# Patient Record
Sex: Female | Born: 1935 | Race: White | Hispanic: No | Marital: Married | Smoking: Former smoker
Health system: Southern US, Community
[De-identification: ages and names within clinical notes are randomized; demographics above are authoritative.]

## PROBLEM LIST (undated history)

## (undated) DIAGNOSIS — R0609 Other forms of dyspnea: Secondary | ICD-10-CM

## (undated) DIAGNOSIS — Z800 Family history of malignant neoplasm of digestive organs: Secondary | ICD-10-CM

## (undated) DIAGNOSIS — C679 Malignant neoplasm of bladder, unspecified: Secondary | ICD-10-CM

## (undated) DIAGNOSIS — R0600 Dyspnea, unspecified: Secondary | ICD-10-CM

## (undated) DIAGNOSIS — J449 Chronic obstructive pulmonary disease, unspecified: Secondary | ICD-10-CM

## (undated) DIAGNOSIS — H409 Unspecified glaucoma: Secondary | ICD-10-CM

## (undated) DIAGNOSIS — I639 Cerebral infarction, unspecified: Secondary | ICD-10-CM

## (undated) DIAGNOSIS — Z853 Personal history of malignant neoplasm of breast: Secondary | ICD-10-CM

## (undated) DIAGNOSIS — K219 Gastro-esophageal reflux disease without esophagitis: Secondary | ICD-10-CM

## (undated) DIAGNOSIS — Z8042 Family history of malignant neoplasm of prostate: Secondary | ICD-10-CM

## (undated) DIAGNOSIS — Z8041 Family history of malignant neoplasm of ovary: Secondary | ICD-10-CM

## (undated) DIAGNOSIS — N200 Calculus of kidney: Secondary | ICD-10-CM

## (undated) DIAGNOSIS — Z8619 Personal history of other infectious and parasitic diseases: Secondary | ICD-10-CM

## (undated) DIAGNOSIS — I619 Nontraumatic intracerebral hemorrhage, unspecified: Secondary | ICD-10-CM

## (undated) DIAGNOSIS — M1990 Unspecified osteoarthritis, unspecified site: Secondary | ICD-10-CM

## (undated) DIAGNOSIS — Z8679 Personal history of other diseases of the circulatory system: Secondary | ICD-10-CM

## (undated) DIAGNOSIS — Z803 Family history of malignant neoplasm of breast: Secondary | ICD-10-CM

## (undated) HISTORY — DX: Calculus of kidney: N20.0

## (undated) HISTORY — PX: TUBAL LIGATION: SHX77

## (undated) HISTORY — DX: Cerebral infarction, unspecified: I63.9

## (undated) HISTORY — PX: CATARACT EXTRACTION W/ INTRAOCULAR LENS  IMPLANT, BILATERAL: SHX1307

## (undated) NOTE — *Deleted (*Deleted)
[place]   [phone] [phone]   Clinic Follow up Note   Patient Care Team: Janith Lima, MD as PCP - General (Internal Medicine) Stanford Breed, Denice Bors, MD as PCP - Cardiology (Cardiology) Rockwell Germany, RN as Oncology Nurse Navigator Mauro Kaufmann, RN as Oncology Nurse Navigator Truitt Merle, MD as Consulting Physician (Hematology) Fanny Skates, MD as Consulting Physician (General Surgery) Kyung Rudd, MD as Consulting Physician (Radiation Oncology) Alexis Frock, MD as Consulting Physician (Anesthesiology) Alexis Frock, MD as Consulting Physician (Urology) [name], MD as Consulting Physician (Cardiology) Rigoberto Noel, MD as Consulting Physician (Pulmonary Disease)  Date of Service:  06/22/2020  CHIEF COMPLAINT: f/u of left breast cancer  SUMMARY OF ONCOLOGIC HISTORY: Oncology History Overview Note  Cancer Staging Malignant neoplasm of upper-outer quadrant of left breast in female, estrogen receptor positive ([place]) Staging form: Breast, AJCC 8th Edition - Clinical stage from 04/01/2019: Stage IA (cT1c, cN0, cM0, G2, ER+, PR+, HER2-) - Signed by Truitt Merle, MD on 04/07/2019 - Pathologic stage from 05/04/2019: Stage Unknown (pT2, pNX, cM0, G2, ER+, PR+, HER2-) - Signed by Truitt Merle, MD on 05/27/2019    Malignant neoplasm of upper-outer quadrant of left breast in female, estrogen receptor positive ([place])  03/30/2019 Mammogram   Left diagnostic mammogram and [place] 03/30/19  IMPRESSION The 1.6cm heterogenous focal area in the left breast at 1:00-2:00 4-6cm form the nipple, corresponding to the hard palpable area in the left breast is suspicious of malignancy, particularly lobular carcinoma.     04/01/2019 Cancer Staging   Staging form: Breast, AJCC 8th Edition - Clinical stage from 04/01/2019: Stage IA (cT1c, cN0, cM0, G2, ER+, PR+, HER2-) - Signed by Truitt Merle, MD on 04/07/2019   04/01/2019 Initial Biopsy   Diagnosis 04/01/19  Breast, left, needle core biopsy, 2 o'clock, 5cmfn - INVASIVE MAMMARY CARCINOMA, SEE COMMENT. - MAMMARY CARCINOMA IN SITU.   04/01/2019 Receptors her2   The tumor cells are NEGATIVE for Her2 (1+). Estrogen Receptor: 100%, POSITIVE, STRONG STAINING INTENSITY Progesterone Receptor: 30%, POSITIVE, STRONG STAINING INTENSITY Proliferation Marker Ki67: 20%   04/03/2019 Initial Diagnosis   Malignant neoplasm of upper-outer quadrant of left breast in female, estrogen receptor positive ([facility])    Genetic Testing   Negative genetic testing. No pathogenic variants identified on the [facility] Breast Cancer STAT Panel + Common Hereditary Cancers Panel. The STAT Breast cancer panel offered by [facility] includes sequencing and rearrangement analysis for the following 9 genes:  ATM, BRCA1, BRCA2, CDH1, CHEK2, PALB2, PTEN, STK11 and TP53.  The Common Hereditary Cancers Panel offered by [facility] includes sequencing and/or deletion duplication testing of the following 47 genes: APC, ATM, AXIN2, BARD1, BMPR1A, BRCA1, BRCA2, BRIP1, CDH1, CDKN2A (p14ARF), CDKN2A (p16INK4a), CKD4, CHEK2, CTNNA1, DICER1, EPCAM (Deletion/duplication testing only), GREM1 (promoter region deletion/duplication testing only), KIT, MEN1, MLH1, MSH2, MSH3, MSH6, MUTYH, NBN, NF1, NHTL1, PALB2, PDGFRA, PMS2, POLD1, POLE, PTEN, RAD50, RAD51C, RAD51D,  SDHB, SDHC, SDHD, SMAD4, SMARCA4. STK11, TP53, TSC1, TSC2, and VHL.  The following genes were evaluated for sequence changes only: SDHA and HOXB13 c.251G>A variant only. The report date is 04/17/2019.    05/04/2019 Surgery    LEFT TOTAL MASTECTOMY by Dr. Dalbert Batman 05/04/19   05/04/2019 Pathology Results   Diagnosis 05/04/19 1. Breast, excision - INVASIVE LOBULAR CARCINOMA, NOTTINGHAM GRADE 2 - SEE COMMENT 2. Breast, simple mastectomy, Left total - MULTIFOCAL, INVASIVE LOBULAR CARCINOMA, NOTTINGHAM GRADE 2 OF 3, 2.1 CM - MARGINS UNINVOLVED BY CARCINOMA (0.2 CM; POSTERIOR MARGIN) -  LYMPHOVASCULAR SPACE  INVASION PRESENT - PREVIOUS BIOPSY SITE CHANGES PRESENT - SEBORRHEIC KERATOSIS - SEE ONCOLOGY TABLE AND COMMENT BELOW    Anti-estrogen oral therapy   She declined antiestrogen therapy.    05/04/2019 Cancer Staging   Staging form: Breast, AJCC 8th Edition - Pathologic stage from 05/04/2019: Stage Unknown (pT2, pNX, cM0, G2, ER+, PR+, HER2-) - Signed by Truitt Merle, MD on 05/27/2019      CURRENT THERAPY:  Surveillance   INTERVAL HISTORY: *** [name] is here for a follow up of left breast cancer. She was last seen by me 1 year ago and seen by NP Lacie 1 months ago in interim. She presents to the clinic alone.    REVIEW OF SYSTEMS:  *** Constitutional: Denies fevers, chills or abnormal weight loss Eyes: Denies blurriness of vision Ears, nose, mouth, throat, and face: Denies mucositis or sore throat Respiratory: Denies cough, dyspnea or wheezes Cardiovascular: Denies palpitation, chest discomfort or lower extremity swelling Gastrointestinal:  Denies nausea, heartburn or change in bowel habits Skin: Denies abnormal skin rashes Lymphatics: Denies new lymphadenopathy or easy bruising Neurological:Denies numbness, tingling or new weaknesses Behavioral/Psych: Mood is stable, no new changes  All other systems were reviewed with the patient and are negative.  MEDICAL HISTORY:  Past Medical History:  Diagnosis Date  . Arthritis   . COPD (chronic obstructive pulmonary disease) ([place])   . Dyspnea on exertion   . GERD (gastroesophageal reflux disease)   . Glaucoma of both eyes   . History of atrial fibrillation without current medication 2010   POST SURG 2010--  PER PT NO ISSUES SINCE  . History of breast cancer    DX DCIS IN 2004--  S/P RIGHT MASTECTOMY , NO CHEMORADIATION---  NO RECURRENCE  . History of CHF (congestive heart failure)    POST SURG 2010  . History of hypertension   . History of shingles    02/ 2015--  back of neck and left flank--  no residual pain  . ICH  (intracerebral hemorrhage) ([place]) 02/05/2015  . Nephrolithiasis   . Recurrent bladder papillary carcinoma ([place]) first dx 06/ 2014   s/p  turbt's  and instillation mitomycin c (chemo)//dx again in 2015-different type  . Stroke [facility])     SURGICAL HISTORY: Past Surgical History:  Procedure Laterality Date  . CATARACT EXTRACTION W/ INTRAOCULAR LENS  IMPLANT, BILATERAL    . CRANIECTOMY N/A 02/05/2015   Procedure: SUBOCCIPITAL CRANIECTOMY EVACUATION OF HEMATOMA;  Surgeon: Consuella Lose, MD;  Location: [place];  Service: Neurosurgery;  Laterality: N/A;  . CYSTOSCOPY W/ RETROGRADES Bilateral 08/11/2014   Procedure: CYSTOSCOPY WITH BILATERAL RETROGRADE PYELOGRAM AND MITOMYCIN INSTILLATION;  Surgeon: Alexis Frock, MD;  Location: [facility];  Service: Urology;  Laterality: Bilateral;  . CYSTOSCOPY W/ URETERAL STENT PLACEMENT Bilateral 03/26/2013   Procedure: CYSTOSCOPY WITH BILATERAL RETROGRADE PYELOGRAM/URETERAL STENT PLACEMENT;  Surgeon: Alexis Frock, MD;  Location: [facility];  Service: Urology;  Laterality: Bilateral;  . CYSTOSCOPY W/ URETERAL STENT PLACEMENT Left 05/13/2013   Procedure: CYSTOSCOPY WITH RETROGRADE PYELOGRAM/URETERAL STENT PLACEMENT STENT EXCHANGE;  Surgeon: Alexis Frock, MD;  Location: [facility];  Service: Urology;  Laterality: Left;  . PARTIAL MASTECTOMY WITH NEEDLE LOCALIZATION Right 01-14-2003   DCIS  . REMOVAL CYST LEFT HAND  2013  . TOTAL KNEE ARTHROPLASTY Right 03-22-2009  . TOTAL MASTECTOMY Right 02-03-2003   W/ SLN BX  AND  POST 03-01-2003 EVACUATION HEMATOMA  . TOTAL MASTECTOMY Left 05/04/2019  Procedure: LEFT TOTAL MASTECTOMY;  Surgeon: Fanny Skates, MD;  Location: [place];  Service: General;  Laterality: Left;  . TRANSTHORACIC ECHOCARDIOGRAM  03-25-2009   MILD LVF/ EF 70-80%/ MILD INCREASE SYSTOLIC PULMONARY  PRESSURE  . TRANSURETHRAL RESECTION OF BLADDER TUMOR WITH GYRUS (TURBT-GYRUS) N/A 03/26/2013    Procedure: TRANSURETHRAL RESECTION OF BLADDER TUMOR WITH GYRUS (TURBT-GYRUS);  Surgeon: Alexis Frock, MD;  Location: [facility];  Service: Urology;  Laterality: N/A;  . TRANSURETHRAL RESECTION OF BLADDER TUMOR WITH GYRUS (TURBT-GYRUS) N/A 05/13/2013   Procedure: TRANSURETHRAL RESECTION OF BLADDER TUMOR WITH GYRUS (TURBT-GYRUS)  RE-STAGING TRANSURETHRAL RESECTION OF BLADDER TUMOR, LEFT RETROGRADE PYELOGRAM AND STENT EXCHANGE;  Surgeon: Alexis Frock, MD;  Location: [facility];  Service: Urology;  Laterality: N/A;  . TRANSURETHRAL RESECTION OF BLADDER TUMOR WITH GYRUS (TURBT-GYRUS) N/A 08/11/2014   Procedure: TRANSURETHRAL RESECTION OF BLADDER TUMOR WITH GYRUS (TURBT-GYRUS);  Surgeon: Alexis Frock, MD;  Location[facility];  Service: Urology;  Laterality: N/A;  . TUBAL LIGATION      I have reviewed the social history and family history with the patient and they are unchanged from previous note.  ALLERGIES:  is allergic to heparin and morphine and related.  MEDICATIONS:  Current Outpatient Medications  Medication Sig Dispense Refill  . albuterol (VENTOLIN HFA) 108 (90 Base) MCG/ACT inhaler Inhale 2 puffs into the lungs every 6 (six) hours as needed for wheezing or shortness of breath. (Patient not taking: Reported on 05/26/2020) 8 g 2  . arformoterol (BROVANA) 15 MCG/2ML NEBU Take 2 mLs (15 mcg total) by nebulization 2 (two) times daily. 120 mL 5  . atorvastatin (LIPITOR) 10 MG tablet TAKE ONE TABLET DAILY AT 6PM 90 tablet 3  . Brinzolamide-Brimonidine (SIMBRINZA) 1-0.2 % SUSP Place 1 drop into both eyes in the morning and at bedtime.    . budesonide (PULMICORT) 0.5 MG/2ML nebulizer solution Take 2 mLs (0.5 mg total) by nebulization in the morning and at bedtime. 120 mL 1  . digoxin (LANOXIN) 0.125 MG tablet TAKE ONE TABLET DAILY 90 tablet 2  . diltiazem (CARDIZEM CD) 120 MG 24 hr capsule Take 1 capsule (120 mg total) by mouth daily. 30 capsule 11   . fluorometholone (FML) 0.1 % ophthalmic suspension Place 1 drop into both eyes daily.    . furosemide (LASIX) 40 MG tablet Take 40 mg by mouth in the morning and 20 mg in the evening    . guaiFENesin (MUCINEX) 600 MG 12 hr tablet Take 600 mg by mouth daily.    Marland Kitchen ipratropium (ATROVENT) 0.02 % nebulizer solution Take 2.5 mLs (0.5 mg total) by nebulization 3 (three) times daily. DX: J44.9 225 mL 5  . latanoprost (XALATAN) 0.005 % ophthalmic solution Place 1 drop into both eyes at bedtime.    . OXYGEN Inhale 2 L into the lungs continuous.     . pantoprazole (PROTONIX) 40 MG tablet TAKE ONE TABLET DAILY 90 tablet 1  . Polyethylene Glycol 3350 (MIRALAX PO) Take 17 g by mouth daily.    . potassium chloride SA (KLOR-CON) 20 MEQ tablet Take 20 meq by mouth in the morning and 10 meq in the evening    . SHINGRIX injection Inject 0.5 mLs into the muscle once.     . valACYclovir (VALTREX) 1000 MG tablet Take 1,000 mg by mouth daily.     No current facility-administered medications for this visit.    PHYSICAL EXAMINATION: ECOG PERFORMANCE STATUS: {CHL ONC ECOG PS:[id number]}  There were no  vitals filed for this visit. There were no vitals filed for this visit. *** GENERAL:alert, no distress and comfortable SKIN: skin color, texture, turgor are normal, no rashes or significant lesions EYES: normal, Conjunctiva are pink and non-injected, sclera clear {OROPHARYNX:no exudate, no erythema and lips, buccal mucosa, and tongue normal}  NECK: supple, thyroid normal size, non-tender, without nodularity LYMPH:  no palpable lymphadenopathy in the cervical, axillary {or inguinal} LUNGS: clear to auscultation and percussion with normal breathing effort HEART: regular rate & rhythm and no murmurs and no lower extremity edema ABDOMEN:abdomen soft, non-tender and normal bowel sounds Musculoskeletal:no cyanosis of digits and no clubbing  NEURO: alert & oriented x 3 with fluent speech, no focal motor/sensory  deficits  LABORATORY DATA:  I have reviewed the data as listed CBC Latest Ref Rng & Units 05/26/2020 12/14/2019 12/10/2019  WBC 4.0 - 10.5 K/uL 8.9 9.0 9.3  Hemoglobin 12.0 - 15.0 g/dL 14.4 13.2 13.5  Hematocrit 36 - 46 % 44.2 43.0 44.1  Platelets 150 - 400 K/uL 235 296 278     CMP Latest Ref Rng & Units 06/14/2020 05/26/2020 12/22/2019  Glucose 65 - 99 mg/dL 92 91 89  BUN 8 - 27 mg/dL _0 Creatinine 0.57 - 1.00 mg/dL 0.84 0.84 0.92  Sodium 134 - 144 mmol/L 140 139 142  Potassium 3.5 - 5.2 mmol/L 4.4 4.1 4.6  Chloride 96 - 106 mmol/L 99 102 100  CO2 20 - 29 mmol/L _1 Calcium 8.7 - 10.3 mg/dL 9.0 9.0 9.2  Total Protein 6.5 - 8.1 g/dL - 6.9 -  Total Bilirubin 0.3 - 1.2 mg/dL - 1.0 -  Alkaline Phos 38 - 126 U/L - 137(H) -  AST 15 - 41 U/L - 21 -  ALT 0 - 44 U/L - 15 -      RADIOGRAPHIC STUDIES: I have personally reviewed the radiological images as listed and agreed with the findings in the report. No results found.   ASSESSMENT & PLAN:  MINKA KNIGHT is a 83 y.o. female with    1Malignant neoplasm of upper-outer quadrant of left breast,lobular carcinoma,StageIApT2cN0M0, ER/PR+, HER2-,Grade II -She was diagnosed in 03/2019. She underwent left totalmastectomyby Dr Dalbert Batman on 05/04/19. -Genetictesting was negative for pathogenetic mutations -Oncotype was not recommend due to her advanced age andmedical comorbidities -Givenearly stage disease,post mastectomy radiation is not recommended.  -Patient declined antiestrogen therapy due to age and medical co-morbidities, onsurveillance -Her 05/26/20 exam with NP Lacie showed nodularity along the left mastectomy site, possibly scar tissue, and faint skin eruption on the chest wall.  ***   2. H/o of right breast DCIS, Dx 2004  -S/p rightlumpectomyby Dr. Floyde Parkins 01/14/2003 then right mastectomy on 02/03/2003, no chemo, RTor AI  3. H/o of Recurrent bladder papillary carcinoma -Initiallydiagnosed on 03/26/13  with high grade papillary urothelial carcinoma -Diagnosedrecurrent low grade papillary urothelial carcinoma on 08/11/14 -underwent transurethral resection of bladder with Gyrus and stent placement.  -She continues to be followed by her Urologist Dr. Tresa Moore   5. H/o of ICH and stroke, S/p craniotomy in 02/2015  6. Comorbidities: HTN, Afib, COPD, Shingles and GERD and arthritis  -Managed by cardiologist Dr. Stanford Breed, PCP and Pulmonologist Dr Elsworth Soho.  -Continue Medications and stay up to date on Shingles Vaccines.   -She has 40 year h/o smoking and quit 18 years ago -On 2L O2 canulasince 2019, On albuterol and inhalers. Stable.    PLAN: *** -Pt declined adjuvant antiestrogen therapy  -Proceed with surveillance.  -  lab and f/u in 1 year    No problem-specific Assessment & Plan notes found for this encounter.   No orders of the defined types were placed in this encounter.  All questions were answered. The patient knows to call the clinic with any problems, questions or concerns. No barriers to learning was detected. The total time spent in the appointment was {CHL ONC TIME VISIT - FBPZW:[id number]}.     [name] 06/22/2020   Oneal Deputy, am acting as scribe for Truitt Merle, MD.   {Add scribe attestation statement}

---
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
## 1998-10-24 ENCOUNTER — Other Ambulatory Visit: Admission: RE | Admit: 1998-10-24 | Discharge: 1998-10-24 | Payer: Self-pay | Admitting: *Deleted

## 1999-11-21 ENCOUNTER — Other Ambulatory Visit: Admission: RE | Admit: 1999-11-21 | Discharge: 1999-11-21 | Payer: Self-pay | Admitting: *Deleted

## 2001-01-14 ENCOUNTER — Other Ambulatory Visit: Admission: RE | Admit: 2001-01-14 | Discharge: 2001-01-14 | Payer: Self-pay | Admitting: *Deleted

## 2002-12-28 ENCOUNTER — Other Ambulatory Visit: Admission: RE | Admit: 2002-12-28 | Discharge: 2002-12-28 | Payer: Self-pay | Admitting: *Deleted

## 2002-12-29 ENCOUNTER — Ambulatory Visit (HOSPITAL_COMMUNITY): Admission: RE | Admit: 2002-12-29 | Discharge: 2002-12-29 | Payer: Self-pay | Admitting: Surgery

## 2002-12-29 ENCOUNTER — Encounter: Payer: Self-pay | Admitting: Surgery

## 2002-12-30 ENCOUNTER — Encounter: Payer: Self-pay | Admitting: Surgery

## 2003-01-14 ENCOUNTER — Encounter (INDEPENDENT_AMBULATORY_CARE_PROVIDER_SITE_OTHER): Payer: Self-pay | Admitting: *Deleted

## 2003-01-14 ENCOUNTER — Ambulatory Visit (HOSPITAL_BASED_OUTPATIENT_CLINIC_OR_DEPARTMENT_OTHER): Admission: RE | Admit: 2003-01-14 | Discharge: 2003-01-14 | Payer: Self-pay | Admitting: Surgery

## 2003-01-14 HISTORY — PX: PARTIAL MASTECTOMY WITH NEEDLE LOCALIZATION: SHX6008

## 2003-02-03 ENCOUNTER — Encounter (INDEPENDENT_AMBULATORY_CARE_PROVIDER_SITE_OTHER): Payer: Self-pay | Admitting: Specialist

## 2003-02-03 ENCOUNTER — Ambulatory Visit (HOSPITAL_BASED_OUTPATIENT_CLINIC_OR_DEPARTMENT_OTHER): Admission: RE | Admit: 2003-02-03 | Discharge: 2003-02-04 | Payer: Self-pay | Admitting: Surgery

## 2003-02-03 ENCOUNTER — Encounter: Payer: Self-pay | Admitting: Surgery

## 2003-02-03 HISTORY — PX: TOTAL MASTECTOMY: SHX6129

## 2003-03-01 ENCOUNTER — Ambulatory Visit (HOSPITAL_BASED_OUTPATIENT_CLINIC_OR_DEPARTMENT_OTHER): Admission: RE | Admit: 2003-03-01 | Discharge: 2003-03-01 | Payer: Self-pay | Admitting: Surgery

## 2004-05-24 ENCOUNTER — Other Ambulatory Visit: Admission: RE | Admit: 2004-05-24 | Discharge: 2004-05-24 | Payer: Self-pay | Admitting: *Deleted

## 2007-05-27 ENCOUNTER — Ambulatory Visit (HOSPITAL_COMMUNITY): Admission: RE | Admit: 2007-05-27 | Discharge: 2007-05-27 | Payer: Self-pay | Admitting: Internal Medicine

## 2007-07-09 ENCOUNTER — Other Ambulatory Visit: Admission: RE | Admit: 2007-07-09 | Discharge: 2007-07-09 | Payer: Self-pay | Admitting: Internal Medicine

## 2008-09-03 DIAGNOSIS — Z8679 Personal history of other diseases of the circulatory system: Secondary | ICD-10-CM

## 2008-09-03 HISTORY — DX: Personal history of other diseases of the circulatory system: Z86.79

## 2009-03-17 ENCOUNTER — Ambulatory Visit: Admission: RE | Admit: 2009-03-17 | Discharge: 2009-03-17 | Payer: Self-pay | Admitting: Internal Medicine

## 2009-03-22 ENCOUNTER — Ambulatory Visit: Payer: Self-pay | Admitting: Cardiology

## 2009-03-22 ENCOUNTER — Inpatient Hospital Stay (HOSPITAL_COMMUNITY): Admission: RE | Admit: 2009-03-22 | Discharge: 2009-03-30 | Payer: Self-pay | Admitting: Orthopedic Surgery

## 2009-03-22 HISTORY — PX: TOTAL KNEE ARTHROPLASTY: SHX125

## 2009-03-25 ENCOUNTER — Encounter: Payer: Self-pay | Admitting: Cardiology

## 2009-03-25 HISTORY — PX: TRANSTHORACIC ECHOCARDIOGRAM: SHX275

## 2010-12-10 LAB — BASIC METABOLIC PANEL WITH GFR
BUN: 14 mg/dL (ref 6–23)
BUN: 14 mg/dL (ref 6–23)
BUN: 8 mg/dL (ref 6–23)
CO2: 26 meq/L (ref 19–32)
CO2: 28 meq/L (ref 19–32)
CO2: 29 meq/L (ref 19–32)
Calcium: 8.1 mg/dL — ABNORMAL LOW (ref 8.4–10.5)
Calcium: 8.4 mg/dL (ref 8.4–10.5)
Calcium: 8.5 mg/dL (ref 8.4–10.5)
Chloride: 101 meq/L (ref 96–112)
Chloride: 102 meq/L (ref 96–112)
Chloride: 98 meq/L (ref 96–112)
Creatinine, Ser: 0.63 mg/dL (ref 0.4–1.2)
Creatinine, Ser: 0.65 mg/dL (ref 0.4–1.2)
Creatinine, Ser: 0.67 mg/dL (ref 0.4–1.2)
GFR calc non Af Amer: >60 mL/min
GFR calc non Af Amer: >60 mL/min
GFR calc non Af Amer: >60 mL/min
Glucose, Bld: 112 mg/dL — ABNORMAL HIGH (ref 70–99)
Glucose, Bld: 119 mg/dL — ABNORMAL HIGH (ref 70–99)
Glucose, Bld: 152 mg/dL — ABNORMAL HIGH (ref 70–99)
Potassium: 4.2 meq/L (ref 3.5–5.1)
Potassium: 4.2 meq/L (ref 3.5–5.1)
Potassium: 4.7 meq/L (ref 3.5–5.1)
Sodium: 132 meq/L — ABNORMAL LOW (ref 135–145)
Sodium: 134 meq/L — ABNORMAL LOW (ref 135–145)
Sodium: 137 meq/L (ref 135–145)

## 2010-12-10 LAB — CK TOTAL AND CKMB (NOT AT ARMC)
CK, MB: 1.5 ng/mL (ref 0.3–4.0)
CK, MB: 1.5 ng/mL (ref 0.3–4.0)
CK, MB: 1.9 ng/mL (ref 0.3–4.0)
CK, MB: 2.7 ng/mL (ref 0.3–4.0)
Relative Index: 3.4 — ABNORMAL HIGH (ref 0.0–2.5)
Relative Index: INVALID (ref 0.0–2.5)
Relative Index: INVALID (ref 0.0–2.5)
Total CK: 52 U/L (ref 7–177)
Total CK: 90 U/L (ref 7–177)

## 2010-12-10 LAB — COMPREHENSIVE METABOLIC PANEL
AST: 21 U/L (ref 0–37)
Albumin: 4.1 g/dL (ref 3.5–5.2)
Alkaline Phosphatase: 92 U/L (ref 39–117)
BUN: 15 mg/dL (ref 6–23)
CO2: 26 mEq/L (ref 19–32)
Chloride: 103 mEq/L (ref 96–112)
Creatinine, Ser: 0.87 mg/dL (ref 0.4–1.2)
GFR calc Af Amer: >60 mL/min (ref >60)
GFR calc non Af Amer: >60 mL/min (ref >60)
Potassium: 3.9 mEq/L (ref 3.5–5.1)
Total Bilirubin: 0.5 mg/dL (ref 0.3–1.2)

## 2010-12-10 LAB — DIFFERENTIAL
Basophils Absolute: 0 10*3/uL (ref 0.0–0.1)
Eosinophils Absolute: 0.4 10*3/uL (ref 0.0–0.7)
Eosinophils Absolute: 0.4 10*3/uL (ref 0.0–0.7)
Eosinophils Relative: 4 % (ref 0–5)
Eosinophils Relative: 4 % (ref 0–5)
Lymphocytes Relative: 20 % (ref 12–46)
Lymphs Abs: 1.4 10*3/uL (ref 0.7–4.0)
Lymphs Abs: 1.9 10*3/uL (ref 0.7–4.0)
Monocytes Absolute: 1.1 10*3/uL — ABNORMAL HIGH (ref 0.1–1.0)
Monocytes Relative: 11 % (ref 3–12)

## 2010-12-10 LAB — CBC
HCT: 30.2 % — ABNORMAL LOW (ref 36.0–46.0)
HCT: 30.6 % — ABNORMAL LOW (ref 36.0–46.0)
HCT: 31 % — ABNORMAL LOW (ref 36.0–46.0)
HCT: 32.7 % — ABNORMAL LOW (ref 36.0–46.0)
HCT: 35.9 % — ABNORMAL LOW (ref 36.0–46.0)
HCT: 41 % (ref 36.0–46.0)
Hemoglobin: 10.4 g/dL — ABNORMAL LOW (ref 12.0–15.0)
Hemoglobin: 10.6 g/dL — ABNORMAL LOW (ref 12.0–15.0)
Hemoglobin: 11.1 g/dL — ABNORMAL LOW (ref 12.0–15.0)
Hemoglobin: 12 g/dL (ref 12.0–15.0)
MCHC: 33.4 g/dL (ref 30.0–36.0)
MCHC: 34.1 g/dL (ref 30.0–36.0)
MCHC: 34.1 g/dL (ref 30.0–36.0)
MCV: 94.9 fL (ref 78.0–100.0)
MCV: 95.2 fL (ref 78.0–100.0)
MCV: 95.5 fL (ref 78.0–100.0)
MCV: 95.7 fL (ref 78.0–100.0)
MCV: 95.9 fL (ref 78.0–100.0)
MCV: 96.4 fL (ref 78.0–100.0)
Platelets: 160 10*3/uL (ref 150–400)
Platelets: 177 10*3/uL (ref 150–400)
Platelets: 191 10*3/uL (ref 150–400)
Platelets: 194 10*3/uL (ref 150–400)
Platelets: 242 10*3/uL (ref 150–400)
RBC: 3.17 MIL/uL — ABNORMAL LOW (ref 3.87–5.11)
RBC: 3.26 MIL/uL — ABNORMAL LOW (ref 3.87–5.11)
RBC: 3.41 MIL/uL — ABNORMAL LOW (ref 3.87–5.11)
RBC: 3.75 MIL/uL — ABNORMAL LOW (ref 3.87–5.11)
RBC: 4.33 MIL/uL (ref 3.87–5.11)
RDW: 12.9 % (ref 11.5–15.5)
RDW: 13 % (ref 11.5–15.5)
RDW: 13 % (ref 11.5–15.5)
RDW: 13.3 % (ref 11.5–15.5)
WBC: 10.4 10*3/uL (ref 4.0–10.5)
WBC: 11.4 10*3/uL — ABNORMAL HIGH (ref 4.0–10.5)
WBC: 12.7 10*3/uL — ABNORMAL HIGH (ref 4.0–10.5)
WBC: 9.9 10*3/uL (ref 4.0–10.5)

## 2010-12-10 LAB — BASIC METABOLIC PANEL
BUN: 14 mg/dL (ref 6–23)
CO2: 28 mEq/L (ref 19–32)
Calcium: 8.2 mg/dL — ABNORMAL LOW (ref 8.4–10.5)
Calcium: 8.6 mg/dL (ref 8.4–10.5)
Chloride: 101 mEq/L (ref 96–112)
Chloride: 101 mEq/L (ref 96–112)
Creatinine, Ser: 0.57 mg/dL (ref 0.4–1.2)
Creatinine, Ser: 0.63 mg/dL (ref 0.4–1.2)
GFR calc non Af Amer: >60 mL/min (ref >60)
Glucose, Bld: 95 mg/dL (ref 70–99)
Glucose, Bld: 96 mg/dL (ref 70–99)
Glucose, Bld: 99 mg/dL (ref 70–99)
Potassium: 4.1 mEq/L (ref 3.5–5.1)
Sodium: 137 mEq/L (ref 135–145)

## 2010-12-10 LAB — PROTIME-INR
INR: 1 (ref 0.00–1.49)
INR: 2.2 — ABNORMAL HIGH (ref 0.00–1.49)
INR: 2.3 — ABNORMAL HIGH (ref 0.00–1.49)
INR: 2.4 — ABNORMAL HIGH (ref 0.00–1.49)
Prothrombin Time: 13.9 s (ref 11.6–15.2)
Prothrombin Time: 25.7 s — ABNORMAL HIGH (ref 11.6–15.2)
Prothrombin Time: 27.1 s — ABNORMAL HIGH (ref 11.6–15.2)
Prothrombin Time: 28.1 s — ABNORMAL HIGH (ref 11.6–15.2)
Prothrombin Time: 28.8 seconds — ABNORMAL HIGH (ref 11.6–15.2)

## 2010-12-10 LAB — DIGOXIN LEVEL: Digoxin Level: 0.2 ng/mL — ABNORMAL LOW (ref 0.8–2.0)

## 2010-12-10 LAB — URINALYSIS, ROUTINE W REFLEX MICROSCOPIC
Bilirubin Urine: NEGATIVE
Hgb urine dipstick: NEGATIVE
Ketones, ur: NEGATIVE mg/dL
Specific Gravity, Urine: 1.026 (ref 1.005–1.030)
pH: 7 (ref 5.0–8.0)

## 2010-12-10 LAB — LIPID PANEL
Cholesterol: 148 mg/dL (ref 0–200)
HDL: 30 mg/dL — ABNORMAL LOW (ref >39)
Total CHOL/HDL Ratio: 4.9 RATIO
Triglycerides: 120 mg/dL (ref <150)

## 2010-12-10 LAB — BRAIN NATRIURETIC PEPTIDE: Pro B Natriuretic peptide (BNP): 204 pg/mL — ABNORMAL HIGH (ref 0.0–100.0)

## 2010-12-10 LAB — TYPE AND SCREEN: Antibody Screen: NEGATIVE

## 2010-12-10 LAB — TROPONIN I

## 2010-12-10 LAB — APTT: aPTT: 31 seconds (ref 24–37)

## 2011-01-16 NOTE — Op Note (Signed)
NAME:  Susan, [name] NO.:  [id number]   MEDICAL RECORD NO.:  [id number]          PATIENT TYPE:  INP   LOCATION:  1606                         FACILITY:  [facility]   PHYSICIAN:  Gaynelle Arabian, M.D.    DATE OF BIRTH:  [birth year]   DATE OF PROCEDURE:  03/22/2009  DATE OF DISCHARGE:                               OPERATIVE REPORT   PREOPERATIVE DIAGNOSIS:  Osteoarthritis, right knee.   POSTOPERATIVE DIAGNOSIS:  Osteoarthritis, right knee.   PROCEDURE:  Right total knee arthroplasty.   SURGEON:  Gaynelle Arabian, M.D.   ASSISTANT:  Arlee Muslim, PA-C   ANESTHESIA:  Spinal with Duramorph.   ESTIMATED BLOOD LOSS:  Minimal.   DRAINS:  None.   TOURNIQUET TIME:  35 minutes at 300 mmHg.   COMPLICATIONS:  None.   CONDITION:  Stable to recovery.   CLINICAL HISTORY:  Ms. Dudley is a 76 year old female with end-stage  arthritis of the right knee with progressively worsening pain and  dysfunction.  She has failed nonoperative management and presents now  for right total knee arthroplasty.   PROCEDURE IN DETAIL:  After successful administration of spinal  anesthetic, a tourniquet is placed high on the right thigh and right  lower extremity prepped and draped in the usual sterile fashion.  Extremity is wrapped in Esmarch, knee flexed, tourniquet inflated to 300  mmHg.  Midline incision is made with a 10-blade through subcutaneous  tissue to the level of the extensor mechanism.  A fresh blade is used  make a medial parapatellar arthrotomy.  Soft tissue on the proximal  medial tibia is subperiosteally elevated to the joint line with the  knife and into the semimembranosus bursa with a Cobb elevator.  Soft  tissue laterally is elevated with attention being paid to avoid the  patellar tendon on tibial tubercle.  Patella is subluxed laterally, knee  flexed 90 degrees and ACL and PCL removed.  Drill was used create a  starting hole in the distal femur and the canal was  thoroughly  irrigated.  The 5-degree right valgus alignment guide is placed,  referencing off the posterior condyles, rotations marked the blocks  pinned to remove 11 mm off the distal femur.  I took 11 because of a  preoperative flexion contracture.  Distal femoral resection is made with  an oscillating saw.  Sizing blocks placed and size 4 is most appropriate  in the anterior-posterior plane and 3 most appropriate mediolateral.  Thus, a 4 narrow was the size.  The size 4 cutting block is then placed  with rotation marked off the epicondylar axis.  The anterior, posterior  and chamfer cuts are made.   The tibia is subluxed forward and the menisci are removed.  The  extramedullary tibial alignment guide is placed, referencing proximally  at the medial aspect of the tibial tubercle and distally along the  second metatarsal axis and tibial crest.  Block is pinned to remove  about 2 mm off the more deficient medial side.  Tibial resection is made  with an oscillating saw.  Size 3  is the most appropriate tibial  component and the proximal tibia is prepared with the modular drill and  the keel punch for the size 3.  Femoral preparation is completed with  the intercondylar cut to the size 4.   Size 3 mobile bearing tibial trial, size 4 posterior stabilized femoral  trial and a 12.5-mm posterior stabilized rotating platform insert trial  are placed.  With the 12.5, she hyperextends a tiny bit so I went to 15  which allowed for full extension with excellent varus-valgus and  anterior-posterior balance throughout full range of motion.  The patella  is everted and thickness measured to be 24 mm.  Freehand resection is  taken to 14 mm, a 38 template is placed, lug holes are drilled, trial  patella is placed and it tracks normally.  Osteophytes are removed off  the posterior femur with the trial in place.   All trials are removed and the cut bone surfaces are prepared with  pulsatile lavage.   Cement is mixed and, once ready for implantation, the  size 3 mobile bearing tibial tray, size 4 narrow posterior stabilized  femur and a 38 patella are cemented into place.  The patella is held  with a clamp.  Trial 10-mm insert is placed, knee held in full extension  and all extruded cement removed.  When cement is fully hardened, then  the wound is copiously irrigated with saline solution and the FloSeal  injected on the posterior capsule, medial and lateral gutters and  suprapatellar area.  Moist sponge is placed and tourniquet released for  total time of 35 minutes.  Sponge is held for 2 minutes, then removed.  Minimal bleeding is encountered.  The bleeding that is encountered is  stopped with electrocautery.  The wound is then further irrigated to  remove the FloSeal and then the arthrotomy closed with interrupted #1  PDS.  The flexion against gravity is approximately 140 degrees.  Subcu  is closed with interrupted 2-0 Vicryl, subcuticular running 4-0  Monocryl.  The incision is then cleaned and dried and Steri-Strips and a  bulky sterile dressing are applied.  She is placed into a knee  immobilizer, awakened and transferred to recovery in stable condition.      Gaynelle Arabian, M.D.  Electronically Signed     FA/MEDQ  D:  03/22/2009  T:  03/23/2009  Job:  [id number]

## 2011-01-16 NOTE — Consult Note (Signed)
NAME:  [name], [name] NO.:  [id number]   MEDICAL RECORD NO.:  [id number]          PATIENT TYPE:  INP   LOCATION:  1416                         FACILITY:  [facility]   PHYSICIAN:  Erma Pinto, MD       DATE OF BIRTH:  [birth year]   DATE OF CONSULTATION:  DATE OF DISCHARGE:                                 CONSULTATION   REQUESTING PHYSICIAN:  Dr. Gaynelle Arabian.   REASON FOR CONSULT:  Abnormal chest x-ray and medical management of COPD  and pneumonia.   HISTORY OF PRESENT ILLNESS:  Patient postoperatively March 22, 2009,  developed new onset atrial fib with rapid ventricular response.  Cardiology consult was obtained but the patient has not yet been seen by  Cardiology.  She was treated with digoxin, Cardizem drip, and Lovenox  and converted Friday night or March 25, 2009, back to sinus rhythm.  Initial cardiac enzymes were abnormal in terms of the CK-MB index being  elevated initially and the troponins being elevated x3 but the total CPK  number was in the normal range which is questionable whether she had  mild subendocardial cardiac event or just abnormal laboratory findings.  Patient became dyspneic and was having shortness of breath.  Chest x-ray  showed bilateral pleural effusions, questionable infiltrate or  atelectasis, and a CT of the chest was ordered which ruled out pulmonary  embolus.  This showed emphysema and chronic obstructive pulmonary  changes and also questionable right basilar infiltrate.  Patient was  given Lasix last night and she had good response and she is feeling  better.  Currently, she has some mild respiratory distress but is  feeling better.   PAST MEDICAL HISTORY:  Significant for:  1. Chronic obstructive pulmonary disease.  2. She has had breast cancer, May 2010 or 2009.  She has had several      surgeries for breast cancer on the right.  3. She has a history of hypertension.  4. Arthritis.   PAST SURGICAL HISTORY:  1. She had the  breast surgeries x3 on the right breast.  2. She has had total knee replacement done on March 22, 2009.   MEDICATIONS:  Currently are:  1. Coumadin which is being dosed by pharmacy.  2. DOS 100 mg twice a day.  3. Flovent metered-dose inhaler twice a day.  4. Benicar 10 mg daily.  5. Protonix 40 mg daily.  6. She is on normal saline IV saline lock.  7. She was given digoxin 0.125 daily.  8. Cardizem 30 mg every 6 hours.  9. She is having mechanical VT prophylaxis.  10.She is on Robaxin 500 mg every 6 hours as needed.  11.Ambien 5 mg at bedtime as needed.  12.Fleet Enema as needed.  13.Tylenol 325 one or 2 tablets every 4 hours as needed.  14.Percocet 5/325 one every 4 hours as needed.  15.Zofran 4 mg IV every 6 hours as needed.  16.Dulcolax Suppository 10 mg daily as needed.   SHE HAS NO KNOWN DRUG ALLERGIES.   EXAM:  She is a well-developed, well-nourished, Caucasian female, 76  years of  age, appears stated age, in mild respiratory distress, slightly  tachypneic and using accessory muscles of respirations, on 4 L of O2.  Saturations 96.  Her blood pressure is 125/67.  Temperature 98.9.  Pulse  83.  Respirations 20.  HEAD, EYES, EARS, NOSE, AND THROAT:  Normal.  NECK:  Supple without jugular venous distention, thyromegaly, or thyroid  mass.  CHEST:  She has some rare wheezing.  Decreased breath sounds  bilaterally, right greater than left.  HEART:  Regular rhythm and rate.  Normal S1 and S2 without murmur,  gallop, or rub.  ABDOMEN:  Soft and nontender with normoactive bowel sounds.  No  hepatomegaly.  No splenomegaly.  No palpable mass.  EXTREMITIES:  There is no clubbing, cyanosis, or edema.   LABS:  White count 10,400, hemoglobin 10.3, hematocrit 30.6.  Pro time  is 28.8 today and INR is 2.5.  Electrolytes, sodium 138 mEq/L, potassium  4.1 mEq/L, chloride 101 mEq/L, CO2 is 30 mEq/L, BUN is 14 mg/dL,  creatinine 0.5, glucose was normal, 110 mg/dL.  Her CPK total was   normal.  Troponin was elevated x3 and initial CPK index was elevated.  EKG showed atrial fib with rapid ventricular response and incomplete  bundle branch block.  Patient's current rhythm strip shows sinus rhythm.  Chest x-ray showed COPD with bibasilar airspace disease and question of  pneumonia versus atelectasis.  CT angio showed no pulmonary embolus,  COPD, and emphysema, bibasilar pleural effusions, right greater than  left, questionable right basilar infiltrate.   ASSESSMENT:  1. New onset atrial fib postop paroxysmal currently converted.  2. Abnormal cardiac enzymes, etiology uncertain, may be subendocardial      myocardial infarction versus bleb abnormality.  3. Bilateral pleural effusions secondary to fluid overload postop,      responding well to Lasix.  4. Bilateral pleural effusions with questionable right basilar      infiltrate.   PLAN:  1. Make sure the patient is seen by Cardiology to address her recent      atrial fibrillation and the questionable abnormal enzymes.  Have      the patient continue with Lasix IV 40 mg daily.  2. Strict I's and O's and monitor fluid balance.  3. Start the patient on Zosyn IV, pharmacy to dose.  4. Discontinue metered-dose inhaler and start Xopenex 0.63 unit dose      via nebs every 8 hours, Humibid LA b.i.d. orally, and obtain an EKG      today and in the morning.  Cardiac enzymes in the morning.  Chest x-      ray in the morning.  Lipid profile in the morning.  Echocardiogram      and basic metabolic panel in the morning and CBC in the morning.      Patient will be followed by [facility], [facility].      Erma Pinto, MD  Electronically Signed     ML/MEDQ  D:  03/27/2009  T:  03/27/2009  Job:  [id number]

## 2011-01-16 NOTE — H&P (Signed)
NAME:  [name], [name] NO.:  [id number]   MEDICAL RECORD NO.:  [id number]          PATIENT TYPE:  INP   LOCATION:  NA                           FACILITY:  [facility]   PHYSICIAN:  Gaynelle Arabian, M.D.    DATE OF BIRTH:  1935/07/14   DATE OF ADMISSION:  DATE OF DISCHARGE:                              HISTORY & PHYSICAL   CHIEF COMPLAINT:  Right knee pain.   HISTORY OF PRESENT ILLNESS:  The patient has been followed by Dr.  Wynelle Link for worsening right knee pain.  Patient has had injections in  the right knee in the past with minimal benefit.  The patient states  that the knee pain is worsening to the point that it is preventing her  from doing things.  Patient presents to have right total knee  arthroplasty.   PAST HISTORY:  1. COPD.  2. Breast cancer.  It was her right side, so NO IV IN HER RIGHT ARM.  3. Hypertension.  4. Arthritis.   ALLERGIES:  No known drug allergies.   CURRENT MEDICATIONS:  1. Flovent 220 mcg b.i.d.  2. Albuterol inhaler.  3. Micardis 20 mg daily.  4. Aspirin 81 mg daily.  5. Multivitamin daily.  6. Caltrate.   SURGICAL HISTORY:  Includes 3 breast cancer related surgeries that were  in May and June of 2004.   FAMILY HISTORY:  Father passed at age 82 of emphysema and congestive  heart failure.  Mother passed at age 81 of colon cancer.   SOCIAL HISTORY:  Patient is married.  She is a housewife.  Patient is  not a smoker.  Patient is not a drinker.  Patient has 4 children.  Patient does have family support and family help lined up for after  surgery, so she will be able to return to her home.   REVIEW OF SYSTEMS:  GENERAL:  No fevers, chills or night sweats.  HEENT/NEURO:  No headache, no blurred vision, no dentures, no paralysis.  RESPIRATORY:  Positive for shortness of breath on exertion and wheezing  due to patient's history of COPD.  CARDIOVASCULAR:  No chest pain, no  palpitations and no swelling in the ankles.  GI:  No nausea,  no  vomiting, no diarrhea, no blood in the stool.  GU:  No painful  urination, no frequent urinary tract infections, no blood in the urine.  MUSCULOSKELETAL:  Positive for joint pain in the knees bilaterally, more  so in the right.  Negative for back pain, negative for muscular  weakness.   PHYSICAL EXAMINATION:  VITAL SIGNS:  Pulse 76, respirations 16, blood  pressure 130/70.  GENERAL:  A 76 year old female no acute distress, alert and oriented x3.  She is a good historian.  HEENT:  Unremarkable.  NECK:  Supple, full range of motion.  Negative for thyromegaly.  Negative for lymphadenopathy.  CHEST:  Decreased breath sounds bilaterally without wheezes, rales or  rhonchi.  HEART:  Regular rate and rhythm, without murmur or palpitations.  ABDOMEN:  Soft, nontender, positive bowel sounds.  EXTREMITIES:  Negative for effusion in the right knee, positive for  Baker's cyst behind the right knee and positive for a small painless  nodule on the medial portion of the right knee.  PERIPHERAL VASCULAR:  Negative for carotid bruit.  Carotid pulses 2+.  Radial pulses 2+ bilaterally.  Dorsalis pedis pulses 2+ bilaterally.  NEUROLOGIC:  No neurologic deficit appreciated.   IMPRESSION:  End-stage arthritis of right knee.   PLAN:  Patient to be admitted to [facility] to undergo right  total knee surgery, this will be performed by Dr. Gaynelle Arabian.      Toniann Fail, [facility]      Gaynelle Arabian, M.D.  Electronically Signed    LD/MEDQ  D:  03/20/2009  T:  03/20/2009  Job:  [id number]

## 2011-01-16 NOTE — H&P (Signed)
NAME:  [name], [name] NO.:  [id number]   MEDICAL RECORD NO.:  [id number]          PATIENT TYPE:  INP   LOCATION:  NA                           FACILITY[facility]  [facility]   PHYSICIAN:  Toniann Fail, [facility]    DATE OF BIRTH:  [birth year]   DATE OF ADMISSION:  03/17/2009  DATE OF DISCHARGE:  03/17/2009                              HISTORY & PHYSICAL   __________      Toniann Fail, PAC     LD/MEDQ  D:  03/20/2009  T:  03/20/2009  Job:  [id number]

## 2011-01-16 NOTE — Consult Note (Signed)
NAME:  [name], [name] NO.:  [id number]   MEDICAL RECORD NO.:  [id number]          PATIENT TYPE:  OUT   LOCATION:  CARD                         FACILITY:  [facility]   PHYSICIAN:  Minus Breeding, MD, FACCDATE OF BIRTH:  12-20-1934   DATE OF CONSULTATION:  03/25/2009  DATE OF DISCHARGE:                                 CONSULTATION   The requesting is Gaynelle Arabian.  The primary is Dr. Tommy Medal.   REASON FOR CONSULTATION:  Evaluate patient with atrial fibrillation.   HISTORY OF PRESENT ILLNESS:  The patient is a lovely 76 year old white  female without prior cardiac history.  She is now status post right knee  replacement.  She is noted to be in atrial fibrillation.  It is not  clear exactly when this started as she was not on telemetry.  However,  her heart rate was 101 on the morning of the 22nd.  Surgery was on the  21st.  Of note, she has been therapeutic with an INR 2.3 since the  morning of a 22nd.  She is not really noticing any palpitations.  She  does not notice this at home.  She never had any presyncope or syncope.  She is a little bit limited by her knee pain, but she can work out on a  recumbent bike.  With this, she does not describe any palpitations.  She  does not have any chest pressure, neck or arm discomfort.  She has some  mild dyspnea and has a long previous smoking history.  She has been told  she had some COPD.  However, she does not describe any resting shortness  of breath and has no PND or orthopnea.  She does have very mildly  elevated troponin, though a total normal CK-MB.  There is some lateral  ST depression with the rapid rate.   PAST MEDICAL HISTORY:  1. COPD.  2. Hypertension.  3. Breast cancer.  4. Previous tobacco use.   PAST SURGICAL HISTORY:  1. Right total knee replacement.  2. Right mastectomy.   ALLERGIES:  None.   MEDICATIONS:  1. Colace.  2. Flovent.  3. Benicar 10 mg.  4. Protonix.  5. Coumadin.   SOCIAL  HISTORY:  Lives in [place] her husband.  She has 4 children.  She quit smoking in 2007 after 40-pack years.  She does not drink  alcohol.   FAMILY HISTORY:  Is noncontributory for early coronary artery disease.   REVIEW OF SYSTEMS:  Positive for seasonal allergies, rare wheezing, rare  gastroesophageal reflux disease.  Otherwise negative for all other  systems.   PHYSICAL EXAMINATION:  The patient is quite pleasant and in no distress.  Blood pressure 109/70, heart rate 148 and irregular, respiratory rate  16, 92% saturation on 4 liters, afebrile.  HEENT:  Eyelids unremarkable.  Pupils equal, round and reactive to  light.  Fundi not visualized.  Oral mucosa unremarkable.  NECK:  No jugular distention at 45 degrees, carotid upstroke brisk and  symmetrical.  No bruits, thyromegaly.  LYMPHATICS:  No cervical, axillary, or inguinal adenopathy.  LUNGS:  Clear to  auscultation bilaterally.  BACK:  No costovertebral tenderness.  CHEST:  Unremarkable.  HEART:  PMI not displaced or sustained, S1-S2 within normal limits.  No  S3.  No clicks, no rubs, no murmurs.  ABDOMEN:  Flat, positive bowel sounds, normal in frequency and pitch, no  bruits, no rebound, no guarding, no midline pulsatile mass, no  hepatomegaly, no splenomegaly.  SKIN:  No rashes, no nodules.  EXTREMITIES:  2+ pulses.  No cyanosis, no clubbing, no edema.  NEUROLOGICAL:  Oriented to person, place, and time.  Cranial nerves  grossly intact.  Motor grossly intact.   LABORATORY DATA:  INR 2.4.  Hemoglobin 10.6, WBC 13.2, platelets 156.  Sodium 137, potassium 4.2, BUN 8, creatinine 0.63.  CK total 170, MB  3.6, troponin 0.4.   EKG as above.   ASSESSMENT AND PLAN:  1. Atrial fibrillation.  The patient has had atrial fibrillation with      rapid rate.  She is currently on a Cardizem drip and has gotten 0.5      of digoxin.  Her rate is now slow.  However, she did not tolerate a      higher dose of Cardizem and will have to  be careful titrating this,      watching her blood pressure.  She is on therapeutic      anticoagulation, and I believe she has been on therapeutic      anticoagulation since she went into fibrillation.  She clearly was      not in fibrillation on the evening of the 21st at 9:35.  She may      have been fibrillation early on July 22 or later in the day on 722,      but her INR was therapeutic at that point.  Therefore, if she does      not convert on her own, she could be cardioverted without risk of      thromboembolism.  She is going to be here through the weekend.  I      would keep her n.p.o. after midnight if she is still on      fibrillation for possible cardioversion Monday morning.  2. Non-Q-wave myocardial function.  The patient has had a slight      troponin elevation.  However, I do not suspect a primary coronary      event.  Given the ST-segment changes and the troponin elevation,      she should at least have      a stress perfusion study which could be done as an outpatient.  3. Follow-up.  We will clearly need to follow her after her      hospitalization.  She will certainly be discharged on Coumadin and      will make decisions about needing to keep this long-term or not.      Minus Breeding, MD, [facility]  Electronically Signed     JH/MEDQ  D:  03/25/2009  T:  03/25/2009  Job:  [id number]   cc:   Gaynelle Arabian, M.D.  Fax: [phone]   Tommy Medal, M.D.  Fax: [phone]

## 2011-01-19 NOTE — Op Note (Signed)
NAME:  [name], [name]                       ACCOUNT NO.:  [id number]   MEDICAL RECORD NO.:  [id number]                   PATIENT TYPE:  AMB   LOCATION:  DSC                                  FACILITY:  [facility]   PHYSICIAN:  Haywood Lasso, M.D.           DATE OF BIRTH:  Dec 24, 1934   DATE OF PROCEDURE:  03/01/2003  DATE OF DISCHARGE:                                 OPERATIVE REPORT   PREOPERATIVE DIAGNOSIS:  Hematoma, mastectomy site.   POSTOPERATIVE DIAGNOSIS:  Hematoma, mastectomy site.   OPERATION PERFORMED:  Evacuation of hematoma.   SURGEON:  Haywood Lasso, M.D.   ANESTHESIA:  General.   INDICATIONS FOR PROCEDURE:  The patient is a 76 year old lady status post  mastectomy which went relatively smoothly.  She was seen postoperatively and  her drains were pulled out.  Subsequent to that she developed seroma which  was evacuated without difficulty.  A couple of days later she pulled  stretching and felt something tear under the mastectomy flap and developed a  marked swelling.  She was seen by Dr. March Rummage last week and thought to have a  hematoma and aspiration revealed really no significant amount of blood.  She  is followed up today and there was no significant improvement with what  appeared to be a fairly large sized hematoma.  The patient had made herself  n.p.o. so that if we needed to do something operatively, that could be done  and we scheduled her to come to the operating room this afternoon.  The  patient was seen in the holding area and had no further questions.   DESCRIPTION OF PROCEDURE:  The patient was taken to the operating room and  after satisfactory general anesthesia had been obtained, the right  mastectomy site was completely prepped and draped.  The old staples were  removed and several hundred cc's of hematoma with a little bit of liquefied  blood was removed.  Some of the clot was still adherent to the flaps and  this was debrided off.   There was no active bleeding and I could not  actually see any source to account for the delayed postoperative bleed.  I  irrigated first with saline, then with some antibiotic solution.  Once  everything appeared to be dry, I put a 19 Blake drain in and secured it with  a 3-0 nylon.  I took several 3-0 Vicryls and tacked the flaps down to the  chest wall to discourage further seroma formations and had a much more  irritated raw surface.  Once that was done, the skin was closed with  staples.  I left about 73m of 0.25% Marcaine under the flaps for about 10  minutes following completion of the case to see if that would have with  postoperative analgesia.   The patient tolerated the procedure well.  There were no operative  complications.  There was essentially no blood loss  for the procedure  although there were probably 252m of clot present.                                                CHaywood Lasso M.D.    CJS/MEDQ  D:  03/01/2003  T:  03/01/2003  Job:  [id number]

## 2011-01-19 NOTE — Discharge Summary (Signed)
NAME:  [name], [name] NO.:  [id number]   MEDICAL RECORD NO.:  [id number]          PATIENT TYPE:  INP   LOCATION:  1609                         FACILITY:  [facility]   PHYSICIAN:  Gaynelle Arabian, M.D.    DATE OF BIRTH:  22-Jan-1935   DATE OF ADMISSION:  03/22/2009  DATE OF DISCHARGE:  03/30/2009                               DISCHARGE SUMMARY   ADMITTING DIAGNOSES:  1. End-stage osteoarthritis, right knee.  2. Chronic obstructive pulmonary disease.  3. History of breast cancer.  4. Hypertension.  5. Arthritis.   DISCHARGE DIAGNOSES:  1. Osteoarthritis, right knee status post right total knee replacement      arthroplasty.  2. Postoperative atrial fibrillation with rapid ventricular response.  3. Postoperative right lower lobe pneumonia.  4. Postoperative hyponatremia, improved.  5. Chronic obstructive pulmonary disease.  6. History of breast cancer.  7. Hypertension.  8. Arthritis.   PROCEDURE:  On March 22, 2009,  right total knee.  Surgeon:  Gaynelle Arabian, M.D.  Assistant:  Alexzandrew L. Perkins, P.A.C.  Anesthesia  was spinal with Duramorph added.  Tourniquet time of 35 minutes.   CONSULTANTS:  1. Cardiology consult, Minus Breeding, MD, [facility] with [place].  2. Medicine consult,  Dr. Eugenio Hoes with [facility].   BRIEF HISTORY:  Ms. Mazurek is a 76 year old female with end-stage  arthritis of the right knee, progressive worsening pain and dysfunction  who failed operative management who now presents for a total knee  arthroplasty.   LABORATORY DATA:  Preop CBC showed hemoglobin of 14, hematocrit of 41,  white cell count 10.4, platelets 194.  Postop hemoglobin 12 then 11.1  and got as low as 10.3 were it stabilized.  Last H and H, back up 10.4  with a hematocrit of 30.2.  PT/PTT on admission showed PT of 12.9, PTT  of 31, INR of 1.  Serial pro time were followed per Coumadin protocol.  PT/INR 23.1 and 1.9.  Serial white counts were followed with the  serial  CBC.  White count started out normal at 10.4.  It went up to 13.2, back  down to normal level of 9.9.  Chem panel on admission all within normal  limits.  Serial BMET were followed.  Sodium did drop from 139 to 133 at  lowest point of 132 and back up to 138.  The remaining electrolytes  remained within normal limits.  Cardiac enzymes taken ended up having  five sets.  Three sets were done on July 23, one set was done on July 24  and one set was done on July 26, are as follows:  The first set of  cardiac enzymes, CK normal at 107, CK-MB normal at 3.6, index high at  3.4.  Troponin high at 0.4.  The second set on July 23, showed CK normal  at 90, CK-MB normal at 27.  Troponin slightly elevated still 0.31.  The  third set taken on March 25, 2009, showed CK normal at 72, CK-MB normal  at 1.9.  Troponin was down to 0.29.  The fourth set taken on July 24, CK  normal at 52, CK-MB normal at 1.5.  Troponin still trending downward to  0.24.  The fifth and final set of cardiac enzymes taken on March 28, 2009, showed CK still normal at 33, CK-MB normal at 1.5.  Troponin was  as at its lowest point at 0.07.  B-type natriuretic peptide taken on  July 24, was elevated 204 and on July 25, it was normal at 55.8.  Cholesterol panel taken on July 26, shows cholesterol of 148, normal  triglycerides normal at 120, HDL was low at 30, VLDL normal at 24,  cholesterol HDL ratio normal at 4.9, LDL was high normal at 94.  One DIG  level taken on March 30, 2009, DIG low at 0.2.  Preop UA cloudy, trace  leukocyte esterase, few epithelial, otherwise negative.  Blood group  type O positive.  EKG dated  March 17, 2009, normal sinus rhythm,  nonspecific ST and T-wave abnormalities, no previous tracing, confirmed  by Quincy Carnes, MD.  EKG dated on March 26, 2009, atrial fibrillation,  rapid ventricular response, incomplete right bundle-branch-block, ST and  T-wave abnormality, consider lateral ischemia or digitalis  effect.  EKG  follow-up on March 27, 2009, normal sinus rhythm.  Septal infarct of age  undetermined,  no significant change since last tracing, confirmed by  Carlean Jews, M.D.  X-rays:  Portable chest on March 25, 2009,  bilateral infrahilar and lower lobe atelectasis.  Follow-up portable  chest on March 26, 2009, COPD with bibasilar air space disease.  This may  be due to pneumonia or atelectasis and is unchanged.  CT angio March 26, 2009, no pulmonary emboli, bilateral effusions, slightly larger on the  right than the left with lower lobe dependent atelectasis and/or  pneumonia, background pattern of central lobe or emphysema.  There could  possibly be a degree of interstitial edema fluid overload as well in  this case.  Portable chest on March 27, 2009, progression of perihilar  airspace disease, especially on the right.  This may be due to pulmonary  edema or pneumonia.  Two-view chest on March 28, 2009, mild congestive  heart failure, suspect right lower lobe pneumonia.  Portable chest on  March 30, 2009, improved aeration since previous study.   HOSPITAL COURSE:  The patient was admitted [facility] and  taken to the OR, underwent the above-stated procedure without  complication.  The patient tolerated the procedure well, later  transferred to recovery room and the orthopedic floor, given 24 hours  postop IV antibiotics, placed on Coumadin for DVT prophylaxis.  She had  some intermittent nausea the night of surgery, but doing a little bit  better on the morning of day #1.  Sodium was a little low.  Started back  on her home meds, Micardis and put on parameters.  Blood pressure was a  little on the softer side.  She had actually got up and walked about 25  feet on day #1.  By day #2, doing a little bit better and nausea had  improved.  Pressure was stable.  Had excellent urinary output.  Hemoglobin looked good.  Sodium was already back up.  We discontinued  the Foley.   Dressing was changed.  Incision looked good.  Unfortunately,  on the morning of day #3, the patient was found to have some  tachycardia.  EKG was ordered and the patient was found to have a rate  of 150 and found to be in atrial fib with  rapid ventricular response.  She did not give [place] any history of previous arrhythmias.  It appeared to  be a new onset.  She denied any chest pain or shortness of breath, no  palpitations.  Her medical physician is Tommy Medal, M.D., but wanted  to [facility].  They were contacted.  The patient was seen and  evaluated by [facility] found to have atrial fib with rapid  ventricular response.  Tried digoxin, IV Cardizem and titrate.  Transferred to step-down for evaluation.  Felt that she would go ahead  and do outpatient stress Myoview at that point.  After administration of  cardiac meds per [place], she was under good rate control by later that  evening and had converted sometime that night.  By day #4, she was back  into regular rhythm, rate controlled.  Once she was rate-controlled, her  therapy which was held initially was resumed.  She therapeutic on her  INR by that time up to 2.2.  Unfortunately, by day #5,  she had  developed a little bit of wheezing and chest x-ray  was taken at that point.  She initially had a chest x-ray of after the  onset of the atrial fib and had some lower lobe atelectasis   Dictation ended at this point.      Alexzandrew L. Perkins, P.A.C.      Gaynelle Arabian, M.D.  Electronically Signed    ALP/MEDQ  D:  05/05/2009  T:  05/05/2009  Job:  [id number]   cc:   Tommy Medal, M.D.  Fax: Ong, MD, [address]  [place]  [place] [address]   Eugenio Hoes, Dr.  [facility]

## 2011-01-19 NOTE — Op Note (Signed)
NAME:  [name], [name]                       ACCOUNT NO.:  [id number]   MEDICAL RECORD NO.:  [id number]                   PATIENT TYPE:  AMB   LOCATION:  DSC                                  FACILITY:  [facility]   PHYSICIAN:  Haywood Lasso, M.D.           DATE OF BIRTH:  [birth year]   DATE OF PROCEDURE:  01/14/2003  DATE OF DISCHARGE:                                 OPERATIVE REPORT   OFFICE MEDICAL RECORD NUMBER:  [facility] [phone].   PREOPERATIVE DIAGNOSIS:  Ductal carcinoma in situ, right breast, upper inner  quadrant.   POSTOPERATIVE DIAGNOSIS:  Ductal carcinoma in situ, right breast, upper  inner quadrant.   OPERATION:  Needle-guided wide excision (partial mastectomy), ductal  carcinoma in situ, right breast.   SURGEON:  Haywood Lasso, M.D.   ANESTHESIA:  General.   CLINICAL HISTORY:  This patient had an area of calcifications and a palpably  abnormal area in the upper inner quadrant of the right breast which were  biopsied and was found to have some DCIS.  No invasive cancer was seen but  there was a fairly extensive area of calcifications noted, primarily in a  radial direction about the 3 o'clock or 2 o'clock of the right breast.  After a lengthy discussion with the patient, she elected to proceed to  needle-guided wide local excision with the understanding that we might have  to come back and do a mastectomy because of the wide expansive  calcifications might involve too much breast cancer to be satisfactory  treated with local excision.   DESCRIPTION OF PROCEDURE:  The patient was seen in the holding area and she  had no further questions.  The right breast was marked as the operative  side.  She already had guide wires placed by Dr. Hamilton Capri, placing four to  well outline and delineate the area that needed to be excised.  The patient  was taken to the operating room and after satisfactory general anesthesia  had been obtained, the breast was prepped and draped.   The guide wire was  entered in a radial fashion starting at about the areolar margin at the 2  o'clock position and then going medially almost to the edge of the sternum.  They all tracked from medial to lateral.  I made an elliptical incision to  encompass all four guide wires.  Once that was done, I then divided  subcutaneous tissue down about a centimeter starting medially and then  working inferiorly.  Once that was done, I then divided the breast tissue  down to the chest wall, first medially and then extending my incision out  further inferiorly to get a wide inferior margin, again down to the chest  wall and worked my way along the inferior margin until I got to the level of  the nipple areolar complex.  I then raised a skin flap about a centimeter  depth superiorly and then  again went up so that we got into breast tissue  and went a couple of centimeters or more above the skin incision to go  through the breast.  There was marked fibrocystic changes throughout the  breast but I could not tell if there was any DCIS or anything that looked  suspicious for invasive cancer.  I then worked that away until I got to the  nipple and since the guide wire was tracked down to the nipple, I then got  breast tissue from under the nipple, again going all the way down to the  chest wall.  There was a palpable, fairly hard area but she also had some  cysts and I was not sure whether this was tumor or cyst, but I felt we would  leave that to the pathologist and we will at any rate well around it.  Bleeders were controlled with the coag cautery.  The specimen was sent for  specimen mammogram and Dr. __________ reported the calcifications were well  within the specimen and appeared to have all of them out.  I felt that I  really could not get any significant additional tissue out without  significant deformity of the breast and elected not to try to get any  further margins, deciding to wait until we  had permanents available for  final decisions.  I injected some Marcaine to help with postop pain relief.  I put a large clip to mark the anterior and posterior margins of the  excision and then smaller clips to mark the periphery.  I closed the deeper  breast tissue a little bit to try to cover the muscle somewhat and then a  little bit of the subcu with some 3-0 Vicryl followed by 4-0 Monocryl  subcuticular plus Steri-Strips.  The patient tolerated the procedure well.  There were no operative complications.  All counts were correct.                                                  Haywood Lasso, M.D.    CJS/MEDQ  D:  01/14/2003  T:  01/15/2003  Job:  [id number]   cc:   Haywood Pao, M.D.  [address]  [place]  [place] [address]  Fax: [phone]

## 2011-01-19 NOTE — Op Note (Signed)
NAME:  [name], [name]                       ACCOUNT NO.:  [id number]   MEDICAL RECORD NO.:  [id number]                   PATIENT TYPE:  AMB   LOCATION:  DSC                                  FACILITY:  [facility]   PHYSICIAN:  Haywood Lasso, M.D.           DATE OF BIRTH:  [birth year]   DATE OF PROCEDURE:  02/03/2003  DATE OF DISCHARGE:                                 OPERATIVE REPORT   PREOPERATIVE DIAGNOSIS:  Ductal carcinoma in situ upper inner quadrant left  breast.   POSTOPERATIVE DIAGNOSIS:  Ductal carcinoma in situ upper inner quadrant left  breast.   OPERATION:  Left total mastectomy with sentinel node biopsy.   SURGEON:  Haywood Lasso, M.D.   ASSISTANT:  Abbott Pao. March Rummage, M.D.   ANESTHESIA:  General.   CLINICAL HISTORY:  This patient has presented recently with a diffuse area  of calcifications and excisional biopsy showed DCIS diffuse through what was  basically a partial mastectomy.  I did not feel we were going to be able to  get any further tissue and achieve any cosmetic results and was concerned  that even doing a larger lumpectomy on top of the one that was already done  would be unsuccessful in achieving margins.  The patient elected, therefore,  to go ahead with a mastectomy and decided not to have an immediate  reconstruction.   DESCRIPTION OF PROCEDURE:  The patient was seen in the holding area and had  no further questions.  The  right side was identified as the operative side.  She was taken to the operating room and after satisfactory general  anesthesia had been obtained, the breast was prepped and draped.  About 4 cm  of a methylene blue saline mixture was injected subareolarly and massaged  in.  The Neoprobe identified a hot area in the right axilla.  An elliptical  incision was outlined to encompass the prior medial incision and then the  superior part of that incision was made.  A skin flap was raised medially to  the sternum, superiorly  to the clavicle, and laterally until we elevated the  axillary skin to the level where the sentinel node was.  I saw a blue  lymphatic heading in that dissection and a little brief dissection showed a  blue node and this was grasped with a Babcock and excised and was hot with  counts up to 3000.  Using the Neoprobe, I found another hot area and a few  more seconds of dissection showed a second deep blue node with a lymphatic  running into it.  This was, likewise, excised.  I put tiny clips on the  lymphatics, but most of the excision was done here with the cautery.  Upon  doing this, there were some other soft, slightly large nodes, but these were  left alone since we had a diagnosis of DCIS and I felt these were probably  reactive  from the biopsy.  The nodes were sent for fresh prep.   Meanwhile, the inferior incision was made and skin flaps were raised, again  medially towards the sternum, inferiorly to the rectus sheath, and laterally  to the latissimus .  The breast was removed from the underlying muscle  taking the fascia with the specimen, starting medially and working  laterally.  We disconnected the final lateral attachments to the latissimus  and into the axilla.  A few bleeders had to be tied but most were controlled  with cautery.  Very little bleeding occurred.  The wound was irrigated and  everything appeared to be dry.  I put two 19 Blake drains in and secured  them with nylon.  A final check was made for hemostasis and the wound was  closed with staples.  I left  about 30 mL of 0.25% Marcaine under the flaps for about ten minutes at the  end of the case to see if that would help with postoperative analgesia.  The  patient tolerated the procedure well.  There were no operative  complications.  All counts were correct.                                               Haywood Lasso, M.D.    CJS/MEDQ  D:  02/03/2003  T:  02/03/2003  Job:  [id number]   cc:   Haywood Pao, M.D.  [address]  [place]  [place] [address]  Fax: [phone]

## 2011-09-04 HISTORY — PX: OTHER SURGICAL HISTORY: SHX169

## 2011-09-05 DIAGNOSIS — M5137 Other intervertebral disc degeneration, lumbosacral region: Secondary | ICD-10-CM | POA: Diagnosis not present

## 2011-09-10 DIAGNOSIS — M5137 Other intervertebral disc degeneration, lumbosacral region: Secondary | ICD-10-CM | POA: Diagnosis not present

## 2011-09-12 DIAGNOSIS — H409 Unspecified glaucoma: Secondary | ICD-10-CM | POA: Diagnosis not present

## 2011-09-12 DIAGNOSIS — IMO0002 Reserved for concepts with insufficient information to code with codable children: Secondary | ICD-10-CM | POA: Diagnosis not present

## 2011-09-12 DIAGNOSIS — H2589 Other age-related cataract: Secondary | ICD-10-CM | POA: Diagnosis not present

## 2011-09-12 DIAGNOSIS — H2510 Age-related nuclear cataract, unspecified eye: Secondary | ICD-10-CM | POA: Diagnosis not present

## 2011-10-04 DIAGNOSIS — Z0000 Encounter for general adult medical examination without abnormal findings: Secondary | ICD-10-CM | POA: Diagnosis not present

## 2011-10-04 DIAGNOSIS — E559 Vitamin D deficiency, unspecified: Secondary | ICD-10-CM | POA: Diagnosis not present

## 2011-10-04 DIAGNOSIS — E7800 Pure hypercholesterolemia, unspecified: Secondary | ICD-10-CM | POA: Diagnosis not present

## 2011-10-09 DIAGNOSIS — R300 Dysuria: Secondary | ICD-10-CM | POA: Diagnosis not present

## 2011-10-09 DIAGNOSIS — Z23 Encounter for immunization: Secondary | ICD-10-CM | POA: Diagnosis not present

## 2011-10-09 DIAGNOSIS — IMO0001 Reserved for inherently not codable concepts without codable children: Secondary | ICD-10-CM | POA: Diagnosis not present

## 2011-10-11 DIAGNOSIS — Z01419 Encounter for gynecological examination (general) (routine) without abnormal findings: Secondary | ICD-10-CM | POA: Diagnosis not present

## 2011-10-11 DIAGNOSIS — Z9189 Other specified personal risk factors, not elsewhere classified: Secondary | ICD-10-CM | POA: Diagnosis not present

## 2011-10-11 DIAGNOSIS — Z124 Encounter for screening for malignant neoplasm of cervix: Secondary | ICD-10-CM | POA: Diagnosis not present

## 2011-10-15 DIAGNOSIS — M79609 Pain in unspecified limb: Secondary | ICD-10-CM | POA: Diagnosis not present

## 2011-11-06 DIAGNOSIS — M79609 Pain in unspecified limb: Secondary | ICD-10-CM | POA: Diagnosis not present

## 2011-12-04 DIAGNOSIS — R000 Tachycardia, unspecified: Secondary | ICD-10-CM | POA: Diagnosis not present

## 2011-12-04 DIAGNOSIS — J029 Acute pharyngitis, unspecified: Secondary | ICD-10-CM | POA: Diagnosis not present

## 2011-12-11 DIAGNOSIS — M79609 Pain in unspecified limb: Secondary | ICD-10-CM | POA: Diagnosis not present

## 2011-12-17 DIAGNOSIS — M65849 Other synovitis and tenosynovitis, unspecified hand: Secondary | ICD-10-CM | POA: Diagnosis not present

## 2011-12-17 DIAGNOSIS — M65839 Other synovitis and tenosynovitis, unspecified forearm: Secondary | ICD-10-CM | POA: Diagnosis not present

## 2012-02-12 ENCOUNTER — Other Ambulatory Visit: Payer: Self-pay | Admitting: Internal Medicine

## 2012-02-12 DIAGNOSIS — H40019 Open angle with borderline findings, low risk, unspecified eye: Secondary | ICD-10-CM | POA: Diagnosis not present

## 2012-02-12 DIAGNOSIS — R1310 Dysphagia, unspecified: Secondary | ICD-10-CM | POA: Diagnosis not present

## 2012-02-13 ENCOUNTER — Ambulatory Visit
Admission: RE | Admit: 2012-02-13 | Discharge: 2012-02-13 | Disposition: A | Discharge status: Home / Self Care | Payer: Medicare Other | Source: Ambulatory Visit | Attending: Internal Medicine | Admitting: Internal Medicine

## 2012-02-13 DIAGNOSIS — R1310 Dysphagia, unspecified: Secondary | ICD-10-CM | POA: Diagnosis not present

## 2012-02-13 DIAGNOSIS — K219 Gastro-esophageal reflux disease without esophagitis: Secondary | ICD-10-CM | POA: Diagnosis not present

## 2012-02-26 DIAGNOSIS — J309 Allergic rhinitis, unspecified: Secondary | ICD-10-CM | POA: Diagnosis not present

## 2012-02-26 DIAGNOSIS — K219 Gastro-esophageal reflux disease without esophagitis: Secondary | ICD-10-CM | POA: Diagnosis not present

## 2012-04-07 DIAGNOSIS — Z0000 Encounter for general adult medical examination without abnormal findings: Secondary | ICD-10-CM | POA: Diagnosis not present

## 2012-04-07 DIAGNOSIS — M949 Disorder of cartilage, unspecified: Secondary | ICD-10-CM | POA: Diagnosis not present

## 2012-04-07 DIAGNOSIS — E559 Vitamin D deficiency, unspecified: Secondary | ICD-10-CM | POA: Diagnosis not present

## 2012-04-07 DIAGNOSIS — K219 Gastro-esophageal reflux disease without esophagitis: Secondary | ICD-10-CM | POA: Diagnosis not present

## 2012-05-01 DIAGNOSIS — M65849 Other synovitis and tenosynovitis, unspecified hand: Secondary | ICD-10-CM | POA: Diagnosis not present

## 2012-05-01 DIAGNOSIS — M65839 Other synovitis and tenosynovitis, unspecified forearm: Secondary | ICD-10-CM | POA: Diagnosis not present

## 2012-06-19 DIAGNOSIS — M65839 Other synovitis and tenosynovitis, unspecified forearm: Secondary | ICD-10-CM | POA: Diagnosis not present

## 2012-06-23 DIAGNOSIS — Z23 Encounter for immunization: Secondary | ICD-10-CM | POA: Diagnosis not present

## 2012-07-15 DIAGNOSIS — M5137 Other intervertebral disc degeneration, lumbosacral region: Secondary | ICD-10-CM | POA: Diagnosis not present

## 2012-08-04 DIAGNOSIS — Z1231 Encounter for screening mammogram for malignant neoplasm of breast: Secondary | ICD-10-CM | POA: Diagnosis not present

## 2012-08-11 DIAGNOSIS — H52209 Unspecified astigmatism, unspecified eye: Secondary | ICD-10-CM | POA: Diagnosis not present

## 2012-08-11 DIAGNOSIS — H04129 Dry eye syndrome of unspecified lacrimal gland: Secondary | ICD-10-CM | POA: Diagnosis not present

## 2012-08-11 DIAGNOSIS — H40019 Open angle with borderline findings, low risk, unspecified eye: Secondary | ICD-10-CM | POA: Diagnosis not present

## 2012-08-11 DIAGNOSIS — Z961 Presence of intraocular lens: Secondary | ICD-10-CM | POA: Diagnosis not present

## 2012-10-09 DIAGNOSIS — E7800 Pure hypercholesterolemia, unspecified: Secondary | ICD-10-CM | POA: Diagnosis not present

## 2012-10-09 DIAGNOSIS — E559 Vitamin D deficiency, unspecified: Secondary | ICD-10-CM | POA: Diagnosis not present

## 2012-10-09 DIAGNOSIS — I10 Essential (primary) hypertension: Secondary | ICD-10-CM | POA: Diagnosis not present

## 2012-10-09 DIAGNOSIS — K219 Gastro-esophageal reflux disease without esophagitis: Secondary | ICD-10-CM | POA: Diagnosis not present

## 2012-10-13 DIAGNOSIS — Z124 Encounter for screening for malignant neoplasm of cervix: Secondary | ICD-10-CM | POA: Diagnosis not present

## 2012-10-13 DIAGNOSIS — Z01419 Encounter for gynecological examination (general) (routine) without abnormal findings: Secondary | ICD-10-CM | POA: Diagnosis not present

## 2012-10-14 DIAGNOSIS — M949 Disorder of cartilage, unspecified: Secondary | ICD-10-CM | POA: Diagnosis not present

## 2012-10-14 DIAGNOSIS — Z0000 Encounter for general adult medical examination without abnormal findings: Secondary | ICD-10-CM | POA: Diagnosis not present

## 2012-10-14 DIAGNOSIS — R319 Hematuria, unspecified: Secondary | ICD-10-CM | POA: Diagnosis not present

## 2012-10-14 DIAGNOSIS — M899 Disorder of bone, unspecified: Secondary | ICD-10-CM | POA: Diagnosis not present

## 2012-10-14 DIAGNOSIS — H6120 Impacted cerumen, unspecified ear: Secondary | ICD-10-CM | POA: Diagnosis not present

## 2012-11-13 LAB — HM COLONOSCOPY

## 2013-02-05 DIAGNOSIS — R310 Gross hematuria: Secondary | ICD-10-CM | POA: Diagnosis not present

## 2013-02-05 DIAGNOSIS — R109 Unspecified abdominal pain: Secondary | ICD-10-CM | POA: Diagnosis not present

## 2013-02-09 DIAGNOSIS — D3500 Benign neoplasm of unspecified adrenal gland: Secondary | ICD-10-CM | POA: Diagnosis not present

## 2013-02-09 DIAGNOSIS — R310 Gross hematuria: Secondary | ICD-10-CM | POA: Diagnosis not present

## 2013-02-09 DIAGNOSIS — N200 Calculus of kidney: Secondary | ICD-10-CM | POA: Diagnosis not present

## 2013-02-10 DIAGNOSIS — J40 Bronchitis, not specified as acute or chronic: Secondary | ICD-10-CM | POA: Diagnosis not present

## 2013-02-23 DIAGNOSIS — R310 Gross hematuria: Secondary | ICD-10-CM | POA: Diagnosis not present

## 2013-02-23 DIAGNOSIS — N201 Calculus of ureter: Secondary | ICD-10-CM | POA: Diagnosis not present

## 2013-02-23 DIAGNOSIS — R3989 Other symptoms and signs involving the genitourinary system: Secondary | ICD-10-CM | POA: Diagnosis not present

## 2013-02-23 DIAGNOSIS — J189 Pneumonia, unspecified organism: Secondary | ICD-10-CM | POA: Diagnosis not present

## 2013-03-16 DIAGNOSIS — J40 Bronchitis, not specified as acute or chronic: Secondary | ICD-10-CM | POA: Diagnosis not present

## 2013-03-16 DIAGNOSIS — J189 Pneumonia, unspecified organism: Secondary | ICD-10-CM | POA: Diagnosis not present

## 2013-03-17 DIAGNOSIS — H40019 Open angle with borderline findings, low risk, unspecified eye: Secondary | ICD-10-CM | POA: Diagnosis not present

## 2013-03-18 ENCOUNTER — Other Ambulatory Visit: Payer: Self-pay | Admitting: Urology

## 2013-03-19 ENCOUNTER — Encounter (HOSPITAL_BASED_OUTPATIENT_CLINIC_OR_DEPARTMENT_OTHER): Payer: Self-pay | Admitting: *Deleted

## 2013-03-19 NOTE — Progress Notes (Signed)
NPO AFTER MN. ARRIVES AT 1000. NEEDS ISTAT 8, EKG , AND CXR. WILL BRING INHALER.

## 2013-03-26 ENCOUNTER — Ambulatory Visit (HOSPITAL_BASED_OUTPATIENT_CLINIC_OR_DEPARTMENT_OTHER): Payer: Medicare Other | Admitting: Anesthesiology

## 2013-03-26 ENCOUNTER — Other Ambulatory Visit: Payer: Self-pay

## 2013-03-26 ENCOUNTER — Ambulatory Visit (HOSPITAL_BASED_OUTPATIENT_CLINIC_OR_DEPARTMENT_OTHER)
Admission: RE | Admit: 2013-03-26 | Discharge: 2013-03-26 | Disposition: A | Discharge status: Home / Self Care | Payer: Medicare Other | Source: Ambulatory Visit | Attending: Urology | Admitting: Urology

## 2013-03-26 ENCOUNTER — Encounter (HOSPITAL_BASED_OUTPATIENT_CLINIC_OR_DEPARTMENT_OTHER): Payer: Self-pay | Admitting: Anesthesiology

## 2013-03-26 ENCOUNTER — Encounter (HOSPITAL_BASED_OUTPATIENT_CLINIC_OR_DEPARTMENT_OTHER)
Admission: RE | Disposition: A | Discharge status: Home / Self Care | Payer: Self-pay | Source: Ambulatory Visit | Attending: Urology

## 2013-03-26 ENCOUNTER — Ambulatory Visit (HOSPITAL_COMMUNITY): Payer: Medicare Other

## 2013-03-26 ENCOUNTER — Encounter (HOSPITAL_BASED_OUTPATIENT_CLINIC_OR_DEPARTMENT_OTHER): Payer: Self-pay | Admitting: *Deleted

## 2013-03-26 DIAGNOSIS — D494 Neoplasm of unspecified behavior of bladder: Secondary | ICD-10-CM | POA: Diagnosis not present

## 2013-03-26 DIAGNOSIS — Z853 Personal history of malignant neoplasm of breast: Secondary | ICD-10-CM | POA: Insufficient documentation

## 2013-03-26 DIAGNOSIS — K219 Gastro-esophageal reflux disease without esophagitis: Secondary | ICD-10-CM | POA: Diagnosis not present

## 2013-03-26 DIAGNOSIS — C679 Malignant neoplasm of bladder, unspecified: Secondary | ICD-10-CM | POA: Insufficient documentation

## 2013-03-26 DIAGNOSIS — J449 Chronic obstructive pulmonary disease, unspecified: Secondary | ICD-10-CM | POA: Insufficient documentation

## 2013-03-26 DIAGNOSIS — N3020 Other chronic cystitis without hematuria: Secondary | ICD-10-CM | POA: Diagnosis not present

## 2013-03-26 DIAGNOSIS — N200 Calculus of kidney: Secondary | ICD-10-CM | POA: Diagnosis not present

## 2013-03-26 DIAGNOSIS — Z9010 Acquired absence of unspecified breast and nipple: Secondary | ICD-10-CM | POA: Diagnosis not present

## 2013-03-26 DIAGNOSIS — I10 Essential (primary) hypertension: Secondary | ICD-10-CM | POA: Diagnosis not present

## 2013-03-26 DIAGNOSIS — J9819 Other pulmonary collapse: Secondary | ICD-10-CM | POA: Diagnosis not present

## 2013-03-26 DIAGNOSIS — Z466 Encounter for fitting and adjustment of urinary device: Secondary | ICD-10-CM | POA: Diagnosis not present

## 2013-03-26 DIAGNOSIS — J4489 Other specified chronic obstructive pulmonary disease: Secondary | ICD-10-CM | POA: Insufficient documentation

## 2013-03-26 HISTORY — DX: Other forms of dyspnea: R06.09

## 2013-03-26 HISTORY — PX: CYSTOSCOPY W/ URETERAL STENT PLACEMENT: SHX1429

## 2013-03-26 HISTORY — DX: Personal history of malignant neoplasm of breast: Z85.3

## 2013-03-26 HISTORY — DX: Gastro-esophageal reflux disease without esophagitis: K21.9

## 2013-03-26 HISTORY — DX: Personal history of other diseases of the circulatory system: Z86.79

## 2013-03-26 HISTORY — DX: Dyspnea, unspecified: R06.00

## 2013-03-26 HISTORY — PX: TRANSURETHRAL RESECTION OF BLADDER TUMOR WITH GYRUS (TURBT-GYRUS): SHX6458

## 2013-03-26 HISTORY — DX: Unspecified glaucoma: H40.9

## 2013-03-26 HISTORY — DX: Unspecified osteoarthritis, unspecified site: M19.90

## 2013-03-26 HISTORY — DX: Chronic obstructive pulmonary disease, unspecified: J44.9

## 2013-03-26 LAB — POCT I-STAT, CHEM 8
BUN: 21 mg/dL (ref 6–23)
Creatinine, Ser: 0.7 mg/dL (ref 0.50–1.10)
Hemoglobin: 15.3 g/dL — ABNORMAL HIGH (ref 12.0–15.0)
Potassium: 4 mEq/L (ref 3.5–5.1)
Sodium: 143 mEq/L (ref 135–145)
TCO2: 29 mmol/L (ref 0–100)

## 2013-03-26 SURGERY — TRANSURETHRAL RESECTION OF BLADDER TUMOR WITH GYRUS (TURBT-GYRUS)
Anesthesia: General | Wound class: Clean Contaminated

## 2013-03-26 MED ORDER — SENNOSIDES-DOCUSATE SODIUM 8.6-50 MG PO TABS: 1.0000 | ORAL_TABLET | Freq: Two times a day (BID) | ORAL | Status: DC

## 2013-03-26 MED ORDER — SULFAMETHOXAZOLE-TMP DS 800-160 MG PO TABS: 1.0000 | ORAL_TABLET | Freq: Every day | ORAL | Status: DC

## 2013-03-26 MED ORDER — ONDANSETRON HCL 4 MG/2ML IJ SOLN
INTRAMUSCULAR | Status: DC | PRN
  Administered 2013-03-26: 4 mg via INTRAVENOUS

## 2013-03-26 MED ORDER — GENTAMICIN IN SALINE 1.6-0.9 MG/ML-% IV SOLN
80.0000 mg | INTRAVENOUS | Status: AC
  Administered 2013-03-26: 360 mg via INTRAVENOUS
  Filled 2013-03-26: qty 50

## 2013-03-26 MED ORDER — TRAMADOL HCL 50 MG PO TABS: 50.0000 mg | ORAL_TABLET | Freq: Four times a day (QID) | ORAL | Status: DC | PRN

## 2013-03-26 MED ORDER — LACTATED RINGERS IV SOLN
INTRAVENOUS | Status: DC
  Administered 2013-03-26: 11:00:00 via INTRAVENOUS
  Filled 2013-03-26: qty 1000

## 2013-03-26 MED ORDER — SUCCINYLCHOLINE CHLORIDE 20 MG/ML IJ SOLN
INTRAMUSCULAR | Status: DC | PRN
  Administered 2013-03-26: 40 mg via INTRAVENOUS
  Administered 2013-03-26: 60 mg via INTRAVENOUS

## 2013-03-26 MED ORDER — SODIUM CHLORIDE 0.9 % IR SOLN
Status: DC | PRN
  Administered 2013-03-26: 6000 mL via INTRAVESICAL

## 2013-03-26 MED ORDER — IOHEXOL 300 MG/ML  SOLN
INTRAMUSCULAR | Status: DC | PRN
  Administered 2013-03-26: 20 mL via INTRAVENOUS

## 2013-03-26 MED ORDER — DEXAMETHASONE SODIUM PHOSPHATE 4 MG/ML IJ SOLN
INTRAMUSCULAR | Status: DC | PRN
  Administered 2013-03-26: 10 mg via INTRAVENOUS

## 2013-03-26 MED ORDER — FENTANYL CITRATE 0.05 MG/ML IJ SOLN
INTRAMUSCULAR | Status: DC | PRN
  Administered 2013-03-26 (×2): 25 ug via INTRAVENOUS

## 2013-03-26 MED ORDER — FENTANYL CITRATE 0.05 MG/ML IJ SOLN
25.0000 ug | INTRAMUSCULAR | Status: DC | PRN
  Filled 2013-03-26: qty 1

## 2013-03-26 MED ORDER — PROPOFOL 10 MG/ML IV BOLUS
INTRAVENOUS | Status: DC | PRN
  Administered 2013-03-26: 150 mg via INTRAVENOUS

## 2013-03-26 MED ORDER — LIDOCAINE HCL (CARDIAC) 20 MG/ML IV SOLN
INTRAVENOUS | Status: DC | PRN
  Administered 2013-03-26: 50 mg via INTRAVENOUS

## 2013-03-26 SURGICAL SUPPLY — 18 items
BAG URO CATCHER STRL LF (DRAPE) ×3 IMPLANT
CATH INTERMIT  6FR 70CM (CATHETERS) ×1 IMPLANT
CLOTH BEACON ORANGE TIMEOUT ST (SAFETY) ×3 IMPLANT
DRAPE CAMERA CLOSED 9X96 (DRAPES) ×3 IMPLANT
ELECT LOOP MED HF 24F 12D CBL (CLIP) ×1 IMPLANT
GLOVE BIO SURGEON STRL SZ 6.5 (GLOVE) ×2 IMPLANT
GLOVE BIO SURGEON STRL SZ7.5 (GLOVE) ×3 IMPLANT
GLOVE ECLIPSE 6.5 STRL STRAW (GLOVE) ×1 IMPLANT
GOWN PREVENTION PLUS XLARGE (GOWN DISPOSABLE) ×3 IMPLANT
GOWN STRL NON-REIN LRG LVL3 (GOWN DISPOSABLE) ×6 IMPLANT
GUIDEWIRE ANG ZIPWIRE 038X150 (WIRE) ×3 IMPLANT
GUIDEWIRE STR DUAL SENSOR (WIRE) ×1 IMPLANT
IV NS IRRIG 3000ML ARTHROMATIC (IV SOLUTION) ×4 IMPLANT
PACK CYSTOSCOPY (CUSTOM PROCEDURE TRAY) ×3 IMPLANT
SET ASPIRATION TUBING (TUBING) ×1 IMPLANT
STENT URET 6FRX24 CONTOUR (STENTS) ×1 IMPLANT
SYRINGE 10CC LL (SYRINGE) ×3 IMPLANT
TUBE FEEDING 8FR 16IN STR KANG (MISCELLANEOUS) ×1 IMPLANT

## 2013-03-26 NOTE — Anesthesia Procedure Notes (Signed)
Procedure Name: LMA Insertion Date/Time: 03/26/2013 11:52 AM Performed by: Bethena Roys T Pre-anesthesia Checklist: Patient identified, Emergency Drugs available, Suction available and Patient being monitored Patient Re-evaluated:Patient Re-evaluated prior to inductionOxygen Delivery Method: Circle System Utilized Preoxygenation: Pre-oxygenation with 100% oxygen Intubation Type: IV induction Ventilation: Mask ventilation without difficulty LMA: LMA inserted LMA Size: 4.0 Number of attempts: 1 Airway Equipment and Method: bite block Placement Confirmation: positive ETCO2 Dental Injury: Teeth and Oropharynx as per pre-operative assessment

## 2013-03-26 NOTE — Anesthesia Postprocedure Evaluation (Signed)
Anesthesia Post Note  Patient: [name]  Procedure(s) Performed: Procedure(s) (LRB): TRANSURETHRAL RESECTION OF BLADDER TUMOR WITH GYRUS (TURBT-GYRUS) (N/A) CYSTOSCOPY WITH BILATERAL RETROGRADE PYELOGRAM/URETERAL STENT PLACEMENT (Bilateral)  Anesthesia type: General  Patient location: PACU  Post pain: Pain level controlled  Post assessment: Post-op Vital signs reviewed  Last Vitals:  Filed Vitals:   03/26/13 1314  BP: 121/52  Pulse: 58  Temp:   Resp:     Post vital signs: Reviewed  Level of consciousness: sedated  Complications: No apparent anesthesia complications

## 2013-03-26 NOTE — H&P (Signed)
[name] is an 78 y.o. female.    Chief Complaint: Pre-Op Transurethral Resection of Bladder Tumor  HPI:   1 - Gross Hematuria / Bladder Cancer - Pt with several quarter-sizd papillary tumors lateral to left UO by cysto 02/2013 on w/u gross hematuria on/off since 12/2012. Prior 40PY smoker. CT Urogram 02/2013 with left UVJ thickened area and punctate left lower pole stone. She had been recomended to have cysto and eval 2012 but did not keep appt.  2 - Nephrolithiasis - Had left ureteral stone 2012 managed wtih medical therapy. CT 2014 with punctate left lower pole stone only.  PMH sig for breast CA (mastectomy), Knee surgery. COPD (No O2). No CV disease. No strong blood thinners.  Today [name] is seen to proceed with transurethral resection of bladder tumor. She has medical clearance and denies interval fevers. Most recent UA without infectious parameters.   Past Medical History  Diagnosis Date  . History of CHF (congestive heart failure)     POST SURG 2010  . Bladder cancer   . Hypertension   . COPD (chronic obstructive pulmonary disease)   . History of breast cancer     DX DCIS IN 2004--  S/P RIGHT MASTECTOMY , NO CHEMORADIATION---  NO RECURRENCE  . Glaucoma of both eyes   . History of atrial fibrillation without current medication     POST SURG 2010--  PER PT NO ISSUES SINCE  . Arthritis   . Dyspnea on exertion   . GERD (gastroesophageal reflux disease)     Past Surgical History  Procedure Laterality Date  . Total knee arthroplasty Right 03-22-2009  . Partial mastectomy with needle localization Right 01-14-2003    DCIS  . Total mastectomy Right 02-03-2003    W/ SLN BX  AND  POST 03-01-2003 EVACUATION HEMATOMA  . Removal cyst left hand  2013  . Transthoracic echocardiogram  03-25-2009    MILD LVF/ EF 70-80%/ MILD INCREASE SYSTOLIC PULMONARY  PRESSURE    History reviewed. No pertinent family history. Social History:  reports that she quit smoking about 10 years  ago. Her smoking use included Cigarettes. She has a 40 pack-year smoking history. She has never used smokeless tobacco. She reports that she does not drink alcohol or use illicit drugs.  Allergies: No Known Allergies  No prescriptions prior to admission    No results found for this or any previous visit (from the past 48 hour(s)). No results found.  Review of Systems  Constitutional: Negative.  Negative for fever and chills.  HENT: Negative.   Eyes: Negative.   Respiratory: Negative.   Cardiovascular: Negative.   Gastrointestinal: Negative.   Genitourinary: Negative.   Musculoskeletal: Negative.   Skin: Negative.   Neurological: Negative.   Endo/Heme/Allergies: Negative.   Psychiatric/Behavioral: Negative.     Height _0  (1.702 m), weight 72.576 kg (160 lb). Physical Exam  Constitutional: She is oriented to person, place, and time. She appears well-developed and well-nourished.  HENT:  Head: Normocephalic and atraumatic.  Eyes: EOM are normal. Pupils are equal, round, and reactive to light.  Neck: Normal range of motion. Neck supple.  Cardiovascular: Normal rate and regular rhythm.   Respiratory: Effort normal and breath sounds normal.  GI: Soft. Bowel sounds are normal.  Genitourinary:  No CVAT  Musculoskeletal: Normal range of motion.  Neurological: She is alert and oriented to person, place, and time.  Skin: Skin is warm and dry.  Psychiatric: She has a normal mood and affect. Her behavior  is normal. Judgment and thought content normal.     Assessment/Plan  1 - Gross Hematuria / Bladder Cancer -  We rediscussed operative biopsy / transurethral resection as the best next step for diagnostic and therapeutic purposes with goals being to remove all visible cancer and obtain tissue for pathologic exam. We rediscussed that for some low-grade tumors, this may be all the treatment required, but that for many other tumors such as high-grade lesions, further therapy including  surgery and or chemotherapy may be warranted. We also outlined the fact that any bladder cancer diagnosis will require close follow-up with periodic upper and lower tract evaluation. We rediscussed risks including bleeding, infection, damage to kidney / ureter / bladder including bladder perforation which can typically managed with prolonged foley catheterization. We mentioned anesthetic and other rare risks including DVT, PE, MI, and mortality. I also mentioned that adjunctive procedures such as ureteral stenting, retrograde pyelography, and ureteroscopy may be necessary to fully evaluate the urinary tract depending on intra-operative findings. After answering all questions to the patient's satisfaction, they wish to proceed today as scheduled. Will also need left stent peri-op given proximity to left UO.  2 - Nephrolithiasis - tiny non-obstructing stone by most recent CT. Low risk. Observe.    [name] 03/26/2013, 8:59 AM

## 2013-03-26 NOTE — Brief Op Note (Signed)
03/26/2013  12:30 PM  PATIENT:  [name]  78 y.o. female  PRE-OPERATIVE DIAGNOSIS:  BLADDER CANCER  POST-OPERATIVE DIAGNOSIS:  * No post-op diagnosis entered *  PROCEDURE:  Procedure(s): TRANSURETHRAL RESECTION OF BLADDER TUMOR WITH GYRUS (TURBT-GYRUS) (N/A) CYSTOSCOPY WITH BILATERAL RETROGRADE PYELOGRAM/URETERAL STENT PLACEMENT (Bilateral)  SURGEON:  Surgeon(s) and Role:    * Alexis Frock, MD - Primary  PHYSICIAN ASSISTANT:   ASSISTANTS: none   ANESTHESIA:   general  EBL:  Total I/O In: 100 [I.V.:100] Out: -   BLOOD ADMINISTERED:none  DRAINS: none   LOCAL MEDICATIONS USED:  NONE  SPECIMEN:  Source of Specimen:  1 - Bladder Tumor, 2 - Base of Bladder Tumor  DISPOSITION OF SPECIMEN:  PATHOLOGY  COUNTS:  YES  TOURNIQUET:  * No tourniquets in log *  DICTATION: .Other Dictation: Dictation Number [id number]  PLAN OF CARE: Discharge to home after PACU  PATIENT DISPOSITION:  PACU - hemodynamically stable.   Delay start of Pharmacological VTE agent (>24hrs) due to surgical blood loss or risk of bleeding: yes

## 2013-03-26 NOTE — Anesthesia Preprocedure Evaluation (Addendum)
Anesthesia Evaluation  Patient identified by MRN, date of birth, ID band Patient awake  General Assessment Comment:.  History of CHF (congestive heart failure)         POST SURG 2010   .  Bladder cancer     .  Hypertension     .  COPD (chronic obstructive pulmonary disease)     .  History of breast cancer         DX DCIS IN 2004--  S/P RIGHT MASTECTOMY , NO CHEMORADIATION---  NO RECURRENCE   .  Glaucoma of both eyes     .  History of atrial fibrillation without current medication         POST SURG 2010--  PER PT NO ISSUES SINCE   .  Arthritis     .  Dyspnea on exertion     .  GERD (gastroesophageal reflux disease)          Reviewed: Allergy & Precautions, H&P , NPO status , Patient's Chart, lab work & pertinent test results  Airway Mallampati: II TM Distance: >3 FB Neck ROM: Full    Dental  (+) Caps   Pulmonary shortness of breath and with exertion, pneumonia -, resolved, COPD COPD inhaler, former smoker,  CXR: Lingular atelectasis. breath sounds clear to auscultation No wheezing but obvious prolonged expiratory phase.  + decreased breath sounds      Cardiovascular Exercise Tolerance: Good hypertension, +CHF + dysrhythmias Atrial Fibrillation Rhythm:Regular Rate:Normal  Had an episode of Afib and mild CHF after TKA in 2010. No heart problems since. Today rhythm is regular and rate is normal.   Neuro/Psych negative neurological ROS  negative psych ROS   GI/Hepatic Neg liver ROS, GERD-  Medicated,  Endo/Other  negative endocrine ROS  Renal/GU negative Renal ROS  negative genitourinary   Musculoskeletal negative musculoskeletal ROS (+)   Abdominal   Peds negative pediatric ROS (+)  Hematology negative hematology ROS (+)   Anesthesia Other Findings   Reproductive/Obstetrics negative OB ROS                         Anesthesia Physical Anesthesia Plan  ASA: III  Anesthesia Plan: General    Post-op Pain Management:    Induction: Intravenous  Airway Management Planned: Oral ETT  Additional Equipment:   Intra-op Plan:   Post-operative Plan: Extubation in OR  Informed Consent: I have reviewed the patients History and Physical, chart, labs and discussed the procedure including the risks, benefits and alternatives for the proposed anesthesia with the patient or authorized representative who has indicated his/her understanding and acceptance.   Dental advisory given  Plan Discussed with: CRNA  Anesthesia Plan Comments: (At increased risk for pulmonary complication. This was discussed. Spinal anesthesia was discussed and patient declined a spinal. She saw her primary care Dr. Minna Antis in the last few weeks.)      Anesthesia Quick Evaluation

## 2013-03-26 NOTE — Transfer of Care (Signed)
Immediate Anesthesia Transfer of Care Note  Patient: [name]  Procedure(s) Performed: Procedure(s): TRANSURETHRAL RESECTION OF BLADDER TUMOR WITH GYRUS (TURBT-GYRUS) (N/A) CYSTOSCOPY WITH BILATERAL RETROGRADE PYELOGRAM/URETERAL STENT PLACEMENT (Bilateral)  Patient Location: PACU  Anesthesia Type:General  Level of Consciousness: awake, alert  and oriented  Airway & Oxygen Therapy: Patient Spontanous Breathing and Patient connected to nasal cannula oxygen  Post-op Assessment: Report given to PACU RN  Post vital signs: Reviewed and stable  Complications: No apparent anesthesia complications

## 2013-03-27 ENCOUNTER — Encounter (HOSPITAL_BASED_OUTPATIENT_CLINIC_OR_DEPARTMENT_OTHER): Payer: Self-pay | Admitting: Urology

## 2013-03-27 NOTE — Op Note (Signed)
NAME:  [name], [name] NO.:  MEDICAL RECORD NO.:  [id number]  LOCATION:                                 FACILITY:  PHYSICIAN:  Alexis Frock, MD          DATE OF BIRTH:  DATE OF PROCEDURE: 03/26/2013 DATE OF DISCHARGE:                              OPERATIVE REPORT   DIAGNOSIS:  Bladder cancer.  PROCEDURE: 1. Transurethral resection of bladder tumor volume medium 2. Bilateral retrograde pyelograms with interpretation. 3. Insertion of left ureteral stent, 6 x 24, no tether.  FINDINGS: 1. Multifocal left lateral wall papillary bladder tumor, largest foci     just lateral to the left ureteral orifice total volume 3.5cm. 2. Unremarkable bilateral retrograde pyelogram.  ESTIMATED BLOOD LOSS:  Nil.  SPECIMEN: 1. Bladder tumor. 2. Base of bladder tumor.  COMPLICATIONS:  None.  INDICATIONS:  [name] is a very pleasant 78 year old lady with recent history of hematuria.  She was found on workup of this to have multifocal bladder cancer.  She underwent staging with CT urogram, which revealed no obvious distant disease.  She presents today for initial endoscopic resection.  Informed consent was obtained, and placed in the medical record.  The patient has tumor very near her left ureteral orifice.  She was counseled about the implications of this, as well as need for perioperative stenting and possible damage to her left drainage system in future from tumor.  PROCEDURE IN DETAIL:  The patient being [name], was verified. Procedure being transection bladder tumor was confirmed.  Procedure was carried out.  Time-out was performed.  Intravenous antibiotics were administered.  General LMA anesthesia was introduced.  The patient was placed into a low lithotomy position.  Sterile field was created by prepping and draping the patient's vagina, introitus, and proximal thighs using iodine x3.  Next, cystourethroscopy was performed using a 22-French rigid  cystoscope with 12-degree offset lens.  Inspection of the urinary bladder revealed multifocal papillary bladder cancer along the left lateral wall and the largest foci just lateral to the left ureteral orifice.  This was about 2.5 cm in diameter.  There was a separate foci approximately 1 cm in diameter further at the lateral wall.  There was also some mild papillary changes around this. Attention was then directed to bilateral retrograde pyelograms.  First the right ureteral orifice was gently cannulated using a 6-French end- hole catheter and right retrograde pyelogram was obtained.  Right retrograde pyelogram demonstrated a single right ureter, single system, right kidney.  No filling defects or narrowing noted.  Next, the left ureteral orifice was cannulated with a 6-French end-hole catheter and left retrograde pyelogram was obtained.  Left retrograde pyelogram demonstrated a single left ureter, single system, left kidney.  No filling defects or narrowing noted.  The cystoscope was then exchanged with a 26-French ACMI continuous flow resectoscope and using a medium-sized loop with normal saline irrigation, very careful systematic resection was performed of the bladder tumor from a lateral to medial orientation.  This was performed using intermittent paralysis via the anesthesia team to avoid severe obturator reflex.  This resulted in complete resection of all visible  tumor.  Additional hemostasis was performed using point coagulation current.  The edges resection site were coagulated further.  The system was then set aside labeled bladder tumor.  Next, cold cup biopsy forceps were used to obtain separate biopsies from the base of the resection site.  Two small fragments were taken that appeared to be muscular grossly.  These were set aside for permanent pathology labeled base of bladder tumor.  Additional hemostasis was achieved with again coagulation current via the loop.  Final  inspection at this point, revealed excellent hemostasis.  No evidence of perforation and no evidence directly to the left ureteral orifice, which was spared during the resection.  However, given the close proximity, it was felt that left ureteral stenting was warranted.  As such, the left ureteral orifice was then cannulated with a 6-French end-hole catheter and a zip wire was advanced to the level of the upper pole over which a new 6 x 24 double-J stent was placed using cystoscopic and fluoroscopic guidance.  Good proximal and distal curl were noted.  Bladder was emptied per cystoscope.  Procedure was then terminated.  The patient tolerated the procedure well.  There were no immediate periprocedural complications.  The patient was taken to postanesthesia care unit in stable condition.          ______________________________ Alexis Frock, MD     TM/MEDQ  D:  03/26/2013  T:  03/27/2013  Job:  [id number]

## 2013-04-09 ENCOUNTER — Other Ambulatory Visit: Payer: Self-pay | Admitting: Urology

## 2013-04-09 DIAGNOSIS — C679 Malignant neoplasm of bladder, unspecified: Secondary | ICD-10-CM | POA: Diagnosis not present

## 2013-04-09 DIAGNOSIS — R3989 Other symptoms and signs involving the genitourinary system: Secondary | ICD-10-CM | POA: Diagnosis not present

## 2013-04-09 DIAGNOSIS — R310 Gross hematuria: Secondary | ICD-10-CM | POA: Diagnosis not present

## 2013-05-05 ENCOUNTER — Encounter (HOSPITAL_BASED_OUTPATIENT_CLINIC_OR_DEPARTMENT_OTHER): Payer: Self-pay | Admitting: *Deleted

## 2013-05-05 NOTE — Progress Notes (Signed)
NPO AFTER MN. ARRIVES AT 0900. NEEDS ISTAT 8. CURRENT EKG AND CXR IN EPIC AND CHART.

## 2013-05-13 ENCOUNTER — Encounter (HOSPITAL_BASED_OUTPATIENT_CLINIC_OR_DEPARTMENT_OTHER)
Admission: RE | Disposition: A | Discharge status: Home / Self Care | Payer: Self-pay | Source: Ambulatory Visit | Attending: Urology

## 2013-05-13 ENCOUNTER — Ambulatory Visit (HOSPITAL_COMMUNITY): Payer: Medicare Other

## 2013-05-13 ENCOUNTER — Other Ambulatory Visit (HOSPITAL_BASED_OUTPATIENT_CLINIC_OR_DEPARTMENT_OTHER): Payer: Self-pay | Admitting: Urology

## 2013-05-13 ENCOUNTER — Ambulatory Visit (HOSPITAL_BASED_OUTPATIENT_CLINIC_OR_DEPARTMENT_OTHER): Payer: Medicare Other | Admitting: Anesthesiology

## 2013-05-13 ENCOUNTER — Encounter (HOSPITAL_BASED_OUTPATIENT_CLINIC_OR_DEPARTMENT_OTHER): Payer: Self-pay | Admitting: *Deleted

## 2013-05-13 ENCOUNTER — Encounter (HOSPITAL_BASED_OUTPATIENT_CLINIC_OR_DEPARTMENT_OTHER): Payer: Self-pay | Admitting: Anesthesiology

## 2013-05-13 ENCOUNTER — Ambulatory Visit (HOSPITAL_BASED_OUTPATIENT_CLINIC_OR_DEPARTMENT_OTHER)
Admission: RE | Admit: 2013-05-13 | Discharge: 2013-05-13 | Disposition: A | Discharge status: Home / Self Care | Payer: Medicare Other | Source: Ambulatory Visit | Attending: Urology | Admitting: Urology

## 2013-05-13 DIAGNOSIS — I509 Heart failure, unspecified: Secondary | ICD-10-CM | POA: Insufficient documentation

## 2013-05-13 DIAGNOSIS — I10 Essential (primary) hypertension: Secondary | ICD-10-CM | POA: Diagnosis not present

## 2013-05-13 DIAGNOSIS — D494 Neoplasm of unspecified behavior of bladder: Secondary | ICD-10-CM

## 2013-05-13 DIAGNOSIS — N3080 Other cystitis without hematuria: Secondary | ICD-10-CM | POA: Insufficient documentation

## 2013-05-13 DIAGNOSIS — N200 Calculus of kidney: Secondary | ICD-10-CM | POA: Insufficient documentation

## 2013-05-13 DIAGNOSIS — N1330 Unspecified hydronephrosis: Secondary | ICD-10-CM | POA: Insufficient documentation

## 2013-05-13 DIAGNOSIS — C679 Malignant neoplasm of bladder, unspecified: Secondary | ICD-10-CM | POA: Insufficient documentation

## 2013-05-13 DIAGNOSIS — J449 Chronic obstructive pulmonary disease, unspecified: Secondary | ICD-10-CM | POA: Diagnosis not present

## 2013-05-13 DIAGNOSIS — Z853 Personal history of malignant neoplasm of breast: Secondary | ICD-10-CM | POA: Diagnosis not present

## 2013-05-13 DIAGNOSIS — Z9010 Acquired absence of unspecified breast and nipple: Secondary | ICD-10-CM | POA: Insufficient documentation

## 2013-05-13 DIAGNOSIS — J4489 Other specified chronic obstructive pulmonary disease: Secondary | ICD-10-CM | POA: Insufficient documentation

## 2013-05-13 DIAGNOSIS — N3090 Cystitis, unspecified without hematuria: Secondary | ICD-10-CM | POA: Diagnosis not present

## 2013-05-13 DIAGNOSIS — K219 Gastro-esophageal reflux disease without esophagitis: Secondary | ICD-10-CM | POA: Insufficient documentation

## 2013-05-13 HISTORY — PX: TRANSURETHRAL RESECTION OF BLADDER TUMOR WITH GYRUS (TURBT-GYRUS): SHX6458

## 2013-05-13 HISTORY — PX: CYSTOSCOPY W/ URETERAL STENT PLACEMENT: SHX1429

## 2013-05-13 LAB — POCT I-STAT, CHEM 8
BUN: 21 mg/dL (ref 6–23)
Creatinine, Ser: 0.9 mg/dL (ref 0.50–1.10)
Hemoglobin: 15 g/dL (ref 12.0–15.0)
Potassium: 4.1 mEq/L (ref 3.5–5.1)
Sodium: 143 mEq/L (ref 135–145)

## 2013-05-13 SURGERY — TRANSURETHRAL RESECTION OF BLADDER TUMOR WITH GYRUS (TURBT-GYRUS)
Anesthesia: General | Site: Ureter | Wound class: Clean Contaminated

## 2013-05-13 MED ORDER — LACTATED RINGERS IV SOLN
INTRAVENOUS | Status: DC
  Administered 2013-05-13: 10:00:00 via INTRAVENOUS
  Filled 2013-05-13: qty 1000

## 2013-05-13 MED ORDER — FENTANYL CITRATE 0.05 MG/ML IJ SOLN
INTRAMUSCULAR | Status: DC | PRN
  Administered 2013-05-13 (×2): 25 ug via INTRAVENOUS

## 2013-05-13 MED ORDER — IOHEXOL 350 MG/ML SOLN
INTRAVENOUS | Status: DC | PRN
  Administered 2013-05-13: 18 mL

## 2013-05-13 MED ORDER — ONDANSETRON HCL 4 MG/2ML IJ SOLN
INTRAMUSCULAR | Status: DC | PRN
  Administered 2013-05-13: 4 mg via INTRAVENOUS

## 2013-05-13 MED ORDER — SULFAMETHOXAZOLE-TMP DS 800-160 MG PO TABS: 1.0000 | ORAL_TABLET | Freq: Every day | ORAL | Status: DC

## 2013-05-13 MED ORDER — PROMETHAZINE HCL 25 MG/ML IJ SOLN
6.2500 mg | INTRAMUSCULAR | Status: DC | PRN
  Filled 2013-05-13: qty 1

## 2013-05-13 MED ORDER — SODIUM CHLORIDE 0.9 % IR SOLN
Status: DC | PRN
  Administered 2013-05-13: 6000 mL

## 2013-05-13 MED ORDER — KETOROLAC TROMETHAMINE 30 MG/ML IJ SOLN
15.0000 mg | Freq: Once | INTRAMUSCULAR | Status: DC | PRN
  Filled 2013-05-13: qty 1

## 2013-05-13 MED ORDER — DEXAMETHASONE SODIUM PHOSPHATE 4 MG/ML IJ SOLN
INTRAMUSCULAR | Status: DC | PRN
  Administered 2013-05-13: 10 mg via INTRAVENOUS

## 2013-05-13 MED ORDER — TRAMADOL HCL 50 MG PO TABS: 50.0000 mg | ORAL_TABLET | Freq: Four times a day (QID) | ORAL | Status: DC | PRN

## 2013-05-13 MED ORDER — SUCCINYLCHOLINE CHLORIDE 20 MG/ML IJ SOLN
INTRAMUSCULAR | Status: DC | PRN
  Administered 2013-05-13: 60 mg via INTRAVENOUS

## 2013-05-13 MED ORDER — FENTANYL CITRATE 0.05 MG/ML IJ SOLN
25.0000 ug | INTRAMUSCULAR | Status: DC | PRN
  Filled 2013-05-13: qty 1

## 2013-05-13 MED ORDER — GENTAMICIN IN SALINE 1.6-0.9 MG/ML-% IV SOLN
80.0000 mg | INTRAVENOUS | Status: DC
  Filled 2013-05-13: qty 50

## 2013-05-13 MED ORDER — STERILE WATER FOR IRRIGATION IR SOLN
Status: DC | PRN
  Administered 2013-05-13: 1000 mL

## 2013-05-13 MED ORDER — GENTAMICIN SULFATE 40 MG/ML IJ SOLN
5.0000 mg/kg | Freq: Once | INTRAVENOUS | Status: AC
  Administered 2013-05-13: 370 mg via INTRAVENOUS
  Filled 2013-05-13: qty 9.13

## 2013-05-13 MED ORDER — PROPOFOL 10 MG/ML IV BOLUS
INTRAVENOUS | Status: DC | PRN
  Administered 2013-05-13: 20 mg via INTRAVENOUS
  Administered 2013-05-13: 150 mg via INTRAVENOUS

## 2013-05-13 MED ORDER — LIDOCAINE HCL (CARDIAC) 20 MG/ML IV SOLN
INTRAVENOUS | Status: DC | PRN
  Administered 2013-05-13: 10 mg via INTRAVENOUS
  Administered 2013-05-13: 50 mg via INTRAVENOUS

## 2013-05-13 SURGICAL SUPPLY — 33 items
BAG DRN ANRFLXCHMBR STRAP LEK (BAG)
BAG URINE DRAINAGE (UROLOGICAL SUPPLIES) IMPLANT
BAG URINE LEG 19OZ MD ST LTX (BAG) IMPLANT
BAG URINE LEG 500ML (DRAIN) IMPLANT
BAG URO CATCHER STRL LF (DRAPE) ×3 IMPLANT
BASKET LASER NITINOL 1.9FR (BASKET) IMPLANT
BASKET ZERO TIP NITINOL 2.4FR (BASKET) IMPLANT
BSKT STON RTRVL 120 1.9FR (BASKET)
BSKT STON RTRVL ZERO TP 2.4FR (BASKET)
CATH FOLEY 2WAY SLVR  5CC 22FR (CATHETERS)
CATH FOLEY 2WAY SLVR 30CC 20FR (CATHETERS) IMPLANT
CATH FOLEY 2WAY SLVR 5CC 22FR (CATHETERS) IMPLANT
CATH INTERMIT  6FR 70CM (CATHETERS) ×3 IMPLANT
CLOTH BEACON ORANGE TIMEOUT ST (SAFETY) ×3 IMPLANT
DRAPE CAMERA CLOSED 9X96 (DRAPES) ×3 IMPLANT
ELECT LOOP MED HF 24F 12D CBL (CLIP) ×1 IMPLANT
ELECT REM PT RETURN 9FT ADLT (ELECTROSURGICAL) ×3
ELECT RESECT VAPORIZE 12D CBL (ELECTRODE) IMPLANT
ELECTRODE REM PT RTRN 9FT ADLT (ELECTROSURGICAL) ×2 IMPLANT
EVACUATOR MICROVAS BLADDER (UROLOGICAL SUPPLIES) IMPLANT
GLOVE BIO SURGEON STRL SZ7.5 (GLOVE) ×3 IMPLANT
GOWN PREVENTION PLUS XLARGE (GOWN DISPOSABLE) ×3 IMPLANT
GOWN STRL NON-REIN LRG LVL3 (GOWN DISPOSABLE) ×3 IMPLANT
GUIDEWIRE ANG ZIPWIRE 038X150 (WIRE) ×3 IMPLANT
GUIDEWIRE STR DUAL SENSOR (WIRE) ×3 IMPLANT
IV NS IRRIG 3000ML ARTHROMATIC (IV SOLUTION) ×3 IMPLANT
KIT ASPIRATION TUBING (SET/KITS/TRAYS/PACK) IMPLANT
PACK CYSTOSCOPY (CUSTOM PROCEDURE TRAY) ×3 IMPLANT
SET ASPIRATION TUBING (TUBING) IMPLANT
STENT POLARIS 6FRX24X.038 (STENTS) ×1 IMPLANT
SYRINGE 10CC LL (SYRINGE) ×3 IMPLANT
SYRINGE IRR TOOMEY STRL 70CC (SYRINGE) IMPLANT
TUBE FEEDING 8FR 16IN STR KANG (MISCELLANEOUS) ×1 IMPLANT

## 2013-05-13 NOTE — Anesthesia Procedure Notes (Signed)
Procedure Name: LMA Insertion Date/Time: 05/13/2013 11:42 AM Performed by: Mechele Claude Pre-anesthesia Checklist: Patient identified, Emergency Drugs available, Suction available and Patient being monitored Patient Re-evaluated:Patient Re-evaluated prior to inductionOxygen Delivery Method: Circle System Utilized Preoxygenation: Pre-oxygenation with 100% oxygen Intubation Type: IV induction Ventilation: Mask ventilation without difficulty LMA: LMA inserted LMA Size: 4.0 Number of attempts: 1 Airway Equipment and Method: bite block Placement Confirmation: positive ETCO2 Tube secured with: Tape Dental Injury: Teeth and Oropharynx as per pre-operative assessment

## 2013-05-13 NOTE — Anesthesia Postprocedure Evaluation (Signed)
  Anesthesia Post-op Note  Patient: [name]  Procedure(s) Performed: Procedure(s) (LRB): TRANSURETHRAL RESECTION OF BLADDER TUMOR WITH GYRUS (TURBT-GYRUS)  RE-STAGING TRANSURETHRAL RESECTION OF BLADDER TUMOR, LEFT RETROGRADE PYELOGRAM AND STENT EXCHANGE (N/A) CYSTOSCOPY WITH RETROGRADE PYELOGRAM/URETERAL STENT PLACEMENT STENT EXCHANGE (Left)  Patient Location: PACU  Anesthesia Type: General  Level of Consciousness: awake and alert   Airway and Oxygen Therapy: Patient Spontanous Breathing  Post-op Pain: mild  Post-op Assessment: Post-op Vital signs reviewed, Patient's Cardiovascular Status Stable, Respiratory Function Stable, Patent Airway and No signs of Nausea or vomiting  Last Vitals:  Filed Vitals:   05/13/13 1217  BP: 140/76  Pulse: 69  Temp: 36 C  Resp: 13    Post-op Vital Signs: stable   Complications: No apparent anesthesia complications

## 2013-05-13 NOTE — Anesthesia Preprocedure Evaluation (Signed)
Anesthesia Evaluation  Patient identified by MRN, date of birth, ID band Patient awake    Reviewed: Allergy & Precautions, H&P , NPO status , Patient's Chart, lab work & pertinent test results  Airway Mallampati: III TM Distance: <3 FB Neck ROM: Full    Dental no notable dental hx.    Pulmonary shortness of breath and with exertion, COPD breath sounds clear to auscultation  Pulmonary exam normal       Cardiovascular hypertension, Pt. on medications Rhythm:Regular Rate:Normal     Neuro/Psych negative neurological ROS  negative psych ROS   GI/Hepatic Neg liver ROS, GERD-  Medicated,  Endo/Other  negative endocrine ROS  Renal/GU negative Renal ROS  negative genitourinary   Musculoskeletal negative musculoskeletal ROS (+)   Abdominal   Peds negative pediatric ROS (+)  Hematology negative hematology ROS (+)   Anesthesia Other Findings   Reproductive/Obstetrics negative OB ROS                           Anesthesia Physical Anesthesia Plan  ASA: III  Anesthesia Plan: General   Post-op Pain Management:    Induction: Intravenous  Airway Management Planned: LMA  Additional Equipment:   Intra-op Plan:   Post-operative Plan:   Informed Consent: I have reviewed the patients History and Physical, chart, labs and discussed the procedure including the risks, benefits and alternatives for the proposed anesthesia with the patient or authorized representative who has indicated his/her understanding and acceptance.   Dental advisory given  Plan Discussed with: CRNA and Surgeon  Anesthesia Plan Comments:         Anesthesia Quick Evaluation

## 2013-05-13 NOTE — Brief Op Note (Signed)
05/13/2013  12:09 PM  PATIENT:  [name]  78 y.o. female  PRE-OPERATIVE DIAGNOSIS:  BLADDER CANCER  POST-OPERATIVE DIAGNOSIS:  BLADDER CANCER  PROCEDURE:  Procedure(s): TRANSURETHRAL RESECTION OF BLADDER TUMOR WITH GYRUS (TURBT-GYRUS)  RE-STAGING TRANSURETHRAL RESECTION OF BLADDER TUMOR, LEFT RETROGRADE PYELOGRAM AND STENT EXCHANGE (N/A) CYSTOSCOPY WITH RETROGRADE PYELOGRAM/URETERAL STENT PLACEMENT STENT EXCHANGE (Left)  SURGEON:  Surgeon(s) and Role:    * Alexis Frock, MD - Primary  PHYSICIAN ASSISTANT:   ASSISTANTS: none   ANESTHESIA:   general  EBL:  Total I/O In: 200 [I.V.:200] Out: -   BLOOD ADMINISTERED:none  DRAINS: none   LOCAL MEDICATIONS USED:  NONE  SPECIMEN:  Source of Specimen:  Left Wall Old Resection Site  DISPOSITION OF SPECIMEN:  PATHOLOGY  COUNTS:  YES  TOURNIQUET:  * No tourniquets in log *  DICTATION: .Other Dictation: Dictation Number [id number]  PLAN OF CARE: Discharge to home after PACU  PATIENT DISPOSITION:  PACU - hemodynamically stable.   Delay start of Pharmacological VTE agent (>24hrs) due to surgical blood loss or risk of bleeding: not applicable

## 2013-05-13 NOTE — Transfer of Care (Signed)
Immediate Anesthesia Transfer of Care Note  Patient: [name]  Procedure(s) Performed: Procedure(s) (LRB): TRANSURETHRAL RESECTION OF BLADDER TUMOR WITH GYRUS (TURBT-GYRUS)  RE-STAGING TRANSURETHRAL RESECTION OF BLADDER TUMOR, LEFT RETROGRADE PYELOGRAM AND STENT EXCHANGE (N/A) CYSTOSCOPY WITH RETROGRADE PYELOGRAM/URETERAL STENT PLACEMENT STENT EXCHANGE (Left)  Patient Location: PACU  Anesthesia Type: General  Level of Consciousness: awake, alert  and oriented  Airway & Oxygen Therapy: Patient Spontanous Breathing and Patient connected to face mask oxygen  Post-op Assessment: Report given to PACU RN and Post -op Vital signs reviewed and stable  Post vital signs: Reviewed and stable  Complications: No apparent anesthesia complications

## 2013-05-13 NOTE — H&P (Signed)
[name] is an 78 y.o. female.    Chief Complaint: Pre-Op Restaging TURBT  HPI:    1 - High Grade Bladder Cancer - Prior 40PY smoker. Status post  03/2013 - TURBT + Lt JJ Stent - TaG3 multifocal, muscle present. CT Urogram w/o distant lesions.  2 - Nephrolithiasis - Had left ureteral stone 2012 managed wtih medical therapy. CT 2014 with punctate left lower pole stone only.   PMH sig for breast CA (mastectomy), Knee surgery. COPD (No O2). No CV disease. No strong blood thinners.   Today [name] is seen to proceed with restaging transurethral resection of bladder tumor. No interval fevers. Most recent UCX negative.  Past Medical History  Diagnosis Date  . History of CHF (congestive heart failure)     POST SURG 2010  . Bladder cancer   . Hypertension   . COPD (chronic obstructive pulmonary disease)   . History of breast cancer     DX DCIS IN 2004--  S/P RIGHT MASTECTOMY , NO CHEMORADIATION---  NO RECURRENCE  . Glaucoma of both eyes   . History of atrial fibrillation without current medication 2010    POST SURG 2010--  PER PT NO ISSUES SINCE  . Arthritis   . Dyspnea on exertion   . GERD (gastroesophageal reflux disease)     Past Surgical History  Procedure Laterality Date  . Total knee arthroplasty Right 03-22-2009  . Partial mastectomy with needle localization Right 01-14-2003    DCIS  . Total mastectomy Right 02-03-2003    W/ SLN BX  AND  POST 03-01-2003 EVACUATION HEMATOMA  . Removal cyst left hand  2013  . Transthoracic echocardiogram  03-25-2009    MILD LVF/ EF 70-80%/ MILD INCREASE SYSTOLIC PULMONARY  PRESSURE  . Transurethral resection of bladder tumor with gyrus (turbt-gyrus) N/A 03/26/2013    Procedure: TRANSURETHRAL RESECTION OF BLADDER TUMOR WITH GYRUS (TURBT-GYRUS);  Surgeon: Alexis Frock, MD;  Location: [facility];  Service: Urology;  Laterality: N/A;  . Cystoscopy w/ ureteral stent placement Bilateral 03/26/2013    Procedure: CYSTOSCOPY  WITH BILATERAL RETROGRADE PYELOGRAM/URETERAL STENT PLACEMENT;  Surgeon: Alexis Frock, MD;  Location: [facility];  Service: Urology;  Laterality: Bilateral;    History reviewed. No pertinent family history. Social History:  reports that she quit smoking about 10 years ago. Her smoking use included Cigarettes. She has a 40 pack-year smoking history. She has never used smokeless tobacco. She reports that she does not drink alcohol or use illicit drugs.  Allergies:  Allergies  Allergen Reactions  . Morphine And Related Nausea And Vomiting    No prescriptions prior to admission    No results found for this or any previous visit (from the past 48 hour(s)). No results found.  Review of Systems  Constitutional: Negative.  Negative for fever and chills.  HENT: Negative.   Eyes: Negative.   Respiratory: Negative.   Cardiovascular: Negative.   Gastrointestinal: Negative.   Genitourinary: Negative.   Musculoskeletal: Negative.   Skin: Negative.   Neurological: Negative.   Endo/Heme/Allergies: Negative.   Psychiatric/Behavioral: Negative.     Height _0  (1.702 m), weight 71.668 kg (158 lb). Physical Exam  Constitutional: She appears well-developed and well-nourished.  HENT:  Head: Normocephalic and atraumatic.  Eyes: EOM are normal. Pupils are equal, round, and reactive to light.  Neck: Normal range of motion. Neck supple.  Cardiovascular: Normal rate and regular rhythm.   Respiratory: Effort normal.  GI: Soft. Bowel sounds are normal.  Genitourinary:  No CVAT  Musculoskeletal: Normal range of motion.  Neurological: She is alert.  Skin: Skin is warm and dry.  Psychiatric: She has a normal mood and affect. Her behavior is normal. Judgment and thought content normal.     Assessment/Plan  1 - High Grade Bladder Cancer - Discussed path in detail including fact that high grade (potentially lethal), but only superficial (bladder spaarring preferred).    Discussed addtional treatment with preferred path of Re-staging TURBT . She wants to proceed.  We rediscussed operative biopsy / transurethral resection as the best next step for diagnostic and therapeutic purposes with goals being to remove all visible cancer and obtain tissue for pathologic exam. We rediscussed that for some low-grade tumors, this may be all the treatment required, but that for many other tumors such as high-grade lesions, further therapy including surgery and or chemotherapy may be warranted. We also outlined the fact that any bladder cancer diagnosis will require close follow-up with periodic upper and lower tract evaluation. We rediscussed risks including bleeding, infection, damage to kidney / ureter / bladder including bladder perforation which can typically managed with prolonged foley catheterization. We rementioned anesthetic and other rare risks including DVT, PE, MI, and mortality. I also rementioned that adjunctive procedures such as ureteral stenting, retrograde pyelography, and ureteroscopy may be necessary to fully evaluate the urinary tract depending on intra-operative findings. After answering all questions to the patient's satisfaction, they wish to proceed.    2 - Nephrolithiasis - tiny non-obstructing stone by most recent CT. Low risk. Observe.   [name] 05/13/2013, 5:31 AM

## 2013-05-14 ENCOUNTER — Encounter (HOSPITAL_BASED_OUTPATIENT_CLINIC_OR_DEPARTMENT_OTHER): Payer: Self-pay | Admitting: Urology

## 2013-05-14 NOTE — Op Note (Signed)
NAME:  [name], [name] NO.:  [id number]  MEDICAL RECORD NO.:  [id number]  LOCATION:                                 FACILITY:  PHYSICIAN:  Alexis Frock, MD     DATE OF BIRTH:  1935-03-06  DATE OF PROCEDURE:  05/13/2013 DATE OF DISCHARGE:  05/13/2013                              OPERATIVE REPORT   DIAGNOSIS:  Bladder cancer.  PROCEDURE: 1. Cystoscopy with left retrograde pyelogram interpretation. 2. Exchange of left ureteral stent, 6 x 24 Polaris, no tether. 3. Restaging transurethral resection of bladder tumor, volume medium.  FINDINGS: 1. 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  No large filling defects. 2. No obvious residual papillary tumor on the left old resection site.  ESTIMATED BLOOD LOSS:  Less than 10 mL.  COMPLICATIONS:  None.  SPECIMEN:  Left bladder wall old resection site.  INDICATIONS:  [name] is a pleasant 78 year old lady who was found on workup of gross hematuria to have a TaG3 bladder cancer very close to her left ureteral orifice.  She underwent initial resection in July 2014 along with placement of left ureteral stent.  Given the presence of high- grade disease, we discussed options including proceeding with induction BCG versus restaging and she wished to proceed with restaging.  Informed consent was obtained and placed in medical record.  PROCEDURE IN DETAIL:  The patient being [name] was verified and the procedure restaging transurethral resection of bladder was performed.  Procedure was carried out.  Time-out was performed. Intravenous antibiotics were administered.  General LMA anesthesia was introduced.  The patient placed into a low lithotomy position.  Sterile field was created by prepping and draping the patient's vagina, introitus, and proximal thighs using iodine x3.  Next, cystourethroscopy was performed using a 22-French rigid scope  12-degree offset lens. Inspection of the urinary bladder, the distal end of left ureteral stent in situ has minimal encrustation, there is some expected periureteral edema.  The old resection site appeared to be healing nicely with some fibrinous tissue.  There was no obvious recurrence of papillary masses. There are no additional lesions in the urinary bladder.  Next, the cystoscope was exchanged with a 26-French ACMI continuous flow resectoscope using a medium-sized loop, careful resection was performed of the entire old resection site as well as an area wider of this for total resection size approximately 3 cm2.  Great care was taken to avoid perforation of the bladder or resection of ureteral orifice, neither of which occurred.  During the lateral resection, succinylcholine was given to avoid obturator reflex.  Bladder fragments were irrigated, set aside labeled left old resection site.  Next, the distal end of the left ureteral stent was brought to the level of urethral meatus through which a 0.038 Glidewire was advanced at the level of the upper pole.  The stent was exchanged for a 6-French end-hole catheter and left retrograde pyelogram was seen.  Left retrograde pyelogram demonstrated a single left ureter, single system left kidney.  There were some mild-to-moderate hydronephrosis. There were several angulations of the ureter consistent with cystitis cystica as was present  at the level of ureteral orifices, but no obvious intraluminal filling defects per se.  As such, the 0.038 Glidewire was readvanced and the open-ended catheter was exchanged for a new 6 x 24 Polaris-type stent, good proximal and distal deployment were noted. Bladder was emptied per cystoscope.  Procedure then terminated.  The patient tolerated the procedure well.  There were no immediate periprocedural complications.  The patient was taken to postanesthesia care in stable condition.           ______________________________ Alexis Frock, MD     TM/MEDQ  D:  05/13/2013  T:  05/13/2013  Job:  [id number]

## 2013-05-28 DIAGNOSIS — N201 Calculus of ureter: Secondary | ICD-10-CM | POA: Diagnosis not present

## 2013-05-28 DIAGNOSIS — C679 Malignant neoplasm of bladder, unspecified: Secondary | ICD-10-CM | POA: Diagnosis not present

## 2013-05-28 DIAGNOSIS — R310 Gross hematuria: Secondary | ICD-10-CM | POA: Diagnosis not present

## 2013-06-09 DIAGNOSIS — L570 Actinic keratosis: Secondary | ICD-10-CM | POA: Diagnosis not present

## 2013-06-09 DIAGNOSIS — L909 Atrophic disorder of skin, unspecified: Secondary | ICD-10-CM | POA: Diagnosis not present

## 2013-06-09 DIAGNOSIS — L719 Rosacea, unspecified: Secondary | ICD-10-CM | POA: Diagnosis not present

## 2013-06-09 DIAGNOSIS — Z85828 Personal history of other malignant neoplasm of skin: Secondary | ICD-10-CM | POA: Diagnosis not present

## 2013-06-09 DIAGNOSIS — L821 Other seborrheic keratosis: Secondary | ICD-10-CM | POA: Diagnosis not present

## 2013-06-09 DIAGNOSIS — L259 Unspecified contact dermatitis, unspecified cause: Secondary | ICD-10-CM | POA: Diagnosis not present

## 2013-06-12 DIAGNOSIS — C679 Malignant neoplasm of bladder, unspecified: Secondary | ICD-10-CM | POA: Diagnosis not present

## 2013-06-19 DIAGNOSIS — C679 Malignant neoplasm of bladder, unspecified: Secondary | ICD-10-CM | POA: Diagnosis not present

## 2013-06-23 DIAGNOSIS — Z23 Encounter for immunization: Secondary | ICD-10-CM | POA: Diagnosis not present

## 2013-06-26 DIAGNOSIS — C679 Malignant neoplasm of bladder, unspecified: Secondary | ICD-10-CM | POA: Diagnosis not present

## 2013-07-03 DIAGNOSIS — C672 Malignant neoplasm of lateral wall of bladder: Secondary | ICD-10-CM | POA: Diagnosis not present

## 2013-07-10 DIAGNOSIS — C679 Malignant neoplasm of bladder, unspecified: Secondary | ICD-10-CM | POA: Diagnosis not present

## 2013-07-17 DIAGNOSIS — C679 Malignant neoplasm of bladder, unspecified: Secondary | ICD-10-CM | POA: Diagnosis not present

## 2013-08-05 DIAGNOSIS — Z1231 Encounter for screening mammogram for malignant neoplasm of breast: Secondary | ICD-10-CM | POA: Diagnosis not present

## 2013-09-07 DIAGNOSIS — C679 Malignant neoplasm of bladder, unspecified: Secondary | ICD-10-CM | POA: Diagnosis not present

## 2013-09-24 DIAGNOSIS — H409 Unspecified glaucoma: Secondary | ICD-10-CM | POA: Diagnosis not present

## 2013-09-24 DIAGNOSIS — H04129 Dry eye syndrome of unspecified lacrimal gland: Secondary | ICD-10-CM | POA: Diagnosis not present

## 2013-09-24 DIAGNOSIS — H01009 Unspecified blepharitis unspecified eye, unspecified eyelid: Secondary | ICD-10-CM | POA: Diagnosis not present

## 2013-09-24 DIAGNOSIS — H4011X0 Primary open-angle glaucoma, stage unspecified: Secondary | ICD-10-CM | POA: Diagnosis not present

## 2013-10-05 ENCOUNTER — Other Ambulatory Visit: Payer: Self-pay | Admitting: Dermatology

## 2013-10-05 DIAGNOSIS — L259 Unspecified contact dermatitis, unspecified cause: Secondary | ICD-10-CM | POA: Diagnosis not present

## 2013-10-05 DIAGNOSIS — B029 Zoster without complications: Secondary | ICD-10-CM | POA: Diagnosis not present

## 2013-10-05 DIAGNOSIS — L821 Other seborrheic keratosis: Secondary | ICD-10-CM | POA: Diagnosis not present

## 2013-10-05 DIAGNOSIS — Z85828 Personal history of other malignant neoplasm of skin: Secondary | ICD-10-CM | POA: Diagnosis not present

## 2013-10-05 DIAGNOSIS — D485 Neoplasm of uncertain behavior of skin: Secondary | ICD-10-CM | POA: Diagnosis not present

## 2013-10-13 DIAGNOSIS — J069 Acute upper respiratory infection, unspecified: Secondary | ICD-10-CM | POA: Diagnosis not present

## 2013-10-16 DIAGNOSIS — E7800 Pure hypercholesterolemia, unspecified: Secondary | ICD-10-CM | POA: Diagnosis not present

## 2013-10-16 DIAGNOSIS — K219 Gastro-esophageal reflux disease without esophagitis: Secondary | ICD-10-CM | POA: Diagnosis not present

## 2013-10-16 DIAGNOSIS — I10 Essential (primary) hypertension: Secondary | ICD-10-CM | POA: Diagnosis not present

## 2013-10-16 DIAGNOSIS — Z0000 Encounter for general adult medical examination without abnormal findings: Secondary | ICD-10-CM | POA: Diagnosis not present

## 2013-10-16 DIAGNOSIS — E559 Vitamin D deficiency, unspecified: Secondary | ICD-10-CM | POA: Diagnosis not present

## 2013-11-02 DIAGNOSIS — Z0000 Encounter for general adult medical examination without abnormal findings: Secondary | ICD-10-CM | POA: Diagnosis not present

## 2013-11-02 DIAGNOSIS — Z1331 Encounter for screening for depression: Secondary | ICD-10-CM | POA: Diagnosis not present

## 2013-11-02 DIAGNOSIS — J449 Chronic obstructive pulmonary disease, unspecified: Secondary | ICD-10-CM | POA: Diagnosis not present

## 2013-11-02 DIAGNOSIS — K219 Gastro-esophageal reflux disease without esophagitis: Secondary | ICD-10-CM | POA: Diagnosis not present

## 2013-12-14 DIAGNOSIS — R3989 Other symptoms and signs involving the genitourinary system: Secondary | ICD-10-CM | POA: Diagnosis not present

## 2013-12-14 DIAGNOSIS — Z8551 Personal history of malignant neoplasm of bladder: Secondary | ICD-10-CM | POA: Diagnosis not present

## 2013-12-30 ENCOUNTER — Ambulatory Visit: Payer: Self-pay | Admitting: Obstetrics & Gynecology

## 2013-12-31 ENCOUNTER — Encounter: Payer: Self-pay | Admitting: Obstetrics & Gynecology

## 2014-01-01 ENCOUNTER — Encounter: Payer: Self-pay | Admitting: Obstetrics & Gynecology

## 2014-01-01 ENCOUNTER — Ambulatory Visit (INDEPENDENT_AMBULATORY_CARE_PROVIDER_SITE_OTHER): Payer: Medicare Other | Admitting: Obstetrics & Gynecology

## 2014-01-01 VITALS — BP 124/64 | HR 60 | Resp 20 | Ht 65.75 in | Wt 166.2 lb

## 2014-01-01 DIAGNOSIS — I509 Heart failure, unspecified: Secondary | ICD-10-CM | POA: Diagnosis not present

## 2014-01-01 DIAGNOSIS — C679 Malignant neoplasm of bladder, unspecified: Secondary | ICD-10-CM

## 2014-01-01 DIAGNOSIS — H409 Unspecified glaucoma: Secondary | ICD-10-CM | POA: Diagnosis not present

## 2014-01-01 DIAGNOSIS — D0510 Intraductal carcinoma in situ of unspecified breast: Secondary | ICD-10-CM

## 2014-01-01 DIAGNOSIS — Z01419 Encounter for gynecological examination (general) (routine) without abnormal findings: Secondary | ICD-10-CM | POA: Diagnosis not present

## 2014-01-01 DIAGNOSIS — D0590 Unspecified type of carcinoma in situ of unspecified breast: Secondary | ICD-10-CM

## 2014-01-01 DIAGNOSIS — Z124 Encounter for screening for malignant neoplasm of cervix: Secondary | ICD-10-CM

## 2014-01-01 LAB — HM PAP SMEAR

## 2014-01-01 NOTE — Progress Notes (Signed)
79 y.o. [name] MarriedCaucasianF here for annual exam.  No vaginal bleeding.  Pt states she IS going to do her colonoscopy this year.  Doesn't want me to do a referral.  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    Saw Dr. Minna Antis in March.  Did BMD while in his office.   Patient's last menstrual period was 12/03/2002.          Sexually active: yes  The current method of family planning is post menopausal status.    Exercising: yes  sometimes Smoker:  Former smoker  Health Maintenance: Pap:  10/13/12 WNL/negative HR HPV History of abnormal Pap:  no MMG:  12/14-[facility] Colonoscopy:  2005-repeat in 5 years-states will call to get appointment/certain foods causing diarrhea BMD:   11/02/13 TDaP:  2007 (has had zostavax and pneumonia vaccine) Screening Labs: PCP, Hb today: PCP, Urine today: PCP   reports that she quit smoking about 10 years ago. Her smoking use included Cigarettes. She has a 40 pack-year smoking history. She has never used smokeless tobacco. She reports that she does not drink alcohol or use illicit drugs.  Past Medical History  Diagnosis Date  . History of CHF (congestive heart failure)     POST SURG 2010  . Bladder cancer 7/14    cystoscope every three months  . Hypertension   . COPD (chronic obstructive pulmonary disease)   . History of breast cancer     DX DCIS IN 2004--  S/P RIGHT MASTECTOMY , NO CHEMORADIATION---  NO RECURRENCE  . Glaucoma of both eyes   . History of atrial fibrillation without current medication 2010    POST SURG 2010--  PER PT NO ISSUES SINCE  . Arthritis   . Dyspnea on exertion   . GERD (gastroesophageal reflux disease)   . A-fib   . Nephrolithiasis   . Skin cancer   . Shingles 2/15    Past Surgical History  Procedure Laterality Date  . Total knee arthroplasty Right 03-22-2009  . Partial mastectomy with needle localization Right 01-14-2003    DCIS  . Total mastectomy Right 02-03-2003    W/ SLN BX  AND  POST 03-01-2003 EVACUATION HEMATOMA  .  Removal cyst left hand  2013  . Transthoracic echocardiogram  03-25-2009    MILD LVF/ EF 70-80%/ MILD INCREASE SYSTOLIC PULMONARY  PRESSURE  . Transurethral resection of bladder tumor with gyrus (turbt-gyrus) N/A 03/26/2013    Procedure: TRANSURETHRAL RESECTION OF BLADDER TUMOR WITH GYRUS (TURBT-GYRUS);  Surgeon: Alexis Frock, MD;  Location: [facility];  Service: Urology;  Laterality: N/A;  . Cystoscopy w/ ureteral stent placement Bilateral 03/26/2013    Procedure: CYSTOSCOPY WITH BILATERAL RETROGRADE PYELOGRAM/URETERAL STENT PLACEMENT;  Surgeon: Alexis Frock, MD;  Location: [facility];  Service: Urology;  Laterality: Bilateral;  . Transurethral resection of bladder tumor with gyrus (turbt-gyrus) N/A 05/13/2013    Procedure: TRANSURETHRAL RESECTION OF BLADDER TUMOR WITH GYRUS (TURBT-GYRUS)  RE-STAGING TRANSURETHRAL RESECTION OF BLADDER TUMOR, LEFT RETROGRADE PYELOGRAM AND STENT EXCHANGE;  Surgeon: Alexis Frock, MD;  Location: [facility];  Service: Urology;  Laterality: N/A;  . Cystoscopy w/ ureteral stent placement Left 05/13/2013    Procedure: CYSTOSCOPY WITH RETROGRADE PYELOGRAM/URETERAL STENT PLACEMENT STENT EXCHANGE;  Surgeon: Alexis Frock, MD;  Location: [facility];  Service: Urology;  Laterality: Left;  . Tubal ligation    . Cataract extraction      Current Outpatient Prescriptions  Medication Sig Dispense Refill  . amoxicillin (AMOXIL) 500 MG capsule  Prior to dentist appointment      . aspirin 81 MG tablet Take 81 mg by mouth daily.      Marland Kitchen latanoprost (XALATAN) 0.005 % ophthalmic solution Place 1 drop into both eyes at bedtime.      Marland Kitchen omeprazole (PRILOSEC) 40 MG capsule Take 40 mg by mouth every evening.      . timolol (TIMOPTIC-XR) 0.5 % ophthalmic gel-forming       . levalbuterol (XOPENEX HFA) 45 MCG/ACT inhaler Inhale 1-2 puffs into the lungs every 4 (four) hours as needed for wheezing.      .  sulfamethoxazole-trimethoprim (BACTRIM DS) 800-160 MG per tablet Take 1 tablet by mouth daily. X 3 days. Begin day prior to next Urology appointment  3 tablet  0   No current facility-administered medications for this visit.    Family History  Problem Relation Age of Onset  . Colon cancer Mother 74  . Colon cancer Maternal Grandmother   . Breast cancer Maternal Aunt 37  . Cancer Maternal Aunt     ? ovarian  . Breast cancer Other 60    breast cancer, ovarian cancer  . Breast cancer Daughter 110    ROS:  Pertinent items are noted in HPI.  Otherwise, a comprehensive ROS was negative.  Exam:   BP 124/64  Pulse 60  Resp 20  Ht 5' 5.75" (1.67 m)  Wt 166 lb 3.2 oz (75.388 kg)  BMI 27.03 kg/m2  LMP 12/03/2002  Weight change: -1#  Height: 5' 5.75" (167 cm)  Ht Readings from Last 3 Encounters:  01/01/14 5' 5.75" (1.67 m)  05/13/13 _0  (1.702 m)  05/13/13 _1  (1.702 m)    General appearance: alert, cooperative and appears stated age Head: Normocephalic, without obvious abnormality, atraumatic Neck: no adenopathy, supple, symmetrical, trachea midline and thyroid normal to inspection and palpation Lungs: clear to auscultation bilaterally Breasts: normal appearance, no masses or tenderness Heart: regular rate and rhythm Abdomen: soft, non-tender; bowel sounds normal; no masses,  no organomegaly Extremities: extremities normal, atraumatic, no cyanosis or edema Skin: Skin color, texture, turgor normal. No rashes or lesions Lymph nodes: Cervical, supraclavicular, and axillary nodes normal. No abnormal inguinal nodes palpated Neurologic: Grossly normal   Pelvic: External genitalia:  no lesions              Urethra:  normal appearing urethra with no masses, tenderness or lesions              Bartholins and Skenes: normal                 Vagina: normal appearing vagina with normal color and discharge, no lesions              Cervix: no lesions              Pap taken: yes Bimanual  Exam:  Uterus:  normal size, contour, position, consistency, mobility, non-tender              Adnexa: normal adnexa and no mass, fullness, tenderness               Rectovaginal: Confirms               Anus:  normal sphincter tone, no lesions  A:  Well Woman with normal exam H/O nephrolithiasis Bladder cancer 2014.  Seeing Dr. Tammi Klippel.  Biopsy 7/14.  Treated with bladder chemo.  Repeat biopsies neg 9/14.  Having every three month cystoscopy.  H/O CHF H/O  COPD H/O glaucoma H/O DCIS s/p right mastectomy 4/04.  Daughter with ductal Br Ca.  Neg genetic testing in daughter. Hypertension  P:   Mammogram yearly. pap smear done today.  No HR HPV testing. Sees Dr. Minna Antis regularly. Vaccine UTD. return annually or prn  An After Visit Summary was printed and given to the patient.

## 2014-01-01 NOTE — Patient Instructions (Signed)

## 2014-01-06 LAB — IPS PAP SMEAR ONLY

## 2014-04-12 DIAGNOSIS — C679 Malignant neoplasm of bladder, unspecified: Secondary | ICD-10-CM | POA: Diagnosis not present

## 2014-05-13 DIAGNOSIS — K219 Gastro-esophageal reflux disease without esophagitis: Secondary | ICD-10-CM | POA: Diagnosis not present

## 2014-05-13 DIAGNOSIS — J449 Chronic obstructive pulmonary disease, unspecified: Secondary | ICD-10-CM | POA: Diagnosis not present

## 2014-05-13 DIAGNOSIS — E7800 Pure hypercholesterolemia, unspecified: Secondary | ICD-10-CM | POA: Diagnosis not present

## 2014-05-20 DIAGNOSIS — Z23 Encounter for immunization: Secondary | ICD-10-CM | POA: Diagnosis not present

## 2014-05-20 DIAGNOSIS — E559 Vitamin D deficiency, unspecified: Secondary | ICD-10-CM | POA: Diagnosis not present

## 2014-05-20 DIAGNOSIS — J449 Chronic obstructive pulmonary disease, unspecified: Secondary | ICD-10-CM | POA: Diagnosis not present

## 2014-05-20 DIAGNOSIS — K219 Gastro-esophageal reflux disease without esophagitis: Secondary | ICD-10-CM | POA: Diagnosis not present

## 2014-05-27 DIAGNOSIS — H4011X0 Primary open-angle glaucoma, stage unspecified: Secondary | ICD-10-CM | POA: Diagnosis not present

## 2014-05-27 DIAGNOSIS — H409 Unspecified glaucoma: Secondary | ICD-10-CM | POA: Diagnosis not present

## 2014-06-01 DIAGNOSIS — J441 Chronic obstructive pulmonary disease with (acute) exacerbation: Secondary | ICD-10-CM | POA: Diagnosis not present

## 2014-06-22 DIAGNOSIS — Z23 Encounter for immunization: Secondary | ICD-10-CM | POA: Diagnosis not present

## 2014-07-05 ENCOUNTER — Encounter: Payer: Self-pay | Admitting: Obstetrics & Gynecology

## 2014-07-19 ENCOUNTER — Other Ambulatory Visit: Payer: Self-pay | Admitting: Urology

## 2014-07-19 DIAGNOSIS — R310 Gross hematuria: Secondary | ICD-10-CM | POA: Diagnosis not present

## 2014-07-19 DIAGNOSIS — C674 Malignant neoplasm of posterior wall of bladder: Secondary | ICD-10-CM | POA: Diagnosis not present

## 2014-08-06 ENCOUNTER — Encounter (HOSPITAL_BASED_OUTPATIENT_CLINIC_OR_DEPARTMENT_OTHER): Payer: Self-pay | Admitting: *Deleted

## 2014-08-06 NOTE — Progress Notes (Signed)
NPO AFTER MN. ARRIVE AT 0900. NEEDS ISTAT 8 AND EKG. WILL TAKE PRILOSEC AM DOS W/ SIPS OF WATER.

## 2014-08-09 DIAGNOSIS — Z853 Personal history of malignant neoplasm of breast: Secondary | ICD-10-CM | POA: Diagnosis not present

## 2014-08-09 DIAGNOSIS — Z1231 Encounter for screening mammogram for malignant neoplasm of breast: Secondary | ICD-10-CM | POA: Diagnosis not present

## 2014-08-11 ENCOUNTER — Ambulatory Visit (HOSPITAL_BASED_OUTPATIENT_CLINIC_OR_DEPARTMENT_OTHER): Payer: Medicare Other | Admitting: Anesthesiology

## 2014-08-11 ENCOUNTER — Encounter (HOSPITAL_BASED_OUTPATIENT_CLINIC_OR_DEPARTMENT_OTHER)
Admission: RE | Disposition: A | Discharge status: Home / Self Care | Payer: Self-pay | Source: Ambulatory Visit | Attending: Urology

## 2014-08-11 ENCOUNTER — Ambulatory Visit (HOSPITAL_BASED_OUTPATIENT_CLINIC_OR_DEPARTMENT_OTHER)
Admission: RE | Admit: 2014-08-11 | Discharge: 2014-08-11 | Disposition: A | Discharge status: Home / Self Care | Payer: Medicare Other | Source: Ambulatory Visit | Attending: Urology | Admitting: Urology

## 2014-08-11 ENCOUNTER — Other Ambulatory Visit: Payer: Self-pay

## 2014-08-11 ENCOUNTER — Encounter (HOSPITAL_BASED_OUTPATIENT_CLINIC_OR_DEPARTMENT_OTHER): Payer: Self-pay | Admitting: *Deleted

## 2014-08-11 DIAGNOSIS — H409 Unspecified glaucoma: Secondary | ICD-10-CM | POA: Diagnosis not present

## 2014-08-11 DIAGNOSIS — J449 Chronic obstructive pulmonary disease, unspecified: Secondary | ICD-10-CM | POA: Insufficient documentation

## 2014-08-11 DIAGNOSIS — I509 Heart failure, unspecified: Secondary | ICD-10-CM | POA: Diagnosis not present

## 2014-08-11 DIAGNOSIS — C679 Malignant neoplasm of bladder, unspecified: Secondary | ICD-10-CM | POA: Insufficient documentation

## 2014-08-11 DIAGNOSIS — Z853 Personal history of malignant neoplasm of breast: Secondary | ICD-10-CM | POA: Insufficient documentation

## 2014-08-11 DIAGNOSIS — Z9011 Acquired absence of right breast and nipple: Secondary | ICD-10-CM | POA: Diagnosis not present

## 2014-08-11 DIAGNOSIS — N200 Calculus of kidney: Secondary | ICD-10-CM | POA: Insufficient documentation

## 2014-08-11 DIAGNOSIS — K219 Gastro-esophageal reflux disease without esophagitis: Secondary | ICD-10-CM | POA: Insufficient documentation

## 2014-08-11 DIAGNOSIS — Z885 Allergy status to narcotic agent status: Secondary | ICD-10-CM | POA: Insufficient documentation

## 2014-08-11 DIAGNOSIS — I10 Essential (primary) hypertension: Secondary | ICD-10-CM | POA: Insufficient documentation

## 2014-08-11 DIAGNOSIS — M1990 Unspecified osteoarthritis, unspecified site: Secondary | ICD-10-CM | POA: Diagnosis not present

## 2014-08-11 DIAGNOSIS — Z87891 Personal history of nicotine dependence: Secondary | ICD-10-CM | POA: Insufficient documentation

## 2014-08-11 HISTORY — DX: Personal history of other diseases of the circulatory system: Z86.79

## 2014-08-11 HISTORY — PX: CYSTOSCOPY W/ RETROGRADES: SHX1426

## 2014-08-11 HISTORY — DX: Personal history of other infectious and parasitic diseases: Z86.19

## 2014-08-11 HISTORY — PX: TRANSURETHRAL RESECTION OF BLADDER TUMOR WITH GYRUS (TURBT-GYRUS): SHX6458

## 2014-08-11 HISTORY — DX: Malignant neoplasm of bladder, unspecified: C67.9

## 2014-08-11 LAB — POCT I-STAT, CHEM 8
BUN: 21 mg/dL (ref 6–23)
CALCIUM ION: 1.2 mmol/L (ref 1.13–1.30)
Chloride: 101 mEq/L (ref 96–112)
Creatinine, Ser: 0.6 mg/dL (ref 0.50–1.10)
GLUCOSE: 96 mg/dL (ref 70–99)
HCT: 47 % — ABNORMAL HIGH (ref 36.0–46.0)
Hemoglobin: 16 g/dL — ABNORMAL HIGH (ref 12.0–15.0)
Potassium: 4.8 mEq/L (ref 3.7–5.3)
Sodium: 143 mEq/L (ref 137–147)
TCO2: 31 mmol/L (ref 0–100)

## 2014-08-11 SURGERY — TRANSURETHRAL RESECTION OF BLADDER TUMOR WITH GYRUS (TURBT-GYRUS)
Anesthesia: General | Site: Bladder

## 2014-08-11 MED ORDER — PROPOFOL 10 MG/ML IV BOLUS
INTRAVENOUS | Status: DC | PRN
  Administered 2014-08-11: 160 mg via INTRAVENOUS

## 2014-08-11 MED ORDER — TRAMADOL HCL 50 MG PO TABS: 50.0000 mg | ORAL_TABLET | Freq: Four times a day (QID) | ORAL | Status: DC | PRN

## 2014-08-11 MED ORDER — FENTANYL CITRATE 0.05 MG/ML IJ SOLN
INTRAMUSCULAR | Status: AC
  Filled 2014-08-11: qty 4

## 2014-08-11 MED ORDER — SENNOSIDES-DOCUSATE SODIUM 8.6-50 MG PO TABS: 1.0000 | ORAL_TABLET | Freq: Two times a day (BID) | ORAL | Status: DC

## 2014-08-11 MED ORDER — MITOMYCIN CHEMO FOR BLADDER INSTILLATION 40 MG
40.0000 mg | Freq: Once | INTRAVENOUS | Status: AC
  Administered 2014-08-11: 40 mg via INTRAVESICAL
  Filled 2014-08-11: qty 40

## 2014-08-11 MED ORDER — GENTAMICIN SULFATE 40 MG/ML IJ SOLN
369.5000 mg | INTRAVENOUS | Status: DC | PRN
  Administered 2014-08-11: 330 mg via INTRAVENOUS
  Administered 2014-08-11: 11:00:00 via INTRAVENOUS

## 2014-08-11 MED ORDER — DEXAMETHASONE SODIUM PHOSPHATE 4 MG/ML IJ SOLN
INTRAMUSCULAR | Status: DC | PRN
  Administered 2014-08-11: 10 mg via INTRAVENOUS

## 2014-08-11 MED ORDER — LACTATED RINGERS IV SOLN
INTRAVENOUS | Status: DC
  Administered 2014-08-11: 10:00:00 via INTRAVENOUS
  Filled 2014-08-11: qty 1000

## 2014-08-11 MED ORDER — ACETAMINOPHEN 10 MG/ML IV SOLN
INTRAVENOUS | Status: DC | PRN
  Administered 2014-08-11: 1000 mg via INTRAVENOUS

## 2014-08-11 MED ORDER — IOHEXOL 300 MG/ML  SOLN
INTRAMUSCULAR | Status: DC | PRN
  Administered 2014-08-11: 18 mL via INTRAVENOUS

## 2014-08-11 MED ORDER — LIDOCAINE HCL (CARDIAC) 20 MG/ML IV SOLN
INTRAVENOUS | Status: DC | PRN
  Administered 2014-08-11: 50 mg via INTRAVENOUS

## 2014-08-11 MED ORDER — SODIUM CHLORIDE 0.9 % IR SOLN
Status: DC | PRN
  Administered 2014-08-11: 3000 mL

## 2014-08-11 MED ORDER — EPHEDRINE SULFATE 50 MG/ML IJ SOLN
INTRAMUSCULAR | Status: DC | PRN
Start: 2014-08-11 — End: 2014-08-11
  Administered 2014-08-11: 10 mg via INTRAVENOUS

## 2014-08-11 SURGICAL SUPPLY — 26 items
BAG DRN ANRFLXCHMBR STRAP LEK (BAG)
BAG URINE DRAINAGE (UROLOGICAL SUPPLIES) IMPLANT
BAG URINE LEG 19OZ MD ST LTX (BAG) IMPLANT
BAG URINE LEG 500ML (DRAIN) IMPLANT
BAG URO CATCHER STRL LF (DRAPE) ×4 IMPLANT
BASKET ZERO TIP NITINOL 2.4FR (BASKET) IMPLANT
BSKT STON RTRVL ZERO TP 2.4FR (BASKET)
CATH FOLEY 2WAY SLVR  5CC 22FR (CATHETERS)
CATH FOLEY 2WAY SLVR 30CC 20FR (CATHETERS) IMPLANT
CATH FOLEY 2WAY SLVR 5CC 22FR (CATHETERS) IMPLANT
CATH INTERMIT  6FR 70CM (CATHETERS) IMPLANT
CLOTH BEACON ORANGE TIMEOUT ST (SAFETY) ×4 IMPLANT
DRAPE CAMERA CLOSED 9X96 (DRAPES) ×4 IMPLANT
ELECT LOOP MED HF 24F 12D CBL (CLIP) ×2 IMPLANT
ELECT REM PT RETURN 9FT ADLT (ELECTROSURGICAL) ×4
ELECTRODE REM PT RTRN 9FT ADLT (ELECTROSURGICAL) ×2 IMPLANT
EVACUATOR MICROVAS BLADDER (UROLOGICAL SUPPLIES) IMPLANT
GLOVE BIO SURGEON STRL SZ7.5 (GLOVE) ×4 IMPLANT
GLOVE SURG SS PI 7.5 STRL IVOR (GLOVE) ×4 IMPLANT
GOWN STRL REUS W/TWL LRG LVL3 (GOWN DISPOSABLE) ×4 IMPLANT
GUIDEWIRE ANG ZIPWIRE 038X150 (WIRE) IMPLANT
GUIDEWIRE STR DUAL SENSOR (WIRE) ×4 IMPLANT
IV NS IRRIG 3000ML ARTHROMATIC (IV SOLUTION) ×6 IMPLANT
PACK CYSTO (CUSTOM PROCEDURE TRAY) ×4 IMPLANT
SET ASPIRATION TUBING (TUBING) IMPLANT
SYRINGE IRR TOOMEY STRL 70CC (SYRINGE) IMPLANT

## 2014-08-11 NOTE — Discharge Instructions (Signed)
1 - You may have urinary urgency (bladder spasms) and bloody urine on / off x few days. This is normal.  2 - Call MD or go to ER for fever >102, severe pain / nausea / vomiting not relieved by medications, or acute change in medical status   Transurethral Resection, Bladder Tumor A cancerous growth (tumor) can develop on the inside wall of the bladder. The bladder is the organ that holds urine. One way to remove the tumor is a procedure called a transurethral resection. The tumor is removed (resected) through the tube that carries urine from the bladder out of the body (urethra). No cuts (incisions) are made in the skin. Instead, the procedure is done through a thin telescope, called a resectoscope. Attached to it is a light and usually a tiny camera. The resectoscope is put into the urethra. In men, the urethra opens at the end of the penis. In women, it opens just above the vagina.  A transurethral resection is usually used to remove tumors that have not gotten too big or too deep. These are called Stage 0, Stage 1 or Stage 2 bladder cancers. LET YOUR CAREGIVER KNOW ABOUT:  On the day of the procedure, your caregivers will need to know the last time you had anything to eat or drink. This includes water, gum, and candy. In advance, make sure they know about:   Any allergies.  All medications you are taking, including:  Herbs, eyedrops, over-the-counter medications and creams.  Blood thinners (anticoagulants), aspirin or other drugs that could affect blood clotting.  Use of steroids (by mouth or as creams).  Previous problems with anesthetics, including local anesthetics.  Possibility of pregnancy, if this applies.  Any history of blood clots.  Any history of bleeding or other blood problems.  Previous surgery.  Smoking history.  Any recent symptoms of colds or infections.  Other health problems. RISKS AND COMPLICATIONS This is usually a safe procedure. Every procedure has risks,  though. For a transurethral resection, they include:  Infection. Antibiotic medication would need to be taken.  Bleeding.  Light bleeding may last for several days after the procedure.  If bleeding continues or is heavy, the bladder may need rinsing. Or, a new catheter might be put in for awhile.  Sometimes bed rest is needed.  Urination problems.  Pain and burning can occur when urinating. This usually goes away in a few days.  Scarring from the procedure can block the flow of urine.  Bladder damage.  It can be punctured or torn during removal of the tumor. If this happens, a catheter might be needed for longer. Antibiotics would be taken while the bladder heals.  Urine can leak through the hole or tear into the abdomen. If this happens, surgery may be needed to repair the bladder. BEFORE THE PROCEDURE   A medical evaluation will be done. This may include:  A physical examination.  Urine test. This is to make sure you do not have a urinary tract infection.  Blood tests.  A test that checks the heart's rhythm (electrocardiogram).  Talking with an anesthesiologist. This is the person who will be in charge of the medication (anesthesia) to keep you from feeling pain during the transurethral resection. You might be asleep during the procedure (general anesthesia) or numb from the waist down, but awake during the procedure (spinal anesthesia). Ask your surgeon what to expect.  The person who is having a transurethral resection needs to give what is called informed  consent. This requires signing a legal paper that gives permission for the procedure. To give informed consent:  You must understand how the procedure is done and why.  You must be told all the risks and benefits of the procedure.  You must sign the consent. Sometimes a legal guardian can do this.  Signing should be witnessed by a healthcare professional.  The day before the surgery, eat only a light dinner. Then,  do not eat or drink anything for at least 8 hours before the surgery. Ask your caregiver if it is OK to take any needed medicines with a sip of water.  Arrive at least an hour before the surgery or whenever your surgeon recommends. This will give you time to check in and fill out any needed paperwork. PROCEDURE  The preparation:  You will change into a hospital gown.  A needle will be inserted in your arm. This is an intravenous access tube (IV). Medication will be able to flow directly into your body through this needle.  Small monitors will be put on your body. They are used to check your heart, blood pressure, and oxygen level.  You might be given medication that will help you relax (sedative).  You will be given a general anesthetic or spinal anesthesia.  The procedure:  Once you are asleep or numb from the waist down, your legs will be placed in stirrups.  The resectoscope will be passed through the urethra into the bladder.  Fluid will be passed through the resectoscope. This will fill the bladder with water.  The surgeon will examine the bladder through the scope. If the scope has a camera, it can take pictures from inside the bladder. They can be projected onto a TV screen.  The surgeon will use various tools to remove the tumor in small pieces. Sometimes a laser (a beam of light energy) is used. Other tools may use electric current.  A tube (catheter) will often be placed so that urine can drain into a bag outside the body. This process helps stop bleeding. This tube keeps blood clots from blocking the urethra.  The procedure usually takes 30 to 45 minutes. AFTER THE PROCEDURE   You will stay in a recovery area until the anesthesia has worn off. Your blood pressure and pulse will be checked every so often. Then you will be taken to a hospital room.  You may continue to get fluids through the IV for awhile.  Some pain is normal. The catheter might be uncomfortable. Pain  is usually not severe. If it is, ask for pain medicine.  Your urine may look bloody after a transurethral resection. This is normal.  If bleeding is heavy, a hospital caregiver may rinse out the bladder (irrigation) through the catheter.  Once the urine is clear, the catheter will be taken out.  You will need to stay in the hospital until you can urinate on your own.  Most people stay in the hospital for up to 4 days. PROGNOSIS   Transurethral resection is considered the best way to treat bladder tumors that are not too far along. For most people, the treatment is successful. Sometimes, though, more treatment is needed.  Bladder cancers can come back even after a successful procedure. Because of this, be sure to have a checkup with your caregiver every 3 to 6 months. If everything is OK for 3 years, you can reduce the checkups to once a year. Document Released: 06/16/2009 Document Revised: 11/12/2011 Document Reviewed:  09/29/2013 ExitCare Patient Information 2015 [place], [place]. This information is not intended to replace advice given to you by your health care provider. Make sure you discuss any questions you have with your health care provider.    CYSTOSCOPY HOME CARE INSTRUCTIONS  Activity: Rest for the remainder of the day.  Do not drive or operate equipment today.  You may resume normal activities in one to two days as instructed by your physician.   Meals: Drink plenty of liquids and eat light foods such as gelatin or soup this evening.  You may return to a normal meal plan tomorrow.  Return to Work: You may return to work in one to two days or as instructed by your physician.  Special Instructions / Symptoms: Call your physician if any of these symptoms occur:   -persistent or heavy bleeding  -bleeding which continues after first few urination  -large blood clots that are difficult to pass  -urine stream diminishes or stops completely  -fever equal to or higher than 102  degrees Farenheit.  -cloudy urine with a strong, foul odor  -severe pain  Females should always wipe from front to back after elimination.  You may feel some burning pain when you urinate.  This should disappear with time.  Applying moist heat to the lower abdomen or a hot tub bath may help relieve the pain. \      Post Anesthesia Home Care Instructions  Activity: Get plenty of rest for the remainder of the day. A responsible adult should stay with you for 24 hours following the procedure.  For the next 24 hours, DO NOT: -Drive a car -Paediatric nurse -Drink alcoholic beverages -Take any medication unless instructed by your physician -Make any legal decisions or sign important papers.  Meals: Start with liquid foods such as gelatin or soup. Progress to regular foods as tolerated. Avoid greasy, spicy, heavy foods. If nausea and/or vomiting occur, drink only clear liquids until the nausea and/or vomiting subsides. Call your physician if vomiting continues.  Special Instructions/Symptoms: Your throat may feel dry or sore from the anesthesia or the breathing tube placed in your throat during surgery. If this causes discomfort, gargle with warm salt water. The discomfort should disappear within 24 hours.

## 2014-08-11 NOTE — Transfer of Care (Signed)
Immediate Anesthesia Transfer of Care Note  Patient: [name]  Procedure(s) Performed: Procedure(s): TRANSURETHRAL RESECTION OF BLADDER TUMOR WITH GYRUS (TURBT-GYRUS) (N/A) CYSTOSCOPY WITH BILATERAL RETROGRADE PYELOGRAM AND MITOMYCIN INSTILLATION (Bilateral)  Patient Location: PACU  Anesthesia Type:General  Level of Consciousness: awake, alert  and oriented  Airway & Oxygen Therapy: Patient Spontanous Breathing and Patient connected to nasal cannula oxygen  Post-op Assessment: Report given to PACU RN  Post vital signs: Reviewed and stable  Complications: No apparent anesthesia complications

## 2014-08-11 NOTE — H&P (Signed)
[name] is an 79 y.o. female.    Chief Complaint: Pre-op Transurethral Resection of BLadder TUmor  HPI:   1 - High Grade Bladder Cancer - Prior 40PY smoker.  03/2013 - TURBT + Lt JJ Stent - TaG3 multifocal, muscle present. CT Urogram w/o distant lesions. 05/2013 - reTURBT no disease present --> FULL COUSE Induction BCG x 6  09/2013 - NED by surv cysto; 12/2013 - NED by surv cysto; 04/2014 - NED by surv cysto;  07/2014 - Two small papillary tumors posterior, each about 1 cm.   2 - Nephrolithiasis - Had left ureteral stone 2012 managed wtih medical therapy. CT 2014 with punctate left lower pole stone only.  PMH sig for breast CA (mastectomy), Knee surgery. COPD (No O2). No CV disease. No strong blood thinners. She has beach house that she enjoys year round.   Today [name] is seen to proceed with TURBT for her recurrent bladder cancer.  No interval feers.    Past Medical History  Diagnosis Date  . History of CHF (congestive heart failure)     POST SURG 2010  . COPD (chronic obstructive pulmonary disease)   . History of breast cancer     DX DCIS IN 2004--  S/P RIGHT MASTECTOMY , NO CHEMORADIATION---  NO RECURRENCE  . Glaucoma of both eyes   . History of atrial fibrillation without current medication 2010    POST SURG 2010--  PER PT NO ISSUES SINCE  . Arthritis   . Dyspnea on exertion   . GERD (gastroesophageal reflux disease)   . Nephrolithiasis   . History of shingles     02/ 2015--  back of neck and left flank--  no residual pain  . History of hypertension   . Recurrent bladder papillary carcinoma first dx 06/ 2014    s/p  turbt's  and instillation mitomycin c (chemo)    Past Surgical History  Procedure Laterality Date  . Total knee arthroplasty Right 03-22-2009  . Partial mastectomy with needle localization Right 01-14-2003    DCIS  . Total mastectomy Right 02-03-2003    W/ SLN BX  AND  POST 03-01-2003 EVACUATION HEMATOMA  . Removal cyst left hand  2013  .  Transthoracic echocardiogram  03-25-2009    MILD LVF/ EF 70-80%/ MILD INCREASE SYSTOLIC PULMONARY  PRESSURE  . Transurethral resection of bladder tumor with gyrus (turbt-gyrus) N/A 03/26/2013    Procedure: TRANSURETHRAL RESECTION OF BLADDER TUMOR WITH GYRUS (TURBT-GYRUS);  Surgeon: Alexis Frock, MD;  Location: [facility];  Service: Urology;  Laterality: N/A;  . Cystoscopy w/ ureteral stent placement Bilateral 03/26/2013    Procedure: CYSTOSCOPY WITH BILATERAL RETROGRADE PYELOGRAM/URETERAL STENT PLACEMENT;  Surgeon: Alexis Frock, MD;  Location: [facility];  Service: Urology;  Laterality: Bilateral;  . Transurethral resection of bladder tumor with gyrus (turbt-gyrus) N/A 05/13/2013    Procedure: TRANSURETHRAL RESECTION OF BLADDER TUMOR WITH GYRUS (TURBT-GYRUS)  RE-STAGING TRANSURETHRAL RESECTION OF BLADDER TUMOR, LEFT RETROGRADE PYELOGRAM AND STENT EXCHANGE;  Surgeon: Alexis Frock, MD;  Location: [facility];  Service: Urology;  Laterality: N/A;  . Cystoscopy w/ ureteral stent placement Left 05/13/2013    Procedure: CYSTOSCOPY WITH RETROGRADE PYELOGRAM/URETERAL STENT PLACEMENT STENT EXCHANGE;  Surgeon: Alexis Frock, MD;  Location: [facility];  Service: Urology;  Laterality: Left;  . Tubal ligation    . Cataract extraction w/ intraocular lens  implant, bilateral      Family History  Problem Relation Age of Onset  .  Colon cancer Mother 3  . Colon cancer Maternal Grandmother   . Breast cancer Maternal Aunt 31  . Cancer Maternal Aunt     ? ovarian  . Breast cancer Other 60    breast cancer, ovarian cancer  . Breast cancer Daughter 5   Social History:  reports that she quit smoking about 11 years ago. Her smoking use included Cigarettes. She has a 40 pack-year smoking history. She has never used smokeless tobacco. She reports that she does not drink alcohol or use illicit drugs.  Allergies:  Allergies  Allergen Reactions  .  Morphine And Related Nausea And Vomiting    No prescriptions prior to admission    No results found for this or any previous visit (from the past 48 hour(s)). No results found.  Review of Systems  Constitutional: Negative.  Negative for fever and chills.  HENT: Negative.   Eyes: Negative.   Respiratory: Negative.   Cardiovascular: Negative.   Gastrointestinal: Negative.   Genitourinary: Negative.   Musculoskeletal: Negative.   Skin: Negative.   Neurological: Negative.   Endo/Heme/Allergies: Negative.   Psychiatric/Behavioral: Negative.     Height 5' 5.75" (1.67 m), weight 74.844 kg (165 lb), last menstrual period 12/03/2002. Physical Exam  Constitutional: She appears well-developed.  HENT:  Head: Normocephalic.  Eyes: Pupils are equal, round, and reactive to light.  Neck: Normal range of motion.  Cardiovascular: Normal rate.   Respiratory: Effort normal.  GI: Soft.  Genitourinary:  No CVAT  Musculoskeletal: Normal range of motion.  Neurological: She is alert.  Skin: Skin is warm.  Psychiatric: She has a normal mood and affect. Her behavior is normal. Judgment and thought content normal.     Assessment/Plan     1 - High Grade Bladder Cancer -  Cysto with likely early recurrence, rec TURBT which she has tollerated well in the past.  We rediscussed operative biopsy / transurethral resection as the best next step for diagnostic and therapeutic purposes with goals being to remove all visible cancer and obtain tissue for pathologic exam. We rediscussed that for some low-grade tumors, this may be all the treatment required, but that for many other tumors such as high-grade lesions, further therapy including surgery and or chemotherapy may be warranted. We also reoutlined the fact that any bladder cancer diagnosis will require close follow-up with periodic upper and lower tract evaluation. We rediscussed risks including bleeding, infection, damage to kidney / ureter / bladder  including bladder perforation which can typically managed with prolonged foley catheterization. We rementioned anesthetic and other rare risks including DVT, PE, MI, and mortality. I also mentioned that adjunctive procedures such as ureteral stenting, retrograde pyelography, and ureteroscopy may be necessary to fully evaluate the urinary tract depending on intra-operative findings.   After answering all questions to the patient's satisfaction, they wish to proceed today as planned.    2 - Nephrolithiasis - tiny non-obstructing stone by most recent CT. Low risk. Observe.   Byron Peacock 08/11/2014, 6:08 AM

## 2014-08-11 NOTE — Anesthesia Postprocedure Evaluation (Signed)
Anesthesia Post Note  Patient: [name]  Procedure(s) Performed: Procedure(s) (LRB): TRANSURETHRAL RESECTION OF BLADDER TUMOR WITH GYRUS (TURBT-GYRUS) (N/A) CYSTOSCOPY WITH BILATERAL RETROGRADE PYELOGRAM AND MITOMYCIN INSTILLATION (Bilateral)  Anesthesia type: General  Patient location: PACU  Post pain: Pain level controlled  Post assessment: Post-op Vital signs reviewed  Last Vitals: BP 98/57 mmHg  Pulse 56  Temp(Src) 36.4 C (Oral)  Resp 13  Ht _0  (1.702 m)  Wt 163 lb (73.936 kg)  BMI 25.52 kg/m2  SpO2 93%  LMP 12/03/2002  Post vital signs: Reviewed  Level of consciousness: sedated  Complications: No apparent anesthesia complications

## 2014-08-11 NOTE — Anesthesia Preprocedure Evaluation (Signed)
Anesthesia Evaluation  Patient identified by MRN, date of birth, ID band Patient awake    Reviewed: Allergy & Precautions, H&P , NPO status , Patient's Chart, lab work & pertinent test results  Airway Mallampati: III  TM Distance: <3 FB Neck ROM: Full    Dental no notable dental hx.    Pulmonary shortness of breath and with exertion, COPDformer smoker,  breath sounds clear to auscultation  Pulmonary exam normal       Cardiovascular hypertension, Pt. on medications +CHF Rhythm:Regular Rate:Normal     Neuro/Psych negative neurological ROS  negative psych ROS   GI/Hepatic Neg liver ROS, GERD-  Medicated,  Endo/Other  negative endocrine ROS  Renal/GU Renal disease     Musculoskeletal  (+) Arthritis -,   Abdominal   Peds  Hematology negative hematology ROS (+)   Anesthesia Other Findings   Reproductive/Obstetrics negative OB ROS                             Anesthesia Physical  Anesthesia Plan  ASA: III  Anesthesia Plan: General   Post-op Pain Management:    Induction: Intravenous  Airway Management Planned: LMA  Additional Equipment: None  Intra-op Plan:   Post-operative Plan: Extubation in OR  Informed Consent: I have reviewed the patients History and Physical, chart, labs and discussed the procedure including the risks, benefits and alternatives for the proposed anesthesia with the patient or authorized representative who has indicated his/her understanding and acceptance.   Dental advisory given  Plan Discussed with: CRNA  Anesthesia Plan Comments:         Anesthesia Quick Evaluation

## 2014-08-11 NOTE — Anesthesia Procedure Notes (Signed)
Procedure Name: LMA Insertion Date/Time: 08/11/2014 10:30 AM Performed by: Bethena Roys T Pre-anesthesia Checklist: Patient identified, Emergency Drugs available, Suction available and Patient being monitored Patient Re-evaluated:Patient Re-evaluated prior to inductionOxygen Delivery Method: Circle System Utilized Preoxygenation: Pre-oxygenation with 100% oxygen Intubation Type: IV induction Ventilation: Mask ventilation without difficulty LMA: LMA inserted LMA Size: 4.0 Number of attempts: 1 Airway Equipment and Method: bite block Placement Confirmation: positive ETCO2 Tube secured with: Tape Dental Injury: Teeth and Oropharynx as per pre-operative assessment

## 2014-08-11 NOTE — Brief Op Note (Signed)
08/11/2014  10:56 AM  PATIENT:  [name]  79 y.o. female  PRE-OPERATIVE DIAGNOSIS:  RECURRENT SMALL VOLUME BLADDER CANCER  POST-OPERATIVE DIAGNOSIS:  * No post-op diagnosis entered *  PROCEDURE:  Procedure(s): TRANSURETHRAL RESECTION OF BLADDER TUMOR WITH GYRUS (TURBT-GYRUS) (N/A) CYSTOSCOPY WITH BILATERAL RETROGRADE PYELOGRAM AND MITOMYCIN INSTILLATION (Bilateral)  SURGEON:  Surgeon(s) and Role:    * Alexis Frock, MD - Primary  PHYSICIAN ASSISTANT:   ASSISTANTS: none   ANESTHESIA:   general  EBL:  Total I/O In: 297.6 [I.V.:200; IV Piggyback:97.6] Out: -   BLOOD ADMINISTERED:none  DRAINS: 56F foley, capped   LOCAL MEDICATIONS USED:  NONE  SPECIMEN:  Source of Specimen:  1 - bladder tumor, 2 - base of bladder tumor  DISPOSITION OF SPECIMEN:  PATHOLOGY  COUNTS:  YES  TOURNIQUET:  * No tourniquets in log *  DICTATION: .Other Dictation: Dictation Number [id number]  PLAN OF CARE: Discharge to home after PACU  PATIENT DISPOSITION:  PACU - hemodynamically stable.   Delay start of Pharmacological VTE agent (>24hrs) due to surgical blood loss or risk of bleeding: not applicable

## 2014-08-12 ENCOUNTER — Encounter (HOSPITAL_BASED_OUTPATIENT_CLINIC_OR_DEPARTMENT_OTHER): Payer: Self-pay | Admitting: Urology

## 2014-08-13 NOTE — Op Note (Signed)
NAME:  SHARIECE, VIVEIROS NO.:  [id number]  MEDICAL RECORD NO.:  [id number]  LOCATION:                                 FACILITY:  PHYSICIAN:  Alexis Frock, MD     DATE OF BIRTH:  12-21-1934  DATE OF PROCEDURE:  08/11/2014 DATE OF DISCHARGE:  08/11/2014                              OPERATIVE REPORT   DIAGNOSIS:  Recurrent bladder cancer.  PROCEDURE: 1. Transurethral resection of bladder tumor, volume medium. 2. Bilateral retrograde pyelogram with interpretation. 3. Instillation of chemotherapeutic mitomycin-C.  ESTIMATED BLOOD LOSS:  Nil.  COMPLICATIONS:  None.  SPECIMEN: 1. Bladder tumor. 2. Base of bladder tumor.  FINDINGS: 1. Two papillary tumors in the bladder, posterior, total volume     approximately 2.5 cm.  These were papillary and appeared     superficial. 2. No evidence of perforation following resection of tumor and biopsy     of base.  INDICATION:  Ms. Clinger is a pleasant 79 year old lady with history of superficial high-grade bladder cancer.  She underwent prior transurethral resection and induction BCG on surveillance cystoscopy. She was found to have a local recurrence with 2 small papillary tumors noted on her last office cystoscopy.  Options were discussed including surveillance versus transurethral resection for diagnostic and treatment purposes and she wished to proceed with the latter.  Informed consent was obtained and placed in medical record.  DESCRIPTION OF PROCEDURE:  The patient being Monicia Tse verified. The procedure being transurethral resection of bladder tumor, instillation of mitomycin, bilateral retrograde pyelogram was confirmed. Procedure was carried out.  Time-out was performed.  Intravenous antibiotics administered.  General LMA anesthesia was induced.  The patient was placed into a low lithotomy position.  Sterile field was created by prepping and draping the patient's vagina, introitus, and proximal  thighs using iodine x3.  Next, cystourethroscopy was performed using a 26-French resectoscope sheath with visual obturator and 12 degree lens.  Inspection of the urinary bladder revealed some moderate descensus.  Ureteral orifices were in the normal anatomic position with efflux of clear urine.  There were 2 small papillary tumors, total volume approximately 2.5 cm, both in the posterior aspect of the bladder.  These appeared to be small and superficial.  There were no additional calcifications, papillary lesions, whatsoever.  Attention was initially directed at retrograde pyelography, first on the right side. The right ureteral orifice was cannulated with a 6-French end-hole catheter and right retrograde pyelogram was obtained.  Right retrograde pyelogram demonstrates a single right ureter, single system right kidney.  No filling defects or narrowing noted.  Similarly, left retrograde pyelogram was obtained.  Left retrograde pyelogram demonstrates a single left ureter, single system left kidney.  No filling defects or narrowing noted.  The bipolar resectoscope loop size medium was then used to very carefully resect the papillary bladder tumors down to what appeared to be the seromuscular layer of the urinary bladder.  These bladder tumor fragments were irrigated and set aside for permanent pathology labelled as bladder tumor.  Cold cup biopsy forceps were used to obtain a deep tissue, what appeared to be the seromuscular layer behind these tumors and these were set aside for permanent  pathology, labeled base of bladder tumor. Additional coagulation current was applied to the base as well as the surrounding surface of the prior tumor.  There was no evidence of perforation noted.  It was felt that it would be safe to proceed with insufflation mitomycin to help prevent recurrence.  As such, a 16-French Foley catheter was placed per urethra to straight drain, 10 mL of sterile water in the  balloon, this allowed to irrigate.  The patient was awakened and taken to the postanesthesia care unit.  In the postanesthesia care unit, instillation of mitomycin was performed.  40 mg of mitomycin and 40 mL sterile water were instilled via Foley catheter using aseptic technique, taking great care to avoid injury, which did not occur.  This was allowed to remain the patient's bladder approximately 1 hour after which it was drained and the catheter was removed.  The patient tolerated the procedure well, there were no immediate periprocedural complications.  The patient was taken to postanesthesia care unit in stable condition.          ______________________________ Alexis Frock, MD     TM/MEDQ  D:  08/11/2014  T:  08/11/2014  Job:  [id number]

## 2014-08-24 DIAGNOSIS — C679 Malignant neoplasm of bladder, unspecified: Secondary | ICD-10-CM | POA: Diagnosis not present

## 2014-08-24 DIAGNOSIS — N200 Calculus of kidney: Secondary | ICD-10-CM | POA: Diagnosis not present

## 2014-11-02 DIAGNOSIS — E559 Vitamin D deficiency, unspecified: Secondary | ICD-10-CM | POA: Diagnosis not present

## 2014-11-02 DIAGNOSIS — E780 Pure hypercholesterolemia: Secondary | ICD-10-CM | POA: Diagnosis not present

## 2014-11-02 DIAGNOSIS — J449 Chronic obstructive pulmonary disease, unspecified: Secondary | ICD-10-CM | POA: Diagnosis not present

## 2014-11-02 LAB — LIPID PANEL
Cholesterol: 207 mg/dL — AB (ref 0–200)
HDL: 58 mg/dL (ref 35–70)
LDL CALC: 127 mg/dL
LDl/HDL Ratio: 2.2
TRIGLYCERIDES: 112 mg/dL (ref 40–160)

## 2014-11-02 LAB — HEPATIC FUNCTION PANEL
ALT: 33 U/L (ref 7–35)
AST: 25 U/L (ref 13–35)
Alkaline Phosphatase: 125 U/L (ref 25–125)
Bilirubin, Total: 0.4 mg/dL

## 2014-11-02 LAB — BASIC METABOLIC PANEL
BUN: 21 mg/dL (ref 4–21)
Creatinine: 0.7 mg/dL (ref 0.5–1.1)
Glucose: 92 mg/dL
Sodium: 142 mmol/L (ref 137–147)

## 2014-11-02 LAB — TSH: TSH: 1.72 u[IU]/mL (ref 0.41–5.90)

## 2014-11-02 LAB — CBC AND DIFFERENTIAL
HCT: 46 % (ref 36–46)
Hemoglobin: 14.6 g/dL (ref 12.0–16.0)
Platelets: 200 10*3/uL (ref 150–399)
WBC: 7.2 10*3/mL

## 2014-11-08 DIAGNOSIS — E780 Pure hypercholesterolemia: Secondary | ICD-10-CM | POA: Diagnosis not present

## 2014-11-08 DIAGNOSIS — Z1389 Encounter for screening for other disorder: Secondary | ICD-10-CM | POA: Diagnosis not present

## 2014-11-08 DIAGNOSIS — Z0000 Encounter for general adult medical examination without abnormal findings: Secondary | ICD-10-CM | POA: Diagnosis not present

## 2014-11-08 DIAGNOSIS — K219 Gastro-esophageal reflux disease without esophagitis: Secondary | ICD-10-CM | POA: Diagnosis not present

## 2014-11-09 ENCOUNTER — Other Ambulatory Visit: Payer: Self-pay | Admitting: Gastroenterology

## 2014-11-09 DIAGNOSIS — R1013 Epigastric pain: Secondary | ICD-10-CM | POA: Diagnosis not present

## 2014-11-09 DIAGNOSIS — Z800 Family history of malignant neoplasm of digestive organs: Secondary | ICD-10-CM | POA: Diagnosis not present

## 2014-11-09 DIAGNOSIS — R194 Change in bowel habit: Secondary | ICD-10-CM | POA: Diagnosis not present

## 2014-11-16 ENCOUNTER — Other Ambulatory Visit: Payer: Self-pay | Admitting: Dermatology

## 2014-11-16 DIAGNOSIS — Z85828 Personal history of other malignant neoplasm of skin: Secondary | ICD-10-CM | POA: Diagnosis not present

## 2014-11-16 DIAGNOSIS — D485 Neoplasm of uncertain behavior of skin: Secondary | ICD-10-CM | POA: Diagnosis not present

## 2014-11-16 DIAGNOSIS — L309 Dermatitis, unspecified: Secondary | ICD-10-CM | POA: Diagnosis not present

## 2014-11-16 DIAGNOSIS — L859 Epidermal thickening, unspecified: Secondary | ICD-10-CM | POA: Diagnosis not present

## 2014-11-16 DIAGNOSIS — L821 Other seborrheic keratosis: Secondary | ICD-10-CM | POA: Diagnosis not present

## 2014-11-17 DIAGNOSIS — J029 Acute pharyngitis, unspecified: Secondary | ICD-10-CM | POA: Diagnosis not present

## 2014-11-18 DIAGNOSIS — Z1211 Encounter for screening for malignant neoplasm of colon: Secondary | ICD-10-CM | POA: Diagnosis not present

## 2014-11-18 DIAGNOSIS — Z800 Family history of malignant neoplasm of digestive organs: Secondary | ICD-10-CM | POA: Diagnosis not present

## 2014-12-07 DIAGNOSIS — J449 Chronic obstructive pulmonary disease, unspecified: Secondary | ICD-10-CM | POA: Diagnosis not present

## 2014-12-07 DIAGNOSIS — J0110 Acute frontal sinusitis, unspecified: Secondary | ICD-10-CM | POA: Diagnosis not present

## 2014-12-07 DIAGNOSIS — R0981 Nasal congestion: Secondary | ICD-10-CM | POA: Diagnosis not present

## 2014-12-20 ENCOUNTER — Telehealth: Payer: Self-pay | Admitting: Internal Medicine

## 2014-12-20 ENCOUNTER — Other Ambulatory Visit: Payer: Medicare Other

## 2014-12-20 NOTE — Telephone Encounter (Signed)
Patient's doctor is leaving and a friend had recommended you. Are you able to take her on as a new patient.

## 2014-12-20 NOTE — Telephone Encounter (Signed)
sure

## 2014-12-21 DIAGNOSIS — C679 Malignant neoplasm of bladder, unspecified: Secondary | ICD-10-CM | POA: Diagnosis not present

## 2014-12-21 DIAGNOSIS — N200 Calculus of kidney: Secondary | ICD-10-CM | POA: Diagnosis not present

## 2014-12-23 DIAGNOSIS — Z961 Presence of intraocular lens: Secondary | ICD-10-CM | POA: Diagnosis not present

## 2014-12-23 DIAGNOSIS — H43813 Vitreous degeneration, bilateral: Secondary | ICD-10-CM | POA: Diagnosis not present

## 2014-12-23 DIAGNOSIS — H4011X2 Primary open-angle glaucoma, moderate stage: Secondary | ICD-10-CM | POA: Diagnosis not present

## 2014-12-30 ENCOUNTER — Ambulatory Visit (INDEPENDENT_AMBULATORY_CARE_PROVIDER_SITE_OTHER): Payer: Medicare Other | Admitting: Internal Medicine

## 2014-12-30 ENCOUNTER — Encounter: Payer: Self-pay | Admitting: Internal Medicine

## 2014-12-30 VITALS — BP 128/88 | HR 69 | Resp 16 | Ht 67.0 in | Wt 162.0 lb

## 2014-12-30 DIAGNOSIS — J449 Chronic obstructive pulmonary disease, unspecified: Secondary | ICD-10-CM | POA: Diagnosis not present

## 2014-12-30 DIAGNOSIS — K219 Gastro-esophageal reflux disease without esophagitis: Secondary | ICD-10-CM

## 2014-12-30 DIAGNOSIS — IMO0001 Reserved for inherently not codable concepts without codable children: Secondary | ICD-10-CM

## 2014-12-30 DIAGNOSIS — C672 Malignant neoplasm of lateral wall of bladder: Secondary | ICD-10-CM | POA: Diagnosis not present

## 2014-12-30 NOTE — Progress Notes (Signed)
Subjective:    Patient ID: [name], female    DOB: 1935/07/24, 80 y.o.   MRN: [id number]  Gastrophageal Reflux She reports no abdominal pain, no belching, no chest pain, no choking, no coughing, no dysphagia, no early satiety, no globus sensation, no heartburn, no hoarse voice, no nausea, no sore throat, no stridor, no tooth decay, no water brash or no wheezing. This is a chronic problem. The current episode started more than 1 year ago. The problem has been unchanged. Nothing aggravates the symptoms. Pertinent negatives include no anemia, fatigue, melena, muscle weakness, orthopnea or weight loss. There are no known risk factors. She has tried a PPI for the symptoms. The treatment provided significant relief. Past procedures include an EGD.      Review of Systems  Constitutional: Negative.  Negative for fever, chills, weight loss, diaphoresis, appetite change and fatigue.  HENT: Negative.  Negative for hoarse voice and sore throat.   Eyes: Negative.   Respiratory: Negative.  Negative for cough, choking and wheezing.   Cardiovascular: Negative.  Negative for chest pain and leg swelling.  Gastrointestinal: Negative.  Negative for heartburn, dysphagia, nausea, vomiting, abdominal pain, diarrhea, constipation, blood in stool and melena.  Endocrine: Negative.   Genitourinary: Negative.   Musculoskeletal: Negative.  Negative for back pain, joint swelling, arthralgias and muscle weakness.  Skin: Negative.  Negative for rash.  Allergic/Immunologic: Negative.   Neurological: Negative.   Hematological: Negative.  Negative for adenopathy. Does not bruise/bleed easily.  Psychiatric/Behavioral: Negative.        Objective:   Physical Exam  Constitutional: She is oriented to person, place, and time. She appears well-developed and well-nourished. No distress.  HENT:  Head: Normocephalic and atraumatic.  Mouth/Throat: Oropharynx is clear and moist. No oropharyngeal exudate.  Eyes:  Conjunctivae are normal. Right eye exhibits no discharge. Left eye exhibits no discharge. No scleral icterus.  Neck: Normal range of motion. Neck supple. No JVD present. No tracheal deviation present. No thyromegaly present.  Cardiovascular: Normal rate, regular rhythm, normal heart sounds and intact distal pulses.  Exam reveals no gallop and no friction rub.   No murmur heard. Pulmonary/Chest: Effort normal and breath sounds normal. No stridor. No respiratory distress. She has no wheezes. She has no rales. She exhibits no tenderness.  Abdominal: Soft. Bowel sounds are normal. She exhibits no distension and no mass. There is no tenderness. There is no rebound and no guarding.  Musculoskeletal: Normal range of motion. She exhibits no edema or tenderness.  Lymphadenopathy:    She has no cervical adenopathy.  Neurological: She is oriented to person, place, and time.  Skin: Skin is warm and dry. No rash noted. She is not diaphoretic. No erythema. No pallor.  Psychiatric: She has a normal mood and affect. Her behavior is normal. Judgment and thought content normal.  Vitals reviewed.    Lab Results  Component Value Date   WBC 7.2 11/02/2014   HGB 14.6 11/02/2014   HCT 46 11/02/2014   PLT 200 11/02/2014   GLUCOSE 96 08/11/2014   CHOL 207* 11/02/2014   TRIG 112 11/02/2014   HDL 58 11/02/2014   LDLCALC 127 11/02/2014   ALT 33 11/02/2014   AST 25 11/02/2014   NA 142 11/02/2014   K 4.8 08/11/2014   CL 101 08/11/2014   CREATININE 0.7 11/02/2014   BUN 21 11/02/2014   CO2 28 03/29/2009   TSH 1.72 11/02/2014   INR 1.9* 03/30/2009       Assessment &  Plan:

## 2014-12-30 NOTE — Progress Notes (Signed)
Pre visit review using our clinic review tool, if applicable. No additional management support is needed unless otherwise documented below in the visit note.

## 2014-12-31 ENCOUNTER — Telehealth: Payer: Self-pay | Admitting: Internal Medicine

## 2015-01-02 NOTE — Assessment & Plan Note (Signed)
This appears stable

## 2015-01-02 NOTE — Assessment & Plan Note (Signed)
This is stable She has decided not to use any inhalers

## 2015-01-10 ENCOUNTER — Ambulatory Visit: Payer: Self-pay | Admitting: Obstetrics & Gynecology

## 2015-01-10 ENCOUNTER — Telehealth: Payer: Self-pay

## 2015-01-10 NOTE — Telephone Encounter (Signed)
lmtcb-needs to reschedule AEX today, due to provider in surgery.//kn

## 2015-01-20 DIAGNOSIS — H4011X2 Primary open-angle glaucoma, moderate stage: Secondary | ICD-10-CM | POA: Diagnosis not present

## 2015-02-05 ENCOUNTER — Inpatient Hospital Stay (HOSPITAL_COMMUNITY): Payer: Medicare Other | Admitting: Anesthesiology

## 2015-02-05 ENCOUNTER — Encounter (HOSPITAL_COMMUNITY): Payer: Self-pay | Admitting: Family Medicine

## 2015-02-05 ENCOUNTER — Emergency Department (HOSPITAL_COMMUNITY): Payer: Medicare Other

## 2015-02-05 ENCOUNTER — Inpatient Hospital Stay (HOSPITAL_COMMUNITY)
Admission: EM | Admit: 2015-02-05 | Discharge: 2015-02-14 | DRG: 023 | Disposition: A | Discharge status: Rehabilitation Facility | Payer: Medicare Other | Attending: Neurosurgery | Admitting: Neurosurgery

## 2015-02-05 ENCOUNTER — Encounter (HOSPITAL_COMMUNITY)
Admission: EM | Disposition: A | Discharge status: Rehabilitation Facility | Payer: Self-pay | Source: Home / Self Care | Attending: Neurosurgery

## 2015-02-05 DIAGNOSIS — N390 Urinary tract infection, site not specified: Secondary | ICD-10-CM | POA: Diagnosis not present

## 2015-02-05 DIAGNOSIS — Z87891 Personal history of nicotine dependence: Secondary | ICD-10-CM | POA: Diagnosis not present

## 2015-02-05 DIAGNOSIS — Z96651 Presence of right artificial knee joint: Secondary | ICD-10-CM | POA: Diagnosis present

## 2015-02-05 DIAGNOSIS — E663 Overweight: Secondary | ICD-10-CM | POA: Diagnosis present

## 2015-02-05 DIAGNOSIS — E876 Hypokalemia: Secondary | ICD-10-CM | POA: Insufficient documentation

## 2015-02-05 DIAGNOSIS — E0781 Sick-euthyroid syndrome: Secondary | ICD-10-CM | POA: Diagnosis present

## 2015-02-05 DIAGNOSIS — I6789 Other cerebrovascular disease: Secondary | ICD-10-CM | POA: Diagnosis not present

## 2015-02-05 DIAGNOSIS — I5032 Chronic diastolic (congestive) heart failure: Secondary | ICD-10-CM | POA: Diagnosis not present

## 2015-02-05 DIAGNOSIS — G919 Hydrocephalus, unspecified: Secondary | ICD-10-CM | POA: Diagnosis not present

## 2015-02-05 DIAGNOSIS — I619 Nontraumatic intracerebral hemorrhage, unspecified: Secondary | ICD-10-CM

## 2015-02-05 DIAGNOSIS — Z9221 Personal history of antineoplastic chemotherapy: Secondary | ICD-10-CM

## 2015-02-05 DIAGNOSIS — G936 Cerebral edema: Secondary | ICD-10-CM | POA: Diagnosis not present

## 2015-02-05 DIAGNOSIS — Z9011 Acquired absence of right breast and nipple: Secondary | ICD-10-CM | POA: Diagnosis present

## 2015-02-05 DIAGNOSIS — R42 Dizziness and giddiness: Secondary | ICD-10-CM | POA: Diagnosis not present

## 2015-02-05 DIAGNOSIS — R270 Ataxia, unspecified: Secondary | ICD-10-CM | POA: Diagnosis present

## 2015-02-05 DIAGNOSIS — I5042 Chronic combined systolic (congestive) and diastolic (congestive) heart failure: Secondary | ICD-10-CM | POA: Diagnosis not present

## 2015-02-05 DIAGNOSIS — K219 Gastro-esophageal reflux disease without esophagitis: Secondary | ICD-10-CM | POA: Diagnosis present

## 2015-02-05 DIAGNOSIS — R4781 Slurred speech: Secondary | ICD-10-CM | POA: Diagnosis present

## 2015-02-05 DIAGNOSIS — H409 Unspecified glaucoma: Secondary | ICD-10-CM | POA: Diagnosis present

## 2015-02-05 DIAGNOSIS — I614 Nontraumatic intracerebral hemorrhage in cerebellum: Secondary | ICD-10-CM | POA: Diagnosis present

## 2015-02-05 DIAGNOSIS — Z7982 Long term (current) use of aspirin: Secondary | ICD-10-CM

## 2015-02-05 DIAGNOSIS — R112 Nausea with vomiting, unspecified: Secondary | ICD-10-CM | POA: Diagnosis not present

## 2015-02-05 DIAGNOSIS — J449 Chronic obstructive pulmonary disease, unspecified: Secondary | ICD-10-CM | POA: Diagnosis not present

## 2015-02-05 DIAGNOSIS — E785 Hyperlipidemia, unspecified: Secondary | ICD-10-CM | POA: Diagnosis not present

## 2015-02-05 DIAGNOSIS — D649 Anemia, unspecified: Secondary | ICD-10-CM | POA: Diagnosis not present

## 2015-02-05 DIAGNOSIS — I480 Paroxysmal atrial fibrillation: Secondary | ICD-10-CM | POA: Diagnosis not present

## 2015-02-05 DIAGNOSIS — M1990 Unspecified osteoarthritis, unspecified site: Secondary | ICD-10-CM | POA: Diagnosis not present

## 2015-02-05 DIAGNOSIS — I4891 Unspecified atrial fibrillation: Secondary | ICD-10-CM | POA: Insufficient documentation

## 2015-02-05 DIAGNOSIS — R404 Transient alteration of awareness: Secondary | ICD-10-CM | POA: Diagnosis not present

## 2015-02-05 DIAGNOSIS — I10 Essential (primary) hypertension: Secondary | ICD-10-CM | POA: Diagnosis not present

## 2015-02-05 DIAGNOSIS — Z853 Personal history of malignant neoplasm of breast: Secondary | ICD-10-CM

## 2015-02-05 DIAGNOSIS — H5509 Other forms of nystagmus: Secondary | ICD-10-CM | POA: Diagnosis present

## 2015-02-05 DIAGNOSIS — G932 Benign intracranial hypertension: Secondary | ICD-10-CM | POA: Diagnosis present

## 2015-02-05 DIAGNOSIS — C679 Malignant neoplasm of bladder, unspecified: Secondary | ICD-10-CM | POA: Diagnosis not present

## 2015-02-05 DIAGNOSIS — D72829 Elevated white blood cell count, unspecified: Secondary | ICD-10-CM | POA: Diagnosis not present

## 2015-02-05 DIAGNOSIS — I618 Other nontraumatic intracerebral hemorrhage: Secondary | ICD-10-CM | POA: Diagnosis not present

## 2015-02-05 DIAGNOSIS — G911 Obstructive hydrocephalus: Secondary | ICD-10-CM | POA: Diagnosis not present

## 2015-02-05 DIAGNOSIS — Z8551 Personal history of malignant neoplasm of bladder: Secondary | ICD-10-CM | POA: Diagnosis not present

## 2015-02-05 DIAGNOSIS — I69293 Ataxia following other nontraumatic intracranial hemorrhage: Secondary | ICD-10-CM | POA: Diagnosis not present

## 2015-02-05 HISTORY — PX: CRANIECTOMY: SHX331

## 2015-02-05 HISTORY — DX: Nontraumatic intracerebral hemorrhage, unspecified: I61.9

## 2015-02-05 LAB — COMPREHENSIVE METABOLIC PANEL
ALBUMIN: 4.2 g/dL (ref 3.5–5.0)
ALT: 21 U/L (ref 14–54)
ANION GAP: 9 (ref 5–15)
AST: 25 U/L (ref 15–41)
Alkaline Phosphatase: 115 U/L (ref 38–126)
BUN: 16 mg/dL (ref 6–20)
CO2: 28 mmol/L (ref 22–32)
Calcium: 9 mg/dL (ref 8.9–10.3)
Chloride: 101 mmol/L (ref 101–111)
Creatinine, Ser: 0.63 mg/dL (ref 0.44–1.00)
GFR calc non Af Amer: >60 mL/min (ref >60)
GLUCOSE: 174 mg/dL — AB (ref 65–99)
POTASSIUM: 3.6 mmol/L (ref 3.5–5.1)
Sodium: 138 mmol/L (ref 135–145)
TOTAL PROTEIN: 7 g/dL (ref 6.5–8.1)
Total Bilirubin: 0.6 mg/dL (ref 0.3–1.2)

## 2015-02-05 LAB — CBC WITH DIFFERENTIAL/PLATELET
Basophils Absolute: 0 10*3/uL (ref 0.0–0.1)
Basophils Relative: 0 % (ref 0–1)
EOS ABS: 0.1 10*3/uL (ref 0.0–0.7)
Eosinophils Relative: 1 % (ref 0–5)
HEMATOCRIT: 44.9 % (ref 36.0–46.0)
Hemoglobin: 14.8 g/dL (ref 12.0–15.0)
Lymphocytes Relative: 16 % (ref 12–46)
Lymphs Abs: 2 10*3/uL (ref 0.7–4.0)
MCH: 31 pg (ref 26.0–34.0)
MCHC: 33 g/dL (ref 30.0–36.0)
MCV: 93.9 fL (ref 78.0–100.0)
Monocytes Absolute: 0.5 10*3/uL (ref 0.1–1.0)
Monocytes Relative: 4 % (ref 3–12)
NEUTROS PCT: 79 % — AB (ref 43–77)
Neutro Abs: 9.7 10*3/uL — ABNORMAL HIGH (ref 1.7–7.7)
Platelets: 213 10*3/uL (ref 150–400)
RBC: 4.78 MIL/uL (ref 3.87–5.11)
RDW: 13.5 % (ref 11.5–15.5)
WBC: 12.4 10*3/uL — ABNORMAL HIGH (ref 4.0–10.5)

## 2015-02-05 LAB — URINALYSIS, ROUTINE W REFLEX MICROSCOPIC
BILIRUBIN URINE: NEGATIVE
GLUCOSE, UA: NEGATIVE mg/dL
Hgb urine dipstick: NEGATIVE
KETONES UR: NEGATIVE mg/dL
Nitrite: NEGATIVE
Protein, ur: NEGATIVE mg/dL
SPECIFIC GRAVITY, URINE: 1.015 (ref 1.005–1.030)
Urobilinogen, UA: 0.2 mg/dL (ref 0.0–1.0)
pH: 6.5 (ref 5.0–8.0)

## 2015-02-05 LAB — APTT: aPTT: 30 seconds (ref 24–37)

## 2015-02-05 LAB — I-STAT TROPONIN, ED: Troponin i, poc: 0.01 ng/mL (ref 0.00–0.08)

## 2015-02-05 LAB — POCT I-STAT 4, (NA,K, GLUC, HGB,HCT)
Glucose, Bld: 163 mg/dL — ABNORMAL HIGH (ref 65–99)
HEMATOCRIT: 38 % (ref 36.0–46.0)
Hemoglobin: 12.9 g/dL (ref 12.0–15.0)
Potassium: 3.8 mmol/L (ref 3.5–5.1)
Sodium: 138 mmol/L (ref 135–145)

## 2015-02-05 LAB — CBG MONITORING, ED: GLUCOSE-CAPILLARY: 145 mg/dL — AB (ref 65–99)

## 2015-02-05 LAB — PROTIME-INR
INR: 1.06 (ref 0.00–1.49)
PROTHROMBIN TIME: 14 s (ref 11.6–15.2)

## 2015-02-05 LAB — URINE MICROSCOPIC-ADD ON

## 2015-02-05 LAB — TYPE AND SCREEN
ABO/RH(D): O POS
Antibody Screen: NEGATIVE

## 2015-02-05 LAB — MRSA PCR SCREENING: MRSA BY PCR: NEGATIVE

## 2015-02-05 LAB — ABO/RH: ABO/RH(D): O POS

## 2015-02-05 SURGERY — CRANIECTOMY POSTERIOR FOSSA DECOMPRESSION
Anesthesia: General | Site: Head

## 2015-02-05 MED ORDER — LEVALBUTEROL TARTRATE 45 MCG/ACT IN AERO: 1.0000 | INHALATION_SPRAY | RESPIRATORY_TRACT | Status: DC | PRN

## 2015-02-05 MED ORDER — ACETAMINOPHEN 325 MG PO TABS
650.0000 mg | ORAL_TABLET | ORAL | Status: DC | PRN
  Administered 2015-02-07 – 2015-02-12 (×7): 650 mg via ORAL
  Filled 2015-02-05 (×7): qty 2

## 2015-02-05 MED ORDER — LABETALOL HCL 5 MG/ML IV SOLN
10.0000 mg | INTRAVENOUS | Status: DC | PRN
  Administered 2015-02-06 – 2015-02-07 (×3): 20 mg via INTRAVENOUS
  Administered 2015-02-12: 10 mg via INTRAVENOUS
  Filled 2015-02-05 (×4): qty 4

## 2015-02-05 MED ORDER — BACITRACIN ZINC 500 UNIT/GM EX OINT
TOPICAL_OINTMENT | CUTANEOUS | Status: DC | PRN
  Administered 2015-02-05: 1 via TOPICAL

## 2015-02-05 MED ORDER — PANTOPRAZOLE SODIUM 40 MG IV SOLR
40.0000 mg | Freq: Every day | INTRAVENOUS | Status: DC
  Administered 2015-02-05: 40 mg via INTRAVENOUS
  Filled 2015-02-05 (×2): qty 40

## 2015-02-05 MED ORDER — GLYCOPYRROLATE 0.2 MG/ML IJ SOLN
INTRAMUSCULAR | Status: AC
  Filled 2015-02-05: qty 4

## 2015-02-05 MED ORDER — SUFENTANIL CITRATE 50 MCG/ML IV SOLN
INTRAVENOUS | Status: AC
  Filled 2015-02-05: qty 1

## 2015-02-05 MED ORDER — ACETAMINOPHEN 650 MG RE SUPP: 650.0000 mg | RECTAL | Status: DC | PRN

## 2015-02-05 MED ORDER — LIDOCAINE-EPINEPHRINE 1 %-1:100000 IJ SOLN
INTRAMUSCULAR | Status: DC | PRN
  Administered 2015-02-05: 4 mL

## 2015-02-05 MED ORDER — BISACODYL 10 MG RE SUPP: 10.0000 mg | Freq: Every day | RECTAL | Status: DC | PRN

## 2015-02-05 MED ORDER — STROKE: EARLY STAGES OF RECOVERY BOOK
Freq: Once | Status: AC
  Administered 2015-02-05: 1
  Filled 2015-02-05: qty 1

## 2015-02-05 MED ORDER — SUFENTANIL CITRATE 50 MCG/ML IV SOLN
INTRAVENOUS | Status: DC | PRN
  Administered 2015-02-05 (×2): 10 ug via INTRAVENOUS

## 2015-02-05 MED ORDER — SODIUM CHLORIDE 0.9 % IV SOLN
INTRAVENOUS | Status: DC | PRN
  Administered 2015-02-05: 13:00:00 via INTRAVENOUS

## 2015-02-05 MED ORDER — PROMETHAZINE HCL 25 MG/ML IJ SOLN: 6.2500 mg | INTRAMUSCULAR | Status: DC | PRN

## 2015-02-05 MED ORDER — VECURONIUM BROMIDE 10 MG IV SOLR
INTRAVENOUS | Status: DC | PRN
  Administered 2015-02-05 (×2): 2 mg via INTRAVENOUS

## 2015-02-05 MED ORDER — NEOSTIGMINE METHYLSULFATE 10 MG/10ML IV SOLN
INTRAVENOUS | Status: AC
  Filled 2015-02-05: qty 1

## 2015-02-05 MED ORDER — ESMOLOL HCL 10 MG/ML IV SOLN
INTRAVENOUS | Status: DC | PRN
  Administered 2015-02-05 (×3): 30 mg via INTRAVENOUS

## 2015-02-05 MED ORDER — LABETALOL HCL 5 MG/ML IV SOLN
10.0000 mg | INTRAVENOUS | Status: DC | PRN
  Filled 2015-02-05: qty 8

## 2015-02-05 MED ORDER — ROCURONIUM BROMIDE 100 MG/10ML IV SOLN
INTRAVENOUS | Status: DC | PRN
  Administered 2015-02-05: 50 mg via INTRAVENOUS

## 2015-02-05 MED ORDER — MORPHINE SULFATE 2 MG/ML IJ SOLN: 1.0000 mg | INTRAMUSCULAR | Status: DC | PRN

## 2015-02-05 MED ORDER — BACITRACIN 50000 UNITS IM SOLR
INTRAMUSCULAR | Status: DC | PRN
  Administered 2015-02-05: 14:00:00

## 2015-02-05 MED ORDER — BUPIVACAINE HCL 0.5 % IJ SOLN
INTRAMUSCULAR | Status: DC | PRN
  Administered 2015-02-05: 4 mL

## 2015-02-05 MED ORDER — MANNITOL 20 % IV SOLN
INTRAVENOUS | Status: DC | PRN
  Administered 2015-02-05: 14:00:00 via INTRAVENOUS

## 2015-02-05 MED ORDER — SENNOSIDES-DOCUSATE SODIUM 8.6-50 MG PO TABS
1.0000 | ORAL_TABLET | Freq: Two times a day (BID) | ORAL | Status: DC
  Administered 2015-02-07 – 2015-02-11 (×4): 1 via ORAL
  Filled 2015-02-05 (×19): qty 1

## 2015-02-05 MED ORDER — SODIUM CHLORIDE 0.9 % IV SOLN: INTRAVENOUS | Status: DC

## 2015-02-05 MED ORDER — THROMBIN 5000 UNITS EX SOLR
CUTANEOUS | Status: DC | PRN
  Administered 2015-02-05 (×2): via TOPICAL

## 2015-02-05 MED ORDER — NEOSTIGMINE METHYLSULFATE 10 MG/10ML IV SOLN
INTRAVENOUS | Status: DC | PRN
  Administered 2015-02-05: 4 mg via INTRAVENOUS
  Administered 2015-02-05: 1 mg via INTRAVENOUS

## 2015-02-05 MED ORDER — 0.9 % SODIUM CHLORIDE (POUR BTL) OPTIME
TOPICAL | Status: DC | PRN
  Administered 2015-02-05 (×3): 1000 mL

## 2015-02-05 MED ORDER — THROMBIN 20000 UNITS EX SOLR
CUTANEOUS | Status: DC | PRN
  Administered 2015-02-05 (×2): via TOPICAL

## 2015-02-05 MED ORDER — PROPOFOL 10 MG/ML IV BOLUS
INTRAVENOUS | Status: DC | PRN
  Administered 2015-02-05: 60 mg via INTRAVENOUS
  Administered 2015-02-05: 140 mg via INTRAVENOUS

## 2015-02-05 MED ORDER — SUCCINYLCHOLINE CHLORIDE 20 MG/ML IJ SOLN
INTRAMUSCULAR | Status: DC | PRN
  Administered 2015-02-05: 100 mg via INTRAVENOUS

## 2015-02-05 MED ORDER — HYDROMORPHONE HCL 1 MG/ML IJ SOLN: 0.2500 mg | INTRAMUSCULAR | Status: DC | PRN

## 2015-02-05 MED ORDER — SODIUM CHLORIDE 0.9 % IJ SOLN
INTRAMUSCULAR | Status: AC
  Filled 2015-02-05: qty 20

## 2015-02-05 MED ORDER — GLYCOPYRROLATE 0.2 MG/ML IJ SOLN
INTRAMUSCULAR | Status: DC | PRN
  Administered 2015-02-05 (×2): 0.2 mg via INTRAVENOUS
  Administered 2015-02-05: .8 mg via INTRAVENOUS

## 2015-02-05 MED ORDER — LATANOPROST 0.005 % OP SOLN
1.0000 [drp] | Freq: Every day | OPHTHALMIC | Status: DC
  Administered 2015-02-05 – 2015-02-13 (×9): 1 [drp] via OPHTHALMIC
  Filled 2015-02-05 (×2): qty 2.5

## 2015-02-05 MED ORDER — PHENYLEPHRINE HCL 10 MG/ML IJ SOLN
INTRAMUSCULAR | Status: AC
  Filled 2015-02-05: qty 1

## 2015-02-05 MED ORDER — PANTOPRAZOLE SODIUM 40 MG PO TBEC
40.0000 mg | DELAYED_RELEASE_TABLET | Freq: Every day | ORAL | Status: DC
  Administered 2015-02-06 – 2015-02-14 (×9): 40 mg via ORAL
  Filled 2015-02-05 (×9): qty 1

## 2015-02-05 MED ORDER — ONDANSETRON HCL 4 MG/2ML IJ SOLN
4.0000 mg | INTRAMUSCULAR | Status: DC | PRN
  Administered 2015-02-05 – 2015-02-06 (×2): 4 mg via INTRAVENOUS
  Filled 2015-02-05 (×2): qty 2

## 2015-02-05 MED ORDER — CEFAZOLIN SODIUM-DEXTROSE 2-3 GM-% IV SOLR
INTRAVENOUS | Status: DC | PRN
  Administered 2015-02-05: 2 g via INTRAVENOUS

## 2015-02-05 MED ORDER — ONDANSETRON HCL 4 MG/2ML IJ SOLN
4.0000 mg | Freq: Once | INTRAMUSCULAR | Status: AC
  Administered 2015-02-05: 4 mg via INTRAVENOUS
  Filled 2015-02-05: qty 2

## 2015-02-05 MED ORDER — LEVALBUTEROL HCL 0.63 MG/3ML IN NEBU
0.6300 mg | INHALATION_SOLUTION | RESPIRATORY_TRACT | Status: DC | PRN
Start: 2015-02-05 — End: 2015-02-14

## 2015-02-05 MED ORDER — VECURONIUM BROMIDE 10 MG IV SOLR
INTRAVENOUS | Status: AC
  Filled 2015-02-05: qty 10

## 2015-02-05 MED ORDER — SODIUM CHLORIDE 0.9 % IV SOLN
INTRAVENOUS | Status: DC
  Administered 2015-02-05: 12:00:00 via INTRAVENOUS

## 2015-02-05 MED ORDER — CEFAZOLIN SODIUM 1-5 GM-% IV SOLN
1.0000 g | Freq: Three times a day (TID) | INTRAVENOUS | Status: AC
  Administered 2015-02-05 – 2015-02-06 (×2): 1 g via INTRAVENOUS
  Filled 2015-02-05 (×2): qty 50

## 2015-02-05 MED ORDER — PANTOPRAZOLE SODIUM 40 MG IV SOLR: 40.0000 mg | Freq: Every day | INTRAVENOUS | Status: DC

## 2015-02-05 MED ORDER — ONDANSETRON HCL 4 MG/2ML IJ SOLN
INTRAMUSCULAR | Status: DC | PRN
  Administered 2015-02-05: 4 mg via INTRAVENOUS

## 2015-02-05 MED ORDER — PHENYLEPHRINE HCL 10 MG/ML IJ SOLN
10.0000 mg | INTRAVENOUS | Status: DC | PRN
  Administered 2015-02-05: 40 ug/min via INTRAVENOUS

## 2015-02-05 MED ORDER — GLYCOPYRROLATE 0.2 MG/ML IJ SOLN
INTRAMUSCULAR | Status: AC
  Filled 2015-02-05: qty 1

## 2015-02-05 MED ORDER — ONDANSETRON HCL 4 MG PO TABS: 4.0000 mg | ORAL_TABLET | ORAL | Status: DC | PRN

## 2015-02-05 MED ORDER — PHENYLEPHRINE HCL 10 MG/ML IJ SOLN
INTRAMUSCULAR | Status: DC | PRN
  Administered 2015-02-05 (×2): 80 ug via INTRAVENOUS
  Administered 2015-02-05: 40 ug via INTRAVENOUS
  Administered 2015-02-05: 80 ug via INTRAVENOUS

## 2015-02-05 MED ORDER — HEPARIN SODIUM (PORCINE) 5000 UNIT/ML IJ SOLN
5000.0000 [IU] | Freq: Three times a day (TID) | INTRAMUSCULAR | Status: DC
  Administered 2015-02-06 – 2015-02-14 (×26): 5000 [IU] via SUBCUTANEOUS
  Filled 2015-02-05 (×30): qty 1

## 2015-02-05 MED ORDER — DOCUSATE SODIUM 100 MG PO CAPS
100.0000 mg | ORAL_CAPSULE | Freq: Two times a day (BID) | ORAL | Status: DC
  Administered 2015-02-06 – 2015-02-11 (×7): 100 mg via ORAL
  Filled 2015-02-05 (×19): qty 1

## 2015-02-05 MED ORDER — LABETALOL HCL 5 MG/ML IV SOLN
INTRAVENOUS | Status: DC | PRN
  Administered 2015-02-05: 5 mg via INTRAVENOUS

## 2015-02-05 MED ORDER — LABETALOL HCL 5 MG/ML IV SOLN
INTRAVENOUS | Status: AC
  Filled 2015-02-05: qty 4

## 2015-02-05 MED ORDER — EPHEDRINE SULFATE 50 MG/ML IJ SOLN
INTRAMUSCULAR | Status: DC | PRN
  Administered 2015-02-05 (×4): 10 mg via INTRAVENOUS

## 2015-02-05 MED ORDER — PROMETHAZINE HCL 25 MG PO TABS: 12.5000 mg | ORAL_TABLET | ORAL | Status: DC | PRN

## 2015-02-05 MED ORDER — CEFAZOLIN SODIUM-DEXTROSE 2-3 GM-% IV SOLR
INTRAVENOUS | Status: AC
  Filled 2015-02-05: qty 50

## 2015-02-05 MED ORDER — PROPOFOL 10 MG/ML IV BOLUS
INTRAVENOUS | Status: AC
  Filled 2015-02-05: qty 20

## 2015-02-05 MED ORDER — SENNA 8.6 MG PO TABS
1.0000 | ORAL_TABLET | Freq: Two times a day (BID) | ORAL | Status: DC
  Administered 2015-02-07 – 2015-02-11 (×5): 8.6 mg via ORAL
  Filled 2015-02-05 (×19): qty 1

## 2015-02-05 MED ORDER — SODIUM CHLORIDE 0.9 % IV SOLN
INTRAVENOUS | Status: DC
  Administered 2015-02-05: 20:00:00 via INTRAVENOUS
  Administered 2015-02-06: 75 mL/h via INTRAVENOUS
  Administered 2015-02-07: 06:00:00 via INTRAVENOUS

## 2015-02-05 MED ORDER — LIDOCAINE HCL (CARDIAC) 20 MG/ML IV SOLN
INTRAVENOUS | Status: DC | PRN
  Administered 2015-02-05: 100 mg via INTRAVENOUS

## 2015-02-05 MED ORDER — CETYLPYRIDINIUM CHLORIDE 0.05 % MT LIQD
7.0000 mL | Freq: Two times a day (BID) | OROMUCOSAL | Status: DC
  Administered 2015-02-06 – 2015-02-13 (×13): 7 mL via OROMUCOSAL

## 2015-02-05 SURGICAL SUPPLY — 83 items
ADH SKN CLS APL DERMABOND .7 (GAUZE/BANDAGES/DRESSINGS) ×1
BLADE CLIPPER SURG (BLADE) ×2 IMPLANT
BLADE SURG 15 STRL LF DISP TIS (BLADE) IMPLANT
BLADE SURG 15 STRL SS (BLADE)
BLADE ULTRA TIP 2M (BLADE) IMPLANT
BRUSH SCRUB EZ 1% IODOPHOR (MISCELLANEOUS) IMPLANT
BUR ACORN 6.0 PRECISION (BURR) ×2 IMPLANT
BUR ACORN 6.0MM PRECISION (BURR) ×1
CANISTER SUCT 3000ML PPV (MISCELLANEOUS) ×3 IMPLANT
CATH VENTRIC 35X38 W/TROCAR LG (CATHETERS) IMPLANT
CLIP TI MEDIUM 6 (CLIP) IMPLANT
CONT SPEC 4OZ CLIKSEAL STRL BL (MISCELLANEOUS) ×3 IMPLANT
CORDS BIPOLAR (ELECTRODE) ×1 IMPLANT
COVER BACK TABLE 60X90IN (DRAPES) IMPLANT
DECANTER SPIKE VIAL GLASS SM (MISCELLANEOUS) ×1 IMPLANT
DERMABOND ADVANCED (GAUZE/BANDAGES/DRESSINGS) ×2
DERMABOND ADVANCED .7 DNX12 (GAUZE/BANDAGES/DRESSINGS) IMPLANT
DRAIN SNY WOU 7FLT (WOUND CARE) IMPLANT
DRAPE LAPAROTOMY 100X72 PEDS (DRAPES) ×3 IMPLANT
DRAPE MICROSCOPE LEICA (MISCELLANEOUS) ×3 IMPLANT
DRAPE NEUROLOGICAL W/INCISE (DRAPES) ×2 IMPLANT
DRAPE WARM FLUID 44X44 (DRAPE) ×3 IMPLANT
DRSG OPSITE 4X5.5 SM (GAUZE/BANDAGES/DRESSINGS) IMPLANT
DRSG TELFA 3X8 NADH (GAUZE/BANDAGES/DRESSINGS) IMPLANT
DURAPREP 26ML APPLICATOR (WOUND CARE) ×3 IMPLANT
ELECT CAUTERY BLADE 6.4 (BLADE) ×3 IMPLANT
ELECT REM PT RETURN 9FT ADLT (ELECTROSURGICAL) ×3
ELECTRODE REM PT RTRN 9FT ADLT (ELECTROSURGICAL) ×1 IMPLANT
EVACUATOR 1/8 PVC DRAIN (DRAIN) IMPLANT
EVACUATOR SILICONE 100CC (DRAIN) IMPLANT
GAUZE SPONGE 4X4 12PLY STRL (GAUZE/BANDAGES/DRESSINGS) IMPLANT
GAUZE SPONGE 4X4 16PLY XRAY LF (GAUZE/BANDAGES/DRESSINGS) IMPLANT
GLOVE BIO SURGEON STRL SZ7.5 (GLOVE) ×2 IMPLANT
GLOVE BIOGEL PI IND STRL 7.5 (GLOVE) ×1 IMPLANT
GLOVE BIOGEL PI INDICATOR 7.5 (GLOVE) ×6
GLOVE ECLIPSE 7.0 STRL STRAW (GLOVE) ×6 IMPLANT
GLOVE ECLIPSE 7.5 STRL STRAW (GLOVE) ×6 IMPLANT
GOWN STRL REUS W/ TWL LRG LVL3 (GOWN DISPOSABLE) ×2 IMPLANT
GOWN STRL REUS W/ TWL XL LVL3 (GOWN DISPOSABLE) IMPLANT
GOWN STRL REUS W/TWL 2XL LVL3 (GOWN DISPOSABLE) ×2 IMPLANT
GOWN STRL REUS W/TWL LRG LVL3 (GOWN DISPOSABLE) ×3
GOWN STRL REUS W/TWL XL LVL3 (GOWN DISPOSABLE)
GRAFT DURAGEN MATRIX 1WX1L (Tissue) ×2 IMPLANT
GRAFT DURAGEN MATRIX 2WX2L ×2 IMPLANT
HEMOSTAT POWDER SURGIFOAM 1G (HEMOSTASIS) ×4 IMPLANT
HEMOSTAT SURGICEL 2X14 (HEMOSTASIS) ×1 IMPLANT
KIT BASIN OR (CUSTOM PROCEDURE TRAY) ×3 IMPLANT
KIT DRAIN CSF ACCUDRAIN (MISCELLANEOUS) IMPLANT
KIT ROOM TURNOVER OR (KITS) ×3 IMPLANT
MARKER SKIN DUAL TIP RULER LAB (MISCELLANEOUS) IMPLANT
NDL HYPO 25X1 1.5 SAFETY (NEEDLE) ×1 IMPLANT
NDL SPNL 18GX3.5 QUINCKE PK (NEEDLE) IMPLANT
NEEDLE HYPO 25X1 1.5 SAFETY (NEEDLE) ×3 IMPLANT
NEEDLE SPNL 18GX3.5 QUINCKE PK (NEEDLE) IMPLANT
NS IRRIG 1000ML POUR BTL (IV SOLUTION) ×3 IMPLANT
PACK CRANIOTOMY (CUSTOM PROCEDURE TRAY) ×3 IMPLANT
PAD ARMBOARD 7.5X6 YLW CONV (MISCELLANEOUS) ×9 IMPLANT
PAD DRESSING TELFA 3X8 NADH (GAUZE/BANDAGES/DRESSINGS) ×1 IMPLANT
PATTIES SURGICAL .5 X.5 (GAUZE/BANDAGES/DRESSINGS) IMPLANT
PATTIES SURGICAL .5 X3 (DISPOSABLE) IMPLANT
PATTIES SURGICAL 1/4 X 3 (GAUZE/BANDAGES/DRESSINGS) IMPLANT
PATTIES SURGICAL 1X1 (DISPOSABLE) IMPLANT
PIN MAYFIELD SKULL DISP (PIN) IMPLANT
PLATE 1.5  2HOLE LNG NEURO (Plate) ×8 IMPLANT
PLATE 1.5 2HOLE LNG NEURO (Plate) IMPLANT
RUBBERBAND STERILE (MISCELLANEOUS) ×4 IMPLANT
SCREW SELF DRILL HT 1.5/4MM (Screw) ×16 IMPLANT
SPONGE NEURO XRAY DETECT 1X3 (DISPOSABLE) IMPLANT
SPONGE SURGIFOAM ABS GEL 100 (HEMOSTASIS) ×4 IMPLANT
STAPLER SKIN PROX WIDE 3.9 (STAPLE) ×3 IMPLANT
SUT ETHILON 3 0 FSL (SUTURE) IMPLANT
SUT NURALON 4 0 TR CR/8 (SUTURE) ×6 IMPLANT
SUT PROLENE 6 0 BV (SUTURE) IMPLANT
SUT VIC AB 2-0 CT1 18 (SUTURE) ×7 IMPLANT
SUT VIC AB 3-0 SH 8-18 (SUTURE) ×3 IMPLANT
SUT VICRYL 3-0 RB1 18 ABS (SUTURE) ×2 IMPLANT
SYR 20ML ECCENTRIC (SYRINGE) ×3 IMPLANT
SYR CONTROL 10ML LL (SYRINGE) ×3 IMPLANT
TOWEL OR 17X24 6PK STRL BLUE (TOWEL DISPOSABLE) ×2 IMPLANT
TOWEL OR 17X26 10 PK STRL BLUE (TOWEL DISPOSABLE) ×3 IMPLANT
TRAY FOLEY W/METER SILVER 14FR (SET/KITS/TRAYS/PACK) IMPLANT
UNDERPAD 30X30 INCONTINENT (UNDERPADS AND DIAPERS) IMPLANT
WATER STERILE IRR 1000ML POUR (IV SOLUTION) ×3 IMPLANT

## 2015-02-05 NOTE — Anesthesia Postprocedure Evaluation (Signed)
  Anesthesia Post-op Note  Patient: [name]  Procedure(s) Performed: Procedure(s): SUBOCCIPITAL CRANIECTOMY EVACUATION OF HEMATOMA (N/A)  Patient Location: PACU  Anesthesia Type:General  Level of Consciousness: awake and alert   Airway and Oxygen Therapy: Patient Spontanous Breathing  Post-op Pain: mild  Post-op Assessment: Post-op Vital signs reviewed  Post-op Vital Signs: Reviewed  Last Vitals:  Filed Vitals:   02/05/15 2230  BP: 141/67  Pulse: 71  Temp:   Resp: 16    Complications: No apparent anesthesia complications

## 2015-02-05 NOTE — Op Note (Signed)
PREOP DIAGNOSIS:  1. Right cerebellar hematoma 2. Intracranial hypertension   POSTOP DIAGNOSIS: Same  PROCEDURE: 1. Right retrosigmoid suboccipital craniotomy, evacuation of hematoma 2. Use of intraoperative microscope  SURGEON: Dr. Consuella Lose, MD  ASSISTANT: None  ANESTHESIA: General Endotracheal  EBL: 200cc  SPECIMENS: None  DRAINS: None  COMPLICATIONS: None immediate  CONDITION: Hemodynamically stable to PACU  HISTORY: [name] is a 80 y.o. female presenting to the emergency department with sudden onset of dizziness and nausea this morning. She was found to have horizontal nystagmus on physical exam. CT scan demonstrated a large right cerebellar hemispheric hematoma. With this finding, surgical evacuation was indicated. The risks and benefits of the surgery were explained in detail to the patient and her husband. After all questions were answered, informed consent was obtained.  PROCEDURE IN DETAIL: After informed consent was obtained and witnessed, the patient was brought to the operating room. After induction of general anesthesia, Mayfield head holder was applied to the patient, and the patient was positioned on the operative table in the left lateral decubitus position. All pressure points were meticulously padded. Curvilinear retro-auricular skin incision was then marked out and prepped and draped in the usual sterile fashion.  After timeout was conducted, skin incision was infiltrated with local anesthesia with epinephrine. Skin incision was then made sharply and Bovie electrocautery was used to dissect the subcutaneous tissue until the fascia was identified and incised. The lateral suboccipital musculature was then undermined in layers and divided. Subperiosteal dissection along the posterior aspect of the mastoid was then carried out. Several retaining retractors were then placed.  Trudee Kuster hole was then created just underneath the transverse sinus, and  craniotome was used to fashion a retrosigmoid craniotomy. The transverse sinus was identified at the superior aspect of the craniotomy area and hemostasis was achieved on the epidural surface. Dura was then opened in a Y-shaped fashion based superiorly and anteriorly. A small amount of sinus bleeding was encountered at the posterior superior aspect of the durotomy which was controlled with a single 4-0 Nurolon stitch. At this point the microscope was draped sterilely and brought into the field, and the remainder of the case was done under the microscope.  Cerebellar cortex was coagulated, and utilizing bipolar electrocautery and suction, corticectomy was made. I dissected through the cerebellar white matter for approximately 1 cm and identified the cerebellar hematoma. This was easily removed with suction. Hematoma evacuation continued until normal cerebellar white matter was encountered on all margins. At this point the cavity was irrigated with normal saline. He was stasis was achieved using a combination of bipolar electrocautery and morcellized Gelfoam with thrombin.  I was unable to reapproximate the dura because of retraction, and partial-thickness tear during the craniotomy. I therefore placed a sheet of DuraGen as an inlay graft, as well as another piece of DuraGen as an onlay graft. Bone flap was then replaced and plated with standard titanium plates and screws. The wound was again irrigated with normal saline irrigation. The wound was then closed in multiple layers using a combination of interrupted 0, 2-0, and 3-0 Vicryl stitches. A layer of Dermabond was then applied. The Mayfield head holder was then removed. The patient was then transferred to the stretcher, extubated, and taken to the postanesthesia care unit in stable hemodynamic condition.  At the end of the case all sponge, needle, instrument, and cottonoid counts were correct.

## 2015-02-05 NOTE — ED Notes (Addendum)
Pt presents via [facility] with c/o Dizziness and N/V.  Pt reports went to bed last night feeling normal and awoke at 0545 feeling normal and began feeling extremely dizzy and nauseous at 0630.  Pt vomited at home multiple times and her husband called EMS.  Pt is A&Ox4 but is keeping her eyes closed because it helps her dizziness. On exam, patient has marked nystagmus at rest.

## 2015-02-05 NOTE — ED Notes (Signed)
Neuro OR called and states they are ready for pt. Neuro surgeon still in room.

## 2015-02-05 NOTE — ED Notes (Signed)
This RN transporting patient to Neuro OR.  Nightgown and shoes given to husband.

## 2015-02-05 NOTE — Transfer of Care (Signed)
Immediate Anesthesia Transfer of Care Note  Patient: [name]  Procedure(s) Performed: Procedure(s): SUBOCCIPITAL CRANIECTOMY EVACUATION OF HEMATOMA (N/A)  Patient Location: PACU  Anesthesia Type:General  Level of Consciousness: sedated  Airway & Oxygen Therapy: Patient Spontanous Breathing and Patient connected to nasal cannula oxygen  Post-op Assessment: Report given to RN and Post -op Vital signs reviewed and stable  Post vital signs: Reviewed and stable  Last Vitals:  Filed Vitals:   02/05/15 1615  BP: 130/63  Pulse: 67  Temp: 35.7 C  Resp: 17    Complications: No apparent anesthesia complications

## 2015-02-05 NOTE — ED Notes (Signed)
Xray at bedside

## 2015-02-05 NOTE — ED Notes (Signed)
Phlebotomy at bedside.

## 2015-02-05 NOTE — ED Notes (Signed)
MRI Brain Canceled Per Dr. Rochele Pages.

## 2015-02-05 NOTE — H&P (Signed)
Admission H&P    Chief Complaint: Acute onset of ataxia, dizziness and nausea as well as blurred vision.  HPI: [name] is an 80 y.o. female with a history of congestive heart failure, COPD, breast cancer, transient atrial fibrillation and hypertension, presenting with acute onset of ataxia with vertigo, nausea and blurred vision at 6 AM this morning. She has been on aspirin 81 mg most days. He has not been on anticoagulation. CT scan of her head showed a large right hemispheric cerebellar hematoma measuring 4.0 x 3.6 mm. There was rounding edema with significant mass effect on the fourth ventricle. There were no signs of hydrocephalus. NIH stroke score was 4.  LSN: 6 AM on 02/05/2015 tPA Given: No: Acute cerebellar hemorrhage mRankin:  Past Medical History  Diagnosis Date  . History of CHF (congestive heart failure)     POST SURG 2010  . COPD (chronic obstructive pulmonary disease)   . History of breast cancer     DX DCIS IN 2004--  S/P RIGHT MASTECTOMY , NO CHEMORADIATION---  NO RECURRENCE  . Glaucoma of both eyes   . History of atrial fibrillation without current medication 2010    POST SURG 2010--  PER PT NO ISSUES SINCE  . Arthritis   . Dyspnea on exertion   . GERD (gastroesophageal reflux disease)   . Nephrolithiasis   . History of shingles     02/ 2015--  back of neck and left flank--  no residual pain  . History of hypertension   . Recurrent bladder papillary carcinoma first dx 06/ 2014    s/p  turbt's  and instillation mitomycin c (chemo)    Past Surgical History  Procedure Laterality Date  . Total knee arthroplasty Right 03-22-2009  . Partial mastectomy with needle localization Right 01-14-2003    DCIS  . Total mastectomy Right 02-03-2003    W/ SLN BX  AND  POST 03-01-2003 EVACUATION HEMATOMA  . Removal cyst left hand  2013  . Transthoracic echocardiogram  03-25-2009    MILD LVF/ EF 70-80%/ MILD INCREASE SYSTOLIC PULMONARY  PRESSURE  . Transurethral  resection of bladder tumor with gyrus (turbt-gyrus) N/A 03/26/2013    Procedure: TRANSURETHRAL RESECTION OF BLADDER TUMOR WITH GYRUS (TURBT-GYRUS);  Surgeon: Alexis Frock, MD;  Location: [facility];  Service: Urology;  Laterality: N/A;  . Cystoscopy w/ ureteral stent placement Bilateral 03/26/2013    Procedure: CYSTOSCOPY WITH BILATERAL RETROGRADE PYELOGRAM/URETERAL STENT PLACEMENT;  Surgeon: Alexis Frock, MD;  Location: [facility];  Service: Urology;  Laterality: Bilateral;  . Transurethral resection of bladder tumor with gyrus (turbt-gyrus) N/A 05/13/2013    Procedure: TRANSURETHRAL RESECTION OF BLADDER TUMOR WITH GYRUS (TURBT-GYRUS)  RE-STAGING TRANSURETHRAL RESECTION OF BLADDER TUMOR, LEFT RETROGRADE PYELOGRAM AND STENT EXCHANGE;  Surgeon: Alexis Frock, MD;  Location: [facility];  Service: Urology;  Laterality: N/A;  . Cystoscopy w/ ureteral stent placement Left 05/13/2013    Procedure: CYSTOSCOPY WITH RETROGRADE PYELOGRAM/URETERAL STENT PLACEMENT STENT EXCHANGE;  Surgeon: Alexis Frock, MD;  Location: [facility];  Service: Urology;  Laterality: Left;  . Tubal ligation    . Cataract extraction w/ intraocular lens  implant, bilateral    . Transurethral resection of bladder tumor with gyrus (turbt-gyrus) N/A 08/11/2014    Procedure: TRANSURETHRAL RESECTION OF BLADDER TUMOR WITH GYRUS (TURBT-GYRUS);  Surgeon: Alexis Frock, MD;  Location: [facility];  Service: Urology;  Laterality: N/A;  . Cystoscopy w/ retrogrades Bilateral 08/11/2014    Procedure:  CYSTOSCOPY WITH BILATERAL RETROGRADE PYELOGRAM AND MITOMYCIN INSTILLATION;  Surgeon: Alexis Frock, MD;  Location: [facility];  Service: Urology;  Laterality: Bilateral;    Family History  Problem Relation Age of Onset  . Colon cancer Mother 19  . Colon cancer Maternal Grandmother   . Breast cancer Maternal Aunt 28  . Cancer Maternal Aunt     ?  ovarian  . Breast cancer Other 60    breast cancer, ovarian cancer  . Breast cancer Daughter 3   Social History:  reports that she quit smoking about 11 years ago. Her smoking use included Cigarettes. She has a 40 pack-year smoking history. She has never used smokeless tobacco. She reports that she does not drink alcohol or use illicit drugs.  Allergies:  Allergies  Allergen Reactions  . Morphine And Related Nausea And Vomiting   Medications: Patient's preadmission medications were reviewed by me.  ROS: History obtained from spouse and the patient  General ROS: negative for - chills, fatigue, fever, night sweats, weight gain or weight loss Psychological ROS: negative for - behavioral disorder, hallucinations, memory difficulties, mood swings or suicidal ideation Ophthalmic ROS: negative for - blurry vision, double vision, eye pain or loss of vision ENT ROS: negative for - epistaxis, nasal discharge, oral lesions, sore throat, tinnitus or vertigo Allergy and Immunology ROS: negative for - hives or itchy/watery eyes Hematological and Lymphatic ROS: negative for - bleeding problems, bruising or swollen lymph nodes Endocrine ROS: negative for - galactorrhea, hair pattern changes, polydipsia/polyuria or temperature intolerance Respiratory ROS: negative for - cough, hemoptysis, shortness of breath or wheezing Cardiovascular ROS: negative for - chest pain, dyspnea on exertion, edema or irregular heartbeat Gastrointestinal ROS: negative for - abdominal pain, diarrhea, hematemesis, nausea/vomiting or stool incontinence Genito-Urinary ROS: negative for - dysuria, hematuria, incontinence or urinary frequency/urgency Musculoskeletal ROS: negative for - joint swelling or muscular weakness Neurological ROS: as noted in HPI Dermatological ROS: negative for rash and skin lesion changes  Physical Examination: Blood pressure 170/78, pulse 63, temperature 92.1 F (33.4 C), temperature source Rectal,  resp. rate 17, height _0  (1.676 m), weight 76.431 kg (168 lb 8 oz), last menstrual period 12/03/2002, SpO2 100 %.  HEENT-  Normocephalic, no lesions, without obvious abnormality.  Normal external eye and conjunctiva.  Normal TM's bilaterally.  Normal auditory canals and external ears. Normal external nose, mucus membranes and septum.  Normal pharynx. Neck supple with no masses, nodes, nodules or enlargement. Cardiovascular - regular rate and rhythm, S1, S2 normal, no murmur, click, rub or gallop Lungs - chest clear, no wheezing, rales, normal symmetric air entry Abdomen - soft, non-tender; bowel sounds normal; no masses,  no organomegaly Extremities - no joint deformities, effusion, or inflammation, no edema and no skin discoloration  Neurologic Examination: Mental Status: Lethargic, oriented, thought content appropriate.  Speech moderately slurred without evidence of aphasia. Able to follow commands without difficulty. Cranial Nerves: II-Visual fields were normal. III/IV/VI-Pupils were equal and reacted. Extraocular movements were full and conjugate.    V/VII-no facial numbness; face was asymmetric but there was no no facial weakness with testing. VIII-normal. X-moderate dysarthria. XI: trapezius strength/neck flexion strength normal bilaterally XII-midline tongue extension with normal strength. Motor: 5/5 bilaterally with normal tone and bulk Sensory: Normal throughout. Deep Tendon Reflexes: 1+ and symmetric. Plantars: Flexor bilaterally Cerebellar: Moderately impaired finger to nose and heel to shin testing on the right. Carotid auscultation: Normal  Results for orders placed or performed during the hospital encounter of 02/05/15 (  from the past 48 hour(s))  I-Stat Troponin, ED (not at [facility])     Status: None   Collection Time: 02/05/15 10:15 AM  Result Value Ref Range   Troponin i, poc 0.01 0.00 - 0.08 ng/mL   Comment 3            Comment: Due to the release kinetics of cTnI, a  negative result within the first hours of the onset of symptoms does not rule out myocardial infarction with certainty. If myocardial infarction is still suspected, repeat the test at appropriate intervals.   CBC with Differential     Status: Abnormal   Collection Time: 02/05/15 10:16 AM  Result Value Ref Range   WBC 12.4 (H) 4.0 - 10.5 K/uL   RBC 4.78 3.87 - 5.11 MIL/uL   Hemoglobin 14.8 12.0 - 15.0 g/dL   HCT 44.9 36.0 - 46.0 %   MCV 93.9 78.0 - 100.0 fL   MCH 31.0 26.0 - 34.0 pg   MCHC 33.0 30.0 - 36.0 g/dL   RDW 13.5 11.5 - 15.5 %   Platelets 213 150 - 400 K/uL   Neutrophils Relative % 79 (H) 43 - 77 %   Neutro Abs 9.7 (H) 1.7 - 7.7 K/uL   Lymphocytes Relative 16 12 - 46 %   Lymphs Abs 2.0 0.7 - 4.0 K/uL   Monocytes Relative 4 3 - 12 %   Monocytes Absolute 0.5 0.1 - 1.0 K/uL   Eosinophils Relative 1 0 - 5 %   Eosinophils Absolute 0.1 0.0 - 0.7 K/uL   Basophils Relative 0 0 - 1 %   Basophils Absolute 0.0 0.0 - 0.1 K/uL  Comprehensive metabolic panel     Status: Abnormal   Collection Time: 02/05/15 10:16 AM  Result Value Ref Range   Sodium 138 135 - 145 mmol/L   Potassium 3.6 3.5 - 5.1 mmol/L   Chloride 101 101 - 111 mmol/L   CO2 28 22 - 32 mmol/L   Glucose, Bld 174 (H) 65 - 99 mg/dL   BUN 16 6 - 20 mg/dL   Creatinine, Ser 0.63 0.44 - 1.00 mg/dL   Calcium 9.0 8.9 - 10.3 mg/dL   Total Protein 7.0 6.5 - 8.1 g/dL   Albumin 4.2 3.5 - 5.0 g/dL   AST 25 15 - 41 U/L   ALT 21 14 - 54 U/L   Alkaline Phosphatase 115 38 - 126 U/L   Total Bilirubin 0.6 0.3 - 1.2 mg/dL   GFR calc non Af Amer >60 >60 mL/min   GFR calc Af Amer >60 >60 mL/min    Comment: (NOTE) The eGFR has been calculated using the CKD EPI equation. This calculation has not been validated in all clinical situations. eGFR's persistently <60 mL/min signify possible Chronic Kidney Disease.    Anion gap 9 5 - 15  Protime-INR     Status: None   Collection Time: 02/05/15 10:16 AM  Result Value Ref Range    Prothrombin Time 14.0 11.6 - 15.2 seconds   INR 1.06 0.00 - 1.49  APTT     Status: None   Collection Time: 02/05/15 10:16 AM  Result Value Ref Range   aPTT 30 24 - 37 seconds  POC CBG, ED     Status: Abnormal   Collection Time: 02/05/15 10:31 AM  Result Value Ref Range   Glucose-Capillary 145 (H) 65 - 99 mg/dL   Ct Head Wo Contrast  02/05/2015   CLINICAL DATA:  Code stroke. Dizziness. Nausea  and vomiting. Symptoms began at 6:30 a.m. Multiple episodes of emesis.  EXAM: CT HEAD WITHOUT CONTRAST  TECHNIQUE: Contiguous axial images were obtained from the base of the skull through the vertex without intravenous contrast.  COMPARISON:  None.  FINDINGS: Acute hemorrhage is present within the right cerebellum measuring 4.0 x 3.6 cm. The estimated volume is 25-30 mL. This creates significant mass effect on the fourth ventricle. There surrounding edema. There is no hydrocephalus.  A lacunar infarct in the right caudate head is remote. No acute supratentorial infarct, hemorrhage, or mass lesion is present. There is no midline shift. The ventricles are of normal size. No significant extra-axial fluid collection is present.  The paranasal sinuses mastoid air cells are clear. The calvarium is intact.  IMPRESSION: 1. 4.0 cm acute hemorrhage within the right cerebellar hemisphere with surrounding edema and mass effect on the fourth ventricle. There is no hydrocephalus. The estimated volume is 25-30 mL. 2. Lacunar infarct of the right caudate head appears remote. 3. No acute supratentorial abnormality.  These results were called by telephone at the time of interpretation on 02/05/2015 at 10:35 am to Dr. Wallie Char, who verbally acknowledged these results.   Electronically Signed   By: San Morelle M.D.   On: 02/05/2015 10:37   Dg Chest Portable 1 View  02/05/2015   CLINICAL DATA:  Code stroke. Patient also with dizziness nausea and vomiting since this morning. History of COPD and CHF.  EXAM: PORTABLE CHEST -  1 VIEW  COMPARISON:  03/26/2013.  FINDINGS: Cardiac silhouette is normal size. No mediastinal or hilar masses or convincing adenopathy. Relatively low lung volumes. No lung consolidation or edema. No pleural effusion or pneumothorax.  Bony thorax is demineralized but grossly intact.  IMPRESSION: No acute cardiopulmonary disease.   Electronically Signed   By: Lajean Manes M.D.   On: 02/05/2015 10:33    Assessment: 80 y.o. female presenting with acute large right cerebellar hematoma with running edema and mass effect on fourth ventricle.  Stroke Risk Factors - hypertension  Plan: 1. ICU admission and Neurosurgery consult 2. MRA  of the brain without contrast when feasible 3. PT consult, OT consult, Speech consult 4. Echocardiogram 5. Carotid dopplers 6. Prophylactic therapy-None 7. Risk factor modification 8. HgbA1c, fasting lipid panel  This patient is critically ill and at significant risk of neurological worsening or death, and care requires constant monitoring of vital signs, hemodynamics,respiratory and cardiac monitoring, neurological assessment, discussion with family, other specialists and medical decision making of high complexity. Total critical care time was 55 minutes.  C.R. Nicole Kindred, MD [facility] [phone]  02/05/2015, 12:40 PM

## 2015-02-05 NOTE — ED Notes (Signed)
Neuro surgery at bedside.

## 2015-02-05 NOTE — Anesthesia Preprocedure Evaluation (Addendum)
Anesthesia Evaluation  Patient identified by MRN, date of birth, ID band Patient awake    Reviewed: Allergy & Precautions, NPO status , Patient's Chart, lab work & pertinent test results  Airway Mallampati: III  TM Distance: >3 FB Neck ROM: Limited    Dental  (+) Teeth Intact, Dental Advisory Given   Pulmonary COPD COPD inhaler, former smoker,  breath sounds clear to auscultation        Cardiovascular negative cardio ROS  Rhythm:Regular Rate:Normal     Neuro/Psych Right cerebellar intracerebral hemorrhage    GI/Hepatic Neg liver ROS, GERD-  ,  Endo/Other  negative endocrine ROS  Renal/GU negative Renal ROS     Musculoskeletal  (+) Arthritis -,   Abdominal   Peds  Hematology negative hematology ROS (+)   Anesthesia Other Findings   Reproductive/Obstetrics                           Anesthesia Physical Anesthesia Plan  ASA: IV and emergent  Anesthesia Plan: General   Post-op Pain Management:    Induction: Intravenous  Airway Management Planned: Oral ETT  Additional Equipment: Arterial line, CVP and Ultrasound Guidance Line Placement  Intra-op Plan:   Post-operative Plan: Extubation in OR and Possible Post-op intubation/ventilation  Informed Consent: I have reviewed the patients History and Physical, chart, labs and discussed the procedure including the risks, benefits and alternatives for the proposed anesthesia with the patient or authorized representative who has indicated his/her understanding and acceptance.   Dental advisory given  Plan Discussed with: CRNA, Anesthesiologist and Surgeon  Anesthesia Plan Comments:       Anesthesia Quick Evaluation

## 2015-02-05 NOTE — H&P (Signed)
CC:  Chief Complaint  Patient presents with  . Dizziness  . Nausea    HPI: [name] is a 80 y.o. female presenting to the emergency department today after waking up with fairly severe dizziness and nausea. She went to bed last night feeling normal. She is reporting occasional bouts of blurry vision which come and go. She does not have significant headache. She is not reporting any numbness, tingling, or weakness of the extremities. She is not complaining of facial asymmetry, or difficulty swallowing. Of note, the patient does have a remote history of breast cancer status post mastectomy, as well as more recently transurethral resection of bladder tumor.  PMH: Past Medical History  Diagnosis Date  . History of CHF (congestive heart failure)     POST SURG 2010  . COPD (chronic obstructive pulmonary disease)   . History of breast cancer     DX DCIS IN 2004--  S/P RIGHT MASTECTOMY , NO CHEMORADIATION---  NO RECURRENCE  . Glaucoma of both eyes   . History of atrial fibrillation without current medication 2010    POST SURG 2010--  PER PT NO ISSUES SINCE  . Arthritis   . Dyspnea on exertion   . GERD (gastroesophageal reflux disease)   . Nephrolithiasis   . History of shingles     02/ 2015--  back of neck and left flank--  no residual pain  . History of hypertension   . Recurrent bladder papillary carcinoma first dx 06/ 2014    s/p  turbt's  and instillation mitomycin c (chemo)    PSH: Past Surgical History  Procedure Laterality Date  . Total knee arthroplasty Right 03-22-2009  . Partial mastectomy with needle localization Right 01-14-2003    DCIS  . Total mastectomy Right 02-03-2003    W/ SLN BX  AND  POST 03-01-2003 EVACUATION HEMATOMA  . Removal cyst left hand  2013  . Transthoracic echocardiogram  03-25-2009    MILD LVF/ EF 70-80%/ MILD INCREASE SYSTOLIC PULMONARY  PRESSURE  . Transurethral resection of bladder tumor with gyrus (turbt-gyrus) N/A 03/26/2013     Procedure: TRANSURETHRAL RESECTION OF BLADDER TUMOR WITH GYRUS (TURBT-GYRUS);  Surgeon: Alexis Frock, MD;  Location: [facility];  Service: Urology;  Laterality: N/A;  . Cystoscopy w/ ureteral stent placement Bilateral 03/26/2013    Procedure: CYSTOSCOPY WITH BILATERAL RETROGRADE PYELOGRAM/URETERAL STENT PLACEMENT;  Surgeon: Alexis Frock, MD;  Location: [facility];  Service: Urology;  Laterality: Bilateral;  . Transurethral resection of bladder tumor with gyrus (turbt-gyrus) N/A 05/13/2013    Procedure: TRANSURETHRAL RESECTION OF BLADDER TUMOR WITH GYRUS (TURBT-GYRUS)  RE-STAGING TRANSURETHRAL RESECTION OF BLADDER TUMOR, LEFT RETROGRADE PYELOGRAM AND STENT EXCHANGE;  Surgeon: Alexis Frock, MD;  Location: [facility];  Service: Urology;  Laterality: N/A;  . Cystoscopy w/ ureteral stent placement Left 05/13/2013    Procedure: CYSTOSCOPY WITH RETROGRADE PYELOGRAM/URETERAL STENT PLACEMENT STENT EXCHANGE;  Surgeon: Alexis Frock, MD;  Location: [facility];  Service: Urology;  Laterality: Left;  . Tubal ligation    . Cataract extraction w/ intraocular lens  implant, bilateral    . Transurethral resection of bladder tumor with gyrus (turbt-gyrus) N/A 08/11/2014    Procedure: TRANSURETHRAL RESECTION OF BLADDER TUMOR WITH GYRUS (TURBT-GYRUS);  Surgeon: Alexis Frock, MD;  Location: [facility];  Service: Urology;  Laterality: N/A;  . Cystoscopy w/ retrogrades Bilateral 08/11/2014    Procedure: CYSTOSCOPY WITH BILATERAL RETROGRADE PYELOGRAM AND MITOMYCIN INSTILLATION;  Surgeon: Alexis Frock, MD;  Location: [place];  Service: Urology;  Laterality: Bilateral;    SH: History  Substance Use Topics  . Smoking status: Former Smoker -- 1.00 packs/day for 40 years    Types: Cigarettes    Quit date: 03/20/2003  . Smokeless tobacco: Never Used  . Alcohol Use: No    MEDS: Prior to Admission medications    Medication Sig Start Date End Date Taking? Authorizing Provider  aspirin 81 MG tablet Take 81 mg by mouth daily.   Yes Historical Provider, MD  latanoprost (XALATAN) 0.005 % ophthalmic solution Place 1 drop into both eyes at bedtime.   Yes Historical Provider, MD  levalbuterol [facility] HFA) 45 MCG/ACT inhaler Inhale 1-2 puffs into the lungs every 4 (four) hours as needed for wheezing.   Yes Historical Provider, MD  pantoprazole (PROTONIX) 40 MG tablet Take 40 mg by mouth daily. 12/03/14  Yes Historical Provider, MD  SIMBRINZA 1-0.2 % SUSP Place 1 drop into both eyes 2 (two) times daily.  12/23/14  Yes Historical Provider, MD  amoxicillin (AMOXIL) 500 MG capsule Prior to dentist appointment 12/29/13   Historical Provider, MD    ALLERGY: Allergies  Allergen Reactions  . Morphine And Related Nausea And Vomiting    ROS: ROS  NEUROLOGIC EXAM: Drowsy but easily arousable, oriented Memory and concentration grossly intact Speech slightly dysarthric but appropriate CN grossly intact Horizontal nystagmus present Motor exam: Upper Extremities Deltoid Bicep Tricep Grip  Right 5/5 5/5 5/5 5/5  Left 5/5 5/5 5/5 5/5   Lower Extremity IP Quad PF DF EHL  Right 5/5 5/5 5/5 5/5 5/5  Left 5/5 5/5 5/5 5/5 5/5   Sensation grossly intact to LT  IMGAING: CTH reveals ~3x3cm right lateral cerebellar lobar hemorrhage with mild surrounding edema. There is mild effacement of the fourth ventricle without HCP.  IMPRESSION: - 80 y.o. female with large right cerebellar hemorrhage causing 4th ventricular effacement but currently good neurologic exam.  PLAN: - Proceed with surgical evacuation of hematoma  I did review the CT findings with the patient and her husband. Treatment options were discussed including medical therapy and surgery. I did recommend surgery given her currently good neurologic condition to provide the best chance for recovery. Risks of surgery including but not limited to stroke, hematoma,  and CSF leak and need for further surgery were discussed. All questions were answered, and the patient and her husband both agreed to proceed with surgery.

## 2015-02-05 NOTE — Anesthesia Procedure Notes (Addendum)
Procedure Name: Intubation Date/Time: 02/05/2015 12:59 PM Performed by: Melina Copa, Ronnita Paz R Pre-anesthesia Checklist: Patient identified, Emergency Drugs available, Suction available, Patient being monitored and Timeout performed Patient Re-evaluated:Patient Re-evaluated prior to inductionOxygen Delivery Method: Circle system utilized Preoxygenation: Pre-oxygenation with 100% oxygen Intubation Type: IV induction and Rapid sequence Laryngoscope Size: Mac and 3 Grade View: Grade II Tube type: Subglottic suction tube Tube size: 7.5 mm Number of attempts: 1 Airway Equipment and Method: Stylet Placement Confirmation: ETT inserted through vocal cords under direct vision,  positive ETCO2 and breath sounds checked- equal and bilateral Secured at: 21 cm Tube secured with: Tape Dental Injury: Teeth and Oropharynx as per pre-operative assessment

## 2015-02-05 NOTE — ED Provider Notes (Signed)
CSN: [id number]     Arrival date & time 02/05/15  9794 History   First MD Initiated Contact with Patient 02/05/15 0945     Chief Complaint  Patient presents with  . Dizziness  . Nausea     (Consider location/radiation/quality/duration/timing/severity/associated sxs/prior Treatment) HPI  [name] is a 80 y.o. female with PMH of CHR, COPD, HTN, bladder papillary carcinoma presenting with dizziness, room spinning that started about an hour after she woke up this morning around 6:30 am.  Pt denies positional component. Dizziness constant and abrupt in onset. Pt states she feels like the room is spinning with diplopia without associated headache or pain. Pt with nausea and one episode of vomiting in ED. Husband also reports speech abnormality described as slurred. No chest pain, shortness of breath. Pt denies history of vertigo or prior TIA, CVA.   Past Medical History  Diagnosis Date  . History of CHF (congestive heart failure)     POST SURG 2010  . COPD (chronic obstructive pulmonary disease)   . History of breast cancer     DX DCIS IN 2004--  S/P RIGHT MASTECTOMY , NO CHEMORADIATION---  NO RECURRENCE  . Glaucoma of both eyes   . History of atrial fibrillation without current medication 2010    POST SURG 2010--  PER PT NO ISSUES SINCE  . Arthritis   . Dyspnea on exertion   . GERD (gastroesophageal reflux disease)   . Nephrolithiasis   . History of shingles     02/ 2015--  back of neck and left flank--  no residual pain  . History of hypertension   . Recurrent bladder papillary carcinoma first dx 06/ 2014    s/p  turbt's  and instillation mitomycin c (chemo)   Past Surgical History  Procedure Laterality Date  . Total knee arthroplasty Right 03-22-2009  . Partial mastectomy with needle localization Right 01-14-2003    DCIS  . Total mastectomy Right 02-03-2003    W/ SLN BX  AND  POST 03-01-2003 EVACUATION HEMATOMA  . Removal cyst left hand  2013  . Transthoracic  echocardiogram  03-25-2009    MILD LVF/ EF 70-80%/ MILD INCREASE SYSTOLIC PULMONARY  PRESSURE  . Transurethral resection of bladder tumor with gyrus (turbt-gyrus) N/A 03/26/2013    Procedure: TRANSURETHRAL RESECTION OF BLADDER TUMOR WITH GYRUS (TURBT-GYRUS);  Surgeon: Alexis Frock, MD;  Location: [facility];  Service: Urology;  Laterality: N/A;  . Cystoscopy w/ ureteral stent placement Bilateral 03/26/2013    Procedure: CYSTOSCOPY WITH BILATERAL RETROGRADE PYELOGRAM/URETERAL STENT PLACEMENT;  Surgeon: Alexis Frock, MD;  Location: [facility];  Service: Urology;  Laterality: Bilateral;  . Transurethral resection of bladder tumor with gyrus (turbt-gyrus) N/A 05/13/2013    Procedure: TRANSURETHRAL RESECTION OF BLADDER TUMOR WITH GYRUS (TURBT-GYRUS)  RE-STAGING TRANSURETHRAL RESECTION OF BLADDER TUMOR, LEFT RETROGRADE PYELOGRAM AND STENT EXCHANGE;  Surgeon: Alexis Frock, MD;  Location: [facility];  Service: Urology;  Laterality: N/A;  . Cystoscopy w/ ureteral stent placement Left 05/13/2013    Procedure: CYSTOSCOPY WITH RETROGRADE PYELOGRAM/URETERAL STENT PLACEMENT STENT EXCHANGE;  Surgeon: Alexis Frock, MD;  Location: [facility];  Service: Urology;  Laterality: Left;  . Tubal ligation    . Cataract extraction w/ intraocular lens  implant, bilateral    . Transurethral resection of bladder tumor with gyrus (turbt-gyrus) N/A 08/11/2014    Procedure: TRANSURETHRAL RESECTION OF BLADDER TUMOR WITH GYRUS (TURBT-GYRUS);  Surgeon: Alexis Frock, MD;  Location: [facility];  Service: Urology;  Laterality: N/A;  . Cystoscopy w/ retrogrades Bilateral 08/11/2014    Procedure: CYSTOSCOPY WITH BILATERAL RETROGRADE PYELOGRAM AND MITOMYCIN INSTILLATION;  Surgeon: Alexis Frock, MD;  Location: [facility];  Service: Urology;  Laterality: Bilateral;   Family History  Problem Relation Age of Onset  . Colon cancer Mother 95   . Colon cancer Maternal Grandmother   . Breast cancer Maternal Aunt 74  . Cancer Maternal Aunt     ? ovarian  . Breast cancer Other 60    breast cancer, ovarian cancer  . Breast cancer Daughter 67   History  Substance Use Topics  . Smoking status: Former Smoker -- 1.00 packs/day for 40 years    Types: Cigarettes    Quit date: 03/20/2003  . Smokeless tobacco: Never Used  . Alcohol Use: No   OB History    Gravida Para Term Preterm AB TAB SAB Ectopic Multiple Living   _0 Review of Systems 10 Systems reviewed and are negative for acute change except as noted in the HPI.    Allergies  Morphine and related  Home Medications   Prior to Admission medications   Medication Sig Start Date End Date Taking? Authorizing Provider  aspirin 81 MG tablet Take 81 mg by mouth daily.   Yes Historical Provider, MD  latanoprost (XALATAN) 0.005 % ophthalmic solution Place 1 drop into both eyes at bedtime.   Yes Historical Provider, MD  levalbuterol [facility] HFA) 45 MCG/ACT inhaler Inhale 1-2 puffs into the lungs every 4 (four) hours as needed for wheezing.   Yes Historical Provider, MD  pantoprazole (PROTONIX) 40 MG tablet Take 40 mg by mouth daily. 12/03/14  Yes Historical Provider, MD  SIMBRINZA 1-0.2 % SUSP Place 1 drop into both eyes 2 (two) times daily.  12/23/14  Yes Historical Provider, MD  amoxicillin (AMOXIL) 500 MG capsule Prior to dentist appointment 12/29/13   Historical Provider, MD   BP 188/95 mmHg  Pulse 57  Temp(Src) 92.1 F (33.4 C) (Rectal)  Resp 18  Ht _1  (1.676 m)  Wt 168 lb 8 oz (76.431 kg)  BMI 27.21 kg/m2  SpO2 95%  LMP 12/03/2002 Physical Exam  Constitutional: She is oriented to person, place, and time. She appears well-developed and well-nourished. No distress.  HENT:  Head: Normocephalic and atraumatic.  Mouth/Throat: Oropharynx is clear and moist.  Eyes: Conjunctivae and EOM are normal. Pupils are equal, round, and reactive to light. Right eye  exhibits no discharge. Left eye exhibits no discharge.  Pt with horizontal nystagmus in all fields of vision.  Neck: Normal range of motion. Neck supple.  Cardiovascular: Normal rate and regular rhythm.   Pulmonary/Chest: Effort normal and breath sounds normal. No respiratory distress. She has no wheezes.  Diffuse wheezing  Abdominal: Soft. Bowel sounds are normal. She exhibits no distension. There is no tenderness.  Neurological: She is alert and oriented to person, place, and time. No cranial nerve deficit. Coordination normal.  Speech is slightly slurred and goal oriented.  Strength 5/5 in upper and lower extremities. Sensation intact. Pt with right sided ataxia with heel to shin and finger to nose.  No pronator drift.   Skin: Skin is warm and dry. She is not diaphoretic.  Nursing note and vitals reviewed.   ED Course  Procedures (including critical care time) Labs Review Labs Reviewed  CBC WITH DIFFERENTIAL/PLATELET - Abnormal; Notable for the following:  WBC 12.4 (*)    Neutrophils Relative % 79 (*)    Neutro Abs 9.7 (*)    All other components within normal limits  COMPREHENSIVE METABOLIC PANEL - Abnormal; Notable for the following:    Glucose, Bld 174 (*)    All other components within normal limits  PROTIME-INR  APTT  URINALYSIS, ROUTINE W REFLEX MICROSCOPIC (NOT AT [facility])  CBG MONITORING, ED  I-STAT TROPOININ, ED    Imaging Review Ct Head Wo Contrast  02/05/2015   CLINICAL DATA:  Code stroke. Dizziness. Nausea and vomiting. Symptoms began at 6:30 a.m. Multiple episodes of emesis.  EXAM: CT HEAD WITHOUT CONTRAST  TECHNIQUE: Contiguous axial images were obtained from the base of the skull through the vertex without intravenous contrast.  COMPARISON:  None.  FINDINGS: Acute hemorrhage is present within the right cerebellum measuring 4.0 x 3.6 cm. The estimated volume is 25-30 mL. This creates significant mass effect on the fourth ventricle. There surrounding edema. There is no  hydrocephalus.  A lacunar infarct in the right caudate head is remote. No acute supratentorial infarct, hemorrhage, or mass lesion is present. There is no midline shift. The ventricles are of normal size. No significant extra-axial fluid collection is present.  The paranasal sinuses mastoid air cells are clear. The calvarium is intact.  IMPRESSION: 1. 4.0 cm acute hemorrhage within the right cerebellar hemisphere with surrounding edema and mass effect on the fourth ventricle. There is no hydrocephalus. The estimated volume is 25-30 mL. 2. Lacunar infarct of the right caudate head appears remote. 3. No acute supratentorial abnormality.  These results were called by telephone at the time of interpretation on 02/05/2015 at 10:35 am to Dr. Wallie Char, who verbally acknowledged these results.   Electronically Signed   By: San Morelle M.D.   On: 02/05/2015 10:37   Dg Chest Portable 1 View  02/05/2015   CLINICAL DATA:  Code stroke. Patient also with dizziness nausea and vomiting since this morning. History of COPD and CHF.  EXAM: PORTABLE CHEST - 1 VIEW  COMPARISON:  03/26/2013.  FINDINGS: Cardiac silhouette is normal size. No mediastinal or hilar masses or convincing adenopathy. Relatively low lung volumes. No lung consolidation or edema. No pleural effusion or pneumothorax.  Bony thorax is demineralized but grossly intact.  IMPRESSION: No acute cardiopulmonary disease.   Electronically Signed   By: Lajean Manes M.D.   On: 02/05/2015 10:33     EKG Interpretation   Date/Time:  Saturday February 05 2015 09:54:13 EDT Ventricular Rate:  53 PR Interval:  180 QRS Duration: 99 QT Interval:  488 QTC Calculation: 458 R Axis:   11 Text Interpretation:  Sinus rhythm Left atrial enlargement Abnormal R-wave  progression, early transition LVH with secondary repolarization  abnormality Minimal ST elevation, inferior leads No significant change  since last tracing Confirmed by Winfred Leeds  MD, SAM [phone]) on  02/05/2015  10:03:34 AM      CRITICAL CARE Performed by: Al Corpus   Total critical care time: 35  Critical care time was exclusive of separately billable procedures and treating other patients.  Critical care was necessary to treat or prevent imminent or life-threatening deterioration.  Critical care was time spent personally by me on the following activities: development of treatment plan with patient and/or surrogate as well as nursing, discussions with consultants, evaluation of patient's response to treatment, examination of patient, obtaining history from patient or surrogate, ordering and performing treatments and interventions, ordering and review of laboratory studies, ordering and  review of radiographic studies, pulse oximetry and re-evaluation of patient's condition.   MDM   Final diagnoses:  Dizziness  Acute cerebellar hemorrhage   Pt last normal around 6:30 am this morning. No history of CVA. VSS. Pt dizziness and difficulty speaking. Pt with horizontal nystagmus in all fields of vision. Right sided ataxia. Pt oriented x3. No facial droop.  Pt speech slightly slurred and goal oriented. No pronator drift. Pt actively vomiting. Code stroke initiated. CT head with cerebellar hemorrhage. Dr. Rochele Pages bedside and admitting patient. Consult to neurosurgery, Dr. Kathyrn Sheriff who will evaluate patient. Of note pt with hypothermia of 92.1 F possibly related to hemorrhage. Blairhugger applied and blood cultures ordered. Pt evaluated by Neurosurgery with plan for surgery.   Discussed all results and patient and husband who is bedside who both verbalizes understanding and agrees with plan.  This is a shared patient. This patient was discussed with the physician who saw and evaluated the patient and agrees with the plan.     Al Corpus, PA-C 02/05/15 [place], MD 02/05/15 [phone]

## 2015-02-05 NOTE — ED Provider Notes (Signed)
Seen on arrival .Patient last normal 6:30 AM today. She awakened at 545 today feeling normal. She felt vertigo and nausea sudden onset 6:30 AM today accompanied by difficulty speaking. On exam alert Glasgow Coma Score 15 HEENT exam no facial asymmetry eyes horizontal nystatin this neck supple no bruit heart bradycardic regular rhythm neurologic cranial nerves II through XII grossly intact moves all extremities. Speech is slightly slurred. Code stroke called based on history, speech abnormality, stroke risk factors. 10:30 AM states her dizziness is improved. She is alert. Speech mildly slurred. Exam essentially unchanged. Dr. Nicole Kindred here from neurology. Neurosurgery called to evaluate CT scan  10:15 AM exam unchanged. I spoke with Dr.Nundkumar, neurosurgeon who will come to evaluate patient and discuss surgery and evacuation of hematoma with patient and with her husband. Patient noted to be hypothermic area Retail banker ordered. We'll pan culture. 1150 am Spoke with Dr. Kathyrn Sheriff after he evalauted pt. He will take pt to operation room. CRITICAL CARE Performed by: Orlie Dakin Total critical care time: 45 minutes Critical care time was exclusive of separately billable procedures and treating other patients. Critical care was necessary to treat or prevent imminent or life-threatening deterioration. Critical care was time spent personally by me on the following activities: development of treatment plan with patient and/or surrogate as well as nursing, discussions with consultants, evaluation of patient's response to treatment, examination of patient, obtaining history from patient or surrogate, ordering and performing treatments and interventions, ordering and review of laboratory studies, ordering and review of radiographic studies, pulse oximetry and re-evaluation of patient's condition.  Orlie Dakin, MD 02/05/15 1204

## 2015-02-06 ENCOUNTER — Inpatient Hospital Stay (HOSPITAL_COMMUNITY): Payer: Medicare Other

## 2015-02-06 DIAGNOSIS — J449 Chronic obstructive pulmonary disease, unspecified: Secondary | ICD-10-CM

## 2015-02-06 DIAGNOSIS — E785 Hyperlipidemia, unspecified: Secondary | ICD-10-CM

## 2015-02-06 LAB — CBC
HCT: 36.4 % (ref 36.0–46.0)
Hemoglobin: 12 g/dL (ref 12.0–15.0)
MCH: 30.6 pg (ref 26.0–34.0)
MCHC: 33 g/dL (ref 30.0–36.0)
MCV: 92.9 fL (ref 78.0–100.0)
PLATELETS: 207 10*3/uL (ref 150–400)
RBC: 3.92 MIL/uL (ref 3.87–5.11)
RDW: 13.7 % (ref 11.5–15.5)
WBC: 13.8 10*3/uL — ABNORMAL HIGH (ref 4.0–10.5)

## 2015-02-06 LAB — LIPID PANEL
CHOL/HDL RATIO: 3.4 ratio
Cholesterol: 175 mg/dL (ref 0–200)
HDL: 51 mg/dL (ref >40)
LDL CALC: 101 mg/dL — AB (ref 0–99)
Triglycerides: 117 mg/dL (ref <150)
VLDL: 23 mg/dL (ref 0–40)

## 2015-02-06 LAB — TSH: TSH: 0.278 u[IU]/mL — ABNORMAL LOW (ref 0.350–4.500)

## 2015-02-06 LAB — BASIC METABOLIC PANEL
Anion gap: 8 (ref 5–15)
BUN: 10 mg/dL (ref 6–20)
CALCIUM: 8.4 mg/dL — AB (ref 8.9–10.3)
CO2: 27 mmol/L (ref 22–32)
CREATININE: 0.53 mg/dL (ref 0.44–1.00)
Chloride: 103 mmol/L (ref 101–111)
GFR calc non Af Amer: >60 mL/min (ref >60)
GLUCOSE: 124 mg/dL — AB (ref 65–99)
Potassium: 3.9 mmol/L (ref 3.5–5.1)
Sodium: 138 mmol/L (ref 135–145)

## 2015-02-06 LAB — T4, FREE: Free T4: 0.97 ng/dL (ref 0.61–1.12)

## 2015-02-06 MED ORDER — ATORVASTATIN CALCIUM 10 MG PO TABS
10.0000 mg | ORAL_TABLET | Freq: Every day | ORAL | Status: DC
  Administered 2015-02-06 – 2015-02-13 (×8): 10 mg via ORAL
  Filled 2015-02-06 (×9): qty 1

## 2015-02-06 NOTE — Evaluation (Signed)
Speech Language Pathology Evaluation Patient Details Name: [name] MRN: [id number] DOB: 1935/03/12 Today's Date: 02/06/2015 Time: 6010-9323 SLP Time Calculation (min) (ACUTE ONLY): 18 min  HPI:  [name] is an 80 y.o. female with a history of congestive heart failure, COPD, breast cancer, transient atrial fibrillation and hypertension, presenting with acute onset of ataxia with vertigo, nausea and blurred vision at 6 AM this morning. She has been on aspirin 81 mg most days. He has not been on anticoagulation. CT scan of her head showed a large right hemispheric cerebellar hematoma measuring 4.0 x 3.6 mm. There was rounding edema with significant mass effect on the fourth ventricle. There were no signs of hydrocephalus. NIH stroke score was 4.  The patient is s/p a craniotomy on 02/06/2105.     Assessment / Plan / Recommendation Clinical Impression  The patient's language and cognitive skills were assessed.  Basic receptive and expressive language skills as well as motor speech appear to be intact.   The patient achieved a score of 27/30 on the Mini Mental State Exam.  Difficulties were noted for her ability to write a sentence and to copy a design.  For the sentence formulation the patient stated "The sky is blue".  However, when she put pen to paper she was unable to write the words.  She was able to copy her name. It's hard to tell exactly the role the ataxia/poor hand coordination is playing or if she truly has writing deficit.  ST will continue to follow to further assess need for follow up ST.      SLP Assessment  Patient needs continued Speech Lanaguage Pathology Services    Follow Up Recommendations   (to be determined)    Frequency and Duration min 1 x/week  2 weeks   Pertinent Vitals/Pain Pain Assessment: No/denies pain   SLP Goals  Patient/Family Stated Goal: none stated Potential to Achieve Goals (ACUTE ONLY): Good  SLP Evaluation Prior Functioning  Cognitive/Linguistic Baseline: Within functional limits Type of Home: House  Lives With: Spouse Available Help at Discharge: Family   Cognition  Overall Cognitive Status: Within Functional Limits for tasks assessed Arousal/Alertness: Awake/alert Orientation Level: Oriented X4 Attention: Sustained Sustained Attention: Appears intact Memory: Appears intact Awareness: Appears intact Problem Solving: Appears intact Executive Function: Decision Making Decision Making: Appears intact Safety/Judgment: Appears intact    Comprehension  Auditory Comprehension Overall Auditory Comprehension: Appears within functional limits for tasks assessed Yes/No Questions: Within Functional Limits Commands: Within Functional Limits Conversation: Complex Reading Comprehension Reading Status: Within funtional limits    Expression Expression Primary Mode of Expression: Verbal Verbal Expression Overall Verbal Expression: Appears within functional limits for tasks assessed Initiation: No impairment Automatic Speech: Name;Social Response Level of Generative/Spontaneous Verbalization: Conversation Repetition: No impairment Naming: No impairment Pragmatics: No impairment Non-Verbal Means of Communication: Not applicable Written Expression Dominant Hand: Right Written Expression: Exceptions to [facility] Copy Ability: Word (The patient was asked to copy her name.  ) Self Formulation Ability: Sentence (The patient was asked to write a sentence and she was unable)   Oral / Motor Oral Motor/Sensory Function Overall Oral Motor/Sensory Function: Appears within functional limits for tasks assessed Motor Speech Overall Motor Speech: Appears within functional limits for tasks assessed Respiration: Within functional limits Phonation: Normal Resonance: Within functional limits Articulation: Within functional limitis Intelligibility: Intelligible Motor Planning: Witnin functional limits Motor Speech Errors: Not  applicable   GO     Lamar Sprinkles 02/06/2015, 10:13 AM  Shelly Flatten,  [place], [place] [facility] SLP [phone]

## 2015-02-06 NOTE — Progress Notes (Signed)
Pt seen and examined. No issues overnight. Pt has no significant complaints.  EXAM: Temp:  [96.2 F (35.7 C)-98.3 F (36.8 C)] 98.3 F (36.8 C) (06/05 0800) Pulse Rate:  [54-92] 90 (06/05 0900) Resp:  [15-25] 19 (06/05 0900) BP: (121-170)/(53-115) 145/60 mmHg (06/05 0900) SpO2:  [92 %-100 %] 93 % (06/05 0900) Arterial Line BP: (134-191)/(64-81) 157/81 mmHg (06/05 0900) Weight:  [79 kg (174 lb 2.6 oz)] 79 kg (174 lb 2.6 oz) (06/05 0530) Intake/Output      06/04 0701 - 06/05 0700 06/05 0701 - 06/06 0700   P.O. 60    I.V. (mL/kg) 2300 (29.1)    IV Piggyback 225    Total Intake(mL/kg) 2585 (32.7)    Urine (mL/kg/hr) 1900    Blood 125    Total Output 2025     Net +560           Awake, alert, oriented Speech is dysarthric but appropriate (+) horizontal/vertical nystagmus Moving all extremities well Wound c/d/i, no leak  LABS: Lab Results  Component Value Date   CREATININE 0.53 02/06/2015   BUN 10 02/06/2015   NA 138 02/06/2015   K 3.9 02/06/2015   CL 103 02/06/2015   CO2 27 02/06/2015   Lab Results  Component Value Date   WBC 13.8* 02/06/2015   HGB 12.0 02/06/2015   HCT 36.4 02/06/2015   MCV 92.9 02/06/2015   PLT 207 02/06/2015    IMPRESSION: - 80 y.o. female POD#1 s/p evac of cerebellar hematoma, doing well  PLAN: - Cont observation in ICU - Repeat CTH today - SLP/PT/OT eval

## 2015-02-06 NOTE — Progress Notes (Signed)
STROKE TEAM PROGRESS NOTE   HISTORY [name] is an 80 y.o. female with a history of congestive heart failure, COPD, breast cancer, transient atrial fibrillation and hypertension, presenting with acute onset of ataxia with vertigo, nausea and blurred vision at 6 AM this morning. She has been on aspirin 81 mg most days. She has not been on anticoagulation. CT scan of her head showed a large right hemispheric cerebellar hematoma measuring 4.0 x 3.6 mm. There was rounding edema with significant mass effect on the fourth ventricle. There were no signs of hydrocephalus. NIH stroke score was 4.  LSN: 6 AM on 02/05/2015 tPA Given: No: Acute cerebellar hemorrhage mRankin:   SUBJECTIVE (INTERVAL HISTORY) Her daughter and the husband are at the bedside.  Overall she feels her condition is stable. She denies any headache, but still has double vision. She had right cerebellar hematoma evacuation yesterday. Repeat CT head this morning pending.  OBJECTIVE Temp:  [96.2 F (35.7 C)-98.3 F (36.8 C)] 98.3 F (36.8 C) (06/05 0800) Pulse Rate:  [62-92] 87 (06/05 1300) Cardiac Rhythm:  [-] Normal sinus rhythm (06/04 1921) Resp:  [15-27] 22 (06/05 1300) BP: (121-149)/(53-115) 149/64 mmHg (06/05 1300) SpO2:  [92 %-100 %] 92 % (06/05 1300) Arterial Line BP: (134-191)/(64-81) 157/81 mmHg (06/05 0900) Weight:  [174 lb 2.6 oz (79 kg)] 174 lb 2.6 oz (79 kg) (06/05 0530)   Recent Labs Lab 02/05/15 1031  GLUCAP 145*    Recent Labs Lab 02/05/15 1016 02/05/15 1508 02/06/15 0934  NA 138 138 138  K 3.6 3.8 3.9  CL 101  --  103  CO2 28  --  27  GLUCOSE 174* 163* 124*  BUN 16  --  10  CREATININE 0.63  --  0.53  CALCIUM 9.0  --  8.4*    Recent Labs Lab 02/05/15 1016  AST 25  ALT 21  ALKPHOS 115  BILITOT 0.6  PROT 7.0  ALBUMIN 4.2    Recent Labs Lab 02/05/15 1016 02/05/15 1508 02/06/15 0934  WBC 12.4*  --  13.8*  NEUTROABS 9.7*  --   --   HGB 14.8 12.9 12.0  HCT 44.9 38.0 36.4   MCV 93.9  --  92.9  PLT 213  --  207   No results for input(s): CKTOTAL, CKMB, CKMBINDEX, TROPONINI in the last 168 hours.  Recent Labs  02/05/15 1016  LABPROT 14.0  INR 1.06    Recent Labs  02/05/15 1210  COLORURINE YELLOW  LABSPEC 1.015  PHURINE 6.5  GLUCOSEU NEGATIVE  HGBUR NEGATIVE  BILIRUBINUR NEGATIVE  KETONESUR NEGATIVE  PROTEINUR NEGATIVE  UROBILINOGEN 0.2  NITRITE NEGATIVE  LEUKOCYTESUR TRACE*       Component Value Date/Time   CHOL 175 02/06/2015 0933   TRIG 117 02/06/2015 0933   HDL 51 02/06/2015 0933   CHOLHDL 3.4 02/06/2015 0933   VLDL 23 02/06/2015 0933   LDLCALC 101* 02/06/2015 0933   No results found for: HGBA1C No results found for: LABOPIA, COCAINSCRNUR, LABBENZ, AMPHETMU, THCU, LABBARB  No results for input(s): ETH in the last 168 hours.  Ct Head Wo Contrast 02/05/2015    1) 4.0 cm acute hemorrhage within the right cerebellar hemisphere with surrounding edema and mass effect on the fourth ventricle. There is no hydrocephalus. The estimated volume is 25-30 mL. 2) Lacunar infarct of the right caudate head appears remote.  3) No acute supratentorial abnormality.    CT repeat - pending  Dg Chest Portable 1 View 02/05/2015  No acute cardiopulmonary disease.   2D echo - pending  PHYSICAL EXAM  Temp:  [96.2 F (35.7 C)-98.3 F (36.8 C)] 98.3 F (36.8 C) (06/05 0800) Pulse Rate:  [62-92] 87 (06/05 1300) Resp:  [15-27] 22 (06/05 1300) BP: (121-149)/(53-115) 149/64 mmHg (06/05 1300) SpO2:  [92 %-100 %] 92 % (06/05 1300) Arterial Line BP: (134-191)/(64-81) 157/81 mmHg (06/05 0900) Weight:  [174 lb 2.6 oz (79 kg)] 174 lb 2.6 oz (79 kg) (06/05 0530)  General - Well nourished, well developed, in no apparent distress.  Ophthalmologic - Fundi not visualized due to eye movement and nystagmus.  Cardiovascular - Regular rate and rhythm.  Mental Status -  Level of arousal and orientation to time, place, and person were intact. Language  including expression, naming, repetition, comprehension was assessed and found intact. Fund of Knowledge was assessed and was intact.  Cranial Nerves II - XII - II - Visual field intact OU. III, IV, VI - Extraocular movements showed mild right gaze difficulty with mild right CN VI palsy. V - Facial sensation intact bilaterally. VII - Facial movement intact bilaterally, but right nasolabial fold flattening. VIII - Hearing & vestibular intact bilaterally, horizontal nystagmus on right side gaze, rotational nystagmus on upward and downward gaze. X - Palate elevates symmetrically. XI - Chin turning & shoulder shrug intact bilaterally. XII - Tongue protrusion intact.  Motor Strength - The patient's strength was normal in all extremities and pronator drift was absent.  Bulk was normal and fasciculations were absent.   Motor Tone - Muscle tone was assessed at the neck and appendages and was normal.  Reflexes - The patient's reflexes were 1+ in all extremities and she had no pathological reflexes.  Sensory - Light touch, temperature/pinprick were assessed and were symmetrical.    Coordination - The patient had ataxia on the right FTN and HTS, grossly intact on the left.  Tremor was absent.  Gait and Station - not tested due to safety concerns.   ASSESSMENT/PLAN [name] is a 80 y.o. female with history of COPD, breast cancer on remission, bladder cancer s/p chemotherapy and surgery, paroxysmal A. fib on aspirin and the CHF presenting with ataxia, vertigo and nausea vomiting. CT showed right cerebellar ICH. She received right cerebellar hematoma evacuation.     Right cerebellar ICH:  S/p hematoma evacuation. Etiology not clear, HTN, vs. CAA vs. AVM vs. Brain mets. Patient blood pressure well controlled at home and after admission, malignancy also in remission. Will need follow-up MRI, however patient reluctant to do any MRI.  Resultant  diplopia, nystagmus, ataxia  CT showed right  cerebellar hemorrhage  Repeat CT pending  2D Echo  pending  LDL 101  HgbA1c pending  Heparin subcutaneous for VTE prophylaxis Diet clear liquid Room service appropriate?: Yes; Fluid consistency:: Thin  aspirin 81 mg orally every day prior to admission, now on no antithrombotic  Ongoing aggressive stroke risk factor management  Therapy recommendations:  Pending  Disposition:  Pending  Hyperlipidemia  LDL 101, goal < 70  Add Lipitor 10  Continue statin at discharge  Paroxysmal A. Fib  Patient stated that she had a one time A. fib when she was admitted for knee surgery complicated with pneumonia. Not sure if had further A. fib episode since then  No palpitation at home  On aspirin 81  Patient is likely not anticoagulation candidate.  Other Stroke Risk Factors  Advanced age  Former smoker for 40 years  Breast cancer in remission  Bladder cancer were treated last December  CHF  Other Active Problems  Leukocytosis  Hospital day # 1  This patient is critically ill due to right cerebellar ICH s/p evacuation and at significant risk of neurological worsening, death form cerebellar edema, transtentorial herniation, obstructive hydrocephalus and respiratory failure. This patient's care requires constant monitoring of vital signs, hemodynamics, respiratory and cardiac monitoring, review of multiple databases, neurological assessment, discussion with family, other specialists and medical decision making of high complexity. I spent 45 minutes of neurocritical care time in the care of this patient.  Rosalin Hawking, MD PhD Stroke Neurology 02/06/2015 1:32 PM   To contact Stroke Continuity provider, please refer to [email or web address] After hours, contact General Neurology

## 2015-02-07 ENCOUNTER — Encounter (HOSPITAL_COMMUNITY): Payer: Self-pay | Admitting: Neurosurgery

## 2015-02-07 ENCOUNTER — Inpatient Hospital Stay (HOSPITAL_COMMUNITY): Payer: Medicare Other

## 2015-02-07 DIAGNOSIS — I5042 Chronic combined systolic (congestive) and diastolic (congestive) heart failure: Secondary | ICD-10-CM

## 2015-02-07 DIAGNOSIS — I6789 Other cerebrovascular disease: Secondary | ICD-10-CM

## 2015-02-07 DIAGNOSIS — I10 Essential (primary) hypertension: Secondary | ICD-10-CM

## 2015-02-07 DIAGNOSIS — E876 Hypokalemia: Secondary | ICD-10-CM

## 2015-02-07 LAB — CBC
HEMATOCRIT: 34.7 % — AB (ref 36.0–46.0)
Hemoglobin: 11.4 g/dL — ABNORMAL LOW (ref 12.0–15.0)
MCH: 30.9 pg (ref 26.0–34.0)
MCHC: 32.9 g/dL (ref 30.0–36.0)
MCV: 94 fL (ref 78.0–100.0)
Platelets: 183 10*3/uL (ref 150–400)
RBC: 3.69 MIL/uL — ABNORMAL LOW (ref 3.87–5.11)
RDW: 13.7 % (ref 11.5–15.5)
WBC: 12.2 10*3/uL — AB (ref 4.0–10.5)

## 2015-02-07 LAB — URINALYSIS, ROUTINE W REFLEX MICROSCOPIC
Bilirubin Urine: NEGATIVE
GLUCOSE, UA: NEGATIVE mg/dL
Hgb urine dipstick: NEGATIVE
Ketones, ur: NEGATIVE mg/dL
Leukocytes, UA: NEGATIVE
Nitrite: NEGATIVE
PROTEIN: NEGATIVE mg/dL
Specific Gravity, Urine: 1.007 (ref 1.005–1.030)
Urobilinogen, UA: 0.2 mg/dL (ref 0.0–1.0)
pH: 7 (ref 5.0–8.0)

## 2015-02-07 LAB — SODIUM
SODIUM: 143 mmol/L (ref 135–145)
Sodium: 138 mmol/L (ref 135–145)
Sodium: 142 mmol/L (ref 135–145)

## 2015-02-07 LAB — TROPONIN I: Troponin I: <0.03 ng/mL (ref <0.031)

## 2015-02-07 LAB — BASIC METABOLIC PANEL
ANION GAP: 9 (ref 5–15)
Anion gap: 9 (ref 5–15)
BUN: 10 mg/dL (ref 6–20)
BUN: 8 mg/dL (ref 6–20)
CALCIUM: 8.4 mg/dL — AB (ref 8.9–10.3)
CALCIUM: 8.3 mg/dL — AB (ref 8.9–10.3)
CHLORIDE: 102 mmol/L (ref 101–111)
CO2: 29 mmol/L (ref 22–32)
CO2: 32 mmol/L (ref 22–32)
CREATININE: 0.53 mg/dL (ref 0.44–1.00)
Chloride: 99 mmol/L — ABNORMAL LOW (ref 101–111)
Creatinine, Ser: 0.53 mg/dL (ref 0.44–1.00)
GFR calc Af Amer: >60 mL/min (ref >60)
GFR calc non Af Amer: >60 mL/min (ref >60)
GLUCOSE: 111 mg/dL — AB (ref 65–99)
Glucose, Bld: 115 mg/dL — ABNORMAL HIGH (ref 65–99)
POTASSIUM: 4 mmol/L (ref 3.5–5.1)
Potassium: 3.1 mmol/L — ABNORMAL LOW (ref 3.5–5.1)
Sodium: 137 mmol/L (ref 135–145)
Sodium: 143 mmol/L (ref 135–145)

## 2015-02-07 LAB — HEMOGLOBIN A1C
Hgb A1c MFr Bld: 6.2 % — ABNORMAL HIGH (ref 4.8–5.6)
MEAN PLASMA GLUCOSE: 131 mg/dL

## 2015-02-07 LAB — MAGNESIUM: Magnesium: 2 mg/dL (ref 1.7–2.4)

## 2015-02-07 MED ORDER — SODIUM CHLORIDE 3 % IV SOLN
INTRAVENOUS | Status: DC
  Administered 2015-02-07 – 2015-02-08 (×3): 50 mL/h via INTRAVENOUS
  Administered 2015-02-08: 75 mL/h via INTRAVENOUS
  Administered 2015-02-09 (×2): 100 mL/h via INTRAVENOUS
  Administered 2015-02-09: 75 mL/h via INTRAVENOUS
  Administered 2015-02-09 – 2015-02-10 (×5): 100 mL/h via INTRAVENOUS
  Administered 2015-02-11: 75 mL/h via INTRAVENOUS
  Administered 2015-02-11 (×2): 100 mL/h via INTRAVENOUS
  Administered 2015-02-11: 75 mL/h via INTRAVENOUS
  Administered 2015-02-12: 25 mL/h via INTRAVENOUS
  Administered 2015-02-12: 50 mL/h via INTRAVENOUS
  Administered 2015-02-12: 75 mL/h via INTRAVENOUS
  Administered 2015-02-12: 50 mL/h via INTRAVENOUS
  Filled 2015-02-07 (×35): qty 500

## 2015-02-07 MED ORDER — DILTIAZEM LOAD VIA INFUSION
15.0000 mg | Freq: Once | INTRAVENOUS | Status: AC
  Administered 2015-02-07: 15 mg via INTRAVENOUS

## 2015-02-07 MED ORDER — LISINOPRIL 20 MG PO TABS
20.0000 mg | ORAL_TABLET | Freq: Every day | ORAL | Status: DC
Start: 2015-02-07 — End: 2015-02-07
  Administered 2015-02-07: 20 mg via ORAL
  Filled 2015-02-07: qty 1

## 2015-02-07 MED ORDER — DILTIAZEM HCL 100 MG IV SOLR
2.5000 mg/h | INTRAVENOUS | Status: DC
  Administered 2015-02-07: 2.5 mg/h via INTRAVENOUS
  Administered 2015-02-08: 10 mg/h via INTRAVENOUS
  Administered 2015-02-08: 9 mg/h via INTRAVENOUS
  Administered 2015-02-09: 8 mg/h via INTRAVENOUS
  Filled 2015-02-07 (×5): qty 100

## 2015-02-07 MED ORDER — LISINOPRIL 20 MG PO TABS
20.0000 mg | ORAL_TABLET | Freq: Two times a day (BID) | ORAL | Status: DC
  Administered 2015-02-07: 20 mg via ORAL
  Filled 2015-02-07 (×4): qty 1

## 2015-02-07 NOTE — Progress Notes (Signed)
During pt's bath, pt appeared to convert from NSR to Afib with HR 110-140.  BP is 149/90.  Pt has no complaints presently.  MD (Elsner-Neurosurgery) notified.  Dr. Ellene Route to call and consult with CCM.  Will continue to monitor.

## 2015-02-07 NOTE — Progress Notes (Signed)
Rehab Admissions Coordinator Note:  Patient was screened by Cleatrice Burke for appropriateness for an Inpatient Acute Rehab Consult per PT and OT recommendations.  At this time, we are recommending LTACH.  Kiyani, Jernigan 02/07/2015, 6:53 PM  I can be reached at [phone].

## 2015-02-07 NOTE — Progress Notes (Signed)
  Echocardiogram 2D Echocardiogram has been performed.  [name] 02/07/2015, 9:44 AM

## 2015-02-07 NOTE — Evaluation (Signed)
Occupational Therapy Evaluation Patient Details Name: EARLY ORD MRN: [id number] DOB: 19-Jan-1935 Today's Date: 02/07/2015    History of Present Illness [name] is an 80 y.o. female with a history of congestive heart failure, COPD, breast cancer, transient atrial fibrillation and hypertension, presenting with acute onset of ataxia with vertigo, nausea and blurred vision at 6 AM this morning. She has been on aspirin 81 mg most days. He has not been on anticoagulation. CT scan of her head showed a large right hemispheric cerebellar hematoma measuring 4.0 x 3.6 mm. There was rounding edema with significant mass effect on the fourth ventricle. There were no signs of hydrocephalus. NIH stroke score was 4.   Clinical Impression   Pt admitted with above. She demonstrates the below listed deficits and will benefit from continued OT to maximize safety and independence with BADLs.  Pt demonstrates significant ataxia, diplopia and nystagmus, impaired balance, decreased awareness of deficits.   Currently, she requires mod - max A for ADLs and +2 mod A for limited functional mobility.  Recommend CIR.       Follow Up Recommendations  CIR;Supervision/Assistance - 24 hour    Equipment Recommendations  3 in 1 bedside comode;Wheelchair (measurements OT)    Recommendations for Other Services Rehab consult     Precautions / Restrictions Precautions Precautions: Fall      Mobility Bed Mobility Overal bed mobility: Needs Assistance Bed Mobility: Rolling;Sidelying to Sit;Sit to Supine Rolling: Min assist Sidelying to sit: Mod assist   Sit to supine: Mod assist   General bed mobility comments: pt unable to coordinate RUE to assist trunk coming up from R to L.  pt need truncal control to stabilize for scoot to EOB  Transfers Overall transfer level: Needs assistance Equipment used: 2 person hand held assist Transfers: Sit to/from Stand Sit to Stand: Mod assist;+2 physical  assistance         General transfer comment: 2 person assist for safety due to significant truncal ataxia    Balance Overall balance assessment: Needs assistance Sitting-balance support: Single extremity supported Sitting balance-Leahy Scale: Poor Sitting balance - Comments: doesn't coordinate challenge or w/shift without LOB   Standing balance support: Single extremity supported Standing balance-Leahy Scale: Zero Standing balance comment: significant truncal ataxia and R LE ataxia hindering pt from holding midline or controlling stance in midline to safely take steps in place or support pt in stance with one person assist without significant support.                            ADL Overall ADL's : Needs assistance/impaired Eating/Feeding: Minimal assistance;Bed level;Sitting   Grooming: Wash/dry hands;Wash/dry face;Oral care;Brushing hair;Minimal assistance;Sitting   Upper Body Bathing: Moderate assistance;Sitting   Lower Body Bathing: Maximal assistance;Sit to/from stand   Upper Body Dressing : Moderate assistance;Sitting   Lower Body Dressing: Total assistance;Sit to/from stand Lower Body Dressing Details (indicate cue type and reason): Pt unable to access bil. feet due to ataxia and dizziness Toilet Transfer: Total assistance Toilet Transfer Details (indicate cue type and reason): unable to perform safely this date  Toileting- Water quality scientist and Hygiene: Total assistance;Sit to/from stand       Functional mobility during ADLs: Moderate assistance;+2 for physical assistance       Vision Vision Assessment?: Yes Eye Alignment: Impaired (comment) Ocular Range of Motion: Restricted on the right Tracking/Visual Pursuits: Right eye does not track laterally;Decreased smoothness of horizontal tracking;Decreased smoothness of vertical  tracking;Decreased smoothness of eye movement to RIGHT superior field;Decreased smoothness of eye movement to LEFT superior  field;Decreased smoothness of eye movement to RIGHT inferior field;Decreased smoothness of eye movement to LEFT inferior field Convergence: Impaired (comment) Diplopia Assessment: Disappears with one eye closed;Objects split side to side Additional Comments: Pt with CN VI nerve palsy Rt eye. Pt provides inconsistent info re: frequency of diplopia.  Pt with horizontal and rotational nystagmus with all movements both eyes.  Visual assessment limited due to increas in dizziness    Perception Perception Perception Tested?: Yes   Praxis Praxis Praxis tested?: Within functional limits    Pertinent Vitals/Pain Pain Assessment: No/denies pain     Hand Dominance Right   Extremity/Trunk Assessment Upper Extremity Assessment Upper Extremity Assessment: RUE deficits/detail;LUE deficits/detail RUE Deficits / Details: Ataxic.  Full AROM  RUE Sensation: decreased proprioception RUE Coordination: decreased fine motor;decreased gross motor LUE Deficits / Details: dysmetria  LUE Sensation: decreased proprioception LUE Coordination: decreased gross motor;decreased fine motor   Lower Extremity Assessment Lower Extremity Assessment: Defer to PT evaluation RLE Deficits / Details: grossly >3+/5, ataxic and uncontrolled movement RLE Coordination: decreased fine motor LLE Deficits / Details: generally WFL   Cervical / Trunk Assessment Cervical / Trunk Assessment: Other exceptions Cervical / Trunk Exceptions: truncal ataxia    Communication Communication Communication: No difficulties   Cognition Arousal/Alertness: Awake/alert;Lethargic Behavior During Therapy: Flat affect Overall Cognitive Status: Impaired/Different from baseline Area of Impairment: Attention;Safety/judgement;Awareness   Current Attention Level: Sustained     Safety/Judgement: Decreased awareness of deficits Awareness: Intellectual       General Comments       Exercises       Shoulder Instructions      Home  Living Family/patient expects to be discharged to:: Private residence Living Arrangements: Spouse/significant other (and a son who has the more flexible schedule) Available Help at Discharge: Family Type of Home: House Home Access: Stairs to enter [employer] of Steps: 2 Entrance Stairs-Rails: None Home Layout: Two level;Able to live on main level with bedroom/bathroom Alternate Level Stairs-Number of Steps: flight Alternate Level Stairs-Rails: Right;Left Bathroom Shower/Tub: Occupational psychologist: Handicapped height Bathroom Accessibility: Yes How Accessible: Accessible via walker Home Equipment: [place] - 2 wheels;Bedside commode;Shower seat - built in      Lives With: Spouse    Prior Functioning/Environment Level of Independence: Independent        Comments: drove, ran errands    OT Diagnosis: Generalized weakness;Cognitive deficits;Disturbance of vision;Ataxia   OT Problem List: Decreased strength;Decreased range of motion;Impaired balance (sitting and/or standing);Decreased activity tolerance;Impaired vision/perception;Decreased coordination;Decreased cognition;Decreased safety awareness;Decreased knowledge of use of DME or AE;Impaired sensation;Impaired UE functional use   OT Treatment/Interventions: Self-care/ADL training;Neuromuscular education;DME and/or AE instruction;Therapeutic activities;Cognitive remediation/compensation;Visual/perceptual remediation/compensation;Patient/family education;Balance training    OT Goals(Current goals can be found in the care plan section) Acute Rehab OT Goals Patient Stated Goal: regain independence  OT Goal Formulation: With patient Time For Goal Achievement: 02/21/15 Potential to Achieve Goals: Good ADL Goals Pt Will Perform Eating: with modified independence;sitting Pt Will Perform Grooming: with supervision;sitting Pt Will Perform Upper Body Bathing: with supervision;sitting Pt Will Perform Lower Body  Bathing: with mod assist;sit to/from stand Pt Will Perform Upper Body Dressing: with supervision;sitting Pt Will Perform Lower Body Dressing: with mod assist;sit to/from stand Pt Will Transfer to Toilet: with mod assist;stand pivot transfer;bedside commode Pt Will Perform Toileting - Clothing Manipulation and hygiene: with mod assist;sit to/from stand Additional ADL Goal #1: Pt will be  independent with HEP for vision   OT Frequency: Min 3X/week   Barriers to D/C:            Co-evaluation PT/OT/SLP Co-Evaluation/Treatment: Yes Reason for Co-Treatment: Complexity of the patient's impairments (multi-system involvement) PT goals addressed during session: Mobility/safety with mobility OT goals addressed during session: ADL's and self-care      End of Session Nurse Communication: Mobility status  Activity Tolerance: Patient limited by fatigue Patient left: in bed;with call bell/phone within reach   Time: 8006-3494 OT Time Calculation (min): 25 min Charges:  OT General Charges $OT Visit: 1 Procedure OT Evaluation $Initial OT Evaluation Tier I: 1 Procedure G-Codes:    Lucille Passy M February 22, 2015, 4:36 PM

## 2015-02-07 NOTE — Consult Note (Signed)
PULMONARY / CRITICAL CARE MEDICINE   Name: [name] MRN: [id number] DOB: [birth year]    ADMISSION DATE:  02/05/2015 CONSULTATION DATE:  02/05/2015  REFERRING MD :  Dr. Kathyrn Sheriff  CHIEF COMPLAINT:  ICH, now AF-RVR  INITIAL PRESENTATION: 80 year old female admitted 6/4 with large R hemispheric cerebellar bleed, and was taken to OR for surgical evacuation that day.  Since that time she has been stable hemodynamically and neurologically. 6/6 PM she went into AF with RVR. PCCM consulted.  STUDIES:  6/4 CT head > 4.0 cm acute hemorrhage within the right cerebellar hemisphere with surrounding edema and mass effect on the fourth ventricle. There is no hydrocephalus. The estimated volume is 25-30 mL. Lacunar infarct of the right caudate head appears remote. 6/5 CT head > Although there has been debulking of the central aspect of the hematoma, right cerebellar hematoma remains spanning over 4.1 x 3.5 cm versus prior 4.4 x 3.5 cm. Surrounding vasogenic edema and mass effect have progressed slightly.  SIGNIFICANT EVENTS: 6/4 admitted for ICH, to OR for evacuation   HISTORY OF PRESENT ILLNESS:  80 year old female with PMH as below, which includes CHF, COPD, Breast Ca (s/p R mastectomy), Atrial fib (one episode in past, not an anticoag), and HTN. 6/4 she awoke with profound dizziness and nausea. Also with som blurry vision. She reported to ED for these complaints and was found to have large R hemispheric cerebellar bleed. She was taken to OR for evacuation of this. Since that time she has been relatively neuro intact and slowly improving. Somewhat complicated by vasogenic edema. She was on 3% saline. Never required intubation. Was hemodynamically stable as well until 6/6 PM when she developed AF with RVR. PCCM consulted for management.    PAST MEDICAL HISTORY :   has a past medical history of History of CHF (congestive heart failure); COPD (chronic obstructive pulmonary disease); History of breast  cancer; Glaucoma of both eyes; History of atrial fibrillation without current medication (2010); Arthritis; Dyspnea on exertion; GERD (gastroesophageal reflux disease); Nephrolithiasis; History of shingles; History of hypertension; and Recurrent bladder papillary carcinoma (first dx 06/ 2014).  has past surgical history that includes Total knee arthroplasty (Right, 03-22-2009); Partial mastectomy with needle localization (Right, 01-14-2003); Total mastectomy (Right, 02-03-2003); REMOVAL CYST LEFT HAND (2013); transthoracic echocardiogram (03-25-2009); Transurethral resection of bladder tumor with gyrus (turbt-gyrus) (N/A, 03/26/2013); Cystoscopy w/ ureteral stent placement (Bilateral, 03/26/2013); Transurethral resection of bladder tumor with gyrus (turbt-gyrus) (N/A, 05/13/2013); Cystoscopy w/ ureteral stent placement (Left, 05/13/2013); Tubal ligation; Cataract extraction w/ intraocular lens  implant, bilateral; Transurethral resection of bladder tumor with gyrus (turbt-gyrus) (N/A, 08/11/2014); Cystoscopy w/ retrogrades (Bilateral, 08/11/2014); and Craniectomy (N/A, 02/05/2015). Prior to Admission medications   Medication Sig Start Date End Date Taking? Authorizing Provider  aspirin 81 MG tablet Take 81 mg by mouth daily.   Yes Historical Provider, MD  latanoprost (XALATAN) 0.005 % ophthalmic solution Place 1 drop into both eyes at bedtime.   Yes Historical Provider, MD  levalbuterol [facility] HFA) 45 MCG/ACT inhaler Inhale 1-2 puffs into the lungs every 4 (four) hours as needed for wheezing.   Yes Historical Provider, MD  pantoprazole (PROTONIX) 40 MG tablet Take 40 mg by mouth daily. 12/03/14  Yes Historical Provider, MD  SIMBRINZA 1-0.2 % SUSP Place 1 drop into both eyes 2 (two) times daily.  12/23/14  Yes Historical Provider, MD  amoxicillin (AMOXIL) 500 MG capsule Prior to dentist appointment 12/29/13   Historical Provider, MD   Allergies  Allergen Reactions  . Morphine And Related Nausea And Vomiting     FAMILY HISTORY:  has no family status information on file.  SOCIAL HISTORY:  reports that she quit smoking about 11 years ago. Her smoking use included Cigarettes. She has a 40 pack-year smoking history. She has never used smokeless tobacco. She reports that she does not drink alcohol or use illicit drugs.  REVIEW OF SYSTEMS:  Bolds are positive  Constitutional: weight loss, gain, night sweats, Fevers, chills, fatigue.  HEENT: headaches, Sore throat, sneezing, nasal congestion, post nasal drip, Difficulty swallowing, Tooth/dental problems, visual complaints visual changes, ear ache CV:  chest pain, radiates: ,Orthopnea, PND, swelling in lower extremities, dizziness, palpitations, syncope.  GI  heartburn, indigestion, abdominal pain, nausea, vomiting, diarrhea, change in bowel habits, loss of appetite, bloody stools.  Resp: cough, productive: , hemoptysis, dyspnea, chest pain, pleuritic.  Skin: rash or itching or icterus GU: dysuria, change in color of urine, urgency or frequency. flank pain, hematuria  MS: joint pain or swelling. decreased range of motion  Psych: change in mood or affect. depression or anxiety.  Neuro: difficulty with speech, weakness, numbness, ataxia    SUBJECTIVE:   VITAL SIGNS: Temp:  [97.5 F (36.4 C)-98.6 F (37 C)] 98.6 F (37 C) (06/06 2000) Pulse Rate:  [42-131] 131 (06/06 2147) Resp:  [15-29] 19 (06/06 2147) BP: (122-172)/(47-90) 149/90 mmHg (06/06 2147) SpO2:  [95 %-100 %] 97 % (06/06 2100) Weight:  [78.1 kg (172 lb 2.9 oz)] 78.1 kg (172 lb 2.9 oz) (06/06 0400) HEMODYNAMICS:   VENTILATOR SETTINGS:   INTAKE / OUTPUT:  Intake/Output Summary (Last 24 hours) at 02/07/15 2217 Last data filed at 02/07/15 2100  Gross per 24 hour  Intake 1715.83 ml  Output   3820 ml  Net -2104.17 ml    PHYSICAL EXAMINATION: General:  Elderly female in NAD Neuro:  Alert, oriented, mildly dysarthric. PERRL, horizontal nystagmus noted with L and R side  gaze HEENT:  [place]/AT, no JVD Cardiovascular:  irreg irreg, tachy, no MRG Lungs:  Clear bilateral breath sounds Abdomen:  Soft, non-tender, non-distended Musculoskeletal:  No acute deformity Skin:  Grossly intact  LABS:  CBC  Recent Labs Lab 02/05/15 1016 02/05/15 1508 02/06/15 0934 02/07/15 0550  WBC 12.4*  --  13.8* 12.2*  HGB 14.8 12.9 12.0 11.4*  HCT 44.9 38.0 36.4 34.7*  PLT 213  --  207 183   Coag's  Recent Labs Lab 02/05/15 1016  APTT 30  INR 1.06   BMET  Recent Labs Lab 02/05/15 1016 02/05/15 1508 02/06/15 0934 02/07/15 0550 02/07/15 0950 02/07/15 1500 02/07/15 2104  NA 138 138 138 137 138 142 143  K 3.6 3.8 3.9 4.0  --   --   --   CL 101  --  103 99*  --   --   --   CO2 28  --  27 29  --   --   --   BUN 16  --  10 10  --   --   --   CREATININE 0.63  --  0.53 0.53  --   --   --   GLUCOSE 174* 163* 124* 115*  --   --   --    Electrolytes  Recent Labs Lab 02/05/15 1016 02/06/15 0934 02/07/15 0550  CALCIUM 9.0 8.4* 8.4*   Sepsis Markers No results for input(s): LATICACIDVEN, PROCALCITON, O2SATVEN in the last 168 hours. ABG No results for input(s): PHART, PCO2ART, PO2ART in the last  168 hours. Liver Enzymes  Recent Labs Lab 02/05/15 1016  AST 25  ALT 21  ALKPHOS 115  BILITOT 0.6  ALBUMIN 4.2   Cardiac Enzymes No results for input(s): TROPONINI, PROBNP in the last 168 hours. Glucose  Recent Labs Lab 02/05/15 1031  GLUCAP 145*    Imaging No results found.   ASSESSMENT / PLAN:  PULMONARY A: Reported COPD history without acute exacerbation (on no maintenance medications)  P:   Supplemental O2 PRN PRN xopenex Monitor   CARDIOVASCULAR CVL [facility] 4/4 > A:  Atrial fibrillation with RVR acute onset EKG with ST depression in anterolateral leads H/o Chronic CHF, HTN  P:  Telemetry monitoring EKG Diltiazem infusion, bolus now Cycle troponin Check BMP, Mag Repeat EKG in AM SBP goal 90-160 per neurosurgery   RENAL A:    Hypokalemia  P:   Follow Bmet  GASTROINTESTINAL A:   GERD  P:   PO protonix for SUP Diet   HEMATOLOGIC A:   Mild anemia  P:  CBC Sub Q heparin per NSG  INFECTIOUS A:   Leukocytosis - suspect 2nd to steroids  P:   Monitor off ABX Follow WBC and fever curve  ENDOCRINE A:   No acute issues  P:   Monitor for steroid induced hyperglycemia  NEUROLOGIC A:   S/p evacuation for Cerebellar ICH Vasogenic edema  P:   Stroke team and neurosurgery primary On Decadron Hypertonic saline  CC time 35 mins  Georgann Housekeeper, AGACNP-BC [facility] Pulmonology/Critical Care Pager [phone] or [phone]  02/07/2015 11:12 PM

## 2015-02-07 NOTE — Progress Notes (Signed)
STROKE TEAM PROGRESS NOTE   HISTORY [name] is an 80 y.o. female with a history of congestive heart failure, COPD, breast cancer, transient atrial fibrillation and hypertension, presenting with acute onset of ataxia with vertigo, nausea and blurred vision at 6 AM this morning. She has been on aspirin 81 mg most days. She has not been on anticoagulation. CT scan of her head showed a large right hemispheric cerebellar hematoma measuring 4.0 x 3.6 mm. There was rounding edema with significant mass effect on the fourth ventricle. There were no signs of hydrocephalus. NIH stroke score was 4.  LSN: 6 AM on 02/05/2015 tPA Given: No: Acute cerebellar hemorrhage mRankin:  SUBJECTIVE (INTERVAL HISTORY) Her daughter is at the bedside.  Overall she feels her condition is stable. No headache, but still has double vision. Repeat CT head showed s/p evacuation but still has edema. Will start 3% saline. BP elevated overnight, will put on BP meds.  OBJECTIVE Temp:  [97.5 F (36.4 C)-98.6 F (37 C)] 98.4 F (36.9 C) (06/06 1140) Pulse Rate:  [57-90] 68 (06/06 1200) Cardiac Rhythm:  [-] Normal sinus rhythm (06/06 0800) Resp:  [14-29] 19 (06/06 1200) BP: (131-176)/(59-98) 161/74 mmHg (06/06 1200) SpO2:  [92 %-100 %] 99 % (06/06 1200) Weight:  [172 lb 2.9 oz (78.1 kg)] 172 lb 2.9 oz (78.1 kg) (06/06 0400)   Recent Labs Lab 02/05/15 1031  GLUCAP 145*    Recent Labs Lab 02/05/15 1016 02/05/15 1508 02/06/15 0934 02/07/15 0550 02/07/15 0950  NA 138 138 138 137 138  K 3.6 3.8 3.9 4.0  --   CL 101  --  103 99*  --   CO2 28  --  27 29  --   GLUCOSE 174* 163* 124* 115*  --   BUN 16  --  10 10  --   CREATININE 0.63  --  0.53 0.53  --   CALCIUM 9.0  --  8.4* 8.4*  --     Recent Labs Lab 02/05/15 1016  AST 25  ALT 21  ALKPHOS 115  BILITOT 0.6  PROT 7.0  ALBUMIN 4.2    Recent Labs Lab 02/05/15 1016 02/05/15 1508 02/06/15 0934 02/07/15 0550  WBC 12.4*  --  13.8* 12.2*   NEUTROABS 9.7*  --   --   --   HGB 14.8 12.9 12.0 11.4*  HCT 44.9 38.0 36.4 34.7*  MCV 93.9  --  92.9 94.0  PLT 213  --  207 183   No results for input(s): CKTOTAL, CKMB, CKMBINDEX, TROPONINI in the last 168 hours.  Recent Labs  02/05/15 1016  LABPROT 14.0  INR 1.06    Recent Labs  02/05/15 1210  COLORURINE YELLOW  LABSPEC 1.015  PHURINE 6.5  GLUCOSEU NEGATIVE  HGBUR NEGATIVE  BILIRUBINUR NEGATIVE  KETONESUR NEGATIVE  PROTEINUR NEGATIVE  UROBILINOGEN 0.2  NITRITE NEGATIVE  LEUKOCYTESUR TRACE*       Component Value Date/Time   CHOL 175 02/06/2015 0933   TRIG 117 02/06/2015 0933   HDL 51 02/06/2015 0933   CHOLHDL 3.4 02/06/2015 0933   VLDL 23 02/06/2015 0933   LDLCALC 101* 02/06/2015 0933   Lab Results  Component Value Date   HGBA1C 6.2* 02/06/2015   No results found for: LABOPIA, COCAINSCRNUR, LABBENZ, AMPHETMU, THCU, LABBARB  No results for input(s): ETH in the last 168 hours.  Ct Head Wo Contrast 02/05/2015    1) 4.0 cm acute hemorrhage within the right cerebellar hemisphere with surrounding edema and mass  effect on the fourth ventricle. There is no hydrocephalus. The estimated volume is 25-30 mL. 2) Lacunar infarct of the right caudate head appears remote.  3) No acute supratentorial abnormality.    Ct Head Wo Contrast 02/06/2015    IMPRESSION: Post right occipital craniotomy for drainage of right cerebellar hematoma. Although there has been debulking of the central aspect of the hematoma, right cerebellar hematoma remains spanning over 4.1 x 3.5 cm versus prior 4.4 x 3.5 cm.  Surrounding vasogenic edema and mass effect have progressed slightly. Compression of the fourth ventricle has progressed since the prior exam. Right cerebellar tonsil extension into the foramen magnum and upward herniation of the right cerebellum. Compression of the right aspect of the quadrigeminal plate has progressed slightly. Ventricular size including asymmetric mild prominence of the  left temporal horn relatively similar to prior exam. Patient is at risk for development of hydrocephalus. Close follow-up recommended.     Dg Chest Portable 1 View 02/05/2015    No acute cardiopulmonary disease.   2D echo - pending  PHYSICAL EXAM  Temp:  [97.5 F (36.4 C)-98.6 F (37 C)] 98.4 F (36.9 C) (06/06 1140) Pulse Rate:  [57-90] 68 (06/06 1200) Resp:  [14-29] 19 (06/06 1200) BP: (131-176)/(59-98) 161/74 mmHg (06/06 1200) SpO2:  [92 %-100 %] 99 % (06/06 1200) Weight:  [172 lb 2.9 oz (78.1 kg)] 172 lb 2.9 oz (78.1 kg) (06/06 0400)  General - Well nourished, well developed, in no apparent distress.  Ophthalmologic - Fundi not visualized due to eye movement and nystagmus.  Cardiovascular - Regular rate and rhythm.  Mental Status -  Level of arousal and orientation to time, place, and person were intact. Language including expression, naming, repetition, comprehension was assessed and found intact. Fund of Knowledge was assessed and was intact.  Cranial Nerves II - XII - II - Visual field intact OU. III, IV, VI - Extraocular movements showed mild right gaze difficulty with mild right CN VI palsy. V - Facial sensation intact bilaterally. VII - Facial movement intact bilaterally, but right nasolabial fold flattening. VIII - Hearing & vestibular intact bilaterally, horizontal nystagmus on right side gaze, rotational nystagmus on upward and downward gaze. X - Palate elevates symmetrically. XI - Chin turning & shoulder shrug intact bilaterally. XII - Tongue protrusion intact.  Motor Strength - The patient's strength was normal in all extremities and pronator drift was absent.  Bulk was normal and fasciculations were absent.   Motor Tone - Muscle tone was assessed at the neck and appendages and was normal.  Reflexes - The patient's reflexes were 1+ in all extremities and she had no pathological reflexes.  Sensory - Light touch, temperature/pinprick were assessed and were  symmetrical.    Coordination - The patient had ataxia on the right FTN and HTS, intact on the left.  Tremor was absent.  Gait and Station - not tested due to safety concerns.   ASSESSMENT/PLAN [name] is a 80 y.o. female with history of COPD, breast cancer on remission, bladder cancer s/p chemotherapy and surgery, paroxysmal A. fib on aspirin and the CHF presenting with ataxia, vertigo and nausea vomiting. CT showed right cerebellar ICH. She received right cerebellar hematoma evacuation.     Right cerebellar ICH:  S/p hematoma evacuation. Etiology not clear, HTN, vs. CAA vs. AVM vs. Brain mets. Patient blood pressure well controlled at home and after admission, malignancy also in remission. Will need follow-up MRI, however patient reluctant to do any MRI.  Resultant  diplopia, nystagmus, ataxia  CT showed right cerebellar hemorrhage  Repeat CT showed slight smaller size of hematoma - need close watch for hydrocephalus  2D Echo  pending  LDL 101  HgbA1c 6.2  Heparin subcutaneous for VTE prophylaxis Diet clear liquid Room service appropriate?: Yes; Fluid consistency:: Thin  aspirin 81 mg orally every day prior to admission, now on no antithrombotic  Ongoing aggressive stroke risk factor management  Therapy recommendations:  Pending  Disposition:  Pending  Cerebellar edema  Due to cerebellar hematoma  Put on 3% saline   Goal Na 150-160  Na Q6  Close watch for hydrocephalus  Hypertension  BP elevated overnight to 160s  Pt denies hx of HTN  May related to ? High ICP ?  Put on lisinopril  Hyperlipidemia  LDL 101, goal < 70  Add Lipitor 10  Continue statin at discharge  Paroxysmal A. Fib  Patient stated that she had a one time A. fib when she was admitted for knee surgery complicated with pneumonia. Not sure if had further A. fib episode since then  No palpitation at home  On aspirin 81 at home  Patient is likely not anticoagulation  candidate due to spontaneous cerebellar hemorrhage.   Other Stroke Risk Factors  Advanced age  Former smoker for 40 years  Breast cancer in remission  Bladder cancer were treated last December  CHF  Other Active Problems  Leukocytosis  Hospital day # 2  This patient is critically ill due to right cerebellar ICH s/p evacuation and at significant risk of neurological worsening, death form cerebellar edema, transtentorial herniation, obstructive hydrocephalus and respiratory failure. This patient's care requires constant monitoring of vital signs, hemodynamics, respiratory and cardiac monitoring, review of multiple databases, neurological assessment, discussion with family, other specialists and medical decision making of high complexity. I spent 40 minutes of neurocritical care time in the care of this patient.  Rosalin Hawking, MD PhD Stroke Neurology 02/07/2015 12:47 PM   To contact Stroke Continuity provider, please refer to [email or web address] After hours, contact General Neurology

## 2015-02-07 NOTE — Evaluation (Signed)
Physical Therapy Evaluation Patient Details Name: [name] MRN: [id number] DOB: [birth year] Today's Date: 02/07/2015   History of Present Illness  [name] is an 80 y.o. female with a history of congestive heart failure, COPD, breast cancer, transient atrial fibrillation and hypertension, presenting with acute onset of ataxia with vertigo, nausea and blurred vision at 6 AM this morning. She has been on aspirin 81 mg most days. He has not been on anticoagulation. CT scan of her head showed a large right hemispheric cerebellar hematoma measuring 4.0 x 3.6 mm. There was rounding edema with significant mass effect on the fourth ventricle. There were no signs of hydrocephalus. NIH stroke score was 4.  Clinical Impression  Pt admitted with/for Cerebellar hemorrhage.  Pt currently limited functionally due to the problems listed. ( See problems list.)   Pt will benefit from PT to maximize function and safety in order to get ready for next venue listed below.     Follow Up Recommendations CIR    Equipment Recommendations  Other (comment) (TBA)    Recommendations for Other Services Rehab consult     Precautions / Restrictions Precautions Precautions: Fall      Mobility  Bed Mobility Overal bed mobility: Needs Assistance Bed Mobility: Rolling;Sidelying to Sit;Sit to Supine Rolling: Min assist Sidelying to sit: Mod assist   Sit to supine: Mod assist   General bed mobility comments: pt unable to coordinate RUE to assist trunk coming up from R to L.  pt need truncal control to stabilize for scoot to EOB  Transfers Overall transfer level: Needs assistance   Transfers: Sit to/from Stand Sit to Stand: Mod assist;+2 physical assistance         General transfer comment: 2 person assist for safety due to significant truncal ataxia  Ambulation/Gait             General Gait Details: not tested  Stairs            Wheelchair Mobility    Modified Rankin  (Stroke Patients Only) Modified Rankin (Stroke Patients Only) Pre-Morbid Rankin Score: No symptoms Modified Rankin: Severe disability     Balance Overall balance assessment: Needs assistance Sitting-balance support: Single extremity supported;Bilateral upper extremity supported;Feet supported Sitting balance-Leahy Scale: Poor Sitting balance - Comments: doesn't coordinate challenge or w/shift without LOB   Standing balance support: Single extremity supported;Bilateral upper extremity supported;During functional activity Standing balance-Leahy Scale: Zero Standing balance comment: significant truncal ataxia and R LE ataxia hindering pt from holding midline or controlling stance in midline to safely take steps in place or support pt in stance with one person assist without significant support.                             Pertinent Vitals/Pain Pain Assessment: No/denies pain    Home Living Family/patient expects to be discharged to:: Private residence Living Arrangements: Spouse/significant other (and a son who has the more flexible schedule) Available Help at Discharge: Family Type of Home: House Home Access: Stairs to enter Entrance Stairs-Rails: None Entrance Stairs-Number of Steps: 2 Home Layout: Two level;Able to live on main level with bedroom/bathroom Home Equipment: Gilford Rile - 2 wheels;Bedside commode;Shower seat - built in      Prior Function Level of Independence: Independent         Comments: drove, ran errands     Hand Dominance   Dominant Hand: Right    Extremity/Trunk Assessment   Upper Extremity  Assessment: Defer to OT evaluation           Lower Extremity Assessment: Generalized weakness;RLE deficits/detail;LLE deficits/detail RLE Deficits / Details: grossly >3+/5, ataxic and uncontrolled movement LLE Deficits / Details: generally WFL  Cervical / Trunk Assessment:  (truncal ataxia)  Communication   Communication: No difficulties   Cognition Arousal/Alertness: Awake/alert;Lethargic Behavior During Therapy: WFL for tasks assessed/performed;Flat affect Overall Cognitive Status: Within Functional Limits for tasks assessed                      General Comments General comments (skin integrity, edema, etc.): See OT eval for vision, but vertical and horizontal pursuits produced rotational or horizontal nystagmus,  6th nerve palsey reducing scan right    Exercises        Assessment/Plan    PT Assessment Patient needs continued PT services  PT Diagnosis Generalized weakness;Difficulty walking (hemiataxia)   PT Problem List Decreased strength;Decreased activity tolerance;Decreased balance;Decreased mobility;Decreased coordination;Decreased knowledge of use of DME;Decreased knowledge of precautions  PT Treatment Interventions DME instruction;Gait training;Functional mobility training;Therapeutic activities;Balance training;Neuromuscular re-education;Patient/family education   PT Goals (Current goals can be found in the Care Plan section) Acute Rehab PT Goals Patient Stated Goal: Back Independent PT Goal Formulation: With patient Time For Goal Achievement: 02/21/15 Potential to Achieve Goals: Good    Frequency Min 4X/week   Barriers to discharge        Co-evaluation PT/OT/SLP Co-Evaluation/Treatment: Yes Reason for Co-Treatment: Complexity of the patient's impairments (multi-system involvement) PT goals addressed during session: Mobility/safety with mobility         End of Session   Activity Tolerance: Patient tolerated treatment well;Patient limited by fatigue Patient left: in bed;with call bell/phone within reach Nurse Communication: Mobility status;Precautions         Time: 2876-8115 PT Time Calculation (min) (ACUTE ONLY): 25 min   Charges:   PT Evaluation $Initial PT Evaluation Tier I: 1 Procedure     PT G Codes:        Brinkley Peet, Tessie Fass 02/07/2015, 4:18 PM 02/07/2015  Donnella Sham, [place] [phone]  (pager)

## 2015-02-07 NOTE — Care Management Note (Signed)
Case Management Note  Patient Details  Name: CRISTIAN GRIEVES MRN: [id number] Date of Birth: 1935/09/03  Subjective/Objective:     Pt adm on 02/05/15 with cerebellar hemorrhage requiring craniotomy.  PTA, pt resided at home with spouse.              Action/Plan: Will follow for discharge planning as pt progresses.  PT/OT recommending CIR.    Expected Discharge Date:                  Expected Discharge Plan:  IP Rehab Facility  In-House Referral:  Clinical Social Work  Discharge planning Services  CM Consult  Post Acute Care Choice:    Choice offered to:     DME Arranged:    DME Agency:     HH Arranged:    [place] Agency:     Status of Service:  In process, will continue to follow  Medicare Important Message Given:    Date Medicare IM Given:    Medicare IM give by:    Date Additional Medicare IM Given:    Additional Medicare Important Message give by:     If discussed at [place] of Stay Meetings, dates discussed:    Additional Comments:  Reinaldo Raddle, RN, BSN  Trauma/Neuro ICU Case Manager [phone]

## 2015-02-07 NOTE — Progress Notes (Signed)
Pt seen and examined. No issues overnight. Pt reports some improvement in coordination of her right arm. No significant complaints.  EXAM: Temp:  [97.5 F (36.4 C)-98.6 F (37 C)] 98.2 F (36.8 C) (06/06 0809) Pulse Rate:  [57-90] 57 (06/06 0700) Resp:  [14-29] 17 (06/06 0700) BP: (131-176)/(59-98) 147/59 mmHg (06/06 0700) SpO2:  [92 %-100 %] 97 % (06/06 0700) Arterial Line BP: (157)/(81) 157/81 mmHg (06/05 0900) Weight:  [78.1 kg (172 lb 2.9 oz)] 78.1 kg (172 lb 2.9 oz) (06/06 0400) Intake/Output      06/05 0701 - 06/06 0700 06/06 0701 - 06/07 0700   P.O. 600    I.V. (mL/kg) 1800 (23)    IV Piggyback     Total Intake(mL/kg) 2400 (30.7)    Urine (mL/kg/hr) 1325 (0.7) 175 (1.4)   Blood     Total Output 1325 175   Net +1075 -175         Awake, alert, oriented Speech slightly dysarthric CN grossly intact with nystagmus Moving all extremities with good strength  LABS: Lab Results  Component Value Date   CREATININE 0.53 02/07/2015   BUN 10 02/07/2015   NA 137 02/07/2015   K 4.0 02/07/2015   CL 99* 02/07/2015   CO2 29 02/07/2015   Lab Results  Component Value Date   WBC 12.2* 02/07/2015   HGB 11.4* 02/07/2015   HCT 34.7* 02/07/2015   MCV 94.0 02/07/2015   PLT 183 02/07/2015    IMAGING: Repeat CTH yesterday demonstrates reaccumulation of right cerebellar hematoma with surrounding edema, and persistent effacement of the 4th ventricle. No HCP.  IMPRESSION: - 80 y.o. female with reaccumulation of right cerebellar lobar hematoma. Currently doing well neurologically.  PLAN: - Will cont to observe in ICU closely. - Start decadron for any postoperative swelling, and will start hypertonic saline for perilesional edema. - Repeat CTH this pm.  I have discussed the situation with the patient, her daughter, and their close family friend who is a stroke neurologist at [facility]. Options for management including re-evacuation vs more conservative expectant mgmt were reviewed. We  have elected to observe for now. The possible need for EVD was also reviewed. All questions were answered.

## 2015-02-07 NOTE — Evaluation (Signed)
Clinical/Bedside Swallow Evaluation Patient Details  Name: [name] MRN: [id number] Date of Birth: 1935/01/22  Today's Date: 02/07/2015 Time: SLP Start Time (ACUTE ONLY): 7262 SLP Stop Time (ACUTE ONLY): 1620 SLP Time Calculation (min) (ACUTE ONLY): 10 min  Past Medical History:  Past Medical History  Diagnosis Date  . History of CHF (congestive heart failure)     POST SURG 2010  . COPD (chronic obstructive pulmonary disease)   . History of breast cancer     DX DCIS IN 2004--  S/P RIGHT MASTECTOMY , NO CHEMORADIATION---  NO RECURRENCE  . Glaucoma of both eyes   . History of atrial fibrillation without current medication 2010    POST SURG 2010--  PER PT NO ISSUES SINCE  . Arthritis   . Dyspnea on exertion   . GERD (gastroesophageal reflux disease)   . Nephrolithiasis   . History of shingles     02/ 2015--  back of neck and left flank--  no residual pain  . History of hypertension   . Recurrent bladder papillary carcinoma first dx 06/ 2014    s/p  turbt's  and instillation mitomycin c (chemo)   Past Surgical History:  Past Surgical History  Procedure Laterality Date  . Total knee arthroplasty Right 03-22-2009  . Partial mastectomy with needle localization Right 01-14-2003    DCIS  . Total mastectomy Right 02-03-2003    W/ SLN BX  AND  POST 03-01-2003 EVACUATION HEMATOMA  . Removal cyst left hand  2013  . Transthoracic echocardiogram  03-25-2009    MILD LVF/ EF 70-80%/ MILD INCREASE SYSTOLIC PULMONARY  PRESSURE  . Transurethral resection of bladder tumor with gyrus (turbt-gyrus) N/A 03/26/2013    Procedure: TRANSURETHRAL RESECTION OF BLADDER TUMOR WITH GYRUS (TURBT-GYRUS);  Surgeon: Alexis Frock, MD;  Location: [facility];  Service: Urology;  Laterality: N/A;  . Cystoscopy w/ ureteral stent placement Bilateral 03/26/2013    Procedure: CYSTOSCOPY WITH BILATERAL RETROGRADE PYELOGRAM/URETERAL STENT PLACEMENT;  Surgeon: Alexis Frock, MD;  Location: [facility];  Service: Urology;  Laterality: Bilateral;  . Transurethral resection of bladder tumor with gyrus (turbt-gyrus) N/A 05/13/2013    Procedure: TRANSURETHRAL RESECTION OF BLADDER TUMOR WITH GYRUS (TURBT-GYRUS)  RE-STAGING TRANSURETHRAL RESECTION OF BLADDER TUMOR, LEFT RETROGRADE PYELOGRAM AND STENT EXCHANGE;  Surgeon: Alexis Frock, MD;  Location: [facility];  Service: Urology;  Laterality: N/A;  . Cystoscopy w/ ureteral stent placement Left 05/13/2013    Procedure: CYSTOSCOPY WITH RETROGRADE PYELOGRAM/URETERAL STENT PLACEMENT STENT EXCHANGE;  Surgeon: Alexis Frock, MD;  Location: [facility];  Service: Urology;  Laterality: Left;  . Tubal ligation    . Cataract extraction w/ intraocular lens  implant, bilateral    . Transurethral resection of bladder tumor with gyrus (turbt-gyrus) N/A 08/11/2014    Procedure: TRANSURETHRAL RESECTION OF BLADDER TUMOR WITH GYRUS (TURBT-GYRUS);  Surgeon: Alexis Frock, MD;  Location: [facility];  Service: Urology;  Laterality: N/A;  . Cystoscopy w/ retrogrades Bilateral 08/11/2014    Procedure: CYSTOSCOPY WITH BILATERAL RETROGRADE PYELOGRAM AND MITOMYCIN INSTILLATION;  Surgeon: Alexis Frock, MD;  Location[facility];  Service: Urology;  Laterality: Bilateral;  . Craniectomy N/A 02/05/2015    Procedure: SUBOCCIPITAL CRANIECTOMY EVACUATION OF HEMATOMA;  Surgeon: Consuella Lose, MD;  Location: [facility];  Service: Neurosurgery;  Laterality: N/A;   HPI:  [name] is an 80 y.o. female with a history of congestive heart failure, COPD, breast cancer, transient atrial fibrillation and  hypertension, presenting with acute onset of ataxia with vertigo, nausea and blurred vision.  CT scan of her head showed a large right hemispheric cerebellar hematoma measuring 4.0 x 3.6 mm. Note that patient failed RN stroke swallow screen initially. Screen was repeated inappropriately and patient  passed and has been on a regular diet without difficulty per RN. Bedside SLP evaluation ordered per protocol. Note barium swallow complete in 2013 indicates moderate GER and prominent CP.    Assessment / Plan / Recommendation Clinical Impression  Oropharyngeal swallow appears intact despite mild right sided labial weakness. Patient appears to be fully protecting airway at this time. Unable to assess patient with regular texture solids due to current dietary restrictions however do not anticipate difficulty once diet advanced based on current findings and results of RN stroke swallow screen. RN educated on need to monitor for pocketing due to right sided weakness and re-consult SLP for swallow evaluation if difficulty noted. Otherwise, SLP will f/u for language treatment only.     Aspiration Risk  Mild    Diet Recommendation Age appropriate regular solids;Thin   Medication Administration: Whole meds with liquid Compensations: Slow rate;Small sips/bites;Check for pocketing    Other  Recommendations Oral Care Recommendations: Oral care BID      Frequency and Duration min 1 x/week (for written communication)  2 weeks   Pertinent Vitals/Pain n/a        Swallow Study Prior Functional Status  Type of Home: House  Lives With: Spouse Available Help at Discharge: Family    General Other Pertinent Information: [name] is an 80 y.o. female with a history of congestive heart failure, COPD, breast cancer, transient atrial fibrillation and hypertension, presenting with acute onset of ataxia with vertigo, nausea and blurred vision.  CT scan of her head showed a large right hemispheric cerebellar hematoma measuring 4.0 x 3.6 mm. Note that patient failed RN stroke swallow screen initially. Screen was repeated inappropriately and patient passed and has been on a regular diet without difficulty per RN. Bedside SLP evaluation ordered per protocol. Note barium swallow complete in 2013 indicates  moderate GER and prominent CP.  Type of Study: Bedside swallow evaluation Previous Swallow Assessment: see HPI Diet Prior to this Study: Thin liquids (clear liquids) Temperature Spikes Noted: No Respiratory Status: Supplemental O2 delivered via (comment) (nasal cannula) History of Recent Intubation: No Behavior/Cognition: Cooperative;Pleasant mood;Lethargic/Drowsy Oral Cavity - Dentition: Adequate natural dentition/normal for age Self-Feeding Abilities: Able to feed self Patient Positioning: Upright in bed Baseline Vocal Quality: Normal Volitional Cough: Strong Volitional Swallow: Able to elicit    Oral/Motor/Sensory Function Overall Oral Motor/Sensory Function: Impaired Labial ROM: Reduced right Labial Symmetry: Abnormal symmetry right Labial Strength: Within Functional Limits Labial Sensation: Within Functional Limits Lingual ROM: Within Functional Limits Lingual Symmetry: Within Functional Limits Lingual Strength: Within Functional Limits Lingual Sensation: Within Functional Limits Facial ROM: Within Functional Limits Facial Symmetry: Within Functional Limits Facial Strength: Within Functional Limits Facial Sensation: Within Functional Limits Velum: Within Functional Limits Mandible: Within Functional Limits   Ice Chips Ice chips: Not tested   Thin Liquid Thin Liquid: Within functional limits Presentation: Self Fed;Straw    Nectar Thick Nectar Thick Liquid: Not tested   Honey Thick Honey Thick Liquid: Not tested   Puree Puree: Within functional limits Presentation: Spoon   Solid   GO   Gabriel Rainwater MA, CCC-SLP [phone]  Solid: Not tested       Abbygale Lapid Meryl 02/07/2015,4:24 PM

## 2015-02-07 NOTE — Progress Notes (Signed)
Pt's EKG performed and faxed to [place].  Pt's BP stable but she remains in Afib with HR 110s-130s.  Pt continues to have no complaints.  Will continue to monitor.

## 2015-02-07 NOTE — Progress Notes (Signed)
[place] Progress Note Patient Name: [name] DOB: 02/14/1935 MRN: [id number]   Date of Service  02/07/2015  HPI/Events of Note  AFIB with ventricular rate 120's to 130's. BP = 136/84.  eICU Interventions  Will order: 1. 12 Lead EKG now. 2. Troponin now and Q 6 hours x 2 (total 3 sets) 3. BMP and Magnesium level now.  4. Cardizem IV infusion.      Intervention Category Major Interventions: Arrhythmia - evaluation and management  Sommer,Steven Eugene 02/07/2015, 10:02 PM

## 2015-02-08 ENCOUNTER — Inpatient Hospital Stay (HOSPITAL_COMMUNITY): Payer: Medicare Other

## 2015-02-08 ENCOUNTER — Encounter (HOSPITAL_COMMUNITY): Payer: Self-pay

## 2015-02-08 DIAGNOSIS — G911 Obstructive hydrocephalus: Secondary | ICD-10-CM | POA: Insufficient documentation

## 2015-02-08 DIAGNOSIS — I480 Paroxysmal atrial fibrillation: Secondary | ICD-10-CM

## 2015-02-08 DIAGNOSIS — I4891 Unspecified atrial fibrillation: Secondary | ICD-10-CM | POA: Insufficient documentation

## 2015-02-08 DIAGNOSIS — I5042 Chronic combined systolic (congestive) and diastolic (congestive) heart failure: Secondary | ICD-10-CM | POA: Insufficient documentation

## 2015-02-08 DIAGNOSIS — I614 Nontraumatic intracerebral hemorrhage in cerebellum: Secondary | ICD-10-CM | POA: Insufficient documentation

## 2015-02-08 DIAGNOSIS — E876 Hypokalemia: Secondary | ICD-10-CM | POA: Insufficient documentation

## 2015-02-08 DIAGNOSIS — E785 Hyperlipidemia, unspecified: Secondary | ICD-10-CM | POA: Insufficient documentation

## 2015-02-08 LAB — CBC
HEMATOCRIT: 37.8 % (ref 36.0–46.0)
HEMOGLOBIN: 12 g/dL (ref 12.0–15.0)
MCH: 30.2 pg (ref 26.0–34.0)
MCHC: 31.7 g/dL (ref 30.0–36.0)
MCV: 95 fL (ref 78.0–100.0)
Platelets: 199 10*3/uL (ref 150–400)
RBC: 3.98 MIL/uL (ref 3.87–5.11)
RDW: 13.6 % (ref 11.5–15.5)
WBC: 11.9 10*3/uL — ABNORMAL HIGH (ref 4.0–10.5)

## 2015-02-08 LAB — BASIC METABOLIC PANEL
ANION GAP: 9 (ref 5–15)
BUN: 6 mg/dL (ref 6–20)
CO2: 31 mmol/L (ref 22–32)
Calcium: 8.3 mg/dL — ABNORMAL LOW (ref 8.9–10.3)
Chloride: 102 mmol/L (ref 101–111)
Creatinine, Ser: 0.45 mg/dL (ref 0.44–1.00)
GFR calc non Af Amer: >60 mL/min (ref >60)
Glucose, Bld: 102 mg/dL — ABNORMAL HIGH (ref 65–99)
POTASSIUM: 3.3 mmol/L — AB (ref 3.5–5.1)
Sodium: 142 mmol/L (ref 135–145)

## 2015-02-08 LAB — SODIUM
SODIUM: 146 mmol/L — AB (ref 135–145)
Sodium: 144 mmol/L (ref 135–145)
Sodium: 144 mmol/L (ref 135–145)

## 2015-02-08 LAB — TROPONIN I
Troponin I: 0.03 ng/mL (ref <0.031)
Troponin I: <0.03 ng/mL (ref <0.031)

## 2015-02-08 MED ORDER — METOPROLOL TARTRATE 12.5 MG HALF TABLET
12.5000 mg | ORAL_TABLET | Freq: Two times a day (BID) | ORAL | Status: DC
  Administered 2015-02-08 – 2015-02-10 (×5): 12.5 mg via ORAL
  Filled 2015-02-08 (×6): qty 1

## 2015-02-08 MED ORDER — POTASSIUM CHLORIDE CRYS ER 20 MEQ PO TBCR
40.0000 meq | EXTENDED_RELEASE_TABLET | ORAL | Status: AC
  Administered 2015-02-08 (×2): 40 meq via ORAL
  Filled 2015-02-08 (×2): qty 2

## 2015-02-08 NOTE — Progress Notes (Signed)
Patient ID: [name], female   DOB: 1935-08-13, 80 y.o.   MRN: [id number] Awake,moves all 4 extremities, dysmetric with rue. Ct shows increase of ventricular system with 4th off midline. ?vermis and tonsils. Plan to keep a close clinical eye on her. IVC system is in her floor

## 2015-02-08 NOTE — Consult Note (Signed)
Physical Medicine and Rehabilitation Consult Reason for Consult: Large right hemispheric cerebellar bleed Referring Physician: Dr. Kathyrn Sheriff   HPI: [name] is a 80 y.o. right handed female with history of hypertension, transient atrial fibrillation maintained on aspirin, diastolic congestive heart failure, COPD, recurrent bladder papillary carcinoma as well as breast cancer with mastectomy. Patient lives with husband and independent prior to admission. Presented 02/05/2015 with dizziness and nausea/vomiting as well as bouts of blurred vision. Denies any recent fall or head trauma. CT of the head showed a 4 cm acute hemorrhage within the right cerebellar hemisphere with surrounding edema and mass effect on the fourth ventricle as well as lacunar infarct of the right caudate head appeared to be remote. Underwent right retrosigmoid suboccipital craniotomy, evacuation of hematoma 02/05/2015 per Dr. Kathyrn Sheriff. Hospital course blood pressure management. Subcutaneous heparin was later initiated for DVT prophylaxis 02/06/2015. Follow-up cranial CT scans shows that right cerebellar hematoma remains spanning over a 4.1 x 3.5 cm versus 4.4 x 3.5 cm as well as surrounding vasogenic edema mildly progressed. A follow-up cranial CT scan for 02/08/2015 is pending. Echocardiogram pending. Patient is on a regular consistency diet.  Patient is on 3% normal saline drip with increased rate today. Cardiology consult recommended Lopressor and no anticoagulation for atrial fibrillation with rapid ventricular response. Overall rates have improved now in the 80-110 rate No vomiting, no hiccups Review of Systems  Constitutional: Negative for fever and chills.  Eyes: Positive for blurred vision.  Respiratory: Negative for cough.        Dyspnea on exertion  Cardiovascular: Positive for palpitations.  Gastrointestinal: Positive for nausea, vomiting and constipation.       GERD  Genitourinary: Negative for  dysuria and frequency.  Musculoskeletal: Positive for myalgias and joint pain.  Neurological: Positive for dizziness. Negative for headaches.   Past Medical History  Diagnosis Date  . History of CHF (congestive heart failure)     POST SURG 2010  . COPD (chronic obstructive pulmonary disease)   . History of breast cancer     DX DCIS IN 2004--  S/P RIGHT MASTECTOMY , NO CHEMORADIATION---  NO RECURRENCE  . Glaucoma of both eyes   . History of atrial fibrillation without current medication 2010    POST SURG 2010--  PER PT NO ISSUES SINCE  . Arthritis   . Dyspnea on exertion   . GERD (gastroesophageal reflux disease)   . Nephrolithiasis   . History of shingles     02/ 2015--  back of neck and left flank--  no residual pain  . History of hypertension   . Recurrent bladder papillary carcinoma first dx 06/ 2014    s/p  turbt's  and instillation mitomycin c (chemo)   Past Surgical History  Procedure Laterality Date  . Total knee arthroplasty Right 03-22-2009  . Partial mastectomy with needle localization Right 01-14-2003    DCIS  . Total mastectomy Right 02-03-2003    W/ SLN BX  AND  POST 03-01-2003 EVACUATION HEMATOMA  . Removal cyst left hand  2013  . Transthoracic echocardiogram  03-25-2009    MILD LVF/ EF 70-80%/ MILD INCREASE SYSTOLIC PULMONARY  PRESSURE  . Transurethral resection of bladder tumor with gyrus (turbt-gyrus) N/A 03/26/2013    Procedure: TRANSURETHRAL RESECTION OF BLADDER TUMOR WITH GYRUS (TURBT-GYRUS);  Surgeon: Alexis Frock, MD;  Location: [facility];  Service: Urology;  Laterality: N/A;  . Cystoscopy w/ ureteral stent placement Bilateral 03/26/2013    Procedure:  CYSTOSCOPY WITH BILATERAL RETROGRADE PYELOGRAM/URETERAL STENT PLACEMENT;  Surgeon: Alexis Frock, MD;  Location: [facility];  Service: Urology;  Laterality: Bilateral;  . Transurethral resection of bladder tumor with gyrus (turbt-gyrus) N/A 05/13/2013    Procedure:  TRANSURETHRAL RESECTION OF BLADDER TUMOR WITH GYRUS (TURBT-GYRUS)  RE-STAGING TRANSURETHRAL RESECTION OF BLADDER TUMOR, LEFT RETROGRADE PYELOGRAM AND STENT EXCHANGE;  Surgeon: Alexis Frock, MD;  Location: [facility];  Service: Urology;  Laterality: N/A;  . Cystoscopy w/ ureteral stent placement Left 05/13/2013    Procedure: CYSTOSCOPY WITH RETROGRADE PYELOGRAM/URETERAL STENT PLACEMENT STENT EXCHANGE;  Surgeon: Alexis Frock, MD;  Location: [facility];  Service: Urology;  Laterality: Left;  . Tubal ligation    . Cataract extraction w/ intraocular lens  implant, bilateral    . Transurethral resection of bladder tumor with gyrus (turbt-gyrus) N/A 08/11/2014    Procedure: TRANSURETHRAL RESECTION OF BLADDER TUMOR WITH GYRUS (TURBT-GYRUS);  Surgeon: Alexis Frock, MD;  Location: [facility];  Service: Urology;  Laterality: N/A;  . Cystoscopy w/ retrogrades Bilateral 08/11/2014    Procedure: CYSTOSCOPY WITH BILATERAL RETROGRADE PYELOGRAM AND MITOMYCIN INSTILLATION;  Surgeon: Alexis Frock, MD;  Location: [facility];  Service: Urology;  Laterality: Bilateral;  . Craniectomy N/A 02/05/2015    Procedure: SUBOCCIPITAL CRANIECTOMY EVACUATION OF HEMATOMA;  Surgeon: Consuella Lose, MD;  Location: [facility];  Service: Neurosurgery;  Laterality: N/A;   Family History  Problem Relation Age of Onset  . Colon cancer Mother 61  . Colon cancer Maternal Grandmother   . Breast cancer Maternal Aunt 57  . Cancer Maternal Aunt     ? ovarian  . Breast cancer Other 60    breast cancer, ovarian cancer  . Breast cancer Daughter 65   Social History:  reports that she quit smoking about 11 years ago. Her smoking use included Cigarettes. She has a 40 pack-year smoking history. She has never used smokeless tobacco. She reports that she does not drink alcohol or use illicit drugs. Allergies:  Allergies  Allergen Reactions  . Morphine And Related Nausea And  Vomiting   Medications Prior to Admission  Medication Sig Dispense Refill  . aspirin 81 MG tablet Take 81 mg by mouth daily.    Marland Kitchen latanoprost (XALATAN) 0.005 % ophthalmic solution Place 1 drop into both eyes at bedtime.    . levalbuterol (XOPENEX HFA) 45 MCG/ACT inhaler Inhale 1-2 puffs into the lungs every 4 (four) hours as needed for wheezing.    . pantoprazole (PROTONIX) 40 MG tablet Take 40 mg by mouth daily.  3  . SIMBRINZA 1-0.2 % SUSP Place 1 drop into both eyes 2 (two) times daily.   99  . amoxicillin (AMOXIL) 500 MG capsule Prior to dentist appointment      Home: Home Living Family/patient expects to be discharged to:: Private residence Living Arrangements: Spouse/significant other (and a son who has the more flexible schedule) Available Help at Discharge: Family Type of Home: House Home Access: Stairs to enter Technical brewer of Steps: 2 Entrance Stairs-Rails: None Home Layout: Two level, Able to live on main level with bedroom/bathroom Alternate Level Stairs-Number of Steps: flight Alternate Level Stairs-Rails: Right, Left Home Equipment: Walker - 2 wheels, Bedside commode, Shower seat - built in  Lives With: Spouse  Functional History: Prior Function Level of Independence: Independent Comments: drove, ran errands Functional Status:  Mobility: Bed Mobility Overal bed mobility: Needs Assistance Bed Mobility: Rolling, Sidelying to Sit, Sit to Supine Rolling: Min assist  Sidelying to sit: Mod assist Sit to supine: Mod assist General bed mobility comments: pt unable to coordinate RUE to assist trunk coming up from R to L.  pt need truncal control to stabilize for scoot to EOB Transfers Overall transfer level: Needs assistance Equipment used: 2 person hand held assist Transfers: Sit to/from Stand Sit to Stand: Mod assist, +2 physical assistance General transfer comment: 2 person assist for safety due to significant truncal ataxia Ambulation/Gait General Gait  Details: not tested    ADL: ADL Overall ADL's : Needs assistance/impaired Eating/Feeding: Minimal assistance, Bed level, Sitting Grooming: Wash/dry hands, Wash/dry face, Oral care, Brushing hair, Minimal assistance, Sitting Upper Body Bathing: Moderate assistance, Sitting Lower Body Bathing: Maximal assistance, Sit to/from stand Upper Body Dressing : Moderate assistance, Sitting Lower Body Dressing: Total assistance, Sit to/from stand Lower Body Dressing Details (indicate cue type and reason): Pt unable to access bil. feet due to ataxia and dizziness Toilet Transfer: Total assistance Toilet Transfer Details (indicate cue type and reason): unable to perform safely this date  Toileting- Water quality scientist and Hygiene: Total assistance, Sit to/from stand Functional mobility during ADLs: Moderate assistance, +2 for physical assistance  Cognition: Cognition Overall Cognitive Status: Impaired/Different from baseline Arousal/Alertness: Awake/alert Orientation Level: Oriented X4 Attention: Sustained Sustained Attention: Appears intact Memory: Appears intact Awareness: Appears intact Problem Solving: Appears intact Executive Function: Decision Making Decision Making: Appears intact Safety/Judgment: Appears intact Cognition Arousal/Alertness: Awake/alert, Lethargic Behavior During Therapy: Flat affect Overall Cognitive Status: Impaired/Different from baseline Area of Impairment: Attention, Safety/judgement, Awareness Current Attention Level: Sustained Safety/Judgement: Decreased awareness of deficits Awareness: Intellectual  Blood pressure 125/76, pulse 66, temperature 99.3 F (37.4 C), temperature source Oral, resp. rate 20, height _0  (1.676 m), weight 74.2 kg (163 lb 9.3 oz), last menstrual period 12/03/2002, SpO2 96 %. Physical Exam  Constitutional: She appears well-developed.  HENT:  Craniotomy site clean and dry  Eyes:  Pupils sluggish but reactive to light without  nystagmus  Neck: Normal range of motion. Neck supple. No thyromegaly present.  Cardiovascular: Normal rate and regular rhythm.   Respiratory: Effort normal and breath sounds normal. No respiratory distress.  GI: Soft. Bowel sounds are normal. She exhibits no distension.  Neurological: She is alert.  Mood is flat but appropriate. She is able to provide her name, age date of birth in place. Fair awareness of deficits. Follows simple commands  Severe dysmetria right finger-nose-finger, moderate dysmetria right heel to shin, minimal dysmetria on left finger-nose-finger, and no evidence of dysmetria left heel-to-shin Motor strength is 4/5 bilateral deltoid, biceps, triceps, grip, hip flexor, knee extensor, ankle dorsal flexor plantar flexor   Results for orders placed or performed during the hospital encounter of 02/05/15 (from the past 24 hour(s))  CBC     Status: Abnormal   Collection Time: 02/07/15  5:50 AM  Result Value Ref Range   WBC 12.2 (H) 4.0 - 10.5 K/uL   RBC 3.69 (L) 3.87 - 5.11 MIL/uL   Hemoglobin 11.4 (L) 12.0 - 15.0 g/dL   HCT 34.7 (L) 36.0 - 46.0 %   MCV 94.0 78.0 - 100.0 fL   MCH 30.9 26.0 - 34.0 pg   MCHC 32.9 30.0 - 36.0 g/dL   RDW 13.7 11.5 - 15.5 %   Platelets 183 150 - 400 K/uL  Basic metabolic panel     Status: Abnormal   Collection Time: 02/07/15  5:50 AM  Result Value Ref Range   Sodium 137 135 - 145 mmol/L   Potassium  4.0 3.5 - 5.1 mmol/L   Chloride 99 (L) 101 - 111 mmol/L   CO2 29 22 - 32 mmol/L   Glucose, Bld 115 (H) 65 - 99 mg/dL   BUN 10 6 - 20 mg/dL   Creatinine, Ser 0.53 0.44 - 1.00 mg/dL   Calcium 8.4 (L) 8.9 - 10.3 mg/dL   GFR calc non Af Amer >60 >60 mL/min   GFR calc Af Amer >60 >60 mL/min   Anion gap 9 5 - 15  Sodium     Status: None   Collection Time: 02/07/15  9:50 AM  Result Value Ref Range   Sodium 138 135 - 145 mmol/L  Sodium     Status: None   Collection Time: 02/07/15  3:00 PM  Result Value Ref Range   Sodium 142 135 - 145 mmol/L    Urinalysis, Routine w reflex microscopic (not at [facility])     Status: None   Collection Time: 02/07/15  3:00 PM  Result Value Ref Range   Color, Urine YELLOW YELLOW   APPearance CLEAR CLEAR   Specific Gravity, Urine 1.007 1.005 - 1.030   pH 7.0 5.0 - 8.0   Glucose, UA NEGATIVE NEGATIVE mg/dL   Hgb urine dipstick NEGATIVE NEGATIVE   Bilirubin Urine NEGATIVE NEGATIVE   Ketones, ur NEGATIVE NEGATIVE mg/dL   Protein, ur NEGATIVE NEGATIVE mg/dL   Urobilinogen, UA 0.2 0.0 - 1.0 mg/dL   Nitrite NEGATIVE NEGATIVE   Leukocytes, UA NEGATIVE NEGATIVE  Sodium     Status: None   Collection Time: 02/07/15  9:04 PM  Result Value Ref Range   Sodium 143 135 - 145 mmol/L  Troponin I (q 6hr x 3)     Status: None   Collection Time: 02/07/15 10:40 PM  Result Value Ref Range   Troponin I <0.03 <0.031 ng/mL  Basic metabolic panel     Status: Abnormal   Collection Time: 02/07/15 10:40 PM  Result Value Ref Range   Sodium 143 135 - 145 mmol/L   Potassium 3.1 (L) 3.5 - 5.1 mmol/L   Chloride 102 101 - 111 mmol/L   CO2 32 22 - 32 mmol/L   Glucose, Bld 111 (H) 65 - 99 mg/dL   BUN 8 6 - 20 mg/dL   Creatinine, Ser 0.53 0.44 - 1.00 mg/dL   Calcium 8.3 (L) 8.9 - 10.3 mg/dL   GFR calc non Af Amer >60 >60 mL/min   GFR calc Af Amer >60 >60 mL/min   Anion gap 9 5 - 15  Magnesium     Status: None   Collection Time: 02/07/15 10:40 PM  Result Value Ref Range   Magnesium 2.0 1.7 - 2.4 mg/dL  CBC     Status: Abnormal   Collection Time: 02/08/15  3:35 AM  Result Value Ref Range   WBC 11.9 (H) 4.0 - 10.5 K/uL   RBC 3.98 3.87 - 5.11 MIL/uL   Hemoglobin 12.0 12.0 - 15.0 g/dL   HCT 37.8 36.0 - 46.0 %   MCV 95.0 78.0 - 100.0 fL   MCH 30.2 26.0 - 34.0 pg   MCHC 31.7 30.0 - 36.0 g/dL   RDW 13.6 11.5 - 15.5 %   Platelets 199 150 - 400 K/uL  Basic metabolic panel     Status: Abnormal   Collection Time: 02/08/15  3:35 AM  Result Value Ref Range   Sodium 142 135 - 145 mmol/L   Potassium 3.3 (L) 3.5 - 5.1 mmol/L    Chloride 102  101 - 111 mmol/L   CO2 31 22 - 32 mmol/L   Glucose, Bld 102 (H) 65 - 99 mg/dL   BUN 6 6 - 20 mg/dL   Creatinine, Ser 0.45 0.44 - 1.00 mg/dL   Calcium 8.3 (L) 8.9 - 10.3 mg/dL   GFR calc non Af Amer >60 >60 mL/min   GFR calc Af Amer >60 >60 mL/min   Anion gap 9 5 - 15  Troponin I (q 6hr x 3)     Status: None   Collection Time: 02/08/15  3:35 AM  Result Value Ref Range   Troponin I 0.03 <0.031 ng/mL   Ct Head Wo Contrast  02/06/2015   CLINICAL DATA:  80 year old female post evacuation right cerebellar hematoma. Dizziness with nausea. Speech difficulty with trouble writing. Subsequent exam.  EXAM: CT HEAD WITHOUT CONTRAST  TECHNIQUE: Contiguous axial images were obtained from the base of the skull through the vertex without intravenous contrast.  COMPARISON:  02/05/2015 CT.  FINDINGS: Post right occipital craniotomy for drainage of right cerebellar hematoma. Although there has been debulking of the central aspect of the hematoma, right cerebellar hematoma remains spanning over 4.1 x 3.5 cm versus prior 4.4 x 3.5 cm.  Surrounding vasogenic edema and mass effect have progressed slightly. Compression of the fourth ventricle has progressed since the prior exam. Right cerebellar tonsil extension into the foramen magnum and upward herniation of the right cerebellum. Compression of the right aspect of the quadrigeminal plate has progressed slightly. Ventricular size including asymmetric mild prominence of the left temporal horn relatively similar to prior exam. Close follow-up recommended to exclude developing hydrocephalus.  Small amount of pneumocephalus  Remote mid right coronal radiata infarct.  Small vessel disease type changes.  Vascular calcifications.  IMPRESSION: Post right occipital craniotomy for drainage of right cerebellar hematoma. Although there has been debulking of the central aspect of the hematoma, right cerebellar hematoma remains spanning over 4.1 x 3.5 cm versus prior 4.4 x 3.5  cm.  Surrounding vasogenic edema and mass effect have progressed slightly. Compression of the fourth ventricle has progressed since the prior exam. Right cerebellar tonsil extension into the foramen magnum and upward herniation of the right cerebellum. Compression of the right aspect of the quadrigeminal plate has progressed slightly. Ventricular size including asymmetric mild prominence of the left temporal horn relatively similar to prior exam. Patient is at risk for development of hydrocephalus. Close follow-up recommended.  These results will be called to the ordering clinician or representative by the Radiologist Assistant, and communication documented in the PACS or zVision Dashboard.   Electronically Signed   By: Genia Del M.D.   On: 02/06/2015 12:55    Assessment/Plan: Diagnosis: Right cerebellar hemorrhage status post decompression with severe right hemiataxia 1. Does the need for close, 24 hr/day medical supervision in concert with the patient's rehab needs make it unreasonable for this patient to be served in a less intensive setting? Yes 2. Co-Morbidities requiring supervision/potential complications: Atrial fibrillation with rapid ventricular response, cerebral edema 3. Due to bladder management, bowel management, safety, skin/wound care, disease management, medication administration, pain management and patient education, does the patient require 24 hr/day rehab nursing? Yes 4. Does the patient require coordinated care of a physician, rehab nurse, PT (1-2 hrs/day, 5 days/week), OT (1-2 hrs/day, 5 days/week) and SLP (0.5-1 hrs/day, 5 days/week) to address physical and functional deficits in the context of the above medical diagnosis(es)? Yes Addressing deficits in the following areas: balance, endurance, locomotion, strength, transferring, bowel/bladder  control, bathing, dressing, feeding, grooming, toileting and cognition 5. Can the patient actively participate in an intensive therapy  program of at least 3 hrs of therapy per day at least 5 days per week? No 6. The potential for patient to make measurable gains while on inpatient rehab is good once medically stable 7. Anticipated functional outcomes upon discharge from inpatient rehab are min assist  with PT, min assist with OT, supervision with SLP. 8. Estimated rehab length of stay to reach the above functional goals is: 14-18 days 9. Does the patient have adequate social supports and living environment to accommodate these discharge functional goals? Potentially 10. Anticipated D/C setting: Home 11. Anticipated post D/C treatments: [place] therapy 12. Overall Rehab/Functional Prognosis: good  RECOMMENDATIONS: This patient's condition is appropriate for continued rehabilitative care in the following setting: CIR once cerebral edema is under control and off  hypertonic saline Patient has agreed to participate in recommended program. Potentially Note that insurance prior authorization may be required for reimbursement for recommended care.  Comment:     02/08/2015

## 2015-02-08 NOTE — Progress Notes (Signed)
Pt's head CT showed mild hydrocephalus.  MD ([facility]) notified.  Dayshift RN also aware of CT results.  No new orders given.

## 2015-02-08 NOTE — Consult Note (Signed)
Reason for Consult: Paroxysmal atrial fibrillation  Requesting Physician: Kathyrn Sheriff  HPI: [name] is a 80 year old mild to moderately overweight Caucasian female who was admitted on 02/05/15 with a cerebellar hemorrhage. She has a history of hypertension in the past. She has COPD and history of breast cancer. She's reports a remote history of PAF in the setting of knee replacement which spontaneously converted. She has been followed by neurosurgery and has had a drain placed. CT scans have shown. Hemorrhage edema with some tonsillar herniation that she's remained neurologically intact. She is on subcutaneous heparin for DVT prophylaxis. She developed atrial fibrillation with rapid ventricular response last night and was placed on intravenous diltiazem. Her heart rates are in the 70s to 80s and she is asymptomatic from this.  Problem List: Patient Active Problem List   Diagnosis Date Noted  . Acute cerebellar hemorrhage   . Hypokalemia   . Chronic combined systolic and diastolic congestive heart failure   . Atrial fibrillation with RVR   . Cerebellar hemorrhage 02/05/2015  . COPD bronchitis 12/30/2014  . GERD (gastroesophageal reflux disease) 12/30/2014  . Bladder cancer 01/01/2014  . CHF (congestive heart failure) 01/01/2014  . Glaucoma 01/01/2014  . 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 History of CHF (congestive heart failure)     POST SURG 2010  . COPD (chronic obstructive pulmonary disease)   . History of breast cancer     DX DCIS IN 2004--  S/P RIGHT MASTECTOMY , NO CHEMORADIATION---  NO RECURRENCE  . Glaucoma of both eyes   . History of atrial fibrillation without current medication 2010    POST SURG 2010--  PER PT NO ISSUES SINCE  . Arthritis   . Dyspnea on exertion   . GERD (gastroesophageal reflux disease)   . Nephrolithiasis   . History of shingles     02/ 2015--  back of neck and left  flank--  no residual pain  . History of hypertension   . Recurrent bladder papillary carcinoma first dx 06/ 2014    s/p  turbt's  and instillation mitomycin c (chemo)   Past Surgical History  Procedure Laterality Date  . Total knee arthroplasty Right 03-22-2009  . Partial mastectomy with needle localization Right 01-14-2003    DCIS  . Total mastectomy Right 02-03-2003    W/ SLN BX  AND  POST 03-01-2003 EVACUATION HEMATOMA  . Removal cyst left hand  2013  . Transthoracic echocardiogram  03-25-2009    MILD LVF/ EF 70-80%/ MILD INCREASE SYSTOLIC PULMONARY  PRESSURE  . Transurethral resection of bladder tumor with gyrus (turbt-gyrus) N/A 03/26/2013    Procedure: TRANSURETHRAL RESECTION OF BLADDER TUMOR WITH GYRUS (TURBT-GYRUS);  Surgeon: Alexis Frock, MD;  Location: [facility];  Service: Urology;  Laterality: N/A;  . Cystoscopy w/ ureteral stent placement Bilateral 03/26/2013    Procedure: CYSTOSCOPY WITH BILATERAL RETROGRADE PYELOGRAM/URETERAL STENT PLACEMENT;  Surgeon: Alexis Frock, MD;  Location: [facility];  Service: Urology;  Laterality: Bilateral;  . Transurethral resection of bladder tumor with gyrus (turbt-gyrus) N/A 05/13/2013    Procedure: TRANSURETHRAL RESECTION OF BLADDER TUMOR WITH GYRUS (TURBT-GYRUS)  RE-STAGING TRANSURETHRAL RESECTION OF BLADDER TUMOR, LEFT RETROGRADE PYELOGRAM AND STENT EXCHANGE;  Surgeon: Alexis Frock, MD;  Location: [facility];  Service: Urology;  Laterality: N/A;  . Cystoscopy w/ ureteral stent placement Left 05/13/2013    Procedure: CYSTOSCOPY WITH RETROGRADE PYELOGRAM/URETERAL  STENT PLACEMENT STENT EXCHANGE;  Surgeon: Alexis Frock, MD;  Location: [facility];  Service: Urology;  Laterality: Left;  . Tubal ligation    . Cataract extraction w/ intraocular lens  implant, bilateral    . Transurethral resection of bladder tumor with gyrus (turbt-gyrus) N/A 08/11/2014    Procedure: TRANSURETHRAL  RESECTION OF BLADDER TUMOR WITH GYRUS (TURBT-GYRUS);  Surgeon: Alexis Frock, MD;  Location: [facility];  Service: Urology;  Laterality: N/A;  . Cystoscopy w/ retrogrades Bilateral 08/11/2014    Procedure: CYSTOSCOPY WITH BILATERAL RETROGRADE PYELOGRAM AND MITOMYCIN INSTILLATION;  Surgeon: Alexis Frock, MD;  Location: [facility];  Service: Urology;  Laterality: Bilateral;  . Craniectomy N/A 02/05/2015    Procedure: SUBOCCIPITAL CRANIECTOMY EVACUATION OF HEMATOMA;  Surgeon: Consuella Lose, MD;  Location: [facility];  Service: Neurosurgery;  Laterality: N/A;    FAMHx: Family History  Problem Relation Age of Onset  . Colon cancer Mother 60  . Colon cancer Maternal Grandmother   . Breast cancer Maternal Aunt 33  . Cancer Maternal Aunt     ? ovarian  . Breast cancer Other 60    breast cancer, ovarian cancer  . Breast cancer Daughter 50    SOCHx:  reports that she quit smoking about 11 years ago. Her smoking use included Cigarettes. She has a 40 pack-year smoking history. She has never used smokeless tobacco. She reports that she does not drink alcohol or use illicit drugs.  ALLERGIES: Allergies  Allergen Reactions  . Morphine And Related Nausea And Vomiting    ROS: Pertinent items are noted in HPI.  HOME MEDICATIONS: Prescriptions prior to admission  Medication Sig Dispense Refill Last Dose  . aspirin 81 MG tablet Take 81 mg by mouth daily.   02/04/2015 at Unknown time  . latanoprost (XALATAN) 0.005 % ophthalmic solution Place 1 drop into both eyes at bedtime.   02/04/2015 at Unknown time  . levalbuterol (XOPENEX HFA) 45 MCG/ACT inhaler Inhale 1-2 puffs into the lungs every 4 (four) hours as needed for wheezing.   Past Month at Unknown time  . pantoprazole (PROTONIX) 40 MG tablet Take 40 mg by mouth daily.  3 02/05/2015 at Unknown time  . SIMBRINZA 1-0.2 % SUSP Place 1 drop into both eyes 2 (two) times daily.   99 02/04/2015 at Unknown time  .  amoxicillin (AMOXIL) 500 MG capsule Prior to dentist appointment   unknown    HOSPITAL MEDICATIONS: I have reviewed the patient's current medications.  VITALS: Blood pressure 121/58, pulse 95, temperature 98 F (36.7 C), temperature source Oral, resp. rate 24, height _0  (1.676 m), weight 163 lb 9.3 oz (74.2 kg), last menstrual period 12/03/2002, SpO2 96 %.  INPUT/OUTPUT I/O last 3 completed shifts: In: 3121.8 [P.O.:720; I.V.:2401.8] Out: 5945 [Urine:5945] Total I/O In: 240 [I.V.:240] Out: 325 [Urine:325]    PHYSICAL EXAM: General appearance: alert and no distress Neck: no adenopathy, no carotid bruit, no JVD, supple, symmetrical, trachea midline and thyroid not enlarged, symmetric, no tenderness/mass/nodules Lungs: clear to auscultation bilaterally Heart: irregularly irregular rhythm Extremities: extremities normal, atraumatic, no cyanosis or edema  LABS:  BMP  Recent Labs  02/07/15 0550  02/07/15 2240 02/08/15 0335 02/08/15 0905  NA 137  < > 143 142 144  K 4.0  --  3.1* 3.3*  --   CL 99*  --  102 102  --   CO2 29  --  32 31  --   GLUCOSE 115*  --  111* 102*  --   BUN 10  --  8 6  --   CREATININE 0.53  --  0.53 0.45  --   CALCIUM 8.4*  --  8.3* 8.3*  --   GFRNONAA >60  --  >60 >60  --   GFRAA >60  --  >60 >60  --   < > = values in this interval not displayed.  CBC  Recent Labs Lab 02/08/15 0335  WBC 11.9*  RBC 3.98  HGB 12.0  HCT 37.8  PLT 199  MCV 95.0    HEMOGLOBIN A1C Lab Results  Component Value Date   HGBA1C 6.2* 02/06/2015   MPG 131 02/06/2015    Cardiac Panel (last 3 results)  Recent Labs  02/07/15 2240 02/08/15 0335 02/08/15 0905  TROPONINI <0.03 0.03 <0.03    BNP (last 3 results) No results for input(s): PROBNP in the last 8760 hours.  TSH  Recent Labs  11/02/14 02/06/15 0934  TSH 1.72 0.278*    CHOLESTEROL  Recent Labs  11/02/14 02/06/15 0933  CHOL 207* 175    Hepatic Function Panel  Recent Labs   11/02/14 02/05/15 1016  PROT  --  7.0  ALBUMIN  --  4.2  AST 25 25  ALT 33 21  ALKPHOS 125 115  BILITOT  --  0.6    IMAGING: Ct Head Wo Contrast  02/08/2015   CLINICAL DATA:  80 year old female post surgical drainage of cerebellar hematoma. Follow-up. Subsequent encounter.  EXAM: CT HEAD WITHOUT CONTRAST  TECHNIQUE: Contiguous axial images were obtained from the base of the skull through the vertex without intravenous contrast.  COMPARISON:  02/06/2015.  FINDINGS: Post right suboccipital craniectomy for drainage of right-sided cerebellar parenchymal hematoma. Residual right cerebellar hematoma has changed configuration spanning over 4.6 x 2.9 cm versus prior 4.1 x 3.5 cm. Surrounding vasogenic edema and mass effect with mild downward herniation of the right cerebellar tonsil and upward herniation of the right vermis and cerebellum. Progressive mass effect upon the fourth ventricle, aqueduct and deformity of the brainstem/ quadrigeminal plate.  Interval enlargement of the lateral ventricles consistent with mild mild hydrocephalus. Clinical correlation and close follow-up recommended.  The very small anterior right posterior fossa subdural hematoma bordering the petrous bone.  Baseline mild global atrophy.  No CT evidence of large acute thrombotic infarct or mass separate from above described findings. Small vessel disease type changes are noted.  Vascular calcifications.  IMPRESSION: Interval development of mild hydrocephalus post partial drainage of right cerebellar hematoma. Clinical correlation and close CT monitoring recommended.  Change in configuration of the right cerebellar hematoma at as noted above. Surrounding vasogenic edema and mass effect including downward mild herniation of the right cerebellar tonsil and moderate upward herniation of the right vermis and cerebellum with compression of the fourth ventricle, aqueduct, brainstem and quadrigeminal plate.  These results were called by telephone  at the time of interpretation on 02/08/2015 at 6:54 am to Jenny Reichmann patient's nurse who verbally acknowledged these results.   Electronically Signed   By: Genia Del M.D.   On: 02/08/2015 07:15    IMPRESSION: 1. Paroxysmal atrial fibrillation with rapid ventricular response: The patient developed PAF with RVR last night in the setting of acute neurologic illness. She is currently rate controlled on IV diltiazem. Blood pressure is adequate. Given her hemorrhage is not a candidate for systemic anticoagulation. A 2-D echo was normal. I suspect she'll spontaneously convert back to sinus rhythm and that this was an epiphenomenon  from her cerebellar hemorrhage.   RECOMMENDATION: 1. Patient is not a candidate for systemic and coagulation. She is currently rate controlled on IV diltiazem and low-dose beta blocker was begun. We will continue to follow her heart rate. Hopefully she will convert spontaneously.  Time Spent Directly with Patient: 30 minutes  Quay Burow 02/08/2015, 2:06 PM

## 2015-02-08 NOTE — Progress Notes (Signed)
Physical Therapy Treatment Patient Details Name: [name] MRN: [id number] DOB: 03-02-1935 Today's Date: 02/08/2015    History of Present Illness [name] is an 80 y.o. female with a history of congestive heart failure, COPD, breast cancer, transient atrial fibrillation and hypertension, presenting with acute onset of ataxia with vertigo, nausea and blurred vision at 6 AM this morning. She has been on aspirin 81 mg most days. He has not been on anticoagulation. CT scan of her head showed a large right hemispheric cerebellar hematoma measuring 4.0 x 3.6 mm. There was rounding edema with significant mass effect on the fourth ventricle. There were no signs of hydrocephalus. NIH stroke score was 4.    PT Comments    Session focused on bed mobility and seated/standing balance. Primary functional deficit at this point is ataxic truncal support and right knee control. Session was completed on room air. Pt prognosis is good and plan for d/c to CIR remains appropriate.   Follow Up Recommendations  CIR     Equipment Recommendations       Recommendations for Other Services Rehab consult     Precautions / Restrictions Precautions Precautions: Fall Restrictions Weight Bearing Restrictions: No    Mobility  Bed Mobility Overal bed mobility: Needs Assistance Bed Mobility: Rolling;Sidelying to Sit;Sit to Supine Rolling: Min guard Sidelying to sit: Min assist   Sit to supine: Min guard   General bed mobility comments: pt unable to coordinate RUE to assist trunk coming up from R to L.  pt need truncal control to stabilize for scoot to EOB  Transfers Overall transfer level: Needs assistance Equipment used: 2 person hand held assist Transfers: Sit to/from Stand Sit to Stand: Mod assist;+2 physical assistance         General transfer comment: 2 person assist for safety due to significant truncal ataxia  Ambulation/Gait                 Stairs             Wheelchair Mobility    Modified Rankin (Stroke Patients Only) Modified Rankin (Stroke Patients Only) Pre-Morbid Rankin Score: No symptoms Modified Rankin: Severe disability     Balance Overall balance assessment: Needs assistance Sitting-balance support: Bilateral upper extremity supported Sitting balance-Leahy Scale: Fair Sitting balance - Comments: No assistance needed by end of session, min guard   Standing balance support: Bilateral upper extremity supported Standing balance-Leahy Scale: Poor Standing balance comment: With +2 assist pt was able to weight shift from side to side and forwards/backwards; R LE had a tendency to buckle                    Cognition Arousal/Alertness: Awake/alert Behavior During Therapy: Flat affect Overall Cognitive Status: Within Functional Limits for tasks assessed Area of Impairment: Safety/judgement;Awareness   Current Attention Level: Sustained     Safety/Judgement: Decreased awareness of safety;Decreased awareness of deficits Awareness: Intellectual        Exercises      General Comments General comments (skin integrity, edema, etc.): Noted nystagmus with horizontal pursuits; had a tendency to close eyes due to dizziness      Pertinent Vitals/Pain Pain Assessment: No/denies pain    Home Living                      Prior Function            PT Goals (current goals can now be found in the care plan  section) Acute Rehab PT Goals Patient Stated Goal: regain independence  PT Goal Formulation: With patient Time For Goal Achievement: 02/21/15 Potential to Achieve Goals: Good Progress towards PT goals: Progressing toward goals    Frequency  Min 4X/week    PT Plan      Co-evaluation     PT goals addressed during session: Mobility/safety with mobility;Balance       End of Session   Activity Tolerance: Patient tolerated treatment well;Patient limited by fatigue Patient left: with call bell/phone  within reach;in bed;with family/visitor present     Time: 6283-6629 PT Time Calculation (min) (ACUTE ONLY): 39 min  Charges:                       G Codes:      Elvera Bicker, SPT Acute Rehab Services [phone]   Akyah Lagrange 02/08/2015, 10:45 AM

## 2015-02-08 NOTE — Progress Notes (Signed)
Patient ID: [name], female   DOB: 12-04-1934, 80 y.o.   MRN: [id number] Stable, orienetd x 2. No weakness. No headache. Hold IVC

## 2015-02-08 NOTE — Progress Notes (Signed)
Charges for corresponding PT treatment    02/08/15 1000  PT Time Calculation  PT Start Time (ACUTE ONLY) 0948  PT Stop Time (ACUTE ONLY) 1027  PT Time Calculation (min) (ACUTE ONLY) 39 min  PT General Charges  $$ ACUTE PT VISIT 1 Procedure  PT Treatments  $Therapeutic Activity 23-37 mins  $Neuromuscular Re-education 8-22 mins   02/08/2015  Donnella Sham, PT [phone]  (pager)

## 2015-02-08 NOTE — Progress Notes (Signed)
STROKE TEAM PROGRESS NOTE   HISTORY [name] is an 80 y.o. female with a history of congestive heart failure, COPD, breast cancer, transient atrial fibrillation and hypertension, presenting with acute onset of ataxia with vertigo, nausea and blurred vision at 6 AM this morning. She has been on aspirin 81 mg most days. She has not been on anticoagulation. CT scan of her head showed a large right hemispheric cerebellar hematoma measuring 4.0 x 3.6 mm. There was rounding edema with significant mass effect on the fourth ventricle. There were no signs of hydrocephalus. NIH stroke score was 4.  LSN: 6 AM on 02/05/2015 tPA Given: No: Acute cerebellar hemorrhage mRankin:  SUBJECTIVE (INTERVAL HISTORY) Her daughter is at the bedside.  Overall her condition is stable. No headache, but still has double vision. Repeat CT head showed mild hydrocephalus. Continue 3% saline. Overnight has afib RVR and put on IV cardizem. Cardiology consult placed.  OBJECTIVE Temp:  [98 F (36.7 C)-99.3 F (37.4 C)] 98 F (36.7 C) (06/07 1203) Pulse Rate:  [33-146] 95 (06/07 1400) Cardiac Rhythm:  [-] Atrial fibrillation (06/07 0900) Resp:  [14-33] 24 (06/07 1400) BP: (89-164)/(41-108) 121/58 mmHg (06/07 1400) SpO2:  [89 %-100 %] 96 % (06/07 1400) Weight:  [163 lb 9.3 oz (74.2 kg)] 163 lb 9.3 oz (74.2 kg) (06/07 0400)   Recent Labs Lab 02/05/15 1031  GLUCAP 145*    Recent Labs Lab 02/05/15 1016 02/05/15 1508 02/06/15 0934 02/07/15 0550  02/07/15 2104 02/07/15 2240 02/08/15 0335 02/08/15 0905 02/08/15 1455  NA 138 138 138 137  < > 143 143 142 144 144  K 3.6 3.8 3.9 4.0  --   --  3.1* 3.3*  --   --   CL 101  --  103 99*  --   --  102 102  --   --   CO2 28  --  27 29  --   --  32 31  --   --   GLUCOSE 174* 163* 124* 115*  --   --  111* 102*  --   --   BUN 16  --  10 10  --   --  8 6  --   --   CREATININE 0.63  --  0.53 0.53  --   --  0.53 0.45  --   --   CALCIUM 9.0  --  8.4* 8.4*  --   --   8.3* 8.3*  --   --   MG  --   --   --   --   --   --  2.0  --   --   --   < > = values in this interval not displayed.  Recent Labs Lab 02/05/15 1016  AST 25  ALT 21  ALKPHOS 115  BILITOT 0.6  PROT 7.0  ALBUMIN 4.2    Recent Labs Lab 02/05/15 1016 02/05/15 1508 02/06/15 0934 02/07/15 0550 02/08/15 0335  WBC 12.4*  --  13.8* 12.2* 11.9*  NEUTROABS 9.7*  --   --   --   --   HGB 14.8 12.9 12.0 11.4* 12.0  HCT 44.9 38.0 36.4 34.7* 37.8  MCV 93.9  --  92.9 94.0 95.0  PLT 213  --  207 183 199    Recent Labs Lab 02/07/15 2240 02/08/15 0335 02/08/15 0905  TROPONINI <0.03 0.03 <0.03   No results for input(s): LABPROT, INR in the last 72 hours.  Recent Labs  02/07/15 1500  COLORURINE YELLOW  LABSPEC 1.007  PHURINE 7.0  GLUCOSEU NEGATIVE  HGBUR NEGATIVE  BILIRUBINUR NEGATIVE  KETONESUR NEGATIVE  PROTEINUR NEGATIVE  UROBILINOGEN 0.2  NITRITE NEGATIVE  LEUKOCYTESUR NEGATIVE       Component Value Date/Time   CHOL 175 02/06/2015 0933   TRIG 117 02/06/2015 0933   HDL 51 02/06/2015 0933   CHOLHDL 3.4 02/06/2015 0933   VLDL 23 02/06/2015 0933   LDLCALC 101* 02/06/2015 0933   Lab Results  Component Value Date   HGBA1C 6.2* 02/06/2015   No results found for: LABOPIA, COCAINSCRNUR, LABBENZ, AMPHETMU, THCU, LABBARB  No results for input(s): ETH in the last 168 hours.  I have personally reviewed the radiological images below and agree with the radiology interpretations.  Ct Head Wo Contrast 02/05/2015    1) 4.0 cm acute hemorrhage within the right cerebellar hemisphere with surrounding edema and mass effect on the fourth ventricle. There is no hydrocephalus. The estimated volume is 25-30 mL. 2) Lacunar infarct of the right caudate head appears remote.  3) No acute supratentorial abnormality.    Ct Head Wo Contrast 02/06/2015    IMPRESSION: Post right occipital craniotomy for drainage of right cerebellar hematoma. Although there has been debulking of the central  aspect of the hematoma, right cerebellar hematoma remains spanning over 4.1 x 3.5 cm versus prior 4.4 x 3.5 cm.  Surrounding vasogenic edema and mass effect have progressed slightly. Compression of the fourth ventricle has progressed since the prior exam. Right cerebellar tonsil extension into the foramen magnum and upward herniation of the right cerebellum. Compression of the right aspect of the quadrigeminal plate has progressed slightly. Ventricular size including asymmetric mild prominence of the left temporal horn relatively similar to prior exam. Patient is at risk for development of hydrocephalus. Close follow-up recommended.     02/08/2015   IMPRESSION: Interval development of mild hydrocephalus post partial drainage of right cerebellar hematoma. Clinical correlation and close CT monitoring recommended.  Change in configuration of the right cerebellar hematoma at as noted above. Surrounding vasogenic edema and mass effect including downward mild herniation of the right cerebellar tonsil and moderate upward herniation of the right vermis and cerebellum with compression of the fourth ventricle, aqueduct, brainstem and quadrigeminal plate.    Dg Chest Portable 1 View 02/05/2015    No acute cardiopulmonary disease.   2D echo - - Procedure narrative: Transthoracic echocardiography. Image quality was poor. The study was technically difficult, as a result of poor acoustic windows, poor sound wave transmission, and restricted patient mobility. - Left ventricle: The cavity size was normal. Wall thickness was increased in a pattern of mild LVH. Systolic function was vigorous. The estimated ejection fraction was in the range of 65% to 70%. Wall motion was normal; there were no regional wall motion abnormalities. Doppler parameters are consistent with abnormal left ventricular relaxation (grade 1 diastolic dysfunction). The E/e&' ratio is between 8-15, suggesting indeterminate LV  filling pressure. - Aortic valve: Poorly visualized. There was no stenosis. There was no regurgitation. - Mitral valve: Calcified annulus. There was trivial regurgitation. - Left atrium: The atrium was normal in size. - Right atrium: The atrium was mildly dilated.  PHYSICAL EXAM  Temp:  [98 F (36.7 C)-99.3 F (37.4 C)] 98 F (36.7 C) (06/07 1203) Pulse Rate:  [33-146] 95 (06/07 1400) Resp:  [14-33] 24 (06/07 1400) BP: (89-164)/(41-108) 121/58 mmHg (06/07 1400) SpO2:  [89 %-100 %] 96 % (06/07 1400) Weight:  [163 lb 9.3 oz (74.2 kg)] 163  lb 9.3 oz (74.2 kg) (06/07 0400)  General - Well nourished, well developed, in no apparent distress.  Ophthalmologic - Fundi not visualized due to eye movement and nystagmus.  Cardiovascular - Regular rate and rhythm.  Mental Status -  Level of arousal and orientation to time, place, and person were intact. Language including expression, naming, repetition, comprehension was assessed and found intact. Fund of Knowledge was assessed and was intact.  Cranial Nerves II - XII - II - Visual field intact OU. III, IV, VI - Extraocular movements showed mild right gaze difficulty with mild right CN VI palsy. V - Facial sensation intact bilaterally. VII - Facial movement intact bilaterally, but right nasolabial fold flattening. VIII - Hearing & vestibular intact bilaterally, horizontal nystagmus on right side gaze, rotational nystagmus on upward and downward gaze. X - Palate elevates symmetrically. XI - Chin turning & shoulder shrug intact bilaterally. XII - Tongue protrusion intact.  Motor Strength - The patient's strength was normal in all extremities and pronator drift was absent.  Bulk was normal and fasciculations were absent.   Motor Tone - Muscle tone was assessed at the neck and appendages and was normal.  Reflexes - The patient's reflexes were 1+ in all extremities and she had no pathological reflexes.  Sensory - Light touch,  temperature/pinprick were assessed and were symmetrical.    Coordination - The patient had ataxia on the right FTN and HTS, intact on the left.  Tremor was absent.  Gait and Station - not tested due to safety concerns.   ASSESSMENT/PLAN [name] is a 80 y.o. female with history of COPD, breast cancer on remission, bladder cancer s/p chemotherapy and surgery, paroxysmal A. fib on aspirin and the CHF presenting with ataxia, vertigo and nausea vomiting. CT showed right cerebellar ICH. She received right cerebellar hematoma evacuation.     Right cerebellar ICH:  S/p hematoma evacuation. Etiology not clear, HTN, vs. CAA vs. AVM vs. Brain mets. Patient blood pressure well controlled at home and after admission, and malignancy also in remission. Will need follow-up MRI, however patient reluctant to do any MRI.  Resultant  diplopia, nystagmus, ataxia  CT showed right cerebellar hemorrhage  Repeat CT showed mild hydrocephalus - need close watch and consider repeat head CT on 02/10/15  2D Echo unremarkable  LDL 101  HgbA1c 6.2  Heparin subcutaneous for VTE prophylaxis Diet regular Room service appropriate?: Yes; Fluid consistency:: Thin  aspirin 81 mg orally every day prior to admission, now on no antithrombotic  Ongoing aggressive stroke risk factor management  Therapy recommendations:  Pending  Disposition:  Pending  Cerebellar edema  Due to cerebellar hematoma  Put on 3% saline   Goal Na 150-160  Na Q6  Sodium today 144 - increased 3% saline to 75 cc/ hour  Close watch for hydrocephalus  Paroxysmal Afib with RVR  Cardiology consulted  Lisinopril switched to metoprolol home dose  On IV Cardizem  Rate controlled  On aspirin 81 at home  Patient is likely not anticoagulation candidate due to spontaneous cerebellar hemorrhage.   Hypertension  BP stable  Pt denies hx of HTN  Lisinopril switched to metoprolol  Hyperlipidemia  LDL 101, goal <  70  Add Lipitor 10  Continue statin at discharge  Other Stroke Risk Factors  Advanced age  Former smoker for 40 years  Breast cancer in remission  Bladder cancer were treated last December  CHF  Other Active Problems  Leukocytosis  Hospital day # 3  This patient is critically ill due to right cerebellar ICH s/p evacuation, A. fib with RVR, mild hydrocephalus and at significant risk of neurological worsening, death form cerebellar edema, transtentorial herniation, obstructive hydrocephalus and respiratory failure. This patient's care requires constant monitoring of vital signs, hemodynamics, respiratory and cardiac monitoring, review of multiple databases, neurological assessment, discussion with family, other specialists and medical decision making of high complexity. I spent 45 minutes of neurocritical care time in the care of this patient.  Rosalin Hawking, MD PhD Stroke Neurology 02/08/2015 3:44 PM   To contact Stroke Continuity provider, please refer to [email or web address] After hours, contact General Neurology

## 2015-02-08 NOTE — Progress Notes (Signed)
[facility] Progress Note Patient Name: [name] DOB: Nov 14, 1934 MRN: [id number]   Date of Service  02/08/2015  HPI/Events of Note  Hypokalemia in the setting of AF/RVR  eICU Interventions  Plan: Potassium replaced     Intervention Category Intermediate Interventions: Electrolyte abnormality - evaluation and management  [name] 02/08/2015, 12:00 AM

## 2015-02-08 NOTE — Consult Note (Signed)
PULMONARY / CRITICAL CARE MEDICINE   Name: [name] MRN: [id number] DOB: Oct 13, 1934    ADMISSION DATE:  02/05/2015 CONSULTATION DATE:  02/07/2015  REFERRING MD :  Dr. Kathyrn Sheriff  CHIEF COMPLAINT:  ICH, now AF-RVR  INITIAL PRESENTATION:  80 yo female with dizziness, N/V, diplopia, nystagmus, ataxia from Rt cerebellar hemorrhage.  Developed A fib with RVR 6/06 and PCCM consulted.  STUDIES:  6/4 CT head > 4 cm Rt cerebellar hemorrhage with edema and mass effect on 4th ventricle, remote lacunar infarct 6/5 CT head > 4.1 cm Rt cerebellar hematoma, edema, mass effect  SIGNIFICANT EVENTS: 6/4 Admit, Rt retrosigmoid suboccipital craniotomy 6/6 Add decadron, 3% NS; A fib with RVR >> started cardizem   SUBJECTIVE:  Denies chest pain, dyspnea, palpitations.  VITAL SIGNS: Temp:  [98.2 F (36.8 C)-99.3 F (37.4 C)] 99.3 F (37.4 C) (06/07 0400) Pulse Rate:  [33-146] 87 (06/07 0700) Resp:  [14-33] 18 (06/07 0700) BP: (111-169)/(47-108) 121/75 mmHg (06/07 0630) SpO2:  [94 %-100 %] 98 % (06/07 0700) Weight:  [163 lb 9.3 oz (74.2 kg)] 163 lb 9.3 oz (74.2 kg) (06/07 0400) INTAKE / OUTPUT:  Intake/Output Summary (Last 24 hours) at 02/08/15 0739 Last data filed at 02/08/15 0700  Gross per 24 hour  Intake 1981.79 ml  Output   5000 ml  Net -3018.21 ml    PHYSICAL EXAMINATION: General: follows commands Neuro: moves extremities HEENT: IJ site clean Cardiovascular: irregular, no murmur Lungs: no wheeze Abdomen:  Soft, non-tender Musculoskeletal:  No edema Skin: no rashes  LABS:  CBC  Recent Labs Lab 02/06/15 0934 02/07/15 0550 02/08/15 0335  WBC 13.8* 12.2* 11.9*  HGB 12.0 11.4* 12.0  HCT 36.4 34.7* 37.8  PLT 207 183 199   Coag's  Recent Labs Lab 02/05/15 1016  APTT 30  INR 1.06   BMET  Recent Labs Lab 02/07/15 0550  02/07/15 2104 02/07/15 2240 02/08/15 0335  NA 137  < > 143 143 142  K 4.0  --   --  3.1* 3.3*  CL 99*  --   --  102 102  CO2 29  --    --  32 31  BUN 10  --   --  8 6  CREATININE 0.53  --   --  0.53 0.45  GLUCOSE 115*  --   --  111* 102*  < > = values in this interval not displayed.   Electrolytes  Recent Labs Lab 02/07/15 0550 02/07/15 2240 02/08/15 0335  CALCIUM 8.4* 8.3* 8.3*  MG  --  2.0  --    Liver Enzymes  Recent Labs Lab 02/05/15 1016  AST 25  ALT 21  ALKPHOS 115  BILITOT 0.6  ALBUMIN 4.2   Cardiac Enzymes  Recent Labs Lab 02/07/15 2240 02/08/15 0335  TROPONINI <0.03 0.03   Glucose  Recent Labs Lab 02/05/15 1031  GLUCAP 145*    Imaging Ct Head Wo Contrast  02/08/2015   CLINICAL DATA:  80 year old female post surgical drainage of cerebellar hematoma. Follow-up. Subsequent encounter.  EXAM: CT HEAD WITHOUT CONTRAST  TECHNIQUE: Contiguous axial images were obtained from the base of the skull through the vertex without intravenous contrast.  COMPARISON:  02/06/2015.  FINDINGS: Post right suboccipital craniectomy for drainage of right-sided cerebellar parenchymal hematoma. Residual right cerebellar hematoma has changed configuration spanning over 4.6 x 2.9 cm versus prior 4.1 x 3.5 cm. Surrounding vasogenic edema and mass effect with mild downward herniation of the right cerebellar tonsil and upward herniation  of the right vermis and cerebellum. Progressive mass effect upon the fourth ventricle, aqueduct and deformity of the brainstem/ quadrigeminal plate.  Interval enlargement of the lateral ventricles consistent with mild mild hydrocephalus. Clinical correlation and close follow-up recommended.  The very small anterior right posterior fossa subdural hematoma bordering the petrous bone.  Baseline mild global atrophy.  No CT evidence of large acute thrombotic infarct or mass separate from above described findings. Small vessel disease type changes are noted.  Vascular calcifications.  IMPRESSION: Interval development of mild hydrocephalus post partial drainage of right cerebellar hematoma. Clinical  correlation and close CT monitoring recommended.  Change in configuration of the right cerebellar hematoma at as noted above. Surrounding vasogenic edema and mass effect including downward mild herniation of the right cerebellar tonsil and moderate upward herniation of the right vermis and cerebellum with compression of the fourth ventricle, aqueduct, brainstem and quadrigeminal plate.  These results were called by telephone at the time of interpretation on 02/08/2015 at 6:54 am to Jenny Reichmann patient's nurse who verbally acknowledged these results.   Electronically Signed   By: Genia Del M.D.   On: 02/08/2015 07:15     ASSESSMENT / PLAN: NEUROLOGIC A:   Intracerebellar hemorrhage s/p hematoma evacuation. P:   3% NS, decadron, post op care per neurosurgery and neurology  PULMONARY A: Former smoker with reported hx of COPD. P:   Supplemental O2 PRN PRN xopenex  CARDIOVASCULAR Lt IJ CVL 4/4 > A:  A fib with RVR developed 6/06. Hx of HTN, diastolic heart failure. P:  Cardizem gtt with goal HR < 115 F/u Echo Recommend cardiology consult [place] whether she candidate for ASA/anti coagulation to neurosurgery in setting of [place] SBP goal 90 to 160 in setting of ICH  RENAL A:   Hypokalemia. P:   F/u and replace electrolytes as needed  GASTROINTESTINAL A:   GERD. P:   Protonix  HEMATOLOGIC A:   Leukocytosis. P:  F/u CBC Sub Q heparin per NSG  INFECTIOUS A:   No evidence for infection. P:   Monitor clinically  ENDOCRINE A:   Sick euthyroid syndrome. P:   Monitor   Chesley Mires, MD [place] 02/08/2015, 7:45 AM Pager:  [phone] After 3pm call: [phone]

## 2015-02-09 DIAGNOSIS — I4891 Unspecified atrial fibrillation: Secondary | ICD-10-CM

## 2015-02-09 LAB — SODIUM
SODIUM: 144 mmol/L (ref 135–145)
Sodium: 146 mmol/L — ABNORMAL HIGH (ref 135–145)
Sodium: 145 mmol/L (ref 135–145)
Sodium: 145 mmol/L (ref 135–145)

## 2015-02-09 LAB — URINE CULTURE
Colony Count: NO GROWTH
Culture: NO GROWTH
Special Requests: NORMAL

## 2015-02-09 MED ORDER — DILTIAZEM HCL 60 MG PO TABS
60.0000 mg | ORAL_TABLET | Freq: Four times a day (QID) | ORAL | Status: DC
  Administered 2015-02-09 – 2015-02-14 (×21): 60 mg via ORAL
  Filled 2015-02-09 (×23): qty 1

## 2015-02-09 NOTE — Progress Notes (Signed)
STROKE TEAM PROGRESS NOTE   HISTORY Susan LAROQUE is an 80 y.o. female with a history of congestive heart failure, COPD, breast cancer, transient atrial fibrillation and hypertension, presenting with acute onset of ataxia with vertigo, nausea and blurred vision at 6 AM this morning. She has been on aspirin 81 mg most days. She has not been on anticoagulation. CT scan of her head showed a large right hemispheric cerebellar hematoma measuring 4.0 x 3.6 mm. There was rounding edema with significant mass effect on the fourth ventricle. There were no signs of hydrocephalus. NIH stroke score was 4.  LSN: 6 AM on 02/05/2015 tPA Given: No: Acute cerebellar hemorrhage   SUBJECTIVE (INTERVAL HISTORY) Daughters at the bedside. Patient feels better, her vision is improved.    OBJECTIVE Temp:  [98 F (36.7 C)-98.7 F (37.1 C)] 98.4 F (36.9 C) (06/08 0748) Pulse Rate:  [39-168] 97 (06/08 0900) Cardiac Rhythm:  [-] Atrial fibrillation (06/08 0800) Resp:  [15-24] 17 (06/08 0900) BP: (89-142)/(41-102) 139/90 mmHg (06/08 0900) SpO2:  [89 %-100 %] 98 % (06/08 0900)   Recent Labs Lab 02/05/15 1031  GLUCAP 145*    Recent Labs Lab 02/05/15 1016 02/05/15 1508 02/06/15 0934 02/07/15 0550  02/07/15 2240 02/08/15 0335 02/08/15 0905 02/08/15 1455 02/08/15 2137 02/09/15 0248 02/09/15 0900  NA 138 138 138 137  < > 143 142 144 144 146* 144 146*  K 3.6 3.8 3.9 4.0  --  3.1* 3.3*  --   --   --   --   --   CL 101  --  103 99*  --  102 102  --   --   --   --   --   CO2 28  --  27 29  --  32 31  --   --   --   --   --   GLUCOSE 174* 163* 124* 115*  --  111* 102*  --   --   --   --   --   BUN 16  --  10 10  --  8 6  --   --   --   --   --   CREATININE 0.63  --  0.53 0.53  --  0.53 0.45  --   --   --   --   --   CALCIUM 9.0  --  8.4* 8.4*  --  8.3* 8.3*  --   --   --   --   --   MG  --   --   --   --   --  2.0  --   --   --   --   --   --   < > = values in this interval not displayed.  Recent  Labs Lab 02/05/15 1016  AST 25  ALT 21  ALKPHOS 115  BILITOT 0.6  PROT 7.0  ALBUMIN 4.2    Recent Labs Lab 02/05/15 1016 02/05/15 1508 02/06/15 0934 02/07/15 0550 02/08/15 0335  WBC 12.4*  --  13.8* 12.2* 11.9*  NEUTROABS 9.7*  --   --   --   --   HGB 14.8 12.9 12.0 11.4* 12.0  HCT 44.9 38.0 36.4 34.7* 37.8  MCV 93.9  --  92.9 94.0 95.0  PLT 213  --  207 183 199    Recent Labs Lab 02/07/15 2240 02/08/15 0335 02/08/15 0905  TROPONINI <0.03 0.03 <0.03   No results for input(s): LABPROT, INR  in the last 72 hours.  Recent Labs  02/07/15 1500  COLORURINE YELLOW  LABSPEC 1.007  PHURINE 7.0  GLUCOSEU NEGATIVE  HGBUR NEGATIVE  BILIRUBINUR NEGATIVE  KETONESUR NEGATIVE  PROTEINUR NEGATIVE  UROBILINOGEN 0.2  NITRITE NEGATIVE  LEUKOCYTESUR NEGATIVE       Component Value Date/Time   CHOL 175 02/06/2015 0933   TRIG 117 02/06/2015 0933   HDL 51 02/06/2015 0933   CHOLHDL 3.4 02/06/2015 0933   VLDL 23 02/06/2015 0933   LDLCALC 101* 02/06/2015 0933   Lab Results  Component Value Date   HGBA1C 6.2* 02/06/2015   No results found for: LABOPIA, COCAINSCRNUR, LABBENZ, AMPHETMU, THCU, LABBARB  No results for input(s): ETH in the last 168 hours.   Ct Head Wo Contrast 02/05/2015    1) 4.0 cm acute hemorrhage within the right cerebellar hemisphere with surrounding edema and mass effect on the fourth ventricle. There is no hydrocephalus. The estimated volume is 25-30 mL. 2) Lacunar infarct of the right caudate head appears remote.  3) No acute supratentorial abnormality.    Ct Head Wo Contrast 02/06/2015    IMPRESSION: Post right occipital craniotomy for drainage of right cerebellar hematoma. Although there has been debulking of the central aspect of the hematoma, right cerebellar hematoma remains spanning over 4.1 x 3.5 cm versus prior 4.4 x 3.5 cm.  Surrounding vasogenic edema and mass effect have progressed slightly. Compression of the fourth ventricle has progressed  since the prior exam. Right cerebellar tonsil extension into the foramen magnum and upward herniation of the right cerebellum. Compression of the right aspect of the quadrigeminal plate has progressed slightly. Ventricular size including asymmetric mild prominence of the left temporal horn relatively similar to prior exam. Patient is at risk for development of hydrocephalus. Close follow-up recommended.     02/08/2015   IMPRESSION: Interval development of mild hydrocephalus post partial drainage of right cerebellar hematoma. Clinical correlation and close CT monitoring recommended.  Change in configuration of the right cerebellar hematoma at as noted above. Surrounding vasogenic edema and mass effect including downward mild herniation of the right cerebellar tonsil and moderate upward herniation of the right vermis and cerebellum with compression of the fourth ventricle, aqueduct, brainstem and quadrigeminal plate.    Dg Chest Portable 1 View 02/05/2015    No acute cardiopulmonary disease.   2D echo - - Procedure narrative: Transthoracic echocardiography. Imagequality was poor. The study was technically difficult, as aresult of poor acoustic windows, poor sound wave transmission,and restricted patient mobility. - Left ventricle: The cavity size was normal. Wall thickness wasincreased in a pattern of mild LVH. Systolic function wasvigorous. The estimated ejection fraction was in the range of 65%to 70%. Wall motion was normal; there were no regional wallmotion abnormalities. Doppler parameters are consistent withabnormal left ventricular relaxation (grade 1 diastolicdysfunction). The E/e&' ratio is between 8-15, suggestingindeterminate LV filling pressure. - Aortic valve: Poorly visualized. There was no stenosis. There wasno regurgitation. - Mitral valve: Calcified annulus. There was trivial regurgitation. - Left atrium: The atrium was normal in size. - Right atrium: The atrium was mildly  dilated.   PHYSICAL EXAM Temp:  [98 F (36.7 C)-98.7 F (37.1 C)] 98.4 F (36.9 C) (06/08 0748) Pulse Rate:  [39-168] 97 (06/08 0900) Resp:  [15-24] 17 (06/08 0900) BP: (89-142)/(41-102) 139/90 mmHg (06/08 0900) SpO2:  [89 %-100 %] 98 % (06/08 0900)  General - Well nourished, well developed, in no apparent distress.  Ophthalmologic - Fundi not visualized due to eye movement and  nystagmus.  Cardiovascular - Regular rate and rhythm.  Mental Status -  Level of arousal and orientation to time, place, and person were intact. Language including expression, naming, repetition, comprehension was assessed and found intact. Fund of Knowledge was assessed and was intact.  Cranial Nerves II - XII - II - Visual field intact OU. III, IV, VI - Extraocular movements showed mild right gaze difficulty with mild right CN VI palsy. V - Facial sensation intact bilaterally. VII - right lower facial droop VIII - Hearing & vestibular intact bilaterally, horizontal nystagmus on right side gaze, rotational nystagmus on upward and downward gaze. X - Palate elevates symmetrically. XI - Chin turning & shoulder shrug intact bilaterally. XII - Tongue protrusion intact.  Motor Strength - The patient's strength was normal in all extremities and pronator drift was absent.  Bulk was normal and fasciculations were absent.   Motor Tone - Muscle tone was assessed at the neck and appendages and was normal.  Reflexes - The patient's reflexes were 1+ in all extremities and she had no pathological reflexes.  Sensory - Light touch, temperature/pinprick were assessed and were symmetrical.    Coordination - The patient had ataxia on the right FTN and HTS, intact on the left.  Tremor was absent.  Gait and Station - not tested due to safety concerns.   ASSESSMENT/PLAN [name] is a 80 y.o. female with history of COPD, breast cancer on remission, bladder cancer s/p chemotherapy and surgery, paroxysmal A.  fib on aspirin and the CHF presenting with ataxia, vertigo and nausea vomiting. CT showed right cerebellar ICH. She received right cerebellar hematoma evacuation.     Right cerebellar ICH:  S/p hematoma evacuation. Etiology not clear, HTN, vs. CAA vs. AVM vs. Brain mets. Patient blood pressure well controlled at home and after admission, and malignancy also in remission. Will need follow-up MRI, however patient reluctant to do any MRI.  Resultant  diplopia, nystagmus, ataxia. Overall neuro staus improved.  CT showed right cerebellar hemorrhage  Repeat CT showed mild hydrocephalus  2D Echo unremarkable  LDL 101  HgbA1c 6.2  Heparin subcutaneous for VTE prophylaxis Diet regular Room service appropriate?: Yes; Fluid consistency:: Thin  aspirin 81 mg orally every day prior to admission, now on no antithrombotic  Ongoing aggressive stroke risk factor management  Therapy recommendations:  CIR. Consult is pending  Disposition:  Pending  Cerebellar edema, induced hypernatremia   Due to cerebellar hematoma  Put on 3% saline to decrease cerebral edema  Goal Na 150-160  Na Q6  Sodium today 144 - increased 3% saline to 75 cc/ hour  Close watch for hydrocephalus  Paroxysmal Afib with RVR  Cardiology consulted  Lisinopril switched to metoprolol home dose  On IV Cardizem  Rate controlled  On aspirin 81 at home  Patient is likely not anticoagulation candidate due to spontaneous cerebellar hemorrhage.   Hypertension  BP stable  Pt denies hx of HTN  Lisinopril switched to metoprolol  Hyperlipidemia  LDL 101, goal < 70  Add Lipitor 10  Continue statin at discharge  Other Stroke Risk Factors  Advanced age  Former smoker for 40 years  Breast cancer in remission  Bladder cancer were treated last December  CHF  Other Active Problems  Leukocytosis  Hospital day # 4   Personally examined patient and images, and have documentes history, physical, neuro  exam,assessment and plan as stated above.   Sarina Ill, MD Stroke Neurology [phone] [facility]  A total of 35 minutes was spent face-to-face with this patient. Over half this time was spent on counseling patient on the stroke diagnosis and different diagnostic and therapeutic options available.         To contact Stroke Continuity provider, please refer to [email or web address] After hours, contact General Neurology

## 2015-02-09 NOTE — Progress Notes (Addendum)
Pt seen and examined. No issues overnight. Pt reports slightly improving double vision and coordination. No HA.  EXAM: Temp:  [98 F (36.7 C)-98.7 F (37.1 C)] 98.4 F (36.9 C) (06/08 0748) Pulse Rate:  [39-168] 97 (06/08 0900) Resp:  [15-24] 17 (06/08 0900) BP: (89-142)/(41-102) 139/90 mmHg (06/08 0900) SpO2:  [93 %-100 %] 98 % (06/08 0900) Intake/Output      06/07 0701 - 06/08 0700 06/08 0701 - 06/09 0700   P.O. 360    I.V. (mL/kg) 1845.2 (24.9) 212 (2.9)   Total Intake(mL/kg) 2205.2 (29.7) 212 (2.9)   Urine (mL/kg/hr) 2325 (1.3)    Total Output 2325     Net -119.8 +212        Urine Occurrence  1 x   Stool Occurrence  1 x    Awake, alert, oriented Speech fluent Good strength Wound c/d/i  LABS: Lab Results  Component Value Date   CREATININE 0.45 02/08/2015   BUN 6 02/08/2015   NA 146* 02/09/2015   K 3.3* 02/08/2015   CL 102 02/08/2015   CO2 31 02/08/2015   Lab Results  Component Value Date   WBC 11.9* 02/08/2015   HGB 12.0 02/08/2015   HCT 37.8 02/08/2015   MCV 95.0 02/08/2015   PLT 199 02/08/2015    IMPRESSION: - 80 y.o. female with right cerebellar hematoma and mild development of HCP on CT, but good neurologic exam. - Afib  PLAN: - Cont expectant observation, doesn't need EVD right now. - Increase 3% saline - Cont therapy - Cardizem gtt for afib

## 2015-02-09 NOTE — Progress Notes (Signed)
Patient Profile: 80 year old mild to moderately overweight Caucasian female who was admitted on 02/05/15 with a cerebellar hemorrhage. She has a history of hypertension in the past. She has COPD and history of breast cancer. She's reports a remote history of PAF in the setting of knee replacement which spontaneously converted. Her current hospitalization has been complicated by atrial fibrillation w/ RVR.   Subjective: No complaints. Denies palpitations. No dyspnea.   Objective: Vital signs in last 24 hours: Temp:  [98 F (36.7 C)-98.7 F (37.1 C)] 98.4 F (36.9 C) (06/08 0748) Pulse Rate:  [39-168] 97 (06/08 0900) Resp:  [15-24] 17 (06/08 0900) BP: (89-142)/(41-102) 139/90 mmHg (06/08 0900) SpO2:  [93 %-100 %] 98 % (06/08 0900) Last BM Date: 02/09/15  Intake/Output from previous day: 06/07 0701 - 06/08 0700 In: 2205.2 [P.O.:360; I.V.:1845.2] Out: 2325 [Urine:2325] Intake/Output this shift: Total I/O In: 212 [I.V.:212] Out: -   Medications Current Facility-Administered Medications  Medication Dose Route Frequency Provider Last Rate Last Dose  . acetaminophen (TYLENOL) tablet 650 mg  650 mg Oral Q4H PRN Wallie Char   650 mg at 02/08/15 2226   Or  . acetaminophen (TYLENOL) suppository 650 mg  650 mg Rectal Q4H PRN Wallie Char      . antiseptic oral rinse (CPC / CETYLPYRIDINIUM CHLORIDE 0.05%) solution 7 mL  7 mL Mouth Rinse BID Consuella Lose, MD   7 mL at 02/09/15 1000  . atorvastatin (LIPITOR) tablet 10 mg  10 mg Oral q1800 Rosalin Hawking, MD   10 mg at 02/08/15 1817  . bisacodyl (DULCOLAX) suppository 10 mg  10 mg Rectal Daily PRN Consuella Lose, MD      . diltiazem (CARDIZEM) 100 mg in dextrose 5 % 100 mL (1 mg/mL) infusion  2.5-15 mg/hr Intravenous Titrated Anders Simmonds, MD 7 mL/hr at 02/09/15 0900 7 mg/hr at 02/09/15 0900  . docusate sodium (COLACE) capsule 100 mg  100 mg Oral BID Consuella Lose, MD   100 mg at 02/08/15 2144  . heparin injection 5,000  Units  5,000 Units Subcutaneous 3 times per day Consuella Lose, MD   5,000 Units at 02/09/15 0554  . labetalol (NORMODYNE,TRANDATE) injection 10-40 mg  10-40 mg Intravenous Q10 min PRN Wallie Char   20 mg at 02/07/15 1504  . latanoprost (XALATAN) 0.005 % ophthalmic solution 1 drop  1 drop Both Eyes QHS Consuella Lose, MD   1 drop at 02/08/15 2145  . levalbuterol (XOPENEX) nebulizer solution 0.63 mg  0.63 mg Nebulization Q4H PRN Consuella Lose, MD      . metoprolol tartrate (LOPRESSOR) tablet 12.5 mg  12.5 mg Oral BID Rosalin Hawking, MD   12.5 mg at 02/09/15 0907  . morphine 2 MG/ML injection 1-2 mg  1-2 mg Intravenous Q2H PRN Consuella Lose, MD      . ondansetron (ZOFRAN) tablet 4 mg  4 mg Oral Q4H PRN Consuella Lose, MD       Or  . ondansetron (ZOFRAN) injection 4 mg  4 mg Intravenous Q4H PRN Consuella Lose, MD   4 mg at 02/06/15 0151  . pantoprazole (PROTONIX) EC tablet 40 mg  40 mg Oral Daily Consuella Lose, MD   40 mg at 02/09/15 0907  . promethazine (PHENERGAN) tablet 12.5-25 mg  12.5-25 mg Oral Q4H PRN Consuella Lose, MD      . senna (SENOKOT) tablet 8.6 mg  1 tablet Oral BID Consuella Lose, MD   8.6 mg at 02/08/15 2144  . senna-docusate (Senokot-S) tablet 1  tablet  1 tablet Oral BID Wallie Char   1 tablet at 02/07/15 2211  . sodium chloride (hypertonic) 3 % solution   Intravenous Continuous Amie Portland, MD 100 mL/hr at 02/09/15 0900      PE: General appearance: alert, cooperative and no distress Neck: no carotid bruit and no JVD Lungs: clear to auscultation bilaterally Heart: irregularly irregular rhythm Extremities: no LEE Pulses: 2+ and symmetric Skin: warm and dry Neurologic: Grossly normal  Lab Results:   Recent Labs  02/07/15 0550 02/08/15 0335  WBC 12.2* 11.9*  HGB 11.4* 12.0  HCT 34.7* 37.8  PLT 183 199   BMET  Recent Labs  02/07/15 0550  02/07/15 2240 02/08/15 0335  02/08/15 2137 02/09/15 0248 02/09/15 0900  NA 137  < >  143 142  < > 146* 144 146*  K 4.0  --  3.1* 3.3*  --   --   --   --   CL 99*  --  102 102  --   --   --   --   CO2 29  --  32 31  --   --   --   --   GLUCOSE 115*  --  111* 102*  --   --   --   --   BUN 10  --  8 6  --   --   --   --   CREATININE 0.53  --  0.53 0.45  --   --   --   --   CALCIUM 8.4*  --  8.3* 8.3*  --   --   --   --   < > = values in this interval not displayed.  Cardiac Panel (last 3 results)  Recent Labs  02/07/15 2240 02/08/15 0335 02/08/15 0905  TROPONINI <0.03 0.03 <0.03    Studies/Results: 2D echo 02/07/15 Study Conclusions  - Procedure narrative: Transthoracic echocardiography. Image quality was poor. The study was technically difficult, as a result of poor acoustic windows, poor sound wave transmission, and restricted patient mobility. - Left ventricle: The cavity size was normal. Wall thickness was increased in a pattern of mild LVH. Systolic function was vigorous. The estimated ejection fraction was in the range of 65% to 70%. Wall motion was normal; there were no regional wall motion abnormalities. Doppler parameters are consistent with abnormal left ventricular relaxation (grade 1 diastolic dysfunction). The E/e&' ratio is between 8-15, suggesting indeterminate LV filling pressure. - Aortic valve: Poorly visualized. There was no stenosis. There was no regurgitation. - Mitral valve: Calcified annulus. There was trivial regurgitation. - Left atrium: The atrium was normal in size. - Right atrium: The atrium was mildly dilated.  Impressions:  - Although difficult to compare, there do not appear to be significant changes compared to the echo in 2010.   Assessment/Plan  Active Problems:   Cerebellar hemorrhage   Acute cerebellar hemorrhage   Hypokalemia   Chronic combined systolic and diastolic congestive heart failure   Atrial fibrillation with RVR   HLD (hyperlipidemia)   Obstructive hydrocephalus   1. PAF: in  the setting of acute neurologic illness. 2D echo is normal. She remains in afib but rate is well controlled in the 80s. Continue Cardizem and metoprolol for rate control. Given her hemorrhage, she is not a candidate for systemic anticoagulation. Continue to monitor closely on telemetry.     LOS: 4 days    Brittainy M. Ladoris Gene 02/09/2015 10:42 AM  Personally seen and examined. Agree  with above. AFib reasonable rate control.  Will change to PO diltiazem. 60 Q6 Low dose metoprolol 12.5 BID No anticoagulation with ICH.  Hopefully AFIB may convert as she heals.   Candee Furbish, MD

## 2015-02-09 NOTE — Progress Notes (Signed)
Cardiology consulted yesterday and now managing A fib.  No other active management issues for PCCM.  Will sign off.  Please call if additional help needed.  Chesley Mires, MD [facility] Pulmonary/Critical Care 02/09/2015, 6:46 AM Pager:  [phone] After 3pm call: [phone]

## 2015-02-10 ENCOUNTER — Inpatient Hospital Stay (HOSPITAL_COMMUNITY): Payer: Medicare Other

## 2015-02-10 LAB — SODIUM
SODIUM: 145 mmol/L (ref 135–145)
SODIUM: 146 mmol/L — AB (ref 135–145)
Sodium: 147 mmol/L — ABNORMAL HIGH (ref 135–145)
Sodium: 145 mmol/L (ref 135–145)

## 2015-02-10 LAB — POTASSIUM: Potassium: 2.9 mmol/L — ABNORMAL LOW (ref 3.5–5.1)

## 2015-02-10 MED ORDER — POTASSIUM CHLORIDE CRYS ER 20 MEQ PO TBCR
40.0000 meq | EXTENDED_RELEASE_TABLET | Freq: Once | ORAL | Status: AC
  Administered 2015-02-10: 40 meq via ORAL
  Filled 2015-02-10: qty 2

## 2015-02-10 MED ORDER — METOPROLOL TARTRATE 12.5 MG HALF TABLET
12.5000 mg | ORAL_TABLET | Freq: Once | ORAL | Status: AC
  Administered 2015-02-10: 12.5 mg via ORAL
  Filled 2015-02-10: qty 1

## 2015-02-10 MED ORDER — METOPROLOL TARTRATE 25 MG PO TABS
25.0000 mg | ORAL_TABLET | Freq: Three times a day (TID) | ORAL | Status: DC
  Administered 2015-02-10 – 2015-02-11 (×3): 25 mg via ORAL
  Filled 2015-02-10 (×5): qty 1

## 2015-02-10 NOTE — Progress Notes (Signed)
Pt seen and examined. No issues overnight. Pt reports continued improvement in double vision. No HA.  EXAM: Temp:  [98.1 F (36.7 C)-98.6 F (37 C)] 98.2 F (36.8 C) (06/09 0343) Pulse Rate:  [72-118] 103 (06/09 0600) Resp:  [14-31] 24 (06/09 0600) BP: (104-156)/(52-109) 150/88 mmHg (06/09 0600) SpO2:  [94 %-100 %] 99 % (06/09 0600) Intake/Output      06/08 0701 - 06/09 0700 06/09 0701 - 06/10 0700   P.O. 720    I.V. (mL/kg) 2233 (30.1)    Total Intake(mL/kg) 2953 (39.8)    Urine (mL/kg/hr) 5025 (2.8)    Stool 0 (0)    Total Output 5025     Net -2072          Urine Occurrence 1 x    Stool Occurrence 1 x     Awake, alert, oriented Speech fluent CN grossly intact MAE well Wound c/d/i, no leak  LABS: Lab Results  Component Value Date   CREATININE 0.45 02/08/2015   BUN 6 02/08/2015   NA 147* 02/10/2015   K 3.3* 02/08/2015   CL 102 02/08/2015   CO2 31 02/08/2015   Lab Results  Component Value Date   WBC 11.9* 02/08/2015   HGB 12.0 02/08/2015   HCT 37.8 02/08/2015   MCV 95.0 02/08/2015   PLT 199 02/08/2015    IMPRESSION: - 80 y.o. female with right cerebellar hemorrhage, remains neurologically well.  PLAN: - Cont 3% for today. If she remains stable, can likely wean or d/c tomorrow - Cont efforts at mobilization - On PO cardizem and metoprolol for Afib/RVR

## 2015-02-10 NOTE — Progress Notes (Signed)
Occupational Therapy Treatment Patient Details Name: [name] MRN: [id number] DOB: November 26, 1934 Today's Date: 02/10/2015    History of present illness [name] is an 80 y.o. female with a history of congestive heart failure, COPD, breast cancer, transient atrial fibrillation and hypertension, presenting with acute onset of ataxia with vertigo, nausea and blurred vision at 6 AM this morning. She has been on aspirin 81 mg most days. He has not been on anticoagulation. CT scan of her head showed a large right hemispheric cerebellar hematoma measuring 4.0 x 3.6 mm. There was rounding edema with significant mass effect on the fourth ventricle. There were no signs of hydrocephalus. NIH stroke score was 4.   OT comments  Pt with improving functional mobility.  She requires mod A +2 for functional transfers due to ataxia.  She reports vision is better with less diplopia, but nystagmus present with all movements and noticeable dysconjugate gaze   Follow Up Recommendations  CIR;Supervision/Assistance - 24 hour    Equipment Recommendations  3 in 1 bedside comode;Wheelchair (measurements OT)    Recommendations for Other Services Rehab consult    Precautions / Restrictions Precautions Precautions: Fall Restrictions Weight Bearing Restrictions: No       Mobility Bed Mobility Overal bed mobility: Needs Assistance Bed Mobility: Rolling;Sidelying to Sit Rolling: Min guard Sidelying to sit: Min assist       General bed mobility comments: truncal control to allow pt to use R UE to push up from R to L.  Transfers Overall transfer level: Needs assistance Equipment used: 2 person hand held assist Transfers: Sit to/from [facility] Sit to Stand: Mod assist;+2 physical assistance Stand pivot transfers: Mod assist;+2 physical assistance       General transfer comment: significant truncal stability assist to help control for ataxia when moving her feet.     Balance Overall balance assessment: Needs assistance Sitting-balance support: Feet supported Sitting balance-Leahy Scale: Fair Sitting balance - Comments: no assist needed. worked on [employer] support: Bilateral upper extremity supported Standing balance-Leahy Scale: Poor Standing balance comment: Stood > 10 min working on truncal control, upright midline stance and w/shift control for stepping and pivoting with 2 person facilitation                   ADL                           Toilet Transfer: Moderate assistance;+2 for physical assistance;Stand-pivot;Ambulation;BSC Toilet Transfer Details (indicate cue type and reason): see comments below under transfer status                   Vision Eye Alignment: Impaired (comment)           Diplopia Assessment: Disappears with one eye closed;Objects split side to side   Additional Comments: Pt reports diplopia is improved.  She denies dizziness when asked, but later spontaneously, she will say that she is dizzy.  She has nystagmus with all eye movements and dysconjugate gaze with    Perception     Praxis      Cognition   Behavior During Therapy: Flat affect Overall Cognitive Status: Within Functional Limits for tasks assessed Area of Impairment: Safety/judgement;Memory;Attention;Awareness;Problem solving   Current Attention Level: Sustained Memory: Decreased short-term memory    Safety/Judgement: Decreased awareness of safety;Decreased awareness of deficits Awareness: Intellectual Problem Solving: Slow processing;Decreased initiation;Difficulty sequencing;Requires verbal cues;Requires tactile cues  Extremity/Trunk Assessment               Exercises     Shoulder Instructions       General Comments      Pertinent Vitals/ Pain       Pain Assessment: No/denies pain  Home Living                                          Prior  Functioning/Environment              Frequency Min 3X/week     Progress Toward Goals  OT Goals(current goals can now be found in the care plan section)     Acute Rehab OT Goals Patient Stated Goal: regain independence  ADL Goals Pt Will Perform Eating: with modified independence;sitting Pt Will Perform Grooming: with supervision;sitting Pt Will Perform Upper Body Bathing: with supervision;sitting Pt Will Perform Lower Body Bathing: with mod assist;sit to/from stand Pt Will Perform Upper Body Dressing: with supervision;sitting Pt Will Perform Lower Body Dressing: with mod assist;sit to/from stand Pt Will Transfer to Toilet: with mod assist;stand pivot transfer;bedside commode Pt Will Perform Toileting - Clothing Manipulation and hygiene: with mod assist;sit to/from stand Additional ADL Goal #1: Pt will be independent with HEP for vision   Plan Discharge plan remains appropriate    Co-evaluation    PT/OT/SLP Co-Evaluation/Treatment: Yes Reason for Co-Treatment: Complexity of the patient's impairments (multi-system involvement);For patient/therapist safety PT goals addressed during session: Mobility/safety with mobility OT goals addressed during session: ADL's and self-care      End of Session     Activity Tolerance Patient tolerated treatment well   Patient Left in chair;with call bell/phone within reach;with family/visitor present   Nurse Communication Mobility status;Need for lift equipment        Time: 3818-4037 OT Time Calculation (min): 39 min  Charges: OT General Charges $OT Visit: 1 Procedure OT Treatments $Neuromuscular Re-education: 23-37 mins  Emmauel Hallums M 02/10/2015, 2:17 PM

## 2015-02-10 NOTE — Progress Notes (Signed)
STROKE TEAM PROGRESS NOTE   HISTORY [name] is an 80 y.o. female with a history of congestive heart failure, COPD, breast cancer, transient atrial fibrillation and hypertension, presenting with acute onset of ataxia with vertigo, nausea and blurred vision at 6 AM this morning. She has been on aspirin 81 mg most days. She has not been on anticoagulation. CT scan of her head showed a large right hemispheric cerebellar hematoma measuring 4.0 x 3.6 mm. There was rounding edema with significant mass effect on the fourth ventricle. There were no signs of hydrocephalus. NIH stroke score was 4.  LSN: 6 AM on 02/05/2015 tPA Given: No: Acute cerebellar hemorrhage   SUBJECTIVE (INTERVAL HISTORY) Daughters at the bedside. Patient feels OK, her diplopia is improving. No headache. Mental status stable.   OBJECTIVE Temp:  [98.1 F (36.7 C)-98.8 F (37.1 C)] 98.6 F (37 C) (06/09 1135) Pulse Rate:  [59-114] 100 (06/09 1555) Cardiac Rhythm:  [-] Atrial fibrillation (06/09 0800) Resp:  [16-31] 16 (06/09 1555) BP: (87-156)/(52-109) 127/71 mmHg (06/09 1555) SpO2:  [92 %-99 %] 95 % (06/09 1555)   Recent Labs Lab 02/05/15 1031  GLUCAP 145*    Recent Labs Lab 02/05/15 1016 02/05/15 1508 02/06/15 0934 02/07/15 0550  02/07/15 2240 02/08/15 0335  02/09/15 1630 02/09/15 2040 02/10/15 0245 02/10/15 1120 02/10/15 1600  NA 138 138 138 137  < > 143 142  < > 145 145 147* 145 146*  K 3.6 3.8 3.9 4.0  --  3.1* 3.3*  --   --   --   --   --  2.9*  CL 101  --  103 99*  --  102 102  --   --   --   --   --   --   CO2 28  --  27 29  --  32 31  --   --   --   --   --   --   GLUCOSE 174* 163* 124* 115*  --  111* 102*  --   --   --   --   --   --   BUN 16  --  10 10  --  8 6  --   --   --   --   --   --   CREATININE 0.63  --  0.53 0.53  --  0.53 0.45  --   --   --   --   --   --   CALCIUM 9.0  --  8.4* 8.4*  --  8.3* 8.3*  --   --   --   --   --   --   MG  --   --   --   --   --  2.0  --   --   --    --   --   --   --   < > = values in this interval not displayed.  Recent Labs Lab 02/05/15 1016  AST 25  ALT 21  ALKPHOS 115  BILITOT 0.6  PROT 7.0  ALBUMIN 4.2    Recent Labs Lab 02/05/15 1016 02/05/15 1508 02/06/15 0934 02/07/15 0550 02/08/15 0335  WBC 12.4*  --  13.8* 12.2* 11.9*  NEUTROABS 9.7*  --   --   --   --   HGB 14.8 12.9 12.0 11.4* 12.0  HCT 44.9 38.0 36.4 34.7* 37.8  MCV 93.9  --  92.9 94.0 95.0  PLT 213  --  [place] 02/07/15 2240 02/08/15 0335 02/08/15 0905  TROPONINI <0.03 0.03 <0.03   No results for input(s): LABPROT, INR in the last 72 hours. No results for input(s): COLORURINE, LABSPEC, [place], GLUCOSEU, HGBUR, BILIRUBINUR, KETONESUR, PROTEINUR, UROBILINOGEN, NITRITE, LEUKOCYTESUR in the last 72 hours.  Invalid input(s): APPERANCEUR     Component Value Date/Time   CHOL 175 02/06/2015 0933   TRIG 117 02/06/2015 0933   HDL 51 02/06/2015 0933   CHOLHDL 3.4 02/06/2015 0933   VLDL 23 02/06/2015 0933   LDLCALC 101* 02/06/2015 0933   Lab Results  Component Value Date   HGBA1C 6.2* 02/06/2015   No results found for: LABOPIA, COCAINSCRNUR, LABBENZ, AMPHETMU, THCU, LABBARB  No results for input(s): ETH in the last 168 hours.  I have personally reviewed the radiological images below and agree with the radiology interpretations.  Ct Head Wo Contrast 02/05/2015    1) 4.0 cm acute hemorrhage within the right cerebellar hemisphere with surrounding edema and mass effect on the fourth ventricle. There is no hydrocephalus. The estimated volume is 25-30 mL. 2) Lacunar infarct of the right caudate head appears remote.  3) No acute supratentorial abnormality.    Ct Head Wo Contrast 02/06/2015    IMPRESSION: Post right occipital craniotomy for drainage of right cerebellar hematoma. Although there has been debulking of the central aspect of the hematoma, right cerebellar hematoma remains spanning over 4.1 x 3.5 cm versus prior 4.4 x  3.5 cm.  Surrounding vasogenic edema and mass effect have progressed slightly. Compression of the fourth ventricle has progressed since the prior exam. Right cerebellar tonsil extension into the foramen magnum and upward herniation of the right cerebellum. Compression of the right aspect of the quadrigeminal plate has progressed slightly. Ventricular size including asymmetric mild prominence of the left temporal horn relatively similar to prior exam. Patient is at risk for development of hydrocephalus. Close follow-up recommended.     02/08/2015   IMPRESSION: Interval development of mild hydrocephalus post partial drainage of right cerebellar hematoma. Clinical correlation and close CT monitoring recommended.  Change in configuration of the right cerebellar hematoma at as noted above. Surrounding vasogenic edema and mass effect including downward mild herniation of the right cerebellar tonsil and moderate upward herniation of the right vermis and cerebellum with compression of the fourth ventricle, aqueduct, brainstem and quadrigeminal plate.    02/10/2015   IMPRESSION: Right cerebellar hematoma is slightly larger compared to the prior study. There is mass-effect on the fourth ventricle and brainstem and mild hydrocephalus which is stable.     Dg Chest Portable 1 View 02/05/2015    No acute cardiopulmonary disease.   2D echo - - Procedure narrative: Transthoracic echocardiography. Imagequality was poor. The study was technically difficult, as aresult of poor acoustic windows, poor sound wave transmission,and restricted patient mobility. - Left ventricle: The cavity size was normal. Wall thickness wasincreased in a pattern of mild LVH. Systolic function wasvigorous. The estimated ejection fraction was in the range of 65%to 70%. Wall motion was normal; there were no regional wallmotion abnormalities. Doppler parameters are consistent withabnormal left ventricular relaxation (grade 1  diastolicdysfunction). The E/e&' ratio is between 8-15, suggestingindeterminate LV filling pressure. - Aortic valve: Poorly visualized. There was no stenosis. There wasno regurgitation. - Mitral valve: Calcified annulus. There was trivial regurgitation. - Left atrium: The atrium was normal in size. - Right atrium: The atrium was mildly dilated.   PHYSICAL EXAM Temp:  [98.1 F (36.7 C)-98.8  F (37.1 C)] 98.6 F (37 C) (06/09 1135) Pulse Rate:  [59-114] 100 (06/09 1555) Resp:  [16-31] 16 (06/09 1555) BP: (87-156)/(52-109) 127/71 mmHg (06/09 1555) SpO2:  [92 %-99 %] 95 % (06/09 1555)  General - Well nourished, well developed, in no apparent distress.  Ophthalmologic - Fundi not visualized due to eye movement and nystagmus.  Cardiovascular - Regular rate and rhythm.  Mental Status -  Level of arousal and orientation to time, place, and person were intact. Language including expression, naming, repetition, comprehension was assessed and found intact. Fund of Knowledge was assessed and was intact.  Cranial Nerves II - XII - II - Visual field intact OU. III, IV, VI - Extraocular movements showed mild right gaze difficulty with mild right CN VI palsy. V - Facial sensation intact bilaterally. VII - right lower facial droop VIII - Hearing & vestibular intact bilaterally, horizontal nystagmus on right side gaze, rotational nystagmus on upward and downward gaze. X - Palate elevates symmetrically. XI - Chin turning & shoulder shrug intact bilaterally. XII - Tongue protrusion intact.  Motor Strength - The patient's strength was normal in all extremities and pronator drift was absent.  Bulk was normal and fasciculations were absent.   Motor Tone - Muscle tone was assessed at the neck and appendages and was normal.  Reflexes - The patient's reflexes were 1+ in all extremities and she had no pathological reflexes.  Sensory - Light touch, temperature/pinprick were assessed and were  symmetrical.    Coordination - The patient had ataxia on the right FTN and HTS, intact on the left.  Tremor was absent.  Gait and Station - not tested due to safety concerns.   ASSESSMENT/PLAN [name] is a 80 y.o. female with history of COPD, breast cancer on remission, bladder cancer s/p chemotherapy and surgery, paroxysmal A. fib on aspirin and the CHF presenting with ataxia, vertigo and nausea vomiting. CT showed right cerebellar ICH. She received right cerebellar hematoma evacuation.     Right cerebellar ICH:  S/p hematoma evacuation. Etiology not clear, HTN, vs. CAA vs. AVM vs. Brain mets. Patient blood pressure well controlled at home and after admission, and malignancy also in remission. Will need follow-up MRI, however patient reluctant to do any MRI.  Resultant  diplopia, nystagmus, ataxia. Overall neuro staus improved.  CT showed right cerebellar hemorrhage  Repeat CT showed mild hydrocephalus but stable  2D Echo unremarkable  LDL 101  HgbA1c 6.2  Heparin subcutaneous for VTE prophylaxis Diet regular Room service appropriate?: Yes; Fluid consistency:: Thin  aspirin 81 mg orally every day prior to admission, now on no antithrombotic  Ongoing aggressive stroke risk factor management  Therapy recommendations:  CIR  Disposition:  Pending  Cerebellar edema, induced hypernatremia   Due to cerebellar hematoma  Put on 3% saline to decrease cerebral edema  Goal Na 150-160  Na Q6  Sodium today 147 - increased 3% saline to 100 cc/ hour  Close watch for hydrocephalus  Paroxysmal Afib with RVR  Cardiology on board  On metoprolol and cardizem  Rate controlled  On aspirin 81 at home  Patient is not anticoagulation candidate due to spontaneous cerebellar hemorrhage.   Hypertension  BP stable 110-150  Pt denies hx of HTN  Lisinopril switched to metoprolol  Currently metoprolol 19m tid  Hyperlipidemia  LDL 101, goal < 70  Add Lipitor  10  Continue statin at discharge  Other Stroke Risk Factors  Advanced age  Former smoker for 4109  years  Breast cancer in remission  Bladder cancer were treated last December  CHF  Other Active Problems  Leukocytosis  Hospital day # 5   This patient is critically ill due to right cerebellar ICH s/p evacuation, A. fib with RVR, mild hydrocephalus and at significant risk of neurological worsening, death form cerebellar edema, transtentorial herniation, obstructive hydrocephalus and respiratory failure. This patient's care requires constant monitoring of vital signs, hemodynamics, respiratory and cardiac monitoring, review of multiple databases, neurological assessment, discussion with family, other specialists and medical decision making of high complexity. I spent 35 minutes of neurocritical care time in the care of this patient.  Rosalin Hawking, MD PhD Stroke Neurology 02/10/2015 4:54 PM   To contact Stroke Continuity provider, please refer to [email or web address] After hours, contact General Neurology

## 2015-02-10 NOTE — Progress Notes (Addendum)
Physical Therapy Treatment Patient Details Name: [name] MRN: [id number] DOB: 1935-07-29 Today's Date: 02/10/2015    History of Present Illness [name] is an 80 y.o. female with a history of congestive heart failure, COPD, breast cancer, transient atrial fibrillation and hypertension, presenting with acute onset of ataxia with vertigo, nausea and blurred vision at 6 AM this morning. She has been on aspirin 81 mg most days. He has not been on anticoagulation. CT scan of her head showed a large right hemispheric cerebellar hematoma measuring 4.0 x 3.6 mm. There was rounding edema with significant mass effect on the fourth ventricle. There were no signs of hydrocephalus. NIH stroke score was 4.    PT Comments    Progressing well, but pt having difficulty with control of any movement.  Emphasis on standing balance with mobility in standing.  Follow Up Recommendations  CIR     Equipment Recommendations  Other (comment) (TBA)    Recommendations for Other Services Rehab consult     Precautions / Restrictions Precautions Precautions: Fall Restrictions Weight Bearing Restrictions: No    Mobility  Bed Mobility Overal bed mobility: Needs Assistance Bed Mobility: Rolling;Sidelying to Sit Rolling: Min guard Sidelying to sit: Min assist       General bed mobility comments: truncal control to allow pt to use R UE to push up from R to L.  Transfers Overall transfer level: Needs assistance Equipment used: 2 person hand held assist Transfers: Sit to/from [facility] Sit to Stand: Mod assist;+2 physical assistance Stand pivot transfers: Mod assist;+2 physical assistance       General transfer comment: significant truncal stability assist to help control for ataxia when moving her feet.  Ambulation/Gait             General Gait Details: not tested   Stairs            Wheelchair Mobility    Modified Rankin (Stroke Patients  Only) Modified Rankin (Stroke Patients Only) Pre-Morbid Rankin Score: No symptoms Modified Rankin: Severe disability     Balance Overall balance assessment: Needs assistance Sitting-balance support: Feet supported Sitting balance-Leahy Scale: Fair Sitting balance - Comments: no assist needed. worked on [employer] support: Bilateral upper extremity supported Standing balance-Leahy Scale: Poor Standing balance comment: Stood > 10 min working on truncal control, upright midline stance and w/shift control for stepping and pivoting with 2 person facilitation                    Cognition Arousal/Alertness: Awake/alert Behavior During Therapy: Flat affect Overall Cognitive Status: Within Functional Limits for tasks assessed Area of Impairment: Safety/judgement;Memory;Attention;Awareness;Problem solving   Current Attention Level: Sustained Memory: Decreased short-term memory   Safety/Judgement: Decreased awareness of safety;Decreased awareness of deficits Awareness: Intellectual Problem Solving: Slow processing;Decreased initiation;Difficulty sequencing;Requires verbal cues;Requires tactile cues      Exercises      General Comments General comments (skin integrity, edema, etc.): EHR rose from 120's through 30's and maxed in area of 169.  Further treament deferred.      Pertinent Vitals/Pain Pain Assessment: No/denies pain    Home Living                      Prior Function            PT Goals (current goals can now be found in the care plan section) Acute Rehab PT Goals Patient Stated Goal: regain independence  PT Goal  Formulation: With patient Time For Goal Achievement: 02/21/15 Potential to Achieve Goals: Good Progress towards PT goals: Progressing toward goals    Frequency  Min 4X/week    PT Plan Frequency needs to be updated;Discharge plan needs to be updated    Co-evaluation PT/OT/SLP Co-Evaluation/Treatment: Yes Reason for  Co-Treatment: Complexity of the patient's impairments (multi-system involvement);For patient/therapist safety PT goals addressed during session: Mobility/safety with mobility OT goals addressed during session: ADL's and self-care     End of Session Equipment Utilized During Treatment: Oxygen Activity Tolerance: Patient tolerated treatment well;Patient limited by fatigue Patient left: with call bell/phone within reach;in bed;with family/visitor present     Time: 1216-1255 PT Time Calculation (min) (ACUTE ONLY): 39 min  Charges:  $Therapeutic Activity: 8-22 mins                    G Codes:      Macyn Shropshire, Tessie Fass 02/10/2015, 2:15 PM 02/10/2015  Donnella Sham, PT [phone]  (pager)

## 2015-02-10 NOTE — Progress Notes (Signed)
Subjective: No complaints.   Objective: Vital signs in last 24 hours: Temp:  [98.1 F (36.7 C)-98.6 F (37 C)] 98.2 F (36.8 C) (06/09 0343) Pulse Rate:  [72-118] 109 (06/09 0900) Resp:  [16-31] 18 (06/09 0900) BP: (105-156)/(52-109) 114/64 mmHg (06/09 0900) SpO2:  [94 %-100 %] 96 % (06/09 0900) Last BM Date: 02/09/15  Intake/Output from previous day: 06/08 0701 - 06/09 0700 In: 2953 [P.O.:720; I.V.:2233] Out: 5025 [Urine:5025] Intake/Output this shift: Total I/O In: 400 [I.V.:400] Out: 700 [Urine:700]  Medications Scheduled Meds: . antiseptic oral rinse  7 mL Mouth Rinse BID  . atorvastatin  10 mg Oral q1800  . diltiazem  60 mg Oral QID  . docusate sodium  100 mg Oral BID  . heparin subcutaneous  5,000 Units Subcutaneous 3 times per day  . latanoprost  1 drop Both Eyes QHS  . metoprolol tartrate  12.5 mg Oral BID  . pantoprazole  40 mg Oral Daily  . senna  1 tablet Oral BID  . senna-docusate  1 tablet Oral BID   Continuous Infusions: . sodium chloride (hypertonic) 100 mL/hr (02/10/15 0433)   PRN Meds:.acetaminophen **OR** acetaminophen, bisacodyl, labetalol, levalbuterol, morphine injection, ondansetron **OR** ondansetron (ZOFRAN) IV, promethazine  PE: General appearance: alert, cooperative and no distress Lungs: clear to auscultation bilaterally Heart: irregularly irregular rhythm and -MR Abdomen: +BS, nontender Extremities: -LEE Pulses: 2+ and symmetric Skin: Warm and dry Neurologic: Grossly normal  Lab Results:   Recent Labs  02/08/15 0335  WBC 11.9*  HGB 12.0  HCT 37.8  PLT 199   BMET  Recent Labs  02/07/15 2240 02/08/15 0335  02/09/15 1630 02/09/15 2040 02/10/15 0245  NA 143 142  < > 145 145 147*  K 3.1* 3.3*  --   --   --   --   CL 102 102  --   --   --   --   CO2 32 31  --   --   --   --   GLUCOSE 111* 102*  --   --   --   --   BUN 8 6  --   --   --   --   CREATININE 0.53 0.45  --   --   --   --   CALCIUM 8.3* 8.3*  --   --    --   --   < > = values in this interval not displayed.   Assessment/Plan  80 year old mild to moderately overweight Caucasian female who was admitted on 02/05/15 with a cerebellar hemorrhage. She has a history of hypertension in the past. She has COPD and history of breast cancer. She's reports a remote history of PAF in the setting of knee replacement which spontaneously converted. Her current hospitalization has been complicated by atrial fibrillation w/ RVR.   Active Problems:   Cerebellar hemorrhage   Acute cerebellar hemorrhage   Hypokalemia   Chronic combined systolic and diastolic congestive heart failure   Atrial fibrillation with RVR   HLD (hyperlipidemia)   Obstructive hydrocephalus  PAF: in the setting of acute neurologic illness. 2D echo is normal. She remains in afib but rate has increased to the 130-140's.  On Cardizem 25m Q6hr and metoprolol 12.5 BID for rate control(just given).  Increase  lopressor to 238mTID.  Given her hemorrhage, she is not a candidate for systemic anticoagulation. Continue to monitor closely on telemetry.  Recheck potassium now.    LOS: 5 days    HAGER,  BRYAN PA-C 02/10/2015 11:35 AM  Personally seen and examined. Agree with above.  80 y.o. female with right cerebellar hemorrhage, AFIB AFIB  - goal on AVG HR <110  - Will increase metoprolol 25 TID, continue diltiazem 60 QID  - no anticoagulation with ICH  - Prior AFIB spontaneously converted in setting of knee replacement.    Candee Furbish, MD

## 2015-02-10 NOTE — Progress Notes (Signed)
Rehab admissions - I met with patient and 2 daughters at the bedside yesterday.  They are interested in inpatient rehab admission.  Once patient is medically stable, I can consider for inpatient rehab admission.  I will follow up in am.  Call me for questions.  #[phone]

## 2015-02-11 LAB — CULTURE, BLOOD (ROUTINE X 2): Culture: NO GROWTH

## 2015-02-11 LAB — SODIUM
SODIUM: 146 mmol/L — AB (ref 135–145)
Sodium: 150 mmol/L — ABNORMAL HIGH (ref 135–145)
Sodium: 146 mmol/L — ABNORMAL HIGH (ref 135–145)
Sodium: 145 mmol/L (ref 135–145)

## 2015-02-11 LAB — POTASSIUM: POTASSIUM: 3 mmol/L — AB (ref 3.5–5.1)

## 2015-02-11 MED ORDER — BETHANECHOL CHLORIDE 25 MG PO TABS
25.0000 mg | ORAL_TABLET | Freq: Three times a day (TID) | ORAL | Status: DC
  Administered 2015-02-11 – 2015-02-14 (×10): 25 mg via ORAL
  Filled 2015-02-11 (×12): qty 1

## 2015-02-11 MED ORDER — POTASSIUM CHLORIDE CRYS ER 20 MEQ PO TBCR
40.0000 meq | EXTENDED_RELEASE_TABLET | ORAL | Status: AC
  Administered 2015-02-11 (×2): 40 meq via ORAL
  Filled 2015-02-11 (×2): qty 2

## 2015-02-11 MED ORDER — METOPROLOL TARTRATE 50 MG PO TABS
50.0000 mg | ORAL_TABLET | Freq: Two times a day (BID) | ORAL | Status: DC
  Administered 2015-02-11 – 2015-02-13 (×4): 50 mg via ORAL
  Filled 2015-02-11 (×5): qty 1

## 2015-02-11 NOTE — Progress Notes (Signed)
Rehab admissions - Continuing to follow.  Awaiting neurosurgery clearance prior to admission to inpatient rehab.  Noted patient weaning off of 3% NS drip today.  Call me for questions.  #[phone]

## 2015-02-11 NOTE — Progress Notes (Signed)
STROKE TEAM PROGRESS NOTE   HISTORY [name] is an 80 y.o. female with a history of congestive heart failure, COPD, breast cancer, transient atrial fibrillation and hypertension, presenting with acute onset of ataxia with vertigo, nausea and blurred vision at 6 AM this morning. She has been on aspirin 81 mg most days. She has not been on anticoagulation. CT scan of her head showed a large right hemispheric cerebellar hematoma measuring 4.0 x 3.6 mm. There was rounding edema with significant mass effect on the fourth ventricle. There were no signs of hydrocephalus. NIH stroke score was 4.  LSN: 6 AM on 02/05/2015 tPA Given: No: Acute cerebellar hemorrhage   SUBJECTIVE (INTERVAL HISTORY) Daughters at the bedside. Patient feels better, her diplopia is improving. Na 150 and 3% saline down to 75cc/h. Still has fib with RVR, metoporolol dose increased.   OBJECTIVE Temp:  [97.8 F (36.6 C)-98.2 F (36.8 C)] 97.8 F (36.6 C) (06/10 0758) Pulse Rate:  [59-124] 115 (06/10 1033) Cardiac Rhythm:  [-] Atrial fibrillation (06/10 0800) Resp:  [16-33] 16 (06/10 0800) BP: (87-169)/(37-103) 123/71 mmHg (06/10 1033) SpO2:  [92 %-100 %] 96 % (06/10 0800)   Recent Labs Lab 02/05/15 1031  GLUCAP 145*    Recent Labs Lab 02/05/15 1016 02/05/15 1508 02/06/15 0934 02/07/15 0550  02/07/15 2240 02/08/15 0335  02/10/15 1120 02/10/15 1600 02/10/15 2030 02/11/15 0415 02/11/15 1006  NA 138 138 138 137  < > 143 142  < > 145 146* 145 150* 146*  K 3.6 3.8 3.9 4.0  --  3.1* 3.3*  --   --  2.9*  --  3.0*  --   CL 101  --  103 99*  --  102 102  --   --   --   --   --   --   CO2 28  --  27 29  --  32 31  --   --   --   --   --   --   GLUCOSE 174* 163* 124* 115*  --  111* 102*  --   --   --   --   --   --   BUN 16  --  10 10  --  8 6  --   --   --   --   --   --   CREATININE 0.63  --  0.53 0.53  --  0.53 0.45  --   --   --   --   --   --   CALCIUM 9.0  --  8.4* 8.4*  --  8.3* 8.3*  --   --   --    --   --   --   MG  --   --   --   --   --  2.0  --   --   --   --   --   --   --   < > = values in this interval not displayed.  Recent Labs Lab 02/05/15 1016  AST 25  ALT 21  ALKPHOS 115  BILITOT 0.6  PROT 7.0  ALBUMIN 4.2    Recent Labs Lab 02/05/15 1016 02/05/15 1508 02/06/15 0934 02/07/15 0550 02/08/15 0335  WBC 12.4*  --  13.8* 12.2* 11.9*  NEUTROABS 9.7*  --   --   --   --   HGB 14.8 12.9 12.0 11.4* 12.0  HCT 44.9 38.0 36.4 34.7* 37.8  MCV 93.9  --  92.9 94.0 95.0  PLT 213  --  207 183 199    Recent Labs Lab 02/07/15 2240 02/08/15 0335 02/08/15 0905  TROPONINI <0.03 0.03 <0.03   No results for input(s): LABPROT, INR in the last 72 hours. No results for input(s): COLORURINE, LABSPEC, [place], GLUCOSEU, HGBUR, BILIRUBINUR, KETONESUR, PROTEINUR, UROBILINOGEN, NITRITE, LEUKOCYTESUR in the last 72 hours.  Invalid input(s): APPERANCEUR     Component Value Date/Time   CHOL 175 02/06/2015 0933   TRIG 117 02/06/2015 0933   HDL 51 02/06/2015 0933   CHOLHDL 3.4 02/06/2015 0933   VLDL 23 02/06/2015 0933   LDLCALC 101* 02/06/2015 0933   Lab Results  Component Value Date   HGBA1C 6.2* 02/06/2015   No results found for: LABOPIA, COCAINSCRNUR, LABBENZ, AMPHETMU, THCU, LABBARB  No results for input(s): ETH in the last 168 hours.  I have personally reviewed the radiological images below and agree with the radiology interpretations.  Ct Head Wo Contrast 02/05/2015    1) 4.0 cm acute hemorrhage within the right cerebellar hemisphere with surrounding edema and mass effect on the fourth ventricle. There is no hydrocephalus. The estimated volume is 25-30 mL. 2) Lacunar infarct of the right caudate head appears remote.  3) No acute supratentorial abnormality.    Ct Head Wo Contrast 02/06/2015    IMPRESSION: Post right occipital craniotomy for drainage of right cerebellar hematoma. Although there has been debulking of the central aspect of the hematoma, right cerebellar  hematoma remains spanning over 4.1 x 3.5 cm versus prior 4.4 x 3.5 cm.  Surrounding vasogenic edema and mass effect have progressed slightly. Compression of the fourth ventricle has progressed since the prior exam. Right cerebellar tonsil extension into the foramen magnum and upward herniation of the right cerebellum. Compression of the right aspect of the quadrigeminal plate has progressed slightly. Ventricular size including asymmetric mild prominence of the left temporal horn relatively similar to prior exam. Patient is at risk for development of hydrocephalus. Close follow-up recommended.     02/08/2015   IMPRESSION: Interval development of mild hydrocephalus post partial drainage of right cerebellar hematoma. Clinical correlation and close CT monitoring recommended.  Change in configuration of the right cerebellar hematoma at as noted above. Surrounding vasogenic edema and mass effect including downward mild herniation of the right cerebellar tonsil and moderate upward herniation of the right vermis and cerebellum with compression of the fourth ventricle, aqueduct, brainstem and quadrigeminal plate.    02/10/2015   IMPRESSION: Right cerebellar hematoma is slightly larger compared to the prior study. There is mass-effect on the fourth ventricle and brainstem and mild hydrocephalus which is stable.     Dg Chest Portable 1 View 02/05/2015    No acute cardiopulmonary disease.   2D echo - - Procedure narrative: Transthoracic echocardiography. Imagequality was poor. The study was technically difficult, as aresult of poor acoustic windows, poor sound wave transmission,and restricted patient mobility. - Left ventricle: The cavity size was normal. Wall thickness wasincreased in a pattern of mild LVH. Systolic function wasvigorous. The estimated ejection fraction was in the range of 65%to 70%. Wall motion was normal; there were no regional wallmotion abnormalities. Doppler parameters are consistent  withabnormal left ventricular relaxation (grade 1 diastolicdysfunction). The E/e&' ratio is between 8-15, suggestingindeterminate LV filling pressure. - Aortic valve: Poorly visualized. There was no stenosis. There wasno regurgitation. - Mitral valve: Calcified annulus. There was trivial regurgitation. - Left atrium: The atrium was normal in size. - Right atrium: The atrium was mildly dilated.  PHYSICAL EXAM Temp:  [97.8 F (36.6 C)-98.2 F (36.8 C)] 97.8 F (36.6 C) (06/10 0758) Pulse Rate:  [59-124] 115 (06/10 1033) Resp:  [16-33] 16 (06/10 0800) BP: (87-169)/(37-103) 123/71 mmHg (06/10 1033) SpO2:  [92 %-100 %] 96 % (06/10 0800)  General - Well nourished, well developed, in no apparent distress.  Ophthalmologic - Fundi not visualized due to eye movement and nystagmus.  Cardiovascular - Regular rate and rhythm.  Mental Status -  Level of arousal and orientation to time, place, and person were intact. Language including expression, naming, repetition, comprehension was assessed and found intact. Fund of Knowledge was assessed and was intact.  Cranial Nerves II - XII - II - Visual field intact OU. III, IV, VI - Extraocular movements showed mild right gaze difficulty with mild right CN VI palsy. V - Facial sensation intact bilaterally. VII - right lower facial droop VIII - Hearing & vestibular intact bilaterally, horizontal nystagmus on right side gaze, rotational nystagmus on upward and downward gaze. X - Palate elevates symmetrically. XI - Chin turning & shoulder shrug intact bilaterally. XII - Tongue protrusion intact.  Motor Strength - The patient's strength was normal in all extremities and pronator drift was absent.  Bulk was normal and fasciculations were absent.   Motor Tone - Muscle tone was assessed at the neck and appendages and was normal.  Reflexes - The patient's reflexes were 1+ in all extremities and she had no pathological reflexes.  Sensory - Light  touch, temperature/pinprick were assessed and were symmetrical.    Coordination - The patient had ataxia on the right FTN and HTS, intact on the left.  Tremor was absent.  Gait and Station - not tested due to safety concerns.   ASSESSMENT/PLAN [name] is a 80 y.o. female with history of COPD, breast cancer on remission, bladder cancer s/p chemotherapy and surgery, paroxysmal A. fib on aspirin and the CHF presenting with ataxia, vertigo and nausea vomiting. CT showed right cerebellar ICH. She received right cerebellar hematoma evacuation.     Right cerebellar ICH:  S/p hematoma evacuation. Etiology not clear, HTN, vs. CAA vs. AVM vs. Brain mets. Patient blood pressure well controlled at home and after admission, and malignancy also in remission. Will need follow-up MRI, however patient reluctant to do any MRI.  Resultant  diplopia, nystagmus, ataxia. Overall neuro staus improved.  CT showed right cerebellar hemorrhage  Repeat CT showed mild hydrocephalus but stable  2D Echo unremarkable  LDL 101  HgbA1c 6.2  Heparin subcutaneous for VTE prophylaxis Diet regular Room service appropriate?: Yes; Fluid consistency:: Thin  aspirin 81 mg orally every day prior to admission, now on no antithrombotic  Ongoing aggressive stroke risk factor management  Therapy recommendations:  CIR  Disposition:  Pending  Cerebellar edema, induced hypernatremia   Due to cerebellar hematoma  Put on 3% saline for cerebral edema  Goal Na 150-160  Na Q6  Sodium today 147->150   Close watch for hydrocephalus  Paroxysmal Afib with RVR  Cardiology on board  On metoprolol and cardizem  Rate still uncontrolled  On aspirin 81 at home  Patient is not anticoagulation candidate due to spontaneous cerebellar hemorrhage.   Hypertension  BP stable 110-150  Pt denies hx of HTN  Lisinopril switched to metoprolol  Currently metoprolol 33m bid  Hyperlipidemia  LDL 101, goal <  70  Add Lipitor 10  Continue statin at discharge  Other Stroke Risk Factors  Advanced age  Former smoker for  40 years  Breast cancer in remission  Bladder cancer were treated last December  CHF  Other Active Problems  Leukocytosis  Hospital day # 6   This patient is critically ill due to right cerebellar ICH s/p evacuation, A. fib with RVR, mild hydrocephalus and at significant risk of neurological worsening, death form cerebellar edema, transtentorial herniation, obstructive hydrocephalus and respiratory failure. This patient's care requires constant monitoring of vital signs, hemodynamics, respiratory and cardiac monitoring, review of multiple databases, neurological assessment, discussion with family, other specialists and medical decision making of high complexity. I spent 35 minutes of neurocritical care time in the care of this patient.  Rosalin Hawking, MD PhD Stroke Neurology 02/11/2015 11:45 AM   To contact Stroke Continuity provider, please refer to [email or web address] After hours, contact General Neurology

## 2015-02-11 NOTE — Progress Notes (Signed)
    Subjective: Denies CP or dyspnea.  Objective: Vital signs in last 24 hours: Temp:  [97.8 F (36.6 C)-98.6 F (37 C)] 97.8 F (36.6 C) (06/10 0758) Pulse Rate:  [59-124] 124 (06/10 0800) Resp:  [16-33] 16 (06/10 0800) BP: (87-169)/(37-103) 169/90 mmHg (06/10 0800) SpO2:  [92 %-100 %] 96 % (06/10 0800) Last BM Date: 02/09/15  Intake/Output from previous day: 06/09 0701 - 06/10 0700 In: 2200 [I.V.:2200] Out: 3550 [Urine:3550] Intake/Output this shift: Total I/O In: 100 [I.V.:100] Out: -   Medications Scheduled Meds: . antiseptic oral rinse  7 mL Mouth Rinse BID  . atorvastatin  10 mg Oral q1800  . bethanechol  25 mg Oral TID  . diltiazem  60 mg Oral QID  . docusate sodium  100 mg Oral BID  . heparin subcutaneous  5,000 Units Subcutaneous 3 times per day  . latanoprost  1 drop Both Eyes QHS  . metoprolol tartrate  25 mg Oral TID  . pantoprazole  40 mg Oral Daily  . potassium chloride  40 mEq Oral Q4H  . senna  1 tablet Oral BID  . senna-docusate  1 tablet Oral BID   Continuous Infusions: . sodium chloride (hypertonic) 75 mL/hr at 02/11/15 0800   PRN Meds:.acetaminophen **OR** acetaminophen, bisacodyl, labetalol, levalbuterol, morphine injection, ondansetron **OR** ondansetron (ZOFRAN) IV, promethazine  PE: General appearance: alert, cooperative and no distress Lungs: clear to auscultation bilaterally Heart: irregularly irregular rhythm and tachycardic Abdomen: +BS, nontender Extremities: -LEE Skin: Warm and dry Neurologic: Grossly normal   BMET  Recent Labs  02/10/15 1600 02/10/15 2030 02/11/15 0415  NA 146* 145 150*  K 2.9*  --  3.0*     Assessment/Plan  80 year old mild to moderately overweight Caucasian female who was admitted on 02/05/15 with a cerebellar hemorrhage. She has a history of hypertension in the past. She has COPD and history of breast cancer. She's reports a remote history of PAF in the setting of knee replacement which spontaneously  converted. Her current hospitalization has been complicated by atrial fibrillation w/ RVR.    PAF: in the setting of acute neurologic illness. 2D echo is normal. She remains in afib but rate has increased to the 120-140's.  On Cardizem 47m Q6hr and metoprolol 25 mg po TID; increase metoprolol to 50 mg BID. Given her hemorrhage, she is not a candidate for systemic anticoagulation.  BKirk Ruths MD

## 2015-02-11 NOTE — Progress Notes (Signed)
Physical Therapy Treatment Patient Details Name: [name] MRN: [id number] DOB: [birth year] Today's Date: 02/11/2015    History of Present Illness ALFONSO SHACKETT is an 80 y.o. female with a history of congestive heart failure, COPD, breast cancer, transient atrial fibrillation and hypertension, presenting with acute onset of ataxia with vertigo, nausea and blurred vision at 6 AM this morning. She has been on aspirin 81 mg most days. He has not been on anticoagulation. CT scan of her head showed a large right hemispheric cerebellar hematoma measuring 4.0 x 3.6 mm. There was rounding edema with significant mass effect on the fourth ventricle. There were no signs of hydrocephalus. NIH stroke score was 4.    PT Comments    Progressing well, but still needing 2 person truncal control for safe mobility.  Will be a great CIR candidate when cleared from neuro.  Follow Up Recommendations  CIR     Equipment Recommendations       Recommendations for Other Services Rehab consult     Precautions / Restrictions Precautions Precautions: Fall    Mobility  Bed Mobility Overal bed mobility: Needs Assistance Bed Mobility: Sidelying to Sit Rolling: Min assist     Sit to supine: Min guard   General bed mobility comments: pt impulsive and needed help to keep from falling face first into the floor.  Transfers Overall transfer level: Needs assistance Equipment used: 2 person hand held assist Transfers: Sit to/from Stand Sit to Stand: Mod assist;+2 physical assistance Stand pivot transfers: Mod assist;+2 physical assistance       General transfer comment: Still needing significant truncal assist, but pt able to use to support better to [facility] more functionally/fluidly.  Ambulation/Gait                 Stairs            Wheelchair Mobility    Modified Rankin (Stroke Patients Only) Modified Rankin (Stroke Patients Only) Pre-Morbid Rankin Score: No  symptoms Modified Rankin: Severe disability     Balance Overall balance assessment: Needs assistance Sitting-balance support: No upper extremity supported Sitting balance-Leahy Scale: Fair     Standing balance support: Bilateral upper extremity supported;Single extremity supported;During functional activity Standing balance-Leahy Scale: Poor Standing balance comment: Worked > 15 min at EOB on balance and controlled movement in upright and crouched stance. with 2 person graded support.  pt able to utilize the support to move more smoothly, especially stepping with either LE.                      Cognition Arousal/Alertness: Awake/alert Behavior During Therapy: Flat affect Overall Cognitive Status: Within Functional Limits for tasks assessed Area of Impairment: Attention;Memory;Following commands;Safety/judgement;Awareness   Current Attention Level: Selective Memory: Decreased short-term memory Following Commands: Follows one step commands consistently Safety/Judgement: Decreased awareness of deficits Awareness: Intellectual (close to emergent) Problem Solving: Requires tactile cues;Requires verbal cues;Difficulty sequencing;Slow processing      Exercises      General Comments General comments (skin integrity, edema, etc.): See OT note for vision update, but nystagmus on right appears better with smoother pursuit.  Greater range of scan to R.  Vison more blurry to R.  Impulsivity remains, but pt a bit more aware of her deficits.      Pertinent Vitals/Pain Pain Assessment: No/denies pain    Home Living                      Prior  Function            PT Goals (current goals can now be found in the care plan section) Acute Rehab PT Goals PT Goal Formulation: With patient Time For Goal Achievement: 02/21/15 Potential to Achieve Goals: Good Progress towards PT goals: Progressing toward goals    Frequency  Min 4X/week    PT Plan Current plan remains  appropriate    Co-evaluation PT/OT/SLP Co-Evaluation/Treatment: Yes Reason for Co-Treatment: Complexity of the patient's impairments (multi-system involvement);For patient/therapist safety PT goals addressed during session: Mobility/safety with mobility       End of Session   Activity Tolerance: Patient tolerated treatment well;Patient limited by fatigue Patient left: with call bell/phone within reach;in bed;with family/visitor present     Time: 6389-3734 PT Time Calculation (min) (ACUTE ONLY): 41 min  Charges:  $Therapeutic Activity: 8-22 mins                    G Codes:      Tari Lecount, Tessie Fass 02/11/2015, 3:59 PM 02/11/2015  Donnella Sham, PT [phone]  (pager)

## 2015-02-11 NOTE — Progress Notes (Signed)
Occupational Therapy Treatment Patient Details Name: [name] MRN: [id number] DOB: 26-Jul-1935 Today's Date: 02/11/2015    History of present illness [name] is an 80 y.o. female with a history of congestive heart failure, COPD, breast cancer, transient atrial fibrillation and hypertension, presenting with acute onset of ataxia with vertigo, nausea and blurred vision at 6 AM this morning. She has been on aspirin 81 mg most days. He has not been on anticoagulation. CT scan of her head showed a large right hemispheric cerebellar hematoma measuring 4.0 x 3.6 mm. There was rounding edema with significant mass effect on the fourth ventricle. There were no signs of hydrocephalus. NIH stroke score was 4.   OT comments  Pt demonstrates improving trunk control and balance this date.  Nystagmus seems less with Lt gaze this date.  She requires mod a +2 for functional transfers.   She continues to be very motivated.  Recommend CIR  Follow Up Recommendations  CIR;Supervision/Assistance - 24 hour    Equipment Recommendations  3 in 1 bedside comode;Wheelchair (measurements OT)    Recommendations for Other Services Rehab consult    Precautions / Restrictions Precautions Precautions: Fall       Mobility Bed Mobility Overal bed mobility: Needs Assistance Bed Mobility: Sidelying to Sit Rolling: Min assist     Sit to supine: Min guard   General bed mobility comments: pt impulsive and needed help to keep from falling face first into the floor.  Transfers Overall transfer level: Needs assistance Equipment used: 2 person hand held assist Transfers: Sit to/from [facility] Sit to Stand: Mod assist;+2 physical assistance Stand pivot transfers: Mod assist;+2 physical assistance       General transfer comment: Still needing significant truncal assist, but pt able to use to support better to [facility] more functionally/fluidly.    Balance Overall balance assessment:  Needs assistance Sitting-balance support: No upper extremity supported Sitting balance-Leahy Scale: Fair     Standing balance support: Bilateral upper extremity supported Standing balance-Leahy Scale: Poor Standing balance comment: Worked > 15 min at EOB on balance and controlled movement in upright and crouched stance. with 2 person graded support.  pt able to utilize the support to move more smoothly, especially stepping with either LE.                     ADL                           Toilet Transfer: Moderate assistance;+2 for physical assistance;Ambulation;BSC Toilet Transfer Details (indicate cue type and reason): see comments below  Toileting- Clothing Manipulation and Hygiene: Moderate assistance;Sit to/from stand                Vision                 Additional Comments: Pt with 3/4 ROM to Rt with Rt eye.  Less nystagmus noted with Lt gaze this date    Perception     Praxis      Cognition   Behavior During Therapy: Flat affect Overall Cognitive Status: Within Functional Limits for tasks assessed Area of Impairment: Attention;Memory;Following commands;Safety/judgement;Awareness   Current Attention Level: Selective Memory: Decreased short-term memory  Following Commands: Follows one step commands consistently Safety/Judgement: Decreased awareness of deficits Awareness: Intellectual (close to emergent) Problem Solving: Requires tactile cues;Requires verbal cues;Difficulty sequencing;Slow processing      Extremity/Trunk Assessment  Exercises     Shoulder Instructions       General Comments      Pertinent Vitals/ Pain       Pain Assessment: No/denies pain  Home Living                                          Prior Functioning/Environment              Frequency Min 3X/week     Progress Toward Goals  OT Goals(current goals can now be found in the care plan section)     ADL  Goals Pt Will Perform Eating: with modified independence;sitting Pt Will Perform Grooming: with supervision;sitting Pt Will Perform Upper Body Bathing: with supervision;sitting Pt Will Perform Lower Body Bathing: with mod assist;sit to/from stand Pt Will Perform Upper Body Dressing: with supervision;sitting Pt Will Perform Lower Body Dressing: with mod assist;sit to/from stand Pt Will Transfer to Toilet: with mod assist;stand pivot transfer;bedside commode Pt Will Perform Toileting - Clothing Manipulation and hygiene: with mod assist;sit to/from stand Additional ADL Goal #1: Pt will be independent with HEP for vision   Plan Discharge plan remains appropriate    Co-evaluation    PT/OT/SLP Co-Evaluation/Treatment: Yes Reason for Co-Treatment: Complexity of the patient's impairments (multi-system involvement) PT goals addressed during session: Mobility/safety with mobility OT goals addressed during session: ADL's and self-care      End of Session     Activity Tolerance Patient tolerated treatment well   Patient Left in bed;with call bell/phone within reach;with family/visitor present   Nurse Communication Mobility status        Time: 5852-7782 OT Time Calculation (min): 41 min  Charges: OT General Charges $OT Visit: 1 Procedure OT Treatments $Neuromuscular Re-education: 23-37 mins  [name] M 02/11/2015, 6:21 PM

## 2015-02-11 NOTE — Progress Notes (Signed)
Pt seen and examined. No issues overnight. Pt has no significant c/o this am  EXAM: Temp:  [98.1 F (36.7 C)-98.8 F (37.1 C)] 98.2 F (36.8 C) (06/10 0359) Pulse Rate:  [59-114] 99 (06/10 0600) Resp:  [16-33] 19 (06/10 0700) BP: (87-157)/(37-103) 150/85 mmHg (06/10 0700) SpO2:  [92 %-100 %] 99 % (06/10 0600) Intake/Output      06/09 0701 - 06/10 0700 06/10 0701 - 06/11 0700   P.O.     I.V. (mL/kg) 2200 (29.6)    Total Intake(mL/kg) 2200 (29.6)    Urine (mL/kg/hr) 3550 (2)    Stool     Total Output 3550     Net -1350           Awake, alert, oriented Speech fluent Good strength throughout  LABS: Lab Results  Component Value Date   CREATININE 0.45 02/08/2015   BUN 6 02/08/2015   NA 150* 02/11/2015   K 3.0* 02/11/2015   CL 102 02/08/2015   CO2 31 02/08/2015   Lab Results  Component Value Date   WBC 11.9* 02/08/2015   HGB 12.0 02/08/2015   HCT 37.8 02/08/2015   MCV 95.0 02/08/2015   PLT 199 02/08/2015    IMPRESSION: - 80 y.o. female post-bleed d# 6, right cerebellar hemorrhage. No clinical sign of HCP. - Afib, rate appears relatively well controlled  PLAN: - Can start to wean hypertonic saline - Cont therapy. - Cont PO cardizem - Would anticipate transfer to floor over the weekend. - cont cardizem/

## 2015-02-11 NOTE — PMR Pre-admission (Signed)
PMR Admission Coordinator Pre-Admission Assessment  Patient: [name] is an 80 y.o., female MRN: [id number] DOB: 03/22/1935 Height: _0  (167.6 cm) Weight: 74.2 kg (163 lb 9.3 oz)              Insurance Information HMO: No    PPO:       PCP:       IPA:       80/20:       OTHER:   PRIMARY: Medicare A/B      Policy#: [id number]      Subscriber: [name] CM Name:        Phone#:       Fax#:   Pre-Cert#:        Employer: Retired Benefits:  Phone #:       Name: Checked in [place]. Date: 06/03/02     Deduct: $1288      Out of Pocket Max: none      Life Max: unlimited CIR: 100%      SNF: 100 days Outpatient: 80%     Co-Pay: 20% Home Health: 100%      Co-Pay: none DME: 80%     Co-Pay: 20% Providers: patient's choice  SECONDARY: BCBS supplement      Policy#: [id number]      Subscriber: [name] CM Name:        Phone#:       Fax#:   Pre-Cert#:        Employer: Retired Benefits:  Phone #: [phone]     Name:   Eff. Date:       Deduct:        Out of Pocket Max:        Life Max:   CIR:        SNF:   Outpatient:       Co-Pay:   Home Health:        Co-Pay:   DME:       Co-Pay:    Emergency Contact Information Contact Information    Name Relation Home Work [place] Spouse [phone]  [phone]   [name] Daughter   [phone]     Current Medical History  Patient Admitting Diagnosis: R cerebellar hemorrhage   History of Present Illness: A 80 y.o. right handed female with history of hypertension, transient atrial fibrillation maintained on aspirin, diastolic congestive heart failure, COPD, recurrent bladder papillary carcinoma as well as breast cancer with mastectomy. Patient lives with husband and independent prior to admission. Presented 02/05/2015 with dizziness and nausea/vomiting as well as bouts of blurred vision. Denies any recent fall or head trauma. CT of the head showed a 4 cm acute hemorrhage within the right cerebellar  hemisphere with surrounding edema and mass effect on the fourth ventricle as well as lacunar infarct of the right caudate head appeared to be remote. Underwent right retrosigmoid suboccipital craniotomy, evacuation of hematoma 02/05/2015 per Dr. Kathyrn Sheriff. Hospital course blood pressure management. Subcutaneous heparin was later initiated for DVT prophylaxis 02/06/2015. Follow-up cranial CT scans shows that right cerebellar hematoma remains spanning over a 4.1 x 3.5 cm versus 4.4 x 3.5 cm as well as surrounding vasogenic edema mildly progressed. A follow-up cranial CT scan for 02/08/2015 with a small amount of mild hydrocephalus advised monitoring. Placed on 3% saline to decrease any cerebral edema. 3 % saline discontinued 02/13/15.  Cardiology consulted 02/08/2015 for a noted history of transient atrial fibrillation had been on aspirin prior to admission patient with bouts  of A. fib RVR and was placed on Cardizem with rate controlled. She was not a candidate for systemic anticoagulation given her hemorrhage. Echocardiogram with ejection fraction 72% grade 1 diastolic dysfunction. Patient is on a regular consistency diet physical and occupational therapy evaluations completed and ongoing with recommendations of physical medicine rehabilitation consult. Patient to be admitted for comprehensive inpatient rehabilitation program.    Total: 9=NIH  Past Medical History  Past Medical History  Diagnosis Date  . History of CHF (congestive heart failure)     POST SURG 2010  . COPD (chronic obstructive pulmonary disease)   . History of breast cancer     DX DCIS IN 2004--  S/P RIGHT MASTECTOMY , NO CHEMORADIATION---  NO RECURRENCE  . Glaucoma of both eyes   . History of atrial fibrillation without current medication 2010    POST SURG 2010--  PER PT NO ISSUES SINCE  . Arthritis   . Dyspnea on exertion   . GERD (gastroesophageal reflux disease)   . Nephrolithiasis   . History of shingles     02/ 2015--  back  of neck and left flank--  no residual pain  . History of hypertension   . Recurrent bladder papillary carcinoma first dx 06/ 2014    s/p  turbt's  and instillation mitomycin c (chemo)    Family History  family history includes Breast cancer (age of onset: 6) in her maternal aunt; Breast cancer (age of onset: 47) in her daughter; Breast cancer (age of onset: 22) in her other; Cancer in her maternal aunt; Colon cancer in her maternal grandmother; Colon cancer (age of onset: 71) in her mother.  Prior Rehab/Hospitalizations: No previous rehab admissions.  Has the patient had major surgery during 100 days prior to admission? Yes.  Patient had outpatient surgery for bladder cancer in December 2015.  Current Medications   Current facility-administered medications:  .  acetaminophen (TYLENOL) tablet 650 mg, 650 mg, Oral, Q4H PRN, 650 mg at 02/12/15 2101 **OR** acetaminophen (TYLENOL) suppository 650 mg, 650 mg, Rectal, Q4H PRN, Wallie Char .  antiseptic oral rinse (CPC / CETYLPYRIDINIUM CHLORIDE 0.05%) solution 7 mL, 7 mL, Mouth Rinse, BID, Consuella Lose, MD, 7 mL at 02/13/15 1000 .  atorvastatin (LIPITOR) tablet 10 mg, 10 mg, Oral, q1800, Rosalin Hawking, MD, 10 mg at 02/13/15 1752 .  bethanechol (URECHOLINE) tablet 25 mg, 25 mg, Oral, TID, Consuella Lose, MD, 25 mg at 02/14/15 0956 .  bisacodyl (DULCOLAX) suppository 10 mg, 10 mg, Rectal, Daily PRN, Consuella Lose, MD .  diltiazem (CARDIZEM) tablet 60 mg, 60 mg, Oral, QID, Jerline Pain, MD, 60 mg at 02/14/15 0956 .  docusate sodium (COLACE) capsule 100 mg, 100 mg, Oral, BID, Consuella Lose, MD, 100 mg at 02/11/15 2213 .  heparin injection 5,000 Units, 5,000 Units, Subcutaneous, 3 times per day, Consuella Lose, MD, 5,000 Units at 02/14/15 0626 .  labetalol (NORMODYNE,TRANDATE) injection 10-40 mg, 10-40 mg, Intravenous, Q10 min PRN, Wallie Char, 10 mg at 02/12/15 0924 .  latanoprost (XALATAN) 0.005 % ophthalmic solution 1 drop,  1 drop, Both Eyes, QHS, Consuella Lose, MD, 1 drop at 02/13/15 2131 .  levalbuterol (XOPENEX) nebulizer solution 0.63 mg, 0.63 mg, Nebulization, Q4H PRN, Consuella Lose, MD .  metoprolol (LOPRESSOR) tablet 50 mg, 50 mg, Oral, TID, Josue Hector, MD, 50 mg at 02/14/15 0953 .  ondansetron (ZOFRAN) tablet 4 mg, 4 mg, Oral, Q4H PRN **OR** ondansetron (ZOFRAN) injection 4 mg, 4 mg, Intravenous, Q4H PRN, Consuella Lose,  MD, 4 mg at 02/06/15 0151 .  pantoprazole (PROTONIX) EC tablet 40 mg, 40 mg, Oral, Daily, Consuella Lose, MD, 40 mg at 02/14/15 0956 .  promethazine (PHENERGAN) tablet 12.5-25 mg, 12.5-25 mg, Oral, Q4H PRN, Consuella Lose, MD .  senna (SENOKOT) tablet 8.6 mg, 1 tablet, Oral, BID, Consuella Lose, MD, 8.6 mg at 02/11/15 2214 .  sodium chloride (hypertonic) 3 % solution, , Intravenous, Continuous, Amie Portland, MD, Stopped at 02/13/15 [phone]  Patients Current Diet: Diet regular Room service appropriate?: Yes; Fluid consistency:: Thin  Precautions / Restrictions Precautions Precautions: Fall Restrictions Weight Bearing Restrictions: No   Has the patient had 2 or more falls or a fall with injury in the past year?No  Prior Activity Level Community (5-7x/wk): Went out daily.  Ran errands.  Home Assistive Devices / Equipment Home Assistive Devices/Equipment: Cane (specify quad or straight) Home Equipment: Walker - 2 wheels, Bedside commode, Shower seat - built in  Prior Device Use: Indicate devices/aids used by the patient prior to current illness, exacerbation or injury? None of the above.  For safety, used a cane at night to assist going to the bathroom.  Did not use a cane or other device during the day.  Prior Functional Level Prior Function Level of Independence: Independent Comments: drove, ran errands  Self Care: Did the patient need help bathing, dressing, using the toilet or eating?  Independent  Indoor Mobility: Did the patient need assistance with  walking from room to room (with or without device)? Independent  Stairs: Did the patient need assistance with internal or external stairs (with or without device)? Independent  Functional Cognition: Did the patient need help planning regular tasks such as shopping or remembering to take medications? Independent  Current Functional Level Cognition  Arousal/Alertness: Awake/alert Overall Cognitive Status: Within Functional Limits for tasks assessed Current Attention Level: Selective Orientation Level: Oriented X4 Following Commands: Follows one step commands consistently Safety/Judgement: Decreased awareness of deficits Attention: Sustained Sustained Attention: Appears intact Memory: Appears intact Awareness: Appears intact Problem Solving: Appears intact Executive Function: Decision Making Decision Making: Appears intact Safety/Judgment: Appears intact    Extremity Assessment (includes Sensation/Coordination)  Upper Extremity Assessment: RUE deficits/detail, LUE deficits/detail RUE Deficits / Details: Ataxic.  Full AROM  RUE Sensation: decreased proprioception RUE Coordination: decreased fine motor, decreased gross motor LUE Deficits / Details: dysmetria  LUE Sensation: decreased proprioception LUE Coordination: decreased gross motor, decreased fine motor  Lower Extremity Assessment: Defer to PT evaluation RLE Deficits / Details: grossly >3+/5, ataxic and uncontrolled movement RLE Coordination: decreased fine motor LLE Deficits / Details: generally WFL    ADLs  Overall ADL's : Needs assistance/impaired Eating/Feeding: Minimal assistance, Bed level, Sitting Grooming: Wash/dry hands, Wash/dry face, Oral care, Brushing hair, Minimal assistance, Sitting Upper Body Bathing: Moderate assistance, Sitting Lower Body Bathing: Maximal assistance, Sit to/from stand Upper Body Dressing : Moderate assistance, Sitting Lower Body Dressing: Total assistance, Sit to/from stand Lower Body  Dressing Details (indicate cue type and reason): Pt unable to access bil. feet due to ataxia and dizziness Toilet Transfer: Moderate assistance, +2 for physical assistance, Ambulation, BSC Toilet Transfer Details (indicate cue type and reason): see comments below  Toileting- Clothing Manipulation and Hygiene: Moderate assistance, Sit to/from stand Functional mobility during ADLs: Moderate assistance, +2 for physical assistance    Mobility  Overal bed mobility: Needs Assistance Bed Mobility: Sidelying to Sit Rolling: Min assist Sidelying to sit: Min assist Sit to supine: Min guard General bed mobility comments: pt impulsive and needed  help to keep from falling face first into the floor.    Transfers  Overall transfer level: Needs assistance Equipment used: 2 person hand held assist Transfers: Sit to/from Stand, Stand Pivot Transfers Sit to Stand: Mod assist, +2 physical assistance Stand pivot transfers: Mod assist, +2 physical assistance General transfer comment: Still needing significant truncal assist, but pt able to use to support better to [facility] more functionally/fluidly.    Ambulation / Gait / Stairs / Wheelchair Mobility  Ambulation/Gait General Gait Details: not tested    Posture / Balance Dynamic Sitting Balance Sitting balance - Comments: no assist needed. worked on [employer] Overall balance assessment: Needs assistance Sitting-balance support: No upper extremity supported Sitting balance-Leahy Scale: Fair Sitting balance - Comments: no assist needed. worked on [employer] support: Bilateral upper extremity supported Standing balance-Leahy Scale: Poor Standing balance comment: Worked > 15 min at [employer] on balance and controlled movement in upright and crouched stance. with 2 person graded support.  pt able to utilize the support to move more smoothly, especially stepping with either LE.      Special needs/care consideration BiPAP/CPAP No CPM  No Continuous Drip IV No Dialysis No      Life Vest No Oxygen No Special Bed No Trach Size No Wound Vac (area) No      Skin Bruising on forearms                             Bowel mgmt: Last BM 02/09/15 Bladder mgmt: Catheter out now and up on BSC Diabetic mgmt No    Previous Home Environment Living Arrangements: Spouse/significant other (and a son who has the more flexible schedule)  Lives With: Spouse Available Help at Discharge: Family Type of Home: House Home Layout: Two level, Able to live on main level with bedroom/bathroom Alternate Level Stairs-Rails: Right, Left Alternate Level Stairs-Number of Steps: flight Home Access: Stairs to enter Entrance Stairs-Rails: None Entrance Stairs-Number of Steps: 2 Bathroom Shower/Tub: Multimedia programmer: Handicapped height Bathroom Accessibility: Yes How Accessible: Accessible via walker Home Care Services: No  Discharge Living Setting Plans for Discharge Living Setting: Patient's home, House, Lives with (comment) (Lives with husband and with son, 38 yo.) Type of Home at Discharge: House Discharge Home Layout: Two level, Able to live on main level with bedroom/bathroom Alternate Level Stairs-Number of Steps: Flight Discharge Home Access: Stairs to enter Technical brewer of Steps: 2 Does the patient have any problems obtaining your medications?: No  Social/Family/Support Systems Patient Roles: Spouse, Parent (Has a husband, son and daughters.) Contact Information: [name] - spouse [phone] Anticipated Caregiver: son and daughters Ability/Limitations of Caregiver: Son Radiation protection practitioner and can assist.  Dtr out for summer.  Another daughter can also assist.  Husband works FT. Caregiver Availability: 24/7 Discharge Plan Discussed with Primary Caregiver: Yes Is Caregiver In Agreement with Plan?: Yes Does Caregiver/Family have Issues with Lodging/Transportation while Pt is in Rehab?: No  Goals/Additional  Needs Patient/Family Goal for Rehab: PT/OT min assist, ST supervision goals Expected length of stay: 14-18 days Cultural Considerations: None Dietary Needs: Regular diet, thin liquids Equipment Needs: TBD Pt/Family Agrees to Admission and willing to participate: Yes Program Orientation Provided & Reviewed with Pt/Caregiver Including Roles  & Responsibilities: Yes  Decrease burden of Care through IP rehab admission: N/A  Possible need for SNF placement upon discharge: Not anticipated  Patient Condition: This patient's medical and functional status has changed since the consult  dated: 02/09/15 in which the Rehabilitation Physician determined and documented that the patient's condition is appropriate for intensive rehabilitative care in an inpatient rehabilitation facility. See "History of Present Illness" (above) for medical update. Functional changes are: Currently requiring mod assist for stand pivot transfers. Patient's medical and functional status update has been discussed with the Rehabilitation physician and patient remains appropriate for inpatient rehabilitation. Will admit to inpatient rehab today.  Preadmission Screen Completed By:  Retta Diones, 02/14/2015 11:29 AM ______________________________________________________________________   Discussed status with Dr. Naaman Plummer on 02/14/15 at 1115 and received telephone approval for admission today.  Admission Coordinator:  Retta Diones, time1130/Date06/13/16

## 2015-02-12 DIAGNOSIS — R42 Dizziness and giddiness: Secondary | ICD-10-CM

## 2015-02-12 DIAGNOSIS — G936 Cerebral edema: Secondary | ICD-10-CM | POA: Insufficient documentation

## 2015-02-12 LAB — CULTURE, BLOOD (ROUTINE X 2): Culture: NO GROWTH

## 2015-02-12 LAB — SODIUM
SODIUM: 149 mmol/L — AB (ref 135–145)
Sodium: 148 mmol/L — ABNORMAL HIGH (ref 135–145)
Sodium: 147 mmol/L — ABNORMAL HIGH (ref 135–145)
Sodium: 145 mmol/L (ref 135–145)

## 2015-02-12 NOTE — Progress Notes (Signed)
Seems to be doing well. Has no specific complaints. Denies headache. Moving all extremities. Sitting up in bed eating a [employer] biscuit.

## 2015-02-12 NOTE — Progress Notes (Signed)
Patient ID: [name], female   DOB: 23-Sep-1934, 80 y.o.   MRN: [id number]    Subjective: Denies CP or dyspnea. Having Icee with daughters   Objective: Vital signs in last 24 hours: Temp:  [97.6 F (36.4 C)-98.4 F (36.9 C)] 98.4 F (36.9 C) (06/11 0800) Pulse Rate:  [25-130] 100 (06/11 1000) Resp:  [16-32] 17 (06/11 1000) BP: (102-179)/(58-115) 102/58 mmHg (06/11 1000) SpO2:  [88 %-96 %] 96 % (06/11 1000) Last BM Date: 02/12/15  Intake/Output from previous day: 06/10 0701 - 06/11 0700 In: 1838.3 [I.V.:1838.3] Out: 1660 [Urine:1660] Intake/Output this shift: Total I/O In: 635 [P.O.:360; I.V.:275] Out: -   Medications Scheduled Meds: . antiseptic oral rinse  7 mL Mouth Rinse BID  . atorvastatin  10 mg Oral q1800  . bethanechol  25 mg Oral TID  . diltiazem  60 mg Oral QID  . docusate sodium  100 mg Oral BID  . heparin subcutaneous  5,000 Units Subcutaneous 3 times per day  . latanoprost  1 drop Both Eyes QHS  . metoprolol tartrate  50 mg Oral BID  . pantoprazole  40 mg Oral Daily  . senna  1 tablet Oral BID  . senna-docusate  1 tablet Oral BID   Continuous Infusions: . sodium chloride (hypertonic) 50 mL/hr (02/12/15 1050)   PRN Meds:.acetaminophen **OR** acetaminophen, bisacodyl, labetalol, levalbuterol, ondansetron **OR** ondansetron (ZOFRAN) IV, promethazine  PE: General appearance: alert, cooperative and no distress Lungs: clear to auscultation bilaterally Heart: irregularly irregular rhythm and tachycardic Abdomen: +BS, nontender Extremities: -LEE Skin: Warm and dry Neurologic: Grossly normal   BMET  Recent Labs  02/10/15 1600  02/11/15 0415  02/11/15 2040 02/12/15 0400 02/12/15 1010  NA 146*  < > 150*  < > 145 149* 148*  K 2.9*  --  3.0*  --   --   --   --   < > = values in this interval not displayed.   Assessment/Plan  80 year old mild to moderately overweight Caucasian female who was admitted on 02/05/15 with a cerebellar hemorrhage.  She has a history of hypertension in the past. She has COPD and history of breast cancer. She's reports a remote history of PAF in the setting of knee replacement which spontaneously converted. Her current hospitalization has been complicated by atrial fibrillation w/ RVR.    PAF: in the setting of acute neurologic illness. 2D echo is normal. She remains in afib but rate 90-1130   On Cardizem 34m Q6hr and metoprolol  50 mg BID. Given her hemorrhage, she is not a candidate for systemic anticoagulation. Lopressor dose just increased yesterday by Dr CStanford Breed Will monitor for 48 hrs and consider increasing to tid if resting HR still elevated  PJenkins Rouge MD

## 2015-02-12 NOTE — Progress Notes (Signed)
STROKE TEAM PROGRESS NOTE   HISTORY [name] is an 80 y.o. female with a history of congestive heart failure, COPD, breast cancer, transient atrial fibrillation and hypertension, presenting with acute onset of ataxia with vertigo, nausea and blurred vision at 6 AM this morning. She has been on aspirin 81 mg most days. She has not been on anticoagulation. CT scan of her head showed a large right hemispheric cerebellar hematoma measuring 4.0 x 3.6 mm. There was rounding edema with significant mass effect on the fourth ventricle. There were no signs of hydrocephalus. NIH stroke score was 4.  LSN: 6 AM on 02/05/2015 tPA Given: No: Acute cerebellar hemorrhage   SUBJECTIVE (INTERVAL HISTORY) Daughter and husband at the bedside. Patient feels better, no complaints. Na 148 and 3% saline down to 75cc/h. Still has afib with RVR, metoporolol dose increased.   OBJECTIVE Temp:  [97.6 F (36.4 C)-98.4 F (36.9 C)] 97.7 F (36.5 C) (06/11 1210) Pulse Rate:  [25-130] 61 (06/11 1200) Cardiac Rhythm:  [-] Atrial fibrillation (06/11 0800) Resp:  [16-32] 20 (06/11 1200) BP: (102-179)/(58-115) 129/68 mmHg (06/11 1200) SpO2:  [88 %-99 %] 99 % (06/11 1200)  No results for input(s): GLUCAP in the last 168 hours.  Recent Labs Lab 02/05/15 1508  02/06/15 0934 02/07/15 0550  02/07/15 2240 02/08/15 0335  02/10/15 1600  02/11/15 0415 02/11/15 1006 02/11/15 1600 02/11/15 2040 02/12/15 0400 02/12/15 1010  NA 138  --  138 137  < > 143 142  < > 146*  < > 150* 146* 146* 145 149* 148*  K 3.8  --  3.9 4.0  --  3.1* 3.3*  --  2.9*  --  3.0*  --   --   --   --   --   CL  --   --  103 99*  --  102 102  --   --   --   --   --   --   --   --   --   CO2  --   --  27 29  --  32 31  --   --   --   --   --   --   --   --   --   GLUCOSE 163*  --  124* 115*  --  111* 102*  --   --   --   --   --   --   --   --   --   BUN  --   --  10 10  --  8 6  --   --   --   --   --   --   --   --   --   CREATININE  --    --  0.53 0.53  --  0.53 0.45  --   --   --   --   --   --   --   --   --   CALCIUM  --   < > 8.4* 8.4*  --  8.3* 8.3*  --   --   --   --   --   --   --   --   --   MG  --   --   --   --   --  2.0  --   --   --   --   --   --   --   --   --   --   < > =  values in this interval not displayed. No results for input(s): AST, ALT, ALKPHOS, BILITOT, PROT, ALBUMIN in the last 168 hours.  Recent Labs Lab 02/05/15 1508 02/06/15 0934 02/07/15 0550 02/08/15 0335  WBC  --  13.8* 12.2* 11.9*  HGB 12.9 12.0 11.4* 12.0  HCT 38.0 36.4 34.7* 37.8  MCV  --  92.9 94.0 95.0  PLT  --  207 183 199    Recent Labs Lab 02/07/15 2240 02/08/15 0335 02/08/15 0905  TROPONINI <0.03 0.03 <0.03   No results for input(s): LABPROT, INR in the last 72 hours. No results for input(s): COLORURINE, LABSPEC, [place], GLUCOSEU, HGBUR, BILIRUBINUR, KETONESUR, PROTEINUR, UROBILINOGEN, NITRITE, LEUKOCYTESUR in the last 72 hours.  Invalid input(s): APPERANCEUR     Component Value Date/Time   CHOL 175 02/06/2015 0933   TRIG 117 02/06/2015 0933   HDL 51 02/06/2015 0933   CHOLHDL 3.4 02/06/2015 0933   VLDL 23 02/06/2015 0933   LDLCALC 101* 02/06/2015 0933   Lab Results  Component Value Date   HGBA1C 6.2* 02/06/2015   No results found for: LABOPIA, COCAINSCRNUR, LABBENZ, AMPHETMU, THCU, LABBARB  No results for input(s): ETH in the last 168 hours.  I have personally reviewed the radiological images below and agree with the radiology interpretations.  Ct Head Wo Contrast 02/05/2015    1) 4.0 cm acute hemorrhage within the right cerebellar hemisphere with surrounding edema and mass effect on the fourth ventricle. There is no hydrocephalus. The estimated volume is 25-30 mL. 2) Lacunar infarct of the right caudate head appears remote.  3) No acute supratentorial abnormality.    Ct Head Wo Contrast 02/06/2015    IMPRESSION: Post right occipital craniotomy for drainage of right cerebellar hematoma. Although there has been  debulking of the central aspect of the hematoma, right cerebellar hematoma remains spanning over 4.1 x 3.5 cm versus prior 4.4 x 3.5 cm.  Surrounding vasogenic edema and mass effect have progressed slightly. Compression of the fourth ventricle has progressed since the prior exam. Right cerebellar tonsil extension into the foramen magnum and upward herniation of the right cerebellum. Compression of the right aspect of the quadrigeminal plate has progressed slightly. Ventricular size including asymmetric mild prominence of the left temporal horn relatively similar to prior exam. Patient is at risk for development of hydrocephalus. Close follow-up recommended.     02/08/2015   IMPRESSION: Interval development of mild hydrocephalus post partial drainage of right cerebellar hematoma. Clinical correlation and close CT monitoring recommended.  Change in configuration of the right cerebellar hematoma at as noted above. Surrounding vasogenic edema and mass effect including downward mild herniation of the right cerebellar tonsil and moderate upward herniation of the right vermis and cerebellum with compression of the fourth ventricle, aqueduct, brainstem and quadrigeminal plate.    02/10/2015   IMPRESSION: Right cerebellar hematoma is slightly larger compared to the prior study. There is mass-effect on the fourth ventricle and brainstem and mild hydrocephalus which is stable.     Dg Chest Portable 1 View 02/05/2015    No acute cardiopulmonary disease.   2D echo - - Procedure narrative: Transthoracic echocardiography. Imagequality was poor. The study was technically difficult, as aresult of poor acoustic windows, poor sound wave transmission,and restricted patient mobility. - Left ventricle: The cavity size was normal. Wall thickness wasincreased in a pattern of mild LVH. Systolic function wasvigorous. The estimated ejection fraction was in the range of 65%to 70%. Wall motion was normal; there were no regional  wallmotion abnormalities. Doppler parameters are  consistent withabnormal left ventricular relaxation (grade 1 diastolicdysfunction). The E/e&' ratio is between 8-15, suggestingindeterminate LV filling pressure. - Aortic valve: Poorly visualized. There was no stenosis. There wasno regurgitation. - Mitral valve: Calcified annulus. There was trivial regurgitation. - Left atrium: The atrium was normal in size. - Right atrium: The atrium was mildly dilated.   PHYSICAL EXAM Temp:  [97.6 F (36.4 C)-98.4 F (36.9 C)] 97.7 F (36.5 C) (06/11 1210) Pulse Rate:  [25-130] 61 (06/11 1200) Resp:  [16-32] 20 (06/11 1200) BP: (102-179)/(58-115) 129/68 mmHg (06/11 1200) SpO2:  [88 %-99 %] 99 % (06/11 1200)  General - Well nourished, well developed, in no apparent distress.  Ophthalmologic - Fundi not visualized due to eye movement and nystagmus.  Cardiovascular - Regular rate and rhythm.  Mental Status -  Level of arousal and orientation to time, place, and person were intact. Language including expression, naming, repetition, comprehension was assessed and found intact. Fund of Knowledge was assessed and was intact.  Cranial Nerves II - XII - II - Visual field intact OU. III, IV, VI - Extraocular movements showed mild right gaze difficulty with mild right CN VI palsy. V - Facial sensation intact bilaterally. VII - right lower facial droop VIII - Hearing & vestibular intact bilaterally, horizontal nystagmus on right side gaze, rotational nystagmus on upward and downward gaze. X - Palate elevates symmetrically. XI - Chin turning & shoulder shrug intact bilaterally. XII - Tongue protrusion intact.  Motor Strength - The patient's strength was normal in all extremities and pronator drift was absent.  Bulk was normal and fasciculations were absent.   Motor Tone - Muscle tone was assessed at the neck and appendages and was normal.  Reflexes - The patient's reflexes were 1+ in all extremities  and she had no pathological reflexes.  Sensory - Light touch, temperature/pinprick were assessed and were symmetrical.    Coordination - The patient had ataxia on the right FTN and HTS, intact on the left.  Tremor was absent.  Gait and Station - not tested due to safety concerns.   ASSESSMENT/PLAN [name] is a 79 y.o. female with history of COPD, breast cancer on remission, bladder cancer s/p chemotherapy and surgery, paroxysmal A. fib on aspirin and the CHF presenting with ataxia, vertigo and nausea vomiting. CT showed right cerebellar ICH. She received right cerebellar hematoma evacuation.     Right cerebellar ICH:  S/p hematoma evacuation. Etiology not clear, HTN, vs. CAA vs. AVM vs. Brain mets. Patient blood pressure well controlled at home and after admission, and malignancy also in remission. Will need follow-up MRI,    Resultant  diplopia, nystagmus, ataxia. Overall neuro staus improved.  CT showed right cerebellar hemorrhage  Repeat CT showed mild hydrocephalus but stable  2D Echo unremarkable  LDL 101  HgbA1c 6.2  Heparin subcutaneous for VTE prophylaxis Diet regular Room service appropriate?: Yes; Fluid consistency:: Thin  aspirin 81 mg orally every day prior to admission, now on no antithrombotic  Ongoing aggressive stroke risk factor management  Therapy recommendations:  CIR  Disposition:  Pending  Cerebellar edema, induced hypernatremia   Due to cerebellar hematoma  Put on 3% saline for cerebral edema  Goal Na 150-160  Na Q6  Sodium today 147->150   Close watch for hydrocephalus  Paroxysmal Afib with RVR  Cardiology on board  On metoprolol and cardizem  Rate still uncontrolled  On aspirin 81 at home  Patient is not anticoagulation candidate due to spontaneous cerebellar hemorrhage.  Hypertension  BP stable 110-150  Pt denies hx of HTN  Lisinopril switched to metoprolol  Currently metoprolol 30m  bid  Hyperlipidemia  LDL 101, goal < 70  Add Lipitor 10  Continue statin at discharge  Other Stroke Risk Factors  Advanced age  Former smoker for 40 years  Breast cancer in remission  Bladder cancer were treated last December  CHF  Other Active Problems  Leukocytosis  Hospital day # 7  Plan Taper 3% saline drip and discontinue after 24 hours. Repeat CT scan of the head in the a.m. and if clinically and radiologically stable will transfer to the floor and to the rehabilitation unit over the next 2-3 days. Long discussion with the patient, husband and daughter regarding her prognosis, risk for recurrent strokes and answered questions This patient is critically ill due to right cerebellar ICH s/p evacuation, A. fib with RVR, mild hydrocephalus and at significant risk of neurological worsening, death form cerebellar edema, transtentorial herniation, obstructive hydrocephalus and respiratory failure. This patient's care requires constant monitoring of vital signs, hemodynamics, respiratory and cardiac monitoring, review of multiple databases, neurological assessment, discussion with family, other specialists and medical decision making of high complexity. I spent 35 minutes of neurocritical care time in the care of this patient. PAntony Contras MD Stroke Neurology 02/12/2015 12:26 PM   To contact Stroke Continuity provider, please refer to [email or web address] After hours, contact General Neurology

## 2015-02-13 ENCOUNTER — Inpatient Hospital Stay (HOSPITAL_COMMUNITY): Payer: Medicare Other

## 2015-02-13 LAB — SODIUM: Sodium: 147 mmol/L — ABNORMAL HIGH (ref 135–145)

## 2015-02-13 MED ORDER — METOPROLOL TARTRATE 50 MG PO TABS
50.0000 mg | ORAL_TABLET | Freq: Three times a day (TID) | ORAL | Status: DC
  Administered 2015-02-13 – 2015-02-14 (×4): 50 mg via ORAL
  Filled 2015-02-13 (×5): qty 1

## 2015-02-13 NOTE — Progress Notes (Signed)
Patient ID: [name], female   DOB: 1935-01-22, 80 y.o.   MRN: [id number]    Subjective: Denies CP or dyspnea. No palpitations   Objective: Vital signs in last 24 hours: Temp:  [97.7 F (36.5 C)-98.8 F (37.1 C)] 98.1 F (36.7 C) (06/12 0800) Pulse Rate:  [61-117] 117 (06/12 1004) Resp:  [16-42] 19 (06/12 1004) BP: (99-169)/(61-97) 169/88 mmHg (06/12 1004) SpO2:  [93 %-100 %] 96 % (06/12 1004) Last BM Date: 02/12/15   Telemetry:  02/13/2015  afib rates 115-123  Intake/Output from previous day: 06/11 0701 - 06/12 0700 In: 2162.5 [P.O.:1080; I.V.:1082.5] Out: 670 [Urine:670] Intake/Output this shift: Total I/O In: 290 [P.O.:240; I.V.:50] Out: 425 [Urine:425]  Medications Scheduled Meds: . antiseptic oral rinse  7 mL Mouth Rinse BID  . atorvastatin  10 mg Oral q1800  . bethanechol  25 mg Oral TID  . diltiazem  60 mg Oral QID  . docusate sodium  100 mg Oral BID  . heparin subcutaneous  5,000 Units Subcutaneous 3 times per day  . latanoprost  1 drop Both Eyes QHS  . metoprolol tartrate  50 mg Oral BID  . pantoprazole  40 mg Oral Daily  . senna  1 tablet Oral BID  . senna-docusate  1 tablet Oral BID   Continuous Infusions: . sodium chloride (hypertonic) Stopped (02/13/15 0918)   PRN Meds:.acetaminophen **OR** acetaminophen, bisacodyl, labetalol, levalbuterol, ondansetron **OR** ondansetron (ZOFRAN) IV, promethazine  PE: General appearance: alert, cooperative and no distress Lungs: clear to auscultation bilaterally Heart: irregularly irregular rhythm and tachycardic Abdomen: +BS, nontender Extremities: -LEE Skin: Warm and dry Neurologic: Grossly normal   BMET  Recent Labs  02/10/15 1600  02/11/15 0415  02/12/15 1525 02/12/15 2050 02/13/15 0240  NA 146*  < > 150*  < > 147* 145 147*  K 2.9*  --  3.0*  --   --   --   --   < > = values in this interval not displayed.   Assessment/Plan  80 year old mild to moderately overweight Caucasian female who  was admitted on 02/05/15 with a cerebellar hemorrhage. She has a history of hypertension in the past. She has COPD and history of breast cancer. She's reports a remote history of PAF in the setting of knee replacement which spontaneously converted. Her current hospitalization has been complicated by atrial fibrillation w/ RVR.    PAF: in the setting of acute neurologic illness. 2D echo is normal. She remains in afib but rate 90-1130   On Cardizem 13m Q6hr and metoprolol  50 mg BID. Given her hemorrhage, she is not a candidate for systemic anticoagulation. Increase lopressor to 50 tid.  Ok to go to rehab from cardiac standpoint   PJenkins Rouge MD

## 2015-02-13 NOTE — Progress Notes (Signed)
Subjective: Patient reports no significant headache. She is awake and alert and pleasant and interactive. Moves all extremities well.  Objective: Vital signs in last 24 hours: Temp:  [97.7 F (36.5 C)-98.8 F (37.1 C)] 98.1 F (36.7 C) (06/12 0800) Pulse Rate:  [41-130] 114 (06/12 0800) Resp:  [16-42] 21 (06/12 0800) BP: (99-160)/(58-97) 160/97 mmHg (06/12 0800) SpO2:  [93 %-100 %] 100 % (06/12 0800)  Intake/Output from previous day: 06/11 0730 - 06/12 0729 In: 2162.5 [P.O.:1080; I.V.:1082.5] Out: 670 [Urine:670] Intake/Output this shift: Total I/O In: 25 [I.V.:25] Out: 200 [Urine:200]  Neurologic: Grossly normal  Lab Results: Lab Results  Component Value Date   WBC 11.9* 02/08/2015   HGB 12.0 02/08/2015   HCT 37.8 02/08/2015   MCV 95.0 02/08/2015   PLT 199 02/08/2015   Lab Results  Component Value Date   INR 1.06 02/05/2015   BMET Lab Results  Component Value Date   NA 147* 02/13/2015   K 3.0* 02/11/2015   CL 102 02/08/2015   CO2 31 02/08/2015   GLUCOSE 102* 02/08/2015   BUN 6 02/08/2015   CREATININE 0.45 02/08/2015   CALCIUM 8.3* 02/08/2015    Studies/Results: Ct Head Wo Contrast  02/13/2015   CLINICAL DATA:  Hemorrhagic stroke, follow-up.  EXAM: CT HEAD WITHOUT CONTRAST  TECHNIQUE: Contiguous axial images were obtained from the base of the skull through the vertex without intravenous contrast.  COMPARISON:  CT head February 10, 2015  FINDINGS: Evolving 4.3 x 3.1 cm hematoma was 4 x 9 x 3.5 cm. Surrounding low-density vasogenic edema with persistent effacement of the fourth ventricle. Partially effaced super cerebellar cistern. Anterior recess of the third ventricle is 13 mm, previously 15 mm. Mild interstitial edema. Remote RIGHT caudate body lacunar infarct. No acute large vascular territory infarct. Patchy white matter changes most consistent chronic small vessel ischemic disease.  No abnormal extra-axial fluid collections. Moderate calcific atherosclerosis of the  carotid siphons. Status post bilateral ocular lens implants. Recent RIGHT retro sigmoid craniotomy. Overlying soft tissue swelling with minimal residual subcutaneous gas.  IMPRESSION: Evolving, contracting RIGHT cerebellar intraparenchymal hematoma with mild, improved obstructive hydrocephalus.   Electronically Signed   By: Elon Alas M.D.   On: 02/13/2015 06:11    Assessment/Plan: Doing well. Continue current management   LOS: 8 days    [name] S 02/13/2015, 8:41 AM

## 2015-02-13 NOTE — Progress Notes (Signed)
STROKE TEAM PROGRESS NOTE   HISTORY [name] is an 80 y.o. female with a history of congestive heart failure, COPD, breast cancer, transient atrial fibrillation and hypertension, presenting with acute onset of ataxia with vertigo, nausea and blurred vision at 6 AM this morning. She has been on aspirin 81 mg most days. She has not been on anticoagulation. CT scan of her head showed a large right hemispheric cerebellar hematoma measuring 4.0 x 3.6 mm. There was rounding edema with significant mass effect on the fourth ventricle. There were no signs of hydrocephalus. NIH stroke score was 4.  LSN: 6 AM on 02/05/2015 tPA Given: No: Acute cerebellar hemorrhage   SUBJECTIVE (INTERVAL HISTORY) Daughter and husband at the bedside. Patient feels better, no complaints. Na 147 and 3% saline almost off. Still has afib with RVR, metoporolol dose increased.   OBJECTIVE Temp:  [97.7 F (36.5 C)-98.8 F (37.1 C)] 98.1 F (36.7 C) (06/12 0800) Pulse Rate:  [61-117] 117 (06/12 1004) Cardiac Rhythm:  [-] Atrial fibrillation (06/12 0800) Resp:  [16-42] 19 (06/12 1004) BP: (99-169)/(61-97) 169/88 mmHg (06/12 1004) SpO2:  [93 %-100 %] 96 % (06/12 1004)  No results for input(s): GLUCAP in the last 168 hours.  Recent Labs Lab 02/07/15 0550  02/07/15 2240 02/08/15 0335  02/10/15 1600  02/11/15 0415  02/12/15 0400 02/12/15 1010 02/12/15 1525 02/12/15 2050 02/13/15 0240  NA 137  < > 143 142  < > 146*  < > 150*  < > 149* 148* 147* 145 147*  K 4.0  --  3.1* 3.3*  --  2.9*  --  3.0*  --   --   --   --   --   --   CL 99*  --  102 102  --   --   --   --   --   --   --   --   --   --   CO2 29  --  32 31  --   --   --   --   --   --   --   --   --   --   GLUCOSE 115*  --  111* 102*  --   --   --   --   --   --   --   --   --   --   BUN 10  --  8 6  --   --   --   --   --   --   --   --   --   --   CREATININE 0.53  --  0.53 0.45  --   --   --   --   --   --   --   --   --   --   CALCIUM 8.4*  --   8.3* 8.3*  --   --   --   --   --   --   --   --   --   --   MG  --   --  2.0  --   --   --   --   --   --   --   --   --   --   --   < > = values in this interval not displayed. No results for input(s): AST, ALT, ALKPHOS, BILITOT, PROT, ALBUMIN in the last 168 hours.  Recent Labs Lab 02/07/15 0550 02/08/15 0335  WBC 12.2* 11.9*  HGB 11.4* 12.0  HCT 34.7* 37.8  MCV 94.0 95.0  PLT 183 199    Recent Labs Lab 02/07/15 2240 02/08/15 0335 02/08/15 0905  TROPONINI <0.03 0.03 <0.03   No results for input(s): LABPROT, INR in the last 72 hours. No results for input(s): COLORURINE, LABSPEC, [place], GLUCOSEU, HGBUR, BILIRUBINUR, KETONESUR, PROTEINUR, UROBILINOGEN, NITRITE, LEUKOCYTESUR in the last 72 hours.  Invalid input(s): APPERANCEUR     Component Value Date/Time   CHOL 175 02/06/2015 0933   TRIG 117 02/06/2015 0933   HDL 51 02/06/2015 0933   CHOLHDL 3.4 02/06/2015 0933   VLDL 23 02/06/2015 0933   LDLCALC 101* 02/06/2015 0933   Lab Results  Component Value Date   HGBA1C 6.2* 02/06/2015   No results found for: LABOPIA, COCAINSCRNUR, LABBENZ, AMPHETMU, THCU, LABBARB  No results for input(s): ETH in the last 168 hours.  I have personally reviewed the radiological images below and agree with the radiology interpretations.  Ct Head Wo Contrast 02/05/2015    1) 4.0 cm acute hemorrhage within the right cerebellar hemisphere with surrounding edema and mass effect on the fourth ventricle. There is no hydrocephalus. The estimated volume is 25-30 mL. 2) Lacunar infarct of the right caudate head appears remote.  3) No acute supratentorial abnormality.    Ct Head Wo Contrast 02/06/2015    IMPRESSION: Post right occipital craniotomy for drainage of right cerebellar hematoma. Although there has been debulking of the central aspect of the hematoma, right cerebellar hematoma remains spanning over 4.1 x 3.5 cm versus prior 4.4 x 3.5 cm.  Surrounding vasogenic edema and mass effect have  progressed slightly. Compression of the fourth ventricle has progressed since the prior exam. Right cerebellar tonsil extension into the foramen magnum and upward herniation of the right cerebellum. Compression of the right aspect of the quadrigeminal plate has progressed slightly. Ventricular size including asymmetric mild prominence of the left temporal horn relatively similar to prior exam. Patient is at risk for development of hydrocephalus. Close follow-up recommended.     02/08/2015   IMPRESSION: Interval development of mild hydrocephalus post partial drainage of right cerebellar hematoma. Clinical correlation and close CT monitoring recommended.  Change in configuration of the right cerebellar hematoma at as noted above. Surrounding vasogenic edema and mass effect including downward mild herniation of the right cerebellar tonsil and moderate upward herniation of the right vermis and cerebellum with compression of the fourth ventricle, aqueduct, brainstem and quadrigeminal plate.    02/10/2015   IMPRESSION: Right cerebellar hematoma is slightly larger compared to the prior study. There is mass-effect on the fourth ventricle and brainstem and mild hydrocephalus which is stable.     02/13/2015 : Evolving, contracting RIGHT cerebellar intraparenchymal hematoma with mild, improved obstructive hydrocephalus  Dg Chest Portable 1 View 02/05/2015    No acute cardiopulmonary disease.   2D echo - - Procedure narrative: Transthoracic echocardiography. Imagequality was poor. The study was technically difficult, as aresult of poor acoustic windows, poor sound wave transmission,and restricted patient mobility. - Left ventricle: The cavity size was normal. Wall thickness wasincreased in a pattern of mild LVH. Systolic function wasvigorous. The estimated ejection fraction was in the range of 65%to 70%. Wall motion was normal; there were no regional wallmotion abnormalities. Doppler parameters are consistent  withabnormal left ventricular relaxation (grade 1 diastolicdysfunction). The E/e&' ratio is between 8-15, suggestingindeterminate LV filling pressure. - Aortic valve: Poorly visualized. There was no stenosis. There wasno regurgitation. - Mitral valve: Calcified annulus. There  was trivial regurgitation. - Left atrium: The atrium was normal in size. - Right atrium: The atrium was mildly dilated.   PHYSICAL EXAM Temp:  [97.7 F (36.5 C)-98.8 F (37.1 C)] 98.1 F (36.7 C) (06/12 0800) Pulse Rate:  [61-117] 117 (06/12 1004) Resp:  [16-42] 19 (06/12 1004) BP: (99-169)/(61-97) 169/88 mmHg (06/12 1004) SpO2:  [93 %-100 %] 96 % (06/12 1004)  General - Well nourished, well developed pleasant elderly Caucasian lady, in no apparent distress.  Ophthalmologic - Fundi not visualized due to eye movement and nystagmus.  Cardiovascular - Regular rate and rhythm.  Mental Status -  Level of arousal and orientation to time, place, and person were intact. Language including expression, naming, repetition, comprehension was assessed and found intact. Fund of Knowledge was assessed and was intact.  Cranial Nerves II - XII - II - Visual field intact OU. III, IV, VI - Extraocular movements showed mild right gaze difficulty with mild right CN VI palsy. Right beating nystagmus on right gaze V - Facial sensation intact bilaterally. VII - right lower facial droop VIII - Hearing & vestibular intact bilaterally, horizontal nystagmus on right side gaze, rotational nystagmus on upward and downward gaze. X - Palate elevates symmetrically. XI - Chin turning & shoulder shrug intact bilaterally. XII - Tongue protrusion intact.  Motor Strength - The patient's strength was normal in all extremities and pronator drift was absent.  Bulk was normal and fasciculations were absent.   Motor Tone - Muscle tone was assessed at the neck and appendages and was normal.  Reflexes - The patient's reflexes were 1+ in all  extremities and she had no pathological reflexes.  Sensory - Light touch, temperature/pinprick were assessed and were symmetrical.    Coordination - The patient had ataxia on the right FTN and HTS, intact on the left.  Tremor was absent.  Gait and Station - not tested due to safety concerns.   ASSESSMENT/PLAN [name] is a 80 y.o. female with history of COPD, breast cancer on remission, bladder cancer s/p chemotherapy and surgery, paroxysmal A. fib on aspirin and the CHF presenting with ataxia, vertigo and nausea vomiting. CT showed right cerebellar ICH. She received right cerebellar hematoma evacuation.     Right cerebellar ICH:  S/p hematoma evacuation. Etiology not clear, HTN, vs. CAA vs. AVM vs. Brain mets. Patient blood pressure well controlled at home and after admission, and malignancy also in remission. Will need follow-up MRI,    Resultant  diplopia, nystagmus, ataxia. Overall neuro staus improved.  CT showed right cerebellar hemorrhage  Repeat CT showed mild hydrocephalus but stable  2D Echo unremarkable  LDL 101  HgbA1c 6.2  Heparin subcutaneous for VTE prophylaxis Diet regular Room service appropriate?: Yes; Fluid consistency:: Thin  aspirin 81 mg orally every day prior to admission, now on no antithrombotic  Ongoing aggressive stroke risk factor management  Therapy recommendations:  CIR  Disposition:  Pending  Cerebellar edema, induced hypernatremia   Due to cerebellar hematoma  Put on 3% saline for cerebral edema  Goal Na 150-160  Na Q6  Sodium today 147->150   Close watch for hydrocephalus  Paroxysmal Afib with RVR  Cardiology on board  On metoprolol and cardizem  Rate still uncontrolled  On aspirin 81 at home  Patient is not anticoagulation candidate due to spontaneous cerebellar hemorrhage.   Hypertension  BP stable 110-150  Pt denies hx of HTN  Lisinopril switched to metoprolol  Currently metoprolol 64m  bid  Hyperlipidemia  LDL 101, goal < 70  Add Lipitor 10  Continue statin at discharge  Other Stroke Risk Factors  Advanced age  Former smoker for 40 years  Breast cancer in remission  Bladder cancer were treated last December  CHF  Other Active Problems  Leukocytosis  Hospital day # 8  Plan Discontinue 3% saline drip  . Repeat CT scan of the head shows expected evolution of hemorrhage and  radiologically stable ventricular size will transfer to  the rehabilitation unit over tomorrow. Long discussion with the patient, husband and daughter regarding her prognosis, risk for recurrent strokes and answered questions This patient is critically ill due to right cerebellar ICH s/p evacuation, A. fib with RVR, mild hydrocephalus and at significant risk of neurological worsening, death form cerebellar edema, transtentorial herniation, obstructive hydrocephalus and respiratory failure. This patient's care requires constant monitoring of vital signs, hemodynamics, respiratory and cardiac monitoring, review of multiple databases, neurological assessment, discussion with family, other specialists and medical decision making of high complexity. I spent 30 minutes of neurocritical care time in the care of this patient. Antony Contras, MD Stroke Neurology 02/13/2015 10:54 AM   To contact Stroke Continuity provider, please refer to [email or web address] After hours, contact General Neurology

## 2015-02-14 ENCOUNTER — Inpatient Hospital Stay (HOSPITAL_COMMUNITY)
Admission: RE | Admit: 2015-02-14 | Discharge: 2015-02-20 | DRG: 057 | Disposition: A | Discharge status: Other Acute Inpatient Hospital | Payer: Medicare Other | Source: Intra-hospital | Attending: Physical Medicine & Rehabilitation | Admitting: Physical Medicine & Rehabilitation

## 2015-02-14 ENCOUNTER — Encounter (HOSPITAL_COMMUNITY): Payer: Self-pay | Admitting: Rehabilitation

## 2015-02-14 DIAGNOSIS — N390 Urinary tract infection, site not specified: Secondary | ICD-10-CM | POA: Diagnosis not present

## 2015-02-14 DIAGNOSIS — Z96651 Presence of right artificial knee joint: Secondary | ICD-10-CM | POA: Diagnosis not present

## 2015-02-14 DIAGNOSIS — Z79899 Other long term (current) drug therapy: Secondary | ICD-10-CM

## 2015-02-14 DIAGNOSIS — R270 Ataxia, unspecified: Secondary | ICD-10-CM | POA: Diagnosis not present

## 2015-02-14 DIAGNOSIS — C679 Malignant neoplasm of bladder, unspecified: Secondary | ICD-10-CM | POA: Diagnosis present

## 2015-02-14 DIAGNOSIS — I5042 Chronic combined systolic (congestive) and diastolic (congestive) heart failure: Secondary | ICD-10-CM | POA: Diagnosis not present

## 2015-02-14 DIAGNOSIS — I5032 Chronic diastolic (congestive) heart failure: Secondary | ICD-10-CM | POA: Diagnosis present

## 2015-02-14 DIAGNOSIS — Z9011 Acquired absence of right breast and nipple: Secondary | ICD-10-CM

## 2015-02-14 DIAGNOSIS — E876 Hypokalemia: Secondary | ICD-10-CM | POA: Diagnosis present

## 2015-02-14 DIAGNOSIS — E785 Hyperlipidemia, unspecified: Secondary | ICD-10-CM | POA: Diagnosis present

## 2015-02-14 DIAGNOSIS — J449 Chronic obstructive pulmonary disease, unspecified: Secondary | ICD-10-CM | POA: Diagnosis not present

## 2015-02-14 DIAGNOSIS — K219 Gastro-esophageal reflux disease without esophagitis: Secondary | ICD-10-CM | POA: Diagnosis not present

## 2015-02-14 DIAGNOSIS — I4891 Unspecified atrial fibrillation: Secondary | ICD-10-CM | POA: Diagnosis present

## 2015-02-14 DIAGNOSIS — I481 Persistent atrial fibrillation: Secondary | ICD-10-CM | POA: Diagnosis not present

## 2015-02-14 DIAGNOSIS — I69293 Ataxia following other nontraumatic intracranial hemorrhage: Principal | ICD-10-CM

## 2015-02-14 DIAGNOSIS — H409 Unspecified glaucoma: Secondary | ICD-10-CM | POA: Diagnosis present

## 2015-02-14 DIAGNOSIS — Z853 Personal history of malignant neoplasm of breast: Secondary | ICD-10-CM | POA: Diagnosis not present

## 2015-02-14 DIAGNOSIS — Z7982 Long term (current) use of aspirin: Secondary | ICD-10-CM

## 2015-02-14 DIAGNOSIS — K5900 Constipation, unspecified: Secondary | ICD-10-CM | POA: Diagnosis not present

## 2015-02-14 DIAGNOSIS — I10 Essential (primary) hypertension: Secondary | ICD-10-CM | POA: Diagnosis not present

## 2015-02-14 DIAGNOSIS — I619 Nontraumatic intracerebral hemorrhage, unspecified: Secondary | ICD-10-CM | POA: Diagnosis not present

## 2015-02-14 DIAGNOSIS — I614 Nontraumatic intracerebral hemorrhage in cerebellum: Secondary | ICD-10-CM | POA: Diagnosis present

## 2015-02-14 LAB — CBC
HCT: 36.1 % (ref 36.0–46.0)
Hemoglobin: 11.9 g/dL — ABNORMAL LOW (ref 12.0–15.0)
MCH: 30.8 pg (ref 26.0–34.0)
MCHC: 33 g/dL (ref 30.0–36.0)
MCV: 93.5 fL (ref 78.0–100.0)
Platelets: 269 10*3/uL (ref 150–400)
RBC: 3.86 MIL/uL — ABNORMAL LOW (ref 3.87–5.11)
RDW: 14.2 % (ref 11.5–15.5)
WBC: 10.9 10*3/uL — ABNORMAL HIGH (ref 4.0–10.5)

## 2015-02-14 LAB — SODIUM: Sodium: 142 mmol/L (ref 135–145)

## 2015-02-14 MED ORDER — SENNA 8.6 MG PO TABS
1.0000 | ORAL_TABLET | Freq: Two times a day (BID) | ORAL | Status: DC
  Administered 2015-02-15 – 2015-02-18 (×7): 8.6 mg via ORAL
  Filled 2015-02-14 (×14): qty 1

## 2015-02-14 MED ORDER — ACETAMINOPHEN 325 MG PO TABS
650.0000 mg | ORAL_TABLET | ORAL | Status: DC | PRN
  Administered 2015-02-14 – 2015-02-20 (×12): 650 mg via ORAL
  Filled 2015-02-14 (×13): qty 2

## 2015-02-14 MED ORDER — DILTIAZEM HCL 60 MG PO TABS
60.0000 mg | ORAL_TABLET | Freq: Four times a day (QID) | ORAL | Status: DC
  Administered 2015-02-14 – 2015-02-20 (×16): 60 mg via ORAL
  Filled 2015-02-14 (×26): qty 1

## 2015-02-14 MED ORDER — HEPARIN SODIUM (PORCINE) 5000 UNIT/ML IJ SOLN: 5000.0000 [IU] | Freq: Three times a day (TID) | INTRAMUSCULAR | Status: DC

## 2015-02-14 MED ORDER — BETHANECHOL CHLORIDE 25 MG PO TABS: 25.0000 mg | ORAL_TABLET | Freq: Three times a day (TID) | ORAL | Status: DC

## 2015-02-14 MED ORDER — LEVALBUTEROL HCL 0.63 MG/3ML IN NEBU
0.6300 mg | INHALATION_SOLUTION | RESPIRATORY_TRACT | Status: DC | PRN
  Filled 2015-02-14: qty 3

## 2015-02-14 MED ORDER — ACETAMINOPHEN 650 MG RE SUPP: 650.0000 mg | RECTAL | Status: DC | PRN

## 2015-02-14 MED ORDER — ONDANSETRON HCL 4 MG/2ML IJ SOLN: 4.0000 mg | Freq: Four times a day (QID) | INTRAMUSCULAR | Status: DC | PRN

## 2015-02-14 MED ORDER — DILTIAZEM HCL 60 MG PO TABS: 60.0000 mg | ORAL_TABLET | Freq: Four times a day (QID) | ORAL | Status: DC

## 2015-02-14 MED ORDER — LATANOPROST 0.005 % OP SOLN
1.0000 [drp] | Freq: Every day | OPHTHALMIC | Status: DC
  Administered 2015-02-14 – 2015-02-19 (×6): 1 [drp] via OPHTHALMIC
  Filled 2015-02-14: qty 2.5

## 2015-02-14 MED ORDER — CETYLPYRIDINIUM CHLORIDE 0.05 % MT LIQD
7.0000 mL | Freq: Two times a day (BID) | OROMUCOSAL | Status: DC
  Administered 2015-02-15 – 2015-02-20 (×7): 7 mL via OROMUCOSAL

## 2015-02-14 MED ORDER — ATORVASTATIN CALCIUM 10 MG PO TABS
10.0000 mg | ORAL_TABLET | Freq: Every day | ORAL | Status: DC
  Administered 2015-02-15 – 2015-02-19 (×5): 10 mg via ORAL
  Filled 2015-02-14 (×6): qty 1

## 2015-02-14 MED ORDER — METOPROLOL TARTRATE 50 MG PO TABS
50.0000 mg | ORAL_TABLET | Freq: Three times a day (TID) | ORAL | Status: DC
  Administered 2015-02-14 – 2015-02-20 (×10): 50 mg via ORAL
  Filled 2015-02-14 (×20): qty 1

## 2015-02-14 MED ORDER — METOPROLOL TARTRATE 50 MG PO TABS: 50.0000 mg | ORAL_TABLET | Freq: Three times a day (TID) | ORAL | Status: DC

## 2015-02-14 MED ORDER — BISACODYL 10 MG RE SUPP: 10.0000 mg | Freq: Every day | RECTAL | Status: DC | PRN

## 2015-02-14 MED ORDER — ATORVASTATIN CALCIUM 10 MG PO TABS: 10.0000 mg | ORAL_TABLET | Freq: Every day | ORAL | Status: DC

## 2015-02-14 MED ORDER — PANTOPRAZOLE SODIUM 40 MG PO TBEC
40.0000 mg | DELAYED_RELEASE_TABLET | Freq: Every day | ORAL | Status: DC
  Administered 2015-02-15 – 2015-02-20 (×6): 40 mg via ORAL
  Filled 2015-02-14 (×8): qty 1

## 2015-02-14 MED ORDER — SORBITOL 70 % SOLN
30.0000 mL | Freq: Every day | Status: DC | PRN
  Administered 2015-02-15: 30 mL via ORAL
  Filled 2015-02-14: qty 30

## 2015-02-14 MED ORDER — SODIUM CHLORIDE 0.9 % IJ SOLN
10.0000 mL | INTRAMUSCULAR | Status: DC | PRN
  Administered 2015-02-14 – 2015-02-15 (×2): 10 mL
  Filled 2015-02-14: qty 40

## 2015-02-14 MED ORDER — HEPARIN SODIUM (PORCINE) 5000 UNIT/ML IJ SOLN
5000.0000 [IU] | Freq: Three times a day (TID) | INTRAMUSCULAR | Status: DC
  Administered 2015-02-14 – 2015-02-20 (×18): 5000 [IU] via SUBCUTANEOUS
  Filled 2015-02-14 (×20): qty 1

## 2015-02-14 MED ORDER — DOCUSATE SODIUM 100 MG PO CAPS
100.0000 mg | ORAL_CAPSULE | Freq: Two times a day (BID) | ORAL | Status: DC
  Administered 2015-02-16 – 2015-02-20 (×7): 100 mg via ORAL
  Filled 2015-02-14 (×14): qty 1

## 2015-02-14 MED ORDER — ONDANSETRON HCL 4 MG PO TABS: 4.0000 mg | ORAL_TABLET | Freq: Four times a day (QID) | ORAL | Status: DC | PRN

## 2015-02-14 NOTE — Progress Notes (Signed)
STROKE TEAM PROGRESS NOTE   HISTORY [name] is an 80 y.o. female with a history of congestive heart failure, COPD, breast cancer, transient atrial fibrillation and hypertension, presenting with acute onset of ataxia with vertigo, nausea and blurred vision at 6 AM this morning. She has been on aspirin 81 mg most days. She has not been on anticoagulation. CT scan of her head showed a large right hemispheric cerebellar hematoma measuring 4.0 x 3.6 mm. There was rounding edema with significant mass effect on the fourth ventricle. There were no signs of hydrocephalus. NIH stroke score was 4.  LSN: 6 AM on 02/05/2015 tPA Given: No: Acute cerebellar hemorrhage   SUBJECTIVE (INTERVAL HISTORY) Daughter and husband at the bedside. Patient feels better, no complaints. Na 142 and 3% saline   off. Still has afib with RVR, metoporolol dose increased and cardiology following.   OBJECTIVE Temp:  [97 F (36.1 C)-98.2 F (36.8 C)] 98.2 F (36.8 C) (06/13 1139) Pulse Rate:  [1-123] 95 (06/13 1200) Cardiac Rhythm:  [-] Atrial fibrillation (06/13 1100) Resp:  [18-24] 20 (06/13 1200) BP: (94-154)/(57-111) 110/70 mmHg (06/13 1200) SpO2:  [93 %-95 %] 95 % (06/13 1200)  No results for input(s): GLUCAP in the last 168 hours.  Recent Labs Lab 02/07/15 2240 02/08/15 0335  02/10/15 1600  02/11/15 0415  02/12/15 1010 02/12/15 1525 02/12/15 2050 02/13/15 0240 02/14/15 0540  NA 143 142  < > 146*  < > 150*  < > 148* 147* 145 147* 142  K 3.1* 3.3*  --  2.9*  --  3.0*  --   --   --   --   --   --   CL 102 102  --   --   --   --   --   --   --   --   --   --   CO2 32 31  --   --   --   --   --   --   --   --   --   --   GLUCOSE 111* 102*  --   --   --   --   --   --   --   --   --   --   BUN 8 6  --   --   --   --   --   --   --   --   --   --   CREATININE 0.53 0.45  --   --   --   --   --   --   --   --   --   --   CALCIUM 8.3* 8.3*  --   --   --   --   --   --   --   --   --   --   MG 2.0  --    --   --   --   --   --   --   --   --   --   --   < > = values in this interval not displayed. No results for input(s): AST, ALT, ALKPHOS, BILITOT, PROT, ALBUMIN in the last 168 hours.  Recent Labs Lab 02/08/15 0335  WBC 11.9*  HGB 12.0  HCT 37.8  MCV 95.0  PLT 199    Recent Labs Lab 02/07/15 2240 02/08/15 0335 02/08/15 0905  TROPONINI <0.03 0.03 <0.03   No results for input(s): LABPROT,  INR in the last 72 hours. No results for input(s): COLORURINE, LABSPEC, [place], GLUCOSEU, HGBUR, BILIRUBINUR, KETONESUR, PROTEINUR, UROBILINOGEN, NITRITE, LEUKOCYTESUR in the last 72 hours.  Invalid input(s): APPERANCEUR     Component Value Date/Time   CHOL 175 02/06/2015 0933   TRIG 117 02/06/2015 0933   HDL 51 02/06/2015 0933   CHOLHDL 3.4 02/06/2015 0933   VLDL 23 02/06/2015 0933   LDLCALC 101* 02/06/2015 0933   Lab Results  Component Value Date   HGBA1C 6.2* 02/06/2015   No results found for: LABOPIA, COCAINSCRNUR, LABBENZ, AMPHETMU, THCU, LABBARB  No results for input(s): ETH in the last 168 hours.  I have personally reviewed the radiological images below and agree with the radiology interpretations.  Ct Head Wo Contrast 02/05/2015    1) 4.0 cm acute hemorrhage within the right cerebellar hemisphere with surrounding edema and mass effect on the fourth ventricle. There is no hydrocephalus. The estimated volume is 25-30 mL. 2) Lacunar infarct of the right caudate head appears remote.  3) No acute supratentorial abnormality.    Ct Head Wo Contrast 02/06/2015    IMPRESSION: Post right occipital craniotomy for drainage of right cerebellar hematoma. Although there has been debulking of the central aspect of the hematoma, right cerebellar hematoma remains spanning over 4.1 x 3.5 cm versus prior 4.4 x 3.5 cm.  Surrounding vasogenic edema and mass effect have progressed slightly. Compression of the fourth ventricle has progressed since the prior exam. Right cerebellar tonsil extension into  the foramen magnum and upward herniation of the right cerebellum. Compression of the right aspect of the quadrigeminal plate has progressed slightly. Ventricular size including asymmetric mild prominence of the left temporal horn relatively similar to prior exam. Patient is at risk for development of hydrocephalus. Close follow-up recommended.     02/08/2015   IMPRESSION: Interval development of mild hydrocephalus post partial drainage of right cerebellar hematoma. Clinical correlation and close CT monitoring recommended.  Change in configuration of the right cerebellar hematoma at as noted above. Surrounding vasogenic edema and mass effect including downward mild herniation of the right cerebellar tonsil and moderate upward herniation of the right vermis and cerebellum with compression of the fourth ventricle, aqueduct, brainstem and quadrigeminal plate.    02/10/2015   IMPRESSION: Right cerebellar hematoma is slightly larger compared to the prior study. There is mass-effect on the fourth ventricle and brainstem and mild hydrocephalus which is stable.     02/13/2015 : Evolving, contracting RIGHT cerebellar intraparenchymal hematoma with mild, improved obstructive hydrocephalus  Dg Chest Portable 1 View 02/05/2015    No acute cardiopulmonary disease.   2D echo - - Procedure narrative: Transthoracic echocardiography. Imagequality was poor. The study was technically difficult, as aresult of poor acoustic windows, poor sound wave transmission,and restricted patient mobility. - Left ventricle: The cavity size was normal. Wall thickness wasincreased in a pattern of mild LVH. Systolic function wasvigorous. The estimated ejection fraction was in the range of 65%to 70%. Wall motion was normal; there were no regional wallmotion abnormalities. Doppler parameters are consistent withabnormal left ventricular relaxation (grade 1 diastolicdysfunction). The E/e&' ratio is between 8-15, suggestingindeterminate LV  filling pressure. - Aortic valve: Poorly visualized. There was no stenosis. There wasno regurgitation. - Mitral valve: Calcified annulus. There was trivial regurgitation. - Left atrium: The atrium was normal in size. - Right atrium: The atrium was mildly dilated.   PHYSICAL EXAM Temp:  [97 F (36.1 C)-98.2 F (36.8 C)] 98.2 F (36.8 C) (06/13 1139) Pulse Rate:  [1-123]  95 (06/13 1200) Resp:  [18-24] 20 (06/13 1200) BP: (94-154)/(57-111) 110/70 mmHg (06/13 1200) SpO2:  [93 %-95 %] 95 % (06/13 1200)  General - Well nourished, well developed pleasant elderly Caucasian lady, in no apparent distress.  Ophthalmologic - Fundi not visualized due to eye movement and nystagmus.  Cardiovascular - Regular rate and rhythm.  Mental Status -  Level of arousal and orientation to time, place, and person were intact. Language including expression, naming, repetition, comprehension was assessed and found intact. Fund of Knowledge was assessed and was intact.  Cranial Nerves II - XII - II - Visual field intact OU. III, IV, VI - Extraocular movements showed mild right gaze difficulty with mild right CN VI palsy. Right beating nystagmus on right gaze V - Facial sensation intact bilaterally. VII - right lower facial droop VIII - Hearing & vestibular intact bilaterally, horizontal nystagmus on right side gaze, rotational nystagmus on upward and downward gaze. X - Palate elevates symmetrically. XI - Chin turning & shoulder shrug intact bilaterally. XII - Tongue protrusion intact.  Motor Strength - The patient's strength was normal in all extremities and pronator drift was absent.  Bulk was normal and fasciculations were absent.   Motor Tone - Muscle tone was assessed at the neck and appendages and was normal.  Reflexes - The patient's reflexes were 1+ in all extremities and she had no pathological reflexes.  Sensory - Light touch, temperature/pinprick were assessed and were symmetrical.     Coordination - The patient had ataxia on the right FTN and HTS, intact on the left.  Tremor was absent.  Gait and Station - not tested due to safety concerns.   ASSESSMENT/PLAN [name] is a 80 y.o. female with history of COPD, breast cancer on remission, bladder cancer s/p chemotherapy and surgery, paroxysmal A. fib on aspirin and the CHF presenting with ataxia, vertigo and nausea vomiting. CT showed right cerebellar ICH. She received right cerebellar hematoma evacuation.     Right cerebellar ICH:  S/p hematoma evacuation. Etiology not clear, HTN, vs. CAA vs. AVM vs. Brain mets. Patient blood pressure well controlled at home and after admission, and malignancy also in remission. Will need follow-up MRI,    Resultant  diplopia, nystagmus, ataxia. Overall neuro staus improved.  CT showed right cerebellar hemorrhage  Repeat CT showed mild hydrocephalus but stable  2D Echo unremarkable  LDL 101  HgbA1c 6.2  Heparin subcutaneous for VTE prophylaxis Diet regular Room service appropriate?: Yes; Fluid consistency:: Thin  aspirin 81 mg orally every day prior to admission, now on no antithrombotic  Ongoing aggressive stroke risk factor management  Therapy recommendations:  CIR  Disposition:  Pending  Cerebellar edema, induced hypernatremia   Due to cerebellar hematoma  Put on 3% saline for cerebral edema  Goal Na 150-160  Na Q6  Sodium today 147->150   Close watch for hydrocephalus  Paroxysmal Afib with RVR  Cardiology on board  On metoprolol and cardizem  Rate still uncontrolled  On aspirin 81 at home  Patient is not anticoagulation candidate due to spontaneous cerebellar hemorrhage.   Hypertension  BP stable 110-150  Pt denies hx of HTN  Lisinopril switched to metoprolol  Currently metoprolol 8m bid  Hyperlipidemia  LDL 101, goal < 70  Add Lipitor 10  Continue statin at discharge  Other Stroke Risk Factors  Advanced  age  Former smoker for 40 years  Breast cancer in remission  Bladder cancer were treated last December  CHF  Other Active Problems  Leukocytosis  Hospital day # 9  Plan : . Long discussion with the patient, husband and daughter regarding her prognosis, risk for recurrent strokes and answered questions patient is likely to be transferred to inpatient rehabilitation today if cardiology regularly. Stroke service recommends repeating CT scan of the head in one week and if blood is isodense start aspirin 325 mg daily. Follow-up as an outpatient in stroke clinic in 2 months.   Antony Contras, MD  Stroke Neurology 02/14/2015 1:14 PM   To contact Stroke Continuity provider, please refer to [email or web address] After hours, contact General Neurology

## 2015-02-14 NOTE — Progress Notes (Signed)
Retta Diones, RN Rehab Admission Coordinator Signed Physical Medicine and Rehabilitation PMR Pre-admission 02/11/2015 11:46 AM  Related encounter: ED to Hosp-Admission (Current) from 02/05/2015 in [place] [facility]    Expand All Collapse All   PMR Admission Coordinator Pre-Admission Assessment  Patient: [name] is an 80 y.o., female MRN: [id number] DOB: 03-02-1935 Height: 5' 6" (167.6 cm) Weight: 74.2 kg (163 lb 9.3 oz)  Insurance Information HMO: No PPO: PCP: IPA: 80/20: OTHER:  PRIMARY: Medicare A/B Policy#: [id number] Subscriber: [name] CM Name: Phone#: Fax#:  Pre-Cert#: Employer: Retired Benefits: Phone #: Name: Checked in [place]. Date: 06/03/02 Deduct: $1288 Out of Pocket Max: none Life Max: unlimited CIR: 100% SNF: 100 days Outpatient: 80% Co-Pay: 20% Home Health: 100% Co-Pay: none DME: 80% Co-Pay: 20% Providers: patient's choice  SECONDARY: BCBS supplement Policy#: [id number] Subscriber: [name] CM Name: Phone#: Fax#:  Pre-Cert#: Employer: Retired Benefits: Phone #: [phone] Name:  Eff. Date: Deduct: Out of Pocket Max: Life Max:  CIR: SNF:  Outpatient: Co-Pay:  Home Health: Co-Pay:  DME: Co-Pay:   Emergency Contact Information Contact Information    Name Relation Home Work [place] Spouse [phone]  [phone]   [name] Daughter   [phone]     Current Medical History  Patient Admitting Diagnosis: R cerebellar hemorrhage  History of Present Illness: A 80 y.o. right handed female with history of hypertension, transient  atrial fibrillation maintained on aspirin, diastolic congestive heart failure, COPD, recurrent bladder papillary carcinoma as well as breast cancer with mastectomy. Patient lives with husband and independent prior to admission. Presented 02/05/2015 with dizziness and nausea/vomiting as well as bouts of blurred vision. Denies any recent fall or head trauma. CT of the head showed a 4 cm acute hemorrhage within the right cerebellar hemisphere with surrounding edema and mass effect on the fourth ventricle as well as lacunar infarct of the right caudate head appeared to be remote. Underwent right retrosigmoid suboccipital craniotomy, evacuation of hematoma 02/05/2015 per Dr. Kathyrn Sheriff. Hospital course blood pressure management. Subcutaneous heparin was later initiated for DVT prophylaxis 02/06/2015. Follow-up cranial CT scans shows that right cerebellar hematoma remains spanning over a 4.1 x 3.5 cm versus 4.4 x 3.5 cm as well as surrounding vasogenic edema mildly progressed. A follow-up cranial CT scan for 02/08/2015 with a small amount of mild hydrocephalus advised monitoring. Placed on 3% saline to decrease any cerebral edema. 3 % saline discontinued 02/13/15. Cardiology consulted 02/08/2015 for a noted history of transient atrial fibrillation had been on aspirin prior to admission patient with bouts of A. fib RVR and was placed on Cardizem with rate controlled. She was not a candidate for systemic anticoagulation given her hemorrhage. Echocardiogram with ejection fraction 02% grade 1 diastolic dysfunction. Patient is on a regular consistency diet physical and occupational therapy evaluations completed and ongoing with recommendations of physical medicine rehabilitation consult. Patient to be admitted for comprehensive inpatient rehabilitation program.   Total: 9=NIH  Past Medical History  Past Medical History  Diagnosis Date  . History of CHF (congestive heart failure)     POST SURG 2010  . COPD  (chronic obstructive pulmonary disease)   . 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 Glaucoma of both eyes   . History of atrial fibrillation without current medication 2010    POST SURG 2010-- PER PT NO ISSUES SINCE  . Arthritis   .  Dyspnea on exertion   . GERD (gastroesophageal reflux disease)   . Nephrolithiasis   . History of shingles     02/ 2015-- back of neck and left flank-- no residual pain  . History of hypertension   . Recurrent bladder papillary carcinoma first dx 06/ 2014    s/p turbt's and instillation mitomycin c (chemo)    Family History  family history includes Breast cancer (age of onset: 52) in her maternal aunt; Breast cancer (age of onset: 96) in her daughter; Breast cancer (age of onset: 71) in her other; Cancer in her maternal aunt; Colon cancer in her maternal grandmother; Colon cancer (age of onset: 28) in her mother.  Prior Rehab/Hospitalizations: No previous rehab admissions.  Has the patient had major surgery during 100 days prior to admission? Yes. Patient had outpatient surgery for bladder cancer in December 2015.  Current Medications   Current facility-administered medications:  . acetaminophen (TYLENOL) tablet 650 mg, 650 mg, Oral, Q4H PRN, 650 mg at 02/12/15 2101 **OR** acetaminophen (TYLENOL) suppository 650 mg, 650 mg, Rectal, Q4H PRN, Wallie Char . antiseptic oral rinse (CPC / CETYLPYRIDINIUM CHLORIDE 0.05%) solution 7 mL, 7 mL, Mouth Rinse, BID, Consuella Lose, MD, 7 mL at 02/13/15 1000 . atorvastatin (LIPITOR) tablet 10 mg, 10 mg, Oral, q1800, Rosalin Hawking, MD, 10 mg at 02/13/15 1752 . bethanechol (URECHOLINE) tablet 25 mg, 25 mg, Oral, TID, Consuella Lose, MD, 25 mg at 02/14/15 0956 . bisacodyl (DULCOLAX) suppository 10 mg, 10 mg, Rectal, Daily PRN, Consuella Lose, MD . diltiazem (CARDIZEM) tablet 60 mg, 60 mg,  Oral, QID, Jerline Pain, MD, 60 mg at 02/14/15 0956 . docusate sodium (COLACE) capsule 100 mg, 100 mg, Oral, BID, Consuella Lose, MD, 100 mg at 02/11/15 2213 . heparin injection 5,000 Units, 5,000 Units, Subcutaneous, 3 times per day, Consuella Lose, MD, 5,000 Units at 02/14/15 0626 . labetalol (NORMODYNE,TRANDATE) injection 10-40 mg, 10-40 mg, Intravenous, Q10 min PRN, Wallie Char, 10 mg at 02/12/15 0924 . latanoprost (XALATAN) 0.005 % ophthalmic solution 1 drop, 1 drop, Both Eyes, QHS, Consuella Lose, MD, 1 drop at 02/13/15 2131 . levalbuterol (XOPENEX) nebulizer solution 0.63 mg, 0.63 mg, Nebulization, Q4H PRN, Consuella Lose, MD . metoprolol (LOPRESSOR) tablet 50 mg, 50 mg, Oral, TID, Josue Hector, MD, 50 mg at 02/14/15 0953 . ondansetron (ZOFRAN) tablet 4 mg, 4 mg, Oral, Q4H PRN **OR** ondansetron (ZOFRAN) injection 4 mg, 4 mg, Intravenous, Q4H PRN, Consuella Lose, MD, 4 mg at 02/06/15 0151 . pantoprazole (PROTONIX) EC tablet 40 mg, 40 mg, Oral, Daily, Consuella Lose, MD, 40 mg at 02/14/15 0956 . promethazine (PHENERGAN) tablet 12.5-25 mg, 12.5-25 mg, Oral, Q4H PRN, Consuella Lose, MD . senna (SENOKOT) tablet 8.6 mg, 1 tablet, Oral, BID, Consuella Lose, MD, 8.6 mg at 02/11/15 2214 . sodium chloride (hypertonic) 3 % solution, , Intravenous, Continuous, Amie Portland, MD, Stopped at 02/13/15 [phone]  Patients Current Diet: Diet regular Room service appropriate?: Yes; Fluid consistency:: Thin  Precautions / Restrictions Precautions Precautions: Fall Restrictions Weight Bearing Restrictions: No   Has the patient had 2 or more falls or a fall with injury in the past year?No  Prior Activity Level Community (5-7x/wk): Went out daily. Ran errands.  Home Assistive Devices / Equipment Home Assistive Devices/Equipment: Cane (specify quad or straight) Home Equipment: Walker - 2 wheels, Bedside commode, Shower seat - built in  Prior Device Use:  Indicate devices/aids used by the patient prior to current illness, exacerbation or injury? None of the above. For  safety, used a cane at night to assist going to the bathroom. Did not use a cane or other device during the day.  Prior Functional Level Prior Function Level of Independence: Independent Comments: drove, ran errands  Self Care: Did the patient need help bathing, dressing, using the toilet or eating? Independent  Indoor Mobility: Did the patient need assistance with walking from room to room (with or without device)? Independent  Stairs: Did the patient need assistance with internal or external stairs (with or without device)? Independent  Functional Cognition: Did the patient need help planning regular tasks such as shopping or remembering to take medications? Independent  Current Functional Level Cognition  Arousal/Alertness: Awake/alert Overall Cognitive Status: Within Functional Limits for tasks assessed Current Attention Level: Selective Orientation Level: Oriented X4 Following Commands: Follows one step commands consistently Safety/Judgement: Decreased awareness of deficits Attention: Sustained Sustained Attention: Appears intact Memory: Appears intact Awareness: Appears intact Problem Solving: Appears intact Executive Function: Decision Making Decision Making: Appears intact Safety/Judgment: Appears intact   Extremity Assessment (includes Sensation/Coordination)  Upper Extremity Assessment: RUE deficits/detail, LUE deficits/detail RUE Deficits / Details: Ataxic. Full AROM  RUE Sensation: decreased proprioception RUE Coordination: decreased fine motor, decreased gross motor LUE Deficits / Details: dysmetria  LUE Sensation: decreased proprioception LUE Coordination: decreased gross motor, decreased fine motor  Lower Extremity Assessment: Defer to PT evaluation RLE Deficits / Details: grossly >3+/5, ataxic and uncontrolled movement RLE  Coordination: decreased fine motor LLE Deficits / Details: generally WFL    ADLs  Overall ADL's : Needs assistance/impaired Eating/Feeding: Minimal assistance, Bed level, Sitting Grooming: Wash/dry hands, Wash/dry face, Oral care, Brushing hair, Minimal assistance, Sitting Upper Body Bathing: Moderate assistance, Sitting Lower Body Bathing: Maximal assistance, Sit to/from stand Upper Body Dressing : Moderate assistance, Sitting Lower Body Dressing: Total assistance, Sit to/from stand Lower Body Dressing Details (indicate cue type and reason): Pt unable to access bil. feet due to ataxia and dizziness Toilet Transfer: Moderate assistance, +2 for physical assistance, Ambulation, BSC Toilet Transfer Details (indicate cue type and reason): see comments below  Toileting- Clothing Manipulation and Hygiene: Moderate assistance, Sit to/from stand Functional mobility during ADLs: Moderate assistance, +2 for physical assistance    Mobility  Overal bed mobility: Needs Assistance Bed Mobility: Sidelying to Sit Rolling: Min assist Sidelying to sit: Min assist Sit to supine: Min guard General bed mobility comments: pt impulsive and needed help to keep from falling face first into the floor.    Transfers  Overall transfer level: Needs assistance Equipment used: 2 person hand held assist Transfers: Sit to/from Stand, Stand Pivot Transfers Sit to Stand: Mod assist, +2 physical assistance Stand pivot transfers: Mod assist, +2 physical assistance General transfer comment: Still needing significant truncal assist, but pt able to use to support better to [facility] more functionally/fluidly.    Ambulation / Gait / Stairs / Wheelchair Mobility  Ambulation/Gait General Gait Details: not tested    Posture / Balance Dynamic Sitting Balance Sitting balance - Comments: no assist needed. worked on [employer] Overall balance assessment: Needs assistance Sitting-balance support: No upper  extremity supported Sitting balance-Leahy Scale: Fair Sitting balance - Comments: no assist needed. worked on [employer] support: Bilateral upper extremity supported Standing balance-Leahy Scale: Poor Standing balance comment: Worked > 15 min at [employer] on balance and controlled movement in upright and crouched stance. with 2 person graded support. pt able to utilize the support to move more smoothly, especially stepping with either LE.     Special needs/care  consideration BiPAP/CPAP No CPM No Continuous Drip IV No Dialysis No  Life Vest No Oxygen No Special Bed No Trach Size No Wound Vac (area) No  Skin Bruising on forearms  Bowel mgmt: Last BM 02/09/15 Bladder mgmt: Catheter out now and up on BSC Diabetic mgmt No    Previous Home Environment Living Arrangements: Spouse/significant other (and a son who has the more flexible schedule) Lives With: Spouse Available Help at Discharge: Family Type of Home: House Home Layout: Two level, Able to live on main level with bedroom/bathroom Alternate Level Stairs-Rails: Right, Left Alternate Level Stairs-Number of Steps: flight Home Access: Stairs to enter Entrance Stairs-Rails: None Entrance Stairs-Number of Steps: 2 Bathroom Shower/Tub: Multimedia programmer: Handicapped height Bathroom Accessibility: Yes How Accessible: Accessible via walker Home Care Services: No  Discharge Living Setting Plans for Discharge Living Setting: Patient's home, House, Lives with (comment) (Lives with husband and with son, 43 yo.) Type of Home at Discharge: House Discharge Home Layout: Two level, Able to live on main level with bedroom/bathroom Alternate Level Stairs-Number of Steps: Flight Discharge Home Access: Stairs to enter Technical brewer of Steps: 2 Does the patient have any problems obtaining your medications?: No  Social/Family/Support Systems Patient Roles: Spouse,  Parent (Has a husband, son and daughters.) Contact Information: Kemari Mares - spouse [phone] Anticipated Caregiver: son and daughters Ability/Limitations of Caregiver: Son Radiation protection practitioner and can assist. Dtr out for summer. Another daughter can also assist. Husband works FT. Caregiver Availability: 24/7 Discharge Plan Discussed with Primary Caregiver: Yes Is Caregiver In Agreement with Plan?: Yes Does Caregiver/Family have Issues with Lodging/Transportation while Pt is in Rehab?: No  Goals/Additional Needs Patient/Family Goal for Rehab: PT/OT min assist, ST supervision goals Expected length of stay: 14-18 days Cultural Considerations: None Dietary Needs: Regular diet, thin liquids Equipment Needs: TBD Pt/Family Agrees to Admission and willing to participate: Yes Program Orientation Provided & Reviewed with Pt/Caregiver Including Roles & Responsibilities: Yes  Decrease burden of Care through IP rehab admission: N/A  Possible need for SNF placement upon discharge: Not anticipated  Patient Condition: This patient's medical and functional status has changed since the consult dated: 02/09/15 in which the Rehabilitation Physician determined and documented that the patient's condition is appropriate for intensive rehabilitative care in an inpatient rehabilitation facility. See "History of Present Illness" (above) for medical update. Functional changes are: Currently requiring mod assist for stand pivot transfers. Patient's medical and functional status update has been discussed with the Rehabilitation physician and patient remains appropriate for inpatient rehabilitation. Will admit to inpatient rehab today.  Preadmission Screen Completed By: Retta Diones, 02/14/2015 11:29 AM ______________________________________________________________________  Discussed status with Dr. Naaman Plummer on 02/14/15 at 1115 and received telephone approval for admission today.  Admission Coordinator:  Retta Diones, time1130/Date06/13/16          Cosigned by: Meredith Staggers, MD at 02/14/2015 1:49 PM  Revision History     Date/Time User Provider Type Action   02/14/2015 1:49 PM Meredith Staggers, MD Physician Cosign   02/14/2015 11:30 AM Retta Diones, RN Rehab Admission Coordinator Sign

## 2015-02-14 NOTE — Progress Notes (Signed)
Pt admitted to Rehab. Alert and orientated x 4. Diagnostic specific information provided. Discussed rehab expectations and therapy sessions/schedule. All questions answered. Pt ready to start therapy in the a.m.

## 2015-02-14 NOTE — H&P (Signed)
Expand All Collapse All      Physical Medicine and Rehabilitation Admission H&P   Chief Complaint  Patient presents with  . Dizziness  . Nausea  : HPI: [name] is a 80 y.o. right handed female with history of hypertension, transient atrial fibrillation maintained on aspirin, diastolic congestive heart failure, COPD, recurrent bladder papillary carcinoma as well as breast cancer with mastectomy. Patient lives with husband and independent prior to admission. Presented 02/05/2015 with dizziness and nausea/vomiting as well as bouts of blurred vision. Denies any recent fall or head trauma. CT of the head showed a 4 cm acute hemorrhage within the right cerebellar hemisphere with surrounding edema and mass effect on the fourth ventricle as well as lacunar infarct of the right caudate head appeared to be remote. Underwent right retrosigmoid suboccipital craniotomy, evacuation of hematoma 02/05/2015 per Dr. Kathyrn Sheriff. Hospital course blood pressure management. Subcutaneous heparin was later initiated for DVT prophylaxis 02/06/2015. Follow-up cranial CT scans shows that right cerebellar hematoma remains spanning over a 4.1 x 3.5 cm versus 4.4 x 3.5 cm as well as surrounding vasogenic edema mildly progressed. A follow-up cranial CT scan for 02/08/2015 with a small amount of mild hydrocephalus advised monitoring and unchanged with repeat scan 02/13/2015. Placed on 3% saline to decrease any cerebral edema and later discontinued.. Cardiology consulted 02/08/2015 for a noted history of transient atrial fibrillation had been on aspirin prior to admission patient with bouts of A. fib RVR and was placed on Cardizem with rate controlled. She was not a candidate for systemic anticoagulation given her hemorrhage. Echocardiogram with ejection fraction 23% grade 1 diastolic dysfunction. Patient is on a regular consistency diet physical and occupational therapy evaluations completed and ongoing with  recommendations of physical medicine rehabilitation consult. Patient was admitted for comprehensive rehabilitation program  ROS Review of Systems  Constitutional: Negative for fever and chills.  Eyes: Positive for blurred vision.  Respiratory: Negative for cough.   Dyspnea on exertion  Cardiovascular: Positive for palpitations.  Gastrointestinal: Positive for nausea, vomiting and constipation.   GERD  Genitourinary: Negative for dysuria and frequency.  Musculoskeletal: Positive for myalgias and joint pain.  Neurological: Positive for dizziness. Negative for headaches    Past Medical History  Diagnosis Date  . History of CHF (congestive heart failure)     POST SURG 2010  . COPD (chronic obstructive pulmonary disease)   . 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 Glaucoma of both eyes   . History of atrial fibrillation without current medication 2010    POST SURG 2010-- PER PT NO ISSUES SINCE  . Arthritis   . Dyspnea on exertion   . GERD (gastroesophageal reflux disease)   . Nephrolithiasis   . History of shingles     02/ 2015-- back of neck and left flank-- no residual pain  . History of hypertension   . Recurrent bladder papillary carcinoma first dx 06/ 2014    s/p turbt's and instillation mitomycin c (chemo)   Past Surgical History  Procedure Laterality Date  . Total knee arthroplasty Right 03-22-2009  . Partial mastectomy with needle localization Right 01-14-2003    DCIS  . Total mastectomy Right 02-03-2003    W/ SLN BX AND POST 03-01-2003 EVACUATION HEMATOMA  . Removal cyst left hand  2013  . Transthoracic echocardiogram  03-25-2009    MILD LVF/ EF 70-80%/ MILD INCREASE SYSTOLIC PULMONARY PRESSURE  . Transurethral  resection of bladder tumor with gyrus (turbt-gyrus) N/A 03/26/2013     Procedure: TRANSURETHRAL RESECTION OF BLADDER TUMOR WITH GYRUS (TURBT-GYRUS); Surgeon: Alexis Frock, MD; Location: [facility]; Service: Urology; Laterality: N/A;  . Cystoscopy w/ ureteral stent placement Bilateral 03/26/2013    Procedure: CYSTOSCOPY WITH BILATERAL RETROGRADE PYELOGRAM/URETERAL STENT PLACEMENT; Surgeon: Alexis Frock, MD; Location: [facility]; Service: Urology; Laterality: Bilateral;  . Transurethral resection of bladder tumor with gyrus (turbt-gyrus) N/A 05/13/2013    Procedure: TRANSURETHRAL RESECTION OF BLADDER TUMOR WITH GYRUS (TURBT-GYRUS) RE-STAGING TRANSURETHRAL RESECTION OF BLADDER TUMOR, LEFT RETROGRADE PYELOGRAM AND STENT EXCHANGE; Surgeon: Alexis Frock, MD; Location: [facility]; Service: Urology; Laterality: N/A;  . Cystoscopy w/ ureteral stent placement Left 05/13/2013    Procedure: CYSTOSCOPY WITH RETROGRADE PYELOGRAM/URETERAL STENT PLACEMENT STENT EXCHANGE; Surgeon: Alexis Frock, MD; Location: [facility]; Service: Urology; Laterality: Left;  . Tubal ligation    . Cataract extraction w/ intraocular lens implant, bilateral    . Transurethral resection of bladder tumor with gyrus (turbt-gyrus) N/A 08/11/2014    Procedure: TRANSURETHRAL RESECTION OF BLADDER TUMOR WITH GYRUS (TURBT-GYRUS); Surgeon: Alexis Frock, MD; Location: [facility]; Service: Urology; Laterality: N/A;  . Cystoscopy w/ retrogrades Bilateral 08/11/2014    Procedure: CYSTOSCOPY WITH BILATERAL RETROGRADE PYELOGRAM AND MITOMYCIN INSTILLATION; Surgeon: Alexis Frock, MD; Location: [facility]; Service: Urology; Laterality: Bilateral;  . Craniectomy N/A 02/05/2015    Procedure: SUBOCCIPITAL CRANIECTOMY EVACUATION OF HEMATOMA; Surgeon: Consuella Lose, MD; Location: [facility]; Service: Neurosurgery; Laterality: N/A;   Family  History  Problem Relation Age of Onset  . Colon cancer Mother 75  . Colon cancer Maternal Grandmother   . Breast cancer Maternal Aunt 64  . Cancer Maternal Aunt     ? ovarian  . Breast cancer Other 60    breast cancer, ovarian cancer  . Breast cancer Daughter 45   Social History:  reports that she quit smoking about 11 years ago. Her smoking use included Cigarettes. She has a 40 pack-year smoking history. She has never used smokeless tobacco. She reports that she does not drink alcohol or use illicit drugs. Allergies:  Allergies  Allergen Reactions  . Morphine And Related Nausea And Vomiting   Medications Prior to Admission  Medication Sig Dispense Refill  . aspirin 81 MG tablet Take 81 mg by mouth daily.    Marland Kitchen latanoprost (XALATAN) 0.005 % ophthalmic solution Place 1 drop into both eyes at bedtime.    . levalbuterol (XOPENEX HFA) 45 MCG/ACT inhaler Inhale 1-2 puffs into the lungs every 4 (four) hours as needed for wheezing.    . pantoprazole (PROTONIX) 40 MG tablet Take 40 mg by mouth daily.  3  . SIMBRINZA 1-0.2 % SUSP Place 1 drop into both eyes 2 (two) times daily.   99  . amoxicillin (AMOXIL) 500 MG capsule Prior to dentist appointment      Home: Home Living Family/patient expects to be discharged to:: Private residence Living Arrangements: Spouse/significant other (and a son who has the more flexible schedule) Available Help at Discharge: Family Type of Home: House Home Access: Stairs to enter Technical brewer of Steps: 2 Entrance Stairs-Rails: None Home Layout: Two level, Able to live on main level with bedroom/bathroom Alternate Level Stairs-Number of Steps: flight Alternate Level Stairs-Rails: Right, Left Home Equipment: Walker - 2 wheels, Bedside commode, Shower seat - built in Lives With: Spouse  Functional History: Prior Function Level of Independence: Independent Comments:  drove, ran errands  Functional  Status:  Mobility: Bed Mobility Overal bed mobility: Needs Assistance Bed Mobility: Rolling, Sidelying to Sit, Sit to Supine Rolling: Min guard Sidelying to sit: Min assist Sit to supine: Min guard General bed mobility comments: pt unable to coordinate RUE to assist trunk coming up from R to L. pt need truncal control to stabilize for scoot to EOB Transfers Overall transfer level: Needs assistance Equipment used: 2 person hand held assist Transfers: Sit to/from Stand Sit to Stand: Mod assist, +2 physical assistance General transfer comment: 2 person assist for safety due to significant truncal ataxia Ambulation/Gait General Gait Details: not tested    ADL: ADL Overall ADL's : Needs assistance/impaired Eating/Feeding: Minimal assistance, Bed level, Sitting Grooming: Wash/dry hands, Wash/dry face, Oral care, Brushing hair, Minimal assistance, Sitting Upper Body Bathing: Moderate assistance, Sitting Lower Body Bathing: Maximal assistance, Sit to/from stand Upper Body Dressing : Moderate assistance, Sitting Lower Body Dressing: Total assistance, Sit to/from stand Lower Body Dressing Details (indicate cue type and reason): Pt unable to access bil. feet due to ataxia and dizziness Toilet Transfer: Total assistance Toilet Transfer Details (indicate cue type and reason): unable to perform safely this date  Toileting- Water quality scientist and Hygiene: Total assistance, Sit to/from stand Functional mobility during ADLs: Moderate assistance, +2 for physical assistance  Cognition: Cognition Overall Cognitive Status: Within Functional Limits for tasks assessed Arousal/Alertness: Awake/alert Orientation Level: Oriented X4 Attention: Sustained Sustained Attention: Appears intact Memory: Appears intact Awareness: Appears intact Problem Solving: Appears intact Executive Function: Decision Making Decision Making: Appears intact Safety/Judgment: Appears  intact Cognition Arousal/Alertness: Awake/alert Behavior During Therapy: Flat affect Overall Cognitive Status: Within Functional Limits for tasks assessed Area of Impairment: Safety/judgement, Awareness Current Attention Level: Sustained Safety/Judgement: Decreased awareness of safety, Decreased awareness of deficits Awareness: Intellectual  Physical Exam: Blood pressure 150/88, pulse 103, temperature 98.2 F (36.8 C), temperature source Oral, resp. rate 24, height _0  (1.676 m), weight 74.2 kg (163 lb 9.3 oz), last menstrual period 12/03/2002, SpO2 99 %. Physical Exam Constitutional: She appears well-developed.  HENT:  Craniotomy site clean and dry  Eyes:  Pupils ERRL.  Neck: Normal range of motion. Neck supple. No thyromegaly present.  Cardiovascular: Cardiac rate controlled. No murmur or rubs,  Respiratory: Effort normal and breath sounds normal. No respiratory distress. No wheezes, rales, rubs GI: Soft. Bowel sounds are normal. She exhibits no distension.  Neurological: She is alert.  Mood is flat but appropriate. She is able to provide her name, age date of birth in place. Fair awareness of deficits. Follows simple commands  Severe dysmetria right finger-nose-finger, and dysmetria right heel to shin, minimal dysmetria on left finger-nose-finger, and no evidence of dysmetria left heel-to-shin Motor strength is 4/5 bilateral deltoid, biceps, triceps, grip, hip flexor, knee extensor, ankle dorsal flexor plantar flexor. No sensory deficits.  Psych: pt is pleasant and appropriate     Lab Results Last 48 Hours    Results for orders placed or performed during the hospital encounter of 02/05/15 (from the past 48 hour(s))  Sodium Status: None   Collection Time: 02/08/15 9:05 AM  Result Value Ref Range   Sodium 144 135 - 145 mmol/L  Troponin I (q 6hr x 3) Status: None   Collection Time: 02/08/15 9:05 AM  Result Value Ref Range   Troponin I  <0.03 <0.031 ng/mL    Comment:   NO INDICATION OF MYOCARDIAL INJURY.   Sodium Status: None   Collection Time: 02/08/15 2:55 PM  Result Value Ref Range   Sodium  144 135 - 145 mmol/L  Sodium Status: Abnormal   Collection Time: 02/08/15 9:37 PM  Result Value Ref Range   Sodium 146 (H) 135 - 145 mmol/L  Sodium Status: None   Collection Time: 02/09/15 2:48 AM  Result Value Ref Range   Sodium 144 135 - 145 mmol/L  Sodium Status: Abnormal   Collection Time: 02/09/15 9:00 AM  Result Value Ref Range   Sodium 146 (H) 135 - 145 mmol/L  Sodium Status: None   Collection Time: 02/09/15 4:30 PM  Result Value Ref Range   Sodium 145 135 - 145 mmol/L  Sodium Status: None   Collection Time: 02/09/15 8:40 PM  Result Value Ref Range   Sodium 145 135 - 145 mmol/L  Sodium Status: Abnormal   Collection Time: 02/10/15 2:45 AM  Result Value Ref Range   Sodium 147 (H) 135 - 145 mmol/L      Imaging Results (Last 48 hours)    No results found.       Medical Problem List and Plan: 1. Functional deficits secondary to right cerebellar hemorrhage status post craniotomy evacuation of hematoma 02/05/2015 2. DVT Prophylaxis/Anticoagulation: Subcutaneous heparin initiated 02/06/2015. Monitor for any bleeding episodes 3. Pain Management: Tylenol as needed 4. A. fib with RVR. Continue Cardizem 60 mg 4 times a day, Lopressor 50 mg 3 times a day. Cardiac rate controlled. 5. Neuropsych: This patient is capable of making decisions on her own behalf. 6. Skin/Wound Care: Routine skin checks 7. Fluids/Electrolytes/Nutrition: Routine I nose with follow-up chemistries 8. Diastolic congestive heart failure. No signs of fluid overload. Follow-up per cardiology services 9. COPD. Nebulizers as directed. No shortness of breath reported 10. Recurrent bladder papillary carcinoma as well as history of  breast cancer with mastectomy. 11. Hyperlipidemia. Lipitor 12. Constipation. Laxed assistance.  Post Admission Physician Evaluation: 1. Functional deficits secondary to right cerebellar hemorrhage. 2. Patient is admitted to receive collaborative, interdisciplinary care between the physiatrist, rehab nursing staff, and therapy team. 3. Patient's level of medical complexity and substantial therapy needs in context of that medical necessity cannot be provided at a lesser intensity of care such as a SNF. 4. Patient has experienced substantial functional loss from his/her baseline which was documented above under the "Functional History" and "Functional Status" headings. Judging by the patient's diagnosis, physical exam, and functional history, the patient has potential for functional progress which will result in measurable gains while on inpatient rehab. These gains will be of substantial and practical use upon discharge in facilitating mobility and self-care at the household level. 5. Physiatrist will provide 24 hour management of medical needs as well as oversight of the therapy plan/treatment and provide guidance as appropriate regarding the interaction of the two. 6. 24 hour rehab nursing will assist with bladder management, bowel management, safety, skin/wound care, disease management, medication administration, pain management and patient education and help integrate therapy concepts, techniques,education, etc. 7. PT will assess and treat for/with: Lower extremity strength, range of motion, stamina, balance, functional mobility, safety, adaptive techniques and equipment, NMR, vestibular rx, safety, ego support. Goals are: supervision. 8. OT will assess and treat for/with: ADL's, functional mobility, safety, upper extremity strength, adaptive techniques and equipment, NMR, cognitive perceptual awareness, ego support,, . Goals are: supervision. Therapy may proceed with showering this  patient. 9. SLP will assess and treat for/with: cognition, communication. Goals are: supervision. 10. Case Management and Social Worker will assess and treat for psychological issues and discharge planning. 11. Team conference will be held weekly to  assess progress toward goals and to determine barriers to discharge. 12. Patient will receive at least 3 hours of therapy per day at least 5 days per week. 13. ELOS: 13-15 days  14. Prognosis: excellent     Meredith Staggers, MD, [place] [facility] 02/14/2015

## 2015-02-14 NOTE — Progress Notes (Signed)
Susan Blake, MD Physician Signed Physical Medicine and Rehabilitation Consult Note 02/08/2015 5:42 AM  Related encounter: ED to Hosp-Admission (Current) from 02/05/2015 in [facility]    Expand All Collapse All        Physical Medicine and Rehabilitation Consult Reason for Consult: Large right hemispheric cerebellar bleed Referring Physician: Dr. Kathyrn Sheriff   HPI: [name] is a 80 y.o. right handed female with history of hypertension, transient atrial fibrillation maintained on aspirin, diastolic congestive heart failure, COPD, recurrent bladder papillary carcinoma as well as breast cancer with mastectomy. Patient lives with husband and independent prior to admission. Presented 02/05/2015 with dizziness and nausea/vomiting as well as bouts of blurred vision. Denies any recent fall or head trauma. CT of the head showed a 4 cm acute hemorrhage within the right cerebellar hemisphere with surrounding edema and mass effect on the fourth ventricle as well as lacunar infarct of the right caudate head appeared to be remote. Underwent right retrosigmoid suboccipital craniotomy, evacuation of hematoma 02/05/2015 per Dr. Kathyrn Sheriff. Hospital course blood pressure management. Subcutaneous heparin was later initiated for DVT prophylaxis 02/06/2015. Follow-up cranial CT scans shows that right cerebellar hematoma remains spanning over a 4.1 x 3.5 cm versus 4.4 x 3.5 cm as well as surrounding vasogenic edema mildly progressed. A follow-up cranial CT scan for 02/08/2015 is pending. Echocardiogram pending. Patient is on a regular consistency diet.  Patient is on 3% normal saline drip with increased rate today. Cardiology consult recommended Lopressor and no anticoagulation for atrial fibrillation with rapid ventricular response. Overall rates have improved now in the 80-110 rate No vomiting, no hiccups Review of Systems  Constitutional: Negative for fever and chills.   Eyes: Positive for blurred vision.  Respiratory: Negative for cough.   Dyspnea on exertion  Cardiovascular: Positive for palpitations.  Gastrointestinal: Positive for nausea, vomiting and constipation.   GERD  Genitourinary: Negative for dysuria and frequency.  Musculoskeletal: Positive for myalgias and joint pain.  Neurological: Positive for dizziness. Negative for headaches.   Past Medical History  Diagnosis Date  . History of CHF (congestive heart failure)     POST SURG 2010  . COPD (chronic obstructive pulmonary disease)   . 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 Glaucoma of both eyes   . History of atrial fibrillation without current medication 2010    POST SURG 2010-- PER PT NO ISSUES SINCE  . Arthritis   . Dyspnea on exertion   . GERD (gastroesophageal reflux disease)   . Nephrolithiasis   . History of shingles     02/ 2015-- back of neck and left flank-- no residual pain  . History of hypertension   . Recurrent bladder papillary carcinoma first dx 06/ 2014    s/p turbt's and instillation mitomycin c (chemo)   Past Surgical History  Procedure Laterality Date  . Total knee arthroplasty Right 03-22-2009  . Partial mastectomy with needle localization Right 01-14-2003    DCIS  . Total mastectomy Right 02-03-2003    W/ SLN BX AND POST 03-01-2003 EVACUATION HEMATOMA  . Removal cyst left hand  2013  . Transthoracic echocardiogram  03-25-2009    MILD LVF/ EF 70-80%/ MILD INCREASE SYSTOLIC PULMONARY PRESSURE  . Transurethral resection of bladder tumor with gyrus (turbt-gyrus) N/A 03/26/2013    Procedure: TRANSURETHRAL RESECTION OF BLADDER TUMOR WITH GYRUS (TURBT-GYRUS); Surgeon: Alexis Frock, MD; Location: [facility]; Service: Urology; Laterality: N/A;  . Cystoscopy w/  ureteral stent placement Bilateral 03/26/2013    Procedure: CYSTOSCOPY WITH BILATERAL RETROGRADE PYELOGRAM/URETERAL STENT PLACEMENT; Surgeon: Alexis Frock, MD; Location: [facility]; Service: Urology; Laterality: Bilateral;  . Transurethral resection of bladder tumor with gyrus (turbt-gyrus) N/A 05/13/2013    Procedure: TRANSURETHRAL RESECTION OF BLADDER TUMOR WITH GYRUS (TURBT-GYRUS) RE-STAGING TRANSURETHRAL RESECTION OF BLADDER TUMOR, LEFT RETROGRADE PYELOGRAM AND STENT EXCHANGE; Surgeon: Alexis Frock, MD; Location: [facility]; Service: Urology; Laterality: N/A;  . Cystoscopy w/ ureteral stent placement Left 05/13/2013    Procedure: CYSTOSCOPY WITH RETROGRADE PYELOGRAM/URETERAL STENT PLACEMENT STENT EXCHANGE; Surgeon: Alexis Frock, MD; Location: [facility]; Service: Urology; Laterality: Left;  . Tubal ligation    . Cataract extraction w/ intraocular lens implant, bilateral    . Transurethral resection of bladder tumor with gyrus (turbt-gyrus) N/A 08/11/2014    Procedure: TRANSURETHRAL RESECTION OF BLADDER TUMOR WITH GYRUS (TURBT-GYRUS); Surgeon: Alexis Frock, MD; Location: [facility]; Service: Urology; Laterality: N/A;  . Cystoscopy w/ retrogrades Bilateral 08/11/2014    Procedure: CYSTOSCOPY WITH BILATERAL RETROGRADE PYELOGRAM AND MITOMYCIN INSTILLATION; Surgeon: Alexis Frock, MD; Location: [facility]; Service: Urology; Laterality: Bilateral;  . Craniectomy N/A 02/05/2015    Procedure: SUBOCCIPITAL CRANIECTOMY EVACUATION OF HEMATOMA; Surgeon: Consuella Lose, MD; Location: [facility]; Service: Neurosurgery; Laterality: N/A;   Family History  Problem Relation Age of Onset  . Colon cancer Mother 60  . Colon cancer Maternal Grandmother   . Breast cancer Maternal Aunt 24  . Cancer Maternal Aunt     ? ovarian  .  Breast cancer Other 60    breast cancer, ovarian cancer  . Breast cancer Daughter 41   Social History:  reports that she quit smoking about 11 years ago. Her smoking use included Cigarettes. She has a 40 pack-year smoking history. She has never used smokeless tobacco. She reports that she does not drink alcohol or use illicit drugs. Allergies:  Allergies  Allergen Reactions  . Morphine And Related Nausea And Vomiting   Medications Prior to Admission  Medication Sig Dispense Refill  . aspirin 81 MG tablet Take 81 mg by mouth daily.    Marland Kitchen latanoprost (XALATAN) 0.005 % ophthalmic solution Place 1 drop into both eyes at bedtime.    . levalbuterol (XOPENEX HFA) 45 MCG/ACT inhaler Inhale 1-2 puffs into the lungs every 4 (four) hours as needed for wheezing.    . pantoprazole (PROTONIX) 40 MG tablet Take 40 mg by mouth daily.  3  . SIMBRINZA 1-0.2 % SUSP Place 1 drop into both eyes 2 (two) times daily.   99  . amoxicillin (AMOXIL) 500 MG capsule Prior to dentist appointment      Home: Home Living Family/patient expects to be discharged to:: Private residence Living Arrangements: Spouse/significant other (and a son who has the more flexible schedule) Available Help at Discharge: Family Type of Home: House Home Access: Stairs to enter Technical brewer of Steps: 2 Entrance Stairs-Rails: None Home Layout: Two level, Able to live on main level with bedroom/bathroom Alternate Level Stairs-Number of Steps: flight Alternate Level Stairs-Rails: Right, Left Home Equipment: Walker - 2 wheels, Bedside commode, Shower seat - built in Lives With: Spouse  Functional History: Prior Function Level of Independence: Independent Comments: drove, ran errands Functional Status:  Mobility: Bed Mobility Overal bed mobility: Needs Assistance Bed Mobility: Rolling, Sidelying to Sit, Sit to Supine Rolling: Min assist Sidelying to sit: Mod  assist Sit  to supine: Mod assist General bed mobility comments: pt unable to coordinate RUE to assist trunk coming up from R to L. pt need truncal control to stabilize for scoot to EOB Transfers Overall transfer level: Needs assistance Equipment used: 2 person hand held assist Transfers: Sit to/from Stand Sit to Stand: Mod assist, +2 physical assistance General transfer comment: 2 person assist for safety due to significant truncal ataxia Ambulation/Gait General Gait Details: not tested    ADL: ADL Overall ADL's : Needs assistance/impaired Eating/Feeding: Minimal assistance, Bed level, Sitting Grooming: Wash/dry hands, Wash/dry face, Oral care, Brushing hair, Minimal assistance, Sitting Upper Body Bathing: Moderate assistance, Sitting Lower Body Bathing: Maximal assistance, Sit to/from stand Upper Body Dressing : Moderate assistance, Sitting Lower Body Dressing: Total assistance, Sit to/from stand Lower Body Dressing Details (indicate cue type and reason): Pt unable to access bil. feet due to ataxia and dizziness Toilet Transfer: Total assistance Toilet Transfer Details (indicate cue type and reason): unable to perform safely this date  Toileting- Water quality scientist and Hygiene: Total assistance, Sit to/from stand Functional mobility during ADLs: Moderate assistance, +2 for physical assistance  Cognition: Cognition Overall Cognitive Status: Impaired/Different from baseline Arousal/Alertness: Awake/alert Orientation Level: Oriented X4 Attention: Sustained Sustained Attention: Appears intact Memory: Appears intact Awareness: Appears intact Problem Solving: Appears intact Executive Function: Decision Making Decision Making: Appears intact Safety/Judgment: Appears intact Cognition Arousal/Alertness: Awake/alert, Lethargic Behavior During Therapy: Flat affect Overall Cognitive Status: Impaired/Different from baseline Area of Impairment: Attention, Safety/judgement,  Awareness Current Attention Level: Sustained Safety/Judgement: Decreased awareness of deficits Awareness: Intellectual  Blood pressure 125/76, pulse 66, temperature 99.3 F (37.4 C), temperature source Oral, resp. rate 20, height 5' 6" (1.676 m), weight 74.2 kg (163 lb 9.3 oz), last menstrual period 12/03/2002, SpO2 96 %. Physical Exam  Constitutional: She appears well-developed.  HENT:  Craniotomy site clean and dry  Eyes:  Pupils sluggish but reactive to light without nystagmus  Neck: Normal range of motion. Neck supple. No thyromegaly present.  Cardiovascular: Normal rate and regular rhythm.  Respiratory: Effort normal and breath sounds normal. No respiratory distress.  GI: Soft. Bowel sounds are normal. She exhibits no distension.  Neurological: She is alert.  Mood is flat but appropriate. She is able to provide her name, age date of birth in place. Fair awareness of deficits. Follows simple commands  Severe dysmetria right finger-nose-finger, moderate dysmetria right heel to shin, minimal dysmetria on left finger-nose-finger, and no evidence of dysmetria left heel-to-shin Motor strength is 4/5 bilateral deltoid, biceps, triceps, grip, hip flexor, knee extensor, ankle dorsal flexor plantar flexor    Lab Results Last 24 Hours    Results for orders placed or performed during the hospital encounter of 02/05/15 (from the past 24 hour(s))  CBC Status: Abnormal   Collection Time: 02/07/15 5:50 AM  Result Value Ref Range   WBC 12.2 (H) 4.0 - 10.5 K/uL   RBC 3.69 (L) 3.87 - 5.11 MIL/uL   Hemoglobin 11.4 (L) 12.0 - 15.0 g/dL   HCT 34.7 (L) 36.0 - 46.0 %   MCV 94.0 78.0 - 100.0 fL   MCH 30.9 26.0 - 34.0 pg   MCHC 32.9 30.0 - 36.0 g/dL   RDW 13.7 11.5 - 15.5 %   Platelets 183 150 - 400 K/uL  Basic metabolic panel Status: Abnormal   Collection Time: 02/07/15 5:50 AM  Result Value Ref Range   Sodium 137 135 - 145 mmol/L    Potassium 4.0 3.5 - 5.1 mmol/L   Chloride 99 (  L) 101 - 111 mmol/L   CO2 29 22 - 32 mmol/L   Glucose, Bld 115 (H) 65 - 99 mg/dL   BUN 10 6 - 20 mg/dL   Creatinine, Ser 0.53 0.44 - 1.00 mg/dL   Calcium 8.4 (L) 8.9 - 10.3 mg/dL   GFR calc non Af Amer >60 >60 mL/min   GFR calc Af Amer >60 >60 mL/min   Anion gap 9 5 - 15  Sodium Status: None   Collection Time: 02/07/15 9:50 AM  Result Value Ref Range   Sodium 138 135 - 145 mmol/L  Sodium Status: None   Collection Time: 02/07/15 3:00 PM  Result Value Ref Range   Sodium 142 135 - 145 mmol/L  Urinalysis, Routine w reflex microscopic (not at [facility]) Status: None   Collection Time: 02/07/15 3:00 PM  Result Value Ref Range   Color, Urine YELLOW YELLOW   APPearance CLEAR CLEAR   Specific Gravity, Urine 1.007 1.005 - 1.030   pH 7.0 5.0 - 8.0   Glucose, UA NEGATIVE NEGATIVE mg/dL   Hgb urine dipstick NEGATIVE NEGATIVE   Bilirubin Urine NEGATIVE NEGATIVE   Ketones, ur NEGATIVE NEGATIVE mg/dL   Protein, ur NEGATIVE NEGATIVE mg/dL   Urobilinogen, UA 0.2 0.0 - 1.0 mg/dL   Nitrite NEGATIVE NEGATIVE   Leukocytes, UA NEGATIVE NEGATIVE  Sodium Status: None   Collection Time: 02/07/15 9:04 PM  Result Value Ref Range   Sodium 143 135 - 145 mmol/L  Troponin I (q 6hr x 3) Status: None   Collection Time: 02/07/15 10:40 PM  Result Value Ref Range   Troponin I <0.03 <0.031 ng/mL  Basic metabolic panel Status: Abnormal   Collection Time: 02/07/15 10:40 PM  Result Value Ref Range   Sodium 143 135 - 145 mmol/L   Potassium 3.1 (L) 3.5 - 5.1 mmol/L   Chloride 102 101 - 111 mmol/L   CO2 32 22 - 32 mmol/L   Glucose, Bld 111 (H) 65 - 99 mg/dL   BUN 8 6 - 20 mg/dL   Creatinine, Ser 0.53 0.44 - 1.00 mg/dL   Calcium 8.3 (L) 8.9 - 10.3 mg/dL   GFR calc non  Af Amer >60 >60 mL/min   GFR calc Af Amer >60 >60 mL/min   Anion gap 9 5 - 15  Magnesium Status: None   Collection Time: 02/07/15 10:40 PM  Result Value Ref Range   Magnesium 2.0 1.7 - 2.4 mg/dL  CBC Status: Abnormal   Collection Time: 02/08/15 3:35 AM  Result Value Ref Range   WBC 11.9 (H) 4.0 - 10.5 K/uL   RBC 3.98 3.87 - 5.11 MIL/uL   Hemoglobin 12.0 12.0 - 15.0 g/dL   HCT 37.8 36.0 - 46.0 %   MCV 95.0 78.0 - 100.0 fL   MCH 30.2 26.0 - 34.0 pg   MCHC 31.7 30.0 - 36.0 g/dL   RDW 13.6 11.5 - 15.5 %   Platelets 199 150 - 400 K/uL  Basic metabolic panel Status: Abnormal   Collection Time: 02/08/15 3:35 AM  Result Value Ref Range   Sodium 142 135 - 145 mmol/L   Potassium 3.3 (L) 3.5 - 5.1 mmol/L   Chloride 102 101 - 111 mmol/L   CO2 31 22 - 32 mmol/L   Glucose, Bld 102 (H) 65 - 99 mg/dL   BUN 6 6 - 20 mg/dL   Creatinine, Ser 0.45 0.44 - 1.00 mg/dL   Calcium 8.3 (L) 8.9 - 10.3 mg/dL   GFR calc non  Af Amer >60 >60 mL/min   GFR calc Af Amer >60 >60 mL/min   Anion gap 9 5 - 15  Troponin I (q 6hr x 3) Status: None   Collection Time: 02/08/15 3:35 AM  Result Value Ref Range   Troponin I 0.03 <0.031 ng/mL      Imaging Results (Last 48 hours)    Ct Head Wo Contrast  02/06/2015 CLINICAL DATA: 80 year old female post evacuation right cerebellar hematoma. Dizziness with nausea. Speech difficulty with trouble writing. Subsequent exam. EXAM: CT HEAD WITHOUT CONTRAST TECHNIQUE: Contiguous axial images were obtained from the base of the skull through the vertex without intravenous contrast. COMPARISON: 02/05/2015 CT. FINDINGS: Post right occipital craniotomy for drainage of right cerebellar hematoma. Although there has been debulking of the central aspect of the hematoma, right cerebellar hematoma remains spanning over 4.1 x 3.5 cm versus prior  4.4 x 3.5 cm. Surrounding vasogenic edema and mass effect have progressed slightly. Compression of the fourth ventricle has progressed since the prior exam. Right cerebellar tonsil extension into the foramen magnum and upward herniation of the right cerebellum. Compression of the right aspect of the quadrigeminal plate has progressed slightly. Ventricular size including asymmetric mild prominence of the left temporal horn relatively similar to prior exam. Close follow-up recommended to exclude developing hydrocephalus. Small amount of pneumocephalus Remote mid right coronal radiata infarct. Small vessel disease type changes. Vascular calcifications. IMPRESSION: Post right occipital craniotomy for drainage of right cerebellar hematoma. Although there has been debulking of the central aspect of the hematoma, right cerebellar hematoma remains spanning over 4.1 x 3.5 cm versus prior 4.4 x 3.5 cm. Surrounding vasogenic edema and mass effect have progressed slightly. Compression of the fourth ventricle has progressed since the prior exam. Right cerebellar tonsil extension into the foramen magnum and upward herniation of the right cerebellum. Compression of the right aspect of the quadrigeminal plate has progressed slightly. Ventricular size including asymmetric mild prominence of the left temporal horn relatively similar to prior exam. Patient is at risk for development of hydrocephalus. Close follow-up recommended. These results will be called to the ordering clinician or representative by the Radiologist Assistant, and communication documented in the PACS or zVision Dashboard. Electronically Signed By: Genia Del M.D. On: 02/06/2015 12:55     Assessment/Plan: Diagnosis: Right cerebellar hemorrhage status post decompression with severe right hemiataxia 1. Does the need for close, 24 hr/day medical supervision in concert with the patient's rehab needs make it unreasonable for this patient to be  served in a less intensive setting? Yes 2. Co-Morbidities requiring supervision/potential complications: Atrial fibrillation with rapid ventricular response, cerebral edema 3. Due to bladder management, bowel management, safety, skin/wound care, disease management, medication administration, pain management and patient education, does the patient require 24 hr/day rehab nursing? Yes 4. Does the patient require coordinated care of a physician, rehab nurse, PT (1-2 hrs/day, 5 days/week), OT (1-2 hrs/day, 5 days/week) and SLP (0.5-1 hrs/day, 5 days/week) to address physical and functional deficits in the context of the above medical diagnosis(es)? Yes Addressing deficits in the following areas: balance, endurance, locomotion, strength, transferring, bowel/bladder control, bathing, dressing, feeding, grooming, toileting and cognition 5. Can the patient actively participate in an intensive therapy program of at least 3 hrs of therapy per day at least 5 days per week? No 6. The potential for patient to make measurable gains while on inpatient rehab is good once medically stable 7. Anticipated functional outcomes upon discharge from inpatient rehab are min assist with PT, min  assist with OT, supervision with SLP. 8. Estimated rehab length of stay to reach the above functional goals is: 14-18 days 9. Does the patient have adequate social supports and living environment to accommodate these discharge functional goals? Potentially 10. Anticipated D/C setting: Home 11. Anticipated post D/C treatments: [place] therapy 12. Overall Rehab/Functional Prognosis: good  RECOMMENDATIONS: This patient's condition is appropriate for continued rehabilitative care in the following setting: CIR once cerebral edema is under control and off hypertonic saline Patient has agreed to participate in recommended program. Potentially Note that insurance prior authorization may be required for reimbursement for recommended  care.  Comment:     02/08/2015       Revision History     Date/Time User Provider Type Action   02/09/2015 1:53 PM Susan Blake, MD Physician Sign   02/08/2015 6:19 AM Cathlyn Parsons, PA-C Physician Assistant Pend   View Details Report       Routing History     Date/Time From To Method   02/09/2015 1:53 PM Susan Blake, MD Susan Blake, MD In Basket   02/09/2015 1:53 PM Susan Blake, MD Janith Lima, MD In [facility]

## 2015-02-14 NOTE — Progress Notes (Signed)
Rehab admissions - Patient has now been medically cleared for acute inpatient rehab admission.  Bed available and will admit to inpatient rehab today.  Call me for questions.  #[phone]

## 2015-02-14 NOTE — Discharge Summary (Addendum)
Physician Discharge Summary  Patient ID: [name] MRN: [id number] DOB/AGE: Nov 29, 1934 80 y.o.  Admit date: 02/05/2015 Discharge date: 02/14/2015  Admission Diagnoses: Cerebellar hemorrhage  Discharge Diagnoses: Same Active Problems:   Cerebellar hemorrhage   Acute cerebellar hemorrhage   Hypokalemia   Chronic combined systolic and diastolic congestive heart failure   Atrial fibrillation with RVR   HLD (hyperlipidemia)   Obstructive hydrocephalus   Dizziness   Cytotoxic cerebral edema   Discharged Condition: Stable  Hospital Course:  [name] is a 80 y.o. female Initially admitted after  Presenting to the emergency department with headache and dizziness.  CT scan demonstrated a large right-sided cerebellar lobar hematoma.  The patient was taken for surgical evacuation, and subsequently monitored in the intensive care unit.  Routine postoperative scanning demonstrated reaccumulation of the hematoma, however the patient's neurologic status remained good.  She was therefore managed conservatively with continued observation, hypertonic saline, and steroids.  She maintained a good neurologic exam, with  Continued but slowly improving double vision, and incoordination on the right side.  She was working with physical and occupational therapy, and deemed to be a good candidate for inpatient rehabilitation.  Serial CT scans demonstrated progressive contraction of the cerebellar hematoma, and mild, stable hydrocephalus. Her Recovery was complicated by atrial fibrillation with rapid ventricular response, and she was seen by cardiology.  Rate was well controlled on by mouth Cardizem and metoprolol.  Treatments: Surgery - Right suboccipital retrosigmoid  Craniotomy for evacuation of hematoma  Discharge Exam: Blood pressure 110/70, pulse 95, temperature 98.2 F (36.8 C), temperature source Oral, resp. rate 20, height _0  (1.676 m), weight 74.2 kg (163 lb 9.3 oz), last  menstrual period 12/03/2002, SpO2 95 %. Awake, alert, oriented Speech fluent, appropriate CN grossly intact 5/5 BUE/BLE Wound c/d/i  Disposition: 01-Home or Self Care     Medication List    STOP taking these medications        aspirin 81 MG tablet      TAKE these medications        amoxicillin 500 MG capsule  Commonly known as:  AMOXIL  Prior to dentist appointment     atorvastatin 10 MG tablet  Commonly known as:  LIPITOR  Take 1 tablet (10 mg total) by mouth daily at 6 PM.     bethanechol 25 MG tablet  Commonly known as:  URECHOLINE  Take 1 tablet (25 mg total) by mouth 3 (three) times daily.     diltiazem 60 MG tablet  Commonly known as:  CARDIZEM  Take 1 tablet (60 mg total) by mouth 4 (four) times daily.     latanoprost 0.005 % ophthalmic solution  Commonly known as:  XALATAN  Place 1 drop into both eyes at bedtime.     levalbuterol 45 MCG/ACT inhaler  Commonly known as:  XOPENEX HFA  Inhale 1-2 puffs into the lungs every 4 (four) hours as needed for wheezing.     metoprolol 50 MG tablet  Commonly known as:  LOPRESSOR  Take 1 tablet (50 mg total) by mouth 3 (three) times daily.     pantoprazole 40 MG tablet  Commonly known as:  PROTONIX  Take 40 mg by mouth daily.     SIMBRINZA 1-0.2 % Susp  Generic drug:  Brinzolamide-Brimonidine  Place 1 drop into both eyes 2 (two) times daily.           Follow-up Information    Follow up with SETHI,PRAMOD, MD In 2  months.   Specialties:  Neurology, Radiology   Why:  stroke clinic, office will call you for follow up appointment   Contact information:   [address] [place] [address] [phone]       Follow up with [facility], Nena Polio, C, MD In 1 month.   Specialty:  Neurosurgery   Contact information:   [address] [place] [address] [phone]       Signed: Jairo Ben 02/14/2015, 1:34 PM

## 2015-02-14 NOTE — Progress Notes (Signed)
Report called to rehab, AVS printed and included with chart to rehab. Daughters with pt.

## 2015-02-14 NOTE — Progress Notes (Signed)
Pt & family aware of when to f/u with Dr. Leonie Man & Dr. Kathyrn Sheriff.

## 2015-02-14 NOTE — Progress Notes (Signed)
     Subjective:  Feeling better. Denies CP/SOB  Objective:  Temp:  [97 F (36.1 C)-98.5 F (36.9 C)] 97 F (36.1 C) (06/13 0813) Pulse Rate:  [1-123] 106 (06/13 0955) Resp:  [18-24] 20 (06/13 0955) BP: (94-154)/(57-111) 121/62 mmHg (06/13 0955) SpO2:  [93 %-96 %] 93 % (06/13 0955) Weight change:   Intake/Output from previous day: 06/12 0701 - 06/13 0700 In: 1010 [P.O.:960; I.V.:50] Out: 1125 [Urine:1125]  Intake/Output from this shift: Total I/O In: 100 [P.O.:100] Out: -   Physical Exam: General appearance: alert and no distress Neck: no adenopathy, no carotid bruit, no JVD, supple, symmetrical, trachea midline and thyroid not enlarged, symmetric, no tenderness/mass/nodules Lungs: clear to auscultation bilaterally Heart: irregularly irregular rhythm Extremities: extremities normal, atraumatic, no cyanosis or edema  Lab Results: Results for orders placed or performed during the hospital encounter of 02/05/15 (from the past 48 hour(s))  Sodium     Status: Abnormal   Collection Time: 02/12/15  3:25 PM  Result Value Ref Range   Sodium 147 (H) 135 - 145 mmol/L  Sodium     Status: None   Collection Time: 02/12/15  8:50 PM  Result Value Ref Range   Sodium 145 135 - 145 mmol/L  Sodium     Status: Abnormal   Collection Time: 02/13/15  2:40 AM  Result Value Ref Range   Sodium 147 (H) 135 - 145 mmol/L  Sodium     Status: None   Collection Time: 02/14/15  5:40 AM  Result Value Ref Range   Sodium 142 135 - 145 mmol/L    Imaging: Imaging results have been reviewed  Tele- Afib with VR 100  Assessment/Plan:   1. Active Problems: 2.   Cerebellar hemorrhage 3.   Acute cerebellar hemorrhage 4.   Hypokalemia 5.   Chronic combined systolic and diastolic congestive heart failure 6.   Atrial fibrillation with RVR 7.   HLD (hyperlipidemia) 8.   Obstructive hydrocephalus 9.   Dizziness 10.   Cytotoxic cerebral edema 11.   Time Spent Directly with Patient:  20  minutes  Length of Stay:  LOS: 9 days   Pt apparently being transferred to rehab. Afib with VR 100 on metop 50 mg q8 hours. Can consolidate to 75 mg PO BID and increase to 100 mg PO BID depending on HR and BP. No AC secondary to Cerebellar hemorrhage. Will need OP F/U with a MLP 7-10 days then with Dr. Stanford Breed after that. Will S/O.  Quay Burow 02/14/2015, 11:11 AM

## 2015-02-15 ENCOUNTER — Inpatient Hospital Stay (HOSPITAL_COMMUNITY): Payer: Medicare Other | Admitting: Physical Therapy

## 2015-02-15 ENCOUNTER — Inpatient Hospital Stay (HOSPITAL_COMMUNITY): Payer: Medicare Other | Admitting: Occupational Therapy

## 2015-02-15 ENCOUNTER — Inpatient Hospital Stay (HOSPITAL_COMMUNITY): Payer: Medicare Other | Admitting: Speech Pathology

## 2015-02-15 LAB — CBC WITH DIFFERENTIAL/PLATELET
Basophils Absolute: 0 10*3/uL (ref 0.0–0.1)
Basophils Relative: 0 % (ref 0–1)
EOS ABS: 0.2 10*3/uL (ref 0.0–0.7)
Eosinophils Relative: 2 % (ref 0–5)
HCT: 38.2 % (ref 36.0–46.0)
Hemoglobin: 12.5 g/dL (ref 12.0–15.0)
Lymphocytes Relative: 21 % (ref 12–46)
Lymphs Abs: 2.1 10*3/uL (ref 0.7–4.0)
MCH: 30.7 pg (ref 26.0–34.0)
MCHC: 32.7 g/dL (ref 30.0–36.0)
MCV: 93.9 fL (ref 78.0–100.0)
Monocytes Absolute: 1.2 10*3/uL — ABNORMAL HIGH (ref 0.1–1.0)
Monocytes Relative: 12 % (ref 3–12)
Neutro Abs: 6.4 10*3/uL (ref 1.7–7.7)
Neutrophils Relative %: 65 % (ref 43–77)
PLATELETS: 244 10*3/uL (ref 150–400)
RBC: 4.07 MIL/uL (ref 3.87–5.11)
RDW: 14.4 % (ref 11.5–15.5)
WBC: 10 10*3/uL (ref 4.0–10.5)

## 2015-02-15 LAB — COMPREHENSIVE METABOLIC PANEL
ALT: 20 U/L (ref 14–54)
ANION GAP: 9 (ref 5–15)
AST: 25 U/L (ref 15–41)
Albumin: 3.1 g/dL — ABNORMAL LOW (ref 3.5–5.0)
Alkaline Phosphatase: 88 U/L (ref 38–126)
BILIRUBIN TOTAL: 1 mg/dL (ref 0.3–1.2)
BUN: 20 mg/dL (ref 6–20)
CO2: 34 mmol/L — ABNORMAL HIGH (ref 22–32)
CREATININE: 0.79 mg/dL (ref 0.44–1.00)
Calcium: 8.5 mg/dL — ABNORMAL LOW (ref 8.9–10.3)
Chloride: 96 mmol/L — ABNORMAL LOW (ref 101–111)
GFR calc Af Amer: >60 mL/min (ref >60)
GLUCOSE: 94 mg/dL (ref 65–99)
POTASSIUM: 3 mmol/L — AB (ref 3.5–5.1)
Sodium: 139 mmol/L (ref 135–145)
Total Protein: 5.6 g/dL — ABNORMAL LOW (ref 6.5–8.1)

## 2015-02-15 LAB — CREATININE, SERUM
Creatinine, Ser: 0.85 mg/dL (ref 0.44–1.00)
GFR calc Af Amer: >60 mL/min (ref >60)
GFR calc non Af Amer: >60 mL/min (ref >60)

## 2015-02-15 MED ORDER — POTASSIUM CHLORIDE CRYS ER 20 MEQ PO TBCR
20.0000 meq | EXTENDED_RELEASE_TABLET | Freq: Every day | ORAL | Status: DC
  Administered 2015-02-15 – 2015-02-20 (×6): 20 meq via ORAL
  Filled 2015-02-15 (×7): qty 1

## 2015-02-15 NOTE — Progress Notes (Signed)
LBM 02/12/15. Pt refusing Sorbitol and Colace at this time. Agreeable to Senna. Family and pt states that irregular bowel movement due to poor po intake.  L elbow pink, abrasions on L forearm. Refusing R elbow protector and tegaderm to braised area.

## 2015-02-15 NOTE — Progress Notes (Signed)
Social Work Assessment and Plan Social Work Assessment and Plan  Patient Details  Name: [name] MRN: [id number] Date of Birth: 12/27/1934  Today's Date: 02/15/2015  Problem List:  Patient Active Problem List   Diagnosis Date Noted  . 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 Dizziness   . Cytotoxic cerebral edema   . Acute cerebellar hemorrhage   . Hypokalemia   . Chronic combined systolic and diastolic congestive heart failure   . Atrial fibrillation with RVR   . HLD (hyperlipidemia)   . Obstructive hydrocephalus   . Cerebellar hemorrhage 02/05/2015  . COPD bronchitis 12/30/2014  . GERD (gastroesophageal reflux disease) 12/30/2014  . Bladder cancer 01/01/2014  . CHF (congestive heart failure) 01/01/2014  . Glaucoma 01/01/2014  . DCIS (ductal carcinoma in situ) of breast 01/01/2014   Past Medical History:  Past Medical History  Diagnosis Date  . History of CHF (congestive heart failure)     POST SURG 2010  . COPD (chronic obstructive pulmonary disease)   . History of breast cancer     DX DCIS IN 2004--  S/P RIGHT MASTECTOMY , NO CHEMORADIATION---  NO RECURRENCE  . Glaucoma of both eyes   . History of atrial fibrillation without current medication 2010    POST SURG 2010--  PER PT NO ISSUES SINCE  . Arthritis   . Dyspnea on exertion   . GERD (gastroesophageal reflux disease)   . Nephrolithiasis   . History of shingles     02/ 2015--  back of neck and left flank--  no residual pain  . History of hypertension   . Recurrent bladder papillary carcinoma first dx 06/ 2014    s/p  turbt's  and instillation mitomycin c (chemo)   Past Surgical History:  Past Surgical History  Procedure Laterality Date  . Total knee arthroplasty Right 03-22-2009  . Partial mastectomy with needle localization Right 01-14-2003    DCIS  . Total mastectomy Right 02-03-2003    W/ SLN BX  AND  POST 03-01-2003 EVACUATION HEMATOMA  . Removal cyst left hand  2013  . Transthoracic  echocardiogram  03-25-2009    MILD LVF/ EF 70-80%/ MILD INCREASE SYSTOLIC PULMONARY  PRESSURE  . Transurethral resection of bladder tumor with gyrus (turbt-gyrus) N/A 03/26/2013    Procedure: TRANSURETHRAL RESECTION OF BLADDER TUMOR WITH GYRUS (TURBT-GYRUS);  Surgeon: Alexis Frock, MD;  Location: [facility];  Service: Urology;  Laterality: N/A;  . Cystoscopy w/ ureteral stent placement Bilateral 03/26/2013    Procedure: CYSTOSCOPY WITH BILATERAL RETROGRADE PYELOGRAM/URETERAL STENT PLACEMENT;  Surgeon: Alexis Frock, MD;  Location[facility];  Service: Urology;  Laterality: Bilateral;  . Transurethral resection of bladder tumor with gyrus (turbt-gyrus) N/A 05/13/2013    Procedure: TRANSURETHRAL RESECTION OF BLADDER TUMOR WITH GYRUS (TURBT-GYRUS)  RE-STAGING TRANSURETHRAL RESECTION OF BLADDER TUMOR, LEFT RETROGRADE PYELOGRAM AND STENT EXCHANGE;  Surgeon: Alexis Frock, MD;  Location: [facility];  Service: Urology;  Laterality: N/A;  . Cystoscopy w/ ureteral stent placement Left 05/13/2013    Procedure: CYSTOSCOPY WITH RETROGRADE PYELOGRAM/URETERAL STENT PLACEMENT STENT EXCHANGE;  Surgeon: Alexis Frock, MD;  Location: [facility];  Service: Urology;  Laterality: Left;  . Tubal ligation    . Cataract extraction w/ intraocular lens  implant, bilateral    . Transurethral resection of bladder tumor with gyrus (turbt-gyrus) N/A 08/11/2014    Procedure: TRANSURETHRAL RESECTION OF BLADDER TUMOR WITH GYRUS (TURBT-GYRUS);  Surgeon: Alexis Frock, MD;  Location: [facility];  Service: Urology;  Laterality: N/A;  . Cystoscopy w/ retrogrades Bilateral 08/11/2014    Procedure: CYSTOSCOPY WITH BILATERAL RETROGRADE PYELOGRAM AND MITOMYCIN INSTILLATION;  Surgeon: Alexis Frock, MD;  Location: [facility];  Service: Urology;  Laterality: Bilateral;  . Craniectomy N/A 02/05/2015    Procedure: SUBOCCIPITAL CRANIECTOMY EVACUATION  OF HEMATOMA;  Surgeon: Consuella Lose, MD;  Location: [facility];  Service: Neurosurgery;  Laterality: N/A;   Social History:  reports that she quit smoking about 11 years ago. Her smoking use included Cigarettes. She has a 40 pack-year smoking history. She has never used smokeless tobacco. She reports that she does not drink alcohol or use illicit drugs.  Family / Support Systems Marital Status: Married Patient Roles: Spouse, Parent Spouse/Significant OtherJeneen Rinks [phone]  [phone] Children: [name]-daughter [phone] cell Other Supports: Son whom lives with pt and dad, three daughters Anticipated Caregiver: Son, husband and daughter's Ability/Limitations of Caregiver: Husband works Biochemist, clinical, son Radiation protection practitioner and daughter's will adjust their schedules to assist Caregiver Availability: 24/7 Family Dynamics: Close knit family who are involved and supportive.  Daughter from [place] here staying with pt out of school for summer. Other daughter in [place] and one down the street from parents. All are comitted to their parents and want to provide what pt needs.  Social History Preferred language: English Religion:  Cultural Background: No issues Education: High School Read: Yes Write: Yes Employment Status: Retired Freight forwarder Issues: No issues Guardian/Conservator: None-according to MD pt is capable of making her own decisions while here, but will make sure another family member is present if any decisions needed to be made.   Abuse/Neglect Physical Abuse: Denies Verbal Abuse: Denies Sexual Abuse: Denies Exploitation of patient/patient's resources: Denies Self-Neglect: Denies  Emotional Status Pt's affect, behavior adn adjustment status: Pt is motivated but exhausted from am therapies, she needs them spaced out more.  She is having blurry vision and closes her eye at times to see people in the room. She has always been independent and wants to be again  from this. Her daughter's are here and very supportive of pt, encouraging her. Recent Psychosocial Issues: other health issues-but all were managed prior to this stroke Pyschiatric History: No history deferred depression screen due to exhausted from therapies, will monitor through therapies and have neruo-psych see if would benefit from seeing while here. She has a strong faith and this is what pulss her through. Substance Abuse History: No issues  Patient / Family Perceptions, Expectations & Goals Pt/Family understanding of illness & functional limitations: Pt and daughter's have a good understanding of Mom's condition.  Here daily with her and ask questions regarding her care, treatment plan and goals of treatment.  Daughter voiced someone will be here all of the time with her, they feel more comfprtable with this. Premorbid pt/family roles/activities: Wife, Mom, grandmother, retiree, church member, Health visitor, etc Anticipated changes in roles/activities/participation: resume Pt/family expectations/goals: Pt states; " I want to do for myself or atleast try too."  Daughter states: " We will do or make arrangements for what our Mom needs from [place]."    [employer]: None Premorbid Home Care/DME Agencies: None Transportation available at discharge: family Resource referrals recommended: Neuropsychology, Support group (specify)  Discharge Planning Living Arrangements: Spouse/significant other, Children Support Systems: Spouse/significant other, Children, Other relatives, Water engineer, Social worker community Type of Residence: Private residence Insurance underwriter Resources: [employer], Multimedia programmer (specify) Nurse, mental health) Financial Resources: Fish farm manager, Family Support Financial Screen Referred: No Living  Expenses: Lives with family Money Management: Patient, Spouse Does the patient have any problems obtaining your medications?: No Home Management: She was doing home  management before this  Patient/Family Preliminary Plans: Return home with husband and son, with daughter's assisting with her  care.  Awaiting her goals and progress to formulate a plan of care.  One daughter is a Education officer, museum and out for the summer. Will work with all on a safe discharge plan. Social Work Anticipated Follow Up Needs: HH/OP, Support Group  Clinical Impression Pleasant tired patient who is willing to work in therapies even though exhausted.  She wants to recover from this stroke and regain her independence. Her children are very supportive and willing to assist her at discharge. Awaiting team's evaluations and will develop a safe discharge plan. Daughter's plan to be here daily and observe in therapies to see her progress and know what to expect upon discharge. Aware team conference tomorrow.  Elease Hashimoto 02/15/2015, 1:40 PM

## 2015-02-15 NOTE — Plan of Care (Signed)
Problem: RH PAIN MANAGEMENT Goal: RH STG PAIN MANAGED AT OR BELOW PT'S PAIN GOAL Pain level 3 or less on a scale of 0-10.  Outcome: Progressing No c/o pain

## 2015-02-15 NOTE — Care Management Note (Signed)
[facility] Individual Statement of Services  Patient Name:  [name]  Date:  02/15/2015  Welcome to the [facility].  Our goal is to provide you with an individualized program based on your diagnosis and situation, designed to meet your specific needs.  With this comprehensive rehabilitation program, you will be expected to participate in at least 3 hours of rehabilitation therapies Monday-Friday, with modified therapy programming on the weekends.  Your rehabilitation program will include the following services:  Physical Therapy (PT), Occupational Therapy (OT), Speech Therapy (ST), 24 hour per day rehabilitation nursing, Therapeutic Recreaction (TR), Case Management (Social Worker), Rehabilitation Medicine, Nutrition Services and Pharmacy Services  Weekly team conferences will be held on Wednesday to discuss your progress.  Your Social Worker will talk with you frequently to get your input and to update you on team discussions.  Team conferences with you and your family in attendance may also be held.  Expected length of stay: 14-16 days Overall anticipated outcome: supervision with set up  Depending on your progress and recovery, your program may change. Your Social Worker will coordinate services and will keep you informed of any changes. Your Social Worker's name and contact numbers are listed  below.  The following services may also be recommended but are not provided by the [facility] will be made to provide these services after discharge if needed.  Arrangements include referral to agencies that provide these services.  Your insurance has been verified to be:  Medicare & [place] Your primary doctor is:  [name]  Pertinent information will be shared with your doctor and your insurance  company.  Social Worker:  Ovidio Kin, [place] or ([phone]  Information discussed with and copy given to patient by: Elease Hashimoto, 02/15/2015, 9:19 AM

## 2015-02-15 NOTE — Evaluation (Signed)
Speech Language Pathology Assessment and Plan  Patient Details  Name: [name] MRN: [id number] Date of Birth: [birth year]  SLP Diagnosis: Cognitive Impairments  Rehab Potential: Good ELOS: 14-16 days    Today's Date: 02/15/2015 SLP Individual Time: 1330-1430 SLP Individual Time Calculation (min): 60 min   Problem List:  Patient Active Problem List   Diagnosis Date Noted  . 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 Dizziness   . Cytotoxic cerebral edema   . Acute cerebellar hemorrhage   . Hypokalemia   . Chronic combined systolic and diastolic congestive heart failure   . Atrial fibrillation with RVR   . HLD (hyperlipidemia)   . Obstructive hydrocephalus   . Cerebellar hemorrhage 02/05/2015  . COPD bronchitis 12/30/2014  . GERD (gastroesophageal reflux disease) 12/30/2014  . Bladder cancer 01/01/2014  . CHF (congestive heart failure) 01/01/2014  . Glaucoma 01/01/2014  . DCIS (ductal carcinoma in situ) of breast 01/01/2014   Past Medical History:  Past Medical History  Diagnosis Date  . History of CHF (congestive heart failure)     POST SURG 2010  . COPD (chronic obstructive pulmonary disease)   . History of breast cancer     DX DCIS IN 2004--  S/P RIGHT MASTECTOMY , NO CHEMORADIATION---  NO RECURRENCE  . Glaucoma of both eyes   . History of atrial fibrillation without current medication 2010    POST SURG 2010--  PER PT NO ISSUES SINCE  . Arthritis   . Dyspnea on exertion   . GERD (gastroesophageal reflux disease)   . Nephrolithiasis   . History of shingles     02/ 2015--  back of neck and left flank--  no residual pain  . History of hypertension   . Recurrent bladder papillary carcinoma first dx 06/ 2014    s/p  turbt's  and instillation mitomycin c (chemo)   Past Surgical History:  Past Surgical History  Procedure Laterality Date  . Total knee arthroplasty Right 03-22-2009  . Partial mastectomy with needle localization Right 01-14-2003    DCIS   . Total mastectomy Right 02-03-2003    W/ SLN BX  AND  POST 03-01-2003 EVACUATION HEMATOMA  . Removal cyst left hand  2013  . Transthoracic echocardiogram  03-25-2009    MILD LVF/ EF 70-80%/ MILD INCREASE SYSTOLIC PULMONARY  PRESSURE  . Transurethral resection of bladder tumor with gyrus (turbt-gyrus) N/A 03/26/2013    Procedure: TRANSURETHRAL RESECTION OF BLADDER TUMOR WITH GYRUS (TURBT-GYRUS);  Surgeon: Alexis Frock, MD;  Location: [facility];  Service: Urology;  Laterality: N/A;  . Cystoscopy w/ ureteral stent placement Bilateral 03/26/2013    Procedure: CYSTOSCOPY WITH BILATERAL RETROGRADE PYELOGRAM/URETERAL STENT PLACEMENT;  Surgeon: Alexis Frock, MD;  [facility];  Service: Urology;  Laterality: Bilateral;  . Transurethral resection of bladder tumor with gyrus (turbt-gyrus) N/A 05/13/2013    Procedure: TRANSURETHRAL RESECTION OF BLADDER TUMOR WITH GYRUS (TURBT-GYRUS)  RE-STAGING TRANSURETHRAL RESECTION OF BLADDER TUMOR, LEFT RETROGRADE PYELOGRAM AND STENT EXCHANGE;  Surgeon: Alexis Frock, MD;  Location: [facility];  Service: Urology;  Laterality: N/A;  . Cystoscopy w/ ureteral stent placement Left 05/13/2013    Procedure: CYSTOSCOPY WITH RETROGRADE PYELOGRAM/URETERAL STENT PLACEMENT STENT EXCHANGE;  Surgeon: Alexis Frock, MD;  Location: [facility];  Service: Urology;  Laterality: Left;  . Tubal ligation    . Cataract extraction w/ intraocular lens  implant, bilateral    . Transurethral resection of bladder tumor with gyrus (turbt-gyrus) N/A  08/11/2014    Procedure: TRANSURETHRAL RESECTION OF BLADDER TUMOR WITH GYRUS (TURBT-GYRUS);  Surgeon: Alexis Frock, MD;  Location: [facility];  Service: Urology;  Laterality: N/A;  . Cystoscopy w/ retrogrades Bilateral 08/11/2014    Procedure: CYSTOSCOPY WITH BILATERAL RETROGRADE PYELOGRAM AND MITOMYCIN INSTILLATION;  Surgeon: Alexis Frock, MD;  Location:  [facility];  Service: Urology;  Laterality: Bilateral;  . Craniectomy N/A 02/05/2015    Procedure: SUBOCCIPITAL CRANIECTOMY EVACUATION OF HEMATOMA;  Surgeon: Consuella Lose, MD;  Location: [place] [facility] [facility];  Service: Neurosurgery;  Laterality: N/A;    Assessment / Plan / Recommendation Clinical Impression  [name] is a 80 y.o. right handed female with history of hypertension, transient atrial fibrillation maintained on aspirin, diastolic congestive heart failure, COPD, recurrent bladder papillary carcinoma as well as breast cancer with mastectomy. Patient lives with husband and independent prior to admission. Presented 02/05/2015 with dizziness and nausea/vomiting as well as bouts of blurred vision.  CT of the head showed a 4 cm acute hemorrhage within the right cerebellar hemisphere with surrounding edema and mass effect on the fourth ventricle as well as lacunar infarct of the right caudate head appeared to be remote. Underwent right retrosigmoid suboccipital craniotomy, evacuation of hematoma 02/05/2015.  Patient is on a regular consistency diet.  Patient was admitted for comprehensive rehabilitation program on 02/14/2015.  SLP evaluation completed 02/15/2015 with the following results: Pt presents with mild cognitive deficits characterized by mild impulsivity, decreased functional problem solving and decreased emergent/anticipatory awareness of deficits.  Pt's cognitive deficits are further exacerbated by ataxia, diplopia, and fatigue.  Pt was very independent prior to admission and the abovementioned deficits impact pt's ability to complete self care and/or home management tasks safely.  As a result, pt would benefit from skilled ST while inpatient in order to maximize functional independence and reduce burden of care prior to discharge.  Anticipate that pt will need 24/7 supervision at discharge and assistance for medication and financial management.     Skilled Therapeutic  Interventions          Cognitive-linguistic evaluation completed with results and recommendations reviewed with patient and family.     SLP Assessment  Patient will need skilled [facility] during CIR admission    Recommendations  Patient destination: Home Follow up Recommendations: Other (comment) (TBD) Equipment Recommended: None recommended by SLP    SLP Frequency 3 to 5 out of 7 days   SLP Treatment/Interventions Cognitive remediation/compensation;Cueing hierarchy;Patient/family education;Internal/external aids;Environmental controls    Pain Pain Assessment Pain Assessment: No/denies pain Pain Score: 0-No pain Prior Functioning Cognitive/Linguistic Baseline: Within functional limits Type of Home: House  Lives With: Spouse;Son Available Help at Discharge: Family;Available 24 hours/day Education: some college  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  SLP Short Term Goal 2 (Week 1): Pt will complete semi-copmlex self care and/or home management tasks with supervision for problem solving.   SLP Short Term Goal 3 (Week 1): Pt will anticipate at least 2 ways in which her current deficits will impact her ability to complete self care/home management tasks safely and will generate appropriate solutions to problems with supervision.   See FIM for current functional status Refer to Care Plan for Long Term Goals  Recommendations for other services: None  Discharge  Criteria: Patient will be discharged from SLP if patient refuses treatment 3 consecutive times without medical reason, if treatment goals not met, if there is a change in medical status, if patient makes no progress towards goals or if patient is discharged from hospital.  The above assessment,  treatment plan, treatment alternatives and goals were discussed and mutually agreed upon: by patient and by family  Page, Selinda Orion 02/15/2015, 4:21 PM

## 2015-02-15 NOTE — Progress Notes (Signed)
80 y.o. right handed female with history of hypertension, transient atrial fibrillation maintained on aspirin, diastolic congestive heart failure, COPD, recurrent bladder papillary carcinoma as well as breast cancer with mastectomy. Patient lives with husband and independent prior to admission. Presented 02/05/2015 with dizziness and nausea/vomiting as well as bouts of blurred vision. Denies any recent fall or head trauma. CT of the head showed a 4 cm acute hemorrhage within the right cerebellar hemisphere with surrounding edema and mass effect on the fourth ventricle as well as lacunar infarct of the right caudate head appeared to be remote. Underwent right retrosigmoid suboccipital craniotomy, evacuation of hematoma 02/05/2015 per Dr. Kathyrn Sheriff. Hospital course blood pressure management. Subcutaneous heparin was later initiated for DVT prophylaxis 02/06/2015.   Subjective/Complaints: Slept ok Closes R eye because it is "blurry" Denies headache, some dizziness and double vision, no hiccups, n/v, denies diarhea or contipation, no joint or muscle pain Objective: Vital Signs: Blood pressure 122/67, pulse 86, temperature 97.6 F (36.4 C), temperature source Oral, resp. rate 17, weight 72.984 kg (160 lb 14.4 oz), last menstrual period 12/03/2002, SpO2 93 %. No results found. Results for orders placed or performed during the hospital encounter of 02/14/15 (from the past 72 hour(s))  CBC     Status: Abnormal   Collection Time: 02/14/15 10:00 PM  Result Value Ref Range   WBC 10.9 (H) 4.0 - 10.5 K/uL   RBC 3.86 (L) 3.87 - 5.11 MIL/uL   Hemoglobin 11.9 (L) 12.0 - 15.0 g/dL   HCT 36.1 36.0 - 46.0 %   MCV 93.5 78.0 - 100.0 fL   MCH 30.8 26.0 - 34.0 pg   MCHC 33.0 30.0 - 36.0 g/dL   RDW 14.2 11.5 - 15.5 %   Platelets 269 150 - 400 K/uL  Creatinine, serum     Status: None   Collection Time: 02/14/15 10:00 PM  Result Value Ref Range   Creatinine, Ser 0.85 0.44 - 1.00 mg/dL   GFR calc non Af Amer >60  >60 mL/min   GFR calc Af Amer >60 >60 mL/min    Comment: (NOTE) The eGFR has been calculated using the CKD EPI equation. This calculation has not been validated in all clinical situations. eGFR's persistently <60 mL/min signify possible Chronic Kidney Disease.   CBC WITH DIFFERENTIAL     Status: Abnormal   Collection Time: 02/15/15  4:55 AM  Result Value Ref Range   WBC 10.0 4.0 - 10.5 K/uL   RBC 4.07 3.87 - 5.11 MIL/uL   Hemoglobin 12.5 12.0 - 15.0 g/dL   HCT 38.2 36.0 - 46.0 %   MCV 93.9 78.0 - 100.0 fL   MCH 30.7 26.0 - 34.0 pg   MCHC 32.7 30.0 - 36.0 g/dL   RDW 14.4 11.5 - 15.5 %   Platelets 244 150 - 400 K/uL   Neutrophils Relative % 65 43 - 77 %   Neutro Abs 6.4 1.7 - 7.7 K/uL   Lymphocytes Relative 21 12 - 46 %   Lymphs Abs 2.1 0.7 - 4.0 K/uL   Monocytes Relative 12 3 - 12 %   Monocytes Absolute 1.2 (H) 0.1 - 1.0 K/uL   Eosinophils Relative 2 0 - 5 %   Eosinophils Absolute 0.2 0.0 - 0.7 K/uL   Basophils Relative 0 0 - 1 %   Basophils Absolute 0.0 0.0 - 0.1 K/uL  Comprehensive metabolic panel     Status: Abnormal   Collection Time: 02/15/15  4:55 AM  Result Value Ref Range  Sodium 139 135 - 145 mmol/L   Potassium 3.0 (L) 3.5 - 5.1 mmol/L   Chloride 96 (L) 101 - 111 mmol/L   CO2 34 (H) 22 - 32 mmol/L   Glucose, Bld 94 65 - 99 mg/dL   BUN 20 6 - 20 mg/dL   Creatinine, Ser 0.79 0.44 - 1.00 mg/dL   Calcium 8.5 (L) 8.9 - 10.3 mg/dL   Total Protein 5.6 (L) 6.5 - 8.1 g/dL   Albumin 3.1 (L) 3.5 - 5.0 g/dL   AST 25 15 - 41 U/L   ALT 20 14 - 54 U/L   Alkaline Phosphatase 88 38 - 126 U/L   Total Bilirubin 1.0 0.3 - 1.2 mg/dL   GFR calc non Af Amer >60 >60 mL/min   GFR calc Af Amer >60 >60 mL/min    Comment: (NOTE) The eGFR has been calculated using the CKD EPI equation. This calculation has not been validated in all clinical situations. eGFR's persistently <60 mL/min signify possible Chronic Kidney Disease.    Anion gap 9 5 - 15     HEENT: normal Cardio: RRR and  no murmur Resp: CTA B/L and unlabored GI: BS positive and NT, ND Extremity:  Pulses positive and No Edema Skin:   Wound C/D/I and ecchymotic right suboccipital  Neuro: Alert/Oriented, Normal Sensory, Normal Motor, Abnormal FMC Ataxic/ dec FMC and Other severe dysmetria FNF and HS,+horz nystagmus, EOMI Musc/Skel:  Normal Gen NAD   Assessment/Plan: 1. 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 Physiatrist is providing close team supervision and 24 hour management of active medical problems listed below. Physiatrist and rehab team continue to assess barriers to discharge/monitor patient progress toward functional and medical goals. FIM:                   Comprehension Comprehension Mode: Auditory Comprehension: 6-Follows complex conversation/direction: With extra time/assistive device  Expression Expression: 5-Expresses basic needs/ideas: With no assist  Social Interaction Social Interaction: 5-Interacts appropriately 90% of the time - Needs monitoring or encouragement for participation or interaction.  Problem Solving Problem Solving: 5-Solves complex 90% of the time/cues < 10% of the time  Memory Memory: 6-More than reasonable amt of time  Medical Problem List and Plan: 1. Functional deficits secondary to right cerebellar hemorrhage status post craniotomy evacuation of hematoma 02/05/2015 2. DVT Prophylaxis/Anticoagulation: Subcutaneous heparin initiated 02/06/2015. Monitor for any bleeding episodes 3. Pain Management: Tylenol as needed 4. A. fib with RVR. Continue Cardizem 60 mg 4 times a day, Lopressor 50 mg 3 times a day. Cardiac rate controlled. 5. Neuropsych: This patient is capable of making decisions on her own behalf. 6. Skin/Wound Care: Routine skin checks 7. Fluids/Electrolytes/Nutrition: Routine I nose with follow-up chemistries 8. Diastolic  congestive heart failure. No signs of fluid overload. Follow-up per cardiology services 9. COPD. Nebulizers as directed. No shortness of breath reported 10. Recurrent bladder papillary carcinoma as well as history of breast cancer with mastectomy. 11. Hyperlipidemia. Lipitor 12. Constipation. Laxed assistance.  LOS (Days) 1 A FACE TO FACE EVALUATION WAS PERFORMED  KIRSTEINS,ANDREW E 02/15/2015, 6:56 AM

## 2015-02-15 NOTE — Evaluation (Signed)
Occupational Therapy Assessment and Plan  Patient Details  Name: [name] MRN: [id number] Date of Birth: July 02, 1935  OT Diagnosis: ataxia, disturbance of vision, hemiplegia affecting dominant side and muscle weakness (generalized) Rehab Potential: Rehab Potential (ACUTE ONLY): Good ELOS: 14-16 days   Today's Date: 02/15/2015 OT Individual Time: 0034-9179 OT Individual Time Calculation (min): 70 min     Problem List:  Patient Active Problem List   Diagnosis Date Noted  . 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 Dizziness   . Cytotoxic cerebral edema   . Acute cerebellar hemorrhage   . Hypokalemia   . Chronic combined systolic and diastolic congestive heart failure   . Atrial fibrillation with RVR   . HLD (hyperlipidemia)   . Obstructive hydrocephalus   . Cerebellar hemorrhage 02/05/2015  . COPD bronchitis 12/30/2014  . GERD (gastroesophageal reflux disease) 12/30/2014  . Bladder cancer 01/01/2014  . CHF (congestive heart failure) 01/01/2014  . Glaucoma 01/01/2014  . DCIS (ductal carcinoma in situ) of breast 01/01/2014    Past Medical History:  Past Medical History  Diagnosis Date  . History of CHF (congestive heart failure)     POST SURG 2010  . COPD (chronic obstructive pulmonary disease)   . History of breast cancer     DX DCIS IN 2004--  S/P RIGHT MASTECTOMY , NO CHEMORADIATION---  NO RECURRENCE  . Glaucoma of both eyes   . History of atrial fibrillation without current medication 2010    POST SURG 2010--  PER PT NO ISSUES SINCE  . Arthritis   . Dyspnea on exertion   . GERD (gastroesophageal reflux disease)   . Nephrolithiasis   . History of shingles     02/ 2015--  back of neck and left flank--  no residual pain  . History of hypertension   . Recurrent bladder papillary carcinoma first dx 06/ 2014    s/p  turbt's  and instillation mitomycin c (chemo)   Past Surgical History:  Past Surgical History  Procedure Laterality Date  . Total knee  arthroplasty Right 03-22-2009  . Partial mastectomy with needle localization Right 01-14-2003    DCIS  . Total mastectomy Right 02-03-2003    W/ SLN BX  AND  POST 03-01-2003 EVACUATION HEMATOMA  . Removal cyst left hand  2013  . Transthoracic echocardiogram  03-25-2009    MILD LVF/ EF 70-80%/ MILD INCREASE SYSTOLIC PULMONARY  PRESSURE  . Transurethral resection of bladder tumor with gyrus (turbt-gyrus) N/A 03/26/2013    Procedure: TRANSURETHRAL RESECTION OF BLADDER TUMOR WITH GYRUS (TURBT-GYRUS);  Surgeon: Alexis Frock, MD;  Location: [facility];  Service: Urology;  Laterality: N/A;  . Cystoscopy w/ ureteral stent placement Bilateral 03/26/2013    Procedure: CYSTOSCOPY WITH BILATERAL RETROGRADE PYELOGRAM/URETERAL STENT PLACEMENT;  Surgeon: Alexis Frock, MD;  Location: [facility];  Service: Urology;  Laterality: Bilateral;  . Transurethral resection of bladder tumor with gyrus (turbt-gyrus) N/A 05/13/2013    Procedure: TRANSURETHRAL RESECTION OF BLADDER TUMOR WITH GYRUS (TURBT-GYRUS)  RE-STAGING TRANSURETHRAL RESECTION OF BLADDER TUMOR, LEFT RETROGRADE PYELOGRAM AND STENT EXCHANGE;  Surgeon: Alexis Frock, MD;  Location: [facility];  Service: Urology;  Laterality: N/A;  . Cystoscopy w/ ureteral stent placement Left 05/13/2013    Procedure: CYSTOSCOPY WITH RETROGRADE PYELOGRAM/URETERAL STENT PLACEMENT STENT EXCHANGE;  Surgeon: Alexis Frock, MD;  Location: [facility];  Service: Urology;  Laterality: Left;  . Tubal ligation    . Cataract extraction w/ intraocular lens  implant,  bilateral    . Transurethral resection of bladder tumor with gyrus (turbt-gyrus) N/A 08/11/2014    Procedure: TRANSURETHRAL RESECTION OF BLADDER TUMOR WITH GYRUS (TURBT-GYRUS);  Surgeon: Alexis Frock, MD;  Location: [facility];  Service: Urology;  Laterality: N/A;  . Cystoscopy w/ retrogrades Bilateral 08/11/2014    Procedure: CYSTOSCOPY WITH  BILATERAL RETROGRADE PYELOGRAM AND MITOMYCIN INSTILLATION;  Surgeon: Alexis Frock, MD;  Location: [facility];  Service: Urology;  Laterality: Bilateral;  . Craniectomy N/A 02/05/2015    Procedure: SUBOCCIPITAL CRANIECTOMY EVACUATION OF HEMATOMA;  Surgeon: Consuella Lose, MD;  Location: [facility];  Service: Neurosurgery;  Laterality: N/A;    Assessment & Plan Clinical Impression: Patient is a 80 y.o. right handed female with history of hypertension, transient atrial fibrillation maintained on aspirin, diastolic congestive heart failure, COPD, recurrent bladder papillary carcinoma as well as breast cancer with mastectomy. Patient lives with husband and independent prior to admission. Presented 02/05/2015 with dizziness and nausea/vomiting as well as bouts of blurred vision. Denies any recent fall or head trauma. CT of the head showed a 4 cm acute hemorrhage within the right cerebellar hemisphere with surrounding edema and mass effect on the fourth ventricle as well as lacunar infarct of the right caudate head appeared to be remote. Underwent right retrosigmoid suboccipital craniotomy, evacuation of hematoma 02/05/2015 per Dr. Kathyrn Sheriff. Hospital course blood pressure management. Subcutaneous heparin was later initiated for DVT prophylaxis 02/06/2015. Follow-up cranial CT scans shows that right cerebellar hematoma remains spanning over a 4.1 x 3.5 cm versus 4.4 x 3.5 cm as well as surrounding vasogenic edema mildly progressed. A follow-up cranial CT scan for 02/08/2015 with a small amount of mild hydrocephalus advised monitoring and unchanged with repeat scan 02/13/2015. Placed on 3% saline to decrease any cerebral edema and later discontinued.. Cardiology consulted 02/08/2015 for a noted history of transient atrial fibrillation had been on aspirin prior to admission patient with bouts of A. fib RVR and was placed on Cardizem with rate controlled. She was not a candidate for systemic  anticoagulation given her hemorrhage. Echocardiogram with ejection fraction 19% grade 1 diastolic dysfunction. Patient is on a regular consistency diet physical and occupational therapy evaluations completed and ongoing with recommendations of physical medicine rehabilitation consult.    Patient transferred to CIR on 02/14/2015 .    Patient currently requires mod with basic self-care skills secondary to muscle weakness, impaired timing and sequencing, unbalanced muscle activation, ataxia and decreased coordination, decreased visual perceptual skills and decreased visual motor skills and decreased sitting balance, decreased standing balance, decreased postural control, hemiplegia and decreased balance strategies.  Prior to hospitalization, patient could complete ADLs with independent .  Patient will benefit from skilled intervention to increase independence with basic self-care skills and increase level of independence with iADL prior to discharge home with care partner.  Anticipate patient will require 24 hour supervision and follow up outpatient.  OT - End of Session Activity Tolerance: Tolerates 30+ min activity without fatigue Endurance Deficit: No OT Assessment Rehab Potential (ACUTE ONLY): Good OT Patient demonstrates impairments in the following area(s): Balance;Motor;Perception;Safety;Vision OT Basic ADL's Functional Problem(s): Eating;Grooming;Bathing;Dressing;Toileting OT Advanced ADL's Functional Problem(s): Simple Meal Preparation;Laundry OT Transfers Functional Problem(s): Toilet;Tub/Shower OT Additional Impairment(s): Fuctional Use of Upper Extremity OT Plan OT Intensity: Minimum of 1-2 x/day, 45 to 90 minutes OT Frequency: 5 out of 7 days OT Duration/Estimated Length of Stay: 14-16 days OT Treatment/Interventions: Balance/vestibular training;Discharge planning;Disease Lawyer;Functional mobility training;Neuromuscular re-education;Pain  management;Patient/family education;Psychosocial support;Self Care/advanced  ADL retraining;Therapeutic Activities;Therapeutic Exercise;UE/LE Strength taining/ROM;UE/LE Coordination activities;Visual/perceptual remediation/compensation OT Self Feeding Anticipated Outcome(s): Supervision/setup OT Basic Self-Care Anticipated Outcome(s): Supervision OT Toileting Anticipated Outcome(s): Supervision OT Bathroom Transfers Anticipated Outcome(s): Supervision OT Recommendation Patient destination: Home Follow Up Recommendations: Outpatient OT;24 hour supervision/assistance Equipment Recommended: Tub/shower seat;To be determined   Skilled Therapeutic Intervention OT eval completed with discussion of OT purpose, POC, estimated goals and LOS.  ADL assessment completed at EOB with min assist for sitting balance with pt demonstrating Rt posterior lean.  Pt reports dizziness of 5/10 upon sitting EOB and throughout session stated dizziness remaining about 5/10 despite cues for gaze stabilization during tasks.  Squat pivot bed > BSC with mod assist with improved trunk control with low pivot.  Rt posterior lean in standing with toileting hygiene with tactile cues to promote forward and upright standing posture.  Pt required assist to thread underwear and don socks and shoes due to dizziness with bending forward.  Educated on hemi-dressing technique with pt able to carryover with donning pants.  Pt with nystagmus with Lt gaze and tends to keep Rt eye closed during functional tasks to attempt to decrease blurriness and double vision.  Pt left semi-reclined in bed with two daughters present.  OT Evaluation Precautions/Restrictions  Precautions Precautions: Fall Precaution Comments: truncal ataxia Restrictions Weight Bearing Restrictions: No Pain Pain Assessment Pain Assessment: No/denies pain Home Living/Prior Functioning Home Living Family/patient expects to be discharged to:: Private residence Living  Arrangements: Spouse/significant other, Children Available Help at Discharge: Family Type of Home: House Home Access: Stairs to enter Technical brewer of Steps: 2 Entrance Stairs-Rails: None Home Layout: Two level, Able to live on main level with bedroom/bathroom Alternate Level Stairs-Number of Steps: flight, has 2 steps down into sunken den Alternate Level Stairs-Rails: Right, Left  Lives With: Spouse, Son IADL History Homemaking Responsibilities: Yes Meal Prep Responsibility: Secondary Laundry Responsibility: Primary Cleaning Responsibility: Primary Shopping Responsibility: Primary Current License: Yes Mode of Transportation: Car Prior Function Level of Independence: Independent with basic ADLs  Able to Take Stairs?: Yes Driving: Yes Vocation: Retired Comments: drove, ran errands ADL  See FIM Vision/Perception  Vision- History Baseline Vision/History: Wears glasses Wears Glasses: At all times Patient Visual Report: Diplopia;Blurring of vision Vision- Assessment Vision Assessment?: Yes Eye Alignment: Impaired (comment) Ocular Range of Motion: Restricted on the right Tracking/Visual Pursuits: Right eye does not track laterally;Decreased smoothness of horizontal tracking;Decreased smoothness of vertical tracking;Decreased smoothness of eye movement to RIGHT superior field;Decreased smoothness of eye movement to LEFT superior field;Decreased smoothness of eye movement to RIGHT inferior field;Decreased smoothness of eye movement to LEFT inferior field;Requires cues, head turns, or add eye shifts to track;Unable to hold eye position out of midline Visual Fields: Right visual field deficit Diplopia Assessment: Disappears with one eye closed;Objects split side to side Additional Comments: Nystagmus with Lt gaze  Cognition Overall Cognitive Status: Within Functional Limits for tasks assessed Arousal/Alertness: Awake/alert Orientation Level: Person;Place;Situation Person:  Oriented Place: Oriented Situation: Oriented Year: 2016 Month: June Day of Week: Correct Memory: Appears intact Immediate Memory Recall: Sock;Blue;Bed Memory Recall: Sock;Blue;Bed Memory Recall Sock: Without Cue Memory Recall Blue: Without Cue Memory Recall Bed: Without Cue Attention: Sustained Sustained Attention: Appears intact Behaviors: Impulsive Safety/Judgment: Impaired Sensation Sensation Light Touch: Appears Intact Stereognosis: Not tested Hot/Cold: Not tested Proprioception: Impaired by gross assessment Coordination Fine Motor Movements are Fluid and Coordinated: No Finger Nose Finger Test: severe dysmetria on Rt > Lt Extremity/Trunk Assessment RUE Assessment RUE Assessment: Within Functional Limits (ROM WFL, strength  grossly 4/5 overall) LUE Assessment LUE Assessment: Within Functional Limits (ROM WFL, strength grossly 4/5 overall)  FIM:  FIM - Grooming Grooming Steps: Wash, rinse, dry face;Wash, rinse, dry hands Grooming: 3: Patient completes 2 of 4 or 3 of 5 steps FIM - Bathing Bathing Steps Patient Completed: Chest;Abdomen;Front perineal area;Buttocks;Right upper leg;Left upper leg Bathing: 3: Mod-Patient completes 5-7 [id number] parts or 50-74% FIM - Upper Body Dressing/Undressing Upper body dressing/undressing steps patient completed: Thread/unthread right bra strap;Thread/unthread left bra strap;Hook/unhook bra;Thread/unthread right sleeve of front closure shirt/dress;Thread/unthread left sleeve of front closure shirt/dress;Pull shirt around back of front closure shirt/dress;Button/unbutton shirt Upper body dressing/undressing: 4: Steadying assist FIM - Lower Body Dressing/Undressing Lower body dressing/undressing steps patient completed: Thread/unthread right pants leg;Pull underwear up/down;Thread/unthread left pants leg;Pull pants up/down Lower body dressing/undressing: 2: Max-Patient completed 25-49% of tasks FIM - Toileting Toileting steps completed by  patient: Adjust clothing prior to toileting;Adjust clothing after toileting;Performs perineal hygiene Toileting Assistive Devices: Grab bar or rail for support Toileting: 4: Steadying assist FIM - Bed/Chair Transfer Bed/Chair Transfer: 3: Supine > Sit: Mod A (lifting assist/Pt. 50-74%/lift 2 legs;4: Sit > Supine: Min A (steadying pt. > 75%/lift 1 leg);3: Bed > Chair or W/C: Mod A (lift or lower assist);3: Chair or W/C > Bed: Mod A (lift or lower assist) FIM - TRadio producerDevices: Bedside commode Toilet Transfers: 3-To toilet/BSC: Mod A (lift or lower assist);3-From toilet/BSC: Mod A (lift or lower assist)   Refer to Care Plan for Long Term Goals  Recommendations for other services: None  Discharge Criteria: Patient will be discharged from OT if patient refuses treatment 3 consecutive times without medical reason, if treatment goals not met, if there is a change in medical status, if patient makes no progress towards goals or if patient is discharged from hospital.  The above assessment, treatment plan, treatment alternatives and goals were discussed and mutually agreed upon: by patient and by family  HEllwood DenseSGundersen Luth Med Ctr6/14/2016, 10:36 AM

## 2015-02-15 NOTE — Evaluation (Signed)
Physical Therapy Assessment and Plan  Patient Details  Name: [name] MRN: [id number] Date of Birth: 01/07/1935  PT Diagnosis: Abnormal posture, Abnormality of gait, Ataxia, Ataxic gait, Difficulty walking, Dizziness and giddiness, Hemiparesis dominant and Muscle weakness Rehab Potential: Good ELOS: 14-16 days   Today's Date: 02/15/2015 PT Individual Time: 8372-9021 PT Individual Time Calculation (min): 70 min    Problem List:  Patient Active Problem List   Diagnosis Date Noted  . 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 Dizziness   . Cytotoxic cerebral edema   . Acute cerebellar hemorrhage   . Hypokalemia   . Chronic combined systolic and diastolic congestive heart failure   . Atrial fibrillation with RVR   . HLD (hyperlipidemia)   . Obstructive hydrocephalus   . Cerebellar hemorrhage 02/05/2015  . COPD bronchitis 12/30/2014  . GERD (gastroesophageal reflux disease) 12/30/2014  . Bladder cancer 01/01/2014  . CHF (congestive heart failure) 01/01/2014  . Glaucoma 01/01/2014  . DCIS (ductal carcinoma in situ) of breast 01/01/2014    Past Medical History:  Past Medical History  Diagnosis Date  . History of CHF (congestive heart failure)     POST SURG 2010  . COPD (chronic obstructive pulmonary disease)   . History of breast cancer     DX DCIS IN 2004--  S/P RIGHT MASTECTOMY , NO CHEMORADIATION---  NO RECURRENCE  . Glaucoma of both eyes   . History of atrial fibrillation without current medication 2010    POST SURG 2010--  PER PT NO ISSUES SINCE  . Arthritis   . Dyspnea on exertion   . GERD (gastroesophageal reflux disease)   . Nephrolithiasis   . History of shingles     02/ 2015--  back of neck and left flank--  no residual pain  . History of hypertension   . Recurrent bladder papillary carcinoma first dx 06/ 2014    s/p  turbt's  and instillation mitomycin c (chemo)   Past Surgical History:  Past Surgical History  Procedure Laterality Date  . Total  knee arthroplasty Right 03-22-2009  . Partial mastectomy with needle localization Right 01-14-2003    DCIS  . Total mastectomy Right 02-03-2003    W/ SLN BX  AND  POST 03-01-2003 EVACUATION HEMATOMA  . Removal cyst left hand  2013  . Transthoracic echocardiogram  03-25-2009    MILD LVF/ EF 70-80%/ MILD INCREASE SYSTOLIC PULMONARY  PRESSURE  . Transurethral resection of bladder tumor with gyrus (turbt-gyrus) N/A 03/26/2013    Procedure: TRANSURETHRAL RESECTION OF BLADDER TUMOR WITH GYRUS (TURBT-GYRUS);  Surgeon: Alexis Frock, MD;  Location: [facility];  Service: Urology;  Laterality: N/A;  . Cystoscopy w/ ureteral stent placement Bilateral 03/26/2013    Procedure: CYSTOSCOPY WITH BILATERAL RETROGRADE PYELOGRAM/URETERAL STENT PLACEMENT;  Surgeon: Alexis Frock, MD;  Location: [facility];  Service: Urology;  Laterality: Bilateral;  . Transurethral resection of bladder tumor with gyrus (turbt-gyrus) N/A 05/13/2013    Procedure: TRANSURETHRAL RESECTION OF BLADDER TUMOR WITH GYRUS (TURBT-GYRUS)  RE-STAGING TRANSURETHRAL RESECTION OF BLADDER TUMOR, LEFT RETROGRADE PYELOGRAM AND STENT EXCHANGE;  Surgeon: Alexis Frock, MD;  Location: [facility];  Service: Urology;  Laterality: N/A;  . Cystoscopy w/ ureteral stent placement Left 05/13/2013    Procedure: CYSTOSCOPY WITH RETROGRADE PYELOGRAM/URETERAL STENT PLACEMENT STENT EXCHANGE;  Surgeon: Alexis Frock, MD;  Location: [facility];  Service: Urology;  Laterality: Left;  . Tubal ligation    . Cataract extraction w/ intraocular lens  implant, bilateral    . Transurethral resection of bladder tumor with gyrus (turbt-gyrus) N/A 08/11/2014    Procedure: TRANSURETHRAL RESECTION OF BLADDER TUMOR WITH GYRUS (TURBT-GYRUS);  Surgeon: Alexis Frock, MD;  Location: [facility];  Service: Urology;  Laterality: N/A;  . Cystoscopy w/ retrogrades Bilateral 08/11/2014    Procedure: CYSTOSCOPY  WITH BILATERAL RETROGRADE PYELOGRAM AND MITOMYCIN INSTILLATION;  Surgeon: Alexis Frock, MD;  Location: [facility];  Service: Urology;  Laterality: Bilateral;  . Craniectomy N/A 02/05/2015    Procedure: SUBOCCIPITAL CRANIECTOMY EVACUATION OF HEMATOMA;  Surgeon: Consuella Lose, MD;  Location: [place] [facility];  Service: Neurosurgery;  Laterality: N/A;    Assessment & Plan Clinical Impression: Patient is a 80 y.o. right handed female with history of hypertension, transient atrial fibrillation maintained on aspirin, diastolic congestive heart failure, COPD, recurrent bladder papillary carcinoma as well as breast cancer with mastectomy. Patient lives with husband and independent prior to admission. Presented 02/05/2015 with dizziness and nausea/vomiting as well as bouts of blurred vision. Denies any recent fall or head trauma. CT of the head showed a 4 cm acute hemorrhage within the right cerebellar hemisphere with surrounding edema and mass effect on the fourth ventricle as well as lacunar infarct of the right caudate head appeared to be remote. Underwent right retrosigmoid suboccipital craniotomy, evacuation of hematoma 02/05/2015 per Dr. Kathyrn Sheriff. Hospital course blood pressure management. Subcutaneous heparin was later initiated for DVT prophylaxis 02/06/2015. Follow-up cranial CT scans shows that right cerebellar hematoma remains spanning over a 4.1 x 3.5 cm versus 4.4 x 3.5 cm as well as surrounding vasogenic edema mildly progressed. A follow-up cranial CT scan for 02/08/2015 with a small amount of mild hydrocephalus advised monitoring and unchanged with repeat scan 02/13/2015. Placed on 3% saline to decrease any cerebral edema and later discontinued.. Cardiology consulted 02/08/2015 for a noted history of transient atrial fibrillation had been on aspirin prior to admission patient with bouts of A. fib RVR and was placed on Cardizem with rate controlled. She was not a candidate for systemic  anticoagulation given her hemorrhage. Echocardiogram with ejection fraction 38% grade 1 diastolic dysfunction. Patient is on a regular consistency diet physical and occupational therapy evaluations completed and ongoing with recommendations of physical medicine rehabilitation consult.     Patient transferred to CIR on 02/14/2015 .    Patient currently requires maxA with mobility secondary to muscle weakness, decreased cardiorespiratoy endurance, ataxia and decreased coordination, decreased visual acuity, decreased visual motor skills and impaired VOR, decreased midline orientation, decreased problem solving, decreased safety awareness and impulsivity, central origin and decreased sitting balance, decreased standing balance, decreased postural control and decreased balance strategies.  Prior to hospitalization, patient was independent  with mobility and lived with Spouse, Son in a House home.  Home access is 6 2-inch stairs (2 from driveway to walkway, 2 into home, 2 from entry way into living room)Stairs to enter.  Patient will benefit from skilled PT intervention to maximize safe functional mobility, minimize fall risk and decrease caregiver burden for planned discharge home with 24 hour supervision.  Anticipate patient will benefit from follow up OP at discharge.  PT - End of Session Activity Tolerance: Tolerates 30+ min activity with multiple rests Endurance Deficit: Yes Endurance Deficit Description: Highly fatigued following previous evaluation, requires rest breaks throughout session PT Assessment Rehab Potential (ACUTE/IP ONLY): Good PT Patient demonstrates impairments in the following area(s): Balance;Endurance;Motor;Perception;Safety;Other (comment) (vestibular impairment) PT Transfers Functional Problem(s): Bed Mobility;Bed to Chair;Car;Furniture PT Locomotion Functional Problem(s):  Ambulation;Stairs;Wheelchair Mobility PT Plan PT Intensity: Minimum of 1-2 x/day ,45 to 90 minutes PT  Frequency: 5 out of 7 days PT Duration Estimated Length of Stay: 14-16 days PT Treatment/Interventions: Ambulation/gait training;Balance/vestibular training;DME/adaptive equipment instruction;Discharge planning;Functional mobility training;Therapeutic Activities;Therapeutic Exercise;Wheelchair propulsion/positioning;UE/LE Strength taining/ROM;UE/LE Chief Strategy Officer education;Neuromuscular re-education;Community reintegration PT Transfers Anticipated Outcome(s): supervision PT Locomotion Anticipated Outcome(s): supervision PT Recommendation Recommendations for Other Services: Neuropsych consult;Speech consult Follow Up Recommendations: 24 hour supervision/assistance;Outpatient PT Patient destination: Home Equipment Recommended: To be determined  Skilled Therapeutic Intervention Pt received supine in bed with two daughters and son present; no c/o pain and agreeable to treatment however reports she is very fatigued from previous OT session. Initial evaluation performed and completed. Pt and family educated on role of physical therapy to improve function and safety prior to d/c home. Initiated gait training, stair training, w/c management, and transfers. Initial gait trial performed with +2 HHA; second trial performed with RW and modA +2 due to strong posterolateral trunk lean and impaired orientation to vertical. Vision and vestibular assessment performed with identified impairments described below. Pt very fatigued during session, requiring several rest breaks. Pt returned to bed at completion of session due to fatigue and left with family members present and all needs within reach.   PT Evaluation Precautions/Restrictions Precautions Precautions: Fall Precaution Comments: truncal ataxia Restrictions Weight Bearing Restrictions: No General Chart Reviewed: Yes PT Amount of Missed Time (min): 5 Minutes PT Missed Treatment Reason: Patient fatigue Response to  Previous Treatment: Patient reporting fatigue but able to participate. Family/Caregiver Present: Yes (Two daughters and son)   Vital SignsTherapy Vitals BP: 108/60 mmHg Pain Pain Assessment Pain Assessment: No/denies pain Pain Score: 0-No pain Home Living/Prior Functioning Home Living Living Arrangements: Spouse/significant other;Children Available Help at Discharge: Family Type of Home: House Home Access: Stairs to enter [employer] of Steps: 6 2-inch stairs (2 from driveway to walkway, 2 into home, 2 from entry way into living room) Entrance Stairs-Rails: None Home Layout: Two level;Able to live on main level with bedroom/bathroom Alternate Level Stairs-Number of Steps: flight Alternate Level Stairs-Rails: Right;Left  Lives With: Spouse;Son Prior Function Level of Independence: Independent with basic ADLs;Independent with gait;Independent with homemaking with ambulation;Independent with transfers  Able to Take Stairs?: Yes Driving: Yes Vocation: Retired Comments: drove, ran errands Vision/Perception  Vision - Assessment Eye Alignment: Impaired (comment) Ocular Range of Motion: Restricted on the right Tracking/Visual Pursuits: Decreased smoothness of horizontal tracking;Decreased smoothness of vertical tracking Saccades: Additional eye shifts occurred during testing;Overshoots;Decreased speed of saccadic movement Convergence: Impaired (comment) Diplopia Assessment: Disappears with one eye closed;Objects split side to side;Only with right gaze Additional Comments: Presents with direction changing, gaze evoke nystagmus in all directions. Impaired VOR cancellation and slow VOR with retinal slip observed. Pt presents with impaired gaze stabilization and x 1 viewing and education initiated with patient.  Cognition Overall Cognitive Status: Within Functional Limits for tasks assessed Arousal/Alertness: Awake/alert Orientation Level: Oriented X4 Attention:  Sustained Sustained Attention: Appears intact Memory: Appears intact Awareness: Appears intact Behaviors: Impulsive Safety/Judgment: Impaired Comments: Poor awareness of deficits Sensation Sensation Light Touch: Appears Intact Stereognosis: Not tested Hot/Cold: Not tested Proprioception: Impaired by gross assessment Coordination Gross Motor Movements are Fluid and Coordinated: No Fine Motor Movements are Fluid and Coordinated: No Coordination and Movement Description: ataxia R>L Finger Nose Finger Test: severe dysmetria on Rt > Lt Heel Shin Test: severe dysmetria R>L Motor  Motor Motor: Hemiplegia;Ataxia;Abnormal postural alignment and control Motor - Skilled Clinical Observations: Impaired perception of vertical, R  posterolateral trunk lean in sitting/standing unable to correct/maintain with max cues; truncal and limb ataxia R>L  Mobility Bed Mobility Bed Mobility: Rolling Right;Rolling Left;Right Sidelying to Sit;Sit to Supine Rolling Right: 5: Supervision;With rail Rolling Left: With rail;5: Supervision Right Sidelying to Sit: 4: Min assist;HOB flat Right Sidelying to Sit Details: Verbal cues for sequencing;Manual facilitation for placement;Tactile cues for posture Sit to Supine: 4: Min assist;HOB flat Sit to Supine - Details: Verbal cues for sequencing;Visual cues/gestures for sequencing Locomotion  Ambulation Ambulation: Yes Ambulation/Gait Assistance: 2: Max assist;1: +2 Total assist Ambulation Distance (Feet): 30 Feet Assistive device: 2 person hand held assist Ambulation/Gait Assistance Details: Tactile cues for posture;Manual facilitation for weight shifting;Visual cues/gestures for sequencing;Tactile cues for weight shifting Ambulation/Gait Assistance Details: continuous/repetitive Verbal/tactile cues needed for weight shifting to L due to strong R posterolateral bias,  Gait Gait: Yes Gait Pattern: Impaired Gait Pattern: Narrow base of  support;Scissoring;Ataxic;Lateral trunk lean to right Product manager Mobility: Yes Wheelchair Assistance: 4: Min Tour manager: Both lower extermities Wheelchair Parts Management: Needs assistance Distance: 40  Trunk/Postural Assessment  Cervical Assessment Cervical Assessment: Within Functional Limits Thoracic Assessment Thoracic Assessment: Within Functional Limits Lumbar Assessment Lumbar Assessment: Within Functional Limits Postural Control Postural Control: Deficits on evaluation Righting Reactions: slow and inadequate; ataxic Protective Responses: slowed and inadequate; ataxic  Balance Balance Balance Assessed: Yes Static Sitting Balance Static Sitting - Balance Support: Feet supported;No upper extremity supported Dynamic Sitting Balance Dynamic Sitting - Balance Support: Feet supported Sitting balance - Comments: Standby A Static Standing Balance Static Standing - Balance Support:  (modA) Dynamic Standing Balance Dynamic Standing - Balance Support:  (mod/maxA +2) Extremity Assessment      RLE Assessment RLE Assessment: Exceptions to [facility] RLE AROM (degrees) Overall AROM Right Lower Extremity: Within functional limits for tasks assessed RLE Strength RLE Overall Strength: Deficits (Hip flexion 4-/5, knee flexion/extension 4/5, ankle dorsiflexion 4+/5) LLE Assessment LLE Assessment: Exceptions to WFL LLE AROM (degrees) Overall AROM Left Lower Extremity: Within functional limits for tasks assessed LLE Strength LLE Overall Strength: Deficits (Hip flexion 4-/5, knee flexion/extension 4/5, ankle dorsiflexion 5/5)  FIM:  FIM - Bed/Chair Transfer Bed/Chair Transfer Assistive Devices: Arm rests Bed/Chair Transfer: 4: Supine > Sit: Min A (steadying Pt. > 75%/lift 1 leg);5: Sit > Supine: Supervision (verbal cues/safety issues);2: Chair or W/C > Bed: Max A (lift and lower assist);2: Bed > Chair or W/C: Max A (lift and lower assist) FIM -  Locomotion: Wheelchair Distance: 40 Locomotion: Wheelchair: 1: Travels less than 50 ft with supervision, cueing or coaxing FIM - Locomotion: Ambulation Locomotion: Ambulation Assistive Devices: Administrator Ambulation/Gait Assistance: 2: Max assist;1: +2 Total assist Locomotion: Ambulation: 1: Two helpers FIM - Locomotion: Stairs Locomotion: Scientist, physiological: Insurance account manager - 2 Locomotion: Stairs: 1: Two helpers   Refer to [employer] for Long Term Goals  Recommendations for other services: Neuropsych  Discharge Criteria: Patient will be discharged from PT if patient refuses treatment 3 consecutive times without medical reason, if treatment goals not met, if there is a change in medical status, if patient makes no progress towards goals or if patient is discharged from hospital.  The above assessment, treatment plan, treatment alternatives and goals were discussed and mutually agreed upon: by patient and by family  Luberta Mutter 02/15/2015, 3:09 PM

## 2015-02-15 NOTE — Progress Notes (Signed)
Patient information reviewed and entered into eRehab system by Daiva Nakayama, RN, CRRN, [facility] Coordinator.  Information including medical coding and functional independence measure will be reviewed and updated through discharge.     Per nursing patient was given "Data Collection Information Summary for Patients in Inpatient Rehabilitation Facilities with attached "Privacy Act [place] Records" upon admission.

## 2015-02-16 ENCOUNTER — Inpatient Hospital Stay (HOSPITAL_COMMUNITY): Payer: Medicare Other | Admitting: Occupational Therapy

## 2015-02-16 ENCOUNTER — Inpatient Hospital Stay (HOSPITAL_COMMUNITY): Payer: Medicare Other | Admitting: Speech Pathology

## 2015-02-16 ENCOUNTER — Telehealth: Payer: Self-pay | Admitting: *Deleted

## 2015-02-16 ENCOUNTER — Inpatient Hospital Stay (HOSPITAL_COMMUNITY): Payer: Medicare Other | Admitting: Physical Therapy

## 2015-02-16 LAB — URINALYSIS, ROUTINE W REFLEX MICROSCOPIC
Bilirubin Urine: NEGATIVE
Glucose, UA: NEGATIVE mg/dL
Ketones, ur: 15 mg/dL — AB
Nitrite: POSITIVE — AB
Protein, ur: 30 mg/dL — AB
SPECIFIC GRAVITY, URINE: 1.025 (ref 1.005–1.030)
UROBILINOGEN UA: 1 mg/dL (ref 0.0–1.0)
pH: 6 (ref 5.0–8.0)

## 2015-02-16 LAB — URINE MICROSCOPIC-ADD ON

## 2015-02-16 NOTE — Progress Notes (Signed)
80 y.o. right handed female  CT of the head showed a 4 cm acute hemorrhage within the right cerebellar hemisphere with surrounding edema and mass effect on the fourth ventricle as well as lacunar infarct of the right caudate head appeared to be remote. Underwent right retrosigmoid suboccipital craniotomy, evacuation of hematoma 02/05/2015 per Dr. Kathyrn Sheriff. Hospital course blood pressure management. Subcutaneous heparin was later initiated for DVT prophylaxis 02/06/2015.   Subjective/Complaints: No issues overnite but has poor appetite, urine is dark, no burning wit urination Daughter at bedside Denies headache, some dizziness and double vision, no hiccups, n/v, + constipation, no joint or muscle pain Objective: Vital Signs: Blood pressure 131/73, pulse 88, temperature 97.6 F (36.4 C), temperature source Oral, resp. rate 16, weight 72.984 kg (160 lb 14.4 oz), last menstrual period 12/03/2002, SpO2 95 %. No results found. Results for orders placed or performed during the hospital encounter of 02/14/15 (from the past 72 hour(s))  CBC     Status: Abnormal   Collection Time: 02/14/15 10:00 PM  Result Value Ref Range   WBC 10.9 (H) 4.0 - 10.5 K/uL   RBC 3.86 (L) 3.87 - 5.11 MIL/uL   Hemoglobin 11.9 (L) 12.0 - 15.0 g/dL   HCT 36.1 36.0 - 46.0 %   MCV 93.5 78.0 - 100.0 fL   MCH 30.8 26.0 - 34.0 pg   MCHC 33.0 30.0 - 36.0 g/dL   RDW 14.2 11.5 - 15.5 %   Platelets 269 150 - 400 K/uL  Creatinine, serum     Status: None   Collection Time: 02/14/15 10:00 PM  Result Value Ref Range   Creatinine, Ser 0.85 0.44 - 1.00 mg/dL   GFR calc non Af Amer >60 >60 mL/min   GFR calc Af Amer >60 >60 mL/min    Comment: (NOTE) The eGFR has been calculated using the CKD EPI equation. This calculation has not been validated in all clinical situations. eGFR's persistently <60 mL/min signify possible Chronic Kidney Disease.   CBC WITH DIFFERENTIAL     Status: Abnormal   Collection Time: 02/15/15  4:55 AM   Result Value Ref Range   WBC 10.0 4.0 - 10.5 K/uL   RBC 4.07 3.87 - 5.11 MIL/uL   Hemoglobin 12.5 12.0 - 15.0 g/dL   HCT 38.2 36.0 - 46.0 %   MCV 93.9 78.0 - 100.0 fL   MCH 30.7 26.0 - 34.0 pg   MCHC 32.7 30.0 - 36.0 g/dL   RDW 14.4 11.5 - 15.5 %   Platelets 244 150 - 400 K/uL   Neutrophils Relative % 65 43 - 77 %   Neutro Abs 6.4 1.7 - 7.7 K/uL   Lymphocytes Relative 21 12 - 46 %   Lymphs Abs 2.1 0.7 - 4.0 K/uL   Monocytes Relative 12 3 - 12 %   Monocytes Absolute 1.2 (H) 0.1 - 1.0 K/uL   Eosinophils Relative 2 0 - 5 %   Eosinophils Absolute 0.2 0.0 - 0.7 K/uL   Basophils Relative 0 0 - 1 %   Basophils Absolute 0.0 0.0 - 0.1 K/uL  Comprehensive metabolic panel     Status: Abnormal   Collection Time: 02/15/15  4:55 AM  Result Value Ref Range   Sodium 139 135 - 145 mmol/L   Potassium 3.0 (L) 3.5 - 5.1 mmol/L   Chloride 96 (L) 101 - 111 mmol/L   CO2 34 (H) 22 - 32 mmol/L   Glucose, Bld 94 65 - 99 mg/dL   BUN 20 6 -  20 mg/dL   Creatinine, Ser 0.79 0.44 - 1.00 mg/dL   Calcium 8.5 (L) 8.9 - 10.3 mg/dL   Total Protein 5.6 (L) 6.5 - 8.1 g/dL   Albumin 3.1 (L) 3.5 - 5.0 g/dL   AST 25 15 - 41 U/L   ALT 20 14 - 54 U/L   Alkaline Phosphatase 88 38 - 126 U/L   Total Bilirubin 1.0 0.3 - 1.2 mg/dL   GFR calc non Af Amer >60 >60 mL/min   GFR calc Af Amer >60 >60 mL/min    Comment: (NOTE) The eGFR has been calculated using the CKD EPI equation. This calculation has not been validated in all clinical situations. eGFR's persistently <60 mL/min signify possible Chronic Kidney Disease.    Anion gap 9 5 - 15     HEENT: normal Cardio: RRR and no murmur Resp: CTA B/L and unlabored GI: BS positive and NT, ND Extremity:  Pulses positive and No Edema Skin:   Wound C/D/I and ecchymotic right suboccipital  Neuro: Alert/Oriented, Normal Sensory, Normal Motor, Abnormal FMC Ataxic/ dec FMC and Other severe dysmetria FNF and HS,+horz nystagmus, EOMI Musc/Skel:  Normal Gen  NAD   Assessment/Plan: 1. 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 Physiatrist is providing close team supervision and 24 hour management of active medical problems listed below. Physiatrist and rehab team continue to assess barriers to discharge/monitor patient progress toward functional and medical goals. Team conference today please see physician documentation under team conference tab, met with team face-to-face to discuss problems,progress, and goals. Formulized individual treatment plan based on medical history, underlying problem and comorbidities. FIM: FIM - Bathing Bathing Steps Patient Completed: Chest, Abdomen, Front perineal area, Buttocks, Right upper leg, Left upper leg Bathing: 3: Mod-Patient completes 5-7 5f10 parts or 50-74%  FIM - Upper Body Dressing/Undressing Upper body dressing/undressing steps patient completed: Thread/unthread right bra strap, Thread/unthread left bra strap, Hook/unhook bra, Thread/unthread right sleeve of front closure shirt/dress, Thread/unthread left sleeve of front closure shirt/dress, Pull shirt around back of front closure shirt/dress, Button/unbutton shirt Upper body dressing/undressing: 4: Steadying assist FIM - Lower Body Dressing/Undressing Lower body dressing/undressing steps patient completed: Thread/unthread right pants leg, Pull underwear up/down, Thread/unthread left pants leg, Pull pants up/down Lower body dressing/undressing: 2: Max-Patient completed 25-49% of tasks  FIM - Toileting Toileting steps completed by patient: Adjust clothing prior to toileting, Adjust clothing after toileting, Performs perineal hygiene Toileting Assistive Devices: Grab bar or rail for support Toileting: 4: Steadying assist  FIM - TRadio producerDevices: BRecruitment consultantTransfers: 3-To toilet/BSC: Mod A  (lift or lower assist), 3-From toilet/BSC: Mod A (lift or lower assist)  FIM - BControl and instrumentation engineerDevices: Arm rests Bed/Chair Transfer: 4: Supine > Sit: Min A (steadying Pt. > 75%/lift 1 leg), 5: Sit > Supine: Supervision (verbal cues/safety issues), 2: Chair or W/C > Bed: Max A (lift and lower assist), 2: Bed > Chair or W/C: Max A (lift and lower assist)  FIM - Locomotion: Wheelchair Distance: 40 Locomotion: Wheelchair: 1: Travels less than 50 ft with supervision, cueing or coaxing FIM - Locomotion: Ambulation Locomotion: Ambulation Assistive Devices: WAdministratorAmbulation/Gait Assistance: 2: Max assist, 1: +2 Total assist Locomotion: Ambulation: 1: Two helpers  Comprehension Comprehension Mode: Auditory Comprehension: 6-Follows complex conversation/direction: With extra time/assistive device  Expression Expression Mode: Verbal Expression: 5-Expresses complex 90% of the time/cues < 10%  of the time  Social Interaction Social Interaction: 4-Interacts appropriately 75 - 89% of the time - Needs redirection for appropriate language or to initiate interaction.  Problem Solving Problem Solving: 4-Solves basic 75 - 89% of the time/requires cueing 10 - 24% of the time  Memory Memory: 5-Recognizes or recalls 90% of the time/requires cueing < 10% of the time  Medical Problem List and Plan: 1. Functional deficits secondary to right cerebellar hemorrhage status post craniotomy evacuation of hematoma 02/05/2015 2. DVT Prophylaxis/Anticoagulation: Subcutaneous heparin initiated 02/06/2015. Monitor for any bleeding episodes 3. Pain Management: Tylenol as needed 4. A. fib with RVR. Continue Cardizem 60 mg 4 times a day, Lopressor 50 mg 3 times a day. Cardiac rate controlled. 5. Neuropsych: This patient is capable of making decisions on her own behalf. 6. Skin/Wound Care: Routine skin checks 7. Fluids/Electrolytes/Nutrition: Routine I/O with follow-up  chemistries 8. Diastolic congestive heart failure. No signs of fluid overload. Follow-up per cardiology services 9. COPD. Nebulizers as directed. No shortness of breath reported 10. Recurrent bladder papillary carcinoma as well as history of breast cancer with mastectomy. 11. Hyperlipidemia. Lipitor 12. Constipation. Laxed assistance.  LOS (Days) 2 A FACE TO FACE EVALUATION WAS PERFORMED  Jeffrey Graefe E 02/16/2015, 7:34 AM

## 2015-02-16 NOTE — Progress Notes (Signed)
Occupational Therapy Session Note  Patient Details  Name: [name] MRN: [id number] Date of Birth: 30-Dec-1934  Today's Date: 02/16/2015 OT Individual Time: 0930-1030 OT Individual Time Calculation (min): 60 min    Short Term Goals: Week 1:  OT Short Term Goal 1 (Week 1): Pt will complete bathing with min assist at sit > stand level OT Short Term Goal 2 (Week 1): Pt will complete LB dressing with mod assist at sit > stand level OT Short Term Goal 3 (Week 1): Pt will complete toilet transfers with min assist OT Short Term Goal 4 (Week 1): Pt will complete walk-in shower transfers with min assist OT Short Term Goal 5 (Week 1): Pt will complete 2 grooming tasks in standing with steady assist for standing balance  Skilled Therapeutic Interventions/Progress Updates:    Engaged in ADL retraining with focus on functional transfers, sit <> stand, standing balance, and functional use of RUE.  Pt in bed upon arrival requiring increased time to arouse enough to participate.  Ambulated approx 8 feet in room to w/c with focus on weight shift to Lt to take appropriate step with RLE as pt tends to push to Rt in upright standing which increases with ambulation.  Pt's daughter on Rt stabilizing RW as therapist provided manual facilitation for weight shift.  Performed squat pivot transfer w/c > tub bench in room shower with mod assist with use of grab bars for forward weight shift.  Pt demonstrating improved sitting balance on tub bench in shower.  Use of RUE with washing hair and managing hand held shower head with decrease in ataxia when visually attending to task.  Dressing completed at sit > stand level at sink with pt able to recall hemi-dressing technique but unable to thread pants this session due to fatigue.  Pt required increased assist with all dressing tasks this session due to fatigue from prior PT session.  Pt reports needing to toilet.  Performed squat pivot transfer w/c > toilet with max assist  secondary to Rt lateropulsion and RLE withdrawal during transfer.  Pt left on toilet with nurse tech present.  Therapy Documentation Precautions:  Precautions Precautions: Fall Precaution Comments: truncal ataxia Restrictions Weight Bearing Restrictions: No General:   Vital Signs: Therapy Vitals Pulse Rate: 68 BP: 126/73 mmHg Pain:  Pt with no c/o pain  See FIM for current functional status  Therapy/Group: Individual Therapy  Simonne Come 02/16/2015, 11:12 AM

## 2015-02-16 NOTE — Progress Notes (Signed)
Speech Language Pathology Daily Session Note  Patient Details  Name: [name] MRN: [id number] Date of Birth: November 06, 1934  Today's Date: 02/16/2015 SLP Individual Time: 1401-1500 SLP Individual Time Calculation (min): 59 min  Short Term Goals: Week 1: SLP Short Term Goal 1 (Week 1): Pt will recognize and correct errors in the moment during semi-complex self care and/or home management tasks wtih supervision.  SLP Short Term Goal 2 (Week 1): Pt will complete semi-copmlex self care and/or home management tasks with supervision for problem solving.   SLP Short Term Goal 3 (Week 1): Pt will anticipate at least 2 ways in which her current deficits will impact her ability to complete self care/home management tasks safely and will generate appropriate solutions to problems with supervision.   Skilled Therapeutic Interventions:  Pt was seen for skilled ST targeting cognitive goals.  Upon arrival, pt was reclined in bed, awake, lethargic, but agreeable to participate in [place].  SLP facilitated the session with a medication management task targeting functional problem solving for home management tasks.  Pt required mod assist verbal cues to recall function of newly scheduled medications; however she was supervision-mod I for recall of function of previously known medications.  Pt loaded pills into a pill box with the use of a written list and min verbal cues for planning, organization, and error awareness.  SLP recommended that pt have at least initial assist for medication and financial management at discharge.  Upon completion of task, pt requested to use the restroom.  During transfers, pt required overall min-mod assist verbal cues for safety due to impulsivity.  Pt's daughters were present and were in agreement with the abovementioned recommendations.  Continue per current plan of care.    FIM:  Comprehension Comprehension Mode: Auditory Comprehension: 6-Follows complex conversation/direction:  With extra time/assistive device Expression Expression Mode: Verbal Expression: 5-Expresses complex 90% of the time/cues < 10% of the time Social Interaction Social Interaction: 4-Interacts appropriately 75 - 89% of the time - Needs redirection for appropriate language or to initiate interaction. Problem Solving Problem Solving: 4-Solves basic 75 - 89% of the time/requires cueing 10 - 24% of the time Memory Memory: 5-Recognizes or recalls 90% of the time/requires cueing < 10% of the time  Pain Pain Assessment Pain Assessment: No/denies pain   Therapy/Group: Individual Therapy  Amaiah Cristiano, Selinda Orion 02/16/2015, 4:50 PM

## 2015-02-16 NOTE — Patient Care Conference (Signed)
Inpatient RehabilitationTeam Conference and Plan of Care Update Date: 02/16/2015   Time: 11;20 AM    Patient Name: [name]      Medical Record Number: [id number]  Date of Birth: 12/02/1934 Sex: Female         Room/Bed: 4M03C/4M03C-01 Payor Info: Payor: MEDICARE / Plan: MEDICARE PART A AND B / Product Type: *No Product type* /    Admitting Diagnosis: R cerebellar hemorrhage   Admit Date/Time:  02/14/2015  5:06 PM Admission Comments: No comment available   Primary Diagnosis:  <principal problem not specified> Principal Problem: <principal problem not specified>  Patient Active Problem List   Diagnosis Date Noted  . 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 Dizziness   . Cytotoxic cerebral edema   . Acute cerebellar hemorrhage   . Hypokalemia   . Chronic combined systolic and diastolic congestive heart failure   . Atrial fibrillation with RVR   . HLD (hyperlipidemia)   . Obstructive hydrocephalus   . Cerebellar hemorrhage 02/05/2015  . COPD bronchitis 12/30/2014  . GERD (gastroesophageal reflux disease) 12/30/2014  . Bladder cancer 01/01/2014  . CHF (congestive heart failure) 01/01/2014  . Glaucoma 01/01/2014  . 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 Alysia Penna Social Worker Present: Ovidio Kin, LCSW Nurse Present: Heather Roberts, RN PT Present: Raylene Everts, PT;Caroline Lacinda Axon, PT;Other (comment) Rudene Christians Tygielski-PT) OT Present: Meriel Pica, Jules Schick, OT SLP Present: Windell Moulding, SLP PPS Coordinator present : Daiva Nakayama, RN, CRRN     Current Status/Progress Goal Weekly Team Focus  Medical   freq urination , dark urine, very fatigued today, poor appetite  home d/c with family support  initiate therapy, improve appetite   Bowel/Bladder   Continent of bowel and bladder. LBM 02/12/15. Pt refusing colace and Sorbitol   Managed bowel   BM q 2-3 days. Continue to educate regarding need for laxative   Swallow/Nutrition/ Hydration     na        ADL's   mod assist transfers, mod assist bathing, min assist UB dressing, max assist LB dressing  supervision overall  dynamic sitting and standing balance, transfers, RUE NMR   Mobility   mod/maxA for transfers, stairs, gait with +2 HHA or RW  supervision overall, minA stairs  Standing balance, gait, coordination   Communication     na        Safety/Cognition/ Behavioral Observations  min assist for semi-complex, decreased functional problem solving and emergent/anticipatory awareness of deficits   supervision   education and carryover of compensatory strategies    Pain   No c/o pain  <3  Monitor for nonverbal cues of pain   Skin   L elbow pink, Suboccipital crani with dermabond.-healing appropriately  No additional skin breakdown  Encourage to turn q 2hrs      *See Care Plan and progress notes for long and short-term goals.  Barriers to Discharge: pulsion to Right, fatigue    Possible Resolutions to Barriers:  vestibular eval    Discharge Planning/Teaching Needs:  Home with husband and son, daughter's to be assisting.  Aware will need 24 hr care at discharge      Team Discussion:  Goals-overall supervision level-min-stairs.  Needs to space out therapies-due to deconditioned. Posterior lean to right. Vestibular eval-PT. Check UA, constipated working on this. Supportive family-here observing  Revisions to Treatment Plan:  None  Continued Need for Acute Rehabilitation Level of Care: The patient requires daily medical management by a physician with specialized training in physical medicine and rehabilitation for the following conditions: Daily direction of a multidisciplinary physical rehabilitation program to ensure safe treatment while eliciting the highest outcome that is of practical value to the patient.: Yes Daily medical management of patient  stability for increased activity during participation in an intensive rehabilitation regime.: Yes Daily analysis of laboratory values and/or radiology reports with any subsequent need for medication adjustment of medical intervention for : Neurological problems;Other;Post surgical problems  Elease Hashimoto 02/16/2015, 1:28 PM

## 2015-02-16 NOTE — Telephone Encounter (Signed)
Pt was on tcm list was d/c 02/14/15 Cerebellar Hemorrhage. Pt was sent to rehab facility...[name]

## 2015-02-16 NOTE — Progress Notes (Signed)
Social Work Elease Hashimoto, LCSW Social Worker Signed  Patient Care Conference 02/16/2015  1:28 PM    Expand All Collapse All   Inpatient RehabilitationTeam Conference and Plan of Care Update Date: 02/16/2015   Time: 11;20 AM     Patient Name: [name]       Medical Record Number: [id number]  Date of Birth: 1935-06-07 Sex: Female         Room/Bed: 4M03C/4M03C-01 Payor Info: Payor: MEDICARE / Plan: MEDICARE PART A AND B / Product Type: *No Product type* /    Admitting Diagnosis: R cerebellar hemorrhage   Admit Date/Time:  02/14/2015  5:06 PM Admission Comments: No comment available   Primary Diagnosis:  <principal problem not specified> Principal Problem: <principal problem not specified>    Patient Active Problem List     Diagnosis  Date Noted   .  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  Dizziness     .  Cytotoxic cerebral edema     .  Acute cerebellar hemorrhage     .  Hypokalemia     .  Chronic combined systolic and diastolic congestive heart failure     .  Atrial fibrillation with RVR     .  HLD (hyperlipidemia)     .  Obstructive hydrocephalus     .  Cerebellar hemorrhage  02/05/2015   .  COPD bronchitis  12/30/2014   .  GERD (gastroesophageal reflux disease)  12/30/2014   .  Bladder cancer  01/01/2014   .  CHF (congestive heart failure)  01/01/2014   .  Glaucoma  01/01/2014   .  DCIS (ductal carcinoma in situ) of breast  01/01/2014     Expected Discharge Date: Expected Discharge Date: 03/02/15  Team Members Present: Physician leading conference: Dr. Alysia Penna Social Worker Present: Ovidio Kin, LCSW Nurse Present: Heather Roberts, RN PT Present: Raylene Everts, PT;Caroline Lacinda Axon, PT;Other (comment) Rudene Christians Tygielski-PT) OT Present: Meriel Pica, Jules Schick, OT SLP Present: Windell Moulding, SLP PPS Coordinator present : Daiva Nakayama, RN, CRRN        Current Status/Progress  Goal  Weekly Team Focus   Medical     freq urination , dark urine, very  fatigued today, poor appetite   home d/c with family support  initiate therapy, improve appetite    Bowel/Bladder     Continent of bowel and bladder. LBM 02/12/15. Pt refusing colace and Sorbitol  Managed bowel   BM q 2-3 days. Continue to educate regarding need for laxative    Swallow/Nutrition/ Hydration       na         ADL's     mod assist transfers, mod assist bathing, min assist UB dressing, max assist LB dressing  supervision overall  dynamic sitting and standing balance, transfers, RUE NMR   Mobility     mod/maxA for transfers, stairs, gait with +2 HHA or RW  supervision overall, minA stairs  Standing balance, gait, coordination    Communication       na         Safety/Cognition/ Behavioral Observations    min assist for semi-complex, decreased functional problem solving and emergent/anticipatory awareness of deficits    supervision   education and carryover of compensatory strategies    Pain     No c/o pain  <3  Monitor for nonverbal cues of pain    Skin     L elbow pink, Suboccipital crani with dermabond.-healing  appropriately  No additional skin breakdown  Encourage to turn q 2hrs      *See Care Plan and progress notes for long and short-term goals.    Barriers to Discharge:  pulsion to Right, fatigue     Possible Resolutions to Barriers:   vestibular eval     Discharge Planning/Teaching Needs:   Home with husband and son, daughter's to be assisting.  Aware will need 24 hr care at discharge       Team Discussion:    Goals-overall supervision level-min-stairs.  Needs to space out therapies-due to deconditioned. Posterior lean to right. Vestibular eval-PT. Check UA, constipated working on this. Supportive family-here observing   Revisions to Treatment Plan:    None    Continued Need for Acute Rehabilitation Level of Care: The patient requires daily medical management by a physician with specialized training in physical medicine and rehabilitation for the following  conditions: Daily direction of a multidisciplinary physical rehabilitation program to ensure safe treatment while eliciting the highest outcome that is of practical value to the patient.: Yes Daily medical management of patient stability for increased activity during participation in an intensive rehabilitation regime.: Yes Daily analysis of laboratory values and/or radiology reports with any subsequent need for medication adjustment of medical intervention for : Neurological problems;Other;Post surgical problems  Elease Hashimoto 02/16/2015, 1:28 PM                  Patient ID: [name], female   DOB: 1934/11/05, 80 y.o.   MRN: [id number]

## 2015-02-16 NOTE — Progress Notes (Signed)
Social Work Patient ID: [name], female   DOB: 07/02/1935, 80 y.o.   MRN: [id number] Met with pt and daughter to inform of team conference goals-supervision-min level and target discharge 6/29. Both are pleased with how well she is doing. Would like her therapies spread out due to how exhausted She is if therapies back to back. Aware working on bowels and checking UA.  Daughter wants to make sure she is eating and trying to order food pt will eat.  Continue to work on discharge plans. Family is always here With pt.

## 2015-02-16 NOTE — Progress Notes (Signed)
Physical Therapy Session Note  Patient Details  Name: [name] MRN: [id number] Date of Birth: 10/11/1934  Today's Date: 02/16/2015 PT Individual Time: 0800-0855 PT Individual Time Calculation (min): 55 min   Short Term Goals: Week 1:  PT Short Term Goal 1 (Week 1): Pt to perform bed mobility with supevision PT Short Term Goal 2 (Week 1): Pt to perform squat pivot transfer bed <> w/c minA PT Short Term Goal 3 (Week 1): Pt to demonstrate w/c propulsion 100 ft supervision PT Short Term Goal 4 (Week 1): Pt to ambulate 75 ft with LRAD and modA +1 PT Short Term Goal 5 (Week 1): Pt to perform ascent/descent of 6 3-inch stairs with modA +1  Skilled Therapeutic Interventions/Progress Updates:    Pt received supine in bed with two daughters present; reports no pain and agreeable to treatment. Pt instructed in stand pivot transfer with maxA from bed <> w/c at beginning and end of treatment. Pt instructed in standing balance NMR with BUE reaching task. During initial standing pt demonstrates standing balance with minA/CGA, however with cognitive dual task and increased time pt demonstrates inc R lateropulsion and requires mod/maxA to maintain balance and no evidence of self-correction. Pt requires several rest breaks both due to fatigue as well as deterioration of standing balance quality. Pt instructed in gait training in hallway with RW and maxA +2 with verbal/tactile cues for L weight shifting to A in R foot clearance and progression. Pt again demonstrates improved quality and less assist initially, with increased cueing and reduced L weight shift with increased time. Pt instructed in w/c propulsion with BLEs to improve LE strength and coordination for carryover into gait and transfers. Pt returned to room and transferred bed >w/c with maxA and performs sit >supine with supervision; modA for scooting to [facility]. Pt left supine in bed with all needs within reach and nursing staff as well as family  present.   Therapy Documentation Precautions:  Precautions Precautions: Fall Precaution Comments: truncal ataxia Restrictions Weight Bearing Restrictions: No Pain: Pain Assessment Pain Assessment: No/denies pain Pain Score: 0-No pain Locomotion : Ambulation Ambulation/Gait Assistance: 2: Max assist;1: +2 Total assist Wheelchair Mobility Distance: 40   See FIM for current functional status  Therapy/Group: Individual Therapy  Luberta Mutter 02/16/2015, 12:14 PM

## 2015-02-16 NOTE — Progress Notes (Signed)
Occupational Therapy Session Note  Patient Details  Name: [name] MRN: [id number] Date of Birth: [birth year]  Today's Date: 02/16/2015 OT Individual Time: 1500-1531 OT Individual Time Calculation (min): 31 min    Skilled Therapeutic Interventions/Progress Updates:    Pt performed neuromuscular re-education during session.  She rolled to the gym via wheelchair and transferred to the therapy mat with mod assist stand pivot.  Increased posterior lean in sitting initially, but she was able to correct with cueing.  Worked on forward trunk flexion and reaching for target with the RUE to work on dynamic sitting balance.  She was able to reach for therapist's hand and then return to midline with min guard assist.  Progressed to standing as well.  Pt with increased lean/pushing to the right side in standing.  Mod facilitation needed at the right pelvis to to correct standing alignment.  Progressed to taking steps with both the right and left LEs.  Increased ataxia and decrease right knee and hip control noted.  Pt ambulated from mat to wheelchair with total assist +2 (pt 40%).  Therapist providing mod assist to advance the RLE as she tends to adduct during the stepping as well as providing for left hip and left knee control.  Finished session with transfer stand pivot to the bed.  Pt with increased pain in the right rib cage and hip as therapist attempted to facilitate greater weightshift to the left for advancement of the RLE.  Pt left in bed with daughters present and call button within reach.        Therapy Documentation Precautions:  Precautions Precautions: Fall Precaution Comments: truncal ataxia, LUE ataxia Restrictions Weight Bearing Restrictions: No  Pain: Pain Assessment Pain Assessment: Faces Pain Score: 0-No pain Faces Pain Scale: Hurts little more Pain Type: Acute pain Pain Location: Rib cage Pain Orientation: Right Pain Intervention(s): Repositioned ADL: See FIM for  current functional status  Therapy/Group: Individual Therapy  Bryli Mantey OTR/L 02/16/2015, 3:40 PM

## 2015-02-17 ENCOUNTER — Inpatient Hospital Stay (HOSPITAL_COMMUNITY): Payer: Medicare Other | Admitting: Speech Pathology

## 2015-02-17 ENCOUNTER — Inpatient Hospital Stay (HOSPITAL_COMMUNITY): Payer: Medicare Other | Admitting: Occupational Therapy

## 2015-02-17 ENCOUNTER — Inpatient Hospital Stay (HOSPITAL_COMMUNITY): Payer: Medicare Other | Admitting: Physical Therapy

## 2015-02-17 MED ORDER — CEPHALEXIN 250 MG PO CAPS
250.0000 mg | ORAL_CAPSULE | Freq: Three times a day (TID) | ORAL | Status: DC
  Administered 2015-02-17 – 2015-02-20 (×11): 250 mg via ORAL
  Filled 2015-02-17 (×16): qty 1

## 2015-02-17 NOTE — Progress Notes (Signed)
80 y.o. right handed female  CT of the head showed a 4 cm acute hemorrhage within the right cerebellar hemisphere with surrounding edema and mass effect on the fourth ventricle as well as lacunar infarct of the right caudate head appeared to be remote. Underwent right retrosigmoid suboccipital craniotomy, evacuation of hematoma 02/05/2015 per Dr. Kathyrn Sheriff. Hospital course blood pressure management. Subcutaneous heparin was later initiated for DVT prophylaxis 02/06/2015.   Subjective/Complaints: No burning with urination Takes tylenol for HA Intolerant of opioids + headache, some dizziness and double vision, no hiccups, n/v, + constipation, no joint or muscle pain Objective: Vital Signs: Blood pressure 132/85, pulse 75, temperature 98.7 F (37.1 C), temperature source Oral, resp. rate 20, weight 70.67 kg (155 lb 12.8 oz), last menstrual period 12/03/2002, SpO2 96 %. No results found. Results for orders placed or performed during the hospital encounter of 02/14/15 (from the past 72 hour(s))  CBC     Status: Abnormal   Collection Time: 02/14/15 10:00 PM  Result Value Ref Range   WBC 10.9 (H) 4.0 - 10.5 K/uL   RBC 3.86 (L) 3.87 - 5.11 MIL/uL   Hemoglobin 11.9 (L) 12.0 - 15.0 g/dL   HCT 36.1 36.0 - 46.0 %   MCV 93.5 78.0 - 100.0 fL   MCH 30.8 26.0 - 34.0 pg   MCHC 33.0 30.0 - 36.0 g/dL   RDW 14.2 11.5 - 15.5 %   Platelets 269 150 - 400 K/uL  Creatinine, serum     Status: None   Collection Time: 02/14/15 10:00 PM  Result Value Ref Range   Creatinine, Ser 0.85 0.44 - 1.00 mg/dL   GFR calc non Af Amer >60 >60 mL/min   GFR calc Af Amer >60 >60 mL/min    Comment: (NOTE) The eGFR has been calculated using the CKD EPI equation. This calculation has not been validated in all clinical situations. eGFR's persistently <60 mL/min signify possible Chronic Kidney Disease.   CBC WITH DIFFERENTIAL     Status: Abnormal   Collection Time: 02/15/15  4:55 AM  Result Value Ref Range   WBC 10.0 4.0 -  10.5 K/uL   RBC 4.07 3.87 - 5.11 MIL/uL   Hemoglobin 12.5 12.0 - 15.0 g/dL   HCT 38.2 36.0 - 46.0 %   MCV 93.9 78.0 - 100.0 fL   MCH 30.7 26.0 - 34.0 pg   MCHC 32.7 30.0 - 36.0 g/dL   RDW 14.4 11.5 - 15.5 %   Platelets 244 150 - 400 K/uL   Neutrophils Relative % 65 43 - 77 %   Neutro Abs 6.4 1.7 - 7.7 K/uL   Lymphocytes Relative 21 12 - 46 %   Lymphs Abs 2.1 0.7 - 4.0 K/uL   Monocytes Relative 12 3 - 12 %   Monocytes Absolute 1.2 (H) 0.1 - 1.0 K/uL   Eosinophils Relative 2 0 - 5 %   Eosinophils Absolute 0.2 0.0 - 0.7 K/uL   Basophils Relative 0 0 - 1 %   Basophils Absolute 0.0 0.0 - 0.1 K/uL  Comprehensive metabolic panel     Status: Abnormal   Collection Time: 02/15/15  4:55 AM  Result Value Ref Range   Sodium 139 135 - 145 mmol/L   Potassium 3.0 (L) 3.5 - 5.1 mmol/L   Chloride 96 (L) 101 - 111 mmol/L   CO2 34 (H) 22 - 32 mmol/L   Glucose, Bld 94 65 - 99 mg/dL   BUN 20 6 - 20 mg/dL   Creatinine,  Ser 0.79 0.44 - 1.00 mg/dL   Calcium 8.5 (L) 8.9 - 10.3 mg/dL   Total Protein 5.6 (L) 6.5 - 8.1 g/dL   Albumin 3.1 (L) 3.5 - 5.0 g/dL   AST 25 15 - 41 U/L   ALT 20 14 - 54 U/L   Alkaline Phosphatase 88 38 - 126 U/L   Total Bilirubin 1.0 0.3 - 1.2 mg/dL   GFR calc non Af Amer >60 >60 mL/min   GFR calc Af Amer >60 >60 mL/min    Comment: (NOTE) The eGFR has been calculated using the CKD EPI equation. This calculation has not been validated in all clinical situations. eGFR's persistently <60 mL/min signify possible Chronic Kidney Disease.    Anion gap 9 5 - 15  Urinalysis, Routine w reflex microscopic (not at [facility])     Status: Abnormal   Collection Time: 02/16/15  3:04 PM  Result Value Ref Range   Color, Urine AMBER (A) YELLOW    Comment: BIOCHEMICALS MAY BE AFFECTED BY COLOR   APPearance TURBID (A) CLEAR   Specific Gravity, Urine 1.025 1.005 - 1.030   pH 6.0 5.0 - 8.0   Glucose, UA NEGATIVE NEGATIVE mg/dL   Hgb urine dipstick MODERATE (A) NEGATIVE   Bilirubin Urine  NEGATIVE NEGATIVE   Ketones, ur 15 (A) NEGATIVE mg/dL   Protein, ur 30 (A) NEGATIVE mg/dL   Urobilinogen, UA 1.0 0.0 - 1.0 mg/dL   Nitrite POSITIVE (A) NEGATIVE   Leukocytes, UA LARGE (A) NEGATIVE  Urine microscopic-add on     Status: Abnormal   Collection Time: 02/16/15  3:04 PM  Result Value Ref Range   Squamous Epithelial / LPF RARE RARE   WBC, UA TOO NUMEROUS TO COUNT <3 WBC/hpf   RBC / HPF 0-2 <3 RBC/hpf   Bacteria, UA MANY (A) RARE     HEENT: normal Cardio: RRR and no murmur Resp: CTA B/L and unlabored GI: BS positive and NT, ND Extremity:  Pulses positive and No Edema Skin:   Wound C/D/I and ecchymotic right suboccipital  Neuro: Alert/Oriented, Normal Sensory, Normal Motor, Abnormal FMC  Right UE and LE Ataxic/ dec FMC and Other severe dysmetria FNF and HS,+horz nystagmus, EOMI Musc/Skel:  Normal Gen NAD   Assessment/Plan: 1. 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 Physiatrist is providing close team supervision and 24 hour management of active medical problems listed below. Physiatrist and rehab team continue to assess barriers to discharge/monitor patient progress toward functional and medical goals.  FIM: FIM - Bathing Bathing Steps Patient Completed: Chest, Abdomen, Front perineal area, Buttocks, Right upper leg, Left upper leg Bathing: 3: Mod-Patient completes 5-7 34f10 parts or 50-74%  FIM - Upper Body Dressing/Undressing Upper body dressing/undressing steps patient completed: Thread/unthread right bra strap, Thread/unthread left bra strap, Thread/unthread right sleeve of front closure shirt/dress, Thread/unthread left sleeve of front closure shirt/dress Upper body dressing/undressing: 3: Mod-Patient completed 50-74% of tasks FIM - Lower Body Dressing/Undressing Lower body dressing/undressing steps patient completed: Pull underwear up/down, Pull  pants up/down Lower body dressing/undressing: 2: Max-Patient completed 25-49% of tasks  FIM - Toileting Toileting steps completed by patient: Adjust clothing prior to toileting, Adjust clothing after toileting Toileting Assistive Devices: Grab bar or rail for support Toileting: 3: Mod-Patient completed 2 of 3 steps  FIM - TRadio producerDevices: Grab bars Toilet Transfers: 2-To toilet/BSC: Max A (lift and lower assist)  FIM -  Bed/Chair Financial planner Devices: Arm rests Bed/Chair Transfer: 4: Sit > Supine: Min A (steadying pt. > 75%/lift 1 leg), 3: Chair or W/C > Bed: Mod A (lift or lower assist)  FIM - Locomotion: Wheelchair Distance: 40 Locomotion: Wheelchair: 1: Travels less than 50 ft with supervision, cueing or coaxing FIM - Locomotion: Ambulation Locomotion: Ambulation Assistive Devices: Administrator Ambulation/Gait Assistance: 2: Max assist, 1: +2 Total assist Locomotion: Ambulation: 1: Two helpers  Comprehension Comprehension Mode: Auditory Comprehension: 6-Follows complex conversation/direction: With extra time/assistive device  Expression Expression Mode: Verbal Expression: 5-Expresses complex 90% of the time/cues < 10% of the time  Social Interaction Social Interaction: 4-Interacts appropriately 75 - 89% of the time - Needs redirection for appropriate language or to initiate interaction.  Problem Solving Problem Solving: 4-Solves basic 75 - 89% of the time/requires cueing 10 - 24% of the time  Memory Memory: 5-Recognizes or recalls 90% of the time/requires cueing < 10% of the time  Medical Problem List and Plan: 1. Functional deficits secondary to right cerebellar hemorrhage status post craniotomy evacuation of hematoma 02/05/2015 2. DVT Prophylaxis/Anticoagulation: Subcutaneous heparin initiated 02/06/2015. Monitor for any bleeding episodes 3. Pain Management: Tylenol as needed 4. A. fib with RVR.  Continue Cardizem 60 mg 4 times a day, Lopressor 50 mg 3 times a day. Cardiac rate controlled. 5. Neuropsych: This patient is capable of making decisions on her own behalf. 6. Skin/Wound Care: Routine skin checks 7. Fluids/Electrolytes/Nutrition: Routine I/O with follow-up chemistries, hypoK with KCL supplement 8. Diastolic congestive heart failure. No signs of fluid overload. Follow-up per cardiology services 9. COPD. Nebulizers as directed. No shortness of breath reported 10. Recurrent bladder papillary carcinoma as well as history of breast cancer with mastectomy. 11. Hyperlipidemia. Lipitor 12. Constipation. Laxed assistance. 13.  UTI empiric cephalexin until; C and S back LOS (Days) 3 A FACE TO FACE EVALUATION WAS PERFORMED  Alwaleed Obeso E 02/17/2015, 6:56 AM

## 2015-02-17 NOTE — Progress Notes (Signed)
Speech Language Pathology Daily Session Note  Patient Details  Name: [name] MRN: [id number] Date of Birth: 1935-07-14  Today's Date: 02/17/2015 SLP Individual Time: 1683-8706 SLP Individual Time Calculation (min): 30 min  Short Term Goals: Week 1: SLP Short Term Goal 1 (Week 1): Pt will recognize and correct errors in the moment during semi-complex self care and/or home management tasks wtih supervision.  SLP Short Term Goal 2 (Week 1): Pt will complete semi-copmlex self care and/or home management tasks with supervision for problem solving.   SLP Short Term Goal 3 (Week 1): Pt will anticipate at least 2 ways in which her current deficits will impact her ability to complete self care/home management tasks safely and will generate appropriate solutions to problems with supervision.   Skilled Therapeutic Interventions: Skilled treatment session focused on cognitive goals. SLP facilitated session by providing extra time and Min A verbal and visual cues for functional problem solving during a basic money management task, suspect function impacted by visual impairments. Patient requested to go back to bed at end of session and required Mod A verbal cues for safety and sequencing with task. Patient left supine in bed with daughters present. Continue with current plan of care.    FIM:  Comprehension Comprehension Mode: Auditory Comprehension: 5-Understands basic 90% of the time/requires cueing < 10% of the time Expression Expression Mode: Verbal Expression: 5-Expresses basic 90% of the time/requires cueing < 10% of the time. Social Interaction Social Interaction: 4-Interacts appropriately 75 - 89% of the time - Needs redirection for appropriate language or to initiate interaction. Problem Solving Problem Solving: 4-Solves basic 75 - 89% of the time/requires cueing 10 - 24% of the time Memory Memory: 5-Recognizes or recalls 90% of the time/requires cueing < 10% of the  time  Pain Pain Assessment Pain Assessment: No/denies pain Pain Score: 0-No pain  Therapy/Group: Individual Therapy  Travontae Freiberger 02/17/2015, 4:30 PM

## 2015-02-17 NOTE — Progress Notes (Signed)
Physical Therapy Session Note  Patient Details  Name: [name] MRN: [id number] Date of Birth: [birth year]  Today's Date: 02/17/2015 PT Individual Time:  -      Short Term Goals: Week 1:  PT Short Term Goal 1 (Week 1): Pt to perform bed mobility with supevision PT Short Term Goal 2 (Week 1): Pt to perform squat pivot transfer bed <> w/c minA PT Short Term Goal 3 (Week 1): Pt to demonstrate w/c propulsion 100 ft supervision PT Short Term Goal 4 (Week 1): Pt to ambulate 75 ft with LRAD and modA +1 PT Short Term Goal 5 (Week 1): Pt to perform ascent/descent of 6 3-inch stairs with modA +1  Skilled Therapeutic Interventions/Progress Updates:    Pt received supine in bed with daughter present; no c/o pain and agreeable to treatment. Pt and family educated on motion sensitivity test used to identify provocative positions/movements and habituate brain to reduce discomfort. Motion sensitivity quotient test performed with total score 53.9 indicating severe involvement. Positions of greatest discomfort included seated forward trunk flexion, vertical and horizontal head turns, and standing with 180 degree turn. Pt educated on role and continued performance of position changes integrated into future treatment sessions to A with reducing discomfort. Pt ambulated from bed to toilet approx 10 ft with maxA +2 due to strong R lateropulsion increased due to urgency, and poor weightshifting/foot clearance requiring mod/max cues and manual facilitation for safety. Pt performs sitting balance and personal hygiene independently. At completion of session, pt returned to bed and remained supine with family present and all needs within reach.   Therapy Documentation Precautions:  Precautions Precautions: Fall Precaution Comments: truncal ataxia, LUE ataxia Restrictions Weight Bearing Restrictions: No Pain: Pain Assessment Pain Assessment: No/denies pain Pain Score: 0-No pain   See FIM for current  functional status  Therapy/Group: Individual Therapy  Luberta Mutter 02/17/2015, 3:13 PM

## 2015-02-17 NOTE — IPOC Note (Signed)
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     Functional Problem List: Nursing Bladder, Bowel, Endurance, Medication Management, Motor, Pain, Safety, Skin Integrity  PT Balance, Endurance, Motor, Perception, Safety, Other (comment) (vestibular impairment)  OT Balance, Motor, Perception, Safety, Vision  SLP Cognition  TR         Basic ADL's: OT Eating, Grooming, Bathing, Dressing, Toileting     Advanced  ADL's: OT Simple Meal Preparation, Laundry     Transfers: PT Bed Mobility, Bed to Chair, Car, Manufacturing systems engineer, Metallurgist: PT Ambulation, Stairs, Emergency planning/management officer     Additional Impairments: OT Fuctional Use of Upper Extremity  SLP Social Cognition   Problem Solving, Awareness  TR      Anticipated Outcomes Item Anticipated Outcome  Self Feeding Supervision/setup  Swallowing      Basic self-care  Supervision  Toileting  Supervision   Bathroom Transfers Supervision  Bowel/Bladder  Bowel and bladder with min. assist  Transfers  supervision  Locomotion  supervision  Communication     Cognition  Supervision   Pain  Pain level 3 or less on a scale of 0-10.  Safety/Judgment  Safety/judgement with min assist.   Therapy Plan: PT Intensity: Minimum of 1-2 x/day ,45 to 90 minutes PT Frequency: 5 out of 7 days PT Duration Estimated Length of Stay: 14-16 days OT Intensity: Minimum of 1-2 x/day, 45 to 90 minutes OT Frequency: 5 out of 7 days OT Duration/Estimated Length of Stay: 14-16 days SLP Intensity: Minumum of 1-2 x/day, 30 to 90 minutes SLP Frequency: 3 to 5 out of 7 days SLP Duration/Estimated Length of Stay:  14-16 days       Team Interventions: Nursing Interventions Patient/Family Education, Bladder Management, Bowel Management, Disease Management/Prevention, Pain Management, Medication Management, Skin Care/Wound Management, Discharge Planning, Psychosocial Support  PT interventions Ambulation/gait training, Training and development officer, DME/adaptive equipment instruction, Discharge planning, Functional mobility training, Therapeutic Activities, Therapeutic Exercise, Wheelchair propulsion/positioning, UE/LE Strength taining/ROM, UE/LE Coordination activities, Stair training, Patient/family education, Neuromuscular re-education, Community reintegration  OT Interventions Training and development officer, Discharge planning, Disease mangement/prevention, DME/adaptive equipment instruction, Functional mobility training, Neuromuscular re-education, Pain management, Patient/family education, Psychosocial support, Self Care/advanced ADL retraining, Therapeutic Activities, Therapeutic Exercise, UE/LE Strength taining/ROM, UE/LE Coordination activities, Visual/perceptual remediation/compensation  SLP Interventions Cognitive remediation/compensation, English as a second language teacher, Patient/family education, Internal/external aids, Environmental controls  TR Interventions    SW/CM Interventions Discharge Planning, Psychosocial Support, Patient/Family Education    Team Discharge Planning: Destination: PT-Home ,OT- Home , SLP-Home Projected Follow-up: PT-24 hour supervision/assistance, Outpatient PT, OT-  Outpatient OT, 24 hour supervision/assistance, SLP-Other (comment) (TBD) Projected Equipment Needs: PT-To be determined, OT- Tub/shower seat, To be determined, SLP-None recommended by SLP Equipment Details: PT- , OT-  Patient/family involved in discharge planning: PT- Family member/caregiver, Patient,  OT-Patient, Family member/caregiver, SLP-Family member/caregiver, Patient  MD ELOS: 13-15d Medical Rehab Prognosis:   Good Assessment: 80 y.o. right handed female with history of hypertension, transient atrial fibrillation maintained on aspirin, diastolic congestive heart failure, COPD, recurrent bladder papillary carcinoma as well as breast cancer with mastectomy. Patient lives with husband and independent prior to admission. Presented 02/05/2015 with dizziness and nausea/vomiting as well as bouts of blurred vision. Denies  any recent fall or head trauma. CT of the head showed a 4 cm acute hemorrhage within the right cerebellar hemisphere with surrounding edema and mass effect on the fourth ventricle as well as lacunar infarct of the right caudate head appeared to be remote.    Now requiring 24/7 Rehab RN,MD, as well as CIR level PT, OT and SLP.  Treatment team will focus on ADLs and mobility with goals set at Supervision  See Team Conference Notes for weekly updates to the plan of care

## 2015-02-17 NOTE — Progress Notes (Signed)
Occupational Therapy Session Note  Patient Details  Name: [name] MRN: [id number] Date of Birth: [birth year]  Today's Date: 02/17/2015 OT Individual Time: 1000-1100 and 1300-1400 OT Individual Time Calculation (min): 60 min and 60 min   Short Term Goals: Week 1:  OT Short Term Goal 1 (Week 1): Pt will complete bathing with min assist at sit > stand level OT Short Term Goal 2 (Week 1): Pt will complete LB dressing with mod assist at sit > stand level OT Short Term Goal 3 (Week 1): Pt will complete toilet transfers with min assist OT Short Term Goal 4 (Week 1): Pt will complete walk-in shower transfers with min assist OT Short Term Goal 5 (Week 1): Pt will complete 2 grooming tasks in standing with steady assist for standing balance  Skilled Therapeutic Interventions/Progress Updates:    1) Engaged in ADL retraining with focus on sit <> stand, sitting balance, standing balance, and functional use of RUE.  Pt in bed upon arrival, performed squat pivot bed > w/c with max assist requiring tactile cues and manual facilitation for forward weight shift as pt pushed backwards and hips forward during transfer.  Bathing completed at sit > stand level at sink with focus on upright standing balance, pt requiring tactile cues for forward weight shift and trunk control with sit <> stand and standing balance.  Mod verbal cues to increase participation prior to requesting assistance with fastening bra and buttoning shirt.  Pt fearful of falling and refused to attempt to thread pant legs this session.  In therapy gym, engaged in transfer transfer with focus on anterior weight shift with squat pivots.  On therapy mat completed lateral scoots down length of mat for repetition of weight shifting as needed for transfers and sit > stand.  Progressed to sit > stand with use of mirror to provide visual feedback for upright standing balance, therapist with hands on scapula and pelvis and with pt's Rt hand on  therapist's shoulder to promote upright standing posture.  Pt with improved trunk control with repetition.  Transferred w/c > toilet with max assist with cues for foot placement and weight shift.  Pt left on toilet with RN present.  2) Treatment session with focus on NMR with sitting balance, standing balance, and functional use of RUE.  Squat pivot transfer bed > w/c with mod assist with pt demonstrating carryover of transfer training during previous session with improved anterior weight shift during transfer.  In therapy gym engaged in dynamic sitting balance with reaching outside BOS with RUE, encouraged pt to visually attend to RUE to promote increased motor control with reaching.  Pt required cues to keep eyes open during session, pt with double vision that she tends to compensate for with closing Rt eye.  Utilized mirror placed in front of pt for visual input for sit > stand and therapist in front of pt to promote forward weight shift.  Pt very reliant on therapist with pushing down through therapist's shoulder for sit > stand, posterior lean when therapist moved to pt's side to decrease pushing on therapist.  Henri Medal walker to allow pt to have UE support during sit > stand and standing while allowing therapist to facilitate weight shift from behind and trunk control in standing while still using mirror.  Ambulated 3 musketeer style to promote upright standing balance with therapists facilitating weight shift at hips to allow for safe, appropriate step length and control.  When upright, therapist able to facilitate weight shift  to Lt and minimize pushing to Rt with min tactile cues and verbal cue.  Pt reports fatigue and requesting to return to room.  9 hole peg test completed with Lt: 1:14 and Rt: 1:58.  Diplopia and RUE dysmetria challenging 9 hole peg test.  Therapy Documentation Precautions:  Precautions Precautions: Fall Precaution Comments: truncal ataxia, LUE ataxia Restrictions Weight  Bearing Restrictions: No General:   Vital Signs: Therapy Vitals Pulse Rate: 85 Resp: 20 BP: (!) 90/53 mmHg Patient Position (if appropriate): Lying Oxygen Therapy SpO2: 95 % Pain:  Pt with no c/o pain  See FIM for current functional status  Therapy/Group: Individual Therapy  Simonne Come 02/17/2015, 12:17 PM

## 2015-02-18 ENCOUNTER — Inpatient Hospital Stay (HOSPITAL_COMMUNITY): Payer: Medicare Other | Admitting: Occupational Therapy

## 2015-02-18 ENCOUNTER — Inpatient Hospital Stay (HOSPITAL_COMMUNITY): Payer: Medicare Other | Admitting: Speech Pathology

## 2015-02-18 ENCOUNTER — Inpatient Hospital Stay (HOSPITAL_COMMUNITY): Payer: Medicare Other | Admitting: Physical Therapy

## 2015-02-18 NOTE — Patient Instructions (Signed)
Strengthening: Straight Leg Raise (Phase 1)   Tighten muscles on front of right thigh, then lift leg ____ inches from surface, keeping knee locked.  Repeat ____ times per set. Do ____ sets per session. Do ____ sessions per day.  http://orth.exer.us/614   Copyright  VHI. All rights reserved.  Strengthening: Hip Abductor - Resisted   With band looped around both legs above knees, push thighs apart. Repeat ____ times per set. Do ____ sets per session. Do ____ sessions per day.  http://orth.exer.us/688   Copyright  VHI. All rights reserved.  Bridging   Slowly raise buttocks from floor, keeping stomach tight. Repeat ____ times per set. Do ____ sets per session. Do ____ sessions per day.  http://orth.exer.us/1096   Copyright  VHI. All rights reserved.  Abdominals: Straight Leg Crunch   Lying on back with legs out straight and arms folded across chest, inhale. Then exhale while curling chin to chest and slowly curling trunk to sitting position. Slowly return to starting position. Repeat ____ times per set. Do ____ sets per session. Do ____ sessions per week.  Copyright  VHI. All rights reserved.  Adduction: Hip - Knees Together (Hook-Lying)   Lie with hips and knees bent, towel roll between knees. Push knees together. Hold for ___ seconds. Rest for ___ seconds. Repeat ___ times. Do ___ times a day.   Copyright  VHI. All rights reserved.

## 2015-02-18 NOTE — Progress Notes (Signed)
Occupational Therapy Session Note  Patient Details  Name: [name] MRN: [id number] Date of Birth: [birth year]  Today's Date: 02/18/2015 OT Individual Time: 5885-0277 OT Individual Time Calculation (min): 60 min    Short Term Goals: Week 1:  OT Short Term Goal 1 (Week 1): Pt will complete bathing with min assist at sit > stand level OT Short Term Goal 2 (Week 1): Pt will complete LB dressing with mod assist at sit > stand level OT Short Term Goal 3 (Week 1): Pt will complete toilet transfers with min assist OT Short Term Goal 4 (Week 1): Pt will complete walk-in shower transfers with min assist OT Short Term Goal 5 (Week 1): Pt will complete 2 grooming tasks in standing with steady assist for standing balance  Skilled Therapeutic Interventions/Progress Updates:    Engaged in ADL retraining with focus on functional transfers, sitting balance, sit <> stand, and increased participation with self-care tasks.  Pt in bed upon arrival reporting fatigue but willing to participate in treatment session.  Squat pivot bed > w/c> tub bench with increased sequencing and forward weight shift this session.  Completed bathing in room shower at sit > stand level with min assist for standing balance while washing buttocks and use of grab bar for steady assist.  Pt with improved motor control with RUE with incorporating into bathing task.  Dressing completed at sit > stand level at sink with use of mirror for visual input for midline standing.  Pt able to thread RLE through underwear and pants this session, but with short frustration tolerance requesting assistance after attempting LLE only one time.  Performed stand step transfers bed <> w/c with pt demonstrating increased trunk control with BUE placed on therapist's shoulders to promote upright standing.  Pt left in bed with daughters present and all needs in reach.  Therapy Documentation Precautions:  Precautions Precautions: Fall Precaution Comments:  truncal ataxia, LUE ataxia Restrictions Weight Bearing Restrictions: No General:   Vital Signs: Therapy Vitals Pulse Rate: (!) 102 Resp: 20 BP: 110/71 mmHg Patient Position (if appropriate): Lying Oxygen Therapy SpO2: 94 % O2 Device: Not Delivered Pain: Pain Assessment Pain Assessment: 0-10 Pain Score: 5  Pain Type: Acute pain Pain Location: Head Pain Orientation: Right;Left Pain Descriptors / Indicators: Headache Pain Onset: Gradual Pain Intervention(s): Medication (See eMAR)  See FIM for current functional status  Therapy/Group: Individual Therapy  Simonne Come 02/18/2015, 9:46 AM

## 2015-02-18 NOTE — Progress Notes (Signed)
80 y.o. right handed female  CT of the head showed a 4 cm acute hemorrhage within the right cerebellar hemisphere with surrounding edema and mass effect on the fourth ventricle as well as lacunar infarct of the right caudate head appeared to be remote. Underwent right retrosigmoid suboccipital craniotomy, evacuation of hematoma 02/05/2015 per Dr. Kathyrn Sheriff.  Subjective/Complaints: No burning with urination, discussed UTI and that sensitivity is still pending, no fevers on Keflex  + headache, some dizziness and double vision, no hiccups, n/v, + constipation, no joint or muscle pain Objective: Vital Signs: Blood pressure 125/86, pulse 92, temperature 98.2 F (36.8 C), temperature source Oral, resp. rate 18, weight 70.67 kg (155 lb 12.8 oz), last menstrual period 12/03/2002, SpO2 95 %. No results found. Results for orders placed or performed during the hospital encounter of 02/14/15 (from the past 72 hour(s))  Urine culture     Status: None (Preliminary result)   Collection Time: 02/16/15  3:04 PM  Result Value Ref Range   Specimen Description URINE, RANDOM    Special Requests NONE    Culture >100000 ESCHERICHIA COLI    Report Status PENDING   Urinalysis, Routine w reflex microscopic (not at [facility])     Status: Abnormal   Collection Time: 02/16/15  3:04 PM  Result Value Ref Range   Color, Urine AMBER (A) YELLOW    Comment: BIOCHEMICALS MAY BE AFFECTED BY COLOR   APPearance TURBID (A) CLEAR   Specific Gravity, Urine 1.025 1.005 - 1.030   pH 6.0 5.0 - 8.0   Glucose, UA NEGATIVE NEGATIVE mg/dL   Hgb urine dipstick MODERATE (A) NEGATIVE   Bilirubin Urine NEGATIVE NEGATIVE   Ketones, ur 15 (A) NEGATIVE mg/dL   Protein, ur 30 (A) NEGATIVE mg/dL   Urobilinogen, UA 1.0 0.0 - 1.0 mg/dL   Nitrite POSITIVE (A) NEGATIVE   Leukocytes, UA LARGE (A) NEGATIVE  Urine microscopic-add on     Status: Abnormal   Collection Time: 02/16/15  3:04 PM  Result Value Ref Range   Squamous Epithelial / LPF RARE  RARE   WBC, UA TOO NUMEROUS TO COUNT <3 WBC/hpf   RBC / HPF 0-2 <3 RBC/hpf   Bacteria, UA MANY (A) RARE     HEENT: normal except keeps R eye closed to prevent diplopia Cardio: RRR and no murmur Resp: CTA B/L and unlabored GI: BS positive and NT, ND Extremity:  Pulses positive and No Edema Skin:   Wound C/D/I and ecchymotic right suboccipital  Neuro: Alert/Oriented, Normal Sensory, Normal Motor, Abnormal FMC  Right UE and LE Ataxic/ dec FMC and Other severe dysmetria FNF and HS,+horz nystagmus, EOMI Musc/Skel:  Normal Gen NAD   Assessment/Plan: 1. 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 Physiatrist is providing close team supervision and 24 hour management of active medical problems listed below. Physiatrist and rehab team continue to assess barriers to discharge/monitor patient progress toward functional and medical goals.  FIM: FIM - Bathing Bathing Steps Patient Completed: Chest, Abdomen, Front perineal area, Right upper leg, Left upper leg Bathing: 3: Mod-Patient completes 5-7 95f10 parts or 50-74%  FIM - Upper Body Dressing/Undressing Upper body dressing/undressing steps patient completed: Thread/unthread left bra strap, Thread/unthread right sleeve of front closure shirt/dress, Thread/unthread left sleeve of front closure shirt/dress, Thread/unthread right bra strap, Hook/unhook bra Upper body dressing/undressing: 3: Mod-Patient completed 50-74% of tasks FIM - Lower Body Dressing/Undressing Lower body dressing/undressing steps patient  completed: Pull underwear up/down, Pull pants up/down Lower body dressing/undressing: 2: Max-Patient completed 25-49% of tasks  FIM - Toileting Toileting steps completed by patient: Performs perineal hygiene Toileting Assistive Devices: Grab bar or rail for support Toileting: 2: Max-Patient completed 1 of 3 steps  FIM - Glass blower/designer Devices: Grab bars Toilet Transfers: 2-To toilet/BSC: Max A (lift and lower assist)  FIM - Engineer, site Assistive Devices: Arm rests Bed/Chair Transfer: 4: Sit > Supine: Min A (steadying pt. > 75%/lift 1 leg), 3: Chair or W/C > Bed: Mod A (lift or lower assist)  FIM - Locomotion: Wheelchair Distance: 40 Locomotion: Wheelchair: 1: Travels less than 50 ft with supervision, cueing or coaxing FIM - Locomotion: Ambulation Locomotion: Ambulation Assistive Devices: Administrator Ambulation/Gait Assistance: 2: Max assist, 1: +2 Total assist Locomotion: Ambulation: 1: Two helpers  Comprehension Comprehension Mode: Auditory Comprehension: 5-Understands basic 90% of the time/requires cueing < 10% of the time  Expression Expression Mode: Verbal Expression: 5-Expresses basic 90% of the time/requires cueing < 10% of the time.  Social Interaction Social Interaction: 4-Interacts appropriately 75 - 89% of the time - Needs redirection for appropriate language or to initiate interaction.  Problem Solving Problem Solving: 4-Solves basic 75 - 89% of the time/requires cueing 10 - 24% of the time  Memory Memory: 5-Recognizes or recalls 90% of the time/requires cueing < 10% of the time  Medical Problem List and Plan: 1. Functional deficits secondary to right cerebellar hemorrhage status post craniotomy evacuation of hematoma 02/05/2015 2. DVT Prophylaxis/Anticoagulation: Subcutaneous heparin initiated 02/06/2015. Monitor for any bleeding episodes 3. Pain Management: Tylenol as needed 4. A. fib with RVR. Continue Cardizem 60 mg 4 times a day, Lopressor 50 mg 3 times a day. Cardiac rate controlled., BP ok as well              5. Neuropsych: This patient is capable of making decisions on her own behalf. 6. Skin/Wound Care: Routine skin checks 7. Fluids/Electrolytes/Nutrition: Routine I/O with follow-up chemistries, hypoK with KCL  supplement 8. Diastolic congestive heart failure. No signs of fluid overload. Follow-up per cardiology services 9. COPD. Nebulizers as directed. No shortness of breath reported 10. Recurrent bladder papillary carcinoma as well as history of breast cancer with mastectomy. 11. Hyperlipidemia. Lipitor 12. Constipation. Laxed assistance. 13.  UTI empiric cephalexin until; C and S back LOS (Days) 4 A FACE TO FACE EVALUATION WAS PERFORMED  KIRSTEINS,ANDREW E 02/18/2015, 7:30 AM

## 2015-02-18 NOTE — Progress Notes (Signed)
Speech Language Pathology Daily Session Note  Patient Details  Name: [name] MRN: [id number] Date of Birth: [birth year]  Today's Date: 02/18/2015 SLP Individual Time: 1300-1400 SLP Individual Time Calculation (min): 60 min  Short Term Goals: Week 1: SLP Short Term Goal 1 (Week 1): Pt will recognize and correct errors in the moment during semi-complex self care and/or home management tasks wtih supervision.  SLP Short Term Goal 2 (Week 1): Pt will complete semi-copmlex self care and/or home management tasks with supervision for problem solving.   SLP Short Term Goal 3 (Week 1): Pt will anticipate at least 2 ways in which her current deficits will impact her ability to complete self care/home management tasks safely and will generate appropriate solutions to problems with supervision.   Skilled Therapeutic Interventions:  Pt was seen for skilled ST targeting cognitive-linguistic goals.  Upon arrival, pt was asleep in bed but easily awakened to voice and light touch and agreeable to participate in [place].  Pt reported that she had difficulty signing checks today. SLP explained pt's ataxia of her right upper extremity as a function of her stroke.  Pt was able to trace the letters of her name for ~90% legibility with min assist verbal cues.  Legibility decreased to 75-80% when copying from a written model for her name, address, letters of the alphabet, and numbers with min assist verbal cues.  SLP also facilitated the session with a structured new learning activity targeting mental flexibility and dual processing for functional problem solving.  Pt completed the abovementioned task with overall min assist verbal cues for working memory.  Pt was transported back to her bedroom.  She requested to return to bed.  Pt was transferred back to bed, all needs left within reach, family present.  Continue per current plan of care.    FIM:  Comprehension Comprehension Mode: Auditory Comprehension: 6-Follows  complex conversation/direction: With extra time/assistive device Expression Expression Mode: Verbal Expression: 5-Expresses complex 90% of the time/cues < 10% of the time Social Interaction Social Interaction: 4-Interacts appropriately 75 - 89% of the time - Needs redirection for appropriate language or to initiate interaction. Problem Solving Problem Solving: 4-Solves basic 75 - 89% of the time/requires cueing 10 - 24% of the time Memory Memory: 5-Recognizes or recalls 90% of the time/requires cueing < 10% of the time  Pain Pain Assessment Pain Assessment: No/denies pain Pain Score: 0-No pain  Therapy/Group: Individual Therapy  [name], [name] 02/18/2015, 4:20 PM

## 2015-02-18 NOTE — Progress Notes (Signed)
Physical Therapy Session Note  Patient Details  Name: [name] MRN: [id number] Date of Birth: 1935-05-04  Today's Date: 02/18/2015 PT Individual Time: 1030-1155 PT Individual Time Calculation (min): 85 min   Short Term Goals: Week 1:  PT Short Term Goal 1 (Week 1): Pt to perform bed mobility with supevision PT Short Term Goal 2 (Week 1): Pt to perform squat pivot transfer bed <> w/c minA PT Short Term Goal 3 (Week 1): Pt to demonstrate w/c propulsion 100 ft supervision PT Short Term Goal 4 (Week 1): Pt to ambulate 75 ft with LRAD and modA +1 PT Short Term Goal 5 (Week 1): Pt to perform ascent/descent of 6 3-inch stairs with modA +1  Skilled Therapeutic Interventions/Progress Updates:   Pt received supine in bed with no c/o pain and agreeable to treatment. Pt instructed in stand pivot transfer bed>w/c with maxA due to urgency and resultant poor foot placement/coordination. Transfer w/c <> toilet with maxA and use of grab rails. Pt performs perineal hygiene independently; pt dons pants with BUEs however requires maxA for balance. In therapy gym pt instructed in gait training in parallel bars x45 min; several rest breaks needed due to fatigue. Initial trials focused on symmetrical weight bearing and vertical posture with use of tactile feedback, verbal cues and visual feedback from mirror. When posture and weight bearing improved, pt instructed to release one UE from bar, resulting in R lateropulsion however improved with several trials. Pt instructed in standing marching to improve weight shifting and R terminal knee/hip extension for upright posture. Pt demonstrates improved performance with verbal/tactile cues to pull R knee away from therapists manual contact, as well as tactile cues to R glutes during stance phase. Performed gait with all above cues for length of parallel bars 10' x 6 repetitions. With increased fatigue pt demonstrates increased gait deviations and R lateral trunk lean,  requiring additional cues for slow, controlled speed. Pt requested to return to room due to fatigue. Pt instructed in seated and supine LE strengthening exercises for 1 set of 15 repetitions of each of the following: seated hip flexion, seated hip abduction with orange level 1 theraband, long arc quad, supine straight leg raise, hip adduction with pillow squeeze, glute sets. Pt remained supine in bed at completion of session with family present and all needs within reach.   Therapy Documentation Precautions:  Precautions Precautions: Fall Precaution Comments: truncal ataxia, LUE ataxia Restrictions Weight Bearing Restrictions: No Pain: Pain Assessment Pain Assessment: No/denies pain Pain Score: 0-No pain Locomotion : Ambulation Ambulation/Gait Assistance: 3: Mod assist   See FIM for current functional status  Therapy/Group: Individual Therapy  Luberta Mutter 02/18/2015, 12:24 PM

## 2015-02-19 ENCOUNTER — Inpatient Hospital Stay (HOSPITAL_COMMUNITY): Payer: Medicare Other | Admitting: Occupational Therapy

## 2015-02-19 ENCOUNTER — Inpatient Hospital Stay (HOSPITAL_COMMUNITY): Payer: Medicare Other | Admitting: *Deleted

## 2015-02-19 LAB — URINALYSIS, ROUTINE W REFLEX MICROSCOPIC
Bilirubin Urine: NEGATIVE
GLUCOSE, UA: NEGATIVE mg/dL
KETONES UR: 40 mg/dL — AB
Nitrite: NEGATIVE
Protein, ur: NEGATIVE mg/dL
Specific Gravity, Urine: 1.022 (ref 1.005–1.030)
Urobilinogen, UA: 1 mg/dL (ref 0.0–1.0)
pH: 6.5 (ref 5.0–8.0)

## 2015-02-19 LAB — URINE CULTURE: Culture: >=100000

## 2015-02-19 LAB — URINE MICROSCOPIC-ADD ON

## 2015-02-19 NOTE — Progress Notes (Signed)
Physical Therapy Session Note  Patient Details  Name: [name] MRN: [id number] Date of Birth: 10-Apr-1935  Today's Date: 02/19/2015    Skilled Therapeutic Interventions/Progress Updates:  Session I 0900-1000 (60 min)   At the beginning of session patient resting in bed complains of neck pain , reported to nursing and pain medicine administered. Patient performed transfer to sitting EOB with CGA and cues for safety ,as she is very impulsive. Requests to use restroom, transfer to w/c with max A ,bathroom transfer also with max A, as patient pushes to the R , decreased ability to extend hips, cues provided to use grab bars-not successfully.  Patient transported to therapy gym via w/c, family present during session.  Focused on gait training in II bars x 6 with min to mod A , training in forward gait with cues for step length, and width , manual facilitation of posture to reduce strong R side lean, training in stepping backwards with mod A and verbal cues for hand placement on bars.  Static standing w/o UE support with mod A from therapist, manual weight shifting to increase balance and even weight distribution.  Training in sit to stand w/o UE support , max from therapist, applied under L IT and under R arm-(patient is complaining about soreness on L back upper quadrant support shifted to R IT adn L lumbar region.). Full sit to stand x4 ,3/4 of the way x 3- activity performed in order to strengthen B Quads, improve weight shift forward and increase independence with all transfers. Patient returned to room via w/c , cued to use LE for propulsion , increased fatigue after about 15 feet.  Transferred to recliner and left with all needs within reach , and family present.  Session II 1450-1535 (45 min )  Patient started session with bed to w/c transfer with mod A and manual assistance to take a step on R side, transported to bathroom ,max A to transfer to commode and max A for hygiene in  standing.  NuStep x 6 min with resistance level set on 3 to facilitate reciprocal movements and muscle strength.  Therapeutic exercises with 3 lbs on B ankles for  2 sets of 10 of LAQ, , Hamstring curls 2 x 10 with green thera-band, seated hip abd 2 x10 with thera-band, hip add against the resistance of a ball, family instructed in exercises that can be safely performed.  W/C propulsion with B LE with 3 lbs weights on 1 x 12 feet -activity stopped per patients daughter: stated "that is enough". Patient returned to room ,min to mod A for stand pivot transfer to bed, family present when therapist left.  Therapy Documentation Precautions:  Precautions Precautions: Fall Precaution Comments: truncal ataxia, LUE ataxia Restrictions Weight Bearing Restrictions: No Pain: Pain Assessment Pain Score: 5   See FIM for current functional status  Therapy/Group: Individual Therapy  Guadlupe Spanish 02/19/2015, 12:24 PM

## 2015-02-19 NOTE — Progress Notes (Signed)
Occupational Therapy Session Note  Patient Details  Name: BLANNIE SHEDLOCK MRN: [id number] Date of Birth: 14-Feb-1935  Today's Date: 02/19/2015 OT Individual Time:  -   1030-1130  (60 min)  1st session      Short Term Goals: Week 1:  OT Short Term Goal 1 (Week 1): Pt will complete bathing with min assist at sit > stand level OT Short Term Goal 2 (Week 1): Pt will complete LB dressing with mod assist at sit > stand level OT Short Term Goal 3 (Week 1): Pt will complete toilet transfers with min assist OT Short Term Goal 4 (Week 1): Pt will complete walk-in shower transfers with min assist OT Short Term Goal 5 (Week 1): Pt will complete 2 grooming tasks in standing with steady assist for standing balance Week 2:     Skilled Therapeutic Interventions/Progress Updates:       1st session:   Pt. Sitting in recliner upon OT arrival.  Engaged in ADL retraining with focus on functional transfers, sitting balance, sit <> stand, and increased participation with self-care tasks. Squat pivot recliner > w/c>  With mod assist.  Instructed daughters on assisting sit to stand from "sit bones and not lifting from axilla.  Pt is tender on right axilla area and does not tolerate touch.  Two daughters and husband present during session.   Daughters over bearing during session.  Explained plurposed of therapy to get pt to do do and get more mobile.  Pt did sit to stand at sink with mod assist.  Stood for peri care with min assist for stabilization.  Pt. Complained of 5/10 on dizzy scale.  And right rib pain 7/10.    Pt. Hooking 2/3 hooks on bra and donned rest again with daughters trying to intervene.  Transferred back to bed with daughters recaaling technique from previous session which was a scoot pivot.  Pt did half stand and sat and immediately laid on bed.  Left pt with all items in place.   Therapy Documentation Precautions:  Precautions Precautions: Fall Precaution Comments: truncal ataxia, LUE  ataxia Restrictions Weight Bearing Restrictions: No        Pain: Pain Assessment Pain Score: 0-No pain   5/10 on dizzy scale.  And thoracic rib pain 7/10.  1st session  :   Other Treatments:    2nd session    Time:  1400-1430  (30 min) Pain:  6/10 right. Posterior back Individual session:  Pt lying in bed.  Addressed UB AROM, sit to stand and standing balance.  Pt went from supine to EOB with min assist.  Performed UE AROM in all planes.  Sat EOB with performed 8 sit to stand with cues for technique and pushing through legs.    Family present during session.  Pt. Returned to bed at end of session and left with all needs in place.     See FIM for current functional status  Therapy/Group: Individual Therapy  Lisa Roca 02/19/2015, 8:27 AM

## 2015-02-19 NOTE — Progress Notes (Addendum)
[name] is a 80 y.o. female 02-07-1935 [id number]  Subjective: No new complaints. No new problems. Slept well. Feeling OK.  Objective: Vital signs in last 24 hours: Temp:  [98.2 F (36.8 C)-98.4 F (36.9 C)] 98.2 F (36.8 C) (06/18 0515) Pulse Rate:  [76-95] 95 (06/18 0809) Resp:  [18-22] 18 (06/18 0515) BP: (96-134)/(58-74) 134/74 mmHg (06/18 0809) SpO2:  [95 %] 95 % (06/18 0515) Weight change:  Last BM Date: 02/18/15  Intake/Output from previous day: 06/17 0701 - 06/18 0700 In: 210 [P.O.:210] Out: 1 [Stool:1] Last cbgs: CBG (last 3)  No results for input(s): GLUCAP in the last 72 hours.   Physical Exam General: No apparent distress   HEENT: not dry Lungs: Normal effort. Lungs clear to auscultation, no crackles or wheezes. Cardiovascular: irregular rate and rhythm, no edema Abdomen: S/NT/ND; BS(+) Musculoskeletal:  unchanged Neurological: No new neurological deficits Wounds: N/A    Skin: clear  Aging changes Mental state: Alert, oriented, cooperative    Lab Results: BMET    Component Value Date/Time   NA 139 02/15/2015 0455   NA 142 11/02/2014   K 3.0* 02/15/2015 0455   CL 96* 02/15/2015 0455   CO2 34* 02/15/2015 0455   GLUCOSE 94 02/15/2015 0455   BUN 20 02/15/2015 0455   BUN 21 11/02/2014   CREATININE 0.79 02/15/2015 0455   CREATININE 0.7 11/02/2014   CALCIUM 8.5* 02/15/2015 0455   GFRNONAA >60 02/15/2015 0455   GFRAA >60 02/15/2015 0455   CBC    Component Value Date/Time   WBC 10.0 02/15/2015 0455   WBC 7.2 11/02/2014   RBC 4.07 02/15/2015 0455   HGB 12.5 02/15/2015 0455   HCT 38.2 02/15/2015 0455   PLT 244 02/15/2015 0455   MCV 93.9 02/15/2015 0455   MCH 30.7 02/15/2015 0455   MCHC 32.7 02/15/2015 0455   RDW 14.4 02/15/2015 0455   LYMPHSABS 2.1 02/15/2015 0455   MONOABS 1.2* 02/15/2015 0455   EOSABS 0.2 02/15/2015 0455   BASOSABS 0.0 02/15/2015 0455    Studies/Results: No results found.  Medications: I have reviewed the  patient's current medications.  Assessment/Plan:   1. Functional deficits secondary to right cerebellar hemorrhage status post craniotomy evacuation of hematoma 02/05/2015 2. DVT Prophylaxis/Anticoagulation: Subcutaneous heparin initiated 02/06/2015. Monitor for any bleeding episodes 3. Pain Management: Tylenol as needed 4. A. fib with RVR. Continue Cardizem 60 mg 4 times a day, Lopressor 50 mg 3 times a day. Cardiac rate controlled., BP ok as well 5. Neuropsych: This patient is capable of making decisions on her own behalf. 6. Skin/Wound Care: Routine skin checks 7. Fluids/Electrolytes/Nutrition: Routine I/O with follow-up chemistries, hypoK with KCL supplement 8. Diastolic congestive heart failure. No signs of fluid overload. Follow-up per cardiology services 9. COPD. Nebulizers as directed. No shortness of breath reported 10. Recurrent bladder papillary carcinoma as well as history of breast cancer with mastectomy. 11. Hyperlipidemia. Lipitor 12. Constipation. Laxed assistance. 13. UTI empiric cephalexin until; C and S back   Length of stay, days: [place] , MD 02/19/2015, 8:50 AM

## 2015-02-20 ENCOUNTER — Inpatient Hospital Stay (HOSPITAL_COMMUNITY): Payer: Medicare Other | Admitting: *Deleted

## 2015-02-20 ENCOUNTER — Observation Stay (HOSPITAL_COMMUNITY)
Admission: AD | Admit: 2015-02-20 | Discharge: 2015-02-22 | Disposition: A | Discharge status: Home / Self Care | Payer: Medicare Other | Source: Ambulatory Visit | Attending: Internal Medicine | Admitting: Internal Medicine

## 2015-02-20 DIAGNOSIS — I5042 Chronic combined systolic (congestive) and diastolic (congestive) heart failure: Secondary | ICD-10-CM | POA: Diagnosis present

## 2015-02-20 DIAGNOSIS — I10 Essential (primary) hypertension: Secondary | ICD-10-CM | POA: Diagnosis present

## 2015-02-20 DIAGNOSIS — R001 Bradycardia, unspecified: Secondary | ICD-10-CM

## 2015-02-20 DIAGNOSIS — I619 Nontraumatic intracerebral hemorrhage, unspecified: Secondary | ICD-10-CM | POA: Diagnosis not present

## 2015-02-20 DIAGNOSIS — I481 Persistent atrial fibrillation: Secondary | ICD-10-CM | POA: Diagnosis not present

## 2015-02-20 DIAGNOSIS — Z79899 Other long term (current) drug therapy: Secondary | ICD-10-CM | POA: Diagnosis not present

## 2015-02-20 DIAGNOSIS — I4891 Unspecified atrial fibrillation: Secondary | ICD-10-CM | POA: Insufficient documentation

## 2015-02-20 DIAGNOSIS — E785 Hyperlipidemia, unspecified: Secondary | ICD-10-CM | POA: Diagnosis present

## 2015-02-20 DIAGNOSIS — I482 Chronic atrial fibrillation, unspecified: Secondary | ICD-10-CM | POA: Diagnosis present

## 2015-02-20 HISTORY — DX: Nontraumatic intracerebral hemorrhage, unspecified: I61.9

## 2015-02-20 MED ORDER — LEVALBUTEROL TARTRATE 45 MCG/ACT IN AERO: 1.0000 | INHALATION_SPRAY | RESPIRATORY_TRACT | Status: DC | PRN

## 2015-02-20 MED ORDER — ACETAMINOPHEN 325 MG PO TABS
650.0000 mg | ORAL_TABLET | ORAL | Status: DC | PRN
  Administered 2015-02-20 – 2015-02-22 (×5): 650 mg via ORAL
  Filled 2015-02-20 (×5): qty 2

## 2015-02-20 MED ORDER — METOPROLOL TARTRATE 50 MG PO TABS
50.0000 mg | ORAL_TABLET | Freq: Three times a day (TID) | ORAL | Status: DC
  Administered 2015-02-21: 50 mg via ORAL
  Filled 2015-02-20 (×3): qty 1

## 2015-02-20 MED ORDER — PANTOPRAZOLE SODIUM 40 MG PO TBEC
40.0000 mg | DELAYED_RELEASE_TABLET | Freq: Every day | ORAL | Status: DC
  Administered 2015-02-21 – 2015-02-22 (×2): 40 mg via ORAL
  Filled 2015-02-20 (×2): qty 1

## 2015-02-20 MED ORDER — LATANOPROST 0.005 % OP SOLN
1.0000 [drp] | Freq: Every day | OPHTHALMIC | Status: DC
  Administered 2015-02-20 – 2015-02-21 (×2): 1 [drp] via OPHTHALMIC
  Filled 2015-02-20: qty 2.5

## 2015-02-20 MED ORDER — ONDANSETRON HCL 4 MG/2ML IJ SOLN: 4.0000 mg | Freq: Four times a day (QID) | INTRAMUSCULAR | Status: DC | PRN

## 2015-02-20 MED ORDER — ATORVASTATIN CALCIUM 10 MG PO TABS
10.0000 mg | ORAL_TABLET | Freq: Every day | ORAL | Status: DC
  Administered 2015-02-20 – 2015-02-21 (×2): 10 mg via ORAL
  Filled 2015-02-20 (×2): qty 1

## 2015-02-20 MED ORDER — DILTIAZEM HCL 60 MG PO TABS
60.0000 mg | ORAL_TABLET | Freq: Four times a day (QID) | ORAL | Status: DC
  Administered 2015-02-20 – 2015-02-21 (×3): 60 mg via ORAL
  Filled 2015-02-20 (×4): qty 1

## 2015-02-20 MED ORDER — BETHANECHOL CHLORIDE 25 MG PO TABS
25.0000 mg | ORAL_TABLET | Freq: Three times a day (TID) | ORAL | Status: DC
  Administered 2015-02-20 – 2015-02-21 (×2): 25 mg via ORAL
  Filled 2015-02-20 (×7): qty 1

## 2015-02-20 MED ORDER — HEPARIN SODIUM (PORCINE) 5000 UNIT/ML IJ SOLN
5000.0000 [IU] | Freq: Three times a day (TID) | INTRAMUSCULAR | Status: DC
  Administered 2015-02-20 – 2015-02-22 (×5): 5000 [IU] via SUBCUTANEOUS
  Filled 2015-02-20 (×5): qty 1

## 2015-02-20 MED ORDER — ALBUTEROL SULFATE (2.5 MG/3ML) 0.083% IN NEBU: 2.5000 mg | INHALATION_SOLUTION | Freq: Four times a day (QID) | RESPIRATORY_TRACT | Status: DC | PRN

## 2015-02-20 NOTE — Progress Notes (Signed)
Patient transferred to 3W01 accompanied by staff and family members. Patient's stable . No signs and symptoms of respiratory distress noted. Denies any pain.Report given to the next shift nurse.

## 2015-02-20 NOTE — Progress Notes (Signed)
Called [name], [facility] and Dr. Caryl Comes (Cardiologist) consult to further evaluate the patient.

## 2015-02-20 NOTE — Progress Notes (Addendum)
Physical Therapy Treatment Patient Details Name: ILLONA BULMAN MRN: [id number] DOB: August 13, 1935 Today's Date: 02/20/2015   Session 1300-1330 Patient in room at the beginning of session, agrees to therapy, session initiated with w/c propulsion using B LE, patient needed increased cues to use R LE. Patient family present during session, when approached gym ,one of the daughters stated "mom had low oxygen level today" . VS taken SA02 -83% HR fluctuating between 200 BP and 25 BP ,BP 101/50.Attempted manual check of HR-very inconsistent beats difficult to check . Patient transferred back to room and transferred to supine, Nursing has been notified immediately and have called rapid response team. Patient in bed with medical services being provided .   Guadlupe Spanish 02/20/2015, 4:18 PM

## 2015-02-20 NOTE — Progress Notes (Addendum)
Please see previous H&P done prior to the transfer for full detail.  Did not see patient's name on cardiology service list after transfer. Unfortunately all of the Sign and Hold admission orders I placed were deleted upon previous discharge by Inpatient Rehab. Will place new admission orders.  Per IM note, currently on subcu heparin only, monitor for bleeding. No systemic heparin given recent hemorrhagic stroke.   Hilbert Corrigan PA Pager: [phone]

## 2015-02-20 NOTE — Progress Notes (Signed)
[name] is a 80 y.o. female [birth year] [id number]  Subjective: No new complaints. No new problems. Slept well. Feeling OK. BP, HR is better  Objective: Vital signs in last 24 hours: Temp:  [97.6 F (36.4 C)-99 F (37.2 C)] 97.7 F (36.5 C) (06/19 0805) Pulse Rate:  [64-130] 71 (06/19 0806) Resp:  [18-24] 24 (06/19 0805) BP: (100-185)/(58-98) 130/90 mmHg (06/19 0806) SpO2:  [93 %-98 %] 94 % (06/19 0805) Weight change:  Last BM Date: 02/18/15  Intake/Output from previous day: 06/18 0701 - 06/19 0700 In: 68 [P.O.:420] Out: -  Last cbgs: CBG (last 3)  No results for input(s): GLUCAP in the last 72 hours.   Physical Exam General: No apparent distress   HEENT: not dry Lungs: Normal effort. Lungs clear to auscultation, no crackles or wheezes. Cardiovascular: irregular rate and rhythm, no edema Abdomen: S/NT/ND; BS(+) Musculoskeletal:  unchanged Neurological: No new neurological deficits Wounds: N/A    Skin: clear  Aging changes Mental state: Alert, oriented, cooperative    Lab Results: BMET    Component Value Date/Time   NA 139 02/15/2015 0455   NA 142 11/02/2014   K 3.0* 02/15/2015 0455   CL 96* 02/15/2015 0455   CO2 34* 02/15/2015 0455   GLUCOSE 94 02/15/2015 0455   BUN 20 02/15/2015 0455   BUN 21 11/02/2014   CREATININE 0.79 02/15/2015 0455   CREATININE 0.7 11/02/2014   CALCIUM 8.5* 02/15/2015 0455   GFRNONAA >60 02/15/2015 0455   GFRAA >60 02/15/2015 0455   CBC    Component Value Date/Time   WBC 10.0 02/15/2015 0455   WBC 7.2 11/02/2014   RBC 4.07 02/15/2015 0455   HGB 12.5 02/15/2015 0455   HCT 38.2 02/15/2015 0455   PLT 244 02/15/2015 0455   MCV 93.9 02/15/2015 0455   MCH 30.7 02/15/2015 0455   MCHC 32.7 02/15/2015 0455   RDW 14.4 02/15/2015 0455   LYMPHSABS 2.1 02/15/2015 0455   MONOABS 1.2* 02/15/2015 0455   EOSABS 0.2 02/15/2015 0455   BASOSABS 0.0 02/15/2015 0455    Studies/Results: No results found.  Medications: I have  reviewed the patient's current medications.  Assessment/Plan:   1. Functional deficits secondary to right cerebellar hemorrhage status post craniotomy evacuation of hematoma 02/05/2015 2. DVT Prophylaxis/Anticoagulation: Subcutaneous heparin initiated 02/06/2015. Monitor for any bleeding episodes 3. Pain Management: Tylenol as needed 4. A. fib with RVR. Continue Cardizem 60 mg 4 times a day, Lopressor 50 mg 3 times a day. Cardiac rate controlled., BP ok as well 5. Neuropsych: This patient is capable of making decisions on her own behalf. 6. Skin/Wound Care: Routine skin checks 7. Fluids/Electrolytes/Nutrition: Routine I/O with follow-up chemistries, hypoK with KCL supplement 8. Diastolic congestive heart failure. No signs of fluid overload. Follow-up per cardiology services 9. COPD. Nebulizers as directed. No shortness of breath reported 10. Recurrent bladder papillary carcinoma as well as history of breast cancer with mastectomy. 11. Hyperlipidemia. Lipitor 12. Constipation. Laxed assistance. 13. UTI empiric cephalexin until; C and S back  Cont current Rx  Length of stay, days: 6  Walker Kehr , MD 02/20/2015, 8:48 AM

## 2015-02-20 NOTE — Progress Notes (Signed)
[name], [place] and Dr. Caryl Comes arrived in the unit assesed and evaluate the patient.[name]

## 2015-02-20 NOTE — Progress Notes (Signed)
Patient Heart rate fluctuating from 200-25.O2 sat from 81-95% Patient asymptomatic. Denies any  chest pain. Respiratory therapist, Rapid response, charge nurse. Dr. Burnice Logan notified. Order EKG stat and stat Cardiology consult.

## 2015-02-20 NOTE — H&P (Signed)
CARDIOLOGY H&P NOTE   Patient ID: [name] MRN: [id number], DOB/AGE: February 23, 1935   Admit date: 02/14/2015 Date of Consult: 02/20/2015   Primary Physician: [name], MD Primary Cardiologist: Dr. Gwenlyn Found (previously seen by Dr. Percival Spanish in 2010)   Pt. Profile  80 year old Caucasian female who was admitted on 02/07/2015 with cerebellar hemorrhage and found to have a-fib, not a candidate for systemic anticoagulation due to brain bleed. Rapid response called by inpatient rehab as pt's HR was noted to be ranging 20s-200 despite being completely asymptomatic. After hooked up to telemetry, HR was 60-80s. Cardiology urgently consulted.   Problem List  Past Medical History  Diagnosis Date  . History of CHF (congestive heart failure)     POST SURG 2010  . COPD (chronic obstructive pulmonary disease)   . History of breast cancer     DX DCIS IN 2004--  S/P RIGHT MASTECTOMY , NO CHEMORADIATION---  NO RECURRENCE  . Glaucoma of both eyes   . History of atrial fibrillation without current medication 2010    POST SURG 2010--  PER PT NO ISSUES SINCE  . Arthritis   . Dyspnea on exertion   . GERD (gastroesophageal reflux disease)   . Nephrolithiasis   . History of shingles     02/ 2015--  back of neck and left flank--  no residual pain  . History of hypertension   . Recurrent bladder papillary carcinoma first dx 06/ 2014    s/p  turbt's  and instillation mitomycin c (chemo)    Past Surgical History  Procedure Laterality Date  . Total knee arthroplasty Right 03-22-2009  . Partial mastectomy with needle localization Right 01-14-2003    DCIS  . Total mastectomy Right 02-03-2003    W/ SLN BX  AND  POST 03-01-2003 EVACUATION HEMATOMA  . Removal cyst left hand  2013  . Transthoracic echocardiogram  03-25-2009    MILD LVF/ EF 70-80%/ MILD INCREASE SYSTOLIC PULMONARY  PRESSURE  . Transurethral resection of bladder tumor with gyrus (turbt-gyrus) N/A 03/26/2013    Procedure: TRANSURETHRAL  RESECTION OF BLADDER TUMOR WITH GYRUS (TURBT-GYRUS);  Surgeon: Alexis Frock, MD;  Location: [facility];  Service: Urology;  Laterality: N/A;  . Cystoscopy w/ ureteral stent placement Bilateral 03/26/2013    Procedure: CYSTOSCOPY WITH BILATERAL RETROGRADE PYELOGRAM/URETERAL STENT PLACEMENT;  Surgeon: Alexis Frock, MD;  Location: [facility];  Service: Urology;  Laterality: Bilateral;  . Transurethral resection of bladder tumor with gyrus (turbt-gyrus) N/A 05/13/2013    Procedure: TRANSURETHRAL RESECTION OF BLADDER TUMOR WITH GYRUS (TURBT-GYRUS)  RE-STAGING TRANSURETHRAL RESECTION OF BLADDER TUMOR, LEFT RETROGRADE PYELOGRAM AND STENT EXCHANGE;  Surgeon: Alexis Frock, MD;  Location: [facility];  Service: Urology;  Laterality: N/A;  . Cystoscopy w/ ureteral stent placement Left 05/13/2013    Procedure: CYSTOSCOPY WITH RETROGRADE PYELOGRAM/URETERAL STENT PLACEMENT STENT EXCHANGE;  Surgeon: Alexis Frock, MD;  Location: [facility];  Service: Urology;  Laterality: Left;  . Tubal ligation    . Cataract extraction w/ intraocular lens  implant, bilateral    . Transurethral resection of bladder tumor with gyrus (turbt-gyrus) N/A 08/11/2014    Procedure: TRANSURETHRAL RESECTION OF BLADDER TUMOR WITH GYRUS (TURBT-GYRUS);  Surgeon: Alexis Frock, MD;  Location: [facility];  Service: Urology;  Laterality: N/A;  . Cystoscopy w/ retrogrades Bilateral 08/11/2014    Procedure: CYSTOSCOPY WITH BILATERAL RETROGRADE PYELOGRAM AND MITOMYCIN INSTILLATION;  Surgeon: Alexis Frock, MD;  Location: [facility];  Service: Urology;  Laterality: Bilateral;  . Craniectomy N/A 02/05/2015    Procedure: SUBOCCIPITAL CRANIECTOMY EVACUATION OF HEMATOMA;  Surgeon: Consuella Lose, MD;  Location: [facility];  Service: Neurosurgery;  Laterality: N/A;     Allergies  Allergies  Allergen Reactions  . Morphine And Related Nausea And  Vomiting    HPI   The patient is a 80 year old Caucasian female who was admitted on 02/07/2015 with cerebellar hemorrhage. She had a history of hypertension and a remote history of paroxysmal atrial fibrillation in the setting of knee replacement. She developed rapid a-fib on the first day of previous admission and cardiology consulted. Due to cerebellar hemorrhage, she was deemed not a candidate for systemic anticoagulation. She was initially started on IV diltiazem on top of PO beta blocker. She was eventually discharged to inpatient rehabilitation on 02/14/2015 on metoprolol 50 mg every 8 hours and diltiazem 60 mg 4 times a day.  Since transferring to inpatient rehabilitation, patient has been doing well for the past 2 weeks. Patient was going to cardiac rehabilitation on 02/20/2015, when machine at rehabilitation facility recorded heart rate of 200 and subsequent heart rate in the 20s. Patient has been asymptomatic. Urgent cardiology consult was placed. On arrival, patient has been hoped up to telemetry machine which recorded heart rate has been in the 60 to 80s. She has been asymptomatic throughout the entire event and it is unclear at this time whether to machine at rehabilitation facility was recording correctly or not. Her current systolic blood pressure has been in the 90s to 100. She is laying in bed comfortably, continued to have blurred vision in the right visual field and the weakness on the right lower extremity after recent hemorrhagic stroke.  Inpatient Medications  . antiseptic oral rinse  7 mL Mouth Rinse BID  . atorvastatin  10 mg Oral q1800  . cephALEXin  250 mg Oral 3 times per day  . diltiazem  60 mg Oral QID  . docusate sodium  100 mg Oral BID  . heparin  5,000 Units Subcutaneous 3 times per day  . latanoprost  1 drop Both Eyes QHS  . metoprolol tartrate  50 mg Oral TID  . pantoprazole  40 mg Oral Daily  . potassium chloride  20 mEq Oral Daily  . senna  1 tablet Oral BID     Family History Family History  Problem Relation Age of Onset  . Colon cancer Mother 30  . Colon cancer Maternal Grandmother   . Breast cancer Maternal Aunt 18  . Cancer Maternal Aunt     ? ovarian  . Breast cancer Other 60    breast cancer, ovarian cancer  . Breast cancer Daughter 80     Social History History   Social History  . Marital Status: Married    Spouse Name: N/A  . Number of Children: N/A  . Years of Education: N/A   Occupational History  . Not on file.   Social History Main Topics  . Smoking status: Former Smoker -- 1.00 packs/day for 40 years    Types: Cigarettes    Quit date: 03/20/2003  . Smokeless tobacco: Never Used  . Alcohol Use: No  . Drug Use: No  . Sexual Activity: Not on file   Other Topics Concern  . Not on file   Social History Narrative     Review of Systems  General:  No chills, fever, night sweats or weight changes.  Cardiovascular:  No chest pain, dyspnea on exertion, edema, orthopnea,  palpitations, paroxysmal nocturnal dyspnea. No cardiac awareness of a-fib Dermatological: No rash, lesions/masses Respiratory: No cough, dyspnea Urologic: No hematuria, dysuria Abdominal:   No nausea, vomiting, diarrhea, bright red blood per rectum, melena, or hematemesis Neurologic:  No changes in mental status. R sided blurry vision and R LE weakness after recent stroke All other systems reviewed and are otherwise negative except as noted above.  Physical Exam  Blood pressure 90/60, pulse 62, temperature 97.7 F (36.5 C), temperature source Oral, resp. rate 22, weight 155 lb 12.8 oz (70.67 kg), last menstrual period 12/03/2002, SpO2 100 %.  General: Pleasant, NAD Psych: Normal affect. Neuro: Alert and oriented X 3. Moves all extremities spontaneously. HEENT: Normal  Neck: Supple without bruits or JVD. Lungs:  Resp regular and unlabored, CTA. Heart: irregular. no s3, s4, or murmurs. Abdomen: Soft, non-tender, non-distended, BS + x 4.   Extremities: No clubbing, cyanosis or edema. DP/PT/Radials 2+ and equal bilaterally.  Labs  No results for input(s): CKTOTAL, CKMB, TROPONINI in the last 72 hours. Lab Results  Component Value Date   WBC 10.0 02/15/2015   HGB 12.5 02/15/2015   HCT 38.2 02/15/2015   MCV 93.9 02/15/2015   PLT 244 02/15/2015    Recent Labs Lab 02/15/15 0455  NA 139  K 3.0*  CL 96*  CO2 34*  BUN 20  CREATININE 0.79  CALCIUM 8.5*  PROT 5.6*  BILITOT 1.0  ALKPHOS 88  ALT 20  AST 25  GLUCOSE 94   Lab Results  Component Value Date   CHOL 175 02/06/2015   HDL 51 02/06/2015   LDLCALC 101* 02/06/2015   TRIG 117 02/06/2015   No results found for: DDIMER  Radiology/Studies  Ct Head Wo Contrast  02/13/2015   CLINICAL DATA:  Hemorrhagic stroke, follow-up.  EXAM: CT HEAD WITHOUT CONTRAST  TECHNIQUE: Contiguous axial images were obtained from the base of the skull through the vertex without intravenous contrast.  COMPARISON:  CT head February 10, 2015  FINDINGS: Evolving 4.3 x 3.1 cm hematoma was 4 x 9 x 3.5 cm. Surrounding low-density vasogenic edema with persistent effacement of the fourth ventricle. Partially effaced super cerebellar cistern. Anterior recess of the third ventricle is 13 mm, previously 15 mm. Mild interstitial edema. Remote RIGHT caudate body lacunar infarct. No acute large vascular territory infarct. Patchy white matter changes most consistent chronic small vessel ischemic disease.  No abnormal extra-axial fluid collections. Moderate calcific atherosclerosis of the carotid siphons. Status post bilateral ocular lens implants. Recent RIGHT retro sigmoid craniotomy. Overlying soft tissue swelling with minimal residual subcutaneous gas.  IMPRESSION: Evolving, contracting RIGHT cerebellar intraparenchymal hematoma with mild, improved obstructive hydrocephalus.   Electronically Signed   By: Elon Alas M.D.   On: 02/13/2015 06:11   Ct Head Wo Contrast  02/10/2015   CLINICAL DATA:   Intraventricular hemorrhage with surgical evacuation.  EXAM: CT HEAD WITHOUT CONTRAST  TECHNIQUE: Contiguous axial images were obtained from the base of the skull through the vertex without intravenous contrast.  COMPARISON:  CT head half 02/08/2015  FINDINGS: Right occipital craniotomy for right cerebellar hematoma evacuation. High-density hemorrhage is slightly larger compared with the prior study, now measuring 3.5 x 4.9 cm, compared with 2.9 x 4.6 cm previously. There is edema surrounding the hematoma. There is mass-effect on the fourth ventricle and brainstem. Mild ventricular enlargement is stable. There is mild hydrocephalus compared with the initial CT of 02/05/2015. Generalized atrophy is present.  Mild chronic microvascular ischemia. No acute ischemic infarct. No shift  of the midline structures  IMPRESSION: Right cerebellar hematoma is slightly larger compared to the prior study. There is mass-effect on the fourth ventricle and brainstem and mild hydrocephalus which is stable.   Electronically Signed   By: Franchot Gallo M.D.   On: 02/10/2015 10:30   Ct Head Wo Contrast  02/08/2015   CLINICAL DATA:  80 year old female post surgical drainage of cerebellar hematoma. Follow-up. Subsequent encounter.  EXAM: CT HEAD WITHOUT CONTRAST  TECHNIQUE: Contiguous axial images were obtained from the base of the skull through the vertex without intravenous contrast.  COMPARISON:  02/06/2015.  FINDINGS: Post right suboccipital craniectomy for drainage of right-sided cerebellar parenchymal hematoma. Residual right cerebellar hematoma has changed configuration spanning over 4.6 x 2.9 cm versus prior 4.1 x 3.5 cm. Surrounding vasogenic edema and mass effect with mild downward herniation of the right cerebellar tonsil and upward herniation of the right vermis and cerebellum. Progressive mass effect upon the fourth ventricle, aqueduct and deformity of the brainstem/ quadrigeminal plate.  Interval enlargement of the lateral  ventricles consistent with mild mild hydrocephalus. Clinical correlation and close follow-up recommended.  The very small anterior right posterior fossa subdural hematoma bordering the petrous bone.  Baseline mild global atrophy.  No CT evidence of large acute thrombotic infarct or mass separate from above described findings. Small vessel disease type changes are noted.  Vascular calcifications.  IMPRESSION: Interval development of mild hydrocephalus post partial drainage of right cerebellar hematoma. Clinical correlation and close CT monitoring recommended.  Change in configuration of the right cerebellar hematoma at as noted above. Surrounding vasogenic edema and mass effect including downward mild herniation of the right cerebellar tonsil and moderate upward herniation of the right vermis and cerebellum with compression of the fourth ventricle, aqueduct, brainstem and quadrigeminal plate.  These results were called by telephone at the time of interpretation on 02/08/2015 at 6:54 am to Jenny Reichmann patient's nurse who verbally acknowledged these results.   Electronically Signed   By: Genia Del M.D.   On: 02/08/2015 07:15   Ct Head Wo Contrast  02/06/2015   CLINICAL DATA:  79 year old female post evacuation right cerebellar hematoma. Dizziness with nausea. Speech difficulty with trouble writing. Subsequent exam.  EXAM: CT HEAD WITHOUT CONTRAST  TECHNIQUE: Contiguous axial images were obtained from the base of the skull through the vertex without intravenous contrast.  COMPARISON:  02/05/2015 CT.  FINDINGS: Post right occipital craniotomy for drainage of right cerebellar hematoma. Although there has been debulking of the central aspect of the hematoma, right cerebellar hematoma remains spanning over 4.1 x 3.5 cm versus prior 4.4 x 3.5 cm.  Surrounding vasogenic edema and mass effect have progressed slightly. Compression of the fourth ventricle has progressed since the prior exam. Right cerebellar tonsil extension into  the foramen magnum and upward herniation of the right cerebellum. Compression of the right aspect of the quadrigeminal plate has progressed slightly. Ventricular size including asymmetric mild prominence of the left temporal horn relatively similar to prior exam. Close follow-up recommended to exclude developing hydrocephalus.  Small amount of pneumocephalus  Remote mid right coronal radiata infarct.  Small vessel disease type changes.  Vascular calcifications.  IMPRESSION: Post right occipital craniotomy for drainage of right cerebellar hematoma. Although there has been debulking of the central aspect of the hematoma, right cerebellar hematoma remains spanning over 4.1 x 3.5 cm versus prior 4.4 x 3.5 cm.  Surrounding vasogenic edema and mass effect have progressed slightly. Compression of the fourth ventricle has progressed since the  prior exam. Right cerebellar tonsil extension into the foramen magnum and upward herniation of the right cerebellum. Compression of the right aspect of the quadrigeminal plate has progressed slightly. Ventricular size including asymmetric mild prominence of the left temporal horn relatively similar to prior exam. Patient is at risk for development of hydrocephalus. Close follow-up recommended.  These results will be called to the ordering clinician or representative by the Radiologist Assistant, and communication documented in the PACS or zVision Dashboard.   Electronically Signed   By: Genia Del M.D.   On: 02/06/2015 12:55   Ct Head Wo Contrast  02/05/2015   CLINICAL DATA:  Code stroke. Dizziness. Nausea and vomiting. Symptoms began at 6:30 a.m. Multiple episodes of emesis.  EXAM: CT HEAD WITHOUT CONTRAST  TECHNIQUE: Contiguous axial images were obtained from the base of the skull through the vertex without intravenous contrast.  COMPARISON:  None.  FINDINGS: Acute hemorrhage is present within the right cerebellum measuring 4.0 x 3.6 cm. The estimated volume is 25-30 mL. This  creates significant mass effect on the fourth ventricle. There surrounding edema. There is no hydrocephalus.  A lacunar infarct in the right caudate head is remote. No acute supratentorial infarct, hemorrhage, or mass lesion is present. There is no midline shift. The ventricles are of normal size. No significant extra-axial fluid collection is present.  The paranasal sinuses mastoid air cells are clear. The calvarium is intact.  IMPRESSION: 1. 4.0 cm acute hemorrhage within the right cerebellar hemisphere with surrounding edema and mass effect on the fourth ventricle. There is no hydrocephalus. The estimated volume is 25-30 mL. 2. Lacunar infarct of the right caudate head appears remote. 3. No acute supratentorial abnormality.  These results were called by telephone at the time of interpretation on 02/05/2015 at 10:35 am to Dr. Wallie Char, who verbally acknowledged these results.   Electronically Signed   By: San Morelle M.D.   On: 02/05/2015 10:37   Dg Chest Portable 1 View  02/05/2015   CLINICAL DATA:  Code stroke. Patient also with dizziness nausea and vomiting since this morning. History of COPD and CHF.  EXAM: PORTABLE CHEST - 1 VIEW  COMPARISON:  03/26/2013.  FINDINGS: Cardiac silhouette is normal size. No mediastinal or hilar masses or convincing adenopathy. Relatively low lung volumes. No lung consolidation or edema. No pleural effusion or pneumothorax.  Bony thorax is demineralized but grossly intact.  IMPRESSION: No acute cardiopulmonary disease.   Electronically Signed   By: Lajean Manes M.D.   On: 02/05/2015 10:33    ECG  A-fib with TWI in V4-V6 unchanged since 02/05/2015  ASSESSMENT AND PLAN  1. Persistent atrial fibrillation with ventricular rate 60-80  - rapid response called because of machine recorded HR range between 20s-200, unclear accuracy. HR has been 60-80s after hooked up to telemetry  - discussed with Dr. Caryl Comes, will try to transfer to 4N (if no bed 3W) for 24 hours  observation  - will continue short acting metoprolol and diltiazem for now  - based on BP today, will then consolidate metoprolol (likely 39m BID and diltiazem likely 180-2430mdaily) to allow more stable BP  - CHA2DS2-Vasc score 5 (stroke, female, age), no systemic anticoagulation given recent stroke  2. Hemorrhagic cerebellar stroke: 2 wks ago, with R sided visual and strength deficit.  3. HTN: on diltiazem and metoprolol  4. COPD   Signed, MeAlmyra DeforestPA-C 02/20/2015, 2:19 PM

## 2015-02-20 NOTE — Progress Notes (Signed)
Dr. Burnice Logan made aware that the patient will be transferred to [id number] a 24 hour observation as per Dr. KCaryl Comes Patient resting quietly at this time. Family members at bedside.

## 2015-02-21 ENCOUNTER — Inpatient Hospital Stay (HOSPITAL_COMMUNITY): Payer: Medicare Other | Admitting: Physical Therapy

## 2015-02-21 ENCOUNTER — Inpatient Hospital Stay (HOSPITAL_COMMUNITY): Payer: Medicare Other | Admitting: Speech Pathology

## 2015-02-21 ENCOUNTER — Inpatient Hospital Stay (HOSPITAL_COMMUNITY): Payer: Medicare Other | Admitting: Occupational Therapy

## 2015-02-21 ENCOUNTER — Encounter (HOSPITAL_COMMUNITY): Payer: Self-pay | Admitting: General Practice

## 2015-02-21 DIAGNOSIS — I10 Essential (primary) hypertension: Secondary | ICD-10-CM | POA: Diagnosis not present

## 2015-02-21 DIAGNOSIS — Z79899 Other long term (current) drug therapy: Secondary | ICD-10-CM | POA: Diagnosis not present

## 2015-02-21 DIAGNOSIS — I480 Paroxysmal atrial fibrillation: Secondary | ICD-10-CM | POA: Diagnosis not present

## 2015-02-21 DIAGNOSIS — I4891 Unspecified atrial fibrillation: Secondary | ICD-10-CM | POA: Diagnosis not present

## 2015-02-21 DIAGNOSIS — I509 Heart failure, unspecified: Secondary | ICD-10-CM | POA: Diagnosis not present

## 2015-02-21 DIAGNOSIS — I5042 Chronic combined systolic (congestive) and diastolic (congestive) heart failure: Secondary | ICD-10-CM

## 2015-02-21 DIAGNOSIS — I619 Nontraumatic intracerebral hemorrhage, unspecified: Secondary | ICD-10-CM | POA: Diagnosis not present

## 2015-02-21 LAB — BASIC METABOLIC PANEL
Anion gap: 7 (ref 5–15)
BUN: 13 mg/dL (ref 6–20)
CO2: 32 mmol/L (ref 22–32)
Calcium: 8.5 mg/dL — ABNORMAL LOW (ref 8.9–10.3)
Chloride: 100 mmol/L — ABNORMAL LOW (ref 101–111)
Creatinine, Ser: 0.54 mg/dL (ref 0.44–1.00)
GFR calc non Af Amer: >60 mL/min (ref >60)
GLUCOSE: 91 mg/dL (ref 65–99)
POTASSIUM: 3.8 mmol/L (ref 3.5–5.1)
Sodium: 139 mmol/L (ref 135–145)

## 2015-02-21 LAB — URINE CULTURE

## 2015-02-21 LAB — MAGNESIUM: MAGNESIUM: 2.1 mg/dL (ref 1.7–2.4)

## 2015-02-21 MED ORDER — DILTIAZEM HCL ER COATED BEADS 180 MG PO CP24
180.0000 mg | ORAL_CAPSULE | Freq: Every day | ORAL | Status: DC
  Administered 2015-02-21: 180 mg via ORAL
  Filled 2015-02-21: qty 1

## 2015-02-21 MED ORDER — METOPROLOL TARTRATE 50 MG PO TABS
75.0000 mg | ORAL_TABLET | Freq: Two times a day (BID) | ORAL | Status: DC
  Administered 2015-02-21 – 2015-02-22 (×2): 75 mg via ORAL
  Filled 2015-02-21 (×4): qty 1

## 2015-02-21 MED ORDER — METOPROLOL TARTRATE 25 MG PO TABS
25.0000 mg | ORAL_TABLET | Freq: Once | ORAL | Status: AC
  Administered 2015-02-21: 25 mg via ORAL
  Filled 2015-02-21: qty 1

## 2015-02-21 MED ORDER — CEPHALEXIN 250 MG PO CAPS
250.0000 mg | ORAL_CAPSULE | Freq: Two times a day (BID) | ORAL | Status: DC
Start: 2015-02-21 — End: 2015-02-21
  Filled 2015-02-21: qty 1

## 2015-02-21 MED ORDER — CEPHALEXIN 250 MG PO CAPS
250.0000 mg | ORAL_CAPSULE | Freq: Three times a day (TID) | ORAL | Status: DC
Start: 2015-02-21 — End: 2015-02-22
  Administered 2015-02-21 – 2015-02-22 (×3): 250 mg via ORAL
  Filled 2015-02-21 (×5): qty 1

## 2015-02-21 NOTE — Progress Notes (Signed)
Sharrell Ku, NP returned call with new orders received.  Will continue to monitor.

## 2015-02-21 NOTE — Progress Notes (Signed)
Pt heart rate increased nonsustained 140s, sustaining 90-110.  Pt also had ?10 beats NSVT in V lead on telemetry while sleeping.  Strip saved in [facility] for MD review.  Pt awakened and denied c/o, VSS.  AM dose diltiazem and metoprolol given per orders. Potassium level 6/14 3.0.  Sharrell Ku, NP text paged.  Awaiting return call.  Will continue to monitor.

## 2015-02-21 NOTE — Discharge Summary (Signed)
Discharge summary job [phone] [id number]

## 2015-02-21 NOTE — Progress Notes (Addendum)
Inpatient Rehabilitation   I visited the patient and daughters Jeani Hawking and [name] at the bedside to discuss possible return to [facility] when MD is agreeable.  I have placed a call to V. Bhagat, PA to determine pt's readiness from cardiology standpoint.  I await their response.  I will update the acute team when I have heard back.  Please call if questions.  [place] Admissions Coordinator Cell [phone] Office [phone]    1138:  Per Ferol Luz, PA, Dr. Radford Pax is not ready for pt. To return to IP Rehab today, stating she will likely be ready tomorrow.  I have updated Chriss Driver Bigelow,CM as well as pt's daughter Jeani Hawking.  I will follow up tomorrow for tentative admission pending bed availability and medical readiness.  Please call if questions.  [place] Admissions Coordinator Cell [phone]

## 2015-02-21 NOTE — Discharge Summary (Signed)
NAME:  [name], [name] NO.:  [id number]  MEDICAL RECORD NO.:  [id number]  LOCATION:  4M03C                        FACILITY:  [place]  PHYSICIAN:  [name], M.D.DATE OF BIRTH:  Jan 16, 1935  DATE OF ADMISSION:  02/14/2015 DATE OF DISCHARGE:  02/20/2015                             HISTORY & PHYSICAL   DISCHARGE DIAGNOSES: 1. Functional deficits secondary to right cerebellar hemorrhage,     status post craniotomy, evacuation of hematoma. 2. Subcutaneous heparin for deep venous thrombosis prophylaxis. 3. Atrial fibrillation with rapid ventricular rate. 4. Diastolic congestive heart failure. 5. Chronic obstructive pulmonary disease. 6. Recurrent bladder papillary carcinoma. 7. Hyperlipidemia.  HISTORY OF PRESENT ILLNESS:  This is a 80 year old right-handed female with history of atrial fibrillation maintained on aspirin therapy who lives with her husband, independent prior to admission.  Presented February 05, 2015, with dizziness, nausea, and blurred vision.  Denied any recent fall.  CT of the head showed a 4-cm acute hemorrhage right cerebellar hemisphere, surrounding edema.  Underwent right suboccipital craniotomy evacuation of hematoma.  February 05, 2015, per Dr. Orson Slick.  Hospital course, pain management.  Subcutaneous heparin initiated for DVT prophylaxis, February 06, 2015.  Followup cranial CT scan showed a small amount of mild hydrocephalus, maintained on 3% saline to decrease cerebral edema.  Cardiology followup for transient atrial fibrillation. Placed on Cardizem.  Not a candidate for anticoagulation due to hemorrhage.  Echocardiogram with ejection fraction of 70%.  Patient was admitted for a comprehensive rehab program.  PAST MEDICAL HISTORY:  See discharge diagnoses.  SOCIAL HISTORY:  Lives with spouse, independent prior to admission. Functional status upon admission to rehab services was +2.  Physical assist, sit to stand.  Mod to max assist,  activities of daily living.  PHYSICAL EXAMINATION:  VITAL SIGNS:  Blood pressure 150/88, pulse 103, temperature 98, and respirations 18. GENERAL:  This was an alert female.  Flat affect.  She could name her age and date of birth.  Fair awareness of deficits. LUNGS:  Clear to auscultation. CARDIAC:  Rate controlled. ABDOMEN:  Soft and nontender.  Good bowel sounds.  REHABILITATION/HOSPITAL COURSE:  Patient was admitted to Inpatient Rehab Services with therapies initiated on a 3-hour daily basis consisting of physical therapy, occupational therapy, speech therapy, and rehabilitation nursing.  The following issues were addressed during the patient's rehabilitation stay.  Pertaining [name]'s right cerebellar hemorrhage, she had undergone craniotomy evacuation of hematoma.  Surgical site healing nicely.  Follow up with Dr. Orson Slick. She remained on subcutaneous heparin for DVT prophylaxis, initiated February 06, 2015.  She was on a regular diet.  Noted on February 20, 2015, heart rate variable 25 to 200.  She had been followed by Cardiology Services for transient atrial fibrillation and RVR.  Not a candidate for anticoagulation due to hemorrhage.  Due to these ongoing variations in heart rate, she was discharged to Acute Care Services.  She remained on Cardizem as she had been on prior to the admission to the rehab unit. Adjustments made as per Cardiology Services.  Condition guarded at time of transfer.  All medication changes made as per Cardiology Services.     Daniel Angiulli, P.A.  ______________________________ [name], M.D.    DA/MEDQ  D:  02/21/2015  T:  02/21/2015  Job:  [id number]  cc:   [name], M.D.

## 2015-02-21 NOTE — Progress Notes (Signed)
SUBJECTIVE:  No complaints, anxious to get back to rehab  OBJECTIVE:   Vitals:   Filed Vitals:   02/20/15 1450 02/20/15 1744 02/20/15 2100 02/21/15 0500  BP: 108/66 113/54 99/66 126/68  Pulse: 90  109 96  Temp: 98.3 F (36.8 C)  98.1 F (36.7 C) 98 F (36.7 C)  TempSrc: Oral     Resp: _0 Weight:    160 lb 4.8 oz (72.712 kg)  SpO2: 96%  98% 100%   I&O's:   Intake/Output Summary (Last 24 hours) at 02/21/15 0908 Last data filed at 02/21/15 7981  Gross per 24 hour  Intake    200 ml  Output    200 ml  Net      0 ml   TELEMETRY: Reviewed telemetry pt in NSR:     PHYSICAL EXAM General: Well developed, well nourished, in no acute distress Head: Eyes PERRLA, No xanthomas.   Normal cephalic and atramatic  Lungs:   Clear bilaterally to auscultation and percussion. Heart:   HRRR S1 S2 Pulses are 2+ & equal. Abdomen: Bowel sounds are positive, abdomen soft and non-tender without masse Extremities:   No clubbing, cyanosis or edema.  DP +1 Neuro: Alert and oriented X 3. Psych:  Good affect, responds appropriately   LABS: Basic Metabolic Panel:  Recent Labs  02/21/15 0429  NA 139  K 3.8  CL 100*  CO2 32  GLUCOSE 91  BUN 13  CREATININE 0.54  CALCIUM 8.5*  MG 2.1   Liver Function Tests: No results for input(s): AST, ALT, ALKPHOS, BILITOT, PROT, ALBUMIN in the last 72 hours. No results for input(s): LIPASE, AMYLASE in the last 72 hours. CBC: No results for input(s): WBC, NEUTROABS, HGB, HCT, MCV, PLT in the last 72 hours. Cardiac Enzymes: No results for input(s): CKTOTAL, CKMB, CKMBINDEX, TROPONINI in the last 72 hours. BNP: Invalid input(s): POCBNP D-Dimer: No results for input(s): DDIMER in the last 72 hours. Hemoglobin A1C: No results for input(s): HGBA1C in the last 72 hours. Fasting Lipid Panel: No results for input(s): CHOL, HDL, LDLCALC, TRIG, CHOLHDL, LDLDIRECT in the last 72 hours. Thyroid Function Tests: No results for input(s): TSH,  T4TOTAL, T3FREE, THYROIDAB in the last 72 hours.  Invalid input(s): FREET3 Anemia Panel: No results for input(s): VITAMINB12, FOLATE, FERRITIN, TIBC, IRON, RETICCTPCT in the last 72 hours. Coag Panel:   Lab Results  Component Value Date   INR 1.06 02/05/2015   INR 1.9* 03/30/2009   INR 2.3* 03/29/2009    RADIOLOGY: Ct Head Wo Contrast  02/13/2015   CLINICAL DATA:  Hemorrhagic stroke, follow-up.  EXAM: CT HEAD WITHOUT CONTRAST  TECHNIQUE: Contiguous axial images were obtained from the base of the skull through the vertex without intravenous contrast.  COMPARISON:  CT head February 10, 2015  FINDINGS: Evolving 4.3 x 3.1 cm hematoma was 4 x 9 x 3.5 cm. Surrounding low-density vasogenic edema with persistent effacement of the fourth ventricle. Partially effaced super cerebellar cistern. Anterior recess of the third ventricle is 13 mm, previously 15 mm. Mild interstitial edema. Remote RIGHT caudate body lacunar infarct. No acute large vascular territory infarct. Patchy white matter changes most consistent chronic small vessel ischemic disease.  No abnormal extra-axial fluid collections. Moderate calcific atherosclerosis of the carotid siphons. Status post bilateral ocular lens implants. Recent RIGHT retro sigmoid craniotomy. Overlying soft tissue swelling with minimal residual subcutaneous gas.  IMPRESSION: Evolving, contracting RIGHT cerebellar intraparenchymal hematoma with mild, improved obstructive hydrocephalus.   Electronically  Signed   By: Elon Alas M.D.   On: 02/13/2015 06:11   Ct Head Wo Contrast  02/10/2015   CLINICAL DATA:  Intraventricular hemorrhage with surgical evacuation.  EXAM: CT HEAD WITHOUT CONTRAST  TECHNIQUE: Contiguous axial images were obtained from the base of the skull through the vertex without intravenous contrast.  COMPARISON:  CT head half 02/08/2015  FINDINGS: Right occipital craniotomy for right cerebellar hematoma evacuation. High-density hemorrhage is slightly larger  compared with the prior study, now measuring 3.5 x 4.9 cm, compared with 2.9 x 4.6 cm previously. There is edema surrounding the hematoma. There is mass-effect on the fourth ventricle and brainstem. Mild ventricular enlargement is stable. There is mild hydrocephalus compared with the initial CT of 02/05/2015. Generalized atrophy is present.  Mild chronic microvascular ischemia. No acute ischemic infarct. No shift of the midline structures  IMPRESSION: Right cerebellar hematoma is slightly larger compared to the prior study. There is mass-effect on the fourth ventricle and brainstem and mild hydrocephalus which is stable.   Electronically Signed   By: Franchot Gallo M.D.   On: 02/10/2015 10:30   Ct Head Wo Contrast  02/08/2015   CLINICAL DATA:  80 year old female post surgical drainage of cerebellar hematoma. Follow-up. Subsequent encounter.  EXAM: CT HEAD WITHOUT CONTRAST  TECHNIQUE: Contiguous axial images were obtained from the base of the skull through the vertex without intravenous contrast.  COMPARISON:  02/06/2015.  FINDINGS: Post right suboccipital craniectomy for drainage of right-sided cerebellar parenchymal hematoma. Residual right cerebellar hematoma has changed configuration spanning over 4.6 x 2.9 cm versus prior 4.1 x 3.5 cm. Surrounding vasogenic edema and mass effect with mild downward herniation of the right cerebellar tonsil and upward herniation of the right vermis and cerebellum. Progressive mass effect upon the fourth ventricle, aqueduct and deformity of the brainstem/ quadrigeminal plate.  Interval enlargement of the lateral ventricles consistent with mild mild hydrocephalus. Clinical correlation and close follow-up recommended.  The very small anterior right posterior fossa subdural hematoma bordering the petrous bone.  Baseline mild global atrophy.  No CT evidence of large acute thrombotic infarct or mass separate from above described findings. Small vessel disease type changes are noted.   Vascular calcifications.  IMPRESSION: Interval development of mild hydrocephalus post partial drainage of right cerebellar hematoma. Clinical correlation and close CT monitoring recommended.  Change in configuration of the right cerebellar hematoma at as noted above. Surrounding vasogenic edema and mass effect including downward mild herniation of the right cerebellar tonsil and moderate upward herniation of the right vermis and cerebellum with compression of the fourth ventricle, aqueduct, brainstem and quadrigeminal plate.  These results were called by telephone at the time of interpretation on 02/08/2015 at 6:54 am to Jenny Reichmann patient's nurse who verbally acknowledged these results.   Electronically Signed   By: Genia Del M.D.   On: 02/08/2015 07:15   Ct Head Wo Contrast  02/06/2015   CLINICAL DATA:  80 year old female post evacuation right cerebellar hematoma. Dizziness with nausea. Speech difficulty with trouble writing. Subsequent exam.  EXAM: CT HEAD WITHOUT CONTRAST  TECHNIQUE: Contiguous axial images were obtained from the base of the skull through the vertex without intravenous contrast.  COMPARISON:  02/05/2015 CT.  FINDINGS: Post right occipital craniotomy for drainage of right cerebellar hematoma. Although there has been debulking of the central aspect of the hematoma, right cerebellar hematoma remains spanning over 4.1 x 3.5 cm versus prior 4.4 x 3.5 cm.  Surrounding vasogenic edema and mass effect have progressed  slightly. Compression of the fourth ventricle has progressed since the prior exam. Right cerebellar tonsil extension into the foramen magnum and upward herniation of the right cerebellum. Compression of the right aspect of the quadrigeminal plate has progressed slightly. Ventricular size including asymmetric mild prominence of the left temporal horn relatively similar to prior exam. Close follow-up recommended to exclude developing hydrocephalus.  Small amount of pneumocephalus  Remote mid  right coronal radiata infarct.  Small vessel disease type changes.  Vascular calcifications.  IMPRESSION: Post right occipital craniotomy for drainage of right cerebellar hematoma. Although there has been debulking of the central aspect of the hematoma, right cerebellar hematoma remains spanning over 4.1 x 3.5 cm versus prior 4.4 x 3.5 cm.  Surrounding vasogenic edema and mass effect have progressed slightly. Compression of the fourth ventricle has progressed since the prior exam. Right cerebellar tonsil extension into the foramen magnum and upward herniation of the right cerebellum. Compression of the right aspect of the quadrigeminal plate has progressed slightly. Ventricular size including asymmetric mild prominence of the left temporal horn relatively similar to prior exam. Patient is at risk for development of hydrocephalus. Close follow-up recommended.  These results will be called to the ordering clinician or representative by the Radiologist Assistant, and communication documented in the PACS or zVision Dashboard.   Electronically Signed   By: Genia Del M.D.   On: 02/06/2015 12:55   Ct Head Wo Contrast  02/05/2015   CLINICAL DATA:  Code stroke. Dizziness. Nausea and vomiting. Symptoms began at 6:30 a.m. Multiple episodes of emesis.  EXAM: CT HEAD WITHOUT CONTRAST  TECHNIQUE: Contiguous axial images were obtained from the base of the skull through the vertex without intravenous contrast.  COMPARISON:  None.  FINDINGS: Acute hemorrhage is present within the right cerebellum measuring 4.0 x 3.6 cm. The estimated volume is 25-30 mL. This creates significant mass effect on the fourth ventricle. There surrounding edema. There is no hydrocephalus.  A lacunar infarct in the right caudate head is remote. No acute supratentorial infarct, hemorrhage, or mass lesion is present. There is no midline shift. The ventricles are of normal size. No significant extra-axial fluid collection is present.  The paranasal  sinuses mastoid air cells are clear. The calvarium is intact.  IMPRESSION: 1. 4.0 cm acute hemorrhage within the right cerebellar hemisphere with surrounding edema and mass effect on the fourth ventricle. There is no hydrocephalus. The estimated volume is 25-30 mL. 2. Lacunar infarct of the right caudate head appears remote. 3. No acute supratentorial abnormality.  These results were called by telephone at the time of interpretation on 02/05/2015 at 10:35 am to Dr. Wallie Char, who verbally acknowledged these results.   Electronically Signed   By: San Morelle M.D.   On: 02/05/2015 10:37   Dg Chest Portable 1 View  02/05/2015   CLINICAL DATA:  Code stroke. Patient also with dizziness nausea and vomiting since this morning. History of COPD and CHF.  EXAM: PORTABLE CHEST - 1 VIEW  COMPARISON:  03/26/2013.  FINDINGS: Cardiac silhouette is normal size. No mediastinal or hilar masses or convincing adenopathy. Relatively low lung volumes. No lung consolidation or edema. No pleural effusion or pneumothorax.  Bony thorax is demineralized but grossly intact.  IMPRESSION: No acute cardiopulmonary disease.   Electronically Signed   By: Lajean Manes M.D.   On: 02/05/2015 10:33    ASSESSMENT AND PLAN   1. Persistent atrial fibrillation with ventricular rate 60-110 - rapid response called yesterday because of  machine recorded HR range between 20s-200, unclear accuracy in rehab. HR has been 60-80s after hooked up to telemetry.  No further episodes of low or high heart rate documented.  I suspect that this was a mechanical error in the BP cuff recording.   - based on BP today, will consolidate metoprolol to likely 101m BID and diltiazem to 1836mdaily.   - She currently is on Cardizem short acting 6035m6 hrs but BP is soft so will change to long acting 180m34mily to allow more stable BP - CHA2DS2-Vasc score 5 (stroke, female, age), no systemic anticoagulation given recent  hemorrhagic stroke  2. Hemorrhagic cerebellar stroke: 2 wks ago, with R sided visual and strength deficit.  3. HTN: on diltiazem and metoprolol  4. COPD   TURNSueanne Margarita  02/21/2015  9:08 AM

## 2015-02-21 NOTE — Progress Notes (Addendum)
Paged by nurse stating that patient is not on Keflex 231m TID. Of note of IM 6/19 on emperic cephalexin for UTI until C & S is back. However urine culture result on 6/18 - MULTIPLE SPECIES PRESENT. Likely contaminant.  Last dose was yesterday evening. Today is day 5. Will give two dose today and will treat for another 2 days to complete 7 days course.   Kyrie Bun, PA-C [place]

## 2015-02-22 ENCOUNTER — Inpatient Hospital Stay (HOSPITAL_COMMUNITY)
Admission: RE | Admit: 2015-02-22 | Discharge: 2015-03-01 | DRG: 065 | Disposition: A | Discharge status: Home Health | Payer: Medicare Other | Source: Intra-hospital | Attending: Physical Medicine & Rehabilitation | Admitting: Physical Medicine & Rehabilitation

## 2015-02-22 DIAGNOSIS — I4891 Unspecified atrial fibrillation: Secondary | ICD-10-CM

## 2015-02-22 DIAGNOSIS — R339 Retention of urine, unspecified: Secondary | ICD-10-CM | POA: Diagnosis present

## 2015-02-22 DIAGNOSIS — I614 Nontraumatic intracerebral hemorrhage in cerebellum: Principal | ICD-10-CM | POA: Diagnosis present

## 2015-02-22 DIAGNOSIS — Z96651 Presence of right artificial knee joint: Secondary | ICD-10-CM | POA: Diagnosis present

## 2015-02-22 DIAGNOSIS — Z9011 Acquired absence of right breast and nipple: Secondary | ICD-10-CM | POA: Diagnosis present

## 2015-02-22 DIAGNOSIS — Z7982 Long term (current) use of aspirin: Secondary | ICD-10-CM

## 2015-02-22 DIAGNOSIS — Z853 Personal history of malignant neoplasm of breast: Secondary | ICD-10-CM

## 2015-02-22 DIAGNOSIS — I69293 Ataxia following other nontraumatic intracranial hemorrhage: Secondary | ICD-10-CM | POA: Diagnosis present

## 2015-02-22 DIAGNOSIS — I481 Persistent atrial fibrillation: Secondary | ICD-10-CM | POA: Diagnosis not present

## 2015-02-22 DIAGNOSIS — E785 Hyperlipidemia, unspecified: Secondary | ICD-10-CM | POA: Diagnosis present

## 2015-02-22 DIAGNOSIS — I5032 Chronic diastolic (congestive) heart failure: Secondary | ICD-10-CM | POA: Diagnosis present

## 2015-02-22 DIAGNOSIS — R001 Bradycardia, unspecified: Secondary | ICD-10-CM

## 2015-02-22 DIAGNOSIS — E876 Hypokalemia: Secondary | ICD-10-CM | POA: Diagnosis present

## 2015-02-22 DIAGNOSIS — I480 Paroxysmal atrial fibrillation: Secondary | ICD-10-CM | POA: Diagnosis not present

## 2015-02-22 DIAGNOSIS — Z79899 Other long term (current) drug therapy: Secondary | ICD-10-CM

## 2015-02-22 DIAGNOSIS — H409 Unspecified glaucoma: Secondary | ICD-10-CM | POA: Diagnosis present

## 2015-02-22 DIAGNOSIS — J449 Chronic obstructive pulmonary disease, unspecified: Secondary | ICD-10-CM | POA: Diagnosis present

## 2015-02-22 DIAGNOSIS — C679 Malignant neoplasm of bladder, unspecified: Secondary | ICD-10-CM | POA: Diagnosis present

## 2015-02-22 DIAGNOSIS — N390 Urinary tract infection, site not specified: Secondary | ICD-10-CM | POA: Diagnosis present

## 2015-02-22 DIAGNOSIS — K219 Gastro-esophageal reflux disease without esophagitis: Secondary | ICD-10-CM | POA: Diagnosis present

## 2015-02-22 DIAGNOSIS — K5900 Constipation, unspecified: Secondary | ICD-10-CM | POA: Diagnosis present

## 2015-02-22 DIAGNOSIS — I482 Chronic atrial fibrillation, unspecified: Secondary | ICD-10-CM | POA: Diagnosis present

## 2015-02-22 DIAGNOSIS — I10 Essential (primary) hypertension: Secondary | ICD-10-CM | POA: Diagnosis present

## 2015-02-22 DIAGNOSIS — R270 Ataxia, unspecified: Secondary | ICD-10-CM

## 2015-02-22 DIAGNOSIS — I619 Nontraumatic intracerebral hemorrhage, unspecified: Secondary | ICD-10-CM | POA: Diagnosis not present

## 2015-02-22 MED ORDER — CEPHALEXIN 250 MG PO CAPS: 250.0000 mg | ORAL_CAPSULE | Freq: Three times a day (TID) | ORAL | Status: DC

## 2015-02-22 MED ORDER — METOPROLOL TARTRATE 50 MG PO TABS
75.0000 mg | ORAL_TABLET | Freq: Two times a day (BID) | ORAL | Status: DC
  Administered 2015-02-22: 75 mg via ORAL
  Filled 2015-02-22 (×8): qty 1

## 2015-02-22 MED ORDER — BETHANECHOL CHLORIDE 25 MG PO TABS
25.0000 mg | ORAL_TABLET | Freq: Three times a day (TID) | ORAL | Status: DC
  Filled 2015-02-22 (×5): qty 1

## 2015-02-22 MED ORDER — ACETAMINOPHEN 325 MG PO TABS
650.0000 mg | ORAL_TABLET | ORAL | Status: DC | PRN
  Administered 2015-02-22 – 2015-02-27 (×6): 650 mg via ORAL
  Filled 2015-02-22 (×5): qty 2

## 2015-02-22 MED ORDER — DILTIAZEM HCL ER COATED BEADS 240 MG PO CP24
240.0000 mg | ORAL_CAPSULE | Freq: Every day | ORAL | Status: DC
Start: 2015-02-22 — End: 2015-02-22
  Administered 2015-02-22: 240 mg via ORAL
  Filled 2015-02-22: qty 1

## 2015-02-22 MED ORDER — ATORVASTATIN CALCIUM 10 MG PO TABS
10.0000 mg | ORAL_TABLET | Freq: Every day | ORAL | Status: DC
  Administered 2015-02-22 – 2015-02-28 (×7): 10 mg via ORAL
  Filled 2015-02-22 (×9): qty 1

## 2015-02-22 MED ORDER — ONDANSETRON HCL 4 MG/2ML IJ SOLN: 4.0000 mg | Freq: Four times a day (QID) | INTRAMUSCULAR | Status: DC | PRN

## 2015-02-22 MED ORDER — CEPHALEXIN 250 MG/5ML PO SUSR
250.0000 mg | Freq: Three times a day (TID) | ORAL | Status: DC
  Filled 2015-02-22 (×2): qty 5

## 2015-02-22 MED ORDER — METOPROLOL TARTRATE 50 MG PO TABS: 75.0000 mg | ORAL_TABLET | Freq: Two times a day (BID) | ORAL | Status: DC

## 2015-02-22 MED ORDER — ALBUTEROL SULFATE (2.5 MG/3ML) 0.083% IN NEBU: 2.5000 mg | INHALATION_SOLUTION | Freq: Four times a day (QID) | RESPIRATORY_TRACT | Status: DC | PRN

## 2015-02-22 MED ORDER — LATANOPROST 0.005 % OP SOLN
1.0000 [drp] | Freq: Every day | OPHTHALMIC | Status: DC
  Administered 2015-02-22 – 2015-02-28 (×7): 1 [drp] via OPHTHALMIC
  Filled 2015-02-22 (×2): qty 2.5

## 2015-02-22 MED ORDER — CEPHALEXIN 250 MG PO CAPS
250.0000 mg | ORAL_CAPSULE | Freq: Three times a day (TID) | ORAL | Status: AC
  Administered 2015-02-23 (×3): 250 mg via ORAL
  Filled 2015-02-22 (×4): qty 1

## 2015-02-22 MED ORDER — DILTIAZEM HCL ER COATED BEADS 240 MG PO CP24
240.0000 mg | ORAL_CAPSULE | Freq: Every day | ORAL | Status: DC
  Administered 2015-02-23 – 2015-02-28 (×6): 240 mg via ORAL
  Filled 2015-02-22 (×8): qty 1

## 2015-02-22 MED ORDER — SORBITOL 70 % SOLN
30.0000 mL | Freq: Every day | Status: DC | PRN
  Administered 2015-02-27: 30 mL via ORAL
  Filled 2015-02-22: qty 30

## 2015-02-22 MED ORDER — DILTIAZEM HCL ER COATED BEADS 240 MG PO CP24: 240.0000 mg | ORAL_CAPSULE | Freq: Every day | ORAL | Status: DC

## 2015-02-22 MED ORDER — ONDANSETRON HCL 4 MG PO TABS: 4.0000 mg | ORAL_TABLET | Freq: Four times a day (QID) | ORAL | Status: DC | PRN

## 2015-02-22 MED ORDER — PANTOPRAZOLE SODIUM 40 MG PO TBEC
40.0000 mg | DELAYED_RELEASE_TABLET | Freq: Every day | ORAL | Status: DC
  Administered 2015-02-23 – 2015-03-01 (×7): 40 mg via ORAL
  Filled 2015-02-22 (×8): qty 1

## 2015-02-22 MED ORDER — CEPHALEXIN 250 MG/5ML PO SUSR
250.0000 mg | Freq: Three times a day (TID) | ORAL | Status: DC
  Administered 2015-02-22: 250 mg via ORAL
  Filled 2015-02-22 (×2): qty 5

## 2015-02-22 NOTE — Progress Notes (Signed)
SUBJECTIVE:  No complaints  OBJECTIVE:   Vitals:   Filed Vitals:   02/21/15 1300 02/21/15 1434 02/21/15 2100 02/22/15 0500  BP:  98/58 105/66 119/76  Pulse:  90 104 94  Temp:  97.8 F (36.6 C) 98.4 F (36.9 C) 98.1 F (36.7 C)  TempSrc:  Oral    Resp:  _0 Height: 5' 6" (1.676 m)     Weight:    159 lb 9.6 oz (72.394 kg)  SpO2:  98% 93% 98%   I&O's:   Intake/Output Summary (Last 24 hours) at 02/22/15 0912 Last data filed at 02/22/15 5910  Gross per 24 hour  Intake    560 ml  Output    375 ml  Net    185 ml   TELEMETRY: Reviewed telemetry pt in atrial fibrillation with HR 107bpm but as high as 130 bpm     PHYSICAL EXAM General: Well developed, well nourished, in no acute distress Head: Eyes PERRLA, No xanthomas.   Normal cephalic and atramatic  Lungs:   Clear bilaterally to auscultation and percussion. Heart:   Irregularly irregular S1 S2 Pulses are 2+ & equal. Abdomen: Bowel sounds are positive, abdomen soft and non-tender without masses Extremities:   No clubbing, cyanosis or edema.  DP +1 Neuro: Alert and oriented X 3. Psych:  Good affect, responds appropriately   LABS: Basic Metabolic Panel:  Recent Labs  02/21/15 0429  NA 139  K 3.8  CL 100*  CO2 32  GLUCOSE 91  BUN 13  CREATININE 0.54  CALCIUM 8.5*  MG 2.1   Liver Function Tests: No results for input(s): AST, ALT, ALKPHOS, BILITOT, PROT, ALBUMIN in the last 72 hours. No results for input(s): LIPASE, AMYLASE in the last 72 hours. CBC: No results for input(s): WBC, NEUTROABS, HGB, HCT, MCV, PLT in the last 72 hours. Cardiac Enzymes: No results for input(s): CKTOTAL, CKMB, CKMBINDEX, TROPONINI in the last 72 hours. BNP: Invalid input(s): POCBNP D-Dimer: No results for input(s): DDIMER in the last 72 hours. Hemoglobin A1C: No results for input(s): HGBA1C in the last 72 hours. Fasting Lipid Panel: No results for input(s): CHOL, HDL, LDLCALC, TRIG, CHOLHDL, LDLDIRECT in the last 72  hours. Thyroid Function Tests: No results for input(s): TSH, T4TOTAL, T3FREE, THYROIDAB in the last 72 hours.  Invalid input(s): FREET3 Anemia Panel: No results for input(s): VITAMINB12, FOLATE, FERRITIN, TIBC, IRON, RETICCTPCT in the last 72 hours. Coag Panel:   Lab Results  Component Value Date   INR 1.06 02/05/2015   INR 1.9* 03/30/2009   INR 2.3* 03/29/2009    RADIOLOGY: Ct Head Wo Contrast  02/13/2015   CLINICAL DATA:  Hemorrhagic stroke, follow-up.  EXAM: CT HEAD WITHOUT CONTRAST  TECHNIQUE: Contiguous axial images were obtained from the base of the skull through the vertex without intravenous contrast.  COMPARISON:  CT head February 10, 2015  FINDINGS: Evolving 4.3 x 3.1 cm hematoma was 4 x 9 x 3.5 cm. Surrounding low-density vasogenic edema with persistent effacement of the fourth ventricle. Partially effaced super cerebellar cistern. Anterior recess of the third ventricle is 13 mm, previously 15 mm. Mild interstitial edema. Remote RIGHT caudate body lacunar infarct. No acute large vascular territory infarct. Patchy white matter changes most consistent chronic small vessel ischemic disease.  No abnormal extra-axial fluid collections. Moderate calcific atherosclerosis of the carotid siphons. Status post bilateral ocular lens implants. Recent RIGHT retro sigmoid craniotomy. Overlying soft tissue swelling with minimal residual subcutaneous gas.  IMPRESSION: Evolving, contracting  RIGHT cerebellar intraparenchymal hematoma with mild, improved obstructive hydrocephalus.   Electronically Signed   By: Elon Alas M.D.   On: 02/13/2015 06:11   Ct Head Wo Contrast  02/10/2015   CLINICAL DATA:  Intraventricular hemorrhage with surgical evacuation.  EXAM: CT HEAD WITHOUT CONTRAST  TECHNIQUE: Contiguous axial images were obtained from the base of the skull through the vertex without intravenous contrast.  COMPARISON:  CT head half 02/08/2015  FINDINGS: Right occipital craniotomy for right cerebellar  hematoma evacuation. High-density hemorrhage is slightly larger compared with the prior study, now measuring 3.5 x 4.9 cm, compared with 2.9 x 4.6 cm previously. There is edema surrounding the hematoma. There is mass-effect on the fourth ventricle and brainstem. Mild ventricular enlargement is stable. There is mild hydrocephalus compared with the initial CT of 02/05/2015. Generalized atrophy is present.  Mild chronic microvascular ischemia. No acute ischemic infarct. No shift of the midline structures  IMPRESSION: Right cerebellar hematoma is slightly larger compared to the prior study. There is mass-effect on the fourth ventricle and brainstem and mild hydrocephalus which is stable.   Electronically Signed   By: Franchot Gallo M.D.   On: 02/10/2015 10:30   Ct Head Wo Contrast  02/08/2015   CLINICAL DATA:  80 year old female post surgical drainage of cerebellar hematoma. Follow-up. Subsequent encounter.  EXAM: CT HEAD WITHOUT CONTRAST  TECHNIQUE: Contiguous axial images were obtained from the base of the skull through the vertex without intravenous contrast.  COMPARISON:  02/06/2015.  FINDINGS: Post right suboccipital craniectomy for drainage of right-sided cerebellar parenchymal hematoma. Residual right cerebellar hematoma has changed configuration spanning over 4.6 x 2.9 cm versus prior 4.1 x 3.5 cm. Surrounding vasogenic edema and mass effect with mild downward herniation of the right cerebellar tonsil and upward herniation of the right vermis and cerebellum. Progressive mass effect upon the fourth ventricle, aqueduct and deformity of the brainstem/ quadrigeminal plate.  Interval enlargement of the lateral ventricles consistent with mild mild hydrocephalus. Clinical correlation and close follow-up recommended.  The very small anterior right posterior fossa subdural hematoma bordering the petrous bone.  Baseline mild global atrophy.  No CT evidence of large acute thrombotic infarct or mass separate from above  described findings. Small vessel disease type changes are noted.  Vascular calcifications.  IMPRESSION: Interval development of mild hydrocephalus post partial drainage of right cerebellar hematoma. Clinical correlation and close CT monitoring recommended.  Change in configuration of the right cerebellar hematoma at as noted above. Surrounding vasogenic edema and mass effect including downward mild herniation of the right cerebellar tonsil and moderate upward herniation of the right vermis and cerebellum with compression of the fourth ventricle, aqueduct, brainstem and quadrigeminal plate.  These results were called by telephone at the time of interpretation on 02/08/2015 at 6:54 am to Jenny Reichmann patient's nurse who verbally acknowledged these results.   Electronically Signed   By: Genia Del M.D.   On: 02/08/2015 07:15   Ct Head Wo Contrast  02/06/2015   CLINICAL DATA:  80 year old female post evacuation right cerebellar hematoma. Dizziness with nausea. Speech difficulty with trouble writing. Subsequent exam.  EXAM: CT HEAD WITHOUT CONTRAST  TECHNIQUE: Contiguous axial images were obtained from the base of the skull through the vertex without intravenous contrast.  COMPARISON:  02/05/2015 CT.  FINDINGS: Post right occipital craniotomy for drainage of right cerebellar hematoma. Although there has been debulking of the central aspect of the hematoma, right cerebellar hematoma remains spanning over 4.1 x 3.5 cm versus prior 4.4  x 3.5 cm.  Surrounding vasogenic edema and mass effect have progressed slightly. Compression of the fourth ventricle has progressed since the prior exam. Right cerebellar tonsil extension into the foramen magnum and upward herniation of the right cerebellum. Compression of the right aspect of the quadrigeminal plate has progressed slightly. Ventricular size including asymmetric mild prominence of the left temporal horn relatively similar to prior exam. Close follow-up recommended to exclude  developing hydrocephalus.  Small amount of pneumocephalus  Remote mid right coronal radiata infarct.  Small vessel disease type changes.  Vascular calcifications.  IMPRESSION: Post right occipital craniotomy for drainage of right cerebellar hematoma. Although there has been debulking of the central aspect of the hematoma, right cerebellar hematoma remains spanning over 4.1 x 3.5 cm versus prior 4.4 x 3.5 cm.  Surrounding vasogenic edema and mass effect have progressed slightly. Compression of the fourth ventricle has progressed since the prior exam. Right cerebellar tonsil extension into the foramen magnum and upward herniation of the right cerebellum. Compression of the right aspect of the quadrigeminal plate has progressed slightly. Ventricular size including asymmetric mild prominence of the left temporal horn relatively similar to prior exam. Patient is at risk for development of hydrocephalus. Close follow-up recommended.  These results will be called to the ordering clinician or representative by the Radiologist Assistant, and communication documented in the PACS or zVision Dashboard.   Electronically Signed   By: Genia Del M.D.   On: 02/06/2015 12:55   Ct Head Wo Contrast  02/05/2015   CLINICAL DATA:  Code stroke. Dizziness. Nausea and vomiting. Symptoms began at 6:30 a.m. Multiple episodes of emesis.  EXAM: CT HEAD WITHOUT CONTRAST  TECHNIQUE: Contiguous axial images were obtained from the base of the skull through the vertex without intravenous contrast.  COMPARISON:  None.  FINDINGS: Acute hemorrhage is present within the right cerebellum measuring 4.0 x 3.6 cm. The estimated volume is 25-30 mL. This creates significant mass effect on the fourth ventricle. There surrounding edema. There is no hydrocephalus.  A lacunar infarct in the right caudate head is remote. No acute supratentorial infarct, hemorrhage, or mass lesion is present. There is no midline shift. The ventricles are of normal size. No  significant extra-axial fluid collection is present.  The paranasal sinuses mastoid air cells are clear. The calvarium is intact.  IMPRESSION: 1. 4.0 cm acute hemorrhage within the right cerebellar hemisphere with surrounding edema and mass effect on the fourth ventricle. There is no hydrocephalus. The estimated volume is 25-30 mL. 2. Lacunar infarct of the right caudate head appears remote. 3. No acute supratentorial abnormality.  These results were called by telephone at the time of interpretation on 02/05/2015 at 10:35 am to Dr. Wallie Char, who verbally acknowledged these results.   Electronically Signed   By: San Morelle M.D.   On: 02/05/2015 10:37   Dg Chest Portable 1 View  02/05/2015   CLINICAL DATA:  Code stroke. Patient also with dizziness nausea and vomiting since this morning. History of COPD and CHF.  EXAM: PORTABLE CHEST - 1 VIEW  COMPARISON:  03/26/2013.  FINDINGS: Cardiac silhouette is normal size. No mediastinal or hilar masses or convincing adenopathy. Relatively low lung volumes. No lung consolidation or edema. No pleural effusion or pneumothorax.  Bony thorax is demineralized but grossly intact.  IMPRESSION: No acute cardiopulmonary disease.   Electronically Signed   By: Lajean Manes M.D.   On: 02/05/2015 10:33    ASSESSMENT AND PLAN   1. Persistent atrial  fibrillation- HR controlled when when sleeping but when awake is up as high as 120-130's.  Will increase Cardizem to 249m daily.  CHA2DS2-Vasc score 5 (stroke, female, age), no systemic anticoagulation given recent hemorrhagic stroke.  If HR improves on higher dose of Cardizem then ok to proceed with transfer to rehab.  2. Hemorrhagic cerebellar stroke: 2 wks ago, with R sided visual and strength deficit.  3. HTN: controlled on diltiazem and metoprolol  4. COPD    TSueanne Margarita MD  02/22/2015  9:12 AM

## 2015-02-22 NOTE — Progress Notes (Signed)
Susan Staggers, MD Physician Signed Physical Medicine and Rehabilitation PMR Pre-admission 02/22/2015 1:33 PM  Related encounter: Admission (Current) from 02/20/2015 in [place] [facility]apse All   PMR Admission Coordinator Pre-Admission Assessment  Patient: [name] is an 80 y.o., female MRN: [id number] DOB: 10-03-1934 Height: _0  (167.6 cm) Weight: 72.394 kg (159 lb 9.6 oz)  Insurance Information HMO: No PPO: PCP: IPA: 80/20: OTHER:  PRIMARY: Medicare A and B Policy#: [id number] Subscriber: Self CM Name: Phone#: Fax#:  Pre-Cert#: Employer: retired Benefits: Phone #: Name:  Eff. Date: 06/03/02 Deduct: $1288 Out of Pocket Max: none Life Max: unlimited CIR: 100% SNF: 100 days Outpatient: 80% Co-Pay: 20% Home Health: 100% Co-Pay: none DME: 80% Co-Pay: 20% Providers: pt. choice SECONDARY: BCBS Supplement Policy#: [id number] Subscriber: self CM Name: Phone#: Fax#:  Pre-Cert#: Employer:  Benefits: Phone #: [phone] Name:  Eff. 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 right handed female with history of hypertension, transient atrial  fibrillation maintained on aspirin, diastolic congestive heart failure, COPD, recurrent bladder papillary carcinoma as well as breast cancer with mastectomy. Patient lives with husband and independent prior to admission. Presented 02/05/2015 with dizziness and nausea/vomiting as well as bouts of blurred vision. Denies any recent fall or head trauma. CT of the head showed a 4 cm acute hemorrhage within the right cerebellar hemisphere with surrounding edema and mass effect on the fourth ventricle as well as lacunar infarct of the right caudate head appeared to be remote. Underwent right retrosigmoid suboccipital craniotomy, evacuation of hematoma 02/05/2015 per Dr. Kathyrn Sheriff. Hospital course blood pressure management. Subcutaneous heparin was later initiated for DVT prophylaxis 02/06/2015. Follow-up cranial CT scans shows that right cerebellar hematoma remains spanning over a 4.1 x 3.5 cm versus 4.4 x 3.5 cm as well as surrounding vasogenic edema mildly progressed. A follow-up cranial CT scan for 02/08/2015 with a small amount of mild hydrocephalus advised monitoring and unchanged with repeat scan 02/13/2015. Placed on 3% saline to decrease any cerebral edema and later discontinued.. Cardiology consulted 02/08/2015 for a noted history of transient atrial fibrillation had been on aspirin prior to admission patient with bouts of A. fib RVR and was placed on Cardizem with rate controlled. She was not a candidate for systemic anticoagulation given her hemorrhage. Echocardiogram with ejection fraction 50% grade 1 diastolic dysfunction. Patient is on a regular consistency diet physical and occupational therapy evaluations completed and ongoing with recommendations of physical medicine rehabilitation consult. Patient was admitted for comprehensive rehabilitation program 02/14/2015 and was progressing nicely requiring moderate assist for transfers minimal assist for hygiene. 02/20/2015 with variations in heart rate and  follow-up with rapid response. Heart rate showing to be 20s to 200 on Dinamap however on telemetry 60-80. Due to persistent atrial fibrillation RVR was discharged to acute care services for cardiology follow-up. Patient's Cardizem was adjusted to 240 mg daily as well as maintain Lopressor 75 mg twice a day. Cardiac rate control. Bouts of urinary retention with urecholine added 25 mg 3 times a  day on 02/20/2015. Patient cleared by cardiology services and was readmitted for a comprehensive rehabilitation program      Past Medical History  Past Medical History  Diagnosis Date  . History of CHF (congestive heart failure)     POST SURG 2010  . COPD (chronic obstructive pulmonary disease)   . 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 Glaucoma of both eyes   . History of atrial fibrillation without current medication 2010    POST SURG 2010-- PER PT NO ISSUES SINCE  . Arthritis   . Dyspnea on exertion   . GERD (gastroesophageal reflux disease)   . Nephrolithiasis   . History of shingles     02/ 2015-- back of neck and left flank-- no residual pain  . History of hypertension   . Recurrent bladder papillary carcinoma first dx 06/ 2014    s/p turbt's and instillation mitomycin c (chemo)  . ICH (intracerebral hemorrhage) 02/05/2015    Family History  family history includes Breast cancer (age of onset: 48) in her maternal aunt; Breast cancer (age of onset: 63) in her daughter; Breast cancer (age of onset: 54) in her other; Cancer in her maternal aunt; Colon cancer in her maternal grandmother; Colon cancer (age of onset: 3) in her mother.  Prior Rehab/Hospitalizations:  Has the patient had major surgery during 100 days prior to admission? No surgery in last 100 days; pt. On IP rehab from 6/13-6/19/16  Current Medications   Current facility-administered medications:   . acetaminophen (TYLENOL) tablet 650 mg, 650 mg, Oral, Q4H PRN, [name], PA, 650 mg at 02/22/15 4580 . albuterol (PROVENTIL) (2.5 MG/3ML) 0.083% nebulizer solution 2.5 mg, 2.5 mg, Nebulization, Q6H PRN, Deboraha Sprang, MD . atorvastatin (LIPITOR) tablet 10 mg, 10 mg, Oral, q1800, [name], PA, 10 mg at 02/21/15 1813 . bethanechol (URECHOLINE) tablet 25 mg, 25 mg, Oral, TID, [name], PA, 25 mg at 02/21/15 2238 . cephALEXin (KEFLEX) capsule 250 mg, 250 mg, Oral, 3 times per day, Bhavinkumar Bhagat, PA, 250 mg at 02/22/15 0630 . diltiazem (CARDIZEM CD) 24 hr capsule 240 mg, 240 mg, Oral, Daily, Sueanne Margarita, MD, 240 mg at 02/22/15 1058 . latanoprost (XALATAN) 0.005 % ophthalmic solution 1 drop, 1 drop, Both Eyes, QHS, [place], [place], 1 drop at 02/21/15 2237 . metoprolol tartrate (LOPRESSOR) tablet 75 mg, 75 mg, Oral, BID, Sueanne Margarita, MD, 75 mg at 02/22/15 1058 . ondansetron (ZOFRAN) injection 4 mg, 4 mg, Intravenous, Q6H PRN, [name], PA . pantoprazole (PROTONIX) EC tablet 40 mg, 40 mg, Oral, Daily, [name], [place], 40 mg at 02/22/15 1059  Patients Current Diet: Diet Heart Room service appropriate?: Yes; Fluid consistency:: Thin  Precautions / Restrictions Restrictions Weight Bearing Restrictions: No   Has the patient had 2 or more falls or a fall with injury in the past year?No  Prior Activity Level Community ; pt. Went out daily    Development worker, international aid / [place] Devices/Equipment: Environmental consultant (specify type), Bedside commode/3-in-1  Prior Device Use: Indicate devices/aids used by the patient prior to current illness, exacerbation or injury? None of the above; did not use cane or other device PTA  Prior Functional Level    Self Care: Did the patient need help bathing, dressing, using the toilet or eating? Independent  Indoor Mobility: Did the patient need assistance with walking from room to room (with or without device)?  Independent  Stairs:  Did the patient need assistance with internal or external stairs (with or without device)? Independent  Functional Cognition: Did the patient need help planning regular tasks such as shopping or remembering to take medications? Independent  Current Functional Level Cognition  Orientation Level: Oriented X4   Extremity Assessment (includes Sensation/Coordination)          ADLs       Mobility       Transfers       Ambulation / Gait / Stairs / Wheelchair Mobility       Posture / Balance      Special needs/care consideration BiPAP/CPAP no Continuous Drip IV no  Oxygen no Special Bed no  Skin Bruising on forearms  Bowel mgmt: last BM 02/21/15 Bladder mgmt: continent , with assist Diabetic mgmt no     Previous Home Environment Living Arrangements: Spouse/significant other, Children Lives With: Spouse, Son Available Help at Discharge: Family, Available 24 hours/day Type of Home: House Home Layout: Two level, Able to live on main level with bedroom/bathroom Alternate Level Stairs-Rails: Right, Left Alternate Level Stairs-Number of Steps: flight Home Access: Stairs to enter Entrance Stairs-Rails: None Entrance Stairs-Number of Steps: 6 2-inch stairs (2 from driveway to walkway, 2 into home, 2 from entry way into living room) [employer] Shower/Tub: Gaffer, Door [employer] Toilet: Handicapped height Bathroom Accessibility: Yes How Accessible: Accessible via walker [place]: No  Discharge Living Setting Plans for Discharge Living Setting: Patient's home, House, Lives with (comment) Type of Home at Discharge: House Discharge Home Layout: Two level, Able to live on main level with bedroom/bathroom Alternate Level Stairs-Number of Steps: Flight Discharge Home Access: Stairs to enter Technical brewer of Steps: 2 Discharge Bathroom Shower/Tub: Walk-in shower, Door Discharge  Bathroom Toilet: Handicapped height Discharge Bathroom Accessibility: Yes How Accessible: Accessible via walker Does the patient have any problems obtaining your medications?: No  Social/Family/Support Systems Patient Roles: Spouse, Parent Contact Information: Tommy Minichiello - spouse [phone] Anticipated Caregiver: Son, husband and daughter's Ability/Limitations of Caregiver: Husband works Biochemist, clinical, son Radiation protection practitioner and daughter's will adjust their schedules to assist Caregiver Availability: 24/7 Discharge Plan Discussed with Primary Caregiver: Yes Is Caregiver In Agreement with Plan?: Yes Does Caregiver/Family have Issues with Lodging/Transportation while Pt is in Rehab?: No   Goals/Additional Needs Patient/Family Goal for Rehab: PT/OT supervision to min assist; SLP supervision goals Expected length of stay: 9-13 days Cultural Considerations: None Dietary Needs: heart healthy, thin liquids Equipment Needs: TBD Pt/Family Agrees to Admission and willing to participate: Yes Program Orientation Provided & Reviewed with Pt/Caregiver Including Roles & Responsibilities: Yes   Decrease burden of Care through IP rehab admission: n/a   Possible need for SNF placement upon discharge: not anticipated   Patient Condition: This patient's medical and functional status has changed since the consult dated: 02/08/15 in which the Rehabilitation Physician determined and documented that the patient's condition is appropriate for intensive rehabilitative care in an inpatient rehabilitation facility. See "History of Present Illness" (above) for medical update. Functional changes are: pt is transferring with +2 mod assist for [facility] transfers. Patient's medical and functional status update has been discussed with the Rehabilitation physician and patient remains appropriate for inpatient rehabilitation. Will admit to inpatient rehab today.  Preadmission Screen Completed By: Gerlean Ren,  02/22/2015 2:33 PM ______________________________________________________________________  Discussed status with Dr. Naaman Plummer on 02/22/15 at 1429 and received telephone approval for admission today.  Admission Coordinator: Gerlean Ren, time 1429 Sudie Grumbling 02/22/15

## 2015-02-22 NOTE — Progress Notes (Signed)
Pt admitted from 3W 2  A 79 yrs old female who was previously admitted to this unit and later transfer to 3w due to bradycardia on the 6/19. Pt Dx of  ICH,s/p Rt  Sub- Occipital craniotomy for evacuation of hematoma. Pt Awake ,alert and oriented x 4 . Oriented to room and safety measures and valuables that need not be at bedside with  at  discussed with pt and daughter. Both verbalized good understanding. Call  Bell within reach for assistance as needed,.  VS stable   Pt denied pain at present . No acute distress. RN will continue to monitor pt.

## 2015-02-22 NOTE — Discharge Summary (Signed)
Discharge Summary   Patient ID: [name],  MRN: [id number], DOB/AGE: [birth year] 80 y.o.  Admit date: 02/20/2015 Discharge date: 02/22/2015  Primary Care Provider: [name] Primary Cardiologist: Adora Fridge, MD   Discharge Diagnoses Principal Problem:   Possible Bradycardia Active Problems:   Atrial fibrillation   Chronic combined systolic and diastolic congestive heart failure   Benign essential HTN   HLD (hyperlipidemia)   ICH (intracerebral hemorrhage)  Allergies Allergies  Allergen Reactions  . Morphine And Related Nausea And Vomiting   Procedures  None  History of Present Illness  80 y/o female with a h/o HTN s/p cerebellar hemorrhage on 04/09/6810 complicated by afib with RVR.  In the setting of intracerebral bleeding, she was not deemed to be a suitable oral anticoagulation candidate and thus she was rate controlled with beta blocker and diltiazem therapy.  She was transferred to inpatient rehab on 6/13 and was progressing well.  On 6/19 however, she had VS performed via a dynamap, and a HR in the 20's was recorded.  Pt was asymptomatic and hemodynamically stable.  Cardiology was called to re-evaluate and decision was made to admit her to the inpatient service for further heart rate evaluation.  Hospital Course  Following admission, pt has remained in atrial fibrillation with rates in the 60's to 100's @ rest, with rises into the 130's with standing or activity.  She did not have any bradycardia or extreme tachycardia.  She has remained asymptomatic.  We have consolidated her metoprolol and diltiazem therapy and HR today is in the 70's.  It is felt that HR recordings leading to admission were likely inaccurate.  She will be discharged back to inpatient rehab today in good condition.  Discharge Vitals Blood pressure 119/76, pulse 94, temperature 98.1 F (36.7 C), temperature source Oral, resp. rate 18, height _0  (1.676 m), weight 159 lb 9.6 oz (72.394 kg), last  menstrual period 12/03/2002, SpO2 98 %.  Filed Weights   02/21/15 0500 02/22/15 0500  Weight: 160 lb 4.8 oz (72.712 kg) 159 lb 9.6 oz (72.394 kg)    Labs  Basic Metabolic Panel  Recent Labs  02/21/15 0429  NA 139  K 3.8  CL 100*  CO2 32  GLUCOSE 91  BUN 13  CREATININE 0.54  CALCIUM 8.5*  MG 2.1   Disposition  Pt is being discharged home today in good condition.  Follow-up Plans & Appointments  TBD @ the time of d/c from inpatient rehab.  Discharge Medications    Medication List    STOP taking these medications        diltiazem 60 MG tablet  Commonly known as:  CARDIZEM      TAKE these medications        amoxicillin 500 MG capsule  Commonly known as:  AMOXIL  Prior to dentist appointment     atorvastatin 10 MG tablet  Commonly known as:  LIPITOR  Take 1 tablet (10 mg total) by mouth daily at 6 PM.     bethanechol 25 MG tablet  Commonly known as:  URECHOLINE  Take 1 tablet (25 mg total) by mouth 3 (three) times daily.     cephALEXin 250 MG capsule  Commonly known as:  KEFLEX  Take 1 capsule (250 mg total) by mouth every 8 (eight) hours.     diltiazem 240 MG 24 hr capsule  Commonly known as:  CARDIZEM CD  Take 1 capsule (240 mg total) by mouth daily.     latanoprost  0.005 % ophthalmic solution  Commonly known as:  XALATAN  Place 1 drop into both eyes at bedtime.     levalbuterol 45 MCG/ACT inhaler  Commonly known as:  XOPENEX HFA  Inhale 1-2 puffs into the lungs every 4 (four) hours as needed for wheezing.     metoprolol 50 MG tablet  Commonly known as:  LOPRESSOR  Take 1.5 tablets (75 mg total) by mouth 2 (two) times daily.     pantoprazole 40 MG tablet  Commonly known as:  PROTONIX  Take 40 mg by mouth daily.     SIMBRINZA 1-0.2 % Susp  Generic drug:  Brinzolamide-Brimonidine  Place 1 drop into both eyes 2 (two) times daily.       Outstanding Labs/Studies  None  Duration of Discharge Encounter   Greater than 30 minutes  including physician time.  Signed, Murray Hodgkins NP 02/22/2015, 2:20 PM

## 2015-02-22 NOTE — PMR Pre-admission (Signed)
PMR Admission Coordinator Pre-Admission Assessment  Patient: [name] is an 80 y.o., female MRN: [id number] DOB: 10-Jun-1935 Height: _0  (167.6 cm) Weight: 72.394 kg (159 lb 9.6 oz)              Insurance Information HMO: No   PPO:      PCP:      IPA:      80/20:      OTHER:  PRIMARY:  Medicare A and B      Policy#: [id number]      Subscriber:  Self CM Name:       Phone#:      Fax#:  Pre-Cert#:       Employer: retired Benefits:  Phone #:      Name:  Eff. Date:  06/03/02     Deduct:  $1288      Out of Pocket Max:  none      Life Max:  unlimited CIR:  100%      SNF:  100 days Outpatient:  80%     Co-Pay:  20% Home Health:  100%      Co-Pay:  none DME:  80%     Co-Pay:  20% Providers: pt. choice SECONDARY: BCBS Supplement      Policy#:  [id number]      Subscriber:  self CM Name:       Phone#:      Fax#:  Pre-Cert#:       Employer:  Benefits:  Phone #:  [phone]     Name:  Eff. 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 right handed female with history of hypertension, transient atrial fibrillation maintained on aspirin, diastolic congestive heart failure, COPD, recurrent bladder papillary carcinoma as well as breast cancer with mastectomy. Patient lives with husband and independent prior to admission. Presented 02/05/2015 with dizziness and nausea/vomiting as well as bouts of blurred vision. Denies any recent fall or head trauma. CT of the head showed a 4 cm acute hemorrhage within the  right cerebellar hemisphere with surrounding edema and mass effect on the fourth ventricle as well as lacunar infarct of the right caudate head appeared to be remote. Underwent right retrosigmoid suboccipital craniotomy, evacuation of hematoma 02/05/2015 per Dr. Kathyrn Sheriff. Hospital course blood pressure management. Subcutaneous heparin was later initiated for DVT prophylaxis 02/06/2015. Follow-up cranial CT scans shows that right cerebellar hematoma remains spanning over a 4.1 x 3.5 cm versus 4.4 x 3.5 cm as well as surrounding vasogenic edema mildly progressed. A follow-up cranial CT scan for 02/08/2015 with a small amount of mild hydrocephalus advised monitoring and unchanged with repeat scan 02/13/2015. Placed on 3% saline to decrease any cerebral edema and later discontinued.. Cardiology consulted 02/08/2015 for a noted history of transient atrial fibrillation had been on aspirin prior to admission patient with bouts of  A. fib RVR and was placed on Cardizem with rate controlled. She was not a candidate for systemic anticoagulation given her hemorrhage. Echocardiogram with ejection fraction 40% grade 1 diastolic dysfunction. Patient is on a regular consistency diet physical and occupational therapy evaluations completed and ongoing with recommendations of physical medicine rehabilitation consult. Patient was admitted for comprehensive rehabilitation program 02/14/2015 and was progressing nicely requiring moderate assist for transfers minimal assist for hygiene. 02/20/2015 with variations in heart rate and follow-up with rapid response. Heart rate showing to be 20s to 200 on Dinamap however on telemetry 60-80. Due to persistent atrial fibrillation RVR was discharged to acute care services for cardiology follow-up. Patient's Cardizem was adjusted to 240 mg daily as well as maintain Lopressor 75 mg twice a day. Cardiac rate control. Bouts of urinary retention with urecholine added 25 mg 3 times a day on 02/20/2015.  Patient cleared by cardiology services and was readmitted for a comprehensive rehabilitation program      Past Medical History  Past Medical History  Diagnosis Date  . History of CHF (congestive heart failure)     POST SURG 2010  . COPD (chronic obstructive pulmonary disease)   . History of breast cancer     DX DCIS IN 2004--  S/P RIGHT MASTECTOMY , NO CHEMORADIATION---  NO RECURRENCE  . Glaucoma of both eyes   . History of atrial fibrillation without current medication 2010    POST SURG 2010--  PER PT NO ISSUES SINCE  . Arthritis   . Dyspnea on exertion   . GERD (gastroesophageal reflux disease)   . Nephrolithiasis   . History of shingles     02/ 2015--  back of neck and left flank--  no residual pain  . History of hypertension   . Recurrent bladder papillary carcinoma first dx 06/ 2014    s/p  turbt's  and instillation mitomycin c (chemo)  . ICH (intracerebral hemorrhage) 02/05/2015    Family History  family history includes Breast cancer (age of onset: 79) in her maternal aunt; Breast cancer (age of onset: 50) in her daughter; Breast cancer (age of onset: 58) in her other; Cancer in her maternal aunt; Colon cancer in her maternal grandmother; Colon cancer (age of onset: 65) in her mother.  Prior Rehab/Hospitalizations:  Has the patient had major surgery during 100 days prior to admission? No surgery in last 100 days; pt. On IP rehab from 6/13-6/19/16  Current Medications   Current facility-administered medications:  .  acetaminophen (TYLENOL) tablet 650 mg, 650 mg, Oral, Q4H PRN, [name], PA, 650 mg at 02/22/15 0867 .  albuterol (PROVENTIL) (2.5 MG/3ML) 0.083% nebulizer solution 2.5 mg, 2.5 mg, Nebulization, Q6H PRN, Deboraha Sprang, MD .  atorvastatin (LIPITOR) tablet 10 mg, 10 mg, Oral, q1800, [name], PA, 10 mg at 02/21/15 1813 .  bethanechol (URECHOLINE) tablet 25 mg, 25 mg, Oral, TID, [name], PA, 25 mg at 02/21/15 2238 .  cephALEXin (KEFLEX) capsule 250 mg, 250 mg,  Oral, 3 times per day, Bhavinkumar Bhagat, PA, 250 mg at 02/22/15 0630 .  diltiazem (CARDIZEM CD) 24 hr capsule 240 mg, 240 mg, Oral, Daily, Sueanne Margarita, MD, 240 mg at 02/22/15 1058 .  latanoprost (XALATAN) 0.005 % ophthalmic solution 1 drop, 1 drop, Both Eyes, QHS, [place], [place], 1 drop at 02/21/15 2237 .  metoprolol tartrate (LOPRESSOR) tablet 75 mg, 75 mg, Oral, BID, Sueanne Margarita, MD, 75 mg at 02/22/15 1058 .  ondansetron (ZOFRAN) injection 4  mg, 4 mg, Intravenous, Q6H PRN, [name], PA .  pantoprazole (PROTONIX) EC tablet 40 mg, 40 mg, Oral, Daily, [name], [place], 40 mg at 02/22/15 1059  Patients Current Diet: Diet Heart Room service appropriate?: Yes; Fluid consistency:: Thin  Precautions / Restrictions Restrictions Weight Bearing Restrictions: No   Has the patient had 2 or more falls or a fall with injury in the past year?No  Prior Activity Level  Community ; pt. Went out daily     Development worker, international aid / [place] Devices/Equipment: Environmental consultant (specify type), Bedside commode/3-in-1  Prior Device Use: Indicate devices/aids used by the patient prior to current illness, exacerbation or injury? None of the above; did not use cane or other device PTA  Prior Functional Level    Self Care: Did the patient need help bathing, dressing, using the toilet or eating?  Independent  Indoor Mobility: Did the patient need assistance with walking from room to room (with or without device)? Independent  Stairs: Did the patient need assistance with internal or external stairs (with or without device)? Independent  Functional Cognition: Did the patient need help planning regular tasks such as shopping or remembering to take medications? Independent  Current Functional Level Cognition  Orientation Level: Oriented X4    Extremity Assessment (includes Sensation/Coordination)          ADLs       Mobility       Transfers       Ambulation / Gait / Stairs / Wheelchair  Mobility       Posture / Balance      Special needs/care consideration BiPAP/CPAP no Continuous Drip IV  no        Oxygen  no Special Bed  no       Skin     Bruising on forearms                            Bowel mgmt: last BM 02/21/15 Bladder mgmt: continent , with assist Diabetic mgmt  no     Previous Home Environment Living Arrangements: Spouse/significant other, Children  Lives With: Spouse, Son Available Help at Discharge: Family, Available 24 hours/day Type of Home: House Home Layout: Two level, Able to live on main level with bedroom/bathroom Alternate Level Stairs-Rails: Right, Left Alternate Level Stairs-Number of Steps: flight Home Access: Stairs to enter Entrance Stairs-Rails: None Entrance Stairs-Number of Steps: 6 2-inch stairs (2 from driveway to walkway, 2 into home, 2 from entry way into living room) [employer] Shower/Tub: Gaffer, Door [employer] Toilet: Handicapped height Bathroom Accessibility: Yes How Accessible: Accessible via walker Chillum: No  Discharge Living Setting Plans for Discharge Living Setting: Patient's home, House, Lives with (comment) Type of Home at Discharge: House Discharge Home Layout: Two level, Able to live on main level with bedroom/bathroom Alternate Level Stairs-Number of Steps: Flight Discharge Home Access: Stairs to enter Technical brewer of Steps: 2 Discharge Bathroom Shower/Tub: Walk-in shower, Door Discharge Bathroom Toilet: Handicapped height Discharge Bathroom Accessibility: Yes How Accessible: Accessible via walker Does the patient have any problems obtaining your medications?: No  Social/Family/Support Systems Patient Roles: Spouse, Parent Contact Information: Anastaisa Wooding - spouse [phone] Anticipated Caregiver: Son, husband and daughter's Ability/Limitations of Caregiver: Husband works fulltime, son Radiation protection practitioner and daughter's will adjust their schedules to assist Caregiver  Availability: 24/7 Discharge Plan Discussed with Primary Caregiver: Yes Is Caregiver In Agreement with Plan?: Yes Does Caregiver/Family have Issues with  Lodging/Transportation while Pt is in Rehab?: No   Goals/Additional Needs Patient/Family Goal for Rehab: PT/OT supervision to min assist; SLP supervision goals Expected length of stay: 9-13 days Cultural Considerations: None Dietary Needs: heart healthy, thin liquids Equipment Needs: TBD Pt/Family Agrees to Admission and willing to participate: Yes Program Orientation Provided & Reviewed with Pt/Caregiver Including Roles  & Responsibilities: Yes   Decrease burden of Care through IP rehab admission:  n/a   Possible need for SNF placement upon discharge: not anticipated   Patient Condition: This patient's medical and functional status has changed since the consult dated: 02/08/15 in which the Rehabilitation Physician determined and documented that the patient's condition is appropriate for intensive rehabilitative care in an inpatient rehabilitation facility. See "History of Present Illness" (above) for medical update. Functional changes are: pt is transferring with +2 mod  assist for [facility] transfers. Patient's medical and functional status update has been discussed with the Rehabilitation physician and patient remains appropriate for inpatient rehabilitation. Will admit to inpatient rehab today.  Preadmission Screen Completed By:  Gerlean Ren, 02/22/2015 2:33 PM ______________________________________________________________________   Discussed status with Dr.  Naaman Plummer on 02/22/15 at  1429  and received telephone approval for admission today.  Admission Coordinator:  Gerlean Ren, time 1429 Sudie Grumbling 02/22/15

## 2015-02-22 NOTE — H&P (Signed)
Physical Medicine and Rehabilitation Admission H&P   Chief complaint:Weakness  HPI: [name] is a 80 y.o. right handed female with history of hypertension, transient atrial fibrillation maintained on aspirin, diastolic congestive heart failure, COPD, recurrent bladder papillary carcinoma as well as breast cancer with mastectomy. Patient lives with husband and independent prior to admission. Presented 02/05/2015 with dizziness and nausea/vomiting as well as bouts of blurred vision. Denies any recent fall or head trauma. CT of the head showed a 4 cm acute hemorrhage within the right cerebellar hemisphere with surrounding edema and mass effect on the fourth ventricle as well as lacunar infarct of the right caudate head appeared to be remote. Underwent right retrosigmoid suboccipital craniotomy, evacuation of hematoma 02/05/2015 per Dr. Kathyrn Sheriff. Hospital course blood pressure management. Follow-up cranial CT scans shows that right cerebellar hematoma remains spanning over a 4.1 x 3.5 cm versus 4.4 x 3.5 cm as well as surrounding vasogenic edema mildly progressed. A follow-up cranial CT scan for 02/08/2015 with a small amount of mild hydrocephalus advised monitoring and unchanged with repeat scan 02/13/2015. Placed on 3% saline to decrease any cerebral edema and later discontinued.. Cardiology consulted 02/08/2015 for a noted history of transient atrial fibrillation had been on aspirin prior to admission patient with bouts of A. fib RVR and was placed on Cardizem with rate controlled. She was not a candidate for systemic anticoagulation given her hemorrhage. Echocardiogram with ejection fraction 15% grade 1 diastolic dysfunction. Patient is on a regular consistency diet physical and occupational therapy evaluations completed and ongoing with recommendations of physical medicine rehabilitation consult. Patient was admitted for comprehensive rehabilitation program 02/14/2015 and was progressing  nicely requiring moderate assist for transfers minimal assist for hygiene. 02/20/2015 with variations in heart rate and follow-up with rapid response. Heart rate showing to be 20s to 200 on Dinamap however on telemetry 60-80. Due to persistent atrial fibrillation RVR was discharged to acute care services for cardiology follow-up. Patient's Cardizem was adjusted to 240 mg daily as well as maintain Lopressor 75 mg twice a day. Cardiac rate control. Bouts of urinary retention with urecholine added 25 mg 3 times a day on 02/20/2015. Patient cleared by cardiology services and was readmitted for a comprehensive rehabilitation program  ROS ROS Review of Systems  Constitutional: Negative for fever and chills.  Eyes: Positive for blurred vision.  Respiratory: Negative for cough.   Dyspnea on exertion  Cardiovascular: Positive for palpitations. No chest pain Gastrointestinal: Positive for nausea, vomiting and constipation.   GERD  Genitourinary: Negative for dysuria and frequency.  Musculoskeletal: Positive for myalgias and joint pain.  Neurological: Positive for dizziness. Negative for headaches Skin. No rashes  Past Medical History  Diagnosis Date  . History of CHF (congestive heart failure)     POST SURG 2010  . COPD (chronic obstructive pulmonary disease)   . 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 Glaucoma of both eyes   . History of atrial fibrillation without current medication 2010    POST SURG 2010-- PER PT NO ISSUES SINCE  . Arthritis   . Dyspnea on exertion   . GERD (gastroesophageal reflux disease)   . Nephrolithiasis   . History of shingles     02/ 2015-- back of neck and left flank-- no residual pain  . History of hypertension   . Recurrent bladder papillary carcinoma first dx 06/ 2014    s/p turbt's  and instillation mitomycin c  (chemo)   Past Surgical History  Procedure Laterality Date  . Total knee arthroplasty Right 03-22-2009  . Partial mastectomy with needle localization Right 01-14-2003    DCIS  . Total mastectomy Right 02-03-2003    W/ SLN BX AND POST 03-01-2003 EVACUATION HEMATOMA  . Removal cyst left hand  2013  . Transthoracic echocardiogram  03-25-2009    MILD LVF/ EF 70-80%/ MILD INCREASE SYSTOLIC PULMONARY PRESSURE  . Transurethral resection of bladder tumor with gyrus (turbt-gyrus) N/A 03/26/2013    Procedure: TRANSURETHRAL RESECTION OF BLADDER TUMOR WITH GYRUS (TURBT-GYRUS); Surgeon: Alexis Frock, MD; Location: [facility]; Service: Urology; Laterality: N/A;  . Cystoscopy w/ ureteral stent placement Bilateral 03/26/2013    Procedure: CYSTOSCOPY WITH BILATERAL RETROGRADE PYELOGRAM/URETERAL STENT PLACEMENT; Surgeon: Alexis Frock, MD; Location: [facility]; Service: Urology; Laterality: Bilateral;  . Transurethral resection of bladder tumor with gyrus (turbt-gyrus) N/A 05/13/2013    Procedure: TRANSURETHRAL RESECTION OF BLADDER TUMOR WITH GYRUS (TURBT-GYRUS) RE-STAGING TRANSURETHRAL RESECTION OF BLADDER TUMOR, LEFT RETROGRADE PYELOGRAM AND STENT EXCHANGE; Surgeon: Alexis Frock, MD; Location: [facility]; Service: Urology; Laterality: N/A;  . Cystoscopy w/ ureteral stent placement Left 05/13/2013    Procedure: CYSTOSCOPY WITH RETROGRADE PYELOGRAM/URETERAL STENT PLACEMENT STENT EXCHANGE; Surgeon: Alexis Frock, MD; Location: [facility]; Service: Urology; Laterality: Left;  . Tubal ligation    . Cataract extraction w/ intraocular lens implant, bilateral    . Transurethral resection of bladder tumor with gyrus (turbt-gyrus) N/A 08/11/2014    Procedure: TRANSURETHRAL RESECTION OF BLADDER TUMOR WITH GYRUS (TURBT-GYRUS); Surgeon: Alexis Frock, MD;  Location: [facility]; Service: Urology; Laterality: N/A;  . Cystoscopy w/ retrogrades Bilateral 08/11/2014    Procedure: CYSTOSCOPY WITH BILATERAL RETROGRADE PYELOGRAM AND MITOMYCIN INSTILLATION; Surgeon: Alexis Frock, MD; Location: [facility]; Service: Urology; Laterality: Bilateral;  . Craniectomy N/A 02/05/2015    Procedure: SUBOCCIPITAL CRANIECTOMY EVACUATION OF HEMATOMA; Surgeon: Consuella Lose, MD; Location: [facility]; Service: Neurosurgery; Laterality: N/A;   Family History  Problem Relation Age of Onset  . Colon cancer Mother 42  . Colon cancer Maternal Grandmother   . Breast cancer Maternal Aunt 55  . Cancer Maternal Aunt     ? ovarian  . Breast cancer Other 60    breast cancer, ovarian cancer  . Breast cancer Daughter 80   Social History:  reports that she quit smoking about 11 years ago. Her smoking use included Cigarettes. She has a 40 pack-year smoking history. She has never used smokeless tobacco. She reports that she does not drink alcohol or use illicit drugs. Allergies:  Allergies  Allergen Reactions  . Morphine And Related Nausea And Vomiting   Medications Prior to Admission  Medication Sig Dispense Refill  . amoxicillin (AMOXIL) 500 MG capsule Prior to dentist appointment    . atorvastatin (LIPITOR) 10 MG tablet Take 1 tablet (10 mg total) by mouth daily at 6 PM. (Patient not taking: Reported on 02/15/2015) 30 tablet 0  . bethanechol (URECHOLINE) 25 MG tablet Take 1 tablet (25 mg total) by mouth 3 (three) times daily. (Patient not taking: Reported on 02/15/2015) 90 tablet 0  . diltiazem (CARDIZEM) 60 MG tablet Take 1 tablet (60 mg total) by mouth 4 (four) times daily. (Patient not taking: Reported on 02/15/2015) 120 tablet 0  . latanoprost (XALATAN) 0.005 % ophthalmic solution Place 1 drop into both eyes at bedtime.    . levalbuterol  (XOPENEX HFA) 45 MCG/ACT inhaler Inhale 1-2 puffs  into the lungs every 4 (four) hours as needed for wheezing.    . metoprolol (LOPRESSOR) 50 MG tablet Take 1 tablet (50 mg total) by mouth 3 (three) times daily. (Patient not taking: Reported on 02/15/2015) 90 tablet 0  . pantoprazole (PROTONIX) 40 MG tablet Take 40 mg by mouth daily.  3  . SIMBRINZA 1-0.2 % SUSP Place 1 drop into both eyes 2 (two) times daily.   99    Home:    Functional History:    Functional Status:  Mobility:    mod assist for basic mobility and transfers      ADL:    Cognition: Cognition Orientation Level: Oriented X4    Physical Exam: Blood pressure 126/68, pulse 96, temperature 98 F (36.7 C), temperature source Oral, resp. rate 18, weight 72.712 kg (160 lb 4.8 oz), last menstrual period 12/03/2002, SpO2 100 %. Physical Exam Constitutional: She appears well-developed.  HENT:  Craniotomy site clean and dry  Eyes:  Pupils ERRL.  Neck: Normal range of motion. Neck supple. No thyromegaly present.  Cardiovascular: Cardiac rate controlled. No murmur or rubs,  Respiratory: Effort normal and breath sounds normal. No respiratory distress. No wheezes, rales, rubs GI: Soft. Bowel sounds are normal. She exhibits no distension.  Neurological: She is alert.  Mood is flat but appropriate. She is able to provide her name, age date of birth in place. Fair awareness of deficits. Follows simple commands Nystagmus with gaze: R>U>Lower>Left.  Ongoing dysmetria right finger-nose-finger, and dysmetria right heel to shin, minimal dysmetria on left finger-nose-finger, and no evidence of dysmetria left heel-to-shin Motor strength is 4/5 bilateral deltoid, biceps, triceps, grip, hip flexor, knee extensor, ankle dorsal flexor plantar flexor. No sensory deficits.  Psych: pt is pleasant and appropriate    Lab Results Last 48 Hours    Results for orders placed or performed during the hospital  encounter of 02/20/15 (from the past 48 hour(s))  Basic metabolic panel Status: Abnormal   Collection Time: 02/21/15 4:29 AM  Result Value Ref Range   Sodium 139 135 - 145 mmol/L   Potassium 3.8 3.5 - 5.1 mmol/L   Chloride 100 (L) 101 - 111 mmol/L   CO2 32 22 - 32 mmol/L   Glucose, Bld 91 65 - 99 mg/dL   BUN 13 6 - 20 mg/dL   Creatinine, Ser 0.54 0.44 - 1.00 mg/dL   Calcium 8.5 (L) 8.9 - 10.3 mg/dL   GFR calc non Af Amer >60 >60 mL/min   GFR calc Af Amer >60 >60 mL/min    Comment: (NOTE) The eGFR has been calculated using the CKD EPI equation. This calculation has not been validated in all clinical situations. eGFR's persistently <60 mL/min signify possible Chronic Kidney Disease.    Anion gap 7 5 - 15  Magnesium Status: None   Collection Time: 02/21/15 4:29 AM  Result Value Ref Range   Magnesium 2.1 1.7 - 2.4 mg/dL      Imaging Results (Last 48 hours)    No results found.       Medical Problem List and Plan: 1. Functional deficits secondary to right cerebellar hemorrhage status post craniotomy evacuation of hematoma 02/05/2015 2. DVT Prophylaxis/Anticoagulation: SCDs. Monitor for any signs of DVT 3. Pain Management: Tylenol as needed 4. Persistent atrial fibrillation with RVR. Cardizem 240 mg daily, Lopressor 75 mg twice a day. Cardiac rate controlled. Follow-up cardiology services. Not a candidate for anticoagulation due to hemorrhage 5. Neuropsych: This patient is capable of making decisions on  her own behalf. 6. Skin/Wound Care: Routine skin checks 7. Fluids/Electrolytes/Nutrition: Routine I&O with follow-up chemistries 8. Hyperlipidemia. Lipitor 9. Urinary retention. Urecholine 25 mg 3 times a day.Urine study unremarkable. Check PVR 3    Post Admission Physician Evaluation: 1. Functional deficits secondary to right cerebellar hemorrhage. 2. Patient is admitted to receive collaborative,  interdisciplinary care between the physiatrist, rehab nursing staff, and therapy team. 3. Patient's level of medical complexity and substantial therapy needs in context of that medical necessity cannot be provided at a lesser intensity of care such as a SNF. 4. Patient has experienced substantial functional loss from his/her baseline which was documented above under the "Functional History" and "Functional Status" headings. Judging by the patient's diagnosis, physical exam, and functional history, the patient has potential for functional progress which will result in measurable gains while on inpatient rehab. These gains will be of substantial and practical use upon discharge in facilitating mobility and self-care at the household level. 5. Physiatrist will provide 24 hour management of medical needs as well as oversight of the therapy plan/treatment and provide guidance as appropriate regarding the interaction of the two. 6. 24 hour rehab nursing will assist with bladder management, bowel management, safety, skin/wound care, disease management, medication administration, pain management and patient education and help integrate therapy concepts, techniques,education, etc. 7. PT will assess and treat for/with: Lower extremity strength, range of motion, stamina, balance, functional mobility, safety, adaptive techniques and equipment, NMR, vestibular assessment, pain control, family education, ego support. Goals are: supervision. 8. OT will assess and treat for/with: ADL's, functional mobility, safety, upper extremity strength, adaptive techniques and equipment, NMR, vestibular sx mgt, family education, community reintegration. Goals are: supervision. Therapy may proceed with showering this patient. 9. SLP will assess and treat for/with: cognition, speech, communication. Goals are: supervision to mod I. 10. Case Management and Social Worker will assess and treat for psychological issues and discharge  planning. 11. Team conference will be held weekly to assess progress toward goals and to determine barriers to discharge. 12. Patient will receive at least 3 hours of therapy per day at least 5 days per week. 13. ELOS: 11-14 days  14. Prognosis: excellent     Meredith Staggers, MD, [place] [facility] 02/22/2015

## 2015-02-22 NOTE — Progress Notes (Signed)
Inpatient Rehabilitation   I spoke with Ignacia Bayley, NP this am regarding pt's possible readiness for CIR.  Dr. Radford Pax has increased pt's dose of cardizem and wants to observe for effectiveness.  I will communicate later today with Gerald Stabs to see if cardiology feels pt. Is ready for transition back to rehab.  Jacqlyn Krauss, CM and RN Josh aware of status.  Will follow up early afternoon.    [place] Admissions Coordinator Cell [phone] Office [phone]

## 2015-02-22 NOTE — Progress Notes (Signed)
Inpatient Rehabilitation  I have confirmed with Ignacia Bayley that pt. Is medically stable and ready for return to IP rehab today.  I have updated Josh, RN, Jacqlyn Krauss, CM, Poonum Ambelel, SW and with pt. And family.  I will make arrangements for her readmission for later today.  Please call if questions.  [facility] Admissions Coordinator Cell [phone] Office [phone]

## 2015-02-23 ENCOUNTER — Telehealth: Payer: Self-pay

## 2015-02-23 ENCOUNTER — Inpatient Hospital Stay (HOSPITAL_COMMUNITY): Payer: Medicare Other | Admitting: Speech Pathology

## 2015-02-23 ENCOUNTER — Inpatient Hospital Stay (HOSPITAL_COMMUNITY): Payer: Medicare Other | Admitting: Occupational Therapy

## 2015-02-23 ENCOUNTER — Inpatient Hospital Stay (HOSPITAL_COMMUNITY): Payer: Medicare Other | Admitting: Physical Therapy

## 2015-02-23 LAB — COMPREHENSIVE METABOLIC PANEL
ALT: 29 U/L (ref 14–54)
AST: 26 U/L (ref 15–41)
Albumin: 3.1 g/dL — ABNORMAL LOW (ref 3.5–5.0)
Alkaline Phosphatase: 148 U/L — ABNORMAL HIGH (ref 38–126)
Anion gap: 10 (ref 5–15)
BUN: 10 mg/dL (ref 6–20)
CO2: 29 mmol/L (ref 22–32)
CREATININE: 0.57 mg/dL (ref 0.44–1.00)
Calcium: 9.3 mg/dL (ref 8.9–10.3)
Chloride: 103 mmol/L (ref 101–111)
Glucose, Bld: 100 mg/dL — ABNORMAL HIGH (ref 65–99)
Potassium: 4.2 mmol/L (ref 3.5–5.1)
SODIUM: 142 mmol/L (ref 135–145)
TOTAL PROTEIN: 6.2 g/dL — AB (ref 6.5–8.1)
Total Bilirubin: 0.4 mg/dL (ref 0.3–1.2)

## 2015-02-23 LAB — CBC WITH DIFFERENTIAL/PLATELET
Basophils Absolute: 0.1 10*3/uL (ref 0.0–0.1)
Basophils Relative: 1 % (ref 0–1)
EOS PCT: 4 % (ref 0–5)
Eosinophils Absolute: 0.3 10*3/uL (ref 0.0–0.7)
HCT: 43.1 % (ref 36.0–46.0)
HEMOGLOBIN: 13.8 g/dL (ref 12.0–15.0)
Lymphocytes Relative: 35 % (ref 12–46)
Lymphs Abs: 2.4 10*3/uL (ref 0.7–4.0)
MCH: 30.5 pg (ref 26.0–34.0)
MCHC: 32 g/dL (ref 30.0–36.0)
MCV: 95.1 fL (ref 78.0–100.0)
MONO ABS: 0.5 10*3/uL (ref 0.1–1.0)
Monocytes Relative: 7 % (ref 3–12)
Neutro Abs: 3.5 10*3/uL (ref 1.7–7.7)
Neutrophils Relative %: 53 % (ref 43–77)
PLATELETS: 283 10*3/uL (ref 150–400)
RBC: 4.53 MIL/uL (ref 3.87–5.11)
RDW: 14.4 % (ref 11.5–15.5)
WBC: 6.8 10*3/uL (ref 4.0–10.5)

## 2015-02-23 NOTE — Care Management Note (Signed)
Inpatient [facility] Individual Statement of Services  Patient Name:  [name]  Date:  02/23/2015  Welcome to the [facility].  Our goal is to provide you with an individualized program based on your diagnosis and situation, designed to meet your specific needs.  With this comprehensive rehabilitation program, you will be expected to participate in at least 3 hours of rehabilitation therapies Monday-Friday, with modified therapy programming on the weekends.  Your rehabilitation program will include the following services:  Physical Therapy (PT), Occupational Therapy (OT), Speech Therapy (ST), 24 hour per day rehabilitation nursing, Therapeutic Recreaction (TR), Case Management (Social Worker), Rehabilitation Medicine, Nutrition Services and Pharmacy Services  Weekly team conferences will be held on Wednesday to discuss your progress.  Your Social Worker will talk with you frequently to get your input and to update you on team discussions.  Team conferences with you and your family in attendance may also be held.  Expected length of stay: 14-16 days Overall anticipated outcome: supervision-set up/some min  Depending on your progress and recovery, your program may change. Your Social Worker will coordinate services and will keep you informed of any changes. Your Social Worker's name and contact numbers are listed  below.  The following services may also be recommended but are not provided by the [facility] will be made to provide these services after discharge if needed.  Arrangements include referral to agencies that provide these services.  Your insurance has been verified to be:  Medicare & [place] Your primary doctor is:  [name]  Pertinent information will be shared with your doctor and your insurance  company.  Social Worker:  Ovidio Kin, [place] or [phone]  Information discussed with and copy given to patient by: Elease Hashimoto, 02/23/2015, 9:13 AM

## 2015-02-23 NOTE — Telephone Encounter (Signed)
TCM List:   Pt was dc'ed back to [facility] facility

## 2015-02-23 NOTE — Progress Notes (Signed)
Speech Language Pathology Progress Update and Session Note  Patient Details  Name: [name] MRN: [id number] Date of Birth: Mar 07, 1935  Today's Date: 02/23/2015 SLP Individual Time: 0932-1000 SLP Individual Time Calculation (min): 28 min  HPI: [name] is a 80 y.o. right handed female with history of hypertension, transient atrial fibrillation maintained on aspirin, diastolic congestive heart failure, COPD, recurrent bladder papillary carcinoma as well as breast cancer with mastectomy. Patient lives with husband and independent prior to admission. Presented 02/05/2015 with dizziness and nausea/vomiting as well as bouts of blurred vision. Denies any recent fall or head trauma. CT of the head showed a 4 cm acute hemorrhage within the right cerebellar hemisphere with surrounding edema and mass effect on the fourth ventricle as well as lacunar infarct of the right caudate head appeared to be remote. Underwent right retrosigmoid suboccipital craniotomy, evacuation of hematoma 02/05/2015 per Dr. Kathyrn Sheriff. Hospital course blood pressure management. Follow-up cranial CT scans shows that right cerebellar hematoma remains spanning over a 4.1 x 3.5 cm versus 4.4 x 3.5 cm as well as surrounding vasogenic edema mildly progressed. A follow-up cranial CT scan for 02/08/2015 with a small amount of mild hydrocephalus advised monitoring and unchanged with repeat scan 02/13/2015. Placed on 3% saline to decrease any cerebral edema and later discontinued.. Cardiology consulted 02/08/2015 for a noted history of transient atrial fibrillation had been on aspirin prior to admission patient with bouts of A. fib RVR and was placed on Cardizem with rate controlled. She was not a candidate for systemic anticoagulation given her hemorrhage. Echocardiogram with ejection fraction 66% grade 1 diastolic dysfunction. Patient is on a regular consistency diet physical and occupational therapy evaluations completed and  ongoing with recommendations of physical medicine rehabilitation consult. Patient was admitted for comprehensive rehabilitation program 02/14/2015 and was progressing nicely requiring moderate assist for transfers minimal assist for hygiene. 02/20/2015 with variations in heart rate and follow-up with rapid response. Heart rate showing to be 20s to 200 on Dinamap however on telemetry 60-80. Due to persistent atrial fibrillation RVR was discharged to acute care services for cardiology follow-up. Patient's Cardizem was adjusted to 240 mg daily as well as maintain Lopressor 75 mg twice a day. Cardiac rate control. Bouts of urinary retention with urecholine added 25 mg 3 times a day on 02/20/2015. Patient cleared by cardiology services and was readmitted for a comprehensive rehabilitation program 02/22/15.   Since pt had a interrupted stay pt was seen for a brief re-evaluation and re-assessment of goals. Pt is still on target for long term goals set at supervision for complex problem solving. Pt continues to have a very involved family who can be supportive at d/c. Pt participated in semi-complex problem solving task with Min questioning cues to request help as needed and then Min verbal cues to identify appropriate compensatory strategies. Pt able to read and comprehend basic large font text with increased time.     Patient continues to demonstrate the mild cognitive impairments that are compounded by visual deficits and motor ataxia in her right upper extremity.  These deficits are impacting her overall ability to complete basic self-care tasks as well as complex home management tasks and therefore will continue to benefit from skilled SLP intervention to enhance overall safety and independence as well as to reduce care partner burden. Anticipate that pt will need 24/7 supervision at discharge and assistance for medication and financial management.   Patient progressing toward long term goals. Continue plan of  care.  Short  Term Goals: Week 1: SLP Short Term Goal 1 (Week 1): Pt will recognize and correct errors with Supervision question cues during semi-complex self care and/or home management tasks.  SLP Short Term Goal 2 (Week 1): Pt will anticipate at least 2 ways in which her current deficits will impact her ability to complete self care/home management tasks upon discharge and will generate appropriate solutions to problems with supervision level quesion cues.   Daily Session  Skilled Therapeutic Interventions:  Skilled treatment session focused on addressing basic cognition goals with Min assist question cues to identify deficits, request help as needed and Min verbal cues to identify appropriate compensatory strategies.  A basic paragraph level reading and comprehension task was completed with increased time once effective strategies were identified.  Daughter's present to observe session.  Continue with current plan of care.     FIM:  Comprehension Comprehension Mode: Auditory Comprehension: 6-Follows complex conversation/direction: With extra time/assistive device Expression Expression Mode: Verbal Expression: 5-Expresses complex 90% of the time/cues < 10% of the time Social Interaction Social Interaction: 4-Interacts appropriately 75 - 89% of the time - Needs redirection for appropriate language or to initiate interaction. General    Pain  none  Therapy/Group: Individual Therapy  Carmelia Roller., CCC-SLP [phone]  [place] 02/23/2015, 1:08 PM

## 2015-02-23 NOTE — Progress Notes (Signed)
Occupational Therapy Session Note  Patient Details  Name: [name] MRN: [id number] Date of Birth: [birth year]  Today's Date: 02/23/2015 OT Individual Time: 0272-5366 OT Individual Time Calculation (min): 54 min    Short Term Goals: Week 1:  OT Short Term Goal 1 (Week 1): Pt will perform 3/3 toileting tasks with steady A  OT Short Term Goal 2 (Week 1): Pt will complete LB dressing with min assist at sit > stand level OT Short Term Goal 3 (Week 1): Pt will complete toilet transfers with supervision assist with grab bars OT Short Term Goal 4 (Week 1): Pt will complete walk-in shower transfers with min assist OT Short Term Goal 5 (Week 1): Pt will complete 2 grooming tasks in standing with steady assist for standing balance  Skilled Therapeutic Interventions/Progress Updates:  Upon entering the room, pt supine in bed with 2 daughters and husband present in room. Pt with reports of R rib soreness. RN aware. OT session with focus on pt/family education, functional transfer training, and toileting. Pt's daughters, [name] and [name], actively participate in transfer training this session. OT demonstrated bed <> wheelchair transfer with recommended squat pivot transfer. Caregivers each returning demonstration with min verbal cues for proper technique. They both demonstrated good body mechanics during transfer. Therapist transferred each of the family members as well in order for them to feel what therapist is asking them to do and the level of assistance needed. [name] then generalizing education for set up of toilet transfer onto elevated toilet seat. Stand pivot transfer with min A to toilet. Caregiver providing assistance for clothing management while pt demonstrates hygiene.Caregivers limiting patient participation by providing too much assistance. OT discussed concept of allowing pt to do as much for herself as possible in order to improve in which they verbalized understanding. Pt transferred  back to bed with squat pivot and supervision for sit >supine secondary to fatigue. OT educated and provided handouts regarding energy conservation. Daughters checked off for transfer training but education to continue as they are providing more assistance than needed.    Therapy Documentation Precautions:  Restrictions Weight Bearing Restrictions: No Vital Signs: Therapy Vitals Temp: 97.7 F (36.5 C) Temp Source: Oral Pulse Rate: 77 Resp: 16 BP: (!) 118/55 mmHg Patient Position (if appropriate): Lying Oxygen Therapy SpO2: 94 %  See FIM for current functional status  Therapy/Group: Individual Therapy  Phineas Semen 02/23/2015, 4:21 PM

## 2015-02-23 NOTE — Progress Notes (Signed)
80 y.o. right handed female with history of hypertension, transient atrial fibrillation maintained on aspirin, diastolic congestive heart failure, COPD, recurrent bladder papillary carcinoma as well as breast cancer with mastectomy. Patient lives with husband and independent prior to admission. Presented 02/05/2015 with dizziness and nausea/vomiting as well as bouts of blurred vision. Denies any recent fall or head trauma. CT of the head showed a 4 cm acute hemorrhage within the right cerebellar hemisphere with surrounding edema and mass effect on the fourth ventricle as well as lacunar infarct of the right caudate head appeared to be remote. Underwent right retrosigmoid suboccipital craniotomy, evacuation of hematoma 02/05/2015 per Dr. Kathyrn Sheriff. Hospital course blood pressure management. Follow-up cranial CT scans shows that right cerebellar hematoma remains spanning over a 4.1 x 3.5 cm versus 4.4 x 3.5 cm as well as surrounding vasogenic edema mildly progressed Discharge to acute from CIR for Afib RVR, now rate is controlled and pt readmeitted Subjective/Complaints: Feels ok, no nausea or vomiting, no diarrhea, discussed meds with daughter ROS- denies bladder or bowel issues   Objective: Vital Signs: Blood pressure 133/80, pulse 97, temperature 97.7 F (36.5 C), temperature source Oral, resp. rate 16, height _0  (1.676 m), weight 78.155 kg (172 lb 4.8 oz), last menstrual period 12/03/2002, SpO2 95 %. No results found. Results for orders placed or performed during the hospital encounter of 02/20/15 (from the past 72 hour(s))  Basic metabolic panel     Status: Abnormal   Collection Time: 02/21/15  4:29 AM  Result Value Ref Range   Sodium 139 135 - 145 mmol/L   Potassium 3.8 3.5 - 5.1 mmol/L   Chloride 100 (L) 101 - 111 mmol/L   CO2 32 22 - 32 mmol/L   Glucose, Bld 91 65 - 99 mg/dL   BUN 13 6 - 20 mg/dL   Creatinine, Ser 0.54 0.44 - 1.00 mg/dL   Calcium 8.5 (L) 8.9 - 10.3 mg/dL   GFR calc  non Af Amer >60 >60 mL/min   GFR calc Af Amer >60 >60 mL/min    Comment: (NOTE) The eGFR has been calculated using the CKD EPI equation. This calculation has not been validated in all clinical situations. eGFR's persistently <60 mL/min signify possible Chronic Kidney Disease.    Anion gap 7 5 - 15  Magnesium     Status: None   Collection Time: 02/21/15  4:29 AM  Result Value Ref Range   Magnesium 2.1 1.7 - 2.4 mg/dL     HEENT: nystagmus Cardio: irregular and tachy Resp: CTA B/L and unlabored GI: BS positive and non tender, non distended Extremity:  Pulses positive and No Edema Skin:   Intact Neuro: Confused and Abnormal FMC Ataxic/ dec FMC Musc/Skel:  Normal Gen NAD   Assessment/Plan: 1. Functional deficits secondary to Right cerebellar ICH which require 3+ hours per day of interdisciplinary therapy in a comprehensive inpatient rehab setting. Physiatrist is providing close team supervision and 24 hour management of active medical problems listed below. Physiatrist and rehab team continue to assess barriers to discharge/monitor patient progress toward functional and medical goals. FIM:                   Comprehension Comprehension Mode: Auditory Comprehension: 6-Follows complex conversation/direction: With extra time/assistive device  Expression Expression Mode: Verbal Expression: 5-Expresses complex 90% of the time/cues < 10% of the time  Social Interaction Social Interaction: 4-Interacts appropriately 75 - 89% of the time - Needs redirection for appropriate language or to initiate  interaction.  Problem Solving Problem Solving: 4-Solves basic 75 - 89% of the time/requires cueing 10 - 24% of the time  Memory Memory: 5-Recognizes or recalls 90% of the time/requires cueing < 10% of the time  Medical Problem List and Plan: 1. Functional deficits secondary to right cerebellar hemorrhage status post craniotomy evacuation of hematoma 02/05/2015 2. DVT  Prophylaxis/Anticoagulation: SCDs. Monitor for any signs of DVT 3. Pain Management: Tylenol as needed 4. Persistent atrial fibrillation with RVR. Cardizem 240 mg daily, Lopressor 75 mg twice a day. Cardiac rate controlled. Follow-up cardiology services. Not a candidate for anticoagulation due to hemorrhage 5. Neuropsych: This patient is capable of making decisions on her own behalf. 6. Skin/Wound Care: Routine skin checks 7. Fluids/Electrolytes/Nutrition: Routine I&O with follow-up chemistries 8. Hyperlipidemia. Lipitor 9. Urinary retention. Urecholine 25 mg 3 times a day.Urine study unremarkable.family requests stopping this  Check PVR 3 LOS (Days) 1 A FACE TO FACE EVALUATION WAS PERFORMED  [name] E 02/23/2015, 7:45 AM

## 2015-02-23 NOTE — Progress Notes (Signed)
Social Work Assessment and Plan Social Work Assessment and Plan  Patient Details  Name: [name] MRN: [id number] Date of Birth: Jul 05, 1935  Today's Date: 02/23/2015  Problem List:  Patient Active Problem List   Diagnosis Date Noted  . Possible Bradycardia 02/22/2015  . Cerebellar hemorrhage, nontraumatic 02/22/2015  . Benign essential HTN 02/21/2015  . Atrial fibrillation 02/20/2015  . 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 Dizziness   . Cytotoxic cerebral edema   . Acute cerebellar hemorrhage   . Hypokalemia   . Chronic combined systolic and diastolic congestive heart failure   . Atrial fibrillation with RVR   . HLD (hyperlipidemia)   . Obstructive hydrocephalus   . Cerebellar hemorrhage 02/05/2015  . ICH (intracerebral hemorrhage) 02/05/2015  . COPD bronchitis 12/30/2014  . GERD (gastroesophageal reflux disease) 12/30/2014  . Bladder cancer 01/01/2014  . CHF (congestive heart failure) 01/01/2014  . Glaucoma 01/01/2014  . DCIS (ductal carcinoma in situ) of breast 01/01/2014   Past Medical History:  Past Medical History  Diagnosis Date  . History of CHF (congestive heart failure)     POST SURG 2010  . COPD (chronic obstructive pulmonary disease)   . History of breast cancer     DX DCIS IN 2004--  S/P RIGHT MASTECTOMY , NO CHEMORADIATION---  NO RECURRENCE  . Glaucoma of both eyes   . History of atrial fibrillation without current medication 2010    POST SURG 2010--  PER PT NO ISSUES SINCE  . Arthritis   . Dyspnea on exertion   . GERD (gastroesophageal reflux disease)   . Nephrolithiasis   . History of shingles     02/ 2015--  back of neck and left flank--  no residual pain  . History of hypertension   . Recurrent bladder papillary carcinoma first dx 06/ 2014    s/p  turbt's  and instillation mitomycin c (chemo)  . ICH (intracerebral hemorrhage) 02/05/2015   Past Surgical History:  Past Surgical History  Procedure Laterality Date  . Total knee  arthroplasty Right 03-22-2009  . Partial mastectomy with needle localization Right 01-14-2003    DCIS  . Total mastectomy Right 02-03-2003    W/ SLN BX  AND  POST 03-01-2003 EVACUATION HEMATOMA  . Removal cyst left hand  2013  . Transthoracic echocardiogram  03-25-2009    MILD LVF/ EF 70-80%/ MILD INCREASE SYSTOLIC PULMONARY  PRESSURE  . Transurethral resection of bladder tumor with gyrus (turbt-gyrus) N/A 03/26/2013    Procedure: TRANSURETHRAL RESECTION OF BLADDER TUMOR WITH GYRUS (TURBT-GYRUS);  Surgeon: Alexis Frock, MD;  Location: [facility];  Service: Urology;  Laterality: N/A;  . Cystoscopy w/ ureteral stent placement Bilateral 03/26/2013    Procedure: CYSTOSCOPY WITH BILATERAL RETROGRADE PYELOGRAM/URETERAL STENT PLACEMENT;  Surgeon: Alexis Frock, MD;  Location: [facility];  Service: Urology;  Laterality: Bilateral;  . Transurethral resection of bladder tumor with gyrus (turbt-gyrus) N/A 05/13/2013    Procedure: TRANSURETHRAL RESECTION OF BLADDER TUMOR WITH GYRUS (TURBT-GYRUS)  RE-STAGING TRANSURETHRAL RESECTION OF BLADDER TUMOR, LEFT RETROGRADE PYELOGRAM AND STENT EXCHANGE;  Surgeon: Alexis Frock, MD;  [facility];  Service: Urology;  Laterality: N/A;  . Cystoscopy w/ ureteral stent placement Left 05/13/2013    Procedure: CYSTOSCOPY WITH RETROGRADE PYELOGRAM/URETERAL STENT PLACEMENT STENT EXCHANGE;  Surgeon: Alexis Frock, MD;  Location: [facility];  Service: Urology;  Laterality: Left;  . Tubal ligation    . Cataract extraction w/ intraocular lens  implant, bilateral    .  Transurethral resection of bladder tumor with gyrus (turbt-gyrus) N/A 08/11/2014    Procedure: TRANSURETHRAL RESECTION OF BLADDER TUMOR WITH GYRUS (TURBT-GYRUS);  Surgeon: Alexis Frock, MD;  Location: [facility];  Service: Urology;  Laterality: N/A;  . Cystoscopy w/ retrogrades Bilateral 08/11/2014    Procedure: CYSTOSCOPY WITH  BILATERAL RETROGRADE PYELOGRAM AND MITOMYCIN INSTILLATION;  Surgeon: Alexis Frock, MD;  Location: [facility];  Service: Urology;  Laterality: Bilateral;  . Craniectomy N/A 02/05/2015    Procedure: SUBOCCIPITAL CRANIECTOMY EVACUATION OF HEMATOMA;  Surgeon: Consuella Lose, MD;  Location: [facility];  Service: Neurosurgery;  Laterality: N/A;   Social History:  reports that she quit smoking about 11 years ago. Her smoking use included Cigarettes. She has a 40 pack-year smoking history. She has never used smokeless tobacco. She reports that she does not drink alcohol or use illicit drugs.  Family / Support Systems Marital Status: Married Patient Roles: Parent, Spouse Spouse/Significant Other: [name] [phone]  [phone] Children: [name]-daughter [phone] lives close to parents Other Supports: Another Chiropodist in [place] for the summer and son whom lives with his parents, along with another daughter in [place] Anticipated Caregiver: Son, husband and daughter's Ability/Limitations of Caregiver: Husband is a Health visitor, son is managing the rentals they have and daughter's will adjust their schedules if needed Caregiver Availability: 24/7 Family Dynamics: Very close knit family-daughter's are very involved and here daily to provide support to Mom. Three daughter's are planning to provide care.  Couple has very good social supports and church supports.  Social History Preferred language: English Religion: Methodist Cultural Background: No issues Education: [employer] Educated Read: Yes Write: Yes Employment Status: Retired Freight forwarder Issues: No issues Guardian/Conservator: None-according to MD pt is capable of mkaing her own decisions while here. There is always another fmaily member here with her also.   Abuse/Neglect Physical Abuse: Denies Verbal Abuse: Denies Sexual Abuse: Denies Exploitation of patient/patient's resources:  Denies Self-Neglect: Denies  Emotional Status Pt's affect, behavior adn adjustment status: Pt is motivated and feels much better now her medications have been adjusted for her atrial fib.  She wants to regain her independence once again and get back to her active life. Her daughter's vouch for her independence and get up and go. Recent Psychosocial Issues: Has other health issues but were managed Pyschiatric History: No history has always been able to manage and uses her family support and faith to get her through.  She takes each day at a time and seems to be doing well and coping appropriately at this time. Will monitor while here and have neuro-psych intervene if necessary. Substance Abuse History: No issues  Patient / Family Perceptions, Expectations & Goals Pt/Family understanding of illness & functional limitations: Pt and daughter's have a very clear understanding of her condition and stay up on her medicines and when they need to be given.  They speak to the MD's daily and feel they have a good understanding of her treatment plan while here. All are strong advocates for their mother. Premorbid pt/family roles/activities: Wife, MOther, grandmother, retiree, church member, etc Anticipated changes in roles/activities/participation: resume Pt/family expectations/goals: Pt states: " I'm glad to be back and ready to work in rehab."  Daughter states: " We will do whatever is needed for her at discharge. We are hopeful she will do well here."  [employer]: None Premorbid Home Care/DME Agencies: None Transportation available at discharge: Family members  Resource referrals recommended: Support group (specify)  Discharge Planning  Living Arrangements: Spouse/significant other, Children Support Systems: Spouse/significant other, Children, Other relatives, Friends/neighbors, Church/faith community Type of Residence: Private residence Insurance Resources: [employer],  Multimedia programmer (specify) Nurse, mental health) Financial Resources: Fish farm manager, Family Support Financial Screen Referred: No Living Expenses: Lives with family Money Management: Patient, Spouse Does the patient have any problems obtaining your medications?: No Home Management: She was doing the home management prior to admission Patient/Family Preliminary Plans: Return home with husband and son who are there in the evenings, daughter's can arrange caregivers during the day if needed.  Will await team's evaluations to see exactly what goals and recommendations for care at discharge there are. Will work on discharge plans with all. Social Work Anticipated Follow Up Needs: HH/OP, Support Group  Clinical Impression Pleasant patient who is familiar with rehab due to was here a few days ago then transferred to acute for medical issues. Very committed family-daughter's here daily who are involved and will assist with Discharge needs. Will work on discharge plans and provide support to pt while here.  Elease Hashimoto 02/23/2015, 9:46 AM

## 2015-02-23 NOTE — Progress Notes (Signed)
Occupational Therapy Re-evaulation  Progress Note  Patient Details  Name: [name] MRN: [id number] Date of Birth: 19-Apr-1935  Today's Date: 02/23/2015 OT Individual Time: 0800-0900 OT Individual Time Calculation (min): 60 min   HPI: [name] is a 80 y.o. right handed female with history of hypertension, transient atrial fibrillation maintained on aspirin, diastolic congestive heart failure, COPD, recurrent bladder papillary carcinoma as well as breast cancer with mastectomy. Patient lives with husband and independent prior to admission. Presented 02/05/2015 with dizziness and nausea/vomiting as well as bouts of blurred vision. Denies any recent fall or head trauma. CT of the head showed a 4 cm acute hemorrhage within the right cerebellar hemisphere with surrounding edema and mass effect on the fourth ventricle as well as lacunar infarct of the right caudate head appeared to be remote. Underwent right retrosigmoid suboccipital craniotomy, evacuation of hematoma 02/05/2015 per Dr. Kathyrn Sheriff. Hospital course blood pressure management. Follow-up cranial CT scans shows that right cerebellar hematoma remains spanning over a 4.1 x 3.5 cm versus 4.4 x 3.5 cm as well as surrounding vasogenic edema mildly progressed. A follow-up cranial CT scan for 02/08/2015 with a small amount of mild hydrocephalus advised monitoring and unchanged with repeat scan 02/13/2015. Placed on 3% saline to decrease any cerebral edema and later discontinued.. Cardiology consulted 02/08/2015 for a noted history of transient atrial fibrillation had been on aspirin prior to admission patient with bouts of A. fib RVR and was placed on Cardizem with rate controlled. She was not a candidate for systemic anticoagulation given her hemorrhage. Echocardiogram with ejection fraction 56% grade 1 diastolic dysfunction. Patient is on a regular consistency diet physical and occupational therapy evaluations completed and ongoing with  recommendations of physical medicine rehabilitation consult. Patient was admitted for comprehensive rehabilitation program 02/14/2015 and was progressing nicely requiring moderate assist for transfers minimal assist for hygiene. 02/20/2015 with variations in heart rate and follow-up with rapid response. Heart rate showing to be 20s to 200 on Dinamap however on telemetry 60-80. Due to persistent atrial fibrillation RVR was discharged to acute care services for cardiology follow-up. Patient's Cardizem was adjusted to 240 mg daily as well as maintain Lopressor 75 mg twice a day. Cardiac rate control. Bouts of urinary retention with urecholine added 25 mg 3 times a day on 02/20/2015. Patient cleared by cardiology services and was readmitted for a comprehensive rehabilitation program.   Since pt had a interrupted stay pt was seen for re-evaluation and re assessment of goals.  Pt is still on target for long term goals set at supervision sit to stand. Pt continues to have a very involved family who can be supportive at d/c. Pt participated in bathing and dressing at shower level with focus on stand step transfers, sit to stand, standing balance static and dynamic, visual scanning with mod VC, functional problem solving with mod questioning cues etc. Pt able to perform stand pivot transfers with min A with cuing for sequence and tactile cues to facilitate weight shift. Daughter present and very involved in pt's care.   Patient continues to demonstrate the following deficits: abnormal postural control, visual disturbances including blurried and double vision, decr visual tracking, slow pursuits and saccades, difficulty tracking with right eye, right visual inattention to environment,  difficulty keeping eyes open during sessions, decr dynamic and static standing balance, right side hemiparesis (UE Brunnstrom V , decr functional problem solving  and therefore will continue to benefit from skilled OT intervention to  enhance overall performance with BADL,  iADL and Reduce care partner burden.  Patient progressing toward long term goals..  Continue plan of care.  OT Short Term Goals Week 1:  OT Short Term Goal 1 (Week 1): Pt will perform 3/3 toileting tasks with steady A  OT Short Term Goal 2 (Week 1): Pt will complete LB dressing with min assist at sit > stand level OT Short Term Goal 3 (Week 1): Pt will complete toilet transfers with supervision assist with grab bars OT Short Term Goal 4 (Week 1): Pt will complete walk-in shower transfers with min assist OT Short Term Goal 5 (Week 1): Pt will complete 2 grooming tasks in standing with steady assist for standing balance  Skilled Therapeutic Interventions/Progress Updates:  Balance/vestibular training;Discharge planning;Disease mangement/prevention;DME/adaptive equipment instruction;Functional mobility training;Neuromuscular re-education;Pain management;Patient/family education;Psychosocial support;Self Care/advanced ADL retraining;Therapeutic Activities;Therapeutic Exercise;UE/LE Strength taining/ROM;UE/LE Coordination activities;Visual/perceptual remediation/compensation;Cognitive remediation/compensation;Community reintegration;Wheelchair propulsion/positioning   Therapy Documentation Precautions:  Restrictions Weight Bearing Restrictions: No Pain:  no c/o pain  ADL:   see FIM  See FIM for current functional status  Therapy/Group: Individual Therapy  Willeen Cass [facility] 02/23/2015, 2:53 PM

## 2015-02-23 NOTE — Progress Notes (Signed)
Patient was discharged to acute services on 02/20/15 and returned back to inpatient rehabilitation on 02/22/15.  Due to returning within 3 consecutive calendar days this qualifies as an interrupted stay per CMS guidelines.  Records will be merged, one IRF-PAI will be transmitted to CMS for entire stay being 02/14/15 through discharge with billing notified to send one claim for entire stay.  Please note record [phone] and [phone] are one IRF stay when reviewing this record.  New documentation was provided to capture any changes in condition only.  Date of service to begin on 02/14/15.

## 2015-02-23 NOTE — Progress Notes (Signed)
Physical Therapy Re-Evaluation Note due to Interrupted Stay  Patient Details  Name: [name] MRN: [id number] Date of Birth: November 07, 1934  Today's Date: 02/23/2015 PT Individual Time: 1400-1500 PT Individual Time Calculation (min): 67 min   Patient is a 80 y.o. right handed female with history of hypertension, transient atrial fibrillation maintained on aspirin, diastolic congestive heart failure, COPD, recurrent bladder papillary carcinoma as well as breast cancer with mastectomy. Patient lives with husband and independent prior to admission. Presented 02/05/2015 with dizziness and nausea/vomiting as well as bouts of blurred vision. Denies any recent fall or head trauma. CT of the head showed a 4 cm acute hemorrhage within the right cerebellar hemisphere with surrounding edema and mass effect on the fourth ventricle as well as lacunar infarct of the right caudate head appeared to be remote. Underwent right retrosigmoid suboccipital craniotomy, evacuation of hematoma 02/05/2015 per Dr. Kathyrn Sheriff. Hospital course blood pressure management. Follow-up cranial CT scans shows that right cerebellar hematoma remains spanning over a 4.1 x 3.5 cm versus 4.4 x 3.5 cm as well as surrounding vasogenic edema mildly progressed. A follow-up cranial CT scan for 02/08/2015 with a small amount of mild hydrocephalus advised monitoring and unchanged with repeat scan 02/13/2015. Placed on 3% saline to decrease any cerebral edema and later discontinued.. Cardiology consulted 02/08/2015 for a noted history of transient atrial fibrillation had been on aspirin prior to admission patient with bouts of A. fib RVR and was placed on Cardizem with rate controlled. She was not a candidate for systemic anticoagulation given her hemorrhage. Echocardiogram with ejection fraction 67% grade 1 diastolic dysfunction. Patient is on a regular consistency diet physical and occupational therapy evaluations completed and ongoing with  recommendations of physical medicine rehabilitation consult. Patient was admitted for comprehensive rehabilitation program 02/14/2015 and was progressing nicely requiring moderate assist for transfers minimal assist for hygiene. 02/20/2015 with variations in heart rate and follow-up with rapid response. Heart rate showing to be 20s to 200 on Dinamap however on telemetry 60-80. Due to persistent atrial fibrillation RVR was discharged to acute care services for cardiology follow-up. Patient's Cardizem was adjusted to 240 mg daily as well as maintain Lopressor 75 mg twice a day. Cardiac rate control. Bouts of urinary retention with urecholine added 25 mg 3 times a day on 02/20/2015. Patient cleared by cardiology services and was readmitted for a comprehensive rehabilitation program.  Patient transferred to CIR on 02/22/2015   Patient continues to demonstrate the following deficits: impaired activity tolerance and endurance, vertigo of central origin, impaired postural control and balance, impaired motor planning, timing/sequencing, motor control, muscle grading and coordination, impaired gait and therefore will continue to benefit from skilled PT intervention to enhance overall performance with activity tolerance, balance, postural control, ability to compensate for deficits, functional use of  right lower extremity and left lower extremity, attention and coordination.  Patient Goals re-instated after interrupted stay.  Continue plan of care.  PT Short Term Goals Week 1:  PT Short Term Goal 1 (Week 1): Pt to perform bed mobility with supevision PT Short Term Goal 1 - Progress (Week 1): Met PT Short Term Goal 2 (Week 1): Pt will perform bed <> chair transfers with consistent Min A PT Short Term Goal 3 (Week 1): Pt to demonstrate w/c propulsion 100 ft supervision PT Short Term Goal 4 (Week 1): Pt to ambulate 75 ft with LRAD and modA +1 PT Short Term Goal 5 (Week 1): Pt to perform ascent/descent of 6 3-inch  stairs with  modA +1  Skilled Therapeutic Interventions/Progress Updates:  Ambulation/gait training;Balance/vestibular training;DME/adaptive equipment instruction;Discharge planning;Functional mobility training;Therapeutic Activities;Therapeutic Exercise;Wheelchair propulsion/positioning;UE/LE Strength taining/ROM;UE/LE Chief Strategy Officer education;Neuromuscular re-education;Community reintegration;Visual/perceptual remediation/compensation   Pt received in bed with daughters present.  Pt performed transfer supine > sit with supervision.  Seated EOB pt continues to present with impaired ability to maintain postural control and trunk in midline; presents with R posterior lean in sitting but able to maintain balance supervision.  Due to results of motion sensitivity testing last week, attempted to engage pt in nose to knee movement to don and tie shoes.  Daughters adamant about donning and tying patient's shoes for her since, "she won't be wearing these at home."  Educated daughters again on cause of dizziness and purpose of habituation exercises in functional tasks and discussed that the pt may wear these shoes for outpatient therapy and that this movement is useful also for dressing tasks and reaching.  Daughters continued to don shoes.  Pt performed squat pivot transfer bed > w/c mod A with verbal cues for hand placement and sequence.  Performed w/c mobility x 40' with bilat foot propulsion with min A and extra time due to poor coordination.  Transferred rest of way to gym total A.  Performed vitals assessment: 118/80, HR: 84 bpm.  Performed gait with RW x 30' with mod A overall with max verbal and tactile cues to maintain upright posture/gaze, lateral weight shifting and for controlled forward weight shift of COG over BOS.  Pt fatigued quickly.  Engaged pt in [place]; see below for details.  Performed stair negotiation training on 4 steps (3") with 2 rails and mod A overall with  facilitation and verbal cues for lateral weight shifting during step to sequencing to ascend forwards and descend backwards.  Returned to room in w/c and daughter transferred pt back to bed with total A squat pivot.  Educated pt on her body positioning and level of assistance given.  Demonstrated to daughter how to provide support and safety but allow pt to initiate more.  At EOB again engaged pt in modified habituation exercise of leaning down to untie each shoe.  Pt performed sit > supine supervision and requested all 4 rails to prop pillows on.  Pt left with family present and all items within reach.    Therapy Documentation Precautions:  Restrictions Weight Bearing Restrictions: No Vital Signs: Therapy Vitals Temp: 97.7 F (36.5 C) Temp Source: Oral Pulse Rate: 77 Resp: 16 BP: (!) 118/55 mmHg Patient Position (if appropriate): Lying Oxygen Therapy SpO2: 94 % Pain:  No c/o pain Locomotion : Ambulation Ambulation/Gait Assistance: 3: Mod assist Wheelchair Mobility Distance: 40  Other Treatments: Treatments Neuromuscular Facilitation: Right;Left;Lower Extremity;Activity to increase coordination;Activity to increase motor control;Activity to increase timing and sequencing;Activity to increase grading;Activity to increase lateral weight shifting;Activity to increase anterior-posterior weight shifting during gait x 20' with UE support and wall rail on L side as boundary for weight shifting and postural control.  With boundary and verbal cues pt better able to initiate L weight shift during L stance for improved RLE advancement.    See FIM for current functional status  Therapy/Group: Individual Therapy and re-evaluation   Raylene Everts [facility] 02/23/2015, 4:12 PM

## 2015-02-24 ENCOUNTER — Inpatient Hospital Stay (HOSPITAL_COMMUNITY): Payer: Medicare Other | Admitting: Occupational Therapy

## 2015-02-24 ENCOUNTER — Inpatient Hospital Stay (HOSPITAL_COMMUNITY): Payer: Medicare Other | Admitting: Physical Therapy

## 2015-02-24 ENCOUNTER — Inpatient Hospital Stay (HOSPITAL_COMMUNITY): Payer: Medicare Other | Admitting: Speech Pathology

## 2015-02-24 DIAGNOSIS — I480 Paroxysmal atrial fibrillation: Secondary | ICD-10-CM

## 2015-02-24 NOTE — Progress Notes (Signed)
Physical Therapy Session Note  Patient Details  Name: [name] MRN: [id number] Date of Birth: [birth year]  Today's Date: 02/24/2015 PT Individual Time: 1105-1200 PT Individual Time Calculation (min): 55 min   Short Term Goals: Week 1:  PT Short Term Goal 1 (Week 1): Pt to perform bed mobility with supevision PT Short Term Goal 1 - Progress (Week 1): Met PT Short Term Goal 2 (Week 1): Pt will perform bed <> chair transfers with consistent Min A PT Short Term Goal 3 (Week 1): Pt to demonstrate w/c propulsion 100 ft supervision PT Short Term Goal 4 (Week 1): Pt to ambulate 75 ft with LRAD and modA +1 PT Short Term Goal 5 (Week 1): Pt to perform ascent/descent of 6 3-inch stairs with modA +1  Skilled Therapeutic Interventions/Progress Updates:    Pt received in w/c with no c/o pain and agreeable to treatment. Pt instructed in w/c propulsion with BLEs x50' with supervision; unable to perform further distance due to fatigue. Pt instructed in standing balance/NMR activities to improve postural alignment and L weight shifting. Pt able to demonstrate sit <>stand transfer from mat table x5 trials with min/CGA however upon standing requires mod/maxA and verbal cues to maintain initial standing balance, improved after 5-10 seconds. Pt demonstrates impulsive behaviors and requires repetitive cueing when losing balance or when form deteriorates and pt requested to return to sitting. Following activity BP measured 99/55 and variable HR reading on monitor, however taken manually at 110bpm; RN notified and advised therapist to continue with treatment as reading is near pt baseline. Pt instructed in gait training in hallway with RW and modA for two trials of approximately 31f. Initial trial demonstrated improved weight shifting and step symmetry; however with fatigue pt demonstrates inc R lateropulsion and dec safety. Pt requests to be finished with walking for the day due to fatigue and dizziness. In room  pt instructed in LE strengthening exercises; encouraged pt to perform later in th day when finished with all therapies to continue improving strength, coordination, and activity tolerance. Daughter LJeani Hawkingeducated in safety during stand pivot transfer as daughter has expressed interest in being cleared to transfer pt in room without A from staff. Daughter LJeani Hawkingdemonstrates stand pivot with pt for w/c >bed transfer. Encouraged family to remind pt to perform as much of transfer as possible without assistance including pushing with both hands from w/c. Pt performed sit > supine with supervision; remained supine at completion of session with family present and all needs within reach.  Therapy Documentation Precautions:  Restrictions Weight Bearing Restrictions: No Pain: Pain Assessment Pain Assessment: No/denies pain Pain Score: 0-No pain Locomotion : Ambulation Ambulation/Gait Assistance: 3: Mod assist Wheelchair Mobility Distance: 40   See FIM for current functional status  Therapy/Group: Individual Therapy  ELuberta Mutter6/23/2016, 2:55 PM

## 2015-02-24 NOTE — Progress Notes (Signed)
80 y.o. right handed female with history of hypertension, transient atrial fibrillation maintained on aspirin, diastolic congestive heart failure, COPD, recurrent bladder papillary carcinoma as well as breast cancer with mastectomy. Patient lives with husband and independent prior to admission. Presented 02/05/2015 with dizziness and nausea/vomiting as well as bouts of blurred vision. Denies any recent fall or head trauma. CT of the head showed a 4 cm acute hemorrhage within the right cerebellar hemisphere with surrounding edema and mass effect on the fourth ventricle as well as lacunar infarct of the right caudate head appeared to be remote. Underwent right retrosigmoid suboccipital craniotomy, evacuation of hematoma 02/05/2015 per Dr. Kathyrn Sheriff. Hospital course blood pressure management. Follow-up cranial CT scans shows that right cerebellar hematoma remains spanning over a 4.1 x 3.5 cm versus 4.4 x 3.5 cm as well as surrounding vasogenic edema mildly progressed Discharge to acute from CIR for Afib RVR, now rate is controlled and pt readmeitted Subjective/Complaints: No pain with urination , pt denies voiding issues, no SOB or CP ROS- denies bladder or bowel issues   Objective: Vital Signs: Blood pressure 115/78, pulse 101, temperature 98.2 F (36.8 C), temperature source Oral, resp. rate 16, height _0  (1.676 m), weight 78.155 kg (172 lb 4.8 oz), last menstrual period 12/03/2002, SpO2 95 %. No results found. Results for orders placed or performed during the hospital encounter of 02/22/15 (from the past 72 hour(s))  CBC WITH DIFFERENTIAL     Status: None   Collection Time: 02/23/15  7:51 AM  Result Value Ref Range   WBC 6.8 4.0 - 10.5 K/uL   RBC 4.53 3.87 - 5.11 MIL/uL   Hemoglobin 13.8 12.0 - 15.0 g/dL   HCT 43.1 36.0 - 46.0 %   MCV 95.1 78.0 - 100.0 fL   MCH 30.5 26.0 - 34.0 pg   MCHC 32.0 30.0 - 36.0 g/dL   RDW 14.4 11.5 - 15.5 %   Platelets 283 150 - 400 K/uL   Neutrophils Relative  % 53 43 - 77 %   Lymphocytes Relative 35 12 - 46 %   Monocytes Relative 7 3 - 12 %   Eosinophils Relative 4 0 - 5 %   Basophils Relative 1 0 - 1 %   Neutro Abs 3.5 1.7 - 7.7 K/uL   Lymphs Abs 2.4 0.7 - 4.0 K/uL   Monocytes Absolute 0.5 0.1 - 1.0 K/uL   Eosinophils Absolute 0.3 0.0 - 0.7 K/uL   Basophils Absolute 0.1 0.0 - 0.1 K/uL   WBC Morphology ATYPICAL LYMPHOCYTES    Smear Review LARGE PLATELETS PRESENT   Comprehensive metabolic panel     Status: Abnormal   Collection Time: 02/23/15  7:51 AM  Result Value Ref Range   Sodium 142 135 - 145 mmol/L   Potassium 4.2 3.5 - 5.1 mmol/L   Chloride 103 101 - 111 mmol/L   CO2 29 22 - 32 mmol/L   Glucose, Bld 100 (H) 65 - 99 mg/dL   BUN 10 6 - 20 mg/dL   Creatinine, Ser 0.57 0.44 - 1.00 mg/dL   Calcium 9.3 8.9 - 10.3 mg/dL   Total Protein 6.2 (L) 6.5 - 8.1 g/dL   Albumin 3.1 (L) 3.5 - 5.0 g/dL   AST 26 15 - 41 U/L   ALT 29 14 - 54 U/L   Alkaline Phosphatase 148 (H) 38 - 126 U/L   Total Bilirubin 0.4 0.3 - 1.2 mg/dL   GFR calc non Af Amer >60 >60 mL/min  GFR calc Af Amer >60 >60 mL/min    Comment: (NOTE) The eGFR has been calculated using the CKD EPI equation. This calculation has not been validated in all clinical situations. eGFR's persistently <60 mL/min signify possible Chronic Kidney Disease.    Anion gap 10 5 - 15     HEENT: nystagmus Cardio: irregular and tachy Resp: CTA B/L and unlabored GI: BS positive and non tender, non distended Extremity:  Pulses positive and No Edema Skin:   Intact Neuro: Confused and Abnormal FMC Ataxic Right FNF and Right Heel to Shin/ dec FMC Musc/Skel:  Normal Gen NAD   Assessment/Plan: 1. Functional deficits secondary to Right cerebellar ICH which require 3+ hours per day of interdisciplinary therapy in a comprehensive inpatient rehab setting. Physiatrist is providing close team supervision and 24 hour management of active medical problems listed below. Physiatrist and rehab team  continue to assess barriers to discharge/monitor patient progress toward functional and medical goals. FIM: FIM - Bathing Bathing Steps Patient Completed: Chest, Right Arm, Left Arm, Abdomen, Front perineal area, Buttocks, Right upper leg, Left upper leg Bathing: 4: Min-Patient completes 8-9 55f10 parts or 75+ percent  FIM - Upper Body Dressing/Undressing Upper body dressing/undressing steps patient completed: Thread/unthread left bra strap, Thread/unthread right sleeve of front closure shirt/dress, Thread/unthread left sleeve of front closure shirt/dress, Thread/unthread right bra strap, Button/unbutton shirt, Pull shirt around back of front closure shirt/dress Upper body dressing/undressing: 5: Supervision: Safety issues/verbal cues FIM - Lower Body Dressing/Undressing Lower body dressing/undressing steps patient completed: Thread/unthread right underwear leg, Pull underwear up/down, Don/Doff left shoe, Don/Doff right shoe Lower body dressing/undressing: 3: Mod-Patient completed 50-74% of tasks  FIM - Toileting Toileting steps completed by patient: Performs perineal hygiene Toileting Assistive Devices: Grab bar or rail for support Toileting: 2: Max-Patient completed 1 of 3 steps  FIM - TRadio producerDevices: Elevated toilet seat Toilet Transfers: 4-To toilet/BSC: Min A (steadying Pt. > 75%), 4-From toilet/BSC: Min A (steadying Pt. > 75%)  FIM - Bed/Chair Transfer Bed/Chair Transfer Assistive Devices: Arm rests Bed/Chair Transfer: 5: Supine > Sit: Supervision (verbal cues/safety issues), 5: Sit > Supine: Supervision (verbal cues/safety issues), 3: Bed > Chair or W/C: Mod A (lift or lower assist), 3: Chair or W/C > Bed: Mod A (lift or lower assist)  FIM - Locomotion: Wheelchair Distance: 40 Locomotion: Wheelchair: 1: Travels less than 50 ft with minimal assistance (Pt.>75%) FIM - Locomotion: Ambulation Locomotion: Ambulation Assistive Devices: WAstronomerAmbulation/Gait Assistance: 3: Mod assist Locomotion: Ambulation: 1: Travels less than 50 ft with moderate assistance (Pt: 50 - 74%)  Comprehension Comprehension Mode: Auditory Comprehension: 6-Follows complex conversation/direction: With extra time/assistive device  Expression Expression Mode: Verbal Expression: 5-Expresses complex 90% of the time/cues < 10% of the time  Social Interaction Social Interaction: 4-Interacts appropriately 75 - 89% of the time - Needs redirection for appropriate language or to initiate interaction.  Problem Solving Problem Solving: 4-Solves basic 75 - 89% of the time/requires cueing 10 - 24% of the time  Memory Memory: 5-Recognizes or recalls 90% of the time/requires cueing < 10% of the time  Medical Problem List and Plan: 1. Functional deficits secondary to right cerebellar hemorrhage status post craniotomy evacuation of hematoma 02/05/2015 2. DVT Prophylaxis/Anticoagulation: SCDs. Monitor for any signs of DVT 3. Pain Management: Tylenol as needed 4. Persistent atrial fibrillation with RVR. Cardizem 240 mg daily, Lopressor 75 mg twice a day. Cardiac rate controlled. Follow-up cardiology services. Not a candidate for anticoagulation  due to hemorrhage 5. Neuropsych: This patient is capable of making decisions on her own behalf. 6. Skin/Wound Care: Routine skin checks 7. Fluids/Electrolytes/Nutrition: Routine I&O with follow-up chemistries 8. Hyperlipidemia. Lipitor 9. Urinary retention. Urecholine 25 mg discontinued   PVR was 77m 10.  Hx of Right breast carcinoma , s/p axillary dissection no BPs RIght arm LOS (Days) 2 A FACE TO FACE EVALUATION WAS PERFORMED  KIRSTEINS,ANDREW E 02/24/2015, 6:32 AM

## 2015-02-24 NOTE — Progress Notes (Signed)
Occupational Therapy Session Note  Patient Details  Name: [name] MRN: [id number] Date of Birth: 03/12/1935  Today's Date: 02/24/2015 OT Individual Time: 5681-2751 and 7001-7494 OT Individual Time Calculation (min): 75 min and 30 min   Short Term Goals: Week 1:  OT Short Term Goal 1 (Week 1): Pt will perform 3/3 toileting tasks with steady A  OT Short Term Goal 2 (Week 1): Pt will complete LB dressing with min assist at sit > stand level OT Short Term Goal 3 (Week 1): Pt will complete toilet transfers with supervision assist with grab bars OT Short Term Goal 4 (Week 1): Pt will complete walk-in shower transfers with min assist OT Short Term Goal 5 (Week 1): Pt will complete 2 grooming tasks in standing with steady assist for standing balance  Skilled Therapeutic Interventions/Progress Updates:    1) Engaged in ADL retraining with focus on functional transfers, sit <> stand, and increased participation in self-care tasks.  Upon arrival, pt reports needing to toilet.  Performed squat pivot transfer bed > w/c with mod assist with pt requiring cues for forward weight shift.  Stand step transfer w/c > toilet with min assist and improved control at stand step level.  Educated daughter, [name], on need to allow pt increased time to initiate movement and space to allow her to weight shift forward as they tend to position themselves too close to pt.  Stand step transfer w/c > tub bench in walk-in shower with use of grab bars and min assist.  Pt completed bathing at sit > stand level with min assist for standing balance due to lean to Rt in standing, cues for proper foot placement prior to and during standing.  Dressing completed at sit > stand level at sink with focus on pushing up from w/c and not pulling up on sink, also encouraged pt to attempt LB dressing.  Encouraged pt to open eyes and visually attend to task to increase success with donning pants and socks.  Provided pt with small step stool  to increase participation in donning socks with ability to thread over toe but then reporting "I'm done".  Engaged in education on walk-in shower transfers with use of simulated shower box to simulate stepping over into shower.  Pt required mod assist and cues for RW management and body positioning with stepping over ledge and turning to sit on 3 in 1.  Pt impulsive with shower transfer requiring cues at trunk for control as pt attempting to sit prior to aligning body in front of 3 in 1.  Max cues for RW placement when exiting simulated shower with pt placing RW too far in front.  Pt with decreased awareness as reporting all these things will be fine when she gets home.  Pt returned to room and transferred back to bed with stand pivot and left semi-reclined in bed.  2) Engaged in transfer training with pt and daughter's, [name] and Jeani Hawking with focus on hands on education with transfers.  Pt in bed upon arrival, requiring increased time to wake fully.  Mod assist stand pivot bed > w/c secondary to increased pushing to Rt due to fatigue and impulsivity.  Engaged in simulated walk-in shower transfers with RW, incorporating stepping over approx 4" ledge and ambulating to 3 in 1 as setup would allow at home.  Pt's daughters report large walk-in shower allowing 1-2 daughters in shower as well to assist with transfer and/or bathing as needed.  Pt with increased impulsivity this session  secondary to fatigue and reporting "I'm still not awake".  Pt returned to room and completed stand step transfer with daughter with min-mod assist and cues for RLE placement from therapist to increase safety.  Pt left with daughters in room with plan to transfer back to bed.  Therapy Documentation Precautions:  Restrictions Weight Bearing Restrictions: No Pain:  Pt with no c/o pain  See FIM for current functional status  Therapy/Group: Individual Therapy  Simonne Come 02/24/2015, 12:06 PM

## 2015-02-24 NOTE — Progress Notes (Signed)
Speech Language Pathology Daily Session Note  Patient Details  Name: [name] MRN: [id number] Date of Birth: [birth year]  Today's Date: 02/24/2015 SLP Individual Time: 1035-1105 SLP Individual Time Calculation (min): 30 min  Short Term Goals: Week 1: SLP Short Term Goal 1 (Week 1): Pt will recognize and correct errors with Supervision question cues during semi-complex self care and/or home management tasks.  SLP Short Term Goal 2 (Week 1): Pt will anticipate at least 2 ways in which her current deficits will impact her ability to complete self care/home management tasks upon discharge and will generate appropriate solutions to problems with supervision level quesion cues.   Skilled Therapeutic Interventions: Skilled treatment session focused on addressing cognitive goals. SLP facilitated session by providing extra time and Min A verbal and visual cues for functional problem solving during a basic structured task.  Patient with emergent awareness of errors; however, she was unable to correct without assist from SLP or daughters.  Patient appeared more fatigued today, which appeared to impact function.  Moments of anomia were also observed today.  Continue with current plan of care.    FIM:  Comprehension Comprehension Mode: Auditory Comprehension: 5-Follows basic conversation/direction: With extra time/assistive device Expression Expression Mode: Verbal Expression: 4-Expresses basic 75 - 89% of the time/requires cueing 10 - 24% of the time. Needs helper to occlude trach/needs to repeat words. Social Interaction Social Interaction: 5-Interacts appropriately 90% of the time - Needs monitoring or encouragement for participation or interaction. Problem Solving Problem Solving: 4-Solves basic 75 - 89% of the time/requires cueing 10 - 24% of the time Memory Memory: 5-Recognizes or recalls 90% of the time/requires cueing < 10% of the time  Pain Pain Assessment Pain Assessment:  No/denies pain  Therapy/Group: Individual Therapy  Carmelia Roller., [place] [phone]  [place] 02/24/2015, 12:37 PM

## 2015-02-25 ENCOUNTER — Inpatient Hospital Stay (HOSPITAL_COMMUNITY): Payer: Medicare Other | Admitting: Speech Pathology

## 2015-02-25 ENCOUNTER — Inpatient Hospital Stay (HOSPITAL_COMMUNITY): Payer: Medicare Other | Admitting: Occupational Therapy

## 2015-02-25 ENCOUNTER — Inpatient Hospital Stay (HOSPITAL_COMMUNITY): Payer: Medicare Other | Admitting: Physical Therapy

## 2015-02-25 MED ORDER — METOPROLOL TARTRATE 25 MG PO TABS
37.5000 mg | ORAL_TABLET | Freq: Two times a day (BID) | ORAL | Status: DC
  Administered 2015-02-25 – 2015-02-28 (×5): 37.5 mg via ORAL
  Filled 2015-02-25 (×10): qty 1

## 2015-02-25 MED ORDER — DOCUSATE SODIUM 100 MG PO CAPS
100.0000 mg | ORAL_CAPSULE | Freq: Two times a day (BID) | ORAL | Status: DC
  Administered 2015-02-25 – 2015-02-28 (×6): 100 mg via ORAL
  Filled 2015-02-25 (×8): qty 1

## 2015-02-25 NOTE — Progress Notes (Signed)
Occupational Therapy Session Note  Patient Details  Name: [name] MRN: [id number] Date of Birth: [birth year]  Today's Date: 02/25/2015 OT Individual Time: 0905-1000 and 1100-1155 OT Individual Time Calculation (min): 55 min and 55 min   Short Term Goals: Week 1:  OT Short Term Goal 1 (Week 1): Pt will perform 3/3 toileting tasks with steady A  OT Short Term Goal 2 (Week 1): Pt will complete LB dressing with min assist at sit > stand level OT Short Term Goal 3 (Week 1): Pt will complete toilet transfers with supervision assist with grab bars OT Short Term Goal 4 (Week 1): Pt will complete walk-in shower transfers with min assist OT Short Term Goal 5 (Week 1): Pt will complete 2 grooming tasks in standing with steady assist for standing balance  Skilled Therapeutic Interventions/Progress Updates:    1) Engaged in ADL retraining with focus on functional transfers, sit <> stand, and increased participation in LB dressing tasks.  Bathing and dressing completed at sit > stand level with pt completing stand pivot transfer w/c > tub bench in room shower with min assist and use of grab bars.  Educated daughter, [name], about recommendation for grab bar in shower at home to increase safety and independence.  Verbal cues for RLE placement with sit > stand and while standing due to tendency to lean Rt in standing.  Dressing completed with setup assist for UB dressing and mod cues to increase participation in LB dressing.  Utilized step stool to increase independence with donning socks and shoes.  Daughter provided therapist with measurements of bathroom setup and bed height.  Engaged in transfer training with daughter with stand pivot transfers to bed height 27.5" with pt requiring mod assist progressing to min assist.    2) Engaged in therapeutic activity with focus on functional mobility, sit <> stand, and sitting balance.  Engaged in functional transfers with RW with focus on transfers to  "dressing bench" as pt would sit on bench in closet to don socks and shoes.  Pt unable to stand safely from 17" height (as measured by daughter) without requiring mod-max assist secondary to pushing to Rt.  Encouraged pt to chose a sturdy arm chair for LB dressing to increase safety and independence.  Ambulated short distances with RW with focus on stepping over ski pole on floor to practice sequencing of RW as needed for stepping in/out of walk-in shower.  Pt requires min-mod assist and mod verbal cues for RW technique as she tends to push it too far in front, especially when lifting RW over obstacle. Pt with increased pushing to Rt as she fatigues.  Pt reports "I'm done" and requested to return to room.  Performed stand pivot transfer w/c > bed with min assist to elevated bed height similar to home setup.  Pt will benefit from additional practice with transfers and walk-in shower transfer.  Therapy Documentation Precautions:  Restrictions Weight Bearing Restrictions: No General:   Vital Signs: Therapy Vitals Pulse Rate: 99 BP: 119/77 mmHg Pain:  Pt with no c/o pain  See FIM for current functional status  Therapy/Group: Individual Therapy  Simonne Come 02/25/2015, 12:18 PM

## 2015-02-25 NOTE — Progress Notes (Signed)
Speech Language Pathology Daily Session Note  Patient Details  Name: MEHAK ROSKELLEY MRN: [id number] Date of Birth: 01-03-1935  Today's Date: 02/25/2015 SLP Individual Time: 1030-1100 SLP Individual Time Calculation (min): 30 min  Short Term Goals: Week 1: SLP Short Term Goal 1 (Week 1): Pt will recognize and correct errors with Supervision question cues during semi-complex self care and/or home management tasks.  SLP Short Term Goal 2 (Week 1): Pt will anticipate at least 2 ways in which her current deficits will impact her ability to complete self care/home management tasks upon discharge and will generate appropriate solutions to problems with supervision level quesion cues.   Skilled Therapeutic Interventions: Skilled treatment session focused on addressing cognitive goals. Patient completed a semi-complex problem solving task with Supervision level question cues to identify errors and Min verbal cues to correct.  Daughter present for session and cued patient appropriately today with subtle questions.  Anomia also present during today's session; SLP will monitor in next couple sessions. Continue with current plan of care.    FIM:  Comprehension Comprehension Mode: Auditory Comprehension: 5-Follows basic conversation/direction: With extra time/assistive device Expression Expression Mode: Verbal Expression: 4-Expresses basic 75 - 89% of the time/requires cueing 10 - 24% of the time. Needs helper to occlude trach/needs to repeat words. Social Interaction Social Interaction: 5-Interacts appropriately 90% of the time - Needs monitoring or encouragement for participation or interaction. Problem Solving Problem Solving: 4-Solves basic 75 - 89% of the time/requires cueing 10 - 24% of the time Memory Memory: 5-Recognizes or recalls 90% of the time/requires cueing < 10% of the time  Pain Pain Assessment Pain Assessment: No/denies pain  Therapy/Group: Individual Therapy  Carmelia Roller., CCC-SLP [phone]  [place] 02/25/2015, 12:56 PM

## 2015-02-25 NOTE — Progress Notes (Signed)
Subjective:   Asked to see patient again over concerns of bradycardia. Recently seen by Dr. Caryl Comes due to labile HRs on Dinamap. Transferred to tele for 24 hours and lopressor and diltiazem adjusted. Documented HRs running 60-109.   Feels ok. Appetite improving. Doing well with rehab. Denies palpitations. Metoprolol held last 2 days due to "low BP" but documented BPs have been ok. Using automatic cuff not manual    Intake/Output Summary (Last 24 hours) at 02/25/15 1433 Last data filed at 02/25/15 0845  Gross per 24 hour  Intake    260 ml  Output    100 ml  Net    160 ml    Current meds: . atorvastatin  10 mg Oral q1800  . diltiazem  240 mg Oral Daily  . latanoprost  1 drop Both Eyes QHS  . metoprolol tartrate  75 mg Oral BID  . pantoprazole  40 mg Oral Daily   Infusions:     Objective:  Blood pressure 119/77, pulse 99, temperature 98.2 F (36.8 C), temperature source Oral, resp. rate 16, height _0  (1.676 m), weight 78.155 kg (172 lb 4.8 oz), last menstrual period 12/03/2002, SpO2 97 %. Weight change:   Physical Exam: General:  Elderly lying flat in bed NAD HEENT: normal Neck: supple. JVP 6. Carotids 2+ bilat; no bruits. No lymphadenopathy or thryomegaly appreciated. Cor: PMI nondisplaced. Irregular tachy Lungs: clear Abdomen: soft, nontender, nondistended. No hepatosplenomegaly. No bruits or masses. Good bowel sounds. Extremities: no cyanosis, clubbing, rash, edema Neuro: alert & orientedx3, . Affect pleasant   Lab Results: Basic Metabolic Panel:  Recent Labs Lab 02/21/15 0429 02/23/15 0751  NA 139 142  K 3.8 4.2  CL 100* 103  CO2 32 29  GLUCOSE 91 100*  BUN 13 10  CREATININE 0.54 0.57  CALCIUM 8.5* 9.3  MG 2.1  --    Liver Function Tests:  Recent Labs Lab 02/23/15 0751  AST 26  ALT 29  ALKPHOS 148*  BILITOT 0.4  PROT 6.2*  ALBUMIN 3.1*   No results for input(s): LIPASE, AMYLASE in the last 168 hours. No results for input(s): AMMONIA in  the last 168 hours. CBC:  Recent Labs Lab 02/23/15 0751  WBC 6.8  NEUTROABS 3.5  HGB 13.8  HCT 43.1  MCV 95.1  PLT 283   Cardiac Enzymes: No results for input(s): CKTOTAL, CKMB, CKMBINDEX, TROPONINI in the last 168 hours. BNP: Invalid input(s): POCBNP CBG: No results for input(s): GLUCAP in the last 168 hours. Microbiology: Lab Results  Component Value Date   CULT  02/19/2015    MULTIPLE SPECIES PRESENT, SUGGEST RECOLLECTION IF CLINICALLY INDICATED   CULT >=100,000 COLONIES/mL ESCHERICHIA COLI 02/16/2015   CULT NO GROWTH Performed at [facility]  02/07/2015   CULT  02/05/2015    NO GROWTH 5 DAYS Performed at [place]  02/05/2015    NO GROWTH 5 DAYS Performed at [place] Lab 02/19/15 1921  CULT MULTIPLE SPECIES PRESENT, SUGGEST RECOLLECTION IF CLINICALLY INDICATED  SDES URINE, CLEAN CATCH    Imaging: No results found.   ASSESSMENT:  1. Persistent atrial fibrillation-  CHA2DS2-Vasc score 5 (stroke, female, age), no systemic anticoagulation given recent hemorrhagic stroke.  2. Hemorrhagic cerebellar stroke: 2 wks ago, with R sided visual and strength deficit.  3. HTN: controlled on diltiazem and metoprolol  4. COPD  PLAN/DISCUSSION:  Her AF rate is up (112 bpm)  in the  setting of metoprolol being held due to reported low BP. I took BP manually and SBP 116.   Would restart lopressor at 37.5 bid and only hold if SBP < 100. I left manual cuff in room so if BP not reliable with automatic cuff please take manually. If BP goes too low may be helpful to switch diltiazem out for higher dose of lopressor.   D/w daughters at bedside.    LOS: 3 days    Glori Bickers, MD 02/25/2015, 2:33 PM

## 2015-02-25 NOTE — IPOC Note (Signed)
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   Atrial fibrillation     Functional Problem List: Nursing Safety, Motor, Skin Integrity  PT Balance, Endurance, Motor, Perception, Safety  OT Balance, Cognition, Edema, Endurance, Motor, Perception, Safety, Sensory, Vision  SLP    TR         Basic ADL's: OT Grooming, Bathing, Dressing, Toileting     Advanced  ADL's: OT       Transfers: PT Bed Mobility, Bed to Chair, Teacher, early years/pre, Tub/Shower     Locomotion: PT Ambulation, Emergency planning/management officer, Stairs     Additional Impairments: OT Fuctional Use of Upper Extremity  SLP        TR      Anticipated Outcomes Item Anticipated Outcome  Self Feeding n/a  Swallowing      Basic self-care  supervision  Toileting  supervision   Bathroom Transfers supervision  Bowel/Bladder  continent with Bowel and bladder LBM 02/22/15 Small Bm per pt/ daughter  Transfers  supervision  Locomotion  supervision  Communication     Cognition     Pain  Pain level 3 or less  Safety/Judgment  Pt will be free of fall by calling for assist at all time  as needed   Therapy Plan: PT Intensity: Minimum of 1-2 x/day ,45 to 90 minutes PT Frequency: 5 out of 7 days PT Duration Estimated Length of Stay: 14-16 days from re-eval OT Intensity: Minimum of 1-2 x/day, 45 to 90 minutes OT Frequency: 5 out of 7 days OT Duration/Estimated Length of Stay: 7-10days         Team Interventions: Nursing Interventions Patient/Family Education, Skin Care/Wound Management, Discharge Planning, Bladder Management, Bowel Management, Disease Management/Prevention, Pain Management, Medication Management, Psychosocial Support  PT interventions Ambulation/gait training, Medical illustrator  training, DME/adaptive equipment instruction, Discharge planning, Functional mobility training, Therapeutic Activities, Therapeutic Exercise, Wheelchair propulsion/positioning, UE/LE Strength taining/ROM, UE/LE Coordination activities, Stair training, Patient/family education, Neuromuscular re-education, Community reintegration, Visual/perceptual remediation/compensation  OT Interventions Training and development officer, Discharge planning, Disease mangement/prevention, DME/adaptive equipment instruction, Functional mobility training, Neuromuscular re-education, Pain management, Patient/family education, Psychosocial support, Self Care/advanced ADL retraining, Therapeutic Activities, Therapeutic Exercise, UE/LE Strength taining/ROM, UE/LE Coordination activities, Visual/perceptual remediation/compensation, Cognitive remediation/compensation, Academic librarian, Wheelchair propulsion/positioning  SLP Interventions    TR Interventions    SW/CM Interventions Discharge Planning, Barrister's clerk, Patient/Family Education    Team Discharge Planning: Destination: PT-Home ,OT- Home , SLP-  Projected Follow-up: PT-24 hour supervision/assistance, Outpatient PT, OT-  Outpatient OT, 24 hour supervision/assistance, SLP-  Projected Equipment Needs: PT-To be determined, OT- Tub/shower seat, To be determined, SLP-  Equipment Details: PT- , OT-  Patient/family involved in discharge planning: PT- Family member/caregiver, Patient,  OT-Patient, Family member/caregiver, SLP-   MD ELOS: 10-14d Medical Rehab Prognosis:  Good Assessment: 80 y.o. right handed female with history of hypertension, transient atrial fibrillation maintained on aspirin, diastolic congestive heart failure, COPD, recurrent bladder papillary carcinoma as well as breast cancer with mastectomy. Patient lives with husband and independent prior to admission. Presented 02/05/2015 with dizziness and nausea/vomiting as well as bouts of blurred vision.  Denies any recent fall or head trauma. CT of the head showed a 4 cm acute hemorrhage within the right cerebellar hemisphere with surrounding edema and mass effect on the fourth ventricle as well as lacunar infarct  of the right caudate head appeared to be remote. Underwent right retrosigmoid suboccipital craniotomy, evacuation of hematoma 02/05/2015 per Dr. Kathyrn Sheriff. Hospital course blood pressure management. Now requiring 24/7 Rehab RN,MD, as well as CIR level PT, OT and SLP.  Treatment team will focus on ADLs and mobility with goals set at Sup  See Team Conference Notes for weekly updates to the plan of care

## 2015-02-25 NOTE — Progress Notes (Signed)
80 y.o. right handed female with history of hypertension, transient atrial fibrillation maintained on aspirin, diastolic congestive heart failure, COPD, recurrent bladder papillary carcinoma as well as breast cancer with mastectomy. Patient lives with husband and independent prior to admission. Presented 02/05/2015 with dizziness and nausea/vomiting as well as bouts of blurred vision. Denies any recent fall or head trauma. CT of the head showed a 4 cm acute hemorrhage within the right cerebellar hemisphere with surrounding edema and mass effect on the fourth ventricle as well as lacunar infarct of the right caudate head appeared to be remote. Underwent right retrosigmoid suboccipital craniotomy, evacuation of hematoma 02/05/2015 per Dr. Kathyrn Sheriff. Hospital course blood pressure management. Follow-up cranial CT scans shows that right cerebellar hematoma remains spanning over a 4.1 x 3.5 cm versus 4.4 x 3.5 cm as well as surrounding vasogenic edema mildly progressed Discharge to acute from CIR for Afib RVR, now rate is controlled and pt readmeitted Subjective/Complaints: No pain with urination , pt denies voiding issues, no SOB or CP ROS- denies bladder or bowel issues   Objective: Vital Signs: Blood pressure 120/73, pulse 60, temperature 98.2 F (36.8 C), temperature source Oral, resp. rate 16, height _0  (1.676 m), weight 78.155 kg (172 lb 4.8 oz), last menstrual period 12/03/2002, SpO2 97 %. No results found. Results for orders placed or performed during the hospital encounter of 02/22/15 (from the past 72 hour(s))  CBC WITH DIFFERENTIAL     Status: None   Collection Time: 02/23/15  7:51 AM  Result Value Ref Range   WBC 6.8 4.0 - 10.5 K/uL   RBC 4.53 3.87 - 5.11 MIL/uL   Hemoglobin 13.8 12.0 - 15.0 g/dL   HCT 43.1 36.0 - 46.0 %   MCV 95.1 78.0 - 100.0 fL   MCH 30.5 26.0 - 34.0 pg   MCHC 32.0 30.0 - 36.0 g/dL   RDW 14.4 11.5 - 15.5 %   Platelets 283 150 - 400 K/uL   Neutrophils Relative  % 53 43 - 77 %   Lymphocytes Relative 35 12 - 46 %   Monocytes Relative 7 3 - 12 %   Eosinophils Relative 4 0 - 5 %   Basophils Relative 1 0 - 1 %   Neutro Abs 3.5 1.7 - 7.7 K/uL   Lymphs Abs 2.4 0.7 - 4.0 K/uL   Monocytes Absolute 0.5 0.1 - 1.0 K/uL   Eosinophils Absolute 0.3 0.0 - 0.7 K/uL   Basophils Absolute 0.1 0.0 - 0.1 K/uL   WBC Morphology ATYPICAL LYMPHOCYTES    Smear Review LARGE PLATELETS PRESENT   Comprehensive metabolic panel     Status: Abnormal   Collection Time: 02/23/15  7:51 AM  Result Value Ref Range   Sodium 142 135 - 145 mmol/L   Potassium 4.2 3.5 - 5.1 mmol/L   Chloride 103 101 - 111 mmol/L   CO2 29 22 - 32 mmol/L   Glucose, Bld 100 (H) 65 - 99 mg/dL   BUN 10 6 - 20 mg/dL   Creatinine, Ser 0.57 0.44 - 1.00 mg/dL   Calcium 9.3 8.9 - 10.3 mg/dL   Total Protein 6.2 (L) 6.5 - 8.1 g/dL   Albumin 3.1 (L) 3.5 - 5.0 g/dL   AST 26 15 - 41 U/L   ALT 29 14 - 54 U/L   Alkaline Phosphatase 148 (H) 38 - 126 U/L   Total Bilirubin 0.4 0.3 - 1.2 mg/dL   GFR calc non Af Amer >60 >60 mL/min  GFR calc Af Amer >60 >60 mL/min    Comment: (NOTE) The eGFR has been calculated using the CKD EPI equation. This calculation has not been validated in all clinical situations. eGFR's persistently <60 mL/min signify possible Chronic Kidney Disease.    Anion gap 10 5 - 15     HEENT: nystagmus Cardio: irregular and tachy Resp: CTA B/L and unlabored GI: BS positive and non tender, non distended Extremity:  Pulses positive and No Edema Skin:   Intact Neuro: Confused and Abnormal FMC Ataxic Right FNF and Right Heel to Shin/ dec FMC, motor 4+ om Right side 5/5 on left Musc/Skel:  Normal Gen NAD   Assessment/Plan: 1. Functional deficits secondary to Right cerebellar ICH which require 3+ hours per day of interdisciplinary therapy in a comprehensive inpatient rehab setting. Physiatrist is providing close team supervision and 24 hour management of active medical problems listed  below. Physiatrist and rehab team continue to assess barriers to discharge/monitor patient progress toward functional and medical goals. FIM: FIM - Bathing Bathing Steps Patient Completed: Chest, Right Arm, Left Arm, Abdomen, Front perineal area, Buttocks, Right upper leg, Left upper leg Bathing: 4: Min-Patient completes 8-9 [id number] parts or 75+ percent  FIM - Upper Body Dressing/Undressing Upper body dressing/undressing steps patient completed: Thread/unthread left bra strap, Thread/unthread right sleeve of front closure shirt/dress, Thread/unthread left sleeve of front closure shirt/dress, Thread/unthread right bra strap, Button/unbutton shirt, Pull shirt around back of front closure shirt/dress, Hook/unhook bra Upper body dressing/undressing: 5: Supervision: Safety issues/verbal cues FIM - Lower Body Dressing/Undressing Lower body dressing/undressing steps patient completed: Thread/unthread right underwear leg, Thread/unthread left underwear leg, Pull underwear up/down, Thread/unthread right pants leg, Thread/unthread left pants leg, Pull pants up/down, Don/Doff right shoe, Don/Doff left shoe Lower body dressing/undressing: 3: Mod-Patient completed 50-74% of tasks  FIM - Toileting Toileting steps completed by patient: Performs perineal hygiene Toileting Assistive Devices: Grab bar or rail for support Toileting: 2: Max-Patient completed 1 of 3 steps  FIM - TRadio producerDevices: Elevated toilet seat Toilet Transfers: 4-To toilet/BSC: Min A (steadying Pt. > 75%), 4-From toilet/BSC: Min A (steadying Pt. > 75%)  FIM - Bed/Chair Transfer Bed/Chair Transfer Assistive Devices: Bed rails, Arm rests Bed/Chair Transfer: 5: Supine > Sit: Supervision (verbal cues/safety issues), 5: Sit > Supine: Supervision (verbal cues/safety issues), 3: Bed > Chair or W/C: Mod A (lift or lower assist), 3: Chair or W/C > Bed: Mod A (lift or lower assist)  FIM - Locomotion:  Wheelchair Distance: 40 Locomotion: Wheelchair: 1: Travels less than 50 ft with supervision, cueing or coaxing FIM - Locomotion: Ambulation Locomotion: Ambulation Assistive Devices: WAdministratorAmbulation/Gait Assistance: 3: Mod assist Locomotion: Ambulation: 1: Travels less than 50 ft with moderate assistance (Pt: 50 - 74%)  Comprehension Comprehension Mode: Auditory Comprehension: 5-Follows basic conversation/direction: With extra time/assistive device  Expression Expression Mode: Verbal Expression: 4-Expresses basic 75 - 89% of the time/requires cueing 10 - 24% of the time. Needs helper to occlude trach/needs to repeat words.  Social Interaction Social Interaction: 5-Interacts appropriately 90% of the time - Needs monitoring or encouragement for participation or interaction.  Problem Solving Problem Solving: 4-Solves basic 75 - 89% of the time/requires cueing 10 - 24% of the time  Memory Memory: 5-Recognizes or recalls 90% of the time/requires cueing < 10% of the time  Medical Problem List and Plan: 1. Functional deficits secondary to right cerebellar hemorrhage status post craniotomy evacuation of hematoma 02/05/2015 2. DVT Prophylaxis/Anticoagulation: SCDs. Monitor for  any signs of DVT 3. Pain Management: Tylenol as needed 4. Persistent atrial fibrillation with RVR. Cardizem 240 mg daily, Lopressor 75 mg twice a day. Cardiac rate controlled. Follow-up cardiology services. Not a candidate for anticoagulation due to hemorrhage 5. Neuropsych: This patient is capable of making decisions on her own behalf. 6. Skin/Wound Care: Routine skin checks 7. Fluids/Electrolytes/Nutrition: Routine I&O with follow-up chemistries 8. Hyperlipidemia. Lipitor 9. Urinary retention. Urecholine 25 mg discontinued   PVR was 62m 10.  Hx of Right breast carcinoma , s/p axillary dissection no BPs RIght arm LOS (Days) 3 A FACE TO FACE EVALUATION WAS PERFORMED  KIRSTEINS,ANDREW E 02/25/2015, 6:39  AM

## 2015-02-25 NOTE — Progress Notes (Signed)
Physical Therapy Session Note  Patient Details  Name: [name] MRN: [id number] Date of Birth: 06-01-1935  Today's Date: 02/25/2015 PT Individual Time: 1408-1500 PT Individual Time Calculation (min): 52 min   Short Term Goals: Week 1:  PT Short Term Goal 1 (Week 1): Pt to perform bed mobility with supevision PT Short Term Goal 1 - Progress (Week 1): Met PT Short Term Goal 2 (Week 1): Pt will perform bed <> chair transfers with consistent Min A PT Short Term Goal 3 (Week 1): Pt to demonstrate w/c propulsion 100 ft supervision PT Short Term Goal 4 (Week 1): Pt to ambulate 75 ft with LRAD and modA +1 PT Short Term Goal 5 (Week 1): Pt to perform ascent/descent of 6 3-inch stairs with modA +1  Skilled Therapeutic Interventions/Progress Updates:    Pt received supine in bed with no c/o pain and agreeable to treatment. Performed bed mobility and supine >sit with supervision, stand pivot transfer bed > w/c with minA. Pt instructed in gait with RW and variable min/modA. Overall noted improved standing balance, reduced R lateropulsion, and improving symmetry of steps and reduced ataxia. Pt performs 2 trials of approximately 30', second trial including turn into therapy gym. With turning L pt demonstrates reduced postural control and inc R lateropulsion, as well as with urgency to sit on mat table quality/safety of gait deteriorates. Pt comments throughout session how happy she is that she is improving. Continues to require cues to perform as much of task as possible and reduce reliance on external support and assistance. Pt instructed in standing balance activity while catching/throwing beach ball with daughter, therapist and other daughter providing +2 standby A and occasional min/mod A to recover LOB. Overall pt demonstrates increased postural sway however improving ankle strategy to recover without assistance. Daughters again require education regarding allowing pt to do as much as possible, that  minor LOB allow pt to re-learn balance strategies and immediate assistance is not required as soon as pt sways. Pt returned to room and performed stand step transfer w/c <> raised toilet seat with minA. Educated pt and family regarding x1 viewing exercise to A with VOR retraining and reduce dizziness. Explained importance of pt keeping focus on "x" with head turns, progression of time performed and speed of head turns. Pt demonstrates exercises x3 trials with rest breaks between due to inc symptoms of dizziness. Pt returned to bed with minA and remained supine in bed with family present and all needs within reach.   Therapy Documentation Precautions:  Restrictions Weight Bearing Restrictions: No  Pain: Pain Assessment Pain Assessment: No/denies pain Pain Score: 0-No pain Locomotion : Ambulation Ambulation/Gait Assistance: 4: Min assist   See FIM for current functional status  Therapy/Group: Individual Therapy  Luberta Mutter 02/25/2015, 3:31 PM

## 2015-02-25 NOTE — Plan of Care (Signed)
Problem: RH Toilet Transfers Goal: LTG Patient will perform toilet transfers w/assist (OT) LTG: Patient will perform toilet transfers with assist, with/without cues using equipment (OT)  Downgraded due to slow progress and pt willing to provide min assist at discharge.  Problem: RH Tub/Shower Transfers Goal: LTG Patient will perform tub/shower transfers w/assist (OT) LTG: Patient will perform tub/shower transfers with assist, with/without cues using equipment (OT)  Downgraded due to slow progress and pt willing to provide min assist at discharge.

## 2015-02-26 ENCOUNTER — Inpatient Hospital Stay (HOSPITAL_COMMUNITY): Payer: Medicare Other | Admitting: Physical Therapy

## 2015-02-26 ENCOUNTER — Inpatient Hospital Stay (HOSPITAL_COMMUNITY): Payer: Medicare Other | Admitting: Speech Pathology

## 2015-02-26 DIAGNOSIS — I481 Persistent atrial fibrillation: Secondary | ICD-10-CM

## 2015-02-26 NOTE — Progress Notes (Signed)
Physical Therapy Session Note  Patient Details  Name: [name] MRN: [id number] Date of Birth: [birth year]  Today's Date: 02/26/2015 PT Individual Time:  -  1300-1400 Treatment Time: 60 min     Short Term Goals: Week 1:  PT Short Term Goal 1 (Week 1): Pt to perform bed mobility with supevision PT Short Term Goal 1 - Progress (Week 1): Met PT Short Term Goal 2 (Week 1): Pt will perform bed <> chair transfers with consistent Min A PT Short Term Goal 3 (Week 1): Pt to demonstrate w/c propulsion 100 ft supervision PT Short Term Goal 4 (Week 1): Pt to ambulate 75 ft with LRAD and modA +1 PT Short Term Goal 5 (Week 1): Pt to perform ascent/descent of 6 3-inch stairs with modA +1  Skilled Therapeutic Interventions/Progress Updates:    Therapeutic Activity: Pt received in bed with daughter and SIL present. Pt demonstrates supine to sit from flat bed without rails req SBA, sit to stand from eob req CGA-min A, and stand-step transfer without AD bed to w/c req min A for balance. Daughter req repeated verbal cues for hands off of pt. PT instructs pt in stand-step transfer w/c to bed to R req mod A for balance due to strong R lean and inability to weight shift L without support to take a lateral step to the R.   Gait Training: PT instructs pt in ambulation with RW x 93' req up to mod A due to pitch forward, narrow BOS, and R lateral lean with fatigue.   W/C Management: PT instructs pt in w/c propulsion with B LEs (and B UEs prn req verbal cues for arm steering) req SBA and encouragement for distance of 160'.   Therapeutic Exercise: PT instructs pt in seated x1 viewing exercises: 2 x 10 reps.  PT instructs pt in bed level B LE rom & strengthening exercises: SLR, bridges, side lie hip abduction: 3 x 10 reps each (except 2 x 10 reps for L LE side lie hip abduction).   Pt's BP measured to be low after ambulation, so stairs not done. Pt assisted back to bed and participates in bed level  exercises. Pt reports she will not do bridges or side lie hip abduction after today, b/c she thinks the exercises are stupid. Continue per PT POC.   Therapy Documentation Precautions:  Restrictions Weight Bearing Restrictions: No Vital Signs: Therapy Vitals Pulse Rate: 84 BP: (!) 88/61 mmHg (after ambulation) Patient Position (if appropriate): Sitting Oxygen Therapy SpO2: 98 % O2 Device: Not Delivered Pain: Pain Assessment Pain Assessment: No/denies pain  See FIM for current functional status  Therapy/Group: Individual Therapy  Kynlee Koenigsberg M 02/26/2015, 8:00 AM

## 2015-02-26 NOTE — Progress Notes (Signed)
Speech Language Pathology Daily Session Note  Patient Details  Name: MERYL HUBERS MRN: [id number] Date of Birth: February 21, 1935  Today's Date: 02/26/2015 SLP Individual Time: 9704-4925 SLP Individual Time Calculation (min): 27 min  Short Term Goals: Week 1: SLP Short Term Goal 1 (Week 1): Pt will recognize and correct errors with Supervision question cues during semi-complex self care and/or home management tasks.  SLP Short Term Goal 2 (Week 1): Pt will anticipate at least 2 ways in which her current deficits will impact her ability to complete self care/home management tasks upon discharge and will generate appropriate solutions to problems with supervision level quesion cues.   Skilled Therapeutic Interventions: Skilled treatment session focused on cognitive goals.  SLP facilitated session by providing for min A balancing checkbook scenario.  She required Supervision-Mod Independence selectively attending to the balance information, using calculator with support due to fine motor deficits, and the calculations of the balance underneath the balance information.  She recalled the most recent balance information and the math operation needed with Mod Independence.  She required Supervision to Mod-Independence with answering functional daily living word problems using basic math functions and fractions; patient required min A figuring the answer to fraction word problems.  Continue with current plan of care   FIM:  Comprehension Comprehension Mode: Auditory Comprehension: 5-Follows basic conversation/direction: With no assist Expression Expression Mode: Verbal Expression: 5-Expresses basic 90% of the time/requires cueing < 10% of the time. Social Interaction Social Interaction: 5-Interacts appropriately 90% of the time - Needs monitoring or encouragement for participation or interaction. Problem Solving Problem Solving: 5-Solves basic 90% of the time/requires cueing < 10% of the  time Memory Memory: 5-Recognizes or recalls 90% of the time/requires cueing < 10% of the time FIM - Eating Eating Activity: 5: Set-up assist for open containers  Pain Pain Assessment Pain Assessment: No/denies pain  Therapy/Group: Individual Therapy   Annabell Howells, MA CCC-SLP Annabell Howells A 02/26/2015, 3:01 PM

## 2015-02-26 NOTE — Progress Notes (Signed)
Patient ID: YARNELL KOZLOSKI, female   DOB: 01-07-1935, 80 y.o.   MRN: [id number]     Subjective:    No complaints  Objective:   Temp:  [98.1 F (36.7 C)-98.2 F (36.8 C)] 98.2 F (36.8 C) (06/25 0542) Pulse Rate:  [87-93] 93 (06/25 0542) Resp:  [18] 18 (06/25 0542) BP: (116-121)/(67-74) 116/74 mmHg (06/25 0542) SpO2:  [95 %-97 %] 95 % (06/25 0542) Last BM Date: 02/22/15  Filed Weights   02/22/15 1647  Weight: 172 lb 4.8 oz (78.155 kg)    Intake/Output Summary (Last 24 hours) at 02/26/15 0853 Last data filed at 02/26/15 0700  Gross per 24 hour  Intake    240 ml  Output      0 ml  Net    240 ml    Exam:  General: NAD   Resp: CTAB  Cardiac: irreg, no m/r/g,   WC:BJSEGBT soft, NT  MSK: no LE edema  Psych: appropriate affect  Lab Results:  Basic Metabolic Panel:  Recent Labs Lab 02/21/15 0429 02/23/15 0751  NA 139 142  K 3.8 4.2  CL 100* 103  CO2 32 29  GLUCOSE 91 100*  BUN 13 10  CREATININE 0.54 0.57  CALCIUM 8.5* 9.3  MG 2.1  --     Liver Function Tests:  Recent Labs Lab 02/23/15 0751  AST 26  ALT 29  ALKPHOS 148*  BILITOT 0.4  PROT 6.2*  ALBUMIN 3.1*    CBC:  Recent Labs Lab 02/23/15 0751  WBC 6.8  HGB 13.8  HCT 43.1  MCV 95.1  PLT 283    Cardiac Enzymes: No results for input(s): CKTOTAL, CKMB, CKMBINDEX, TROPONINI in the last 168 hours.  BNP: No results for input(s): PROBNP in the last 8760 hours.  Coagulation: No results for input(s): INR in the last 168 hours.  ECG:   Medications:   Scheduled Medications: . atorvastatin  10 mg Oral q1800  . diltiazem  240 mg Oral Daily  . docusate sodium  100 mg Oral BID  . latanoprost  1 drop Both Eyes QHS  . metoprolol tartrate  37.5 mg Oral BID  . pantoprazole  40 mg Oral Daily     Infusions:     PRN Medications:  acetaminophen, albuterol, ondansetron **OR** ondansetron (ZOFRAN) IV, sorbitol     Assessment/Plan    1. Afib - rate controlled with  diltiazem and lopressor. There have been some issues with low heart rates and blood pressures, however from charting appears there is high concern that dinamap measurements for her are inaccurate - lopressor had been held recently for low blood pressures, yesterday noted to have some elevated afib rates. From Dr Bensimhon's note manual bp was within normal limits and lopressor restarted.  - vitals this AM normal bp and heart rate. Agree that any low documented blood pressures or heart rates should be confirmed with manual measurements prior to medication holding or change.  - no anticoag due to recent head bleed    Will sign off. Call us back if needed   Carlyle Dolly, M.D.

## 2015-02-26 NOTE — Progress Notes (Signed)
Subjective/Complaints: Pt without CP or SOB, appreciate cardiology note ROS- denies bladder or bowel issues   Objective: Vital Signs: Blood pressure 116/74, pulse 93, temperature 98.2 F (36.8 C), temperature source Oral, resp. rate 18, height _0  (1.676 m), weight 78.155 kg (172 lb 4.8 oz), last menstrual period 12/03/2002, SpO2 95 %. No results found. Results for orders placed or performed during the hospital encounter of 02/22/15 (from the past 72 hour(s))  CBC WITH DIFFERENTIAL     Status: None   Collection Time: 02/23/15  7:51 AM  Result Value Ref Range   WBC 6.8 4.0 - 10.5 K/uL   RBC 4.53 3.87 - 5.11 MIL/uL   Hemoglobin 13.8 12.0 - 15.0 g/dL   HCT 43.1 36.0 - 46.0 %   MCV 95.1 78.0 - 100.0 fL   MCH 30.5 26.0 - 34.0 pg   MCHC 32.0 30.0 - 36.0 g/dL   RDW 14.4 11.5 - 15.5 %   Platelets 283 150 - 400 K/uL   Neutrophils Relative % 53 43 - 77 %   Lymphocytes Relative 35 12 - 46 %   Monocytes Relative 7 3 - 12 %   Eosinophils Relative 4 0 - 5 %   Basophils Relative 1 0 - 1 %   Neutro Abs 3.5 1.7 - 7.7 K/uL   Lymphs Abs 2.4 0.7 - 4.0 K/uL   Monocytes Absolute 0.5 0.1 - 1.0 K/uL   Eosinophils Absolute 0.3 0.0 - 0.7 K/uL   Basophils Absolute 0.1 0.0 - 0.1 K/uL   WBC Morphology ATYPICAL LYMPHOCYTES    Smear Review LARGE PLATELETS PRESENT   Comprehensive metabolic panel     Status: Abnormal   Collection Time: 02/23/15  7:51 AM  Result Value Ref Range   Sodium 142 135 - 145 mmol/L   Potassium 4.2 3.5 - 5.1 mmol/L   Chloride 103 101 - 111 mmol/L   CO2 29 22 - 32 mmol/L   Glucose, Bld 100 (H) 65 - 99 mg/dL   BUN 10 6 - 20 mg/dL   Creatinine, Ser 0.57 0.44 - 1.00 mg/dL   Calcium 9.3 8.9 - 10.3 mg/dL   Total Protein 6.2 (L) 6.5 - 8.1 g/dL   Albumin 3.1 (L) 3.5 - 5.0 g/dL   AST 26 15 - 41 U/L   ALT 29 14 - 54 U/L   Alkaline Phosphatase 148 (H) 38 - 126 U/L   Total Bilirubin 0.4 0.3 - 1.2 mg/dL   GFR calc non Af Amer >60 >60 mL/min   GFR calc Af Amer >60 >60 mL/min   Comment: (NOTE) The eGFR has been calculated using the CKD EPI equation. This calculation has not been validated in all clinical situations. eGFR's persistently <60 mL/min signify possible Chronic Kidney Disease.    Anion gap 10 5 - 15     HEENT: nystagmus Cardio: irregular and tachy Resp: CTA B/L and unlabored GI: BS positive and non tender, non distended Extremity:  Pulses positive and No Edema Skin:   Intact Neuro: Confused and Abnormal FMC Ataxic Right FNF and Right Heel to Shin/ dec FMC, motor 4+ om Right side 5/5 on left Musc/Skel:  Normal Gen NAD   Assessment/Plan: 1. Functional deficits secondary to Right cerebellar ICH which require 3+ hours per day of interdisciplinary therapy in a comprehensive inpatient rehab setting. Physiatrist is providing close team supervision and 24 hour management of active medical problems listed below. Physiatrist and rehab team continue to assess barriers to discharge/monitor patient progress toward functional  and medical goals. FIM: FIM - Bathing Bathing Steps Patient Completed: Chest, Right Arm, Left Arm, Abdomen, Front perineal area, Buttocks, Right upper leg, Left upper leg Bathing: 4: Min-Patient completes 8-9 38f10 parts or 75+ percent  FIM - Upper Body Dressing/Undressing Upper body dressing/undressing steps patient completed: Thread/unthread left bra strap, Thread/unthread right sleeve of front closure shirt/dress, Thread/unthread left sleeve of front closure shirt/dress, Thread/unthread right bra strap, Button/unbutton shirt, Pull shirt around back of front closure shirt/dress, Hook/unhook bra Upper body dressing/undressing: 5: Supervision: Safety issues/verbal cues FIM - Lower Body Dressing/Undressing Lower body dressing/undressing steps patient completed: Thread/unthread right underwear leg, Thread/unthread left underwear leg, Pull underwear up/down, Thread/unthread right pants leg, Thread/unthread left pants leg, Pull pants up/down,  Don/Doff right shoe, Don/Doff left shoe, Don/Doff right sock Lower body dressing/undressing: 4: Min-Patient completed 75 plus % of tasks  FIM - Toileting Toileting steps completed by patient: Performs perineal hygiene Toileting Assistive Devices: Grab bar or rail for support Toileting: 2: Max-Patient completed 1 of 3 steps  FIM - TRadio producerDevices: Elevated toilet seat Toilet Transfers: 4-To toilet/BSC: Min A (steadying Pt. > 75%), 4-From toilet/BSC: Min A (steadying Pt. > 75%)  FIM - Bed/Chair Transfer Bed/Chair Transfer Assistive Devices: Arm rests Bed/Chair Transfer: 5: Supine > Sit: Supervision (verbal cues/safety issues), 5: Sit > Supine: Supervision (verbal cues/safety issues), 4: Bed > Chair or W/C: Min A (steadying Pt. > 75%), 4: Chair or W/C > Bed: Min A (steadying Pt. > 75%)  FIM - Locomotion: Wheelchair Distance: 40 Locomotion: Wheelchair: 1: Travels less than 50 ft with supervision, cueing or coaxing FIM - Locomotion: Ambulation Locomotion: Ambulation Assistive Devices: WAdministratorAmbulation/Gait Assistance: 4: Min assist Locomotion: Ambulation: 1: Travels less than 50 ft with minimal assistance (Pt.>75%)  Comprehension Comprehension Mode: Auditory Comprehension: 5-Follows basic conversation/direction: With extra time/assistive device  Expression Expression Mode: Verbal Expression: 4-Expresses basic 75 - 89% of the time/requires cueing 10 - 24% of the time. Needs helper to occlude trach/needs to repeat words.  Social Interaction Social Interaction: 5-Interacts appropriately 90% of the time - Needs monitoring or encouragement for participation or interaction.  Problem Solving Problem Solving: 4-Solves basic 75 - 89% of the time/requires cueing 10 - 24% of the time  Memory Memory: 5-Recognizes or recalls 90% of the time/requires cueing < 10% of the time  Medical Problem List and Plan: 1. Functional deficits secondary to right  cerebellar hemorrhage status post craniotomy evacuation of hematoma 02/05/2015 2. DVT Prophylaxis/Anticoagulation: SCDs. Monitor for any signs of DVT 3. Pain Management: Tylenol as needed 4. Persistent atrial fibrillation with RVR. Cardizem 240 mg daily, Lopressor 37.5 mg twice a day. Cardiac rate controlled. Follow-up cardiology services. Not a candidate for anticoagulation due to hemorrhage 5. Neuropsych: This patient is capable of making decisions on her own behalf. 6. Skin/Wound Care: Routine skin checks 7. Fluids/Electrolytes/Nutrition: Routine I&O with follow-up chemistries 8. Hyperlipidemia. Lipitor 9. Urinary retention. resolved 10.  Hx of Right breast carcinoma , s/p axillary dissection no BPs RIght arm LOS (Days) 4 A FACE TO FACE EVALUATION WAS PERFORMED  KIRSTEINS,ANDREW E 02/26/2015, 7:46 AM

## 2015-02-27 ENCOUNTER — Inpatient Hospital Stay (HOSPITAL_COMMUNITY): Payer: Medicare Other | Admitting: Physical Therapy

## 2015-02-27 ENCOUNTER — Inpatient Hospital Stay (HOSPITAL_COMMUNITY): Payer: Medicare Other | Admitting: Occupational Therapy

## 2015-02-27 MED ORDER — BISACODYL 10 MG RE SUPP
10.0000 mg | Freq: Every day | RECTAL | Status: DC | PRN
  Administered 2015-02-27 – 2015-02-28 (×2): 10 mg via RECTAL
  Filled 2015-02-27 (×2): qty 1

## 2015-02-27 NOTE — Progress Notes (Signed)
Nursing Note: BP -90/60 P-76.Pt refused her lopressor.Held per pt request.wbb

## 2015-02-27 NOTE — Progress Notes (Signed)
Occupational Therapy Session Note  Patient Details  Name: [name] MRN: [id number] Date of Birth: 06-Oct-1934  Today's Date: 02/27/2015 OT Individual Time:  -   [phone]  (60 min)      Short Term Goals: Week 1:  OT Short Term Goal 1 (Week 1): Pt will perform 3/3 toileting tasks with steady A  OT Short Term Goal 2 (Week 1): Pt will complete LB dressing with min assist at sit > stand level OT Short Term Goal 3 (Week 1): Pt will complete toilet transfers with supervision assist with grab bars OT Short Term Goal 4 (Week 1): Pt will complete walk-in shower transfers with min assist OT Short Term Goal 5 (Week 1): Pt will complete 2 grooming tasks in standing with steady assist for standing balance  Skilled Therapeutic Interventions/Progress Updates:    BP sitting in wc upon OT arrival   Two daughters present.  Propelled wc to tub bench. Pt transferred with grab bar to bench with min steadying assist.  Performed bathing with  Moderate assist for balance when standing.  Provided cues for positioning feet for best sit to stand method.  Transferred to wc and performed dressing at sink with education on hand placement around pelvic region for balance.   Pt asked for help from daughters for donning fgripper socks.  Left pt in wc with call bell,phone within reach.        Therapy Documentation Precautions:  Restrictions Weight Bearing Restrictions: No    Vital Signs: Therapy Vitals Pulse Rate: 88 BP: 102/64 mmHg (ok to give BP meds per MD Kirsteins ) Pain:  None reported             See FIM for current functional status  Therapy/Group: Individual Therapy  Lisa Roca 02/27/2015, 10:16 AM

## 2015-02-27 NOTE — Progress Notes (Signed)
Patient given sorbitol at 1546. Last bowel movement noted at 6/21. Approximately 5 minutes after given nurse called to the room because patient was throwing up some clear,slight brownish substance. Dr Letta Pate notified. New orders for rectal suppository. Suppository given at 1613. Patient denied any more nausea after first incident. Patient 1715-Patient required disimpaction able to have large amount of stool out. Stool semi soft/firm.

## 2015-02-27 NOTE — Progress Notes (Signed)
Subjective/Complaints: Pt without CP or SOB, appreciate cardiology note ROS- denies bladder or bowel issues   Objective: Vital Signs: Blood pressure 121/56, pulse 99, temperature 98.6 F (37 C), temperature source Oral, resp. rate 19, height _0  (1.676 m), weight 78.155 kg (172 lb 4.8 oz), last menstrual period 12/03/2002, SpO2 97 %. No results found. No results found for this or any previous visit (from the past 72 hour(s)).   HEENT: nystagmus Cardio: irregular and tachy Resp: CTA B/L and unlabored GI: BS positive and non tender, non distended Extremity:  Pulses positive and No Edema Skin:   Intact Neuro: Confused and Abnormal FMC Ataxic Right FNF and Right Heel to Shin/ dec FMC, motor 4+ om Right side 5/5 on left Musc/Skel:  Normal Gen NAD   Assessment/Plan: 1. Functional deficits secondary to Right cerebellar ICH which require 3+ hours per day of interdisciplinary therapy in a comprehensive inpatient rehab setting. Physiatrist is providing close team supervision and 24 hour management of active medical problems listed below. Physiatrist and rehab team continue to assess barriers to discharge/monitor patient progress toward functional and medical goals. FIM: FIM - Bathing Bathing Steps Patient Completed: Chest, Right Arm, Left Arm, Abdomen, Front perineal area, Buttocks, Right upper leg, Left upper leg (family assist) Bathing: 4: Min-Patient completes 8-9 36f10 parts or 75+ percent  FIM - Upper Body Dressing/Undressing Upper body dressing/undressing steps patient completed: Thread/unthread right bra strap, Thread/unthread left bra strap, Thread/unthread left sleeve of pullover shirt/dress, Thread/unthread right sleeve of front closure shirt/dress, Pull shirt around back of front closure shirt/dress, Hook/unhook bra Upper body dressing/undressing: 6: More than reasonable amount of time FIM - Lower Body Dressing/Undressing Lower body dressing/undressing steps patient completed:  Thread/unthread right underwear leg, Thread/unthread left underwear leg, Pull underwear up/down, Thread/unthread right pants leg, Thread/unthread left pants leg, Pull pants up/down (family assist) Lower body dressing/undressing: 6: More than reasonable amount of time  FIM - Toileting Toileting steps completed by patient: Performs perineal hygiene Toileting Assistive Devices: Grab bar or rail for support Toileting: 2: Max-Patient completed 1 of 3 steps  FIM - TRadio producerDevices: Elevated toilet seat Toilet Transfers: 4-To toilet/BSC: Min A (steadying Pt. > 75%), 4-From toilet/BSC: Min A (steadying Pt. > 75%)  FIM - Bed/Chair Transfer Bed/Chair Transfer Assistive Devices: Arm rests Bed/Chair Transfer: 5: Sit > Supine: Supervision (verbal cues/safety issues), 5: Supine > Sit: Supervision (verbal cues/safety issues), 4: Bed > Chair or W/C: Min A (steadying Pt. > 75%), 3: Chair or W/C > Bed: Mod A (lift or lower assist)  FIM - Locomotion: Wheelchair Distance: 160 Locomotion: Wheelchair: 5: Travels 150 ft or more: maneuvers on rugs and over door sills with supervision, cueing or coaxing FIM - Locomotion: Ambulation Locomotion: Ambulation Assistive Devices: WAdministratorAmbulation/Gait Assistance: 3: Mod assist Locomotion: Ambulation: 2: Travels 50 - 149 ft with moderate assistance (Pt: 50 - 74%)  Comprehension Comprehension Mode: Auditory Comprehension: 5-Follows basic conversation/direction: With no assist  Expression Expression Mode: Verbal Expression: 5-Expresses complex 90% of the time/cues < 10% of the time  Social Interaction Social Interaction: 5-Interacts appropriately 90% of the time - Needs monitoring or encouragement for participation or interaction.  Problem Solving Problem Solving: 5-Solves complex 90% of the time/cues < 10% of the time  Memory Memory: 5-Recognizes or recalls 90% of the time/requires cueing < 10% of the  time  Medical Problem List and Plan: 1. Functional deficits secondary to right cerebellar hemorrhage status post craniotomy evacuation of hematoma  02/05/2015 2. DVT Prophylaxis/Anticoagulation: SCDs. Monitor for any signs of DVT 3. Pain Management: Tylenol as needed 4. Persistent atrial fibrillation with RVR. Cardizem 240 mg daily, Lopressor 37.5 mg twice a day. Cardiac rate controlled. Follow-up cardiology services. Not a candidate for anticoagulation due to hemorrhage, occ variability, rec is to confirm with manual cuff for BP If BP run too low may need to reduce Cardizem and increase BB 5. Neuropsych: This patient is capable of making decisions on her own behalf. 6. Skin/Wound Care: Routine skin checks 7. Fluids/Electrolytes/Nutrition: Routine I&O with follow-up chemistries 8. Hyperlipidemia. Lipitor 9. Urinary retention. resolved 10.  Hx of Right breast carcinoma , s/p axillary dissection no BPs RIght arm LOS (Days) 5 A FACE TO FACE EVALUATION WAS PERFORMED  KIRSTEINS,ANDREW E 02/27/2015, 7:41 AM

## 2015-02-27 NOTE — Progress Notes (Signed)
Physical Therapy Session Note  Patient Details  Name: [name] MRN: [id number] Date of Birth: May 13, 1935  Today's Date: 02/27/2015 PT Individual Time: 1415-1445 PT Individual Time Calculation (min): 30 min   Short Term Goals: Week 1:  PT Short Term Goal 1 (Week 1): Pt to perform bed mobility with supevision PT Short Term Goal 1 - Progress (Week 1): Met PT Short Term Goal 2 (Week 1): Pt will perform bed <> chair transfers with consistent Min A PT Short Term Goal 3 (Week 1): Pt to demonstrate w/c propulsion 100 ft supervision PT Short Term Goal 4 (Week 1): Pt to ambulate 75 ft with LRAD and modA +1 PT Short Term Goal 5 (Week 1): Pt to perform ascent/descent of 6 3-inch stairs with modA +1  Skilled Therapeutic Interventions/Progress Updates:  Pt was seen bedside in the pm. Pt transferred supine to edge of bed with side rail, head of bed elevated and S. Pt transferred edge of bed to w/c with min A, requires several attempts and multiple verbal cues. Pt propelled w/c about 100 feet with B LEs and S. In gym, treatment focused on NMR, performed step ups, 3 sets x 10 reps each. Performed sit to stand transfers, 3 sets x 5 reps each. Pt ambulated about 50 feet with rolling walker and min to mod A, pt had one LOB requiring mod A to correct. Pt returned to room and left sitting up with family at bedside.   Therapy Documentation Precautions:  Restrictions Weight Bearing Restrictions: No General:   Pain: Pt c/o intermittent pain posterior head throughout treatment.    Locomotion : Ambulation Ambulation/Gait Assistance: 4: Min assist;3: Mod assist   See FIM for current functional status  Therapy/Group: Individual Therapy  Dub Amis 02/27/2015, 3:56 PM

## 2015-02-28 ENCOUNTER — Inpatient Hospital Stay (HOSPITAL_COMMUNITY): Payer: Medicare Other | Admitting: Speech Pathology

## 2015-02-28 ENCOUNTER — Inpatient Hospital Stay (HOSPITAL_COMMUNITY): Payer: Medicare Other | Admitting: Occupational Therapy

## 2015-02-28 ENCOUNTER — Inpatient Hospital Stay (HOSPITAL_COMMUNITY): Payer: Medicare Other | Admitting: Rehabilitation

## 2015-02-28 ENCOUNTER — Inpatient Hospital Stay (HOSPITAL_COMMUNITY): Payer: Medicare Other | Admitting: Physical Therapy

## 2015-02-28 LAB — BASIC METABOLIC PANEL
Anion gap: 5 (ref 5–15)
BUN: 10 mg/dL (ref 6–20)
CO2: 31 mmol/L (ref 22–32)
Calcium: 9.3 mg/dL (ref 8.9–10.3)
Chloride: 103 mmol/L (ref 101–111)
Creatinine, Ser: 0.75 mg/dL (ref 0.44–1.00)
GFR calc Af Amer: >60 mL/min (ref >60)
Glucose, Bld: 99 mg/dL (ref 65–99)
POTASSIUM: 4.4 mmol/L (ref 3.5–5.1)
Sodium: 139 mmol/L (ref 135–145)

## 2015-02-28 MED ORDER — SENNOSIDES-DOCUSATE SODIUM 8.6-50 MG PO TABS: 1.0000 | ORAL_TABLET | Freq: Every day | ORAL | Status: DC

## 2015-02-28 MED ORDER — SENNOSIDES-DOCUSATE SODIUM 8.6-50 MG PO TABS: 2.0000 | ORAL_TABLET | Freq: Every day | ORAL | Status: DC

## 2015-02-28 MED ORDER — DOCUSATE SODIUM 100 MG PO CAPS: 100.0000 mg | ORAL_CAPSULE | Freq: Two times a day (BID) | ORAL | Status: DC

## 2015-02-28 MED ORDER — ACETAMINOPHEN 325 MG PO TABS: 650.0000 mg | ORAL_TABLET | ORAL | Status: DC | PRN

## 2015-02-28 MED ORDER — SENNOSIDES-DOCUSATE SODIUM 8.6-50 MG PO TABS
2.0000 | ORAL_TABLET | Freq: Every day | ORAL | Status: DC
  Filled 2015-02-28: qty 2

## 2015-02-28 MED ORDER — DILTIAZEM HCL ER COATED BEADS 240 MG PO CP24: 240.0000 mg | ORAL_CAPSULE | Freq: Every day | ORAL | Status: DC

## 2015-02-28 MED ORDER — ATORVASTATIN CALCIUM 10 MG PO TABS: 10.0000 mg | ORAL_TABLET | Freq: Every day | ORAL | Status: DC

## 2015-02-28 MED ORDER — PANTOPRAZOLE SODIUM 40 MG PO TBEC: 40.0000 mg | DELAYED_RELEASE_TABLET | Freq: Every day | ORAL | Status: DC

## 2015-02-28 MED ORDER — SENNOSIDES-DOCUSATE SODIUM 8.6-50 MG PO TABS
2.0000 | ORAL_TABLET | Freq: Once | ORAL | Status: AC
  Administered 2015-02-28: 2 via ORAL
  Filled 2015-02-28: qty 2

## 2015-02-28 MED ORDER — METOPROLOL TARTRATE 37.5 MG PO TABS: 37.5000 mg | ORAL_TABLET | Freq: Two times a day (BID) | ORAL | Status: DC

## 2015-02-28 NOTE — Discharge Summary (Signed)
Discharge summary job # [id number]

## 2015-02-28 NOTE — Progress Notes (Signed)
Occupational Therapy Session Note  Patient Details  Name: [name] MRN: [id number] Date of Birth: 1935/06/09  Today's Date: 02/28/2015 OT Individual Time: 1000-1053 OT Individual Time Calculation (min): 53 min  Missed 7 minutes of skilled therapy secondary to toileting needs.   Short Term Goals: Week 1:  OT Short Term Goal 1 (Week 1): Pt will perform 3/3 toileting tasks with steady A  OT Short Term Goal 2 (Week 1): Pt will complete LB dressing with min assist at sit > stand level OT Short Term Goal 3 (Week 1): Pt will complete toilet transfers with supervision assist with grab bars OT Short Term Goal 4 (Week 1): Pt will complete walk-in shower transfers with min assist OT Short Term Goal 5 (Week 1): Pt will complete 2 grooming tasks in standing with steady assist for standing balance  Skilled Therapeutic Interventions/Progress Updates:  Patient received seated in w/c with daughter present in room. Patient eager to shower this am. Daughter assisted with propelling patient into BR. Pt transferred w/c> tub bench transfer in walk-in shower stall. Pt then completed UB/LB bathing in sit<>stand position using grab bars. Pt a bit impulsive and required cueing to slow down for safety and effectiveness of tasks. Pt transferred back to w/c after shower and performed UB/LB dressing in sit<>stand position at sink. Pt able to reach BLEs to thread feet into pants. After dressing, daughter assisted patient to the therapy gym. Pt transferred onto therapy mat with min assist, impulsively performing squat pivot transfer. Discussed importance of letting caregiver know when she was transferring and importance of skin integrity. Pt stood for therapeutic activity focusing on dynamic standing balance/tolerance/endurance, functional use/strengthening to RUE, visual tracking to right side. Pt with urgency to use bathroom. Pt transferred back to w/c and daughter assisted her back to the room. Pt transferred w/c>  elevated toilet seat with daughters assistance. Daughter is independent to assist patient prn for toilet transfer. Left patient seated on toilet with daughter present. Secondary to toileting needs, pt missed 7 minutes of skilled OT.   Therapy Documentation Precautions:  Restrictions Weight Bearing Restrictions: No   General: General OT Amount of Missed Time: 7 Minutes  See FIM for current functional status  Therapy/Group: Individual Therapy  [name] , MS, OTR/L, CLT Pager: [phone]  02/28/2015, 11:17 AM

## 2015-02-28 NOTE — Progress Notes (Signed)
Physical Therapy Session Note  Patient Details  Name: [name] MRN: [id number] Date of Birth: May 26, 1935  Today's Date: 02/28/2015 PT Individual Time: 0900-0957 (86 min)     Short Term Goals: Week 1:  PT Short Term Goal 1 (Week 1): Pt to perform bed mobility with supevision PT Short Term Goal 1 - Progress (Week 1): Met PT Short Term Goal 2 (Week 1): Pt will perform bed <> chair transfers with consistent Min A PT Short Term Goal 3 (Week 1): Pt to demonstrate w/c propulsion 100 ft supervision PT Short Term Goal 4 (Week 1): Pt to ambulate 75 ft with LRAD and modA +1 PT Short Term Goal 5 (Week 1): Pt to perform ascent/descent of 6 3-inch stairs with modA +1  Skilled Therapeutic Interventions/Progress Updates:    Pt received in w/c with daughter present; no c/o pain and agreeable to treatment. Pt instructed in UE/LE dressing with mod/maxA due to time constraints and likelihood of B&D session with OT immediately following PT tx. Instructed pt in performance of 1 step ascent/descent with RW; performed x3 trials with daughter assisting and cueing pt appropriately. No LOB and pt demonstrates safety with management of RW on step. Performed car transfer x3 trials; first trial with PT demonstrating appropriate guarding techniques and next two trials performed with daughter guarding pt safely and appropriately cueing for pt hand placement. Pt instructed in gait x90' for 2 trials with minA/CGA and RW. One minor LOB while turning around left corner; able to recover with minA from therapist. Pt reports excessive fatigue after completion of gait; requests to return to room. In room pt instructed in vestibular x1 exercises. Instructed pt to demonstrate how she had been performing exercise; pt unable to correctly demonstrate, and required max cues for head turns and eye stabilization on target. Pt instructed in faster head turns due to slow performance; able to return demonstration correctly. Pt remained  seated in w/c at completion of session with daughter present and all needs within reach.   Therapy Documentation Precautions:  Restrictions Weight Bearing Restrictions: No Pain: Pain Assessment Pain Assessment: No/denies pain Pain Score: 0-No pain   See FIM for current functional status  Therapy/Group: Individual Therapy  Luberta Mutter 02/28/2015, 3:41 PM

## 2015-02-28 NOTE — Progress Notes (Signed)
Social Work Patient ID: [name], female   DOB: December 28, 1934, 80 y.o.   MRN: [id number] Met with pt and daughter's who feel they can manage Mom at home, they have ben here and participated in all of her therapies for days. Pt feels she will recover better at home and be in her own surroundings. Checked with MD he reports she is medically stable for discharge tomorrow. Discussed with team and they have completed family education. Pt wants to start off with home health then transition to OP. Work on discharge home tomorrow.

## 2015-02-28 NOTE — Patient Care Conference (Signed)
Inpatient RehabilitationTeam Conference and Plan of Care Update Date: 03/01/2015   Time: 10;00 AM    Patient Name: [name]      Medical Record Number: [id number]  Date of Birth: [birth year] Sex: Female         Room/Bed: 4M03C/4M03C-01 Payor Info: Payor: MEDICARE / Plan: MEDICARE PART A AND B / Product Type: *No Product type* /    Admitting Diagnosis: R BRAIN HEMORR   Admit Date/Time:  02/22/2015  4:38 PM Admission Comments: No comment available   Primary Diagnosis:  Cerebellar hemorrhage, nontraumatic Principal Problem: Cerebellar hemorrhage, nontraumatic  Patient Active Problem List   Diagnosis Date Noted  . Possible Bradycardia 02/22/2015  . Cerebellar hemorrhage, nontraumatic 02/22/2015  . Benign essential HTN 02/21/2015  . Atrial fibrillation 02/20/2015  . 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 Dizziness   . Cytotoxic cerebral edema   . Acute cerebellar hemorrhage   . Hypokalemia   . Chronic combined systolic and diastolic congestive heart failure   . Atrial fibrillation with RVR   . HLD (hyperlipidemia)   . Obstructive hydrocephalus   . Cerebellar hemorrhage 02/05/2015  . ICH (intracerebral hemorrhage) 02/05/2015  . COPD bronchitis 12/30/2014  . GERD (gastroesophageal reflux disease) 12/30/2014  . Bladder cancer 01/01/2014  . CHF (congestive heart failure) 01/01/2014  . Glaucoma 01/01/2014  . DCIS (ductal carcinoma in situ) of breast 01/01/2014    Expected Discharge Date: Expected Discharge Date: 03/01/15  Team Members Present: Physician leading conference: Dr. Alysia Penna Social Worker Present: Ovidio Kin, LCSW Nurse Present: Heather Roberts, RN PT Present: Other (comment) Rudene Christians Tygielski-PT) OT Present: Simonne Come, OT SLP Present: Gunnar Fusi, SLP PPS Coordinator present : Daiva Nakayama, RN, CRRN     Current Status/Progress Goal Weekly Team Focus  Medical     medicine regime adjusted and doing well   medically stable      Bowel/Bladder     cont B & B   cont B & B     Swallow/Nutrition/ Hydration     na        ADL's     min B & D-mod LE dressing.    supervision to min   balance, exercises, ADL's  Mobility   minA transfers, gait with RW, stairs  supervision overall, minA stairs  standing balance, gait with RW, vestibular exercises   Communication     na        Safety/Cognition/ Behavioral Observations    no unsafe behaviors        Pain     less than 3, monitor   pain free     Skin     no issues           *See Care Plan and progress notes for long and short-term goals.  Barriers to Discharge:   medical stability   Possible Resolutions to Barriers:    no barriers   Discharge Planning/Teaching Needs:    Home with husband and daughter's to continue to assist her and will hire assist if necessary     Team Discussion:  Pt is progressing well and reaching her goals-supervision/min level. Medically stable now have adjusted her meds and medically stable for DC today.  Revisions to Treatment Plan:  DC today      Elease Hashimoto 03/01/2015, 8:42 AM

## 2015-02-28 NOTE — Progress Notes (Signed)
CC:  I want to go home,my daughter feels I would be better off,  is it still the 29th?" Subjective/Complaints: Pt without CP, SOB or palpitations  .ROS- denies bladder or bowel issues   Objective: Vital Signs: Blood pressure 119/98, pulse 88, temperature 98.2 F (36.8 C), temperature source Oral, resp. rate 18, height _0  (1.676 m), weight 78.155 kg (172 lb 4.8 oz), last menstrual period 12/03/2002, SpO2 90 %. No results found. Results for orders placed or performed during the hospital encounter of 02/22/15 (from the past 72 hour(s))  Basic metabolic panel     Status: None   Collection Time: 02/28/15  4:55 AM  Result Value Ref Range   Sodium 139 135 - 145 mmol/L   Potassium 4.4 3.5 - 5.1 mmol/L   Chloride 103 101 - 111 mmol/L   CO2 31 22 - 32 mmol/L   Glucose, Bld 99 65 - 99 mg/dL   BUN 10 6 - 20 mg/dL   Creatinine, Ser 0.75 0.44 - 1.00 mg/dL   Calcium 9.3 8.9 - 10.3 mg/dL   GFR calc non Af Amer >60 >60 mL/min   GFR calc Af Amer >60 >60 mL/min    Comment: (NOTE) The eGFR has been calculated using the CKD EPI equation. This calculation has not been validated in all clinical situations. eGFR's persistently <60 mL/min signify possible Chronic Kidney Disease.    Anion gap 5 5 - 15     HEENT: nystagmus Cardio: irregular and tachy Resp: CTA B/L and unlabored GI: BS positive and non tender, non distended Extremity:  Pulses positive and No Edema Skin:   Intact Neuro: Confused and Abnormal FMC Ataxic Right FNF and Right Heel to Shin/ dec FMC, motor 4+ om Right side 5/5 on left Musc/Skel:  Normal Gen NAD   Assessment/Plan: 1. Functional deficits secondary to Right cerebellar ICH which require 3+ hours per day of interdisciplinary therapy in a comprehensive inpatient rehab setting. Physiatrist is providing close team supervision and 24 hour management of active medical problems listed below. Physiatrist and rehab team continue to assess barriers to discharge/monitor patient  progress toward functional and medical goals. Pt at [place] A transfers and SUp WC mobilityu HR and BP have stablized, 6/29 D/C Appears reasonable from medical standpoint FIM: FIM - Bathing Bathing Steps Patient Completed: Chest, Right Arm, Left Arm, Abdomen, Front perineal area, Buttocks, Right upper leg, Left upper leg Bathing: 4: Min-Patient completes 8-9 [id number] parts or 75+ percent  FIM - Upper Body Dressing/Undressing Upper body dressing/undressing steps patient completed: Thread/unthread right bra strap, Thread/unthread left bra strap, Thread/unthread left sleeve of pullover shirt/dress, Hook/unhook bra, Thread/unthread right sleeve of pullover shirt/dresss, Put head through opening of pull over shirt/dress, Pull shirt over trunk Upper body dressing/undressing: 5: Supervision: Safety issues/verbal cues FIM - Lower Body Dressing/Undressing Lower body dressing/undressing steps patient completed: Thread/unthread right underwear leg, Thread/unthread left underwear leg, Pull underwear up/down, Thread/unthread right pants leg, Thread/unthread left pants leg, Pull pants up/down Lower body dressing/undressing: 5: Supervision: Safety issues/verbal cues  FIM - Toileting Toileting steps completed by patient: Performs perineal hygiene Toileting Assistive Devices: Grab bar or rail for support Toileting: 2: Max-Patient completed 1 of 3 steps  FIM - TRadio producerDevices: Elevated toilet seat Toilet Transfers: 4-To toilet/BSC: Min A (steadying Pt. > 75%), 4-From toilet/BSC: Min A (steadying Pt. > 75%)  FIM - Bed/Chair Transfer Bed/Chair Transfer Assistive Devices: Arm rests, HOB elevated, Bed rails Bed/Chair Transfer: 5: Supine > Sit: Supervision (verbal  cues/safety issues), 4: Bed > Chair or W/C: Min A (steadying Pt. > 75%)  FIM - Locomotion: Wheelchair Distance: 160 Locomotion: Wheelchair: 2: Travels 73 - 149 ft with supervision, cueing or coaxing FIM - Locomotion:  Ambulation Locomotion: Ambulation Assistive Devices: Administrator Ambulation/Gait Assistance: 4: Min assist, 3: Mod assist Locomotion: Ambulation: 2: Travels 50 - 149 ft with moderate assistance (Pt: 50 - 74%)  Comprehension Comprehension Mode: Auditory Comprehension: 5-Follows basic conversation/direction: With no assist  Expression Expression Mode: Verbal Expression: 5-Expresses basic needs/ideas: With no assist  Social Interaction Social Interaction: 5-Interacts appropriately 90% of the time - Needs monitoring or encouragement for participation or interaction.  Problem Solving Problem Solving: 5-Solves complex 90% of the time/cues < 10% of the time  Memory Memory: 5-Recognizes or recalls 90% of the time/requires cueing < 10% of the time  Medical Problem List and Plan: 1. Functional deficits secondary to right cerebellar hemorrhage status post craniotomy evacuation of hematoma 02/05/2015 2. DVT Prophylaxis/Anticoagulation: SCDs. Monitor for any signs of DVT 3. Pain Management: Tylenol as needed 4. Persistent atrial fibrillation with RVR. Cardizem 240 mg daily, Lopressor 37.5 mg twice a day. Cardiac rate controlled. Follow-up cardiology services. Not a candidate for anticoagulation due to hemorrhage, occ variability, rec is to confirm with manual cuff for BP If BP run too low may need to reduce Cardizem and increase BB 5. Neuropsych: This patient is capable of making decisions on her own behalf. 6. Skin/Wound Care: Routine skin checks 7. Fluids/Electrolytes/Nutrition: Routine I&O , repeat BMET normal on 6/27 8. Hyperlipidemia. Lipitor 9. Urinary retention. resolved 10.  Hx of Right breast carcinoma , s/p axillary dissection no BPs RIght arm LOS (Days) 6 A FACE TO FACE EVALUATION WAS PERFORMED  KIRSTEINS,ANDREW E 02/28/2015, 6:48 AM

## 2015-02-28 NOTE — Progress Notes (Signed)
Physical Therapy Session Note  Patient Details  Name: [name] MRN: [id number] Date of Birth: 1935/07/01  Today's Date: 02/28/2015 PT Individual Time: 1545-1615 PT Individual Time Calculation (min): 30 min   Short Term Goals: Week 1:  PT Short Term Goal 1 (Week 1): Pt to perform bed mobility with supevision PT Short Term Goal 1 - Progress (Week 1): Met PT Short Term Goal 2 (Week 1): Pt will perform bed <> chair transfers with consistent Min A PT Short Term Goal 3 (Week 1): Pt to demonstrate w/c propulsion 100 ft supervision PT Short Term Goal 4 (Week 1): Pt to ambulate 75 ft with LRAD and modA +1 PT Short Term Goal 5 (Week 1): Pt to perform ascent/descent of 6 3-inch stairs with modA +1  Skilled Therapeutic Interventions/Progress Updates:   Pt received lying in bed, one daughter present during session.  Discussed with pt and daughter performing hands on training with gait and stairs to ensure safe D/C home tomorrow.  Educated daughter to assist posteriorly and to pts R due to pt having continuous LOB to the R.  Pt able to ambulate x 13' to ADL apt with daughter at min A level with cues for daughter to cue pt on posture and safety with RW as well as cues for hands on contact with pt at hips.  Once in apt, had daughter ambulate with pt to bed for increased challenge of carpet and tight spaces.  Performed bed mobility at S level.  Ambulated back to w/c and was assisted to therapy gym to perform stairs.  Set up 2 steps as pt will have at home (x 2) and educated to have pt ambulate to steps, set RW at top and perform stairs with HHA up/down with cues on hand placement and body mechanics to daughter (other daughter in during this time and assisted as well).  Third daughter present at end of session and states she did not feel she needed to practice any more, despite recommending that she perform.  Assisted back to room and assessed strength, sensation, etc, see D/C summary.  Daughters to assist  in restroom.    Therapy Documentation Precautions:  Restrictions Weight Bearing Restrictions: No   Pain: Pt with no c/o pain, but of nausea during session.    Locomotion : Ambulation Ambulation/Gait Assistance: 4: Min assist  See FIM for current functional status  Therapy/Group: Individual Therapy  Denice Bors 02/28/2015, 12:50 PM

## 2015-02-28 NOTE — Progress Notes (Signed)
Speech Language Pathology Discharge Summary  Patient Details  Name: [name] MRN: [id number] Date of Birth: [birth year]  Today's Date: 02/28/2015 SLP Individual Time: 1300-1400 SLP Individual Time Calculation (min): 60 min   Skilled Therapeutic Interventions:  Pt was seen for skilled ST targeting completion of family education and grad day activities prior to discharge.  Upon arrival, pt was reclined in bed, awake, with family present at bedside, and agreeable to participate in [place].  Pt was transferred to wheelchair by daughter in order to maximize attention and alertness during cognitive tasks.  SLP facilitated the session with a semi-complex home management task targeting functional problem solving and anticipatory awareness of deficits.  While pt required overall min assist to select an appropriate insurance plan to match a list of 5 targeted criteria, she was able to identify 6 areas for which she herself would want insurance coverage given her recent medical history and current level of function and selected an appropriate insurance plan for herself with supervision.  Pt's daughters remained throughout therapy session and SLP reviewed and reinforced recommendation that pt have assistance for medication and financial management.  SLP also advised pt and pt's family to advocate for transition to outpatient therapies once home health goals have been met given pt's previous level of function and desire to return to community involvement.  Pt's daughters verbalized understanding of all education.  All questions were answered to their satisfaction at this time.        Patient has met 2 of 2 long term goals.  Patient to discharge at overall Supervision level.  Reasons goals not met: n/a   Clinical Impression/Discharge Summary:  Pt has made functional gains while inpatient and is discharging having met 2 out of 2 long term goals.  Pt is currently supervision for semi-complex cognitive tasks due  to decreased functional problem solving and decreased anticipatory awareness of deficits.  Pt is discharging home with 24/7 supervision from family and home health ST follow up.  All family education is complete at this time.    Care Partner:  Caregiver Able to Provide Assistance: Yes  Type of Caregiver Assistance: Physical;Cognitive  Recommendation:  Home Health SLP;24 hour supervision/assistance  Rationale for SLP Follow Up: Maximize cognitive function and independence;Reduce caregiver burden   Equipment: none recommended by SLP    Reasons for discharge: Discharged from hospital   Patient/Family Agrees with Progress Made and Goals Achieved: Yes   See FIM for current functional status  Emilio Math 02/28/2015, 4:26 PM

## 2015-03-01 NOTE — Discharge Summary (Signed)
NAME:  [name], [name] NO.:  [id number]  MEDICAL RECORD NO.:  [id number]  LOCATION:  4M03C                        FACILITY:  [facility]  PHYSICIAN:  [name], M.D.DATE OF BIRTH:  [birth year]  DATE OF ADMISSION:  02/22/2015 DATE OF DISCHARGE:  03/01/2015                              DISCHARGE SUMMARY   DISCHARGE DIAGNOSES: 1. 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 2. SCDs for deep venous thrombosis prophylaxis. 3. Persistent atrial fibrillation with rapid ventricular response. 4. Hyperlipidemia. 5. Urinary retention, resolved. 6. History of right breast cancer.  HISTORY OF PRESENT ILLNESS:  This is a 80 year old right-handed female with history of hypertension; transient atrial fibrillation, maintained on aspirin; diastolic congestive heart failure, who lives with her husband, independent prior to admission.  Presented on February 05, 2015, with dizziness, nausea, vomiting as well as bouts of blurred vision. Denies any recent fall or trauma.  CT of the head showed a 4-cm acute hemorrhage that was in the right cerebellar hemisphere with surrounding edema and mass effect on the fourth ventricle as well as lacunar infarct of the right caudate head, appeared to be remote.  Underwent right suboccipital craniotomy, evacuation of hematoma on February 05, 2015, per Dr. Kathyrn Sheriff.  Hospital course, blood pressure management.  Followup cranial CT scan showed right cerebellar hematoma, remains spanning over a 4.1 x 3.5 cm versus 4.4 x 3.5 cm surrounding vasogenic edema. Followup scan on February 08, 2015, with small amount of mild hydrocephalus, unchanged, placed on 3% saline to decrease any cerebral edema and later discontinued.  Cardiology consulted for noted history of transient atrial fibrillation, maintained on aspirin prior to admission with RVR, placed on Cardizem, rate monitored.  She was not a  candidate for systemic anticoagulation given her hemorrhage.  Echocardiogram with ejection fraction of 37%, grade 1 diastolic dysfunction.  Maintained on a regular diet.  She was admitted for comprehensive rehab program on February 14, 2015, progressed nicely, requiring moderate assist for transfers.  Noted February 20, 2015, was variations and heart rate.  Follow up with rapid response, heart rate varying.  She was discharged to [facility], to be monitored.  Her Cardizem was adjusted to 240 mg daily as well as continuing Lopressor.  She was readmitted for comprehensive rehab program.  PAST MEDICAL HISTORY:  See discharge diagnoses.  SOCIAL HISTORY:  Independent prior to admission, living with family.  FUNCTIONAL STATUS:  Upon admission to [facility], moderate assist for basic mobility and transfers, mod-to-max assist activities of daily living.  PHYSICAL EXAMINATION:  VITAL SIGNS:  Blood pressure 126/68, pulse 96, temperature 98, respirations 18. GENERAL:  This was an alert female, craniotomy site clean and dry.  Mood was flat, but appropriate.  She could provide her name, age, date of birth, fair awareness of deficits. LUNGS:  Clear to auscultation. CARDIAC:  Regular rate and rhythm. ABDOMEN:  Soft, nontender.  Good bowel sounds.  REHABILITATION HOSPITAL COURSE:  The patient was admitted to Inpatient Rehab Services with therapies initiated on a 3-hour daily basis consisting of physical therapy, occupational therapy, speech therapy, and rehabilitation nursing.  The following issues were addressed during  the patient's rehabilitation stay.  Pertaining to [name]'s right cerebellar hemorrhage, she had undergone craniotomy, evacuation of hematoma.  She would follow up Neurosurgery.  No anticoagulation due to hemorrhage.  SCDs for DVT prophylaxis.  Followed by Cardiology Services for persistent atrial fibrillation.  Cardizem 240 mg daily, Lopressor adjusted to 37.5 mg twice  daily.  No chest pain or shortness of breath. She did have some initial urinary retention, resolved with increased mobility.  The patient received weekly collaborative interdisciplinary team conferences to discuss estimated length of stay, family teaching, and any barriers to discharge.  She is ambulating 90 feet x2, minimal assist contact guard, rolling walker, close monitoring for any loss of balance.  Some rest breaks due to fatigue.  Activities of daily living. Ongoing family education.  She transferred wheelchair to tub bench, transfers walk-in shower stall, completed upper lower body bathing, sit- to-stand position using grab bars.  She was a bit impulsive with recommendations of 24-hour supervision for her safety.  Full family teaching was completed.  Plan was discharge to home with ongoing therapies.  DISCHARGE MEDICATIONS:  Included: 1. Lipitor 10 mg daily. 2. Cardizem 240 mg p.o. daily. 3. Xalatan ophthalmic solution 1 drop both eyes at bedtime. 4. Metoprolol 37.5 mg p.o. b.i.d. 5. Protonix 40 mg p.o. daily. 6. Senokot two tablets at bedtime.  DIET:  Regular.  FOLLOWUP:  She would follow up with Dr. Alysia Penna at the [facility] as directed; Dr. [name], medical management; Dr. Kathyrn Sheriff, Neurosurgery, call for appointment; Dr. Quay Burow, Cardiology Services, call for appointment; Dr. [name], March 08, 2015.     Lauraine Rinne, P.A.   ______________________________ [name], M.D.    DA/MEDQ  D:  02/28/2015  T:  03/01/2015  Job:  [id number]  cc:   Quay Burow, M.D. [name], MD Dr. Kathyrn Sheriff

## 2015-03-01 NOTE — Progress Notes (Signed)
Social Work Discharge Note Discharge Note  The overall goal for the admission was met for:   Discharge location: [place] [facility]-24 HR CARE  Length of Stay: Yes-7 DAYS  Discharge activity level: Yes-SUPERVISION/MIN LEVEL  Home/community participation: Yes  Services provided included: MD, RD, PT, OT, SLP, RN, CM, TR, Pharmacy and SW  Financial Services: Medicare and Private Insurance: [employer]  Follow-up services arranged: Home Health: [facility]-PT,OT,SP, DME: [facility]-WHEELCHAIR and Patient/Family has no preference for HH/DME agencies  Comments (or additional information):FAMILY HERE DAILY AND IS WELL AWARE OF PT'S CARE NEEDS. WILL PROVIDE 24 HR CARE  Patient/Family verbalized understanding of follow-up arrangements: Yes  Individual responsible for coordination of the follow-up plan: [name]-DAUGHTER & JAMES-HUSBAND  Confirmed correct DME delivered: Elease Hashimoto 03/01/2015    Elease Hashimoto

## 2015-03-01 NOTE — Discharge Instructions (Signed)
Inpatient Rehab Discharge Instructions  [name] Discharge date and time: 03/01/15   Activities/Precautions/ Functional Status: Activity: activity as tolerated Diet: cardiac diet Wound Care: none needed Functional status:  ___ No restrictions     ___ Walk up steps independently _X__ 24/7 supervision/assistance   ___ Walk up steps with assistance ___ Intermittent supervision/assistance  ___ Bathe/dress independently ___ Walk with walker     _X__ Bathe/dress with assistance ___ Walk Independently    ___ Shower independently _X__ Walk with assistance    ___ Shower with assistance _X__ No alcohol     ___ Return to work/school ________  Special Instructions:   COMMUNITY REFERRALS UPON DISCHARGE:    Home Health:   PT, OT, [facility]   Date of last service:03/01/2015  Medical Equipment/Items Ordered:WHEELCHAIR  Agency/Supplier:[facility]   [phone]   GENERAL COMMUNITY RESOURCES FOR PATIENT/FAMILY: Support Groups:[facility] SUPPORT GROUP  STROKE/TIA DISCHARGE INSTRUCTIONS SMOKING Cigarette smoking nearly doubles your risk of having a stroke & is the single most alterable risk factor  If you smoke or have smoked in the last 12 months, you are advised to quit smoking for your health.  Most of the excess cardiovascular risk related to smoking disappears within a year of stopping.  Ask you doctor about anti-smoking medications  [place] Quit Line: 1-800-QUIT NOW  Free Smoking Cessation Classes [phone]  CHOLESTEROL Know your levels; limit fat & cholesterol in your diet  Lipid Panel     Component Value Date/Time   CHOL 175 02/06/2015 0933   TRIG 117 02/06/2015 0933   HDL 51 02/06/2015 0933   CHOLHDL 3.4 02/06/2015 0933   VLDL 23 02/06/2015 0933   LDLCALC 101* 02/06/2015 0933      Many patients benefit from treatment even if their cholesterol is at goal.  Goal: Total Cholesterol (CHOL) less than 160  Goal:   Triglycerides (TRIG) less than 150  Goal:  HDL greater than 40  Goal:  LDL (LDLCALC) less than 100   BLOOD PRESSURE American Stroke Association blood pressure target is less that 120/80 mm/Hg  Your discharge blood pressure is:  BP: 110/90 mmHg  Monitor your blood pressure  Limit your salt and alcohol intake  Many individuals will require more than one medication for high blood pressure  DIABETES (A1c is a blood sugar average for last 3 months) Goal HGBA1c is under 7% (HBGA1c is blood sugar average for last 3 months)  Diabetes: No known diagnosis of diabetes    Lab Results  Component Value Date   HGBA1C 6.2* 02/06/2015     Your HGBA1c can be lowered with medications, healthy diet, and exercise.  Check your blood sugar as directed by your physician  Call your physician if you experience unexplained or low blood sugars.  PHYSICAL ACTIVITY/REHABILITATION Goal is 30 minutes at least 4 days per week  Activity: No driving, Therapies:  See above Return to work:  N/A  Activity decreases your risk of heart attack and stroke and makes your heart stronger.  It helps control your weight and blood pressure; helps you relax and can improve your mood.  Participate in a regular exercise program.  Talk with your doctor about the best form of exercise for you (dancing, walking, swimming, cycling).  DIET/WEIGHT Goal is to maintain a healthy weight  Your discharge diet is: Diet Heart Room service appropriate?: Yes; Fluid consistency:: Thin  liquids Your height is:  Height: _0  (167.6 cm) Your current weight  is: Weight: 78.155 kg (172 lb 4.8 oz) Your Body Mass Index (BMI) is:  BMI (Calculated): 27.9  Following the type of diet specifically designed for you will help prevent another stroke.  Your goal weight is: 155 lbs  Your goal Body Mass Index (BMI) is 19-24.  Healthy food habits can help reduce 3 risk factors for stroke:  High cholesterol, hypertension, and excess weight.  RESOURCES  Stroke/Support Group:  Call [phone]   STROKE EDUCATION PROVIDED/REVIEWED AND GIVEN TO PATIENT Stroke warning signs and symptoms How to activate emergency medical system (call 911). Medications prescribed at discharge. Need for follow-up after discharge. Personal risk factors for stroke. Pneumonia vaccine given:  Flu vaccine given:  My questions have been answered, the writing is legible, and I understand these instructions.  I will adhere to these goals & educational materials that have been provided to me after my discharge from the hospital.       My questions have been answered and I understand these instructions. I will adhere to these goals and the provided educational materials after my discharge from the hospital.  Patient/Caregiver Signature _______________________________ Date __________  Clinician Signature _______________________________________ Date __________  Please bring this form and your medication list with you to all your follow-up doctor's appointments.

## 2015-03-01 NOTE — Progress Notes (Signed)
Subjective/Complaints: Pt without c/o, pleased about d/c   .ROS- denies bladder or bowel issues   Objective: Vital Signs: Blood pressure 125/63, pulse 96, temperature 98.3 F (36.8 C), temperature source Oral, resp. rate 18, height _0  (1.676 m), weight 78.155 kg (172 lb 4.8 oz), last menstrual period 12/03/2002, SpO2 94 %. No results found. Results for orders placed or performed during the hospital encounter of 02/22/15 (from the past 72 hour(s))  Basic metabolic panel     Status: None   Collection Time: 02/28/15  4:55 AM  Result Value Ref Range   Sodium 139 135 - 145 mmol/L   Potassium 4.4 3.5 - 5.1 mmol/L   Chloride 103 101 - 111 mmol/L   CO2 31 22 - 32 mmol/L   Glucose, Bld 99 65 - 99 mg/dL   BUN 10 6 - 20 mg/dL   Creatinine, Ser 0.75 0.44 - 1.00 mg/dL   Calcium 9.3 8.9 - 10.3 mg/dL   GFR calc non Af Amer >60 >60 mL/min   GFR calc Af Amer >60 >60 mL/min    Comment: (NOTE) The eGFR has been calculated using the CKD EPI equation. This calculation has not been validated in all clinical situations. eGFR's persistently <60 mL/min signify possible Chronic Kidney Disease.    Anion gap 5 5 - 15     HEENT: nystagmus Cardio: irregular and tachy Resp: CTA B/L and unlabored GI: BS positive and non tender, non distended Extremity:  Pulses positive and No Edema Skin:   Intact Neuro: Confused and Abnormal FMC Ataxic Right FNF, horz nystagmus R>L Musc/Skel:  Normal Gen NAD   Assessment/Plan: 1. Functional deficits secondary to Right cerebellar ICH Stable for D/C today F/u PCP in 1-2 weeks F/u PM&R 3 weeks See D/C summary See D/C instructions FIM: FIM - Bathing Bathing Steps Patient Completed: Chest, Right Arm, Left Arm, Abdomen, Front perineal area, Buttocks, Right upper leg, Left upper leg Bathing: 4: Min-Patient completes 8-9 45f10 parts or 75+ percent  FIM - Upper Body Dressing/Undressing Upper body dressing/undressing steps patient completed: Thread/unthread right  bra strap, Thread/unthread left bra strap, Thread/unthread left sleeve of pullover shirt/dress, Hook/unhook bra, Thread/unthread right sleeve of pullover shirt/dresss, Put head through opening of pull over shirt/dress, Pull shirt over trunk Upper body dressing/undressing: 5: Supervision: Safety issues/verbal cues FIM - Lower Body Dressing/Undressing Lower body dressing/undressing steps patient completed: Thread/unthread right underwear leg, Thread/unthread left underwear leg, Pull underwear up/down, Thread/unthread right pants leg, Thread/unthread left pants leg, Pull pants up/down Lower body dressing/undressing: 5: Supervision: Safety issues/verbal cues  FIM - Toileting Toileting steps completed by patient: Performs perineal hygiene Toileting Assistive Devices: Grab bar or rail for support Toileting: 2: Max-Patient completed 1 of 3 steps  FIM - TRadio producerDevices: Elevated toilet seat, Grab bars Toilet Transfers: 4-To toilet/BSC: Min A (steadying Pt. > 75%), 4-From toilet/BSC: Min A (steadying Pt. > 75%)  FIM - Bed/Chair Transfer Bed/Chair Transfer Assistive Devices: WCopy 5: Supine > Sit: Supervision (verbal cues/safety issues), 5: Sit > Supine: Supervision (verbal cues/safety issues), 4: Chair or W/C > Bed: Min A (steadying Pt. > 75%), 4: Bed > Chair or W/C: Min A (steadying Pt. > 75%)  FIM - Locomotion: Wheelchair Distance: 160 Locomotion: Wheelchair: 0: Activity did not occur FIM - Locomotion: Ambulation Locomotion: Ambulation Assistive Devices: WAdministratorAmbulation/Gait Assistance: 4: Min assist Locomotion: Ambulation: 2: Travels 50 - 149 ft with minimal assistance (Pt.>75%)  Comprehension Comprehension Mode: Auditory Comprehension: 5-Follows  basic conversation/direction: With no assist  Expression Expression Mode: Verbal Expression: 5-Expresses basic needs/ideas: With no assist  Social Interaction Social  Interaction: 5-Interacts appropriately 90% of the time - Needs monitoring or encouragement for participation or interaction.  Problem Solving Problem Solving: 5-Solves basic 90% of the time/requires cueing < 10% of the time  Memory Memory: 5-Recognizes or recalls 90% of the time/requires cueing < 10% of the time  Medical Problem List and Plan: 1. Functional deficits secondary to right cerebellar hemorrhage status post craniotomy evacuation of hematoma 02/05/2015 2. DVT Prophylaxis/Anticoagulation: SCDs. Monitor for any signs of DVT 3. Pain Management: Tylenol as needed 4. Persistent atrial fibrillation with RVR. Cardizem 240 mg daily, Lopressor 37.5 mg twice a day. Cardiac rate controlled. Follow-up cardiology services. Not a candidate for anticoagulation due to hemorrhage, occ variability, rec is to confirm with manual cuff for BP If BP run too low may need to reduce Cardizem and increase BB 5. Neuropsych: This patient is capable of making decisions on her own behalf. 6. Skin/Wound Care: Routine skin checks 7. Fluids/Electrolytes/Nutrition: Routine I&O , repeat BMET normal on 6/27 8. Hyperlipidemia. Lipitor 9. Urinary retention. resolved 10.  Hx of Right breast carcinoma , s/p axillary dissection no BPs RIght arm LOS (Days) 7 A FACE TO FACE EVALUATION WAS PERFORMED  KIRSTEINS,ANDREW E 03/01/2015, 7:07 AM

## 2015-03-01 NOTE — Progress Notes (Signed)
Social Work Elease Hashimoto, LCSW Social Worker Signed  Patient Care Conference 02/28/2015 12:40 PM    Expand All Collapse All   Inpatient RehabilitationTeam Conference and Plan of Care Update Date: 03/01/2015   Time: 10;00 AM     Patient Name: [name]       Medical Record Number: [id number]  Date of Birth: [birth year] Sex: Female         Room/Bed: 4M03C/4M03C-01 Payor Info: Payor: MEDICARE / Plan: MEDICARE PART A AND B / Product Type: *No Product type* /    Admitting Diagnosis: R BRAIN HEMORR   Admit Date/Time:  02/22/2015  4:38 PM Admission Comments: No comment available   Primary Diagnosis:  Cerebellar hemorrhage, nontraumatic Principal Problem: Cerebellar hemorrhage, nontraumatic    Patient Active Problem List     Diagnosis  Date Noted   .  Possible Bradycardia  02/22/2015   .  Cerebellar hemorrhage, nontraumatic  02/22/2015   .  Benign essential HTN  02/21/2015   .  Atrial fibrillation  02/20/2015   .  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  Dizziness     .  Cytotoxic cerebral edema     .  Acute cerebellar hemorrhage     .  Hypokalemia     .  Chronic combined systolic and diastolic congestive heart failure     .  Atrial fibrillation with RVR     .  HLD (hyperlipidemia)     .  Obstructive hydrocephalus     .  Cerebellar hemorrhage  02/05/2015   .  ICH (intracerebral hemorrhage)  02/05/2015   .  COPD bronchitis  12/30/2014   .  GERD (gastroesophageal reflux disease)  12/30/2014   .  Bladder cancer  01/01/2014   .  CHF (congestive heart failure)  01/01/2014   .  Glaucoma  01/01/2014   .  DCIS (ductal carcinoma in situ) of breast  01/01/2014     Expected Discharge Date: Expected Discharge Date: 03/01/15  Team Members Present: Physician leading conference: Dr. Alysia Penna Social Worker Present: Ovidio Kin, LCSW Nurse Present: Heather Roberts, RN PT Present: Other (comment) Rudene Christians Tygielski-PT) OT Present: Simonne Come, OT SLP Present: Gunnar Fusi, SLP PPS Coordinator present : Daiva Nakayama, RN, CRRN        Current Status/Progress  Goal  Weekly Team Focus   Medical       medicine regime adjusted and doing well    medically stable      Bowel/Bladder       cont B & B    cont B & B      Swallow/Nutrition/ Hydration       na         ADL's       min B & D-mod LE dressing.     supervision to min    balance, exercises, ADL's   Mobility     minA transfers, gait with RW, stairs   supervision overall, minA stairs  standing balance, gait with RW, vestibular exercises    Communication       na         Safety/Cognition/ Behavioral Observations      no unsafe behaviors         Pain       less than 3, monitor    pain free      Skin       no issues            *  See Care Plan and progress notes for long and short-term goals.    Barriers to Discharge:    medical stability    Possible Resolutions to Barriers:     no barriers    Discharge Planning/Teaching Needs:     Home with husband and daughter's to continue to assist her and will hire assist if necessary      Team Discussion:    Pt is progressing well and reaching her goals-supervision/min level. Medically stable now have adjusted her meds and medically stable for DC today.   Revisions to Treatment Plan:    DC today       Elease Hashimoto 03/01/2015, 8:42 AM                 Elease Hashimoto, LCSW Social Worker Signed  Patient Care Conference 02/16/2015  1:28 PM    Expand All Collapse All   Inpatient RehabilitationTeam Conference and Plan of Care Update Date: 02/16/2015   Time: 11;20 AM     Patient Name: [name]       Medical Record Number: [id number]  Date of Birth: September 29, 1934 Sex: Female         Room/Bed: 4M03C/4M03C-01 Payor Info: Payor: MEDICARE / Plan: MEDICARE PART A AND B / Product Type: *No Product type* /    Admitting Diagnosis: R cerebellar hemorrhage   Admit Date/Time:  02/14/2015  5:06 PM Admission Comments: No comment available    Primary Diagnosis:  <principal problem not specified> Principal Problem: <principal problem not specified>    Patient Active Problem List     Diagnosis  Date Noted   .  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  Dizziness     .  Cytotoxic cerebral edema     .  Acute cerebellar hemorrhage     .  Hypokalemia     .  Chronic combined systolic and diastolic congestive heart failure     .  Atrial fibrillation with RVR     .  HLD (hyperlipidemia)     .  Obstructive hydrocephalus     .  Cerebellar hemorrhage  02/05/2015   .  COPD bronchitis  12/30/2014   .  GERD (gastroesophageal reflux disease)  12/30/2014   .  Bladder cancer  01/01/2014   .  CHF (congestive heart failure)  01/01/2014   .  Glaucoma  01/01/2014   .  DCIS (ductal carcinoma in situ) of breast  01/01/2014     Expected Discharge Date: Expected Discharge Date: 03/02/15  Team Members Present: Physician leading conference: Dr. Alysia Penna Social Worker Present: Ovidio Kin, LCSW Nurse Present: Heather Roberts, RN PT Present: Raylene Everts, PT;Caroline Lacinda Axon, PT;Other (comment) Rudene Christians Tygielski-PT) OT Present: Meriel Pica, Jules Schick, OT SLP Present: Windell Moulding, SLP PPS Coordinator present : Daiva Nakayama, RN, CRRN        Current Status/Progress  Goal  Weekly Team Focus   Medical     freq urination , dark urine, very fatigued today, poor appetite   home d/c with family support  initiate therapy, improve appetite    Bowel/Bladder     Continent of bowel and bladder. LBM 02/12/15. Pt refusing colace and Sorbitol  Managed bowel   BM q 2-3 days. Continue to educate regarding need for laxative    Swallow/Nutrition/ Hydration       na         ADL's     mod assist transfers, mod assist bathing, min assist  UB dressing, max assist LB dressing  supervision overall  dynamic sitting and standing balance, transfers, RUE NMR   Mobility     mod/maxA for transfers, stairs, gait with +2 HHA or RW  supervision overall, minA  stairs  Standing balance, gait, coordination    Communication       na         Safety/Cognition/ Behavioral Observations    min assist for semi-complex, decreased functional problem solving and emergent/anticipatory awareness of deficits    supervision   education and carryover of compensatory strategies    Pain     No c/o pain  <3  Monitor for nonverbal cues of pain    Skin     L elbow pink, Suboccipital crani with dermabond.-healing appropriately  No additional skin breakdown  Encourage to turn q 2hrs      *See Care Plan and progress notes for long and short-term goals.    Barriers to Discharge:  pulsion to Right, fatigue     Possible Resolutions to Barriers:   vestibular eval     Discharge Planning/Teaching Needs:   Home with husband and son, daughter's to be assisting.  Aware will need 24 hr care at discharge       Team Discussion:    Goals-overall supervision level-min-stairs.  Needs to space out therapies-due to deconditioned. Posterior lean to right. Vestibular eval-PT. Check UA, constipated working on this. Supportive family-here observing   Revisions to Treatment Plan:    None    Continued Need for Acute Rehabilitation Level of Care: The patient requires daily medical management by a physician with specialized training in physical medicine and rehabilitation for the following conditions: Daily direction of a multidisciplinary physical rehabilitation program to ensure safe treatment while eliciting the highest outcome that is of practical value to the patient.: Yes Daily medical management of patient stability for increased activity during participation in an intensive rehabilitation regime.: Yes Daily analysis of laboratory values and/or radiology reports with any subsequent need for medication adjustment of medical intervention for : Neurological problems;Other;Post surgical problems  Elease Hashimoto 02/16/2015, 1:28 PM                  Patient ID: [name], female   DOB: 10-24-1934, 80 y.o.   MRN: [id number]

## 2015-03-01 NOTE — Progress Notes (Signed)
Patient discharged to home with daughters. Patient discharged with all belongings and discharge instructions via Reesa Chew PA. Patient and daughter denied any questions about instructions.

## 2015-03-01 NOTE — Plan of Care (Signed)
Problem: RH KNOWLEDGE DEFICIT Goal: RH STG INCREASE KNOWLEDGE OF HYPERTENSION Outcome: Completed/Met Date Met:  03/01/15 With family-parameters for BP meds

## 2015-03-02 ENCOUNTER — Telehealth: Payer: Self-pay | Admitting: Cardiology

## 2015-03-02 ENCOUNTER — Telehealth: Payer: Self-pay

## 2015-03-02 DIAGNOSIS — I5030 Unspecified diastolic (congestive) heart failure: Secondary | ICD-10-CM | POA: Diagnosis not present

## 2015-03-02 DIAGNOSIS — J449 Chronic obstructive pulmonary disease, unspecified: Secondary | ICD-10-CM | POA: Diagnosis not present

## 2015-03-02 DIAGNOSIS — I4891 Unspecified atrial fibrillation: Secondary | ICD-10-CM | POA: Diagnosis not present

## 2015-03-02 DIAGNOSIS — I69098 Other sequelae following nontraumatic subarachnoid hemorrhage: Secondary | ICD-10-CM | POA: Diagnosis not present

## 2015-03-02 DIAGNOSIS — M150 Primary generalized (osteo)arthritis: Secondary | ICD-10-CM | POA: Diagnosis not present

## 2015-03-02 DIAGNOSIS — H538 Other visual disturbances: Secondary | ICD-10-CM | POA: Diagnosis not present

## 2015-03-02 DIAGNOSIS — I10 Essential (primary) hypertension: Secondary | ICD-10-CM | POA: Diagnosis not present

## 2015-03-02 DIAGNOSIS — Z853 Personal history of malignant neoplasm of breast: Secondary | ICD-10-CM | POA: Diagnosis not present

## 2015-03-02 DIAGNOSIS — Z8551 Personal history of malignant neoplasm of bladder: Secondary | ICD-10-CM | POA: Diagnosis not present

## 2015-03-02 DIAGNOSIS — E785 Hyperlipidemia, unspecified: Secondary | ICD-10-CM | POA: Diagnosis not present

## 2015-03-02 NOTE — Telephone Encounter (Signed)
Pt admitted for hemorrhagic stoke. Appt with Dr. Ronnald Ramp on July 5th, 2016 at 1:15  Transition Care Management Follow-up Telephone Call  How have you been since you were released from the hospital? Pt is improving daily  Do you understand why you were in the hospital? Pt understands  Do you understand the discharge instrcutions? Pt understands  Where were you discharged to? Rehabilitation and now is home.   Items Reviewed:  Medications reviewed: cardiology  Allergies reviewed: no new allergies  Dietary changes reviewed:  No new diet changes indication. Appetite increase.   Referrals reviewed: no new referrals  Functional Questionnaire:   Activities of Daily Living (ADLs):   She states they are independent in the following: independent with assistance. States they require assistance with the following: not able to walk by herself, but is currently in PT.   Any transportation issues/concerns?: no concerns  Any patient concerns? No concerns.   Confirmed importance and date/time of follow-up visits scheduled: pt and family understands the importance and the scheduled date and time.   Confirmed with patient if condition begins to worsen call PCP or go to the ER.  Patient was given the office number and encouraged to call back with question or concerns: Pt and family states understanding.

## 2015-03-02 NOTE — Telephone Encounter (Signed)
Spoke with pt dtr, questions regarding discharge medications answered. They are going to monitor her bp and pulse and let us know of their concerns.

## 2015-03-02 NOTE — Telephone Encounter (Signed)
Pt had a stroke,just discharged from the hospital yesterday. Please call,some questions about her blood pressure getting low and what to do.

## 2015-03-03 ENCOUNTER — Encounter: Payer: Self-pay | Admitting: *Deleted

## 2015-03-03 ENCOUNTER — Telehealth: Payer: Self-pay | Admitting: *Deleted

## 2015-03-03 DIAGNOSIS — I10 Essential (primary) hypertension: Secondary | ICD-10-CM | POA: Diagnosis not present

## 2015-03-03 DIAGNOSIS — I4891 Unspecified atrial fibrillation: Secondary | ICD-10-CM | POA: Diagnosis not present

## 2015-03-03 DIAGNOSIS — I69098 Other sequelae following nontraumatic subarachnoid hemorrhage: Secondary | ICD-10-CM | POA: Diagnosis not present

## 2015-03-03 DIAGNOSIS — H538 Other visual disturbances: Secondary | ICD-10-CM | POA: Diagnosis not present

## 2015-03-03 DIAGNOSIS — J449 Chronic obstructive pulmonary disease, unspecified: Secondary | ICD-10-CM | POA: Diagnosis not present

## 2015-03-03 DIAGNOSIS — I5030 Unspecified diastolic (congestive) heart failure: Secondary | ICD-10-CM | POA: Diagnosis not present

## 2015-03-03 NOTE — Telephone Encounter (Signed)
Spoke with [name], Aware of dr Jacalyn Lefevre recommendations.  They will cont to monitor and call with concerns.

## 2015-03-03 NOTE — Telephone Encounter (Signed)
Spoke with pt dtr, this am prior to meds her bp was 113/83 with pulse of 72. After taking cardizem her bp is now 96/65 with pulse 79. She is due to get her metoprolol at 9:30 am, she is having dizziness. Discussed with dr Stanford Breed, pt to cont to hold metoprolol for bp <100. For bp >100 take metoprolol 25 mg twice daily. She will cont the cardizem. Follow up scheduled

## 2015-03-04 ENCOUNTER — Telehealth: Payer: Self-pay | Admitting: Cardiology

## 2015-03-04 DIAGNOSIS — I69098 Other sequelae following nontraumatic subarachnoid hemorrhage: Secondary | ICD-10-CM | POA: Diagnosis not present

## 2015-03-04 DIAGNOSIS — I10 Essential (primary) hypertension: Secondary | ICD-10-CM | POA: Diagnosis not present

## 2015-03-04 DIAGNOSIS — I5030 Unspecified diastolic (congestive) heart failure: Secondary | ICD-10-CM | POA: Diagnosis not present

## 2015-03-04 DIAGNOSIS — H538 Other visual disturbances: Secondary | ICD-10-CM | POA: Diagnosis not present

## 2015-03-04 DIAGNOSIS — I4891 Unspecified atrial fibrillation: Secondary | ICD-10-CM | POA: Diagnosis not present

## 2015-03-04 DIAGNOSIS — J449 Chronic obstructive pulmonary disease, unspecified: Secondary | ICD-10-CM | POA: Diagnosis not present

## 2015-03-04 NOTE — Telephone Encounter (Signed)
Home care SLP called from patient's home reporting - patient had BP of 115/64 w rate ~75 this AM.  Prior to morning meds. Pt had received instruction from home care agency to hold metoprolol, continue all other meds. Repeat BPs were 100/62, then 87/64.  Pt not symptomatic, no dizziness on standing, no weakness, no faintness/near syncope reported.  Advised them typically not a need to hold any meds unless symptomatic/systolic BP <131. Asked them/patient to call if problems - for now, to continue meds as directed, instruct pt to drink adequate fluids.  Caller voiced understanding of instructions given.

## 2015-03-06 NOTE — Progress Notes (Signed)
HPI: 80 year old female for follow-up of atrial fibrillation. Echocardiogram June 2016 showed vigorous LV function, mild left ventricular hypertrophy, grade 1 diastolic dysfunction and mild right atrial enlargement. Patient did have postoperative atrial fibrillation in July 2010. Patient was admitted in June 2016 with a large right cerebellar hemorrhage. Note her only anticoagulate at that time was aspirin. The hemorrhage required surgical evacuation. Patient developed atrial fibrillation during the hospitalization with difficult to control heart rate. She was felt not to be a candidate for anticoagulation given intracranial hemorrhage. No TSH was mildly decreased during recent admission but free T4 was normal. Patient has had some difficulty with borderline blood pressures and heart rate at home. Patient denies dyspnea, chest pain, palpitations or syncope. No pedal edema. Does have significant constipation. She still has some problems with balance and dizziness related to her recent intracranial hemorrhage.  Current Outpatient Prescriptions  Medication Sig Dispense Refill  . acetaminophen (TYLENOL) 325 MG tablet Take 2 tablets (650 mg total) by mouth every 4 (four) hours as needed for headache or mild pain.    Marland Kitchen atorvastatin (LIPITOR) 10 MG tablet Take 1 tablet (10 mg total) by mouth daily at 6 PM. 30 tablet 0  . diltiazem (CARDIZEM CD) 240 MG 24 hr capsule Take 1 capsule (240 mg total) by mouth daily. 30 capsule 1  . latanoprost (XALATAN) 0.005 % ophthalmic solution Place 1 drop into both eyes at bedtime.    . levalbuterol (XOPENEX HFA) 45 MCG/ACT inhaler Inhale 1-2 puffs into the lungs every 4 (four) hours as needed for wheezing.    . metoprolol tartrate (LOPRESSOR) 25 MG tablet Take 25 mg by mouth as needed.  0  . pantoprazole (PROTONIX) 40 MG tablet Take 1 tablet (40 mg total) by mouth daily. 30 tablet 1  . polyethylene glycol (MIRALAX / GLYCOLAX) packet Take 17 g by mouth 3 (three) times a  week.    Marland Kitchen SIMBRINZA 1-0.2 % SUSP Place 1 drop into both eyes 2 (two) times daily.   99   No current facility-administered medications for this visit.     Past Medical History  Diagnosis Date  . History of CHF (congestive heart failure)     POST SURG 2010  . COPD (chronic obstructive pulmonary disease)   . History of breast cancer     DX DCIS IN 2004--  S/P RIGHT MASTECTOMY , NO CHEMORADIATION---  NO RECURRENCE  . Glaucoma of both eyes   . History of atrial fibrillation without current medication 2010    POST SURG 2010--  PER PT NO ISSUES SINCE  . Arthritis   . Dyspnea on exertion   . GERD (gastroesophageal reflux disease)   . Nephrolithiasis   . History of shingles     02/ 2015--  back of neck and left flank--  no residual pain  . History of hypertension   . Recurrent bladder papillary carcinoma first dx 06/ 2014    s/p  turbt's  and instillation mitomycin c (chemo)  . ICH (intracerebral hemorrhage) 02/05/2015    Past Surgical History  Procedure Laterality Date  . Total knee arthroplasty Right 03-22-2009  . Partial mastectomy with needle localization Right 01-14-2003    DCIS  . Total mastectomy Right 02-03-2003    W/ SLN BX  AND  POST 03-01-2003 EVACUATION HEMATOMA  . Removal cyst left hand  2013  . Transthoracic echocardiogram  03-25-2009    MILD LVF/ EF 70-80%/ MILD INCREASE SYSTOLIC PULMONARY  PRESSURE  . Transurethral  resection of bladder tumor with gyrus (turbt-gyrus) N/A 03/26/2013    Procedure: TRANSURETHRAL RESECTION OF BLADDER TUMOR WITH GYRUS (TURBT-GYRUS);  Surgeon: Alexis Frock, MD;  Location: [facility];  Service: Urology;  Laterality: N/A;  . Cystoscopy w/ ureteral stent placement Bilateral 03/26/2013    Procedure: CYSTOSCOPY WITH BILATERAL RETROGRADE PYELOGRAM/URETERAL STENT PLACEMENT;  Surgeon: Alexis Frock, MD;  Location: [facility];  Service: Urology;  Laterality: Bilateral;  . Transurethral resection of bladder tumor with  gyrus (turbt-gyrus) N/A 05/13/2013    Procedure: TRANSURETHRAL RESECTION OF BLADDER TUMOR WITH GYRUS (TURBT-GYRUS)  RE-STAGING TRANSURETHRAL RESECTION OF BLADDER TUMOR, LEFT RETROGRADE PYELOGRAM AND STENT EXCHANGE;  Surgeon: Alexis Frock, MD;  Location: [facility];  Service: Urology;  Laterality: N/A;  . Cystoscopy w/ ureteral stent placement Left 05/13/2013    Procedure: CYSTOSCOPY WITH RETROGRADE PYELOGRAM/URETERAL STENT PLACEMENT STENT EXCHANGE;  Surgeon: Alexis Frock, MD;  Location: [facility];  Service: Urology;  Laterality: Left;  . Tubal ligation    . Cataract extraction w/ intraocular lens  implant, bilateral    . Transurethral resection of bladder tumor with gyrus (turbt-gyrus) N/A 08/11/2014    Procedure: TRANSURETHRAL RESECTION OF BLADDER TUMOR WITH GYRUS (TURBT-GYRUS);  Surgeon: Alexis Frock, MD;  Location: [facility];  Service: Urology;  Laterality: N/A;  . Cystoscopy w/ retrogrades Bilateral 08/11/2014    Procedure: CYSTOSCOPY WITH BILATERAL RETROGRADE PYELOGRAM AND MITOMYCIN INSTILLATION;  Surgeon: Alexis Frock, MD;  [facility];  Service: Urology;  Laterality: Bilateral;  . Craniectomy N/A 02/05/2015    Procedure: SUBOCCIPITAL CRANIECTOMY EVACUATION OF HEMATOMA;  Surgeon: Consuella Lose, MD;  Location: [facility];  Service: Neurosurgery;  Laterality: N/A;    History   Social History  . Marital Status: Married    Spouse Name: N/A  . Number of Children: N/A  . Years of Education: N/A   Occupational History  . Not on file.   Social History Main Topics  . Smoking status: Former Smoker -- 1.00 packs/day for 40 years    Types: Cigarettes    Quit date: 03/20/2003  . Smokeless tobacco: Never Used  . Alcohol Use: No  . Drug Use: No  . Sexual Activity: Not on file   Other Topics Concern  . Not on file   Social History Narrative    ROS: constipation but no fevers or chills, productive cough,  hemoptysis, dysphasia, odynophagia, melena, hematochezia, dysuria, hematuria, rash, seizure activity, orthopnea, PND, pedal edema, claudication. Remaining systems are negative.  Physical Exam: Well-developed well-nourished in no acute distress.  Skin is warm and dry.  HEENT status post recent craniotomy Neck is supple.  Chest is clear to auscultation with normal expansion.  Cardiovascular exam is irregular Abdominal exam nontender or distended. No masses palpated. Extremities show no edema. neuro residual R side weakness  ECG atrial fibrillation at a rate of 113. Cannot rule out prior anterior infarct. Nonspecific ST changes.

## 2015-03-07 DIAGNOSIS — H538 Other visual disturbances: Secondary | ICD-10-CM | POA: Diagnosis not present

## 2015-03-07 DIAGNOSIS — I5030 Unspecified diastolic (congestive) heart failure: Secondary | ICD-10-CM | POA: Diagnosis not present

## 2015-03-07 DIAGNOSIS — I4891 Unspecified atrial fibrillation: Secondary | ICD-10-CM | POA: Diagnosis not present

## 2015-03-07 DIAGNOSIS — I10 Essential (primary) hypertension: Secondary | ICD-10-CM | POA: Diagnosis not present

## 2015-03-07 DIAGNOSIS — I69098 Other sequelae following nontraumatic subarachnoid hemorrhage: Secondary | ICD-10-CM | POA: Diagnosis not present

## 2015-03-07 DIAGNOSIS — J449 Chronic obstructive pulmonary disease, unspecified: Secondary | ICD-10-CM | POA: Diagnosis not present

## 2015-03-08 ENCOUNTER — Ambulatory Visit (INDEPENDENT_AMBULATORY_CARE_PROVIDER_SITE_OTHER): Payer: Medicare Other | Admitting: Cardiology

## 2015-03-08 ENCOUNTER — Encounter: Payer: Self-pay | Admitting: Cardiology

## 2015-03-08 ENCOUNTER — Inpatient Hospital Stay: Payer: Medicare Other | Admitting: Internal Medicine

## 2015-03-08 VITALS — BP 114/70 | HR 82 | Ht 67.0 in | Wt 150.0 lb

## 2015-03-08 DIAGNOSIS — K5909 Other constipation: Secondary | ICD-10-CM | POA: Diagnosis not present

## 2015-03-08 DIAGNOSIS — I4819 Other persistent atrial fibrillation: Secondary | ICD-10-CM

## 2015-03-08 DIAGNOSIS — I4891 Unspecified atrial fibrillation: Secondary | ICD-10-CM

## 2015-03-08 DIAGNOSIS — I481 Persistent atrial fibrillation: Secondary | ICD-10-CM

## 2015-03-08 DIAGNOSIS — I10 Essential (primary) hypertension: Secondary | ICD-10-CM

## 2015-03-08 DIAGNOSIS — K5900 Constipation, unspecified: Secondary | ICD-10-CM | POA: Insufficient documentation

## 2015-03-08 NOTE — Assessment & Plan Note (Signed)
Blood pressure controlled. Continue present medications. 

## 2015-03-08 NOTE — Patient Instructions (Signed)
Your physician recommends that you schedule a follow-up appointment in: [name] has recommended that you wear a 48 HOUR holter monitor. Holter monitors are medical devices that record the heart's electrical activity. Doctors most often use these monitors to diagnose arrhythmias. Arrhythmias are problems with the speed or rhythm of the heartbeat. The monitor is a small, portable device. You can wear one while you do your normal daily activities. This is usually used to diagnose what is causing palpitations/syncope (passing out).PLEASE SCHEDULE AT [place] OFFICE  Your physician recommends that you return for lab work SAME DAY AS MONITOR APPT

## 2015-03-08 NOTE — Assessment & Plan Note (Signed)
Patient remains in atrial fibrillation. Continue Cardizem and metoprolol for rate control. I will schedule a 48 hour Holter monitor to make sure that her rate is adequately controlled. Given recent intracranial hemorrhage doubt she will ever be a candidate for anticoagulation. We will review with neurosurgery. We therefore cannot proceed with cardioversion. Her risk of an embolic event is higher as her CHADSvasc is 4. However at this point we have no choice.

## 2015-03-08 NOTE — Assessment & Plan Note (Signed)
I have recommended over-the-counter fleets enema.

## 2015-03-08 NOTE — Progress Notes (Signed)
Physical Therapy Discharge Summary  Patient Details  Name: [name] MRN: [id number] Date of Birth: May 18, 1935   Patient has met 4 of 9 long term goals due to improved activity tolerance, improved balance, improved postural control, increased strength, ability to compensate for deficits, functional use of  right upper extremity and right lower extremity, improved attention, improved awareness and improved coordination.  Patient to discharge at ambulatory level with short distances at [facility] from family, w/c level for community distances. level Min Assist.   Patient's care partner is independent to provide the necessary physical and cognitive assistance at discharge.  Reasons goals not met: Per family request pt discharged from hospital earlier than anticipated. Pt d/c at Dudley level for all mobility; goals set for supervision. Family cleared to perform minA with all mobility tasks including transfers, gait and stairs. Therapist determined pt safe for d/c after completion of all family education based on clinical judgement.   Recommendation:  Patient will benefit from ongoing skilled PT services in home health setting to continue to advance safe functional mobility, address ongoing impairments in balance, postural control, coordination, strength, vestibular impairments, and minimize fall risk.  Equipment: w/c and RW  Reasons for discharge: discharge from hospital  Patient/family agrees with progress made and goals achieved: Yes  PT Discharge Precautions/Restrictions Precautions Precautions: Fall Precaution Comments: truncal ataxia, LUE ataxia Restrictions Weight Bearing Restrictions: No Pain  Pt reports no c/o pain. Vision/Perception  Vision - Assessment Eye Alignment: Impaired (comment) Ocular Range of Motion: Restricted on the right Tracking/Visual Pursuits: Decreased smoothness of horizontal tracking;Decreased smoothness of vertical tracking Diplopia Assessment: Disappears  with one eye closed;Objects split side to side;Only with right gaze Additional Comments: Impaired gaze stabilization, gaze evoked nystagmus with far R/L boundaries  Cognition Overall Cognitive Status: Impaired/Different from baseline Arousal/Alertness: Awake/alert Orientation Level: Oriented X4 Attention: Selective Sustained Attention: Appears intact Selective Attention: Appears intact Memory: Appears intact Awareness: Impaired Awareness Impairment: Emergent impairment;Anticipatory impairment Safety/Judgment: Impaired Comments: Poor awareness of deficits Sensation Sensation Light Touch: Appears Intact Stereognosis: Not tested Hot/Cold: Not tested Proprioception: Impaired by gross assessment Coordination Gross Motor Movements are Fluid and Coordinated: No Fine Motor Movements are Fluid and Coordinated: No Coordination and Movement Description: Pt continues to have ataxia with functional mobility  Heel Shin Test: dysmetria R>L Motor  Motor Motor: Hemiplegia;Ataxia;Abnormal postural alignment and control Motor - Discharge Observations: Continued impairments in perception of upright/vertical increased when fatigued, improved symmetry of functional LE movements, reduced although still present ataxia  Mobility Bed Mobility Bed Mobility: Sit to Supine;Supine to Sit Rolling Right: 5: Supervision Rolling Right Details: Verbal cues for precautions/safety Rolling Left: 5: Supervision Rolling Left Details: Verbal cues for precautions/safety Right Sidelying to Sit: 5: Supervision;HOB flat Right Sidelying to Sit Details: Verbal cues for technique Supine to Sit: 5: Supervision Supine to Sit Details: Verbal cues for sequencing Sit to Supine: 5: Supervision;HOB flat Sit to Supine - Details: Verbal cues for sequencing Transfers Transfers: Yes Stand Pivot Transfers: 4: Min assist Stand Pivot Transfer Details: Tactile cues for weight shifting;Verbal cues for sequencing;Verbal cues for  precautions/safety Stand Pivot Transfer Details (indicate cue type and reason): Cues for R foot progression/placement, appropriate hand placement on support surface prior to stand and reach back prior to sitting Locomotion  Ambulation Ambulation: Yes Ambulation/Gait Assistance: 4: Min assist Ambulation Distance (Feet): 90 Feet Assistive device: Rolling walker Ambulation/Gait Assistance Details: Tactile cues for weight shifting;Verbal cues for precautions/safety Ambulation/Gait Assistance Details: tactile cues for inc L weight shifting to improve R  foot clearance, verbal cues for step symmetry and RW management Gait Gait: Yes Gait Pattern: Impaired Gait Pattern: Narrow base of support;Poor foot clearance - right;Right flexed knee in stance Stairs / Additional Locomotion Stairs: Yes Stairs Assistance: 4: Min assist Stairs Assistance Details: Verbal cues for sequencing;Verbal cues for precautions/safety;Tactile cues for weight shifting Stairs Assistance Details (indicate cue type and reason): cues for weight shifting to L to A with R foot clearance, verbal/tactile cues for upright posture Stair Management Technique: Two rails;Forwards;Alternating pattern Number of Stairs: 4 Height of Stairs: 6 Ramp: 4: Min assist Curb: 4: Min Chemical engineer: Yes Wheelchair Assistance: 5: Investment banker, operational Details: Verbal cues for Information systems manager: Both lower extermities Wheelchair Parts Management: Needs assistance Distance: 160  Trunk/Postural Assessment  Cervical Assessment Cervical Assessment: Within Functional Limits Thoracic Assessment Thoracic Assessment: Within Functional Limits Lumbar Assessment Lumbar Assessment: Within Functional Limits Postural Control Postural Control: Deficits on evaluation Righting Reactions: slow and inadequate; ataxic Protective Responses: slowed and inadequate; ataxic   Balance Balance Balance Assessed: Yes Static Sitting Balance Static Sitting - Balance Support: Feet supported;No upper extremity supported Dynamic Sitting Balance Dynamic Sitting - Balance Support: Feet supported;During functional activity Sitting balance - Comments: supervision d/t impulsivity Static Standing Balance Static Standing - Balance Support: Bilateral upper extremity supported Static Standing - Level of Assistance: 4: Min assist Dynamic Standing Balance Dynamic Standing - Balance Support: Bilateral upper extremity supported Dynamic Standing - Level of Assistance: 4: Min assist Extremity Assessment  RUE Assessment RUE Assessment: Within Functional Limits LUE Assessment LUE Assessment: Within Functional Limits RLE Assessment RLE Assessment: Exceptions to [facility] RLE AROM (degrees) Overall AROM Right Lower Extremity: Within functional limits for tasks assessed RLE Strength RLE Overall Strength: Deficits RLE Overall Strength Comments: hip flex grossly 3+/5, hip ext 4/5, knee flex/ext 4/5, DF 4/5 (did not seem to have full ROM) LLE Assessment LLE Assessment: Exceptions to WFL LLE AROM (degrees) Overall AROM Left Lower Extremity: Within functional limits for tasks assessed LLE Strength LLE Overall Strength: Deficits LLE Overall Strength Comments: hip flex 3+/5, hip ext 4/5, knee flex/ext 4/5, ankle DF 4/5  See FIM for current functional status  Luberta Mutter 03/08/2015, 3:46 PM

## 2015-03-09 DIAGNOSIS — J449 Chronic obstructive pulmonary disease, unspecified: Secondary | ICD-10-CM | POA: Diagnosis not present

## 2015-03-09 DIAGNOSIS — I10 Essential (primary) hypertension: Secondary | ICD-10-CM | POA: Diagnosis not present

## 2015-03-09 DIAGNOSIS — H538 Other visual disturbances: Secondary | ICD-10-CM | POA: Diagnosis not present

## 2015-03-09 DIAGNOSIS — I5030 Unspecified diastolic (congestive) heart failure: Secondary | ICD-10-CM | POA: Diagnosis not present

## 2015-03-09 DIAGNOSIS — I4891 Unspecified atrial fibrillation: Secondary | ICD-10-CM | POA: Diagnosis not present

## 2015-03-09 DIAGNOSIS — I69098 Other sequelae following nontraumatic subarachnoid hemorrhage: Secondary | ICD-10-CM | POA: Diagnosis not present

## 2015-03-09 NOTE — Progress Notes (Signed)
Occupational Therapy Discharge Summary  Patient Details  Name: [name] MRN: [id number] Date of Birth: 07/02/1935   Patient has met 9 of 12 long term goals due to improved activity tolerance, improved balance, postural control, ability to compensate for deficits, functional use of  RIGHT upper extremity and improved awareness.  Patient to discharge at [facility] Assist level. Pt continues to have mild RUE weakness, ataxia, and decreased coordination but [facility] for self-care tasks. Patient's care partner is independent to provide the necessary physical and cognitive assistance at discharge.  Patient's daughters were constantly present and attentive during therapy sessions, they demonstrated ability and competence to provide necessary assistance.  In fact requesting pt d/c home early as they felt comfortable providing assist at her current level.  Reasons goals not met: Pt discharge from hospital earlier than anticipated per family request.  Pt requires min assist with toileting, bathing, and dynamic standing balance at this time, not reaching supervision level goals.  Due to ongoing family education and pt's family demonstrating safety to provide assist at min assist level and per their request, pt discharged home with family at min assist level.  Recommendation:  Patient will benefit from ongoing skilled OT services in home health setting to continue to advance functional skills in the area of BADL, iADL and Reduce care partner burden.  Equipment: No equipment provided  Reasons for discharge: discharge from hospital  Patient/family agrees with progress made and goals achieved: Yes   See FIM for current functional status  Margery Szostak, [facility] 03/09/2015, 3:52 PM

## 2015-03-10 ENCOUNTER — Telehealth: Payer: Self-pay | Admitting: Cardiology

## 2015-03-10 ENCOUNTER — Encounter: Payer: Self-pay | Admitting: Internal Medicine

## 2015-03-10 ENCOUNTER — Ambulatory Visit (INDEPENDENT_AMBULATORY_CARE_PROVIDER_SITE_OTHER): Payer: Medicare Other | Admitting: Internal Medicine

## 2015-03-10 VITALS — BP 102/60 | HR 104 | Temp 97.5°F | Resp 16 | Ht 67.0 in | Wt 151.2 lb

## 2015-03-10 DIAGNOSIS — I611 Nontraumatic intracerebral hemorrhage in hemisphere, cortical: Secondary | ICD-10-CM

## 2015-03-10 DIAGNOSIS — H538 Other visual disturbances: Secondary | ICD-10-CM | POA: Diagnosis not present

## 2015-03-10 DIAGNOSIS — I4891 Unspecified atrial fibrillation: Secondary | ICD-10-CM | POA: Diagnosis not present

## 2015-03-10 DIAGNOSIS — I69098 Other sequelae following nontraumatic subarachnoid hemorrhage: Secondary | ICD-10-CM | POA: Diagnosis not present

## 2015-03-10 DIAGNOSIS — K5909 Other constipation: Secondary | ICD-10-CM | POA: Diagnosis not present

## 2015-03-10 DIAGNOSIS — I614 Nontraumatic intracerebral hemorrhage in cerebellum: Secondary | ICD-10-CM

## 2015-03-10 DIAGNOSIS — I10 Essential (primary) hypertension: Secondary | ICD-10-CM | POA: Diagnosis not present

## 2015-03-10 DIAGNOSIS — R7989 Other specified abnormal findings of blood chemistry: Secondary | ICD-10-CM

## 2015-03-10 DIAGNOSIS — J449 Chronic obstructive pulmonary disease, unspecified: Secondary | ICD-10-CM | POA: Diagnosis not present

## 2015-03-10 DIAGNOSIS — I5030 Unspecified diastolic (congestive) heart failure: Secondary | ICD-10-CM | POA: Diagnosis not present

## 2015-03-10 DIAGNOSIS — R946 Abnormal results of thyroid function studies: Secondary | ICD-10-CM

## 2015-03-10 MED ORDER — LINACLOTIDE 145 MCG PO CAPS: 145.0000 ug | ORAL_CAPSULE | Freq: Every day | ORAL | Status: DC

## 2015-03-10 NOTE — Progress Notes (Signed)
Subjective:  Patient ID: [name], female    DOB: April 05, 1935  Age: 80 y.o. MRN: [id number]  CC: Cerebrovascular Accident and Constipation   HPI [name] presents for follow up on ICH with rt sided mild paralysis - she is here with 2 daughters today and feels like she is improving with improving strength in her rt face/arm/leg, she is walking more with a walker and is doing well with OT and PT. She complains of constipation and she tells me that about 10 days ago she did not have a BM for about 1 week and then she had to have an enema about 3 days ago and has had a few BM's since then with the help of miralax.  Outpatient Prescriptions Prior to Visit  Medication Sig Dispense Refill  . acetaminophen (TYLENOL) 325 MG tablet Take 2 tablets (650 mg total) by mouth every 4 (four) hours as needed for headache or mild pain.    Marland Kitchen atorvastatin (LIPITOR) 10 MG tablet Take 1 tablet (10 mg total) by mouth daily at 6 PM. 30 tablet 0  . diltiazem (CARDIZEM CD) 240 MG 24 hr capsule Take 1 capsule (240 mg total) by mouth daily. 30 capsule 1  . latanoprost (XALATAN) 0.005 % ophthalmic solution Place 1 drop into both eyes at bedtime.    . levalbuterol (XOPENEX HFA) 45 MCG/ACT inhaler Inhale 1-2 puffs into the lungs every 4 (four) hours as needed for wheezing.    . metoprolol tartrate (LOPRESSOR) 25 MG tablet Take 25 mg by mouth as needed.  0  . pantoprazole (PROTONIX) 40 MG tablet Take 1 tablet (40 mg total) by mouth daily. 30 tablet 1  . SIMBRINZA 1-0.2 % SUSP Place 1 drop into both eyes 2 (two) times daily.   99  . polyethylene glycol (MIRALAX / GLYCOLAX) packet Take 17 g by mouth daily.      No facility-administered medications prior to visit.    ROS Review of Systems  Constitutional: Positive for fatigue. Negative for fever, chills, diaphoresis, activity change, appetite change and unexpected weight change.  HENT: Negative.  Negative for trouble swallowing and voice change.   Eyes:  Positive for visual disturbance (BV right eye).  Respiratory: Negative.  Negative for cough, choking, chest tightness, shortness of breath and stridor.   Cardiovascular: Negative.  Negative for chest pain, palpitations and leg swelling.  Gastrointestinal: Positive for constipation. Negative for nausea, vomiting, abdominal pain, diarrhea, blood in stool, abdominal distention, anal bleeding and rectal pain.  Endocrine: Negative.   Genitourinary: Negative.   Musculoskeletal: Positive for gait problem. Negative for back pain, joint swelling, arthralgias, neck pain and neck stiffness.  Skin: Negative.  Negative for rash.  Neurological: Positive for dizziness, facial asymmetry, speech difficulty, weakness and numbness. Negative for tremors, seizures, syncope, light-headedness and headaches.  Hematological: Negative.  Negative for adenopathy. Does not bruise/bleed easily.  Psychiatric/Behavioral: Negative.     Objective:  BP 102/60 mmHg  Pulse 126  Temp(Src) 97.5 F (36.4 C) (Oral)  Ht _0  (1.702 m)  Wt 151 lb 4 oz (68.607 kg)  BMI 23.68 kg/m2  SpO2 96%  LMP 12/03/2002  BP Readings from Last 3 Encounters:  03/10/15 102/60  03/08/15 114/70  03/01/15 98/64    Wt Readings from Last 3 Encounters:  03/10/15 151 lb 4 oz (68.607 kg)  03/08/15 150 lb (68.04 kg)  02/22/15 172 lb 4.8 oz (78.155 kg)    Physical Exam  Constitutional: She is oriented to person, place, and  time. She appears well-developed and well-nourished. No distress.  HENT:  Mouth/Throat: Oropharynx is clear and moist. No oropharyngeal exudate.  Eyes: Conjunctivae are normal. Right eye exhibits no discharge. Left eye exhibits no discharge. No scleral icterus.  Neck: Normal range of motion. Neck supple. No JVD present. No tracheal deviation present. No thyromegaly present.  Cardiovascular: Normal rate.  An irregularly irregular rhythm present. Exam reveals no gallop.   No murmur heard. Pulses:      Carotid pulses are 1+  on the right side, and 1+ on the left side.      Radial pulses are 1+ on the right side, and 1+ on the left side.       Femoral pulses are 1+ on the right side, and 1+ on the left side.      Popliteal pulses are 1+ on the right side, and 1+ on the left side.       Dorsalis pedis pulses are 1+ on the right side, and 1+ on the left side.       Posterior tibial pulses are 1+ on the right side, and 1+ on the left side.  Pulmonary/Chest: Effort normal and breath sounds normal. No stridor. No respiratory distress. She has no wheezes. She has no rales. She exhibits no tenderness.  Abdominal: Soft. Bowel sounds are normal. She exhibits no distension and no mass. There is no tenderness. There is no rebound and no guarding.  Musculoskeletal: Normal range of motion. She exhibits no edema or tenderness.  Lymphadenopathy:    She has no cervical adenopathy.  Neurological: She is alert and oriented to person, place, and time. She displays no atrophy, no tremor and normal reflexes. A cranial nerve deficit (rt facial weakness) is present. No sensory deficit. She exhibits abnormal muscle tone. She displays no seizure activity. Coordination and gait abnormal. She displays Babinski's sign on the right side. She displays no Babinski's sign on the left side.  Reflex Scores:      Tricep reflexes are 1+ on the right side and 1+ on the left side.      Bicep reflexes are 1+ on the right side and 1+ on the left side.      Brachioradialis reflexes are 1+ on the right side and 1+ on the left side.      Patellar reflexes are 1+ on the right side and 1+ on the left side.      Achilles reflexes are 1+ on the right side and 1+ on the left side. Mild weakness in her RUE and RLE  Skin: Skin is warm and dry. No rash noted. She is not diaphoretic. No erythema. No pallor.  Psychiatric: She has a normal mood and affect. Her behavior is normal. Judgment and thought content normal.    Lab Results  Component Value Date   WBC 6.8  02/23/2015   HGB 13.8 02/23/2015   HCT 43.1 02/23/2015   PLT 283 02/23/2015   GLUCOSE 99 02/28/2015   CHOL 175 02/06/2015   TRIG 117 02/06/2015   HDL 51 02/06/2015   LDLCALC 101* 02/06/2015   ALT 29 02/23/2015   AST 26 02/23/2015   NA 139 02/28/2015   K 4.4 02/28/2015   CL 103 02/28/2015   CREATININE 0.75 02/28/2015   BUN 10 02/28/2015   CO2 31 02/28/2015   TSH 0.278* 02/06/2015   INR 1.06 02/05/2015   HGBA1C 6.2* 02/06/2015    No results found.  Assessment & Plan:   Shandrika was seen today for  cerebrovascular accident and constipation.  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  Nontraumatic cortical hemorrhage of cerebral hemisphere Orders: -     Ambulatory referral to Occupational Therapy -     Ambulatory referral to Physical Therapy  Cerebellar hemorrhage, nontraumatic- some improvement noted, will send referrals for out pt OT and PT Orders: -     Ambulatory referral to Occupational Therapy -     Ambulatory referral to Physical Therapy  I have discontinued Ms. Brue's polyethylene glycol. I am also having her start on Linaclotide. Additionally, I am having her maintain her latanoprost, levalbuterol, SIMBRINZA, acetaminophen, atorvastatin, diltiazem, pantoprazole, and metoprolol tartrate.  Meds ordered this encounter  Medications  . Linaclotide (LINZESS) 145 MCG CAPS capsule    Sig: Take 1 capsule (145 mcg total) by mouth daily.    Dispense:  90 capsule    Refill:  3     Follow-up: Return in about 4 months (around 07/11/2015).  [name],  MD

## 2015-03-10 NOTE — Telephone Encounter (Signed)
Agree with timing of cardizem and metoprolol as outlined; ok for med prescribed by primary care. Kirk Ruths

## 2015-03-10 NOTE — Progress Notes (Signed)
Pre visit review using our clinic review tool, if applicable. No additional management support is needed unless otherwise documented below in the visit note.

## 2015-03-10 NOTE — Patient Instructions (Signed)

## 2015-03-10 NOTE — Telephone Encounter (Signed)
[name] called in wanting to speak with a nurse about some current medications the pt is currently on. She says that Dr. Stanford Breed put her on Metoprolol and Cardizem which caused her to be sick( feeling sluggish and going to the bathroom often). She went to the PCP and he prescribed Linzess to help but the daughter was unsure if this was safe to take. Please advise  Thanks

## 2015-03-10 NOTE — Telephone Encounter (Signed)
Spoke to daughter  She states her mother blood pressure is till low in 77's ,but patient has been nausea and vomiting.Daughter states therapist instructed not give  cardizem and metoprolol together. She wanted to know what should be th timeframe in between medications. RN suggest taking metoprolol in morning around 8 am and cardizem between 12 and 2 pm if needed can take metoprolol at bedtime. Daughter states  patient saw primary today Dr Ronnald Ramp . He prescribed linzess ( has to taken before morning medications) for the patient. Daughter wanted to know if this was a good idea to add another medication to what patient is already taking. Daughter states patient is having small soft amount bowel movement.  Daughter aware will defer to Dr Stanford Breed and pharmacist and contact her

## 2015-03-11 ENCOUNTER — Other Ambulatory Visit (INDEPENDENT_AMBULATORY_CARE_PROVIDER_SITE_OTHER): Payer: Medicare Other | Admitting: *Deleted

## 2015-03-11 ENCOUNTER — Ambulatory Visit (INDEPENDENT_AMBULATORY_CARE_PROVIDER_SITE_OTHER): Payer: Medicare Other

## 2015-03-11 DIAGNOSIS — I481 Persistent atrial fibrillation: Secondary | ICD-10-CM

## 2015-03-11 DIAGNOSIS — I10 Essential (primary) hypertension: Secondary | ICD-10-CM | POA: Diagnosis not present

## 2015-03-11 DIAGNOSIS — I4819 Other persistent atrial fibrillation: Secondary | ICD-10-CM

## 2015-03-11 DIAGNOSIS — J449 Chronic obstructive pulmonary disease, unspecified: Secondary | ICD-10-CM | POA: Diagnosis not present

## 2015-03-11 DIAGNOSIS — H538 Other visual disturbances: Secondary | ICD-10-CM | POA: Diagnosis not present

## 2015-03-11 DIAGNOSIS — I4891 Unspecified atrial fibrillation: Secondary | ICD-10-CM | POA: Diagnosis not present

## 2015-03-11 DIAGNOSIS — I5030 Unspecified diastolic (congestive) heart failure: Secondary | ICD-10-CM | POA: Diagnosis not present

## 2015-03-11 DIAGNOSIS — I69098 Other sequelae following nontraumatic subarachnoid hemorrhage: Secondary | ICD-10-CM | POA: Diagnosis not present

## 2015-03-11 LAB — TSH: TSH: 1.15 u[IU]/mL (ref 0.35–4.50)

## 2015-03-11 NOTE — Telephone Encounter (Signed)
Would recheck BP; continue cardizem; hold metoprolol; follow HR. Kirk Ruths

## 2015-03-11 NOTE — Telephone Encounter (Signed)
Spoke to [facility]. Instruction given to daugther. [name] was not available. She verbalized understanding. RN PLACED NOTE ON MEDICATION LIST  BLOOD PRESSURE RECHECKED _0 :30 ---78 SBP   KEEP APPOINTMENT PLACING MONITOR ON TODAY. DAUGHTER AWARE.

## 2015-03-11 NOTE — Telephone Encounter (Signed)
Received call from [facility] [facility])  She reporting patient blood pressure at present is 77/65 after therapy ,prior it was 71/59 Patient did received morning dose of metoprolol this morning . Blood pressure at that time was 118/71. Patient is not symptomatic per therapist. Will defer to Dr Stanford Breed, and contact daughter?  Patient is schedule for monitor placement today at the [address] office.

## 2015-03-11 NOTE — Telephone Encounter (Signed)
Forward to [employer]

## 2015-03-14 DIAGNOSIS — I5030 Unspecified diastolic (congestive) heart failure: Secondary | ICD-10-CM | POA: Diagnosis not present

## 2015-03-14 DIAGNOSIS — H538 Other visual disturbances: Secondary | ICD-10-CM | POA: Diagnosis not present

## 2015-03-14 DIAGNOSIS — I69098 Other sequelae following nontraumatic subarachnoid hemorrhage: Secondary | ICD-10-CM | POA: Diagnosis not present

## 2015-03-14 DIAGNOSIS — I4891 Unspecified atrial fibrillation: Secondary | ICD-10-CM | POA: Diagnosis not present

## 2015-03-14 DIAGNOSIS — I10 Essential (primary) hypertension: Secondary | ICD-10-CM | POA: Diagnosis not present

## 2015-03-14 DIAGNOSIS — J449 Chronic obstructive pulmonary disease, unspecified: Secondary | ICD-10-CM | POA: Diagnosis not present

## 2015-03-15 DIAGNOSIS — J449 Chronic obstructive pulmonary disease, unspecified: Secondary | ICD-10-CM | POA: Diagnosis not present

## 2015-03-15 DIAGNOSIS — I4891 Unspecified atrial fibrillation: Secondary | ICD-10-CM | POA: Diagnosis not present

## 2015-03-15 DIAGNOSIS — I10 Essential (primary) hypertension: Secondary | ICD-10-CM | POA: Diagnosis not present

## 2015-03-15 DIAGNOSIS — I69098 Other sequelae following nontraumatic subarachnoid hemorrhage: Secondary | ICD-10-CM | POA: Diagnosis not present

## 2015-03-15 DIAGNOSIS — H538 Other visual disturbances: Secondary | ICD-10-CM | POA: Diagnosis not present

## 2015-03-15 DIAGNOSIS — I5030 Unspecified diastolic (congestive) heart failure: Secondary | ICD-10-CM | POA: Diagnosis not present

## 2015-03-16 ENCOUNTER — Telehealth: Payer: Self-pay | Admitting: *Deleted

## 2015-03-16 DIAGNOSIS — J449 Chronic obstructive pulmonary disease, unspecified: Secondary | ICD-10-CM | POA: Diagnosis not present

## 2015-03-16 DIAGNOSIS — I10 Essential (primary) hypertension: Secondary | ICD-10-CM | POA: Diagnosis not present

## 2015-03-16 DIAGNOSIS — H538 Other visual disturbances: Secondary | ICD-10-CM | POA: Diagnosis not present

## 2015-03-16 DIAGNOSIS — I4891 Unspecified atrial fibrillation: Secondary | ICD-10-CM | POA: Diagnosis not present

## 2015-03-16 DIAGNOSIS — I5030 Unspecified diastolic (congestive) heart failure: Secondary | ICD-10-CM | POA: Diagnosis not present

## 2015-03-16 DIAGNOSIS — I69098 Other sequelae following nontraumatic subarachnoid hemorrhage: Secondary | ICD-10-CM | POA: Diagnosis not present

## 2015-03-16 MED ORDER — DIGOXIN 125 MCG PO TABS: ORAL_TABLET | ORAL | Status: DC

## 2015-03-16 NOTE — Telephone Encounter (Signed)
-----  Message from [name], MD sent at 03/16/2015  1:20 PM EDT ----- Afib; rate elevated; add digoxin 0.25 mg po x1 and repeat 6 hours later and then 0.125 mg daily Kirk Ruths

## 2015-03-16 NOTE — Telephone Encounter (Signed)
Holter results called to patient and instructed to start digoxin 0.138m daily.  (take 2 tablets twice a day on the first day 6 hours apart, then 1 tablet daily).  Patient voiced understanding.

## 2015-03-17 DIAGNOSIS — J449 Chronic obstructive pulmonary disease, unspecified: Secondary | ICD-10-CM | POA: Diagnosis not present

## 2015-03-17 DIAGNOSIS — I10 Essential (primary) hypertension: Secondary | ICD-10-CM | POA: Diagnosis not present

## 2015-03-17 DIAGNOSIS — I5030 Unspecified diastolic (congestive) heart failure: Secondary | ICD-10-CM | POA: Diagnosis not present

## 2015-03-17 DIAGNOSIS — H538 Other visual disturbances: Secondary | ICD-10-CM | POA: Diagnosis not present

## 2015-03-17 DIAGNOSIS — I69098 Other sequelae following nontraumatic subarachnoid hemorrhage: Secondary | ICD-10-CM | POA: Diagnosis not present

## 2015-03-17 DIAGNOSIS — I4891 Unspecified atrial fibrillation: Secondary | ICD-10-CM | POA: Diagnosis not present

## 2015-03-21 DIAGNOSIS — I5030 Unspecified diastolic (congestive) heart failure: Secondary | ICD-10-CM | POA: Diagnosis not present

## 2015-03-21 DIAGNOSIS — I69098 Other sequelae following nontraumatic subarachnoid hemorrhage: Secondary | ICD-10-CM | POA: Diagnosis not present

## 2015-03-21 DIAGNOSIS — I10 Essential (primary) hypertension: Secondary | ICD-10-CM | POA: Diagnosis not present

## 2015-03-21 DIAGNOSIS — I4891 Unspecified atrial fibrillation: Secondary | ICD-10-CM | POA: Diagnosis not present

## 2015-03-21 DIAGNOSIS — H538 Other visual disturbances: Secondary | ICD-10-CM | POA: Diagnosis not present

## 2015-03-21 DIAGNOSIS — J449 Chronic obstructive pulmonary disease, unspecified: Secondary | ICD-10-CM | POA: Diagnosis not present

## 2015-03-22 DIAGNOSIS — J449 Chronic obstructive pulmonary disease, unspecified: Secondary | ICD-10-CM | POA: Diagnosis not present

## 2015-03-22 DIAGNOSIS — I5030 Unspecified diastolic (congestive) heart failure: Secondary | ICD-10-CM | POA: Diagnosis not present

## 2015-03-22 DIAGNOSIS — I10 Essential (primary) hypertension: Secondary | ICD-10-CM | POA: Diagnosis not present

## 2015-03-22 DIAGNOSIS — I69098 Other sequelae following nontraumatic subarachnoid hemorrhage: Secondary | ICD-10-CM | POA: Diagnosis not present

## 2015-03-22 DIAGNOSIS — H538 Other visual disturbances: Secondary | ICD-10-CM | POA: Diagnosis not present

## 2015-03-22 DIAGNOSIS — I4891 Unspecified atrial fibrillation: Secondary | ICD-10-CM | POA: Diagnosis not present

## 2015-03-23 ENCOUNTER — Ambulatory Visit: Payer: Medicare Other | Attending: Internal Medicine | Admitting: Occupational Therapy

## 2015-03-23 DIAGNOSIS — I69898 Other sequelae of other cerebrovascular disease: Secondary | ICD-10-CM | POA: Insufficient documentation

## 2015-03-23 DIAGNOSIS — R269 Unspecified abnormalities of gait and mobility: Secondary | ICD-10-CM

## 2015-03-23 DIAGNOSIS — R6889 Other general symptoms and signs: Secondary | ICD-10-CM | POA: Diagnosis not present

## 2015-03-23 DIAGNOSIS — R531 Weakness: Secondary | ICD-10-CM | POA: Insufficient documentation

## 2015-03-23 DIAGNOSIS — M6281 Muscle weakness (generalized): Secondary | ICD-10-CM | POA: Insufficient documentation

## 2015-03-23 DIAGNOSIS — R278 Other lack of coordination: Secondary | ICD-10-CM

## 2015-03-23 DIAGNOSIS — IMO0002 Reserved for concepts with insufficient information to code with codable children: Secondary | ICD-10-CM

## 2015-03-23 DIAGNOSIS — R279 Unspecified lack of coordination: Secondary | ICD-10-CM | POA: Insufficient documentation

## 2015-03-23 NOTE — Therapy (Signed)
[facility] [address] [place], [place], [address] Phone: [phone]   Fax:  [phone]  Occupational Therapy Evaluation  Patient Details  Name: [name] MRN: [id number] Date of Birth: 1935-05-04 Referring Provider:  Janith Lima, MD  Encounter Date: 03/23/2015      OT End of Session - 03/23/15 1647    Visit Number 1   Number of Visits 17   Date for OT Re-Evaluation 05/22/15   Authorization Type Medicare   Authorization - Visit Number 1   Authorization - Number of Visits 10   OT Start Time 4665   OT Stop Time 1400   OT Time Calculation (min) 43 min   Activity Tolerance Patient tolerated treatment well   Behavior During Therapy [facility] for tasks assessed/performed      Past Medical History  Diagnosis Date  . History of CHF (congestive heart failure)     POST SURG 2010  . COPD (chronic obstructive pulmonary disease)   . History of breast cancer     DX DCIS IN 2004--  S/P RIGHT MASTECTOMY , NO CHEMORADIATION---  NO RECURRENCE  . Glaucoma of both eyes   . History of atrial fibrillation without current medication 2010    POST SURG 2010--  PER PT NO ISSUES SINCE  . Arthritis   . Dyspnea on exertion   . GERD (gastroesophageal reflux disease)   . Nephrolithiasis   . History of shingles     02/ 2015--  back of neck and left flank--  no residual pain  . History of hypertension   . Recurrent bladder papillary carcinoma first dx 06/ 2014    s/p  turbt's  and instillation mitomycin c (chemo)  . ICH (intracerebral hemorrhage) 02/05/2015    Past Surgical History  Procedure Laterality Date  . Total knee arthroplasty Right 03-22-2009  . Partial mastectomy with needle localization Right 01-14-2003    DCIS  . Total mastectomy Right 02-03-2003    W/ SLN BX  AND  POST 03-01-2003 EVACUATION HEMATOMA  . Removal cyst left hand  2013  . Transthoracic echocardiogram  03-25-2009    MILD LVF/ EF 70-80%/ MILD  INCREASE SYSTOLIC PULMONARY  PRESSURE  . Transurethral resection of bladder tumor with gyrus (turbt-gyrus) N/A 03/26/2013    Procedure: TRANSURETHRAL RESECTION OF BLADDER TUMOR WITH GYRUS (TURBT-GYRUS);  Surgeon: Alexis Frock, MD;  Location: [facility];  Service: Urology;  Laterality: N/A;  . Cystoscopy w/ ureteral stent placement Bilateral 03/26/2013    Procedure: CYSTOSCOPY WITH BILATERAL RETROGRADE PYELOGRAM/URETERAL STENT PLACEMENT;  Surgeon: Alexis Frock, MD;  Location: [facility];  Service: Urology;  Laterality: Bilateral;  . Transurethral resection of bladder tumor with gyrus (turbt-gyrus) N/A 05/13/2013    Procedure: TRANSURETHRAL RESECTION OF BLADDER TUMOR WITH GYRUS (TURBT-GYRUS)  RE-STAGING TRANSURETHRAL RESECTION OF BLADDER TUMOR, LEFT RETROGRADE PYELOGRAM AND STENT EXCHANGE;  Surgeon: Alexis Frock, MD;  Location: [facility];  Service: Urology;  Laterality: N/A;  . Cystoscopy w/ ureteral stent placement Left 05/13/2013    Procedure: CYSTOSCOPY WITH RETROGRADE PYELOGRAM/URETERAL STENT PLACEMENT STENT EXCHANGE;  Surgeon: Alexis Frock, MD;  Location: [facility];  Service: Urology;  Laterality: Left;  . Tubal ligation    . Cataract extraction w/ intraocular lens  implant, bilateral    . Transurethral resection of bladder tumor with gyrus (turbt-gyrus) N/A 08/11/2014    Procedure: TRANSURETHRAL RESECTION OF BLADDER TUMOR WITH GYRUS (TURBT-GYRUS);  Surgeon: Alexis Frock, MD;  Location: [facility];  Service: Urology;  Laterality: N/A;  . Cystoscopy w/ retrogrades Bilateral 08/11/2014    Procedure: CYSTOSCOPY WITH BILATERAL RETROGRADE PYELOGRAM AND MITOMYCIN INSTILLATION;  Surgeon: Alexis Frock, MD;  Location: [facility];  Service: Urology;  Laterality: Bilateral;  . Craniectomy N/A 02/05/2015    Procedure: SUBOCCIPITAL CRANIECTOMY EVACUATION OF HEMATOMA;  Surgeon: Consuella Lose, MD;  Location:  [facility];  Service: Neurosurgery;  Laterality: N/A;    There were no vitals filed for this visit.  Visit Diagnosis:  Weakness due to cerebrovascular accident - Plan: Ot plan of care cert/re-cert  Decreased coordination - Plan: Ot plan of care cert/re-cert  Abnormality of gait - Plan: Ot plan of care cert/re-cert  Decreased functional activity tolerance - Plan: Ot plan of care cert/re-cert      Subjective Assessment - 03/23/15 1637    Subjective  Pt presents s/p right cerebellar hemorrhage s/p craniotomy evacuation of hematoma 02/05/15 with decreased strength, coordination and balance   Patient is accompained by: Family member   Pertinent History see EPIC snap shot   Patient Stated Goals to get back to normal   Currently in Pain? No/denies   Multiple Pain Sites No           [facility] OT Assessment - 03/23/15 0001    Assessment   Diagnosis CVA   Onset Date 02/05/15   Assessment Pt was admitted with nausea/ vomiting and blurred vision.  CT right cerebellar hematoma.   Prior Therapy HHOT ended yesterday   Precautions   Precautions Fall   Balance Screen   Has the patient fallen in the past 6 months No   Has the patient had a decrease in activity level because of a fear of falling?  No   Is the patient reluctant to leave their home because of a fear of falling?  No   Home  Environment   Family/patient expects to be discharged to: Private residence   Available Help at [place]  2 steps   [place] Two level   Lives With Spouse  daughter is staying with her.   Prior Function   Level of Independence Independent   ADL   Eating/Feeding Needs assist with cutting food   Grooming Minimal assistance  with styling hair   Upper Body Bathing Supervision/safety  sitting on shower seat   Lower Body Bathing Supervision/safety   Upper Body Dressing Supervision/safety   Lower Body Dressing Supervision/safety   Toilet Tranfer Supervision/safety  with  walker   Tub/Shower Transfer Supervision/safety   IADL   Shopping Needs to be accompanied on any shopping trip   Light Housekeeping Needs help with all home maintenance tasks  has not attempted   Meal Prep --  has not attempted, prefers not to cook   Financial Management Requires assistance   Mobility   Mobility Status Comments supervision  with rolling walker   Written Expression   Dominant Hand Right   Handwriting 75% legible  for a simple sentence   Vision - History   Baseline Vision Bifocals   Vision Assessment   Vision Assessment --  To be assessed further in a functional context,    Comment Pt reports visual changes initally, however pt reports visual changes have resolved   Activity Tolerance   Activity Tolerance --  tolerates grossly 30 mins prior to rest break   Cognition   Overall Cognitive Status --  Possible motor planning deficit   Mini Mental State  Exam  spells world backwards, oriented to weekday, month, year, not date   Observation/Other Assessments   Standing Functional Reach Test RUE 8 inches, LUE 6 inches   Coordination   Gross Motor Movements are Fluid and Coordinated No   Fine Motor Movements are Fluid and Coordinated No   9 Hole Peg Test Right;Left   Right 9 Hole Peg Test 39.97 secs   Left 9 Hole Peg Test 41.34 secs   Hand Function   Right Hand Gross Grasp Functional   Right Hand Grip (lbs) 35 lbs   Left Hand Grip (lbs) 33 lbs     FOTO: Pt did not complete today, therapist to have pt complete tomorrow for PT eval.                      OT Short Term Goals - 03/23/15 1655    OT SHORT TERM GOAL #1   Title I with HEP.   Baseline check 8/19/6   Time 4   Period Weeks   Status New   OT SHORT TERM GOAL #2   Title Pt will demonstrate ability to write a short paragraph with 95% or better accuracy, and minimal decrease in letter size.   Time 4   Period Weeks   Status New   OT SHORT TERM GOAL #3   Title Pt will perform all basic  ADLs modified independently   Time 4   Period Weeks   Status New   OT SHORT TERM GOAL #4   Title Pt will improve RUE fine motor coordination as evidenced by performing 9 hole peg test in 35 secs or less.   Time 4   Period Weeks   Status New           OT Long Term Goals - 03/23/15 1702    OT LONG TERM GOAL #1   Title Pt will perform light home management and basic snack prep modified independently demonstrating good safety awareness.   Baseline due 05/22/15   Time 8   Period Weeks   Status New   OT LONG TERM GOAL #2   Title Pt will improve bilateral functional reach score to 10 inches or better, for improved functional reach/ standing balance.   Time 8   Period Weeks   Status New   OT LONG TERM GOAL #3   Title Pt will report increased activity tolerance as evidenced by pt report that she is able to stand for home management/ ADLs for 45 mins-1 hour prior to rest break.   Time 8   Period Weeks   Status New               Plan - 03/23/15 1652    Clinical Impression Statement Pt s/p right cerebellar hemorrhage s/p craniotomy evacuation of hematoma presents with decreased strength, decreased coordination, decreased balance, and decreased activity tolerance which impedes performance of ADLs/ IADls.   Pt will benefit from skilled therapeutic intervention in order to improve on the following deficits (Retired) Abnormal gait;Decreased coordination;Decreased range of motion;Impaired flexibility;Decreased endurance;Decreased safety awareness;Decreased activity tolerance;Decreased balance;Impaired UE functional use;Decreased cognition;Decreased mobility;Decreased strength;Impaired vision/preception   Rehab Potential Good   OT Frequency 2x / week  plus eval   OT Duration 8 weeks   OT Treatment/Interventions Self-care/ADL training;Moist Heat;Fluidtherapy;DME and/or AE instruction;Splinting;Patient/family education;Balance training;Therapeutic exercises;Contrast  Bath;Ultrasound;Therapeutic exercise;Therapeutic activities;Cognitive remediation/compensation;Passive range of motion;Functional Mobility Training;Neuromuscular education;Electrical Stimulation;Energy conservation;Manual Therapy;Visual/perceptual remediation/compensation   Plan HEP for coordination, UE strength   Consulted and Agree  with Plan of Care Patient;Family member/caregiver   Family Member Consulted daughter          G-Codes - 03/27/15 1715    Functional Assessment Tool Used 9 hole peg test RUE 39.97 secs, LUE 41.34 secs, standing functional reach test RUE 8 inches, with insecurity, LUE 6 inches, is not performing home management tasks yet   Functional Limitation Self care   Self Care Current Status [phone]) At least 20 percent but less than 40 percent impaired, limited or restricted   Self Care Goal Status (E3212) At least 1 percent but less than 20 percent impaired, limited or restricted      Problem List Patient Active Problem List   Diagnosis Date Noted  . Low TSH level 03/10/2015  . Other constipation 03/10/2015  . Constipation 03/08/2015  . Possible Bradycardia 02/22/2015  . Cerebellar hemorrhage, nontraumatic 02/22/2015  . Benign essential HTN 02/21/2015  . 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 Dizziness   . Hypokalemia   . Chronic combined systolic and diastolic congestive heart failure   . Atrial fibrillation with RVR   . HLD (hyperlipidemia)   . Obstructive hydrocephalus   . ICH (intracerebral hemorrhage) 02/05/2015  . COPD bronchitis 12/30/2014  . GERD (gastroesophageal reflux disease) 12/30/2014  . Bladder cancer 01/01/2014  . Glaucoma 01/01/2014  . DCIS (ductal carcinoma in situ) of breast 01/01/2014    RINE,KATHRYN 03-27-2015, 5:20 PM Theone Murdoch, OTR/L [phone] Phone: [phone] 5:20 PM 03-27-15 [facility] [address] [place], [place], [address] Phone:  [phone]   Fax:  [phone]

## 2015-03-24 ENCOUNTER — Ambulatory Visit: Payer: Medicare Other | Admitting: Rehabilitative and Restorative Service Providers"

## 2015-03-24 DIAGNOSIS — R269 Unspecified abnormalities of gait and mobility: Secondary | ICD-10-CM | POA: Diagnosis not present

## 2015-03-24 DIAGNOSIS — IMO0002 Reserved for concepts with insufficient information to code with codable children: Secondary | ICD-10-CM

## 2015-03-24 DIAGNOSIS — R6889 Other general symptoms and signs: Secondary | ICD-10-CM | POA: Diagnosis not present

## 2015-03-24 DIAGNOSIS — R279 Unspecified lack of coordination: Secondary | ICD-10-CM | POA: Diagnosis not present

## 2015-03-24 DIAGNOSIS — M6281 Muscle weakness (generalized): Secondary | ICD-10-CM | POA: Diagnosis not present

## 2015-03-24 DIAGNOSIS — R531 Weakness: Secondary | ICD-10-CM | POA: Diagnosis not present

## 2015-03-24 DIAGNOSIS — I69898 Other sequelae of other cerebrovascular disease: Secondary | ICD-10-CM | POA: Diagnosis not present

## 2015-03-24 NOTE — Therapy (Signed)
[facility] [address] [place], [place], [address] Phone: [phone]   Fax:  [phone]  Physical Therapy Evaluation  Patient Details  Name: [name] MRN: [id number] Date of Birth: July 24, 1935 Referring Provider:  Janith Lima, MD  Encounter Date: 03/24/2015      PT End of Session - 03/24/15 1203    Visit Number 1   Number of Visits 16   Date for PT Re-Evaluation 05/25/15   Authorization Type G code every 10th visit   PT Start Time 1104   PT Stop Time 1150   PT Time Calculation (min) 46 min   Equipment Utilized During Treatment Gait belt   Activity Tolerance Patient tolerated treatment well;Patient limited by fatigue   Behavior During Therapy [facility] for tasks assessed/performed      Past Medical History  Diagnosis Date  . History of CHF (congestive heart failure)     POST SURG 2010  . COPD (chronic obstructive pulmonary disease)   . History of breast cancer     DX DCIS IN 2004--  S/P RIGHT MASTECTOMY , NO CHEMORADIATION---  NO RECURRENCE  . Glaucoma of both eyes   . History of atrial fibrillation without current medication 2010    POST SURG 2010--  PER PT NO ISSUES SINCE  . Arthritis   . Dyspnea on exertion   . GERD (gastroesophageal reflux disease)   . Nephrolithiasis   . History of shingles     02/ 2015--  back of neck and left flank--  no residual pain  . History of hypertension   . Recurrent bladder papillary carcinoma first dx 06/ 2014    s/p  turbt's  and instillation mitomycin c (chemo)  . ICH (intracerebral hemorrhage) 02/05/2015    Past Surgical History  Procedure Laterality Date  . Total knee arthroplasty Right 03-22-2009  . Partial mastectomy with needle localization Right 01-14-2003    DCIS  . Total mastectomy Right 02-03-2003    W/ SLN BX  AND  POST 03-01-2003 EVACUATION HEMATOMA  . Removal cyst left hand  2013  . Transthoracic echocardiogram  03-25-2009    MILD LVF/ EF  70-80%/ MILD INCREASE SYSTOLIC PULMONARY  PRESSURE  . Transurethral resection of bladder tumor with gyrus (turbt-gyrus) N/A 03/26/2013    Procedure: TRANSURETHRAL RESECTION OF BLADDER TUMOR WITH GYRUS (TURBT-GYRUS);  Surgeon: Alexis Frock, MD;  Location: [facility];  Service: Urology;  Laterality: N/A;  . Cystoscopy w/ ureteral stent placement Bilateral 03/26/2013    Procedure: CYSTOSCOPY WITH BILATERAL RETROGRADE PYELOGRAM/URETERAL STENT PLACEMENT;  Surgeon: Alexis Frock, MD;  Location: [facility];  Service: Urology;  Laterality: Bilateral;  . Transurethral resection of bladder tumor with gyrus (turbt-gyrus) N/A 05/13/2013    Procedure: TRANSURETHRAL RESECTION OF BLADDER TUMOR WITH GYRUS (TURBT-GYRUS)  RE-STAGING TRANSURETHRAL RESECTION OF BLADDER TUMOR, LEFT RETROGRADE PYELOGRAM AND STENT EXCHANGE;  Surgeon: Alexis Frock, MD;  Location: [facility];  Service: Urology;  Laterality: N/A;  . Cystoscopy w/ ureteral stent placement Left 05/13/2013    Procedure: CYSTOSCOPY WITH RETROGRADE PYELOGRAM/URETERAL STENT PLACEMENT STENT EXCHANGE;  Surgeon: Alexis Frock, MD;  Location: [facility];  Service: Urology;  Laterality: Left;  . Tubal ligation    . Cataract extraction w/ intraocular lens  implant, bilateral    . Transurethral resection of bladder tumor with gyrus (turbt-gyrus) N/A 08/11/2014    Procedure: TRANSURETHRAL RESECTION OF BLADDER TUMOR WITH GYRUS (TURBT-GYRUS);  Surgeon: Alexis Frock, MD;  Location: [facility];  Service: Urology;  Laterality: N/A;  . Cystoscopy w/ retrogrades Bilateral 08/11/2014    Procedure: CYSTOSCOPY WITH BILATERAL RETROGRADE PYELOGRAM AND MITOMYCIN INSTILLATION;  Surgeon: Alexis Frock, MD;  Location: [facility];  Service: Urology;  Laterality: Bilateral;  . Craniectomy N/A 02/05/2015    Procedure: SUBOCCIPITAL CRANIECTOMY EVACUATION OF HEMATOMA;  Surgeon: Consuella Lose,  MD;  Location: [facility];  Service: Neurosurgery;  Laterality: N/A;    There were no vitals filed for this visit.  Visit Diagnosis:  Abnormality of gait  Weakness due to cerebrovascular accident  Decreased functional activity tolerance      Subjective Assessment - 03/24/15 1109    Subjective The patient is s/p CVA 02/05/2015.  She received HH therapy and d/c this week.  She is transitioning to OP rehab with goals to improve overall mobility.     Pertinent History scoliosis., R hip higher per patient   Patient Stated Goals Return to prior level of function (cane at night due to spinal curve), no device during the day   Currently in Pain? No/denies            [facility] PT Assessment - 03/24/15 1057    Assessment   Medical Diagnosis CVA- hemorrhage   Onset Date/Surgical Date 02/05/15   Prior Therapy acute and HH    Precautions   Precautions Fall   Balance Screen   Has the patient fallen in the past 6 months No   Has the patient had a decrease in activity level because of a fear of falling?  No   Is the patient reluctant to leave their home because of a fear of falling?  No   Home Environment   Living Environment Private residence   Living Arrangements Spouse/significant other  daughter also assisting; other children available to help   Available Help at Discharge Family   Type of [place] to enter   Entrance Stairs-Number of Steps 2   Entrance Stairs-Rails Cannot reach both  holds trim/and solid door   Home Layout Two level;Able to live on main level with bedroom/bathroom   [place] - 2 wheels;Bedside commode;Cane - single point   Prior Function   Level of Independence Independent   Observation/Other Assessments   Focus on Therapeutic Outcomes (FOTO)  53%   Other Surveys  --  SIS mobility 60.1%   ROM / Strength   AROM / PROM / Strength AROM;Strength   AROM   Overall AROM  Within functional limits for tasks performed   Strength    Overall Strength Comments L hip flex 4/5, R hip flex 3/5, L knee ext 4/5 (pain), L knee flex 5/5, R knee ext 5/5, R knee flex 4/5, L ankle DF 5/5, R ankle DF 3+/5   Bed Mobility   Bed Mobility --  supervision level per daughter   Transfers   Transfers Sit to Stand   Ambulation/Gait   Ambulation/Gait Yes   Ambulation/Gait Assistance 5: Supervision   Ambulation Distance (Feet) --  150 feet   Assistive device Rolling walker   Gait Pattern --  Decreased R stance phase   Ambulation Surface Level   Gait velocity 1.99 ft/sec   Stairs Yes   Stairs Assistance 4: Min guard   Stair Management Technique One rail Right;Alternating pattern   Number of Stairs --  4   Gait Comments Patient spO2=85%, improves to 95% within 3 minutes   Standardized Balance Assessment   Standardized Balance  Assessment Berg Balance Test   Berg Balance Test   Sit to Stand Able to stand  independently using hands   Standing Unsupported Able to stand safely 2 minutes   Sitting with Back Unsupported but Feet Supported on Floor or Stool Able to sit safely and securely 2 minutes   Stand to Sit Controls descent by using hands   Transfers Able to transfer safely, definite need of hands   Standing Unsupported with Eyes Closed Able to stand 10 seconds with supervision   Standing Ubsupported with Feet Together Able to place feet together independently and stand for 1 minute with supervision   From Standing, Reach Forward with Outstretched Arm Can reach forward >12 cm safely (5")   From Standing Position, Pick up Object from Floor Able to pick up shoe, needs supervision   From Standing Position, Turn to Look Behind Over each Shoulder Needs supervision when turning   Turn 360 Degrees Needs close supervision or verbal cueing   Standing Unsupported, Alternately Place Feet on Step/Stool Able to complete >2 steps/needs minimal assist   Standing Unsupported, One Foot in Front Able to take small step independently and hold 30 seconds    Standing on One Leg Unable to try or needs assist to prevent fall   Total Score 34            PT Short Term Goals - 03/24/15 1203    PT SHORT TERM GOAL #1   Title Patient will return demo HEP for LE strength, balance and endurance tasks.   Baseline Target date 04/24/2015   Time 4   Period Weeks   PT SHORT TERM GOAL #2   Title Improve Berg from 34/56 up to 38/56 to demo decreased risk for falls.   Baseline Target date 04/24/2015   Time 4   Period Weeks   PT SHORT TERM GOAL #3   Title Improve gait speed from 1.99 ft/sec up to 2.4 ft/sec to demo improving functional mobility.   Baseline Target date 04/24/2015   Time 4   Period Weeks   PT SHORT TERM GOAL #4   Title Tolerate ambulation x 5 minutes nonstop with least restrictive assistive device for improved household and limited community mobility.   Baseline Target date 04/24/2015   Time 4   Period Weeks   PT SHORT TERM GOAL #5   Title Patient will ambulate without a device x 200 ft with close supervision for improved household mobility.   Baseline Target date 04/24/2015   Time 4   Period Weeks           PT Long Term Goals - 03/24/15 1202    PT LONG TERM GOAL #1   Title Verbalize understanding of stroke risk factors and warning signs.   Baseline Target date 05/25/2015   Time 8   Period Weeks   PT LONG TERM GOAL #2   Title Improve Berg from 34/56 upt o 44/56 to demo decreasing risk for falls.   Baseline Target date 05/25/2015   Time 8   Period Weeks   PT LONG TERM GOAL #3   Title Improve gait speed from 1.99 ft/sec up to 2.62 ft/sec to demo transition to limited community ambulator classification of gait.   Baseline Target date 05/25/2015   Time 8   Period Weeks   PT LONG TERM GOAL #4   Title Negotiate 4 steps x 4 reps (to make flight of steps) modified indep with one handrail (baseline=CGA).   Baseline Target date 05/25/2015  Time 8   Period Weeks   PT LONG TERM GOAL #5   Title Improve SIS mobility from 60.1% up  to 75% to demo improved functional mobility.   Baseline Target date 05/25/2015   Time 8   Period Weeks               Plan - 2015/04/12 1210    Clinical Impression Statement The patient is a 80yo female s/p CVA on 02/05/2015 undergoing evacuation of hematoma.  She underwent IP rehab stay, then home health therapy and now transitions to outpatient therapy.     Pt will benefit from skilled therapeutic intervention in order to improve on the following deficits Abnormal gait;Decreased balance;Decreased activity tolerance;Decreased mobility;Decreased strength;Postural dysfunction;Impaired flexibility;Decreased endurance;Difficulty walking   Rehab Potential Good   Clinical Impairments Affecting Rehab Potential h/o COPD with shortness of breath noted   PT Frequency 2x / week   PT Duration 8 weeks   PT Treatment/Interventions Functional mobility training;Stair training;Gait training;Patient/family education;Therapeutic activities;Therapeutic exercise;Balance training;Neuromuscular re-education;ADLs/Self Care Home Management;Energy conservation   PT Next Visit Plan MONITOR O2 level: Establish HEP: Countertop exercises and/or corner balance decreasing dependence on assistive device for balance.   Consulted and Agree with Plan of Care Patient          G-Codes - April 12, 2015 1216    Functional Assessment Tool Used Berg=34/56, gait speed=1.99 ft/sec   Functional Limitation Mobility: Walking and moving around   Mobility: Walking and Moving Around Current Status [phone]) At least 40 percent but less than 60 percent impaired, limited or restricted   Mobility: Walking and Moving Around Goal Status [phone]) At least 1 percent but less than 20 percent impaired, limited or restricted       Problem List Patient Active Problem List   Diagnosis Date Noted  . Low TSH level 03/10/2015  . Other constipation 03/10/2015  . Constipation 03/08/2015  . Possible Bradycardia 02/22/2015  . Cerebellar hemorrhage,  nontraumatic 02/22/2015  . Benign essential HTN 02/21/2015  . 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 Dizziness   . Hypokalemia   . Chronic combined systolic and diastolic congestive heart failure   . Atrial fibrillation with RVR   . HLD (hyperlipidemia)   . Obstructive hydrocephalus   . ICH (intracerebral hemorrhage) 02/05/2015  . COPD bronchitis 12/30/2014  . GERD (gastroesophageal reflux disease) 12/30/2014  . Bladder cancer 01/01/2014  . Glaucoma 01/01/2014  . DCIS (ductal carcinoma in situ) of breast 01/01/2014    Thank you for the referral of this patient.  [place], [place] 04-12-15, 12:18 PM  [facility] [address] [place], [place], [address] Phone: [phone]   Fax:  [phone]

## 2015-03-25 ENCOUNTER — Other Ambulatory Visit: Payer: Self-pay | Admitting: Cardiology

## 2015-03-28 ENCOUNTER — Ambulatory Visit: Payer: Medicare Other | Admitting: Occupational Therapy

## 2015-03-28 ENCOUNTER — Other Ambulatory Visit: Payer: Self-pay | Admitting: Physical Medicine and Rehabilitation

## 2015-03-28 ENCOUNTER — Other Ambulatory Visit: Payer: Self-pay

## 2015-03-28 DIAGNOSIS — R279 Unspecified lack of coordination: Secondary | ICD-10-CM

## 2015-03-28 DIAGNOSIS — M6281 Muscle weakness (generalized): Secondary | ICD-10-CM | POA: Diagnosis not present

## 2015-03-28 DIAGNOSIS — I69898 Other sequelae of other cerebrovascular disease: Secondary | ICD-10-CM | POA: Diagnosis not present

## 2015-03-28 DIAGNOSIS — R531 Weakness: Secondary | ICD-10-CM | POA: Diagnosis not present

## 2015-03-28 DIAGNOSIS — R278 Other lack of coordination: Secondary | ICD-10-CM

## 2015-03-28 DIAGNOSIS — R6889 Other general symptoms and signs: Secondary | ICD-10-CM

## 2015-03-28 DIAGNOSIS — R269 Unspecified abnormalities of gait and mobility: Secondary | ICD-10-CM | POA: Diagnosis not present

## 2015-03-28 DIAGNOSIS — IMO0002 Reserved for concepts with insufficient information to code with codable children: Secondary | ICD-10-CM

## 2015-03-28 MED ORDER — ATORVASTATIN CALCIUM 10 MG PO TABS: 10.0000 mg | ORAL_TABLET | Freq: Every day | ORAL | Status: DC

## 2015-03-28 NOTE — Patient Instructions (Signed)
  Coordination Activities  Perform the following activities for 20 minutes 1-2 times per day with right hand(s).   Rotate ball in fingertips (clockwise and counter-clockwise).  Toss ball between hands.  Flip cards 1 at a time as fast as you can.  Deal cards with your thumb (Hold deck in hand and push card off top with thumb).  Pick up coins and place in container or coin bank.  Pick up coins and stack.  Pick up coins one at a time until you get 5-10 in your hand, then move coins from palm to fingertips to stack one at a time.  Practice writing and/or typing. (Try to keep letters large and well spaced, perform handwriting activities like we practiced in therapy)

## 2015-03-28 NOTE — Therapy (Signed)
[facility] [address] [place], [place], [address] Phone: [phone]   Fax:  [phone]  Occupational Therapy Treatment  Patient Details  Name: [name] MRN: [id number] Date of Birth: [birth year] Referring Provider:  Janith Lima, MD  Encounter Date: 03/28/2015      OT End of Session - 03/28/15 1416    Visit Number 2   Number of Visits 17   Date for OT Re-Evaluation 05/22/15   Authorization Type Medicare   Authorization - Visit Number 2   Authorization - Number of Visits 10   OT Start Time 2751   OT Stop Time 1402   OT Time Calculation (min) 45 min   Activity Tolerance Other (comment)  Pt with low BP and had not eaten much, provided with peanut butter crackers   Behavior During Therapy [facility] for tasks assessed/performed      Past Medical History  Diagnosis Date  . History of CHF (congestive heart failure)     POST SURG 2010  . COPD (chronic obstructive pulmonary disease)   . History of breast cancer     DX DCIS IN 2004--  S/P RIGHT MASTECTOMY , NO CHEMORADIATION---  NO RECURRENCE  . Glaucoma of both eyes   . History of atrial fibrillation without current medication 2010    POST SURG 2010--  PER PT NO ISSUES SINCE  . Arthritis   . Dyspnea on exertion   . GERD (gastroesophageal reflux disease)   . Nephrolithiasis   . History of shingles     02/ 2015--  back of neck and left flank--  no residual pain  . History of hypertension   . Recurrent bladder papillary carcinoma first dx 06/ 2014    s/p  turbt's  and instillation mitomycin c (chemo)  . ICH (intracerebral hemorrhage) 02/05/2015    Past Surgical History  Procedure Laterality Date  . Total knee arthroplasty Right 03-22-2009  . Partial mastectomy with needle localization Right 01-14-2003    DCIS  . Total mastectomy Right 02-03-2003    W/ SLN BX  AND  POST 03-01-2003 EVACUATION HEMATOMA  . Removal cyst left hand  2013  . Transthoracic  echocardiogram  03-25-2009    MILD LVF/ EF 70-80%/ MILD INCREASE SYSTOLIC PULMONARY  PRESSURE  . Transurethral resection of bladder tumor with gyrus (turbt-gyrus) N/A 03/26/2013    Procedure: TRANSURETHRAL RESECTION OF BLADDER TUMOR WITH GYRUS (TURBT-GYRUS);  Surgeon: Alexis Frock, MD;  Location: [facility];  Service: Urology;  Laterality: N/A;  . Cystoscopy w/ ureteral stent placement Bilateral 03/26/2013    Procedure: CYSTOSCOPY WITH BILATERAL RETROGRADE PYELOGRAM/URETERAL STENT PLACEMENT;  Surgeon: Alexis Frock, MD;  Location: [facility];  Service: Urology;  Laterality: Bilateral;  . Transurethral resection of bladder tumor with gyrus (turbt-gyrus) N/A 05/13/2013    Procedure: TRANSURETHRAL RESECTION OF BLADDER TUMOR WITH GYRUS (TURBT-GYRUS)  RE-STAGING TRANSURETHRAL RESECTION OF BLADDER TUMOR, LEFT RETROGRADE PYELOGRAM AND STENT EXCHANGE;  Surgeon: Alexis Frock, MD;  Location: [facility];  Service: Urology;  Laterality: N/A;  . Cystoscopy w/ ureteral stent placement Left 05/13/2013    Procedure: CYSTOSCOPY WITH RETROGRADE PYELOGRAM/URETERAL STENT PLACEMENT STENT EXCHANGE;  Surgeon: Alexis Frock, MD;  Location: [facility];  Service: Urology;  Laterality: Left;  . Tubal ligation    . Cataract extraction w/ intraocular lens  implant, bilateral    . Transurethral resection of bladder tumor with gyrus (turbt-gyrus) N/A 08/11/2014    Procedure: TRANSURETHRAL RESECTION OF BLADDER  TUMOR WITH GYRUS (TURBT-GYRUS);  Surgeon: Alexis Frock, MD;  Location: [facility];  Service: Urology;  Laterality: N/A;  . Cystoscopy w/ retrogrades Bilateral 08/11/2014    Procedure: CYSTOSCOPY WITH BILATERAL RETROGRADE PYELOGRAM AND MITOMYCIN INSTILLATION;  Surgeon: Alexis Frock, MD;  Location: [facility];  Service: Urology;  Laterality: Bilateral;  . Craniectomy N/A 02/05/2015    Procedure: SUBOCCIPITAL CRANIECTOMY EVACUATION  OF HEMATOMA;  Surgeon: Consuella Lose, MD;  Location: [facility];  Service: Neurosurgery;  Laterality: N/A;    There were no vitals filed for this visit.  Visit Diagnosis:  Weakness due to cerebrovascular accident  Decreased functional activity tolerance  Decreased coordination      Subjective Assessment - 03/28/15 1320    Subjective  Pt reports she got out this weekend   Pertinent History see EPIC snap shot   Patient Stated Goals to get back to normal   Currently in Pain? No/denies            [facility] OT Assessment - 03/28/15 0001    Observation/Other Assessments   Focus on Therapeutic Outcomes (FOTO)  53%  performed 03/24/15                  OT Treatments/Exercises (OP) - 03/28/15 0001    Fine Motor Coordination   Fine Motor Coordination Flipping cards;Dealing card with thumb;Picking up coins;Manipulating coins;Stacking coins;Tossing ball;Handwriting Therapist issued as HEP, min v.c. Handwriting activities with emphasis on maintaining spacing and letter size, tracing circles for improved hand eye coordination, min v.c., min-mod difficulty Pt had not eaten anything but yogurt for lunch. BP= 90/55, therapist encouraged pt to drink water and provided with crackers, pt reported she was feeling fine at end of session.                OT Education - 03/28/15 1423    Education provided Yes   Education Details coordination,HEP, handwriting stragtegies   Person(s) Educated Patient;Child(ren)   Methods Explanation;Demonstration;Verbal cues;Handout   Comprehension Verbalized understanding;Returned demonstration          OT Short Term Goals - 03/23/15 1655    OT SHORT TERM GOAL #1   Title I with HEP.   Baseline check 8/19/6   Time 4   Period Weeks   Status New   OT SHORT TERM GOAL #2   Title Pt will demonstrate ability to write a short paragraph with 95% or better accuracy, and minimal decrease in letter size.   Time 4   Period Weeks   Status New    OT SHORT TERM GOAL #3   Title Pt will perform all basic ADLs modified independently   Time 4   Period Weeks   Status New   OT SHORT TERM GOAL #4   Title Pt will improve RUE fine motor coordination as evidenced by performing 9 hole peg test in 35 secs or less.   Time 4   Period Weeks   Status New           OT Long Term Goals - 03/23/15 1702    OT LONG TERM GOAL #1   Title Pt will perform light home management and basic snack prep modified independently demonstrating good safety awareness.   Baseline due 05/22/15   Time 8   Period Weeks   Status New   OT LONG TERM GOAL #2   Title Pt will improve bilateral functional reach score to 10 inches or better, for improved functional reach/ standing balance.  Time 8   Period Weeks   Status New   OT LONG TERM GOAL #3   Title Pt will report increased activity tolerance as evidenced by pt report that she is able to stand for home management/ ADLs for 45 mins-1 hour prior to rest break.   Time 8   Period Weeks   Status New               Plan - 03/28/15 1321    Clinical Impression Statement Pt is progressing towards goals for fine motor coordination and handwriting. Pt required verbal cues for writing larger letter size and spacing.   Pt will benefit from skilled therapeutic intervention in order to improve on the following deficits (Retired) Abnormal gait;Decreased coordination;Decreased range of motion;Impaired flexibility;Decreased endurance;Decreased safety awareness;Decreased activity tolerance;Decreased balance;Impaired UE functional use;Decreased cognition;Decreased mobility;Decreased strength;Impaired vision/preception   Rehab Potential Good   OT Frequency 2x / week   OT Duration 8 weeks   OT Treatment/Interventions Self-care/ADL training;Moist Heat;Fluidtherapy;DME and/or AE instruction;Splinting;Patient/family education;Balance training;Therapeutic exercises;Contrast Bath;Ultrasound;Therapeutic exercise;Therapeutic  activities;Cognitive remediation/compensation;Passive range of motion;Functional Mobility Training;Neuromuscular education;Electrical Stimulation;Energy conservation;Manual Therapy;Visual/perceptual remediation/compensation   Plan HEP for strength, reinforce handwriting, standing balance   Consulted and Agree with Plan of Care Patient;Family member/caregiver   Family Member Consulted daughter        Problem List Patient Active Problem List   Diagnosis Date Noted  . Low TSH level 03/10/2015  . Other constipation 03/10/2015  . Constipation 03/08/2015  . Possible Bradycardia 02/22/2015  . Cerebellar hemorrhage, nontraumatic 02/22/2015  . Benign essential HTN 02/21/2015  . 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 Dizziness   . Hypokalemia   . Chronic combined systolic and diastolic congestive heart failure   . Atrial fibrillation with RVR   . HLD (hyperlipidemia)   . Obstructive hydrocephalus   . ICH (intracerebral hemorrhage) 02/05/2015  . COPD bronchitis 12/30/2014  . GERD (gastroesophageal reflux disease) 12/30/2014  . Bladder cancer 01/01/2014  . Glaucoma 01/01/2014  . DCIS (ductal carcinoma in situ) of breast 01/01/2014    Ryelynn Guedea 03/28/2015, 2:24 PM Theone Murdoch, OTR/L [phone] Phone: [phone] 2:24 PM 03/28/2015 [facility] [address] [place], [place], [address] Phone: [phone]   Fax:  [phone]

## 2015-03-29 ENCOUNTER — Ambulatory Visit: Payer: Medicare Other | Admitting: Physical Therapy

## 2015-03-29 ENCOUNTER — Encounter: Payer: Self-pay | Admitting: Physical Therapy

## 2015-03-29 ENCOUNTER — Ambulatory Visit: Payer: Medicare Other | Admitting: Occupational Therapy

## 2015-03-29 DIAGNOSIS — R278 Other lack of coordination: Secondary | ICD-10-CM

## 2015-03-29 DIAGNOSIS — R6889 Other general symptoms and signs: Secondary | ICD-10-CM

## 2015-03-29 DIAGNOSIS — R269 Unspecified abnormalities of gait and mobility: Secondary | ICD-10-CM

## 2015-03-29 DIAGNOSIS — R531 Weakness: Secondary | ICD-10-CM | POA: Diagnosis not present

## 2015-03-29 DIAGNOSIS — R279 Unspecified lack of coordination: Secondary | ICD-10-CM

## 2015-03-29 DIAGNOSIS — I69898 Other sequelae of other cerebrovascular disease: Secondary | ICD-10-CM | POA: Diagnosis not present

## 2015-03-29 DIAGNOSIS — IMO0002 Reserved for concepts with insufficient information to code with codable children: Secondary | ICD-10-CM

## 2015-03-29 DIAGNOSIS — M6281 Muscle weakness (generalized): Secondary | ICD-10-CM | POA: Diagnosis not present

## 2015-03-29 NOTE — Therapy (Signed)
[facility] [address] [place], [place], [address] Phone: [phone]   Fax:  [phone]  Occupational Therapy Treatment  Patient Details  Name: [name] MRN: [id number] Date of Birth: 05-May-1935 Referring Provider:  Janith Lima, MD  Encounter Date: 03/29/2015      OT End of Session - 03/29/15 1452    Visit Number 3   Number of Visits 17   Date for OT Re-Evaluation 05/22/15   Authorization Type Medicare   Authorization - Visit Number 3   Authorization - Number of Visits 10   OT Start Time 1448   OT Stop Time 1530   OT Time Calculation (min) 42 min   Activity Tolerance Other (comment);Patient limited by fatigue  Pt with low BP and had not eaten much, provided with peanut butter crackers   Behavior During Therapy [facility] for tasks assessed/performed      Past Medical History  Diagnosis Date  . History of CHF (congestive heart failure)     POST SURG 2010  . COPD (chronic obstructive pulmonary disease)   . History of breast cancer     DX DCIS IN 2004--  S/P RIGHT MASTECTOMY , NO CHEMORADIATION---  NO RECURRENCE  . Glaucoma of both eyes   . History of atrial fibrillation without current medication 2010    POST SURG 2010--  PER PT NO ISSUES SINCE  . Arthritis   . Dyspnea on exertion   . GERD (gastroesophageal reflux disease)   . Nephrolithiasis   . History of shingles     02/ 2015--  back of neck and left flank--  no residual pain  . History of hypertension   . Recurrent bladder papillary carcinoma first dx 06/ 2014    s/p  turbt's  and instillation mitomycin c (chemo)  . ICH (intracerebral hemorrhage) 02/05/2015    Past Surgical History  Procedure Laterality Date  . Total knee arthroplasty Right 03-22-2009  . Partial mastectomy with needle localization Right 01-14-2003    DCIS  . Total mastectomy Right 02-03-2003    W/ SLN BX  AND  POST 03-01-2003 EVACUATION HEMATOMA  . Removal cyst left  hand  2013  . Transthoracic echocardiogram  03-25-2009    MILD LVF/ EF 70-80%/ MILD INCREASE SYSTOLIC PULMONARY  PRESSURE  . Transurethral resection of bladder tumor with gyrus (turbt-gyrus) N/A 03/26/2013    Procedure: TRANSURETHRAL RESECTION OF BLADDER TUMOR WITH GYRUS (TURBT-GYRUS);  Surgeon: Alexis Frock, MD;  Location: [facility];  Service: Urology;  Laterality: N/A;  . Cystoscopy w/ ureteral stent placement Bilateral 03/26/2013    Procedure: CYSTOSCOPY WITH BILATERAL RETROGRADE PYELOGRAM/URETERAL STENT PLACEMENT;  Surgeon: Alexis Frock, MD;  Location: [facility];  Service: Urology;  Laterality: Bilateral;  . Transurethral resection of bladder tumor with gyrus (turbt-gyrus) N/A 05/13/2013    Procedure: TRANSURETHRAL RESECTION OF BLADDER TUMOR WITH GYRUS (TURBT-GYRUS)  RE-STAGING TRANSURETHRAL RESECTION OF BLADDER TUMOR, LEFT RETROGRADE PYELOGRAM AND STENT EXCHANGE;  Surgeon: Alexis Frock, MD;  Location: [facility];  Service: Urology;  Laterality: N/A;  . Cystoscopy w/ ureteral stent placement Left 05/13/2013    Procedure: CYSTOSCOPY WITH RETROGRADE PYELOGRAM/URETERAL STENT PLACEMENT STENT EXCHANGE;  Surgeon: Alexis Frock, MD;  Location: [facility];  Service: Urology;  Laterality: Left;  . Tubal ligation    . Cataract extraction w/ intraocular lens  implant, bilateral    . Transurethral resection of bladder tumor with gyrus (turbt-gyrus) N/A 08/11/2014    Procedure: TRANSURETHRAL  RESECTION OF BLADDER TUMOR WITH GYRUS (TURBT-GYRUS);  Surgeon: Alexis Frock, MD;  Location: [facility];  Service: Urology;  Laterality: N/A;  . Cystoscopy w/ retrogrades Bilateral 08/11/2014    Procedure: CYSTOSCOPY WITH BILATERAL RETROGRADE PYELOGRAM AND MITOMYCIN INSTILLATION;  Surgeon: Alexis Frock, MD;  Location: [facility];  Service: Urology;  Laterality: Bilateral;  . Craniectomy N/A 02/05/2015    Procedure:  SUBOCCIPITAL CRANIECTOMY EVACUATION OF HEMATOMA;  Surgeon: Consuella Lose, MD;  Location: [facility];  Service: Neurosurgery;  Laterality: N/A;    Filed Vitals:    Visit Diagnosis:  Weakness due to cerebrovascular accident  Decreased functional activity tolerance  Decreased coordination      Subjective Assessment - 03/29/15 1451    Subjective  Pt has not had a chance to try coordination HEP yet.   Patient is accompained by: Family member   Pertinent History see EPIC snap shot   Patient Stated Goals to get back to normal   Currently in Pain? No/denies                      OT Treatments/Exercises (OP) - 03/29/15 0001    ADLs   ADL Comments Needed cueing for breathing techniques during theraband ex.  See vital section for stats.  Pt needed rest breaks.   Fine Motor Coordination   Fine Motor Coordination Small Pegboard   Small Pegboard Placing small pegs pegboard to copy design with R hand with min-mod difficulty/incr time for coordination.   Functional Reaching Activities   High Level standing, mid-high level functional reaching with each UE at countertop and reaching laterally.  Pt placed clothespins with 1-8lb resistance on vertical pole with no LOB.                OT Education - 03/29/15 1708    Education Details Yellow theraband HEP for BUEs   Person(s) Educated Patient;Child(ren)   Methods Explanation;Demonstration;Verbal cues;Tactile cues   Comprehension Verbalized understanding;Returned demonstration;Verbal cues required          OT Short Term Goals - 03/23/15 1655    OT SHORT TERM GOAL #1   Title I with HEP.   Baseline check 8/19/6   Time 4   Period Weeks   Status New   OT SHORT TERM GOAL #2   Title Pt will demonstrate ability to write a short paragraph with 95% or better accuracy, and minimal decrease in letter size.   Time 4   Period Weeks   Status New   OT SHORT TERM GOAL #3   Title Pt will perform all basic ADLs modified  independently   Time 4   Period Weeks   Status New   OT SHORT TERM GOAL #4   Title Pt will improve RUE fine motor coordination as evidenced by performing 9 hole peg test in 35 secs or less.   Time 4   Period Weeks   Status New           OT Long Term Goals - 03/23/15 1702    OT LONG TERM GOAL #1   Title Pt will perform light home management and basic snack prep modified independently demonstrating good safety awareness.   Baseline due 05/22/15   Time 8   Period Weeks   Status New   OT LONG TERM GOAL #2   Title Pt will improve bilateral functional reach score to 10 inches or better, for improved functional reach/ standing balance.   Time 8  Period Weeks   Status New   OT LONG TERM GOAL #3   Title Pt will report increased activity tolerance as evidenced by pt report that she is able to stand for home management/ ADLs for 45 mins-1 hour prior to rest break.   Time 8   Period Weeks   Status New               Plan - 03/29/15 1711    Clinical Impression Statement Pt is progressing towards goals with coordination.  Pt needs cues for breathing techniques during exercise.   Plan review theraband HEP, reinforce handwriting   OT Home Exercise Plan Issued:  03/29/15 yellow theraband HEP   Consulted and Agree with Plan of Care Patient;Family member/caregiver   Family Member Consulted daughter        Problem List Patient Active Problem List   Diagnosis Date Noted  . Low TSH level 03/10/2015  . Other constipation 03/10/2015  . Constipation 03/08/2015  . Possible Bradycardia 02/22/2015  . Cerebellar hemorrhage, nontraumatic 02/22/2015  . Benign essential HTN 02/21/2015  . 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 Dizziness   . Hypokalemia   . Chronic combined systolic and diastolic congestive heart failure   . Atrial fibrillation with RVR   . HLD (hyperlipidemia)   . Obstructive hydrocephalus   . ICH (intracerebral hemorrhage) 02/05/2015  . COPD bronchitis  12/30/2014  . GERD (gastroesophageal reflux disease) 12/30/2014  . Bladder cancer 01/01/2014  . Glaucoma 01/01/2014  . DCIS (ductal carcinoma in situ) of breast 01/01/2014    [facility] 03/29/2015, 5:14 PM  [facility] [address] [place], [place], [address] Phone: [phone]   Fax:  [place], OTR/L 03/29/2015 5:14 PM

## 2015-03-29 NOTE — Patient Instructions (Signed)
Resisted Horizontal Abduction: Bilateral   Sit , tubing in both hands, arms out in front. Keeping arms straight, pinch shoulder blades together and stretch arms out. Complete 10 repetitions. Perform 1-2 sessions per day.  Repeat with other arm.   Elbow Flexion: Resisted   Hold tubing wrapped around  One hand and and other end secured under foot, curl arm up as far as possible. Complete 10 repetitions. Perform 1-2 sessions per day.  Repeat with other arm.  FLEXION: Sitting - Resistance Band (Active)   Sit with right arm down. Against yellow resistance band, lift arm forward and up as high as possible, keeping elbow straight and thumb up. Complete 10 repetitions. Perform 1-2 sessions per day.  Repeat with other arm.  (Go to shoulder height)   Copyright  VHI. All rights reserved.  ABDUCTION: Sitting - Resistance Band (Active)   Sit with right arm down. Against yellow resistance band, lift arm out to side and up as high as possible, keeping elbow straight and thumb up. Complete 10 repetitions. Perform 1-2 sessions per day.  Repeat with other arm.  Copyright  VHI. All rights reserved.  Extension (Resistive Band)   Holding band around hand and wrist, use elbow movements only. Using other arm as anchor, straighten elbow, pushing down. Hold 3 seconds. Complete 10 repetitions. Perform 1-2 sessions per day.  Repeat with other arm. Copyright  VHI. All rights reserved.

## 2015-03-29 NOTE — Patient Instructions (Signed)
Hip Abduction (Standing)   Stand in front of counter. Lift right leg out to side, keeping toe forward, and slowly bring back to center. Repeat _10__ times with right leg and repeat 10 times with left leg. Do _2__ times a day.   Copyright  VHI. All rights reserved.  EXTENSION: Standing (Active)   Stand in front of counter, both feet flat. Draw right leg behind body as far as possible keeping knee straight and bring back to center. Repeat 10 times. Repeat with left leg. Perform _2__ sessions per day.  http://gtsc.exer.us/77   Copyright  VHI. All rights reserved.  Feet Heel-Toe "Tandem", Varied Arm Positions - Eyes Open   Standing in corner with chair in front, with eyes open, right foot directly in front of the other, arms in comfortable position, look straight ahead at a stationary object. Hold _20___ seconds. Repeat __3__ times per session. Do __2__ sessions per day.  Copyright  VHI. All rights reserved.

## 2015-03-29 NOTE — Therapy (Signed)
[facility] [address] [place], [place], [address] Phone: [phone]   Fax:  [phone]  Physical Therapy Treatment  Patient Details  Name: [name] MRN: [id number] Date of Birth: [birth year] Referring Provider:  Janith Lima, MD  Encounter Date: 03/29/2015      PT End of Session - 03/29/15 1237    Visit Number 2   Number of Visits 16   Date for PT Re-Evaluation 05/25/15   Authorization Type G code every 10th visit   PT Start Time 1235   PT Stop Time 1315   PT Time Calculation (min) 40 min   Equipment Utilized During Treatment Gait belt   Activity Tolerance Patient tolerated treatment well;Patient limited by fatigue   Behavior During Therapy [facility] for tasks assessed/performed      Past Medical History  Diagnosis Date  . History of CHF (congestive heart failure)     POST SURG 2010  . COPD (chronic obstructive pulmonary disease)   . History of breast cancer     DX DCIS IN 2004--  S/P RIGHT MASTECTOMY , NO CHEMORADIATION---  NO RECURRENCE  . Glaucoma of both eyes   . History of atrial fibrillation without current medication 2010    POST SURG 2010--  PER PT NO ISSUES SINCE  . Arthritis   . Dyspnea on exertion   . GERD (gastroesophageal reflux disease)   . Nephrolithiasis   . History of shingles     02/ 2015--  back of neck and left flank--  no residual pain  . History of hypertension   . Recurrent bladder papillary carcinoma first dx 06/ 2014    s/p  turbt's  and instillation mitomycin c (chemo)  . ICH (intracerebral hemorrhage) 02/05/2015    Past Surgical History  Procedure Laterality Date  . Total knee arthroplasty Right 03-22-2009  . Partial mastectomy with needle localization Right 01-14-2003    DCIS  . Total mastectomy Right 02-03-2003    W/ SLN BX  AND  POST 03-01-2003 EVACUATION HEMATOMA  . Removal cyst left hand  2013  . Transthoracic echocardiogram  03-25-2009    MILD LVF/ EF  70-80%/ MILD INCREASE SYSTOLIC PULMONARY  PRESSURE  . Transurethral resection of bladder tumor with gyrus (turbt-gyrus) N/A 03/26/2013    Procedure: TRANSURETHRAL RESECTION OF BLADDER TUMOR WITH GYRUS (TURBT-GYRUS);  Surgeon: Alexis Frock, MD;  Location: [facility];  Service: Urology;  Laterality: N/A;  . Cystoscopy w/ ureteral stent placement Bilateral 03/26/2013    Procedure: CYSTOSCOPY WITH BILATERAL RETROGRADE PYELOGRAM/URETERAL STENT PLACEMENT;  Surgeon: Alexis Frock, MD;  Location: [facility];  Service: Urology;  Laterality: Bilateral;  . Transurethral resection of bladder tumor with gyrus (turbt-gyrus) N/A 05/13/2013    Procedure: TRANSURETHRAL RESECTION OF BLADDER TUMOR WITH GYRUS (TURBT-GYRUS)  RE-STAGING TRANSURETHRAL RESECTION OF BLADDER TUMOR, LEFT RETROGRADE PYELOGRAM AND STENT EXCHANGE;  Surgeon: Alexis Frock, MD;  Location: [facility];  Service: Urology;  Laterality: N/A;  . Cystoscopy w/ ureteral stent placement Left 05/13/2013    Procedure: CYSTOSCOPY WITH RETROGRADE PYELOGRAM/URETERAL STENT PLACEMENT STENT EXCHANGE;  Surgeon: Alexis Frock, MD;  Location: [facility];  Service: Urology;  Laterality: Left;  . Tubal ligation    . Cataract extraction w/ intraocular lens  implant, bilateral    . Transurethral resection of bladder tumor with gyrus (turbt-gyrus) N/A 08/11/2014    Procedure: TRANSURETHRAL RESECTION OF BLADDER TUMOR WITH GYRUS (TURBT-GYRUS);  Surgeon: Alexis Frock, MD;  Location: [facility];  Service: Urology;  Laterality: N/A;  . Cystoscopy w/ retrogrades Bilateral 08/11/2014    Procedure: CYSTOSCOPY WITH BILATERAL RETROGRADE PYELOGRAM AND MITOMYCIN INSTILLATION;  Surgeon: Alexis Frock, MD;  Location: [facility];  Service: Urology;  Laterality: Bilateral;  . Craniectomy N/A 02/05/2015    Procedure: SUBOCCIPITAL CRANIECTOMY EVACUATION OF HEMATOMA;  Surgeon: Consuella Lose,  MD;  Location: [facility];  Service: Neurosurgery;  Laterality: N/A;    There were no vitals filed for this visit.  Visit Diagnosis:  Weakness due to cerebrovascular accident  Decreased functional activity tolerance  Decreased coordination  Abnormality of gait      Subjective Assessment - 03/29/15 1237    Subjective No new complaints or falls to report. Denies pain.   Pertinent History scoliosis., R hip higher per patient   Patient Stated Goals Return to prior level of function (cane at night due to spinal curve), no device during the day   Currently in Pain? No/denies     Treatment:  Performed HEP (See Pt Instructions): min guard to min assist for balance; monitored O2; cues for posture, ex form, and technique; pt required frequent rest breaks and use of deep breathing techniques due to dizziness, fatigue, and SOB         PT Education - 03/29/15 1556    Education provided Yes   Education Details HEP: hip strengthening at countertop, corner balance ex   Person(s) Educated Patient;Child(ren)   Methods Explanation;Demonstration;Verbal cues;Handout   Comprehension Verbalized understanding;Returned demonstration;Verbal cues required;Need further instruction          PT Short Term Goals - 03/24/15 1203    PT SHORT TERM GOAL #1   Title Patient will return demo HEP for LE strength, balance and endurance tasks.   Baseline Target date 04/24/2015   Time 4   Period Weeks   PT SHORT TERM GOAL #2   Title Improve Berg from 34/56 up to 38/56 to demo decreased risk for falls.   Baseline Target date 04/24/2015   Time 4   Period Weeks   PT SHORT TERM GOAL #3   Title Improve gait speed from 1.99 ft/sec up to 2.4 ft/sec to demo improving functional mobility.   Baseline Target date 04/24/2015   Time 4   Period Weeks   PT SHORT TERM GOAL #4   Title Tolerate ambulation x 5 minutes nonstop with least restrictive assistive device for improved household and limited community mobility.    Baseline Target date 04/24/2015   Time 4   Period Weeks   PT SHORT TERM GOAL #5   Title Patient will ambulate without a device x 200 ft with close supervision for improved household mobility.   Baseline Target date 04/24/2015   Time 4   Period Weeks           PT Long Term Goals - 03/24/15 1202    PT LONG TERM GOAL #1   Title Verbalize understanding of stroke risk factors and warning signs.   Baseline Target date 05/25/2015   Time 8   Period Weeks   PT LONG TERM GOAL #2   Title Improve Berg from 34/56 upt o 44/56 to demo decreasing risk for falls.   Baseline Target date 05/25/2015   Time 8   Period Weeks   PT LONG TERM GOAL #3   Title Improve gait speed from 1.99 ft/sec up to 2.62 ft/sec to demo transition to limited community ambulator classification of gait.   Baseline Target date 05/25/2015  Time 8   Period Weeks   PT LONG TERM GOAL #4   Title Negotiate 4 steps x 4 reps (to make flight of steps) modified indep with one handrail (baseline=CGA).   Baseline Target date 05/25/2015   Time 8   Period Weeks   PT LONG TERM GOAL #5   Title Improve SIS mobility from 60.1% up to 75% to demo improved functional mobility.   Baseline Target date 05/25/2015   Time 8   Period Weeks           Plan - 03/29/15 1600    Clinical Impression Statement Established initial HEP for strengthening/balance exercises. Pt able to perform exercises, but required frequent rest breaks due to dizziness and fatigue. Monitored O2 throughout session with frequent desat below 90% (lowest 86%). Pt able to utilize deep breathing techniques and increase O2 sats to 92-94% during each rest break. Making steady progress toward goals.   Pt will benefit from skilled therapeutic intervention in order to improve on the following deficits Abnormal gait;Decreased balance;Decreased activity tolerance;Decreased mobility;Decreased strength;Postural dysfunction;Impaired flexibility;Decreased endurance;Difficulty walking    Rehab Potential Good   Clinical Impairments Affecting Rehab Potential h/o COPD with shortness of breath noted   PT Frequency 2x / week   PT Duration 8 weeks   PT Treatment/Interventions Functional mobility training;Stair training;Gait training;Patient/family education;Therapeutic activities;Therapeutic exercise;Balance training;Neuromuscular re-education;ADLs/Self Care Home Management;Energy conservation   PT Next Visit Plan MONITOR O2 level: Expand HEP: Countertop exercises and/or corner balance decreasing dependence on assistive device for balance.   Consulted and Agree with Plan of Care Patient        Problem List Patient Active Problem List   Diagnosis Date Noted  . Low TSH level 03/10/2015  . Other constipation 03/10/2015  . Constipation 03/08/2015  . Possible Bradycardia 02/22/2015  . Cerebellar hemorrhage, nontraumatic 02/22/2015  . Benign essential HTN 02/21/2015  . 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 Dizziness   . Hypokalemia   . Chronic combined systolic and diastolic congestive heart failure   . Atrial fibrillation with RVR   . HLD (hyperlipidemia)   . Obstructive hydrocephalus   . ICH (intracerebral hemorrhage) 02/05/2015  . COPD bronchitis 12/30/2014  . GERD (gastroesophageal reflux disease) 12/30/2014  . Bladder cancer 01/01/2014  . Glaucoma 01/01/2014  . DCIS (ductal carcinoma in situ) of breast 01/01/2014    Rubye Oaks 03/29/2015, 4:10 PM  [place] Oaks, [place] [address] [place], [place], [address] Phone: [phone]   Fax:  [phone]

## 2015-03-31 ENCOUNTER — Ambulatory Visit: Payer: Medicare Other | Admitting: Rehabilitative and Restorative Service Providers"

## 2015-03-31 DIAGNOSIS — R269 Unspecified abnormalities of gait and mobility: Secondary | ICD-10-CM

## 2015-03-31 DIAGNOSIS — M6281 Muscle weakness (generalized): Secondary | ICD-10-CM

## 2015-03-31 DIAGNOSIS — R6889 Other general symptoms and signs: Secondary | ICD-10-CM | POA: Diagnosis not present

## 2015-03-31 DIAGNOSIS — I69898 Other sequelae of other cerebrovascular disease: Secondary | ICD-10-CM | POA: Diagnosis not present

## 2015-03-31 DIAGNOSIS — R531 Weakness: Secondary | ICD-10-CM | POA: Diagnosis not present

## 2015-03-31 DIAGNOSIS — R279 Unspecified lack of coordination: Secondary | ICD-10-CM | POA: Diagnosis not present

## 2015-03-31 NOTE — Therapy (Signed)
[facility] [address] [place], [place], [address] Phone: [phone]   Fax:  [phone]  Physical Therapy Treatment  Patient Details  Name: [name] MRN: [id number] Date of Birth: Aug 11, 1935 Referring Provider:  Janith Lima, MD  Encounter Date: 03/31/2015      PT End of Session - 03/31/15 1424    Visit Number 3   Number of Visits 16   Date for PT Re-Evaluation 05/25/15   Authorization Type G code every 10th visit   PT Start Time 1320   PT Stop Time 1405   PT Time Calculation (min) 45 min   Equipment Utilized During Treatment Gait belt   Activity Tolerance Patient tolerated treatment well;Patient limited by fatigue   Behavior During Therapy [facility] for tasks assessed/performed      Past Medical History  Diagnosis Date  . History of CHF (congestive heart failure)     POST SURG 2010  . COPD (chronic obstructive pulmonary disease)   . History of breast cancer     DX DCIS IN 2004--  S/P RIGHT MASTECTOMY , NO CHEMORADIATION---  NO RECURRENCE  . Glaucoma of both eyes   . History of atrial fibrillation without current medication 2010    POST SURG 2010--  PER PT NO ISSUES SINCE  . Arthritis   . Dyspnea on exertion   . GERD (gastroesophageal reflux disease)   . Nephrolithiasis   . History of shingles     02/ 2015--  back of neck and left flank--  no residual pain  . History of hypertension   . Recurrent bladder papillary carcinoma first dx 06/ 2014    s/p  turbt's  and instillation mitomycin c (chemo)  . ICH (intracerebral hemorrhage) 02/05/2015    Past Surgical History  Procedure Laterality Date  . Total knee arthroplasty Right 03-22-2009  . Partial mastectomy with needle localization Right 01-14-2003    DCIS  . Total mastectomy Right 02-03-2003    W/ SLN BX  AND  POST 03-01-2003 EVACUATION HEMATOMA  . Removal cyst left hand  2013  . Transthoracic echocardiogram  03-25-2009    MILD LVF/ EF  70-80%/ MILD INCREASE SYSTOLIC PULMONARY  PRESSURE  . Transurethral resection of bladder tumor with gyrus (turbt-gyrus) N/A 03/26/2013    Procedure: TRANSURETHRAL RESECTION OF BLADDER TUMOR WITH GYRUS (TURBT-GYRUS);  Surgeon: Alexis Frock, MD;  Location: [facility];  Service: Urology;  Laterality: N/A;  . Cystoscopy w/ ureteral stent placement Bilateral 03/26/2013    Procedure: CYSTOSCOPY WITH BILATERAL RETROGRADE PYELOGRAM/URETERAL STENT PLACEMENT;  Surgeon: Alexis Frock, MD;  Location: [facility];  Service: Urology;  Laterality: Bilateral;  . Transurethral resection of bladder tumor with gyrus (turbt-gyrus) N/A 05/13/2013    Procedure: TRANSURETHRAL RESECTION OF BLADDER TUMOR WITH GYRUS (TURBT-GYRUS)  RE-STAGING TRANSURETHRAL RESECTION OF BLADDER TUMOR, LEFT RETROGRADE PYELOGRAM AND STENT EXCHANGE;  Surgeon: Alexis Frock, MD;  Location: [facility];  Service: Urology;  Laterality: N/A;  . Cystoscopy w/ ureteral stent placement Left 05/13/2013    Procedure: CYSTOSCOPY WITH RETROGRADE PYELOGRAM/URETERAL STENT PLACEMENT STENT EXCHANGE;  Surgeon: Alexis Frock, MD;  Location: [facility];  Service: Urology;  Laterality: Left;  . Tubal ligation    . Cataract extraction w/ intraocular lens  implant, bilateral    . Transurethral resection of bladder tumor with gyrus (turbt-gyrus) N/A 08/11/2014    Procedure: TRANSURETHRAL RESECTION OF BLADDER TUMOR WITH GYRUS (TURBT-GYRUS);  Surgeon: Alexis Frock, MD;  Location: [facility];  Service: Urology;  Laterality: N/A;  . Cystoscopy w/ retrogrades Bilateral 08/11/2014    Procedure: CYSTOSCOPY WITH BILATERAL RETROGRADE PYELOGRAM AND MITOMYCIN INSTILLATION;  Surgeon: Alexis Frock, MD;  Location: [facility];  Service: Urology;  Laterality: Bilateral;  . Craniectomy N/A 02/05/2015    Procedure: SUBOCCIPITAL CRANIECTOMY EVACUATION OF HEMATOMA;  Surgeon: Consuella Lose,  MD;  Location: [place] [facility];  Service: Neurosurgery;  Laterality: N/A;    There were no vitals filed for this visit.  Visit Diagnosis:  Abnormality of gait  Generalized muscle weakness      Subjective Assessment - 03/31/15 1322    Subjective The patient is planning to go to the beach for the weekend.  She wants to switch to [facility]   Patient is accompained by: Family member   Currently in Pain? No/denies      NEUROMUSCULAR RE-EDUCATION: Rocker board without UE support with CGA to min A adding head turns and proactive UE reaching activities allowing patient to correct during loss of balance Sit<>stand x  3 reps x 3 sets with cues on postural upright and shoulder rolls for scapular depression/retraction Standing alternating LE foot taps to 6" surface with min A Wall bumps for posterior/anterior weight shifting and balance control  Gait: Ambulation without a device with CGA x 115 ft x 4 reps with O2=> or equal to 94% each lap with cues on wider base of support and upright head position Ambulation with SPC initially attempting R UE (pt used on R side prior to CVA) with patient requiring min A, moved SPC to L side and patient requires CGA, although reports feels awkward.    Stair negotiation x 4 steps x 3 reps with SBA to CGA with right railing and reciprocal pattern Backwards walking with min A Direction changes with gait with min A and gait navigating tight spaces with min A  SELF CARE/HOME MANAGEMENT: Discussed home safety for travel to the beach, stair negotiation, use of RW and need for supervision.       PT Education - 03/31/15 1422    Education provided Yes   Education Details Recommended continued use of RW, if tries SPC in left hand patient should have family for CGA.   Person(s) Educated Patient;Child(ren)   Methods Explanation   Comprehension Verbalized understanding;Returned demonstration          PT Short Term Goals - 03/24/15 1203    PT SHORT TERM GOAL #1    Title Patient will return demo HEP for LE strength, balance and endurance tasks.   Baseline Target date 04/24/2015   Time 4   Period Weeks   PT SHORT TERM GOAL #2   Title Improve Berg from 34/56 up to 38/56 to demo decreased risk for falls.   Baseline Target date 04/24/2015   Time 4   Period Weeks   PT SHORT TERM GOAL #3   Title Improve gait speed from 1.99 ft/sec up to 2.4 ft/sec to demo improving functional mobility.   Baseline Target date 04/24/2015   Time 4   Period Weeks   PT SHORT TERM GOAL #4   Title Tolerate ambulation x 5 minutes nonstop with least restrictive assistive device for improved household and limited community mobility.   Baseline Target date 04/24/2015   Time 4   Period Weeks   PT SHORT TERM GOAL #5   Title Patient will ambulate without a device x 200 ft with close supervision for improved household mobility.   Baseline Target date  04/24/2015   Time 4   Period Weeks           PT Long Term Goals - 03/24/15 1202    PT LONG TERM GOAL #1   Title Verbalize understanding of stroke risk factors and warning signs.   Baseline Target date 05/25/2015   Time 8   Period Weeks   PT LONG TERM GOAL #2   Title Improve Berg from 34/56 upt o 44/56 to demo decreasing risk for falls.   Baseline Target date 05/25/2015   Time 8   Period Weeks   PT LONG TERM GOAL #3   Title Improve gait speed from 1.99 ft/sec up to 2.62 ft/sec to demo transition to limited community ambulator classification of gait.   Baseline Target date 05/25/2015   Time 8   Period Weeks   PT LONG TERM GOAL #4   Title Negotiate 4 steps x 4 reps (to make flight of steps) modified indep with one handrail (baseline=CGA).   Baseline Target date 05/25/2015   Time 8   Period Weeks   PT LONG TERM GOAL #5   Title Improve SIS mobility from 60.1% up to 75% to demo improved functional mobility.   Baseline Target date 05/25/2015   Time 8   Period Weeks               Plan - 03/31/15 1425    Clinical  Impression Statement The patient tolerated gait without a device and CGA well.  She needs min A due to imbalance with directional changes for backwards walking and sidestepping.  PT monitored Oxygen saturdation t/o treatment and she maintained >94%.   PT Next Visit Plan monitor O2 level, gait without device, progress HEP as needed.  *check on how beach trip went   Consulted and Agree with Plan of Care Patient;Family member/caregiver   Family Member Consulted daughter        Problem List Patient Active Problem List   Diagnosis Date Noted  . Low TSH level 03/10/2015  . Other constipation 03/10/2015  . Constipation 03/08/2015  . Possible Bradycardia 02/22/2015  . Cerebellar hemorrhage, nontraumatic 02/22/2015  . Benign essential HTN 02/21/2015  . 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 Dizziness   . Hypokalemia   . Chronic combined systolic and diastolic congestive heart failure   . Atrial fibrillation with RVR   . HLD (hyperlipidemia)   . Obstructive hydrocephalus   . ICH (intracerebral hemorrhage) 02/05/2015  . COPD bronchitis 12/30/2014  . GERD (gastroesophageal reflux disease) 12/30/2014  . Bladder cancer 01/01/2014  . Glaucoma 01/01/2014  . DCIS (ductal carcinoma in situ) of breast 01/01/2014    Yulissa Needham, PT 03/31/2015, 2:35 PM  [facility] [address] [place], [place], [address] Phone: [phone]   Fax:  [phone]

## 2015-04-04 ENCOUNTER — Encounter: Payer: Self-pay | Admitting: Physical Therapy

## 2015-04-04 ENCOUNTER — Ambulatory Visit: Payer: Medicare Other | Admitting: Occupational Therapy

## 2015-04-04 ENCOUNTER — Ambulatory Visit: Payer: Medicare Other | Attending: Internal Medicine | Admitting: Physical Therapy

## 2015-04-04 DIAGNOSIS — R279 Unspecified lack of coordination: Secondary | ICD-10-CM | POA: Insufficient documentation

## 2015-04-04 DIAGNOSIS — M6281 Muscle weakness (generalized): Secondary | ICD-10-CM | POA: Insufficient documentation

## 2015-04-04 DIAGNOSIS — R269 Unspecified abnormalities of gait and mobility: Secondary | ICD-10-CM | POA: Insufficient documentation

## 2015-04-04 DIAGNOSIS — R531 Weakness: Secondary | ICD-10-CM | POA: Diagnosis not present

## 2015-04-04 DIAGNOSIS — R278 Other lack of coordination: Secondary | ICD-10-CM

## 2015-04-04 DIAGNOSIS — R6889 Other general symptoms and signs: Secondary | ICD-10-CM

## 2015-04-04 DIAGNOSIS — IMO0002 Reserved for concepts with insufficient information to code with codable children: Secondary | ICD-10-CM

## 2015-04-04 DIAGNOSIS — I69898 Other sequelae of other cerebrovascular disease: Secondary | ICD-10-CM | POA: Diagnosis not present

## 2015-04-04 NOTE — Patient Instructions (Signed)
"  I love a Database administrator   At counter top: high knee marching forward and then backwards. Repeat for 3 laps each way. Do _1-2___ sessions per day.  http://[email or web address].exer.us/345   Copyright  VHI. All rights reserved.  Walking on Toes   At counter top: Walk on toes forward while continuing on a straight path and then backward on toes to start position. 3 laps each way. Do __1-2__ sessions per day.  Copyright  VHI. All rights reserved.  Walking on Heels   At counter: Walk on heels forward while continuing on a straight path and then on heels backwards to start position. Repeat 3 laps each way. Do _1-2__ sessions per day.  Copyright  VHI. All rights reserved.  Side-Stepping   At counter: Walk to left side with eyes open. Take even steps, leading with same foot. Make sure each foot lifts off the floor. Repeat in opposite direction. Have your hands on your hips and stand up tall. Repeat for 3 laps each way. Do __1-2__ sessions per day.  Copyright  VHI. All rights reserved.

## 2015-04-04 NOTE — Therapy (Signed)
[facility] [address] [place], [place], [address] Phone: [phone]   Fax:  [phone]  Physical Therapy Treatment  Patient Details  Name: [name] MRN: [id number] Date of Birth: [birth year] Referring Provider:  Janith Lima, MD  Encounter Date: 04/04/2015      PT End of Session - 04/04/15 1022    Visit Number 4   Number of Visits 16   Date for PT Re-Evaluation 05/25/15   Authorization Type G code every 10th visit   PT Start Time 1018   PT Stop Time 1058   PT Time Calculation (min) 40 min   Equipment Utilized During Treatment Gait belt   Activity Tolerance Patient tolerated treatment well;Patient limited by fatigue   Behavior During Therapy [facility] for tasks assessed/performed      Past Medical History  Diagnosis Date  . History of CHF (congestive heart failure)     POST SURG 2010  . COPD (chronic obstructive pulmonary disease)   . History of breast cancer     DX DCIS IN 2004--  S/P RIGHT MASTECTOMY , NO CHEMORADIATION---  NO RECURRENCE  . Glaucoma of both eyes   . History of atrial fibrillation without current medication 2010    POST SURG 2010--  PER PT NO ISSUES SINCE  . Arthritis   . Dyspnea on exertion   . GERD (gastroesophageal reflux disease)   . Nephrolithiasis   . History of shingles     02/ 2015--  back of neck and left flank--  no residual pain  . History of hypertension   . Recurrent bladder papillary carcinoma first dx 06/ 2014    s/p  turbt's  and instillation mitomycin c (chemo)  . ICH (intracerebral hemorrhage) 02/05/2015    Past Surgical History  Procedure Laterality Date  . Total knee arthroplasty Right 03-22-2009  . Partial mastectomy with needle localization Right 01-14-2003    DCIS  . Total mastectomy Right 02-03-2003    W/ SLN BX  AND  POST 03-01-2003 EVACUATION HEMATOMA  . Removal cyst left hand  2013  . Transthoracic echocardiogram  03-25-2009    MILD LVF/ EF  70-80%/ MILD INCREASE SYSTOLIC PULMONARY  PRESSURE  . Transurethral resection of bladder tumor with gyrus (turbt-gyrus) N/A 03/26/2013    Procedure: TRANSURETHRAL RESECTION OF BLADDER TUMOR WITH GYRUS (TURBT-GYRUS);  Surgeon: Alexis Frock, MD;  Location: [facility];  Service: Urology;  Laterality: N/A;  . Cystoscopy w/ ureteral stent placement Bilateral 03/26/2013    Procedure: CYSTOSCOPY WITH BILATERAL RETROGRADE PYELOGRAM/URETERAL STENT PLACEMENT;  Surgeon: Alexis Frock, MD;  Location: [facility];  Service: Urology;  Laterality: Bilateral;  . Transurethral resection of bladder tumor with gyrus (turbt-gyrus) N/A 05/13/2013    Procedure: TRANSURETHRAL RESECTION OF BLADDER TUMOR WITH GYRUS (TURBT-GYRUS)  RE-STAGING TRANSURETHRAL RESECTION OF BLADDER TUMOR, LEFT RETROGRADE PYELOGRAM AND STENT EXCHANGE;  Surgeon: Alexis Frock, MD;  Location: [facility];  Service: Urology;  Laterality: N/A;  . Cystoscopy w/ ureteral stent placement Left 05/13/2013    Procedure: CYSTOSCOPY WITH RETROGRADE PYELOGRAM/URETERAL STENT PLACEMENT STENT EXCHANGE;  Surgeon: Alexis Frock, MD;  Location: [facility];  Service: Urology;  Laterality: Left;  . Tubal ligation    . Cataract extraction w/ intraocular lens  implant, bilateral    . Transurethral resection of bladder tumor with gyrus (turbt-gyrus) N/A 08/11/2014    Procedure: TRANSURETHRAL RESECTION OF BLADDER TUMOR WITH GYRUS (TURBT-GYRUS);  Surgeon: Alexis Frock, MD;  Location: [facility];  Service: Urology;  Laterality: N/A;  . Cystoscopy w/ retrogrades Bilateral 08/11/2014    Procedure: CYSTOSCOPY WITH BILATERAL RETROGRADE PYELOGRAM AND MITOMYCIN INSTILLATION;  Surgeon: Alexis Frock, MD;  Location: [facility];  Service: Urology;  Laterality: Bilateral;  . Craniectomy N/A 02/05/2015    Procedure: SUBOCCIPITAL CRANIECTOMY EVACUATION OF HEMATOMA;  Surgeon: Consuella Lose,  MD;  Location: [facility];  Service: Neurosurgery;  Laterality: N/A;    There were no vitals filed for this visit.  Visit Diagnosis:  Abnormality of gait  Generalized muscle weakness  Decreased coordination  Weakness due to cerebrovascular accident  Decreased functional activity tolerance      Subjective Assessment - 04/04/15 1021    Subjective Reports beach trip went well. No falls or pain to report. Has not been doing HEP as she has been too busy.   Currently in Pain? No/denies   Pain Score 0-No pain           [facility] Adult PT Treatment/Exercise - 04/04/15 1027    Ambulation/Gait   Ambulation/Gait Yes   Ambulation/Gait Assistance 4: Min guard;4: Min assist   Ambulation/Gait Assistance Details cues on posture, increased base of support and for step length with gait. SaO2 decreased to 90% with gait trials, increased to >/= 93% all on room air.    Ambulation Distance (Feet) 210 Feet x 2 reps   Assistive device None   Gait Pattern Step-through pattern;Decreased stride length;Narrow base of support   Ambulation Surface Level;Indoor     Exercises: At counter: Hip abduct and hip extension x 10 each to bil legs with cues on posture and ex form/technique. Added new exercises to pt's HEP for balance. Refer to pt education section for full details.        PT Education - 04/04/15 1049    Education provided Yes   Education Details HEP: at counter- high knee marching, toe walk, heel walk, and side stepping   Person(s) Educated Patient;Child(ren)   Methods Explanation;Demonstration;Handout   Comprehension Verbalized understanding;Returned demonstration          PT Short Term Goals - 03/24/15 1203    PT SHORT TERM GOAL #1   Title Patient will return demo HEP for LE strength, balance and endurance tasks.   Baseline Target date 04/24/2015   Time 4   Period Weeks   PT SHORT TERM GOAL #2   Title Improve Berg from 34/56 up to 38/56 to demo decreased risk for falls.    Baseline Target date 04/24/2015   Time 4   Period Weeks   PT SHORT TERM GOAL #3   Title Improve gait speed from 1.99 ft/sec up to 2.4 ft/sec to demo improving functional mobility.   Baseline Target date 04/24/2015   Time 4   Period Weeks   PT SHORT TERM GOAL #4   Title Tolerate ambulation x 5 minutes nonstop with least restrictive assistive device for improved household and limited community mobility.   Baseline Target date 04/24/2015   Time 4   Period Weeks   PT SHORT TERM GOAL #5   Title Patient will ambulate without a device x 200 ft with close supervision for improved household mobility.   Baseline Target date 04/24/2015   Time 4   Period Weeks           PT Long Term Goals - 03/24/15 1202    PT LONG TERM GOAL #1   Title Verbalize understanding of stroke risk factors and warning signs.  Baseline Target date 05/25/2015   Time 8   Period Weeks   PT LONG TERM GOAL #2   Title Improve Berg from 34/56 upt o 44/56 to demo decreasing risk for falls.   Baseline Target date 05/25/2015   Time 8   Period Weeks   PT LONG TERM GOAL #3   Title Improve gait speed from 1.99 ft/sec up to 2.62 ft/sec to demo transition to limited community ambulator classification of gait.   Baseline Target date 05/25/2015   Time 8   Period Weeks   PT LONG TERM GOAL #4   Title Negotiate 4 steps x 4 reps (to make flight of steps) modified indep with one handrail (baseline=CGA).   Baseline Target date 05/25/2015   Time 8   Period Weeks   PT LONG TERM GOAL #5   Title Improve SIS mobility from 60.1% up to 75% to demo improved functional mobility.   Baseline Target date 05/25/2015   Time 8   Period Weeks           Plan - 04/04/15 1022    Clinical Impression Statement Improved gait distance and tolerance today before needed rest break due to shortness of breath. Added new balance exercises at counter to pt's HEP. Pt making steady progress toward goals.   Pt will benefit from skilled therapeutic  intervention in order to improve on the following deficits Abnormal gait;Decreased balance;Decreased activity tolerance;Decreased mobility;Decreased strength;Postural dysfunction;Impaired flexibility;Decreased endurance;Difficulty walking   Rehab Potential Good   Clinical Impairments Affecting Rehab Potential h/o COPD with shortness of breath noted   PT Frequency 2x / week   PT Duration 8 weeks   PT Treatment/Interventions Functional mobility training;Stair training;Gait training;Patient/family education;Therapeutic activities;Therapeutic exercise;Balance training;Neuromuscular re-education;ADLs/Self Care Home Management;Energy conservation   PT Next Visit Plan monitor O2 level, gait without device, progress HEP as needed.    Consulted and Agree with Plan of Care Patient;Family member/caregiver   Family Member Consulted daughter        Problem List Patient Active Problem List   Diagnosis Date Noted  . Low TSH level 03/10/2015  . Other constipation 03/10/2015  . Constipation 03/08/2015  . Possible Bradycardia 02/22/2015  . Cerebellar hemorrhage, nontraumatic 02/22/2015  . Benign essential HTN 02/21/2015  . 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 Dizziness   . Hypokalemia   . Chronic combined systolic and diastolic congestive heart failure   . Atrial fibrillation with RVR   . HLD (hyperlipidemia)   . Obstructive hydrocephalus   . ICH (intracerebral hemorrhage) 02/05/2015  . COPD bronchitis 12/30/2014  . GERD (gastroesophageal reflux disease) 12/30/2014  . Bladder cancer 01/01/2014  . Glaucoma 01/01/2014  . DCIS (ductal carcinoma in situ) of breast 01/01/2014    [name] 04/04/2015, 1:54 PM  [name], PTA, [facility] [address] [place], [place] [address] [phone] 04/04/2015, 1:55 PM

## 2015-04-04 NOTE — Therapy (Signed)
[facility] [address] [place], [place], [address] Phone: [phone]   Fax:  [phone]  Occupational Therapy Treatment  Patient Details  Name: [name] MRN: [id number] Date of Birth: 08-Nov-1934 Referring Provider:  Janith Lima, MD  Encounter Date: 04/04/2015      OT End of Session - 04/04/15 0941    Visit Number 4   Number of Visits 17   Date for OT Re-Evaluation 05/22/15   Authorization Type Medicare   Authorization - Visit Number 4   Authorization - Number of Visits 10   OT Start Time [phone]   OT Stop Time 1015   OT Time Calculation (min) 38 min   Activity Tolerance Other (comment);Patient limited by fatigue  Pt with low BP and had not eaten much, provided with peanut butter crackers   Behavior During Therapy [facility] for tasks assessed/performed      Past Medical History  Diagnosis Date  . History of CHF (congestive heart failure)     POST SURG 2010  . COPD (chronic obstructive pulmonary disease)   . History of breast cancer     DX DCIS IN 2004--  S/P RIGHT MASTECTOMY , NO CHEMORADIATION---  NO RECURRENCE  . Glaucoma of both eyes   . History of atrial fibrillation without current medication 2010    POST SURG 2010--  PER PT NO ISSUES SINCE  . Arthritis   . Dyspnea on exertion   . GERD (gastroesophageal reflux disease)   . Nephrolithiasis   . History of shingles     02/ 2015--  back of neck and left flank--  no residual pain  . History of hypertension   . Recurrent bladder papillary carcinoma first dx 06/ 2014    s/p  turbt's  and instillation mitomycin c (chemo)  . ICH (intracerebral hemorrhage) 02/05/2015    Past Surgical History  Procedure Laterality Date  . Total knee arthroplasty Right 03-22-2009  . Partial mastectomy with needle localization Right 01-14-2003    DCIS  . Total mastectomy Right 02-03-2003    W/ SLN BX  AND  POST 03-01-2003 EVACUATION HEMATOMA  . Removal cyst left  hand  2013  . Transthoracic echocardiogram  03-25-2009    MILD LVF/ EF 70-80%/ MILD INCREASE SYSTOLIC PULMONARY  PRESSURE  . Transurethral resection of bladder tumor with gyrus (turbt-gyrus) N/A 03/26/2013    Procedure: TRANSURETHRAL RESECTION OF BLADDER TUMOR WITH GYRUS (TURBT-GYRUS);  Surgeon: Alexis Frock, MD;  Location: [facility];  Service: Urology;  Laterality: N/A;  . Cystoscopy w/ ureteral stent placement Bilateral 03/26/2013    Procedure: CYSTOSCOPY WITH BILATERAL RETROGRADE PYELOGRAM/URETERAL STENT PLACEMENT;  Surgeon: Alexis Frock, MD;  Location: [facility];  Service: Urology;  Laterality: Bilateral;  . Transurethral resection of bladder tumor with gyrus (turbt-gyrus) N/A 05/13/2013    Procedure: TRANSURETHRAL RESECTION OF BLADDER TUMOR WITH GYRUS (TURBT-GYRUS)  RE-STAGING TRANSURETHRAL RESECTION OF BLADDER TUMOR, LEFT RETROGRADE PYELOGRAM AND STENT EXCHANGE;  Surgeon: Alexis Frock, MD;  Location: [facility];  Service: Urology;  Laterality: N/A;  . Cystoscopy w/ ureteral stent placement Left 05/13/2013    Procedure: CYSTOSCOPY WITH RETROGRADE PYELOGRAM/URETERAL STENT PLACEMENT STENT EXCHANGE;  Surgeon: Alexis Frock, MD;  Location: [facility];  Service: Urology;  Laterality: Left;  . Tubal ligation    . Cataract extraction w/ intraocular lens  implant, bilateral    . Transurethral resection of bladder tumor with gyrus (turbt-gyrus) N/A 08/11/2014    Procedure: TRANSURETHRAL  RESECTION OF BLADDER TUMOR WITH GYRUS (TURBT-GYRUS);  Surgeon: Alexis Frock, MD;  Location: [facility];  Service: Urology;  Laterality: N/A;  . Cystoscopy w/ retrogrades Bilateral 08/11/2014    Procedure: CYSTOSCOPY WITH BILATERAL RETROGRADE PYELOGRAM AND MITOMYCIN INSTILLATION;  Surgeon: Alexis Frock, MD;  Location: [facility];  Service: Urology;  Laterality: Bilateral;  . Craniectomy N/A 02/05/2015    Procedure:  SUBOCCIPITAL CRANIECTOMY EVACUATION OF HEMATOMA;  Surgeon: Consuella Lose, MD;  Location: [facility];  Service: Neurosurgery;  Laterality: N/A;    Filed Vitals:    Visit Diagnosis:  Decreased coordination  Weakness due to cerebrovascular accident  Decreased functional activity tolerance      Subjective Assessment - 04/04/15 0940    Subjective  Pt was at the beach and has not done any exercises.   Patient is accompained by: Family member   Pertinent History see EPIC snap shot   Patient Stated Goals to get back to normal   Currently in Pain? No/denies                      OT Treatments/Exercises (OP) - 04/04/15 0001    ADLs   Writing Practiced writing name and address with approx 95% legibility.  Then practiced writing grocery list with approx. 75% legibility.   Fine Motor Coordination   Fine Motor Coordination Grooved pegs   Grooved pegs with R hand with min-mod difficulty and min cues to avoid compensation/incr in-hand manipulation   Functional Reaching Activities   Mid Level Standing to place small pegs in vertical pegboard with each UE for incr activity tolerance and coordination with min difficulty.  Stood for approx 43mn without rest break or LOB or dropping pegs.                OT Education - 04/04/15 1246    Education Details Reviewed yellow theraband HEP   Person(s) Educated Patient;Child(ren)   Methods Explanation;Demonstration;Tactile cues;Verbal cues   Comprehension Verbalized understanding;Returned demonstration;Verbal cues required;Need further instruction  needed cues for compensation, speed          OT Short Term Goals - 04/04/15 01610   OT SHORT TERM GOAL #1   Title I with HEP.   Baseline check 8/19/6   Time 4   Period Weeks   Status New   OT SHORT TERM GOAL #2   Title Pt will demonstrate ability to write a short paragraph with 95% or better accuracy, and minimal decrease in letter size.   Time 4   Period Weeks    Status New   OT SHORT TERM GOAL #3   Title Pt will perform all basic ADLs modified independently   Time 4   Period Weeks   Status Achieved  per pt report 04/04/15   OT SHORT TERM GOAL #4   Title Pt will improve RUE fine motor coordination as evidenced by performing 9 hole peg test in 35 secs or less.   Time 4   Period Weeks   Status New           OT Long Term Goals - 03/23/15 1702    OT LONG TERM GOAL #1   Title Pt will perform light home management and basic snack prep modified independently demonstrating good safety awareness.   Baseline due 05/22/15   Time 8   Period Weeks   Status New   OT LONG TERM GOAL #2   Title Pt will improve bilateral functional reach score to  10 inches or better, for improved functional reach/ standing balance.   Time 8   Period Weeks   Status New   OT LONG TERM GOAL #3   Title Pt will report increased activity tolerance as evidenced by pt report that she is able to stand for home management/ ADLs for 45 mins-1 hour prior to rest break.   Time 8   Period Weeks   Status New               Plan - 04/04/15 1247    Clinical Impression Statement Pt is progressing towards goals with improved coordination, handwriting, and activity tolerance.  Met STG#3 per pt report.   Plan activity tolerance in standing in prep for IADLs   Consulted and Agree with Plan of Care Patient;Family member/caregiver   Family Member Consulted daughter        Problem List Patient Active Problem List   Diagnosis Date Noted  . Low TSH level 03/10/2015  . Other constipation 03/10/2015  . Constipation 03/08/2015  . Possible Bradycardia 02/22/2015  . Cerebellar hemorrhage, nontraumatic 02/22/2015  . Benign essential HTN 02/21/2015  . 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 Dizziness   . Hypokalemia   . Chronic combined systolic and diastolic congestive heart failure   . Atrial fibrillation with RVR   . HLD (hyperlipidemia)   . Obstructive hydrocephalus    . ICH (intracerebral hemorrhage) 02/05/2015  . COPD bronchitis 12/30/2014  . GERD (gastroesophageal reflux disease) 12/30/2014  . Bladder cancer 01/01/2014  . Glaucoma 01/01/2014  . DCIS (ductal carcinoma in situ) of breast 01/01/2014    [facility] 04/04/2015, 12:53 PM  [facility] [address] [place], [place], [address] Phone: [phone]   Fax:  [place], OTR/L 04/04/2015 12:53 PM

## 2015-04-06 ENCOUNTER — Encounter: Payer: Self-pay | Admitting: Physical Therapy

## 2015-04-06 ENCOUNTER — Ambulatory Visit: Payer: Medicare Other | Admitting: Occupational Therapy

## 2015-04-06 ENCOUNTER — Ambulatory Visit: Payer: Medicare Other | Admitting: Physical Therapy

## 2015-04-06 DIAGNOSIS — R531 Weakness: Secondary | ICD-10-CM | POA: Diagnosis not present

## 2015-04-06 DIAGNOSIS — M6281 Muscle weakness (generalized): Secondary | ICD-10-CM

## 2015-04-06 DIAGNOSIS — R6889 Other general symptoms and signs: Secondary | ICD-10-CM

## 2015-04-06 DIAGNOSIS — R279 Unspecified lack of coordination: Secondary | ICD-10-CM | POA: Diagnosis not present

## 2015-04-06 DIAGNOSIS — IMO0002 Reserved for concepts with insufficient information to code with codable children: Secondary | ICD-10-CM

## 2015-04-06 DIAGNOSIS — R278 Other lack of coordination: Secondary | ICD-10-CM

## 2015-04-06 DIAGNOSIS — R269 Unspecified abnormalities of gait and mobility: Secondary | ICD-10-CM

## 2015-04-06 DIAGNOSIS — I69898 Other sequelae of other cerebrovascular disease: Secondary | ICD-10-CM | POA: Diagnosis not present

## 2015-04-06 NOTE — Therapy (Signed)
[facility] [address] [place], [place], [address] Phone: [phone]   Fax:  [phone]  Occupational Therapy Treatment  Patient Details  Name: [name] MRN: [id number] Date of Birth: [birth year] Referring Provider:  Janith Lima, MD  Encounter Date: 04/06/2015      OT End of Session - 04/06/15 1750    Visit Number 5   Number of Visits 17   Date for OT Re-Evaluation 05/22/15   Authorization Type Medicare   Authorization - Visit Number 5   Authorization - Number of Visits 10   OT Start Time 9093   OT Stop Time 1145   OT Time Calculation (min) 42 min   Activity Tolerance Patient tolerated treatment well   Behavior During Therapy [facility] for tasks assessed/performed      Past Medical History  Diagnosis Date  . History of CHF (congestive heart failure)     POST SURG 2010  . COPD (chronic obstructive pulmonary disease)   . History of breast cancer     DX DCIS IN 2004--  S/P RIGHT MASTECTOMY , NO CHEMORADIATION---  NO RECURRENCE  . Glaucoma of both eyes   . History of atrial fibrillation without current medication 2010    POST SURG 2010--  PER PT NO ISSUES SINCE  . Arthritis   . Dyspnea on exertion   . GERD (gastroesophageal reflux disease)   . Nephrolithiasis   . History of shingles     02/ 2015--  back of neck and left flank--  no residual pain  . History of hypertension   . Recurrent bladder papillary carcinoma first dx 06/ 2014    s/p  turbt's  and instillation mitomycin c (chemo)  . ICH (intracerebral hemorrhage) 02/05/2015    Past Surgical History  Procedure Laterality Date  . Total knee arthroplasty Right 03-22-2009  . Partial mastectomy with needle localization Right 01-14-2003    DCIS  . Total mastectomy Right 02-03-2003    W/ SLN BX  AND  POST 03-01-2003 EVACUATION HEMATOMA  . Removal cyst left hand  2013  . Transthoracic echocardiogram  03-25-2009    MILD LVF/ EF 70-80%/ MILD  INCREASE SYSTOLIC PULMONARY  PRESSURE  . Transurethral resection of bladder tumor with gyrus (turbt-gyrus) N/A 03/26/2013    Procedure: TRANSURETHRAL RESECTION OF BLADDER TUMOR WITH GYRUS (TURBT-GYRUS);  Surgeon: Alexis Frock, MD;  Location: [facility];  Service: Urology;  Laterality: N/A;  . Cystoscopy w/ ureteral stent placement Bilateral 03/26/2013    Procedure: CYSTOSCOPY WITH BILATERAL RETROGRADE PYELOGRAM/URETERAL STENT PLACEMENT;  Surgeon: Alexis Frock, MD;  Location: [facility];  Service: Urology;  Laterality: Bilateral;  . Transurethral resection of bladder tumor with gyrus (turbt-gyrus) N/A 05/13/2013    Procedure: TRANSURETHRAL RESECTION OF BLADDER TUMOR WITH GYRUS (TURBT-GYRUS)  RE-STAGING TRANSURETHRAL RESECTION OF BLADDER TUMOR, LEFT RETROGRADE PYELOGRAM AND STENT EXCHANGE;  Surgeon: Alexis Frock, MD;  Location: [facility];  Service: Urology;  Laterality: N/A;  . Cystoscopy w/ ureteral stent placement Left 05/13/2013    Procedure: CYSTOSCOPY WITH RETROGRADE PYELOGRAM/URETERAL STENT PLACEMENT STENT EXCHANGE;  Surgeon: Alexis Frock, MD;  Location: [facility];  Service: Urology;  Laterality: Left;  . Tubal ligation    . Cataract extraction w/ intraocular lens  implant, bilateral    . Transurethral resection of bladder tumor with gyrus (turbt-gyrus) N/A 08/11/2014    Procedure: TRANSURETHRAL RESECTION OF BLADDER TUMOR WITH GYRUS (TURBT-GYRUS);  Surgeon: Alexis Frock, MD;  Location: [facility];  Service: Urology;  Laterality: N/A;  . Cystoscopy w/ retrogrades Bilateral 08/11/2014    Procedure: CYSTOSCOPY WITH BILATERAL RETROGRADE PYELOGRAM AND MITOMYCIN INSTILLATION;  Surgeon: Alexis Frock, MD;  Location: [facility];  Service: Urology;  Laterality: Bilateral;  . Craniectomy N/A 02/05/2015    Procedure: SUBOCCIPITAL CRANIECTOMY EVACUATION OF HEMATOMA;  Surgeon: Consuella Lose, MD;  Location:  [facility];  Service: Neurosurgery;  Laterality: N/A;    There were no vitals filed for this visit.  Visit Diagnosis:  Weakness due to cerebrovascular accident  Decreased functional activity tolerance  Decreased coordination      Subjective Assessment - 04/06/15 1118    Patient is accompained by: Family member   Patient Stated Goals to get back to normal   Currently in Pain? No/denies   Multiple Pain Sites No      Treatment; Reviewed yellow theraband exercises as pt reports they are hard and arms fatigue, 10 reps each, min v.c. Rest break in between sets, therapist monitored O2 sats, and they remained greater than or equal to 93%. Arm bike x 6 mins level 3 for conditioning, pt was able to maintain 30 RPMs. Theraputic activities, standing to copy small peg design for incr. activiy tolerance, standing balance, and fine motor coordination, supervision, provided. Pt correctly copied design with minimal drops/ difficulty with RUE.                         OT Education - 04/06/15 1120    Education provided Yes   Education Details Yellow theraband HEP   Person(s) Educated Patient;Child(ren)   Methods Explanation;Demonstration   Comprehension Verbalized understanding;Returned demonstration          OT Short Term Goals - 04/04/15 2103    OT SHORT TERM GOAL #1   Title I with HEP.   Baseline check 8/19/6   Time 4   Period Weeks   Status New   OT SHORT TERM GOAL #2   Title Pt will demonstrate ability to write a short paragraph with 95% or better accuracy, and minimal decrease in letter size.   Time 4   Period Weeks   Status New   OT SHORT TERM GOAL #3   Title Pt will perform all basic ADLs modified independently   Time 4   Period Weeks   Status Achieved  per pt report 04/04/15   OT SHORT TERM GOAL #4   Title Pt will improve RUE fine motor coordination as evidenced by performing 9 hole peg test in 35 secs or less.   Time 4   Period Weeks   Status New            OT Long Term Goals - 03/23/15 1702    OT LONG TERM GOAL #1   Title Pt will perform light home management and basic snack prep modified independently demonstrating good safety awareness.   Baseline due 05/22/15   Time 8   Period Weeks   Status New   OT LONG TERM GOAL #2   Title Pt will improve bilateral functional reach score to 10 inches or better, for improved functional reach/ standing balance.   Time 8   Period Weeks   Status New   OT LONG TERM GOAL #3   Title Pt will report increased activity tolerance as evidenced by pt report that she is able to stand for home management/ ADLs for 45 mins-1 hour prior to rest break.   Time 8  Period Weeks   Status New               Plan - 04/06/15 1122    Clinical Impression Statement Pt is progressing towards goals yet is limited by decreased strength and endurance.   Pt will benefit from skilled therapeutic intervention in order to improve on the following deficits (Retired) Abnormal gait;Decreased coordination;Decreased range of motion;Impaired flexibility;Decreased endurance;Decreased safety awareness;Decreased activity tolerance;Decreased balance;Impaired UE functional use;Decreased cognition;Decreased mobility;Decreased strength;Impaired vision/preception   Rehab Potential Good   OT Frequency 2x / week   OT Duration 8 weeks   OT Treatment/Interventions Self-care/ADL training;Moist Heat;Fluidtherapy;DME and/or AE instruction;Splinting;Patient/family education;Balance training;Therapeutic exercises;Contrast Bath;Ultrasound;Therapeutic exercise;Therapeutic activities;Cognitive remediation/compensation;Passive range of motion;Functional Mobility Training;Neuromuscular education;Electrical Stimulation;Energy conservation;Manual Therapy;Visual/perceptual remediation/compensation   Plan activity tolerance in standing in prep for IADLS   OT Home Exercise Plan Issued:  03/29/15 yellow theraband HEP   Consulted and Agree with Plan  of Care Patient;Family member/caregiver   Family Member Consulted daughter        Problem List Patient Active Problem List   Diagnosis Date Noted  . Low TSH level 03/10/2015  . Other constipation 03/10/2015  . Constipation 03/08/2015  . Possible Bradycardia 02/22/2015  . Cerebellar hemorrhage, nontraumatic 02/22/2015  . Benign essential HTN 02/21/2015  . 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 Dizziness   . Hypokalemia   . Chronic combined systolic and diastolic congestive heart failure   . Atrial fibrillation with RVR   . HLD (hyperlipidemia)   . Obstructive hydrocephalus   . ICH (intracerebral hemorrhage) 02/05/2015  . COPD bronchitis 12/30/2014  . GERD (gastroesophageal reflux disease) 12/30/2014  . Bladder cancer 01/01/2014  . Glaucoma 01/01/2014  . DCIS (ductal carcinoma in situ) of breast 01/01/2014    RINE,KATHRYN 04/06/2015, 5:51 PM Theone Murdoch, OTR/L [phone] Phone: [phone] 5:52 PM 04/06/2015 [facility] [address] [place], [place], [address] Phone: [phone]   Fax:  [phone]

## 2015-04-06 NOTE — Therapy (Signed)
[facility] [address] [place], [place], [address] Phone: [phone]   Fax:  [phone]  Physical Therapy Treatment  Patient Details  Name: [name] MRN: [id number] Date of Birth: February 11, 1935 Referring Provider:  Janith Lima, MD  Encounter Date: 04/06/2015      PT End of Session - 04/06/15 1022    Visit Number 4   Number of Visits 16   Date for PT Re-Evaluation 05/25/15   Authorization Type G code every 10th visit   PT Start Time 1015   PT Stop Time 1100   PT Time Calculation (min) 45 min   Equipment Utilized During Treatment Gait belt   Activity Tolerance Patient tolerated treatment well;Patient limited by fatigue   Behavior During Therapy [facility] for tasks assessed/performed      Past Medical History  Diagnosis Date  . History of CHF (congestive heart failure)     POST SURG 2010  . COPD (chronic obstructive pulmonary disease)   . History of breast cancer     DX DCIS IN 2004--  S/P RIGHT MASTECTOMY , NO CHEMORADIATION---  NO RECURRENCE  . Glaucoma of both eyes   . History of atrial fibrillation without current medication 2010    POST SURG 2010--  PER PT NO ISSUES SINCE  . Arthritis   . Dyspnea on exertion   . GERD (gastroesophageal reflux disease)   . Nephrolithiasis   . History of shingles     02/ 2015--  back of neck and left flank--  no residual pain  . History of hypertension   . Recurrent bladder papillary carcinoma first dx 06/ 2014    s/p  turbt's  and instillation mitomycin c (chemo)  . ICH (intracerebral hemorrhage) 02/05/2015    Past Surgical History  Procedure Laterality Date  . Total knee arthroplasty Right 03-22-2009  . Partial mastectomy with needle localization Right 01-14-2003    DCIS  . Total mastectomy Right 02-03-2003    W/ SLN BX  AND  POST 03-01-2003 EVACUATION HEMATOMA  . Removal cyst left hand  2013  . Transthoracic echocardiogram  03-25-2009    MILD LVF/ EF  70-80%/ MILD INCREASE SYSTOLIC PULMONARY  PRESSURE  . Transurethral resection of bladder tumor with gyrus (turbt-gyrus) N/A 03/26/2013    Procedure: TRANSURETHRAL RESECTION OF BLADDER TUMOR WITH GYRUS (TURBT-GYRUS);  Surgeon: Alexis Frock, MD;  Location: [facility];  Service: Urology;  Laterality: N/A;  . Cystoscopy w/ ureteral stent placement Bilateral 03/26/2013    Procedure: CYSTOSCOPY WITH BILATERAL RETROGRADE PYELOGRAM/URETERAL STENT PLACEMENT;  Surgeon: Alexis Frock, MD;  Location: [facility];  Service: Urology;  Laterality: Bilateral;  . Transurethral resection of bladder tumor with gyrus (turbt-gyrus) N/A 05/13/2013    Procedure: TRANSURETHRAL RESECTION OF BLADDER TUMOR WITH GYRUS (TURBT-GYRUS)  RE-STAGING TRANSURETHRAL RESECTION OF BLADDER TUMOR, LEFT RETROGRADE PYELOGRAM AND STENT EXCHANGE;  Surgeon: Alexis Frock, MD;  Location: [facility];  Service: Urology;  Laterality: N/A;  . Cystoscopy w/ ureteral stent placement Left 05/13/2013    Procedure: CYSTOSCOPY WITH RETROGRADE PYELOGRAM/URETERAL STENT PLACEMENT STENT EXCHANGE;  Surgeon: Alexis Frock, MD;  Location: [facility];  Service: Urology;  Laterality: Left;  . Tubal ligation    . Cataract extraction w/ intraocular lens  implant, bilateral    . Transurethral resection of bladder tumor with gyrus (turbt-gyrus) N/A 08/11/2014    Procedure: TRANSURETHRAL RESECTION OF BLADDER TUMOR WITH GYRUS (TURBT-GYRUS);  Surgeon: Alexis Frock, MD;  Location: [facility];  Service: Urology;  Laterality: N/A;  . Cystoscopy w/ retrogrades Bilateral 08/11/2014    Procedure: CYSTOSCOPY WITH BILATERAL RETROGRADE PYELOGRAM AND MITOMYCIN INSTILLATION;  Surgeon: Alexis Frock, MD;  Location[facility];  Service: Urology;  Laterality: Bilateral;  . Craniectomy N/A 02/05/2015    Procedure: SUBOCCIPITAL CRANIECTOMY EVACUATION OF HEMATOMA;  Surgeon: Consuella Lose,  MD;  Location: [facility] [facility];  Service: Neurosurgery;  Laterality: N/A;    There were no vitals filed for this visit.  Visit Diagnosis:  Decreased coordination  Weakness due to cerebrovascular accident  Decreased functional activity tolerance  Abnormality of gait  Generalized muscle weakness      Subjective Assessment - 04/06/15 1020    Subjective No new complaints. No falls or pain to report. New exercises going okay, no issues except they are challenging.    Currently in Pain? No/denies   Pain Score 0-No pain     Treatment: Gait 340 feet x 1 with min assist, no device to HHA toward end of gait trial. Cues for posture, step length, increased base of support and for step placement with intital contact to decrease scissoring. SaO2 97% on RA. Dyspnea 3/4. Recovered breathing with rest 1-2 minutes. 230 feet x 1 with min assist, no device to HHA. Cues as stated. SaO2 96%, dyspnea 2/4.  Neuro re-ed: Single leg stance activities Alternating fwd and cross taps to 4 inch box, to 6 inch box, 10 reps each with bil legs  On air ex Feet apart: eyes closed no head movements, eyes closed with head nods and shakes, min assist with cues on posture and weight shift for balance.  Blue foam beam: min assist for balance with cues on posture and ex form Standing with feet across beam Alternating fwd foot taps Alternating bwd foot taps   seated with feet across beam  sit<>stands x 10 reps with min assist and cues for forward weight shifting to assist with standing  Beam in parallel bars Side stepping left<>right x 3 laps each way, light UE support on bars with stand by assist and cues on posture and ex form.         PT Short Term Goals - 03/24/15 1203    PT SHORT TERM GOAL #1   Title Patient will return demo HEP for LE strength, balance and endurance tasks.   Baseline Target date 04/24/2015   Time 4   Period Weeks   PT SHORT TERM GOAL #2   Title Improve Berg from 34/56 up to 38/56 to  demo decreased risk for falls.   Baseline Target date 04/24/2015   Time 4   Period Weeks   PT SHORT TERM GOAL #3   Title Improve gait speed from 1.99 ft/sec up to 2.4 ft/sec to demo improving functional mobility.   Baseline Target date 04/24/2015   Time 4   Period Weeks   PT SHORT TERM GOAL #4   Title Tolerate ambulation x 5 minutes nonstop with least restrictive assistive device for improved household and limited community mobility.   Baseline Target date 04/24/2015   Time 4   Period Weeks   PT SHORT TERM GOAL #5   Title Patient will ambulate without a device x 200 ft with close supervision for improved household mobility.   Baseline Target date 04/24/2015   Time 4   Period Weeks           PT Long Term Goals - 03/24/15 1202    PT LONG TERM GOAL #  1   Title Verbalize understanding of stroke risk factors and warning signs.   Baseline Target date 05/25/2015   Time 8   Period Weeks   PT LONG TERM GOAL #2   Title Improve Berg from 34/56 upt o 44/56 to demo decreasing risk for falls.   Baseline Target date 05/25/2015   Time 8   Period Weeks   PT LONG TERM GOAL #3   Title Improve gait speed from 1.99 ft/sec up to 2.62 ft/sec to demo transition to limited community ambulator classification of gait.   Baseline Target date 05/25/2015   Time 8   Period Weeks   PT LONG TERM GOAL #4   Title Negotiate 4 steps x 4 reps (to make flight of steps) modified indep with one handrail (baseline=CGA).   Baseline Target date 05/25/2015   Time 8   Period Weeks   PT LONG TERM GOAL #5   Title Improve SIS mobility from 60.1% up to 75% to demo improved functional mobility.   Baseline Target date 05/25/2015   Time 8   Period Weeks           Plan - 04/06/15 1022    Clinical Impression Statement Pt continues to make steady progress with increased gait tolerance and activity tolerance. SaO2 stable through out session, >/= 96%. Pt still becomes short of breath easily, however recovers quickly with rest  breaks.    Pt will benefit from skilled therapeutic intervention in order to improve on the following deficits Abnormal gait;Decreased balance;Decreased activity tolerance;Decreased mobility;Decreased strength;Postural dysfunction;Impaired flexibility;Decreased endurance;Difficulty walking   Rehab Potential Good   Clinical Impairments Affecting Rehab Potential h/o COPD with shortness of breath noted   PT Frequency 2x / week   PT Duration 8 weeks   PT Treatment/Interventions Functional mobility training;Stair training;Gait training;Patient/family education;Therapeutic activities;Therapeutic exercise;Balance training;Neuromuscular re-education;ADLs/Self Care Home Management;Energy conservation   PT Next Visit Plan monitor O2 level, gait without device, balance activities. Progress HEP as needed.   Consulted and Agree with Plan of Care Patient;Family member/caregiver   Family Member Consulted daughter        Problem List Patient Active Problem List   Diagnosis Date Noted  . Low TSH level 03/10/2015  . Other constipation 03/10/2015  . Constipation 03/08/2015  . Possible Bradycardia 02/22/2015  . Cerebellar hemorrhage, nontraumatic 02/22/2015  . Benign essential HTN 02/21/2015  . 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 Dizziness   . Hypokalemia   . Chronic combined systolic and diastolic congestive heart failure   . Atrial fibrillation with RVR   . HLD (hyperlipidemia)   . Obstructive hydrocephalus   . ICH (intracerebral hemorrhage) 02/05/2015  . COPD bronchitis 12/30/2014  . GERD (gastroesophageal reflux disease) 12/30/2014  . Bladder cancer 01/01/2014  . Glaucoma 01/01/2014  . DCIS (ductal carcinoma in situ) of breast 01/01/2014    Willow Ora 04/07/2015, 9:27 AM  Willow Ora, PTA, [facility] [address], [place], [place] [address] [phone] 04/07/2015, 9:27 AM

## 2015-04-11 ENCOUNTER — Encounter: Payer: Self-pay | Admitting: Physical Therapy

## 2015-04-11 ENCOUNTER — Ambulatory Visit: Payer: Medicare Other | Admitting: Occupational Therapy

## 2015-04-11 ENCOUNTER — Ambulatory Visit: Payer: Medicare Other | Admitting: Physical Therapy

## 2015-04-11 ENCOUNTER — Encounter: Payer: Self-pay | Admitting: Occupational Therapy

## 2015-04-11 VITALS — BP 121/73 | HR 93

## 2015-04-11 DIAGNOSIS — R6889 Other general symptoms and signs: Secondary | ICD-10-CM | POA: Diagnosis not present

## 2015-04-11 DIAGNOSIS — M6281 Muscle weakness (generalized): Secondary | ICD-10-CM

## 2015-04-11 DIAGNOSIS — IMO0002 Reserved for concepts with insufficient information to code with codable children: Secondary | ICD-10-CM

## 2015-04-11 DIAGNOSIS — R531 Weakness: Secondary | ICD-10-CM | POA: Diagnosis not present

## 2015-04-11 DIAGNOSIS — R269 Unspecified abnormalities of gait and mobility: Secondary | ICD-10-CM

## 2015-04-11 DIAGNOSIS — R279 Unspecified lack of coordination: Secondary | ICD-10-CM

## 2015-04-11 DIAGNOSIS — I69898 Other sequelae of other cerebrovascular disease: Secondary | ICD-10-CM | POA: Diagnosis not present

## 2015-04-11 DIAGNOSIS — R278 Other lack of coordination: Secondary | ICD-10-CM

## 2015-04-11 NOTE — Therapy (Signed)
[facility] [address] [place], [place], [address] Phone: [phone]   Fax:  [phone]  Physical Therapy Treatment  Patient Details  Name: [name] MRN: [id number] Date of Birth: 08-08-1935 Referring Provider:  Janith Lima, MD  Encounter Date: 04/11/2015   04/11/15 1020  PT Visits / Re-Eval  Visit Number 5  Number of Visits 16  Date for PT Re-Evaluation 05/25/15  Authorization  Authorization Type G code every 10th visit  PT Time Calculation  PT Start Time 1017  PT Stop Time 1057  PT Time Calculation (min) 40 min  PT - End of Session  Equipment Utilized During Treatment Gait belt  Activity Tolerance Patient tolerated treatment well;Patient limited by fatigue  Behavior During Therapy [facility] for tasks assessed/performed     Past Medical History  Diagnosis Date  . History of CHF (congestive heart failure)     POST SURG 2010  . COPD (chronic obstructive pulmonary disease)   . History of breast cancer     DX DCIS IN 2004--  S/P RIGHT MASTECTOMY , NO CHEMORADIATION---  NO RECURRENCE  . Glaucoma of both eyes   . History of atrial fibrillation without current medication 2010    POST SURG 2010--  PER PT NO ISSUES SINCE  . Arthritis   . Dyspnea on exertion   . GERD (gastroesophageal reflux disease)   . Nephrolithiasis   . History of shingles     02/ 2015--  back of neck and left flank--  no residual pain  . History of hypertension   . Recurrent bladder papillary carcinoma first dx 06/ 2014    s/p  turbt's  and instillation mitomycin c (chemo)  . ICH (intracerebral hemorrhage) 02/05/2015    Past Surgical History  Procedure Laterality Date  . Total knee arthroplasty Right 03-22-2009  . Partial mastectomy with needle localization Right 01-14-2003    DCIS  . Total mastectomy Right 02-03-2003    W/ SLN BX  AND  POST 03-01-2003 EVACUATION HEMATOMA  . Removal cyst left hand  2013  . Transthoracic  echocardiogram  03-25-2009    MILD LVF/ EF 70-80%/ MILD INCREASE SYSTOLIC PULMONARY  PRESSURE  . Transurethral resection of bladder tumor with gyrus (turbt-gyrus) N/A 03/26/2013    Procedure: TRANSURETHRAL RESECTION OF BLADDER TUMOR WITH GYRUS (TURBT-GYRUS);  Surgeon: Alexis Frock, MD;  Location: [facility];  Service: Urology;  Laterality: N/A;  . Cystoscopy w/ ureteral stent placement Bilateral 03/26/2013    Procedure: CYSTOSCOPY WITH BILATERAL RETROGRADE PYELOGRAM/URETERAL STENT PLACEMENT;  Surgeon: Alexis Frock, MD;  Location: [facility];  Service: Urology;  Laterality: Bilateral;  . Transurethral resection of bladder tumor with gyrus (turbt-gyrus) N/A 05/13/2013    Procedure: TRANSURETHRAL RESECTION OF BLADDER TUMOR WITH GYRUS (TURBT-GYRUS)  RE-STAGING TRANSURETHRAL RESECTION OF BLADDER TUMOR, LEFT RETROGRADE PYELOGRAM AND STENT EXCHANGE;  Surgeon: Alexis Frock, MD;  Location: [facility];  Service: Urology;  Laterality: N/A;  . Cystoscopy w/ ureteral stent placement Left 05/13/2013    Procedure: CYSTOSCOPY WITH RETROGRADE PYELOGRAM/URETERAL STENT PLACEMENT STENT EXCHANGE;  Surgeon: Alexis Frock, MD;  Location: [facility];  Service: Urology;  Laterality: Left;  . Tubal ligation    . Cataract extraction w/ intraocular lens  implant, bilateral    . Transurethral resection of bladder tumor with gyrus (turbt-gyrus) N/A 08/11/2014    Procedure: TRANSURETHRAL RESECTION OF BLADDER TUMOR WITH GYRUS (TURBT-GYRUS);  Surgeon: Alexis Frock, MD;  Location: [facility];  Service: Urology;  Laterality: N/A;  . Cystoscopy w/ retrogrades Bilateral 08/11/2014    Procedure: CYSTOSCOPY WITH BILATERAL RETROGRADE PYELOGRAM AND MITOMYCIN INSTILLATION;  Surgeon: Alexis Frock, MD;  Location: [facility];  Service: Urology;  Laterality: Bilateral;  . Craniectomy N/A 02/05/2015    Procedure: SUBOCCIPITAL CRANIECTOMY EVACUATION  OF HEMATOMA;  Surgeon: Consuella Lose, MD;  Location: [facility];  Service: Neurosurgery;  Laterality: N/A;    There were no vitals filed for this visit.     04/11/15 1019  Symptoms/Limitations  Subjective No new complaints. No falls or pain to report.   Pain Assessment  Currently in Pain? No/denies  Pain Score 0    Visit Diagnosis:  Weakness due to cerebrovascular accident  Decreased functional activity tolerance  Abnormality of gait  Decreased coordination   Treatment: Gait: 230 feet with no AD, min assist with cues on posture and to increase her step length and base of support. SaO2 95% after, dyspnea 3/4.   345 feet with no AD with supervision. Cues on posture at times. SaO2 96%, HR 125 afterwards, dyspnea 2/4.  Neuro Re-ed: Red mats at counter top: High knee marches with pausing each knee up, toe walk, heel walk. All forward/backards x 3 laps each/each way.  Stepping over 4 black bolsters of varied heights x 4 laps forward with 1 UE assist and min guard assist. Cues on posture and increased hip/knee flexion for improved clearance. Stepping over hurdles of varied heights x 4 laps with 1 UE assist and cues as above. Min assist to min guard assist for balance.  Exercise: Scifit x 4 extremities level 1.5 x 8 minutes with goal rpm >/= 50 for strengthening and activity tolerance. SaO2 99%, HR 94 afterwards. Dyspnea 3/4. With rest pt recovered breathing quickly.         PT Short Term Goals - 03/24/15 1203    PT SHORT TERM GOAL #1   Title Patient will return demo HEP for LE strength, balance and endurance tasks.   Baseline Target date 04/24/2015   Time 4   Period Weeks   PT SHORT TERM GOAL #2   Title Improve Berg from 34/56 up to 38/56 to demo decreased risk for falls.   Baseline Target date 04/24/2015   Time 4   Period Weeks   PT SHORT TERM GOAL #3   Title Improve gait speed from 1.99 ft/sec up to 2.4 ft/sec to demo improving functional mobility.   Baseline  Target date 04/24/2015   Time 4   Period Weeks   PT SHORT TERM GOAL #4   Title Tolerate ambulation x 5 minutes nonstop with least restrictive assistive device for improved household and limited community mobility.   Baseline Target date 04/24/2015   Time 4   Period Weeks   PT SHORT TERM GOAL #5   Title Patient will ambulate without a device x 200 ft with close supervision for improved household mobility.   Baseline Target date 04/24/2015   Time 4   Period Weeks           PT Long Term Goals - 03/24/15 1202    PT LONG TERM GOAL #1   Title Verbalize understanding of stroke risk factors and warning signs.   Baseline Target date 05/25/2015   Time 8   Period Weeks   PT LONG TERM GOAL #2   Title Improve Berg from 34/56 upt o 44/56 to demo decreasing risk for falls.   Baseline Target date 05/25/2015   Time  8   Period Weeks   PT LONG TERM GOAL #3   Title Improve gait speed from 1.99 ft/sec up to 2.62 ft/sec to demo transition to limited community ambulator classification of gait.   Baseline Target date 05/25/2015   Time 8   Period Weeks   PT LONG TERM GOAL #4   Title Negotiate 4 steps x 4 reps (to make flight of steps) modified indep with one handrail (baseline=CGA).   Baseline Target date 05/25/2015   Time 8   Period Weeks   PT LONG TERM GOAL #5   Title Improve SIS mobility from 60.1% up to 75% to demo improved functional mobility.   Baseline Target date 05/25/2015   Time 8   Period Weeks        04/11/15 1020  Plan  Clinical Impression Statement Pt with increased steadiness and activity tolerance with gait using AD vs gait without AD. Advised pt to continue to use AD for now for increased safety. Pt making steady progress toward goals.  Pt will benefit from skilled therapeutic intervention in order to improve on the following deficits Abnormal gait;Decreased balance;Decreased activity tolerance;Decreased mobility;Decreased strength;Postural dysfunction;Impaired  flexibility;Decreased endurance;Difficulty walking  Rehab Potential Good  Clinical Impairments Affecting Rehab Potential h/o COPD with shortness of breath noted  PT Frequency 2x / week  PT Duration 8 weeks  PT Treatment/Interventions Functional mobility training;Stair training;Gait training;Patient/family education;Therapeutic activities;Therapeutic exercise;Balance training;Neuromuscular re-education;ADLs/Self Care Home Management;Energy conservation  PT Next Visit Plan monitor O2 level, gait without device, balance activities. Progress HEP as needed.  Consulted and Agree with Plan of Care Patient;Family member/caregiver  Family Member Consulted daughter      Problem List Patient Active Problem List   Diagnosis Date Noted  . Low TSH level 03/10/2015  . Other constipation 03/10/2015  . Constipation 03/08/2015  . Possible Bradycardia 02/22/2015  . Cerebellar hemorrhage, nontraumatic 02/22/2015  . Benign essential HTN 02/21/2015  . 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 Dizziness   . Hypokalemia   . Chronic combined systolic and diastolic congestive heart failure   . Atrial fibrillation with RVR   . HLD (hyperlipidemia)   . Obstructive hydrocephalus   . ICH (intracerebral hemorrhage) 02/05/2015  . COPD bronchitis 12/30/2014  . GERD (gastroesophageal reflux disease) 12/30/2014  . Bladder cancer 01/01/2014  . Glaucoma 01/01/2014  . DCIS (ductal carcinoma in situ) of breast 01/01/2014    Willow Ora 04/12/2015, 6:44 PM  Willow Ora, PTA, [facility] [address] [place], [place] [address] [phone] 04/12/2015, 6:44 PM

## 2015-04-11 NOTE — Therapy (Signed)
[facility] [address] [place], [place], [address] Phone: [phone]   Fax:  [phone]  Occupational Therapy Treatment  Patient Details  Name: [name] MRN: [id number] Date of Birth: [birth year] Referring Provider:  Janith Lima, MD  Encounter Date: 04/11/2015      OT End of Session - 04/11/15 0939    Visit Number 6   Number of Visits 17   Date for OT Re-Evaluation 05/22/15   Authorization Type Medicare   Authorization - Visit Number 6   Authorization - Number of Visits 10   OT Start Time 6433   OT Stop Time 1015   OT Time Calculation (min) 40 min   Activity Tolerance Patient tolerated treatment well   Behavior During Therapy [facility] for tasks assessed/performed      Past Medical History  Diagnosis Date  . History of CHF (congestive heart failure)     POST SURG 2010  . COPD (chronic obstructive pulmonary disease)   . History of breast cancer     DX DCIS IN 2004--  S/P RIGHT MASTECTOMY , NO CHEMORADIATION---  NO RECURRENCE  . Glaucoma of both eyes   . History of atrial fibrillation without current medication 2010    POST SURG 2010--  PER PT NO ISSUES SINCE  . Arthritis   . Dyspnea on exertion   . GERD (gastroesophageal reflux disease)   . Nephrolithiasis   . History of shingles     02/ 2015--  back of neck and left flank--  no residual pain  . History of hypertension   . Recurrent bladder papillary carcinoma first dx 06/ 2014    s/p  turbt's  and instillation mitomycin c (chemo)  . ICH (intracerebral hemorrhage) 02/05/2015    Past Surgical History  Procedure Laterality Date  . Total knee arthroplasty Right 03-22-2009  . Partial mastectomy with needle localization Right 01-14-2003    DCIS  . Total mastectomy Right 02-03-2003    W/ SLN BX  AND  POST 03-01-2003 EVACUATION HEMATOMA  . Removal cyst left hand  2013  . Transthoracic echocardiogram  03-25-2009    MILD LVF/ EF 70-80%/ MILD  INCREASE SYSTOLIC PULMONARY  PRESSURE  . Transurethral resection of bladder tumor with gyrus (turbt-gyrus) N/A 03/26/2013    Procedure: TRANSURETHRAL RESECTION OF BLADDER TUMOR WITH GYRUS (TURBT-GYRUS);  Surgeon: Alexis Frock, MD;  Location: [facility];  Service: Urology;  Laterality: N/A;  . Cystoscopy w/ ureteral stent placement Bilateral 03/26/2013    Procedure: CYSTOSCOPY WITH BILATERAL RETROGRADE PYELOGRAM/URETERAL STENT PLACEMENT;  Surgeon: Alexis Frock, MD;  Location: [facility];  Service: Urology;  Laterality: Bilateral;  . Transurethral resection of bladder tumor with gyrus (turbt-gyrus) N/A 05/13/2013    Procedure: TRANSURETHRAL RESECTION OF BLADDER TUMOR WITH GYRUS (TURBT-GYRUS)  RE-STAGING TRANSURETHRAL RESECTION OF BLADDER TUMOR, LEFT RETROGRADE PYELOGRAM AND STENT EXCHANGE;  Surgeon: Alexis Frock, MD;  Location: [facility];  Service: Urology;  Laterality: N/A;  . Cystoscopy w/ ureteral stent placement Left 05/13/2013    Procedure: CYSTOSCOPY WITH RETROGRADE PYELOGRAM/URETERAL STENT PLACEMENT STENT EXCHANGE;  Surgeon: Alexis Frock, MD;  Location: [facility];  Service: Urology;  Laterality: Left;  . Tubal ligation    . Cataract extraction w/ intraocular lens  implant, bilateral    . Transurethral resection of bladder tumor with gyrus (turbt-gyrus) N/A 08/11/2014    Procedure: TRANSURETHRAL RESECTION OF BLADDER TUMOR WITH GYRUS (TURBT-GYRUS);  Surgeon: Alexis Frock, MD;  Location: [facility];  Service: Urology;  Laterality: N/A;  . Cystoscopy w/ retrogrades Bilateral 08/11/2014    Procedure: CYSTOSCOPY WITH BILATERAL RETROGRADE PYELOGRAM AND MITOMYCIN INSTILLATION;  Surgeon: Alexis Frock, MD;  Location: [facility];  Service: Urology;  Laterality: Bilateral;  . Craniectomy N/A 02/05/2015    Procedure: SUBOCCIPITAL CRANIECTOMY EVACUATION OF HEMATOMA;  Surgeon: Consuella Lose, MD;  Location:  [facility];  Service: Neurosurgery;  Laterality: N/A;    Filed Vitals:   04/11/15 1005  BP: 121/73  Pulse: 93  SpO2: 99%    Visit Diagnosis:  Weakness due to cerebrovascular accident  Decreased functional activity tolerance  Decreased coordination  Generalized muscle weakness      Subjective Assessment - 04/11/15 1005    Subjective  Pt reports doing some exercises.   Patient is accompained by: Family member   Pertinent History see EPIC snap shot   Patient Stated Goals to get back to normal   Currently in Pain? No/denies                      OT Treatments/Exercises (OP) - 04/11/15 0001    ADLs   Home Maintenance Simulated home maintenance activities to retrieve items from various locations using RW.  Pt needed CGA for safety due to impulsivity and RW placement.  Pt needed mod cues to get closer to objects for safety and to use RW or countertop support, and to avoid lifting RW.  Pt reports that she does not use RW much at home.  Encouraged/instructed pt to use RW at home using good safety to prevent falls.  Pt/son verbalized understanding of recommendation, but pt resistant to RW use in the home.  O2 stats 99%.   Writing Practiced writing name/address with approx 95+% legibility and copied sentences with approx 75-85% legibility and incr time.   Fine Motor Coordination   Fine Motor Coordination Purdue Pegboard  with min-mod difficulty/drops with R hand                  OT Short Term Goals - 04/04/15 7544    OT SHORT TERM GOAL #1   Title I with HEP.   Baseline check 8/19/6   Time 4   Period Weeks   Status New   OT SHORT TERM GOAL #2   Title Pt will demonstrate ability to write a short paragraph with 95% or better accuracy, and minimal decrease in letter size.   Time 4   Period Weeks   Status New   OT SHORT TERM GOAL #3   Title Pt will perform all basic ADLs modified independently   Time 4   Period Weeks   Status Achieved  per pt report  04/04/15   OT SHORT TERM GOAL #4   Title Pt will improve RUE fine motor coordination as evidenced by performing 9 hole peg test in 35 secs or less.   Time 4   Period Weeks   Status New           OT Long Term Goals - 03/23/15 1702    OT LONG TERM GOAL #1   Title Pt will perform light home management and basic snack prep modified independently demonstrating good safety awareness.   Baseline due 05/22/15   Time 8   Period Weeks   Status New   OT LONG TERM GOAL #2   Title Pt will improve bilateral functional reach score to 10 inches or better, for improved functional reach/ standing balance.  Time 8   Period Weeks   Status New   OT LONG TERM GOAL #3   Title Pt will report increased activity tolerance as evidenced by pt report that she is able to stand for home management/ ADLs for 45 mins-1 hour prior to rest break.   Time 8   Period Weeks   Status New               Plan - 04/11/15 1006    Clinical Impression Statement Pt is progressing towards goals, but    Plan safety with RW, activity tolerance in standing for IADLs   Consulted and Agree with Plan of Care Patient        Problem List Patient Active Problem List   Diagnosis Date Noted  . Low TSH level 03/10/2015  . Other constipation 03/10/2015  . Constipation 03/08/2015  . Possible Bradycardia 02/22/2015  . Cerebellar hemorrhage, nontraumatic 02/22/2015  . Benign essential HTN 02/21/2015  . 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 Dizziness   . Hypokalemia   . Chronic combined systolic and diastolic congestive heart failure   . Atrial fibrillation with RVR   . HLD (hyperlipidemia)   . Obstructive hydrocephalus   . ICH (intracerebral hemorrhage) 02/05/2015  . COPD bronchitis 12/30/2014  . GERD (gastroesophageal reflux disease) 12/30/2014  . Bladder cancer 01/01/2014  . Glaucoma 01/01/2014  . DCIS (ductal carcinoma in situ) of breast 01/01/2014    [facility] 04/11/2015, 10:29 AM  [facility] [address] [place], [place], [address] Phone: [phone]   Fax:  [place], OTR/L 04/11/2015 10:29 AM

## 2015-04-13 ENCOUNTER — Ambulatory Visit: Payer: Medicare Other | Admitting: Occupational Therapy

## 2015-04-13 ENCOUNTER — Ambulatory Visit: Payer: Medicare Other | Admitting: Physical Therapy

## 2015-04-13 ENCOUNTER — Encounter: Payer: Self-pay | Admitting: Physical Therapy

## 2015-04-13 VITALS — BP 104/67

## 2015-04-13 DIAGNOSIS — M6281 Muscle weakness (generalized): Secondary | ICD-10-CM

## 2015-04-13 DIAGNOSIS — R278 Other lack of coordination: Secondary | ICD-10-CM

## 2015-04-13 DIAGNOSIS — R531 Weakness: Secondary | ICD-10-CM | POA: Diagnosis not present

## 2015-04-13 DIAGNOSIS — I69898 Other sequelae of other cerebrovascular disease: Secondary | ICD-10-CM | POA: Diagnosis not present

## 2015-04-13 DIAGNOSIS — R279 Unspecified lack of coordination: Secondary | ICD-10-CM

## 2015-04-13 DIAGNOSIS — IMO0002 Reserved for concepts with insufficient information to code with codable children: Secondary | ICD-10-CM

## 2015-04-13 DIAGNOSIS — R269 Unspecified abnormalities of gait and mobility: Secondary | ICD-10-CM

## 2015-04-13 DIAGNOSIS — R6889 Other general symptoms and signs: Secondary | ICD-10-CM

## 2015-04-13 NOTE — Therapy (Signed)
[facility] [address] [place], [place], [address] Phone: [phone]   Fax:  [phone]  Physical Therapy Treatment  Patient Details  Name: [name] MRN: [id number] Date of Birth: 02-Jun-1935 Referring Provider:  Janith Lima, MD  Encounter Date: 04/13/2015     04/13/15 1019  PT Visits / Re-Eval  Visit Number 6  Number of Visits 16  Date for PT Re-Evaluation 05/25/15  Authorization  Authorization Type G code every 10th visit  PT Time Calculation  PT Start Time 1016  PT Stop Time 1100  PT Time Calculation (min) 44 min  PT - End of Session  Equipment Utilized During Treatment Gait belt  Activity Tolerance Patient tolerated treatment well;Patient limited by fatigue  Behavior During Therapy [facility] for tasks assessed/performed    Past Medical History  Diagnosis Date  . History of CHF (congestive heart failure)     POST SURG 2010  . COPD (chronic obstructive pulmonary disease)   . History of breast cancer     DX DCIS IN 2004--  S/P RIGHT MASTECTOMY , NO CHEMORADIATION---  NO RECURRENCE  . Glaucoma of both eyes   . History of atrial fibrillation without current medication 2010    POST SURG 2010--  PER PT NO ISSUES SINCE  . Arthritis   . Dyspnea on exertion   . GERD (gastroesophageal reflux disease)   . Nephrolithiasis   . History of shingles     02/ 2015--  back of neck and left flank--  no residual pain  . History of hypertension   . Recurrent bladder papillary carcinoma first dx 06/ 2014    s/p  turbt's  and instillation mitomycin c (chemo)  . ICH (intracerebral hemorrhage) 02/05/2015    Past Surgical History  Procedure Laterality Date  . Total knee arthroplasty Right 03-22-2009  . Partial mastectomy with needle localization Right 01-14-2003    DCIS  . Total mastectomy Right 02-03-2003    W/ SLN BX  AND  POST 03-01-2003 EVACUATION HEMATOMA  . Removal cyst left hand  2013  . Transthoracic  echocardiogram  03-25-2009    MILD LVF/ EF 70-80%/ MILD INCREASE SYSTOLIC PULMONARY  PRESSURE  . Transurethral resection of bladder tumor with gyrus (turbt-gyrus) N/A 03/26/2013    Procedure: TRANSURETHRAL RESECTION OF BLADDER TUMOR WITH GYRUS (TURBT-GYRUS);  Surgeon: Alexis Frock, MD;  Location: [facility];  Service: Urology;  Laterality: N/A;  . Cystoscopy w/ ureteral stent placement Bilateral 03/26/2013    Procedure: CYSTOSCOPY WITH BILATERAL RETROGRADE PYELOGRAM/URETERAL STENT PLACEMENT;  Surgeon: Alexis Frock, MD;  Location: [facility];  Service: Urology;  Laterality: Bilateral;  . Transurethral resection of bladder tumor with gyrus (turbt-gyrus) N/A 05/13/2013    Procedure: TRANSURETHRAL RESECTION OF BLADDER TUMOR WITH GYRUS (TURBT-GYRUS)  RE-STAGING TRANSURETHRAL RESECTION OF BLADDER TUMOR, LEFT RETROGRADE PYELOGRAM AND STENT EXCHANGE;  Surgeon: Alexis Frock, MD;  Location: [facility];  Service: Urology;  Laterality: N/A;  . Cystoscopy w/ ureteral stent placement Left 05/13/2013    Procedure: CYSTOSCOPY WITH RETROGRADE PYELOGRAM/URETERAL STENT PLACEMENT STENT EXCHANGE;  Surgeon: Alexis Frock, MD;  Location: [facility]  Service: Urology;  Laterality: Left;  . Tubal ligation    . Cataract extraction w/ intraocular lens  implant, bilateral    . Transurethral resection of bladder tumor with gyrus (turbt-gyrus) N/A 08/11/2014    Procedure: TRANSURETHRAL RESECTION OF BLADDER TUMOR WITH GYRUS (TURBT-GYRUS);  Surgeon: Alexis Frock, MD;  Location: [facility];  Service: Urology;  Laterality: N/A;  . Cystoscopy w/ retrogrades Bilateral 08/11/2014    Procedure: CYSTOSCOPY WITH BILATERAL RETROGRADE PYELOGRAM AND MITOMYCIN INSTILLATION;  Surgeon: Alexis Frock, MD;  Location: [facility];  Service: Urology;  Laterality: Bilateral;  . Craniectomy N/A 02/05/2015    Procedure: SUBOCCIPITAL CRANIECTOMY EVACUATION  OF HEMATOMA;  Surgeon: Consuella Lose, MD;  Location: [facility];  Service: Neurosurgery;  Laterality: N/A;    04/13/15 1018  Symptoms/Limitations  Subjective No new complaints. No falls or pain to report.   Pain Assessment  Currently in Pain? No/denies  Pain Score 0    There were no vitals filed for this visit.  Visit Diagnosis:  Weakness due to cerebrovascular accident  Decreased functional activity tolerance  Decreased coordination  Generalized muscle weakness  Abnormality of gait   Treatment: Gait: 230 feet x 1 with no AD, min guard assist to min assist.  Cues on posture and to correct gait deviations (increase base of support. Increase step/stride length and for increased foot clearance with gait). SaO2 93% after, HR 98  345 feet x 1 with no AD, min assist, cues as above. SaO2 88% after, HR 91. With rest SaO2 recovered to >/= 94%.  Neuro Re-ed: Single leg stance activities with tall cones: alternating fwd toe taps, cross toe taps, fwd double toe taps, cross double toe taps, and flip over/up x 10 each bil legs with min to mod assist. Cues on posture and weight shifting. Cues to slow down and gain stance stability before lifting non stance leg.  Standing with feet across blue foam beam: alternating fwd heel taps and bwd toe taps x 10 reps each with min assist for balance.         PT Short Term Goals - 03/24/15 1203    PT SHORT TERM GOAL #1   Title Patient will return demo HEP for LE strength, balance and endurance tasks.   Baseline Target date 04/24/2015   Time 4   Period Weeks   PT SHORT TERM GOAL #2   Title Improve Berg from 34/56 up to 38/56 to demo decreased risk for falls.   Baseline Target date 04/24/2015   Time 4   Period Weeks   PT SHORT TERM GOAL #3   Title Improve gait speed from 1.99 ft/sec up to 2.4 ft/sec to demo improving functional mobility.   Baseline Target date 04/24/2015   Time 4   Period Weeks   PT SHORT TERM GOAL #4   Title Tolerate  ambulation x 5 minutes nonstop with least restrictive assistive device for improved household and limited community mobility.   Baseline Target date 04/24/2015   Time 4   Period Weeks   PT SHORT TERM GOAL #5   Title Patient will ambulate without a device x 200 ft with close supervision for improved household mobility.   Baseline Target date 04/24/2015   Time 4   Period Weeks           PT Long Term Goals - 03/24/15 1202    PT LONG TERM GOAL #1   Title Verbalize understanding of stroke risk factors and warning signs.   Baseline Target date 05/25/2015   Time 8   Period Weeks   PT LONG TERM GOAL #2   Title Improve Berg from 34/56 upt o 44/56 to demo decreasing risk for falls.   Baseline Target date 05/25/2015   Time 8   Period Weeks   PT LONG TERM GOAL #3   Title  Improve gait speed from 1.99 ft/sec up to 2.62 ft/sec to demo transition to limited community ambulator classification of gait.   Baseline Target date 05/25/2015   Time 8   Period Weeks   PT LONG TERM GOAL #4   Title Negotiate 4 steps x 4 reps (to make flight of steps) modified indep with one handrail (baseline=CGA).   Baseline Target date 05/25/2015   Time 8   Period Weeks   PT LONG TERM GOAL #5   Title Improve SIS mobility from 60.1% up to 75% to demo improved functional mobility.   Baseline Target date 05/25/2015   Time 8   Period Weeks           Plan - 04/14/15 1955    Clinical Impression Statement Pt continues to need increased assist with gait without AD and continue to recommend pt use AD with gait for safety. Pt does fatique/become short of breath easily with activity. She also recovers well. Making steady progress toward goals.   Pt will benefit from skilled therapeutic intervention in order to improve on the following deficits Abnormal gait;Decreased balance;Decreased activity tolerance;Decreased mobility;Decreased strength;Postural dysfunction;Impaired flexibility;Decreased endurance;Difficulty walking    Rehab Potential Good   Clinical Impairments Affecting Rehab Potential h/o COPD with shortness of breath noted   PT Frequency 2x / week   PT Duration 8 weeks   PT Treatment/Interventions Functional mobility training;Stair training;Gait training;Patient/family education;Therapeutic activities;Therapeutic exercise;Balance training;Neuromuscular re-education;ADLs/Self Care Home Management;Energy conservation   PT Next Visit Plan monitor O2 level, gait without device, balance activities. Progress HEP as needed.   Consulted and Agree with Plan of Care Patient;Family member/caregiver   Family Member Consulted daughter        Problem List Patient Active Problem List   Diagnosis Date Noted  . Low TSH level 03/10/2015  . Other constipation 03/10/2015  . Constipation 03/08/2015  . Possible Bradycardia 02/22/2015  . Cerebellar hemorrhage, nontraumatic 02/22/2015  . Benign essential HTN 02/21/2015  . 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 Dizziness   . Hypokalemia   . Chronic combined systolic and diastolic congestive heart failure   . Atrial fibrillation with RVR   . HLD (hyperlipidemia)   . Obstructive hydrocephalus   . ICH (intracerebral hemorrhage) 02/05/2015  . COPD bronchitis 12/30/2014  . GERD (gastroesophageal reflux disease) 12/30/2014  . Bladder cancer 01/01/2014  . Glaucoma 01/01/2014  . DCIS (ductal carcinoma in situ) of breast 01/01/2014    Willow Ora 04/14/2015, 7:57 PM  Willow Ora, PTA, [facility] [address], [place], [place] [address] [phone] 04/14/2015, 7:58 PM

## 2015-04-13 NOTE — Therapy (Addendum)
[facility] [address] [place], [place], [address] Phone: [phone]   Fax:  [phone]  Occupational Therapy Treatment  Patient Details  Name: [name] MRN: [id number] Date of Birth: 1935-06-13 Referring Provider:  Janith Lima, MD  Encounter Date: 04/13/2015      OT End of Session - 04/13/15 0940    Visit Number 7   Number of Visits 17   Date for OT Re-Evaluation 05/22/15   Authorization - Visit Number 7   Authorization - Number of Visits 10   OT Start Time [phone]   OT Stop Time 1015   OT Time Calculation (min) 38 min   Activity Tolerance Patient tolerated treatment well   Behavior During Therapy [facility] for tasks assessed/performed      Past Medical History  Diagnosis Date  . History of CHF (congestive heart failure)     POST SURG 2010  . COPD (chronic obstructive pulmonary disease)   . History of breast cancer     DX DCIS IN 2004--  S/P RIGHT MASTECTOMY , NO CHEMORADIATION---  NO RECURRENCE  . Glaucoma of both eyes   . History of atrial fibrillation without current medication 2010    POST SURG 2010--  PER PT NO ISSUES SINCE  . Arthritis   . Dyspnea on exertion   . GERD (gastroesophageal reflux disease)   . Nephrolithiasis   . History of shingles     02/ 2015--  back of neck and left flank--  no residual pain  . History of hypertension   . Recurrent bladder papillary carcinoma first dx 06/ 2014    s/p  turbt's  and instillation mitomycin c (chemo)  . ICH (intracerebral hemorrhage) 02/05/2015    Past Surgical History  Procedure Laterality Date  . Total knee arthroplasty Right 03-22-2009  . Partial mastectomy with needle localization Right 01-14-2003    DCIS  . Total mastectomy Right 02-03-2003    W/ SLN BX  AND  POST 03-01-2003 EVACUATION HEMATOMA  . Removal cyst left hand  2013  . Transthoracic echocardiogram  03-25-2009    MILD LVF/ EF 70-80%/ MILD INCREASE SYSTOLIC PULMONARY   PRESSURE  . Transurethral resection of bladder tumor with gyrus (turbt-gyrus) N/A 03/26/2013    Procedure: TRANSURETHRAL RESECTION OF BLADDER TUMOR WITH GYRUS (TURBT-GYRUS);  Surgeon: Alexis Frock, MD;  Location: [facility];  Service: Urology;  Laterality: N/A;  . Cystoscopy w/ ureteral stent placement Bilateral 03/26/2013    Procedure: CYSTOSCOPY WITH BILATERAL RETROGRADE PYELOGRAM/URETERAL STENT PLACEMENT;  Surgeon: Alexis Frock, MD;  Location: [facility];  Service: Urology;  Laterality: Bilateral;  . Transurethral resection of bladder tumor with gyrus (turbt-gyrus) N/A 05/13/2013    Procedure: TRANSURETHRAL RESECTION OF BLADDER TUMOR WITH GYRUS (TURBT-GYRUS)  RE-STAGING TRANSURETHRAL RESECTION OF BLADDER TUMOR, LEFT RETROGRADE PYELOGRAM AND STENT EXCHANGE;  Surgeon: Alexis Frock, MD;  Location: [facility];  Service: Urology;  Laterality: N/A;  . Cystoscopy w/ ureteral stent placement Left 05/13/2013    Procedure: CYSTOSCOPY WITH RETROGRADE PYELOGRAM/URETERAL STENT PLACEMENT STENT EXCHANGE;  Surgeon: Alexis Frock, MD;  Location: [facility];  Service: Urology;  Laterality: Left;  . Tubal ligation    . Cataract extraction w/ intraocular lens  implant, bilateral    . Transurethral resection of bladder tumor with gyrus (turbt-gyrus) N/A 08/11/2014    Procedure: TRANSURETHRAL RESECTION OF BLADDER TUMOR WITH GYRUS (TURBT-GYRUS);  Surgeon: Alexis Frock, MD;  Location: [facility];  Service: Urology;  Laterality: N/A;  . Cystoscopy w/ retrogrades Bilateral 08/11/2014    Procedure: CYSTOSCOPY WITH BILATERAL RETROGRADE PYELOGRAM AND MITOMYCIN INSTILLATION;  Surgeon: Alexis Frock, MD;  Location[facility];  Service: Urology;  Laterality: Bilateral;  . Craniectomy N/A 02/05/2015    Procedure: SUBOCCIPITAL CRANIECTOMY EVACUATION OF HEMATOMA;  Surgeon: Consuella Lose, MD;  Location: [facility];  Service:  Neurosurgery;  Laterality: N/A;    Filed Vitals:   04/13/15 1002  BP: 104/67    Visit Diagnosis:  Weakness due to cerebrovascular accident  Decreased functional activity tolerance  Decreased coordination  Generalized muscle weakness       Treatment: Reviewed walker safety with pt/ daughter and discussed fall risk. Pt ambulated to cabinets and simulated putting groceries in overhead cabinets with bilateral UE's. Pt required a rest break as O2 sats decreased, after O2 sats rebounded dynamic functional  reaching with lateral weight shifts, min facilitation/ v.c.(O2 sats decreased to 86% but rebounded to 92% following rest and pursed lip breathing). Pt practiced walker safety with cueing from therapist. Pt reports she is not consistently using walker at home, despite therapist recommendation.                       OT Short Term Goals - 04/04/15 8466    OT SHORT TERM GOAL #1   Title I with HEP.   Baseline check 8/19/6   Time 4   Period Weeks   Status New   OT SHORT TERM GOAL #2   Title Pt will demonstrate ability to write a short paragraph with 95% or better accuracy, and minimal decrease in letter size.   Time 4   Period Weeks   Status New   OT SHORT TERM GOAL #3   Title Pt will perform all basic ADLs modified independently   Time 4   Period Weeks   Status Achieved  per pt report 04/04/15   OT SHORT TERM GOAL #4   Title Pt will improve RUE fine motor coordination as evidenced by performing 9 hole peg test in 35 secs or less.   Time 4   Period Weeks   Status New           OT Long Term Goals - 03/23/15 1702    OT LONG TERM GOAL #1   Title Pt will perform light home management and basic snack prep modified independently demonstrating good safety awareness.   Baseline due 05/22/15   Time 8   Period Weeks   Status New   OT LONG TERM GOAL #2   Title Pt will improve bilateral functional reach score to 10 inches or better, for improved functional reach/  standing balance.   Time 8   Period Weeks   Status New   OT LONG TERM GOAL #3   Title Pt will report increased activity tolerance as evidenced by pt report that she is able to stand for home management/ ADLs for 45 mins-1 hour prior to rest break.   Time 8   Period Weeks   Status New               Plan - 04/13/15 0935    Pt will benefit from skilled therapeutic intervention in order to improve on the following deficits (Retired) Abnormal gait;Decreased coordination;Decreased range of motion;Impaired flexibility;Decreased endurance;Decreased safety awareness;Decreased activity tolerance;Decreased balance;Impaired UE functional use;Decreased cognition;Decreased mobility;Decreased strength;Impaired vision/preception   OT Home Exercise Plan Issued:  03/29/15 yellow theraband HEP   Consulted and Agree  with Plan of Care Patient        Problem List Patient Active Problem List   Diagnosis Date Noted  . Low TSH level 03/10/2015  . Other constipation 03/10/2015  . Constipation 03/08/2015  . Possible Bradycardia 02/22/2015  . Cerebellar hemorrhage, nontraumatic 02/22/2015  . Benign essential HTN 02/21/2015  . 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 Dizziness   . Hypokalemia   . Chronic combined systolic and diastolic congestive heart failure   . Atrial fibrillation with RVR   . HLD (hyperlipidemia)   . Obstructive hydrocephalus   . ICH (intracerebral hemorrhage) 02/05/2015  . COPD bronchitis 12/30/2014  . GERD (gastroesophageal reflux disease) 12/30/2014  . Bladder cancer 01/01/2014  . Glaucoma 01/01/2014  . DCIS (ductal carcinoma in situ) of breast 01/01/2014    RINE,KATHRYN 04/13/2015, 5:23 PM Theone Murdoch, OTR/L [phone] Phone: [phone] 5:23 PM 04/13/2015 [facility] [address] [place], [place], [address] Phone: [phone]   Fax:  [phone]

## 2015-04-15 ENCOUNTER — Ambulatory Visit: Payer: Medicare Other

## 2015-04-15 ENCOUNTER — Inpatient Hospital Stay: Payer: Medicare Other | Admitting: Physical Medicine & Rehabilitation

## 2015-04-18 ENCOUNTER — Ambulatory Visit: Payer: Medicare Other | Admitting: Rehabilitative and Restorative Service Providers"

## 2015-04-18 ENCOUNTER — Ambulatory Visit: Payer: Medicare Other | Admitting: Occupational Therapy

## 2015-04-18 VITALS — HR 96

## 2015-04-18 DIAGNOSIS — R269 Unspecified abnormalities of gait and mobility: Secondary | ICD-10-CM | POA: Diagnosis not present

## 2015-04-18 DIAGNOSIS — R279 Unspecified lack of coordination: Secondary | ICD-10-CM | POA: Diagnosis not present

## 2015-04-18 DIAGNOSIS — IMO0002 Reserved for concepts with insufficient information to code with codable children: Secondary | ICD-10-CM

## 2015-04-18 DIAGNOSIS — R278 Other lack of coordination: Secondary | ICD-10-CM

## 2015-04-18 DIAGNOSIS — M6281 Muscle weakness (generalized): Secondary | ICD-10-CM

## 2015-04-18 DIAGNOSIS — R6889 Other general symptoms and signs: Secondary | ICD-10-CM | POA: Diagnosis not present

## 2015-04-18 DIAGNOSIS — R531 Weakness: Secondary | ICD-10-CM | POA: Diagnosis not present

## 2015-04-18 DIAGNOSIS — I69898 Other sequelae of other cerebrovascular disease: Secondary | ICD-10-CM | POA: Diagnosis not present

## 2015-04-18 NOTE — Therapy (Signed)
[facility] [address] [place], [place], [address] Phone: [phone]   Fax:  [phone]  Physical Therapy Treatment  Patient Details  Name: [name] MRN: [id number] Date of Birth: 1934-10-27 Referring Provider:  Janith Lima, MD  Encounter Date: 04/18/2015      PT End of Session - 04/18/15 1025    Visit Number 7   Number of Visits 16   Date for PT Re-Evaluation 05/25/15   Authorization Type G code every 10th visit   PT Start Time 1020   PT Stop Time 1100   PT Time Calculation (min) 40 min   Equipment Utilized During Treatment Gait belt   Activity Tolerance Patient tolerated treatment well;Patient limited by fatigue   Behavior During Therapy [facility] for tasks assessed/performed      Past Medical History  Diagnosis Date  . History of CHF (congestive heart failure)     POST SURG 2010  . COPD (chronic obstructive pulmonary disease)   . History of breast cancer     DX DCIS IN 2004--  S/P RIGHT MASTECTOMY , NO CHEMORADIATION---  NO RECURRENCE  . Glaucoma of both eyes   . History of atrial fibrillation without current medication 2010    POST SURG 2010--  PER PT NO ISSUES SINCE  . Arthritis   . Dyspnea on exertion   . GERD (gastroesophageal reflux disease)   . Nephrolithiasis   . History of shingles     02/ 2015--  back of neck and left flank--  no residual pain  . History of hypertension   . Recurrent bladder papillary carcinoma first dx 06/ 2014    s/p  turbt's  and instillation mitomycin c (chemo)  . ICH (intracerebral hemorrhage) 02/05/2015    Past Surgical History  Procedure Laterality Date  . Total knee arthroplasty Right 03-22-2009  . Partial mastectomy with needle localization Right 01-14-2003    DCIS  . Total mastectomy Right 02-03-2003    W/ SLN BX  AND  POST 03-01-2003 EVACUATION HEMATOMA  . Removal cyst left hand  2013  . Transthoracic echocardiogram  03-25-2009    MILD LVF/ EF  70-80%/ MILD INCREASE SYSTOLIC PULMONARY  PRESSURE  . Transurethral resection of bladder tumor with gyrus (turbt-gyrus) N/A 03/26/2013    Procedure: TRANSURETHRAL RESECTION OF BLADDER TUMOR WITH GYRUS (TURBT-GYRUS);  Surgeon: Alexis Frock, MD;  Location: [facility];  Service: Urology;  Laterality: N/A;  . Cystoscopy w/ ureteral stent placement Bilateral 03/26/2013    Procedure: CYSTOSCOPY WITH BILATERAL RETROGRADE PYELOGRAM/URETERAL STENT PLACEMENT;  Surgeon: Alexis Frock, MD;  Location: [facility];  Service: Urology;  Laterality: Bilateral;  . Transurethral resection of bladder tumor with gyrus (turbt-gyrus) N/A 05/13/2013    Procedure: TRANSURETHRAL RESECTION OF BLADDER TUMOR WITH GYRUS (TURBT-GYRUS)  RE-STAGING TRANSURETHRAL RESECTION OF BLADDER TUMOR, LEFT RETROGRADE PYELOGRAM AND STENT EXCHANGE;  Surgeon: Alexis Frock, MD;  Location: [facility];  Service: Urology;  Laterality: N/A;  . Cystoscopy w/ ureteral stent placement Left 05/13/2013    Procedure: CYSTOSCOPY WITH RETROGRADE PYELOGRAM/URETERAL STENT PLACEMENT STENT EXCHANGE;  Surgeon: Alexis Frock, MD;  Location: [facility];  Service: Urology;  Laterality: Left;  . Tubal ligation    . Cataract extraction w/ intraocular lens  implant, bilateral    . Transurethral resection of bladder tumor with gyrus (turbt-gyrus) N/A 08/11/2014    Procedure: TRANSURETHRAL RESECTION OF BLADDER TUMOR WITH GYRUS (TURBT-GYRUS);  Surgeon: Alexis Frock, MD;  Location: [facility];  Service: Urology;  Laterality: N/A;  . Cystoscopy w/ retrogrades Bilateral 08/11/2014    Procedure: CYSTOSCOPY WITH BILATERAL RETROGRADE PYELOGRAM AND MITOMYCIN INSTILLATION;  Surgeon: Alexis Frock, MD;  Location: [facility];  Service: Urology;  Laterality: Bilateral;  . Craniectomy N/A 02/05/2015    Procedure: SUBOCCIPITAL CRANIECTOMY EVACUATION OF HEMATOMA;  Surgeon: Consuella Lose,  MD;  Location: [facility];  Service: Neurosurgery;  Laterality: N/A;    There were no vitals filed for this visit.  Visit Diagnosis:  Abnormality of gait  Generalized muscle weakness      Subjective Assessment - 04/18/15 1022    Subjective Patient using RW some in the house.  The patient did not exercise a lot because she has been busy getting out of the house.   Patient Stated Goals Return to prior level of function (cane at night due to spinal curve), no device during the day   Currently in Pain? No/denies      Gait: Ambulation with RW x 5 minutes nonstop modified indep on level surfaces 31M with RW=2.52 ft/sec Ambulation without a device x 230 feet with CGA to supervision level x 2 reps Ambulation with SPC x 230 feet with SBA to CGA and cues on correct technique   NEUROMUSCULAR RE-EDUCATION: Berg=42/56.     [facility] PT Assessment - 04/18/15 1043    Standardized Balance Assessment   Standardized Balance Assessment Berg Balance Test   Berg Balance Test   Sit to Stand Able to stand without using hands and stabilize independently   Standing Unsupported Able to stand safely 2 minutes   Sitting with Back Unsupported but Feet Supported on Floor or Stool Able to sit safely and securely 2 minutes   Stand to Sit Sits safely with minimal use of hands   Transfers Able to transfer safely, minor use of hands   Standing Unsupported with Eyes Closed Able to stand 10 seconds safely   Standing Ubsupported with Feet Together Able to place feet together independently and stand for 1 minute with supervision   From Standing, Reach Forward with Outstretched Arm Can reach forward >12 cm safely (5")   From Standing Position, Pick up Object from Floor Able to pick up shoe, needs supervision   From Standing Position, Turn to Look Behind Over each Shoulder Turn sideways only but maintains balance   Turn 360 Degrees Needs close supervision or verbal cueing   Standing Unsupported, Alternately Place Feet  on Step/Stool Able to complete 4 steps without aid or supervision   Standing Unsupported, One Foot in Front Able to plae foot ahead of the other independently and hold 30 seconds   Standing on One Leg Tries to lift leg/unable to hold 3 seconds but remains standing independently   Total Score 42   High Level Balance   High Level Balance Comments 42/56 improved from 34/56 at eval          PT Short Term Goals - 04/18/15 1035    PT SHORT TERM GOAL #1   Title Patient will return demo HEP for LE strength, balance and endurance tasks.   Baseline Target date 04/24/2015   Time 4   Period Weeks   PT SHORT TERM GOAL #2   Title Improve Berg from 34/56 up to 38/56 to demo decreased risk for falls.   Baseline Improved from 34/56 up to 42/56 on 04/18/2015.   Time 4   Period Weeks   Status Achieved   PT SHORT TERM GOAL #3  Title Improve gait speed from 1.99 ft/sec up to 2.4 ft/sec to demo improving functional mobility.   Baseline Met on 04/18/2015 improving from 1.99 ft/sec up to 2.52 ft/sec.   Time 4   Period Weeks   Status Achieved   PT SHORT TERM GOAL #4   Title Tolerate ambulation x 5 minutes nonstop with least restrictive assistive device for improved household and limited community mobility.   Baseline Met on 04/18/2015 with cues on breathing and standing rest break   Time 4   Period Weeks   Status Achieved   PT SHORT TERM GOAL #5   Title Patient will ambulate without a device x 200 ft with close supervision for improved household mobility.   Baseline Pt requires CGA on 04/18/2015   Time 4   Period Weeks   Status Partially Met           PT Long Term Goals - 03/24/15 1202    PT LONG TERM GOAL #1   Title Verbalize understanding of stroke risk factors and warning signs.   Baseline Target date 05/25/2015   Time 8   Period Weeks   PT LONG TERM GOAL #2   Title Improve Berg from 34/56 upt o 44/56 to demo decreasing risk for falls.   Baseline Target date 05/25/2015   Time 8   Period  Weeks   PT LONG TERM GOAL #3   Title Improve gait speed from 1.99 ft/sec up to 2.62 ft/sec to demo transition to limited community ambulator classification of gait.   Baseline Target date 05/25/2015   Time 8   Period Weeks   PT LONG TERM GOAL #4   Title Negotiate 4 steps x 4 reps (to make flight of steps) modified indep with one handrail (baseline=CGA).   Baseline Target date 05/25/2015   Time 8   Period Weeks   PT LONG TERM GOAL #5   Title Improve SIS mobility from 60.1% up to 75% to demo improved functional mobility.   Baseline Target date 05/25/2015   Time 8   Period Weeks               Plan - 04/18/15 2229    Clinical Impression Statement The patient has met 4/5 STGs.  PT to review HEP to check remaining STG.   PT Next Visit Plan Practice gait indoors with SPC on L side, balance activities, check HEP and remaining STG #1.  Progress to LTGs.   Consulted and Agree with Plan of Care Patient;Family member/caregiver   Family Member Consulted daughter        Problem List Patient Active Problem List   Diagnosis Date Noted  . Low TSH level 03/10/2015  . Other constipation 03/10/2015  . Constipation 03/08/2015  . Possible Bradycardia 02/22/2015  . Cerebellar hemorrhage, nontraumatic 02/22/2015  . Benign essential HTN 02/21/2015  . 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 Dizziness   . Hypokalemia   . Chronic combined systolic and diastolic congestive heart failure   . Atrial fibrillation with RVR   . HLD (hyperlipidemia)   . Obstructive hydrocephalus   . ICH (intracerebral hemorrhage) 02/05/2015  . COPD bronchitis 12/30/2014  . GERD (gastroesophageal reflux disease) 12/30/2014  . Bladder cancer 01/01/2014  . Glaucoma 01/01/2014  . DCIS (ductal carcinoma in situ) of breast 01/01/2014    Diahn Waidelich, PT 04/18/2015, 10:39 PM  [facility] [address] [place], [place], [address] Phone:  [phone]   Fax:  [phone]

## 2015-04-18 NOTE — Therapy (Signed)
[facility] [address] [place], [place], [address] Phone: [phone]   Fax:  [phone]  Occupational Therapy Treatment  Patient Details  Name: [name] MRN: [id number] Date of Birth: Sep 12, 1934 Referring Provider:  Janith Lima, MD  Encounter Date: 04/18/2015      OT End of Session - 04/18/15 1145    Visit Number 8   Number of Visits 17   Date for OT Re-Evaluation 05/22/15   Authorization Type Medicare   Authorization - Visit Number 8   Authorization - Number of Visits 10   OT Start Time 0934   OT Stop Time 1015   OT Time Calculation (min) 41 min   Activity Tolerance Patient tolerated treatment well   Behavior During Therapy Impulsive      Past Medical History  Diagnosis Date  . History of CHF (congestive heart failure)     POST SURG 2010  . COPD (chronic obstructive pulmonary disease)   . History of breast cancer     DX DCIS IN 2004--  S/P RIGHT MASTECTOMY , NO CHEMORADIATION---  NO RECURRENCE  . Glaucoma of both eyes   . History of atrial fibrillation without current medication 2010    POST SURG 2010--  PER PT NO ISSUES SINCE  . Arthritis   . Dyspnea on exertion   . GERD (gastroesophageal reflux disease)   . Nephrolithiasis   . History of shingles     02/ 2015--  back of neck and left flank--  no residual pain  . History of hypertension   . Recurrent bladder papillary carcinoma first dx 06/ 2014    s/p  turbt's  and instillation mitomycin c (chemo)  . ICH (intracerebral hemorrhage) 02/05/2015    Past Surgical History  Procedure Laterality Date  . Total knee arthroplasty Right 03-22-2009  . Partial mastectomy with needle localization Right 01-14-2003    DCIS  . Total mastectomy Right 02-03-2003    W/ SLN BX  AND  POST 03-01-2003 EVACUATION HEMATOMA  . Removal cyst left hand  2013  . Transthoracic echocardiogram  03-25-2009    MILD LVF/ EF 70-80%/ MILD INCREASE SYSTOLIC PULMONARY   PRESSURE  . Transurethral resection of bladder tumor with gyrus (turbt-gyrus) N/A 03/26/2013    Procedure: TRANSURETHRAL RESECTION OF BLADDER TUMOR WITH GYRUS (TURBT-GYRUS);  Surgeon: Alexis Frock, MD;  Location[facility];  Service: Urology;  Laterality: N/A;  . Cystoscopy w/ ureteral stent placement Bilateral 03/26/2013    Procedure: CYSTOSCOPY WITH BILATERAL RETROGRADE PYELOGRAM/URETERAL STENT PLACEMENT;  Surgeon: Alexis Frock, MD;  Location: [facility];  Service: Urology;  Laterality: Bilateral;  . Transurethral resection of bladder tumor with gyrus (turbt-gyrus) N/A 05/13/2013    Procedure: TRANSURETHRAL RESECTION OF BLADDER TUMOR WITH GYRUS (TURBT-GYRUS)  RE-STAGING TRANSURETHRAL RESECTION OF BLADDER TUMOR, LEFT RETROGRADE PYELOGRAM AND STENT EXCHANGE;  Surgeon: Alexis Frock, MD;  Location: [facility];  Service: Urology;  Laterality: N/A;  . Cystoscopy w/ ureteral stent placement Left 05/13/2013    Procedure: CYSTOSCOPY WITH RETROGRADE PYELOGRAM/URETERAL STENT PLACEMENT STENT EXCHANGE;  Surgeon: Alexis Frock, MD;  Location: [facility];  Service: Urology;  Laterality: Left;  . Tubal ligation    . Cataract extraction w/ intraocular lens  implant, bilateral    . Transurethral resection of bladder tumor with gyrus (turbt-gyrus) N/A 08/11/2014    Procedure: TRANSURETHRAL RESECTION OF BLADDER TUMOR WITH GYRUS (TURBT-GYRUS);  Surgeon: Alexis Frock, MD;  Location: [facility];  Service: Urology;  Laterality: N/A;  . Cystoscopy w/ retrogrades Bilateral 08/11/2014    Procedure: CYSTOSCOPY WITH BILATERAL RETROGRADE PYELOGRAM AND MITOMYCIN INSTILLATION;  Surgeon: Alexis Frock, MD;  Location: [facility];  Service: Urology;  Laterality: Bilateral;  . Craniectomy N/A 02/05/2015    Procedure: SUBOCCIPITAL CRANIECTOMY EVACUATION OF HEMATOMA;  Surgeon: Consuella Lose, MD;  Location: [facility];  Service:  Neurosurgery;  Laterality: N/A;    Filed Vitals:   04/18/15 1003  Pulse: 96  SpO2: 98%    Visit Diagnosis:  Weakness due to cerebrovascular accident  Decreased functional activity tolerance  Decreased coordination  Generalized muscle weakness      Subjective Assessment - 04/18/15 0936    Subjective  nothing new   Patient is accompained by: Family member   Pertinent History see EPIC snap shot   Patient Stated Goals to get back to normal   Currently in Pain? No/denies                      OT Treatments/Exercises (OP) - 04/18/15 0001    ADLs   Home Maintenance Simulated home maintenance activities to retrieve items from various locations using RW.  Pt needed close supervision due to impulsivity and RW use (min v.c. for safety).  Pt reports that she is not using RW at home.  Pt encouraged to use RW for safety and prevent falls.  Then, functional reaching in low cabinet, overhead, and behind for weightshifts in standing with counter support with no LOB and v.c. to avoid hitting open cabinet door.     Writing Copied 3 lines of text with approx 85% legibility, but legibility affected by impulsivity.  Then using distal finger control sheet for making "o" due to difficulty with making "o" and loops with min difficulty and min v.c. to slow down/impulsivity.   Fine Motor Coordination   Grooved pegs with R hand with min-mod difficulty with in-hand manipulation         checked 9-hole peg test:  See score below.         OT Short Term Goals - 04/18/15 5277    OT SHORT TERM GOAL #1   Title I with HEP.   Baseline check 8/19/6   Time 4   Period Weeks   Status New   OT SHORT TERM GOAL #2   Title Pt will demonstrate ability to write a short paragraph with 95% or better accuracy, and minimal decrease in letter size.   Time 4   Period Weeks   Status New   OT SHORT TERM GOAL #3   Title Pt will perform all basic ADLs modified independently   Time 4   Period Weeks    Status Achieved  per pt report 04/04/15   OT SHORT TERM GOAL #4   Title Pt will improve RUE fine motor coordination as evidenced by performing 9 hole peg test in 35 secs or less.   Time 4   Period Weeks   Status On-going  04/18/15:  41.43sec           OT Long Term Goals - 03/23/15 1702    OT LONG TERM GOAL #1   Title Pt will perform light home management and basic snack prep modified independently demonstrating good safety awareness.   Baseline due 05/22/15   Time 8   Period Weeks   Status New   OT LONG TERM GOAL #2   Title Pt will improve bilateral functional reach score to  10 inches or better, for improved functional reach/ standing balance.   Time 8   Period Weeks   Status New   OT LONG TERM GOAL #3   Title Pt will report increased activity tolerance as evidenced by pt report that she is able to stand for home management/ ADLs for 45 mins-1 hour prior to rest break.   Time 8   Period Weeks   Status New               Plan - 04/18/15 0941    Clinical Impression Statement Pt is progressing towards goals, but continues to need cueing for safety and impulsivity at times (but improved from last week).   Plan check remaining STGs   OT Home Exercise Plan Issued:  03/29/15 yellow theraband HEP   Consulted and Agree with Plan of Care Patient;Family member/caregiver   Family Member Consulted daughter        Problem List Patient Active Problem List   Diagnosis Date Noted  . Low TSH level 03/10/2015  . Other constipation 03/10/2015  . Constipation 03/08/2015  . Possible Bradycardia 02/22/2015  . Cerebellar hemorrhage, nontraumatic 02/22/2015  . Benign essential HTN 02/21/2015  . 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 Dizziness   . Hypokalemia   . Chronic combined systolic and diastolic congestive heart failure   . Atrial fibrillation with RVR   . HLD (hyperlipidemia)   . Obstructive hydrocephalus   . ICH (intracerebral hemorrhage) 02/05/2015  . COPD  bronchitis 12/30/2014  . GERD (gastroesophageal reflux disease) 12/30/2014  . Bladder cancer 01/01/2014  . Glaucoma 01/01/2014  . DCIS (ductal carcinoma in situ) of breast 01/01/2014    [facility] 04/18/2015, 11:46 AM  [facility] [address] [place], [place], [address] Phone: [phone]   Fax:  [place], OTR/L 04/18/2015 11:47 AM

## 2015-04-20 ENCOUNTER — Ambulatory Visit: Payer: Medicare Other | Admitting: Occupational Therapy

## 2015-04-20 ENCOUNTER — Ambulatory Visit: Payer: Medicare Other | Admitting: Physical Therapy

## 2015-04-20 ENCOUNTER — Encounter: Payer: Self-pay | Admitting: Physical Therapy

## 2015-04-20 DIAGNOSIS — R278 Other lack of coordination: Secondary | ICD-10-CM

## 2015-04-20 DIAGNOSIS — M6281 Muscle weakness (generalized): Secondary | ICD-10-CM | POA: Diagnosis not present

## 2015-04-20 DIAGNOSIS — R279 Unspecified lack of coordination: Secondary | ICD-10-CM

## 2015-04-20 DIAGNOSIS — R269 Unspecified abnormalities of gait and mobility: Secondary | ICD-10-CM | POA: Diagnosis not present

## 2015-04-20 DIAGNOSIS — R6889 Other general symptoms and signs: Secondary | ICD-10-CM

## 2015-04-20 DIAGNOSIS — IMO0002 Reserved for concepts with insufficient information to code with codable children: Secondary | ICD-10-CM

## 2015-04-20 DIAGNOSIS — R531 Weakness: Secondary | ICD-10-CM | POA: Diagnosis not present

## 2015-04-20 DIAGNOSIS — I69898 Other sequelae of other cerebrovascular disease: Secondary | ICD-10-CM | POA: Diagnosis not present

## 2015-04-20 NOTE — Therapy (Signed)
[facility] [address] [place], [place], [address] Phone: [phone]   Fax:  [phone]  Physical Therapy Treatment  Patient Details  Name: [name] MRN: [id number] Date of Birth: [birth year] Referring Provider:  Janith Lima, MD  Encounter Date: 04/20/2015   04/20/15 1014  PT Visits / Re-Eval  Visit Number 8  Number of Visits 16  Date for PT Re-Evaluation 05/25/15  Authorization  Authorization Type G code every 10th visit  PT Time Calculation  PT Start Time 1015  PT Stop Time 1055  PT Time Calculation (min) 40 min  PT - End of Session  Equipment Utilized During Treatment Gait belt  Activity Tolerance Patient tolerated treatment well;Patient limited by fatigue  Behavior During Therapy [facility] for tasks assessed/performed     Past Medical History  Diagnosis Date  . History of CHF (congestive heart failure)     POST SURG 2010  . COPD (chronic obstructive pulmonary disease)   . History of breast cancer     DX DCIS IN 2004--  S/P RIGHT MASTECTOMY , NO CHEMORADIATION---  NO RECURRENCE  . Glaucoma of both eyes   . History of atrial fibrillation without current medication 2010    POST SURG 2010--  PER PT NO ISSUES SINCE  . Arthritis   . Dyspnea on exertion   . GERD (gastroesophageal reflux disease)   . Nephrolithiasis   . History of shingles     02/ 2015--  back of neck and left flank--  no residual pain  . History of hypertension   . Recurrent bladder papillary carcinoma first dx 06/ 2014    s/p  turbt's  and instillation mitomycin c (chemo)  . ICH (intracerebral hemorrhage) 02/05/2015    Past Surgical History  Procedure Laterality Date  . Total knee arthroplasty Right 03-22-2009  . Partial mastectomy with needle localization Right 01-14-2003    DCIS  . Total mastectomy Right 02-03-2003    W/ SLN BX  AND  POST 03-01-2003 EVACUATION HEMATOMA  . Removal cyst left hand  2013  . Transthoracic  echocardiogram  03-25-2009    MILD LVF/ EF 70-80%/ MILD INCREASE SYSTOLIC PULMONARY  PRESSURE  . Transurethral resection of bladder tumor with gyrus (turbt-gyrus) N/A 03/26/2013    Procedure: TRANSURETHRAL RESECTION OF BLADDER TUMOR WITH GYRUS (TURBT-GYRUS);  Surgeon: Alexis Frock, MD;  Location: [facility];  Service: Urology;  Laterality: N/A;  . Cystoscopy w/ ureteral stent placement Bilateral 03/26/2013    Procedure: CYSTOSCOPY WITH BILATERAL RETROGRADE PYELOGRAM/URETERAL STENT PLACEMENT;  Surgeon: Alexis Frock, MD;  Location: [facility];  Service: Urology;  Laterality: Bilateral;  . Transurethral resection of bladder tumor with gyrus (turbt-gyrus) N/A 05/13/2013    Procedure: TRANSURETHRAL RESECTION OF BLADDER TUMOR WITH GYRUS (TURBT-GYRUS)  RE-STAGING TRANSURETHRAL RESECTION OF BLADDER TUMOR, LEFT RETROGRADE PYELOGRAM AND STENT EXCHANGE;  Surgeon: Alexis Frock, MD;  Location: [facility];  Service: Urology;  Laterality: N/A;  . Cystoscopy w/ ureteral stent placement Left 05/13/2013    Procedure: CYSTOSCOPY WITH RETROGRADE PYELOGRAM/URETERAL STENT PLACEMENT STENT EXCHANGE;  Surgeon: Alexis Frock, MD;  Location: [facility];  Service: Urology;  Laterality: Left;  . Tubal ligation    . Cataract extraction w/ intraocular lens  implant, bilateral    . Transurethral resection of bladder tumor with gyrus (turbt-gyrus) N/A 08/11/2014    Procedure: TRANSURETHRAL RESECTION OF BLADDER TUMOR WITH GYRUS (TURBT-GYRUS);  Surgeon: Alexis Frock, MD;  Location: [facility];  Service: Urology;  Laterality: N/A;  . Cystoscopy w/ retrogrades Bilateral 08/11/2014    Procedure: CYSTOSCOPY WITH BILATERAL RETROGRADE PYELOGRAM AND MITOMYCIN INSTILLATION;  Surgeon: Alexis Frock, MD;  Location: [facility];  Service: Urology;  Laterality: Bilateral;  . Craniectomy N/A 02/05/2015    Procedure: SUBOCCIPITAL CRANIECTOMY EVACUATION  OF HEMATOMA;  Surgeon: Consuella Lose, MD;  Location: [place] [facility];  Service: Neurosurgery;  Laterality: N/A;    There were no vitals filed for this visit.  Visit Diagnosis:  Weakness due to cerebrovascular accident    Decreased functional activity tolerance    Decreased coordination     Generalized muscle weakness   .  Abnormality of gait         Subjective Assessment - 04/20/15 1021    Subjective No new complaints. No falls or pain to report.   Currently in Pain? No/denies   Pain Score 0-No pain            [facility] Adult PT Treatment/Exercise - 04/20/15 0001    Ambulation/Gait   Ambulation/Gait Assistance 4: Min guard   Ambulation/Gait Assistance Details SaO2>/= 93% after gait today, pt was short of breath. Quick recovery with sitting.   Ambulation Distance (Feet) 115 Feet  x1, 230 x2 reps   Assistive device None   Gait Pattern Step-through pattern;Decreased stride length;Narrow base of support;Scissoring   Ambulation Surface Level;Indoor     Verbally reviewed with pictures (pulled up on computer) all exercises issued to date. Pt reports performing all of them except corner balance exercises.  Exercises:pt needed verbal,tactile and visual cues on correct ex form/technique with all of these Bridge 5 sec hold x 10 reps Hip abdct/er with red band in hook lying, 5 sec hold x 10 reps Clamshell with red band,x 10 reps each side Hip abduct side lying, no resistance, x 10 each side Standing hip abduction, alternating legs x 10 each side with UE support on chair back         PT Short Term Goals - 04/18/15 1035    PT SHORT TERM GOAL #1   Title Patient will return demo HEP for LE strength, balance and endurance tasks.   Baseline Target date 04/24/2015   Time 4   Period Weeks   PT SHORT TERM GOAL #2   Title Improve Berg from 34/56 up to 38/56 to demo decreased risk for falls.   Baseline Improved from 34/56 up to 42/56 on 04/18/2015.   Time 4   Period Weeks    Status Achieved   PT SHORT TERM GOAL #3   Title Improve gait speed from 1.99 ft/sec up to 2.4 ft/sec to demo improving functional mobility.   Baseline Met on 04/18/2015 improving from 1.99 ft/sec up to 2.52 ft/sec.   Time 4   Period Weeks   Status Achieved   PT SHORT TERM GOAL #4   Title Tolerate ambulation x 5 minutes nonstop with least restrictive assistive device for improved household and limited community mobility.   Baseline Met on 04/18/2015 with cues on breathing and standing rest break   Time 4   Period Weeks   Status Achieved   PT SHORT TERM GOAL #5   Title Patient will ambulate without a device x 200 ft with close supervision for improved household mobility.   Baseline Pt requires CGA on 04/18/2015   Time 4   Period Weeks   Status Partially Met           PT Long Term Goals - 03/24/15  [place] #1   Title Verbalize understanding of stroke risk factors and warning signs.   Baseline Target date 05/25/2015   Time 8   Period Weeks   PT LONG TERM GOAL #2   Title Improve Berg from 34/56 upt o 44/56 to demo decreasing risk for falls.   Baseline Target date 05/25/2015   Time 8   Period Weeks   PT LONG TERM GOAL #3   Title Improve gait speed from 1.99 ft/sec up to 2.62 ft/sec to demo transition to limited community ambulator classification of gait.   Baseline Target date 05/25/2015   Time 8   Period Weeks   PT LONG TERM GOAL #4   Title Negotiate 4 steps x 4 reps (to make flight of steps) modified indep with one handrail (baseline=CGA).   Baseline Target date 05/25/2015   Time 8   Period Weeks   PT LONG TERM GOAL #5   Title Improve SIS mobility from 60.1% up to 75% to demo improved functional mobility.   Baseline Target date 05/25/2015   Time 8   Period Weeks          04/20/15 1014  Plan  Clinical Impression Statement Pt partiallly met STG number 1 today. Pt needed cues on correct exercise form/technique with exercises performed today. Pt also  continues to need cues on gait pattern and use of cane (or any device) for safety with gait due to scissoring with gait that puts her a risk of falling. Progress has been slow toward goals.   Pt will benefit from skilled therapeutic intervention in order to improve on the following deficits Abnormal gait;Decreased balance;Decreased activity tolerance;Decreased mobility;Decreased strength;Postural dysfunction;Impaired flexibility;Decreased endurance;Difficulty walking  Rehab Potential Good  Clinical Impairments Affecting Rehab Potential h/o COPD with shortness of breath noted  PT Frequency 2x / week  PT Duration 8 weeks  PT Treatment/Interventions Functional mobility training;Stair training;Gait training;Patient/family education;Therapeutic activities;Therapeutic exercise;Balance training;Neuromuscular re-education;ADLs/Self Care Home Management;Energy conservation  PT Next Visit Plan monitor O2 level, gait without device, balance activities. Progress HEP as needed.  Consulted and Agree with Plan of Care Patient;Family member/caregiver  Family Member Consulted daughter        Problem List Patient Active Problem List   Diagnosis Date Noted  . Low TSH level 03/10/2015  . Other constipation 03/10/2015  . Constipation 03/08/2015  . Possible Bradycardia 02/22/2015  . Cerebellar hemorrhage, nontraumatic 02/22/2015  . Benign essential HTN 02/21/2015  . 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 Dizziness   . Hypokalemia   . Chronic combined systolic and diastolic congestive heart failure   . Atrial fibrillation with RVR   . HLD (hyperlipidemia)   . Obstructive hydrocephalus   . ICH (intracerebral hemorrhage) 02/05/2015  . COPD bronchitis 12/30/2014  . GERD (gastroesophageal reflux disease) 12/30/2014  . Bladder cancer 01/01/2014  . Glaucoma 01/01/2014  . DCIS (ductal carcinoma in situ) of breast 01/01/2014    Willow Ora 04/20/2015, 10:40 AM  Willow Ora, PTA, [facility]  [facility] [address] [place], [place] [address] [phone] 04/21/2015, 7:21 PM

## 2015-04-20 NOTE — Therapy (Signed)
[facility] [address] [place], [place], [address] Phone: [phone]   Fax:  [phone]  Occupational Therapy Treatment  Patient Details  Name: [name] MRN: [id number] Date of Birth: [birth year] Referring Provider:  Janith Lima, MD  Encounter Date: 04/20/2015      OT End of Session - 04/20/15 1012    Visit Number 9   Number of Visits 17   Date for OT Re-Evaluation 05/22/15   Authorization Type Medicare   Authorization Time Period G code next visit!!!   Authorization - Visit Number 9   Authorization - Number of Visits 10   OT Start Time [phone]   OT Stop Time 1015   OT Time Calculation (min) 42 min   Activity Tolerance Patient tolerated treatment well   Behavior During Therapy WFL for tasks assessed/performed      Past Medical History  Diagnosis Date  . History of CHF (congestive heart failure)     POST SURG 2010  . COPD (chronic obstructive pulmonary disease)   . History of breast cancer     DX DCIS IN 2004--  S/P RIGHT MASTECTOMY , NO CHEMORADIATION---  NO RECURRENCE  . Glaucoma of both eyes   . History of atrial fibrillation without current medication 2010    POST SURG 2010--  PER PT NO ISSUES SINCE  . Arthritis   . Dyspnea on exertion   . GERD (gastroesophageal reflux disease)   . Nephrolithiasis   . History of shingles     02/ 2015--  back of neck and left flank--  no residual pain  . History of hypertension   . Recurrent bladder papillary carcinoma first dx 06/ 2014    s/p  turbt's  and instillation mitomycin c (chemo)  . ICH (intracerebral hemorrhage) 02/05/2015    Past Surgical History  Procedure Laterality Date  . Total knee arthroplasty Right 03-22-2009  . Partial mastectomy with needle localization Right 01-14-2003    DCIS  . Total mastectomy Right 02-03-2003    W/ SLN BX  AND  POST 03-01-2003 EVACUATION HEMATOMA  . Removal cyst left hand  2013  . Transthoracic  echocardiogram  03-25-2009    MILD LVF/ EF 70-80%/ MILD INCREASE SYSTOLIC PULMONARY  PRESSURE  . Transurethral resection of bladder tumor with gyrus (turbt-gyrus) N/A 03/26/2013    Procedure: TRANSURETHRAL RESECTION OF BLADDER TUMOR WITH GYRUS (TURBT-GYRUS);  Surgeon: Alexis Frock, MD;  Location: [facility];  Service: Urology;  Laterality: N/A;  . Cystoscopy w/ ureteral stent placement Bilateral 03/26/2013    Procedure: CYSTOSCOPY WITH BILATERAL RETROGRADE PYELOGRAM/URETERAL STENT PLACEMENT;  Surgeon: Alexis Frock, MD;  [facility];  Service: Urology;  Laterality: Bilateral;  . Transurethral resection of bladder tumor with gyrus (turbt-gyrus) N/A 05/13/2013    Procedure: TRANSURETHRAL RESECTION OF BLADDER TUMOR WITH GYRUS (TURBT-GYRUS)  RE-STAGING TRANSURETHRAL RESECTION OF BLADDER TUMOR, LEFT RETROGRADE PYELOGRAM AND STENT EXCHANGE;  Surgeon: Alexis Frock, MD;  Location: [facility];  Service: Urology;  Laterality: N/A;  . Cystoscopy w/ ureteral stent placement Left 05/13/2013    Procedure: CYSTOSCOPY WITH RETROGRADE PYELOGRAM/URETERAL STENT PLACEMENT STENT EXCHANGE;  Surgeon: Alexis Frock, MD;  Location: [facility];  Service: Urology;  Laterality: Left;  . Tubal ligation    . Cataract extraction w/ intraocular lens  implant, bilateral    . Transurethral resection of bladder tumor with gyrus (turbt-gyrus) N/A 08/11/2014    Procedure: TRANSURETHRAL RESECTION OF BLADDER TUMOR WITH GYRUS (TURBT-GYRUS);  Surgeon: Alexis Frock, MD;  Location: [facility];  Service: Urology;  Laterality: N/A;  . Cystoscopy w/ retrogrades Bilateral 08/11/2014    Procedure: CYSTOSCOPY WITH BILATERAL RETROGRADE PYELOGRAM AND MITOMYCIN INSTILLATION;  Surgeon: Alexis Frock, MD;  Location: [facility];  Service: Urology;  Laterality: Bilateral;  . Craniectomy N/A 02/05/2015    Procedure: SUBOCCIPITAL CRANIECTOMY EVACUATION  OF HEMATOMA;  Surgeon: Consuella Lose, MD;  Location: [place] [facility];  Service: Neurosurgery;  Laterality: N/A;    There were no vitals filed for this visit.  Visit Diagnosis:  Weakness due to cerebrovascular accident  Decreased functional activity tolerance  Decreased coordination  Generalized muscle weakness      Subjective Assessment - 04/20/15 0938    Patient is accompained by: Family member   Pertinent History see EPIC snap shot   Patient Stated Goals to get back to normal   Currently in Pain? No/denies         Treatment: Ther ex:Arm bike x 6 mins level 1 for conditioning, followed by review of theraband HEP, yellow. Pt required min v.c./ demonstration for proper positioning and she reports she is not performing at home. Therapist monitored HR and O2 sats throughout tx, pt maintained HR 11 BPM or less and 02 90% or greater. Pt was cued to take rest break at home when SOB/ breathing hard.  Lengthy discussion with pt/ daughter regarding safety. Pt continues to demonstrate significant impulsivity and quick movements when walking with walker or performing other activities. Pt reports not using consistently at home despite fall risk. Pt has been educated in walker safety and the need to slow down. Therapist discussed with pt/ daughter concerns over pt's ability to return to driving safely in the near future due to impulsivity and decreased safety awareness.Self care: Handwriting activity with grossly 80% legibility, pt demonstrates improvements with v.c. from therapist to slow down and for larger letter size.                       OT Short Term Goals - 04/20/15 7591    OT SHORT TERM GOAL #1   Title I with HEP.   Baseline v.c. to slow down   Time 4   Period Weeks   Status Partially Met   OT SHORT TERM GOAL #2   Title Pt will demonstrate ability to write a short paragraph with 95% or better accuracy, and minimal decrease in letter size.   Baseline Pt  demonstrates ability to write with grossly 80% legibility, pt is able to demonstrate improvement with cues from therapist   Time 4   Period Weeks   Status On-going   OT SHORT TERM GOAL #3   Title Pt will perform all basic ADLs modified independently   Time 4   Period Weeks   Status Achieved  per pt report 04/04/15   OT SHORT TERM GOAL #4   Title Pt will improve RUE fine motor coordination as evidenced by performing 9 hole peg test in 35 secs or less.   Time 4   Period Weeks   Status On-going  04/18/15:  41.43sec           OT Long Term Goals - 03/23/15 1702    OT LONG TERM GOAL #1   Title Pt will perform light home management and basic snack prep modified independently demonstrating good safety awareness.   Baseline due 05/22/15   Time 8   Period Weeks   Status New  OT LONG TERM GOAL #2   Title Pt will improve bilateral functional reach score to 10 inches or better, for improved functional reach/ standing balance.   Time 8   Period Weeks   Status New   OT LONG TERM GOAL #3   Title Pt will report increased activity tolerance as evidenced by pt report that she is able to stand for home management/ ADLs for 45 mins-1 hour prior to rest break.   Time 8   Period Weeks   Status New               Plan - 04/20/15 1010    Clinical Impression Statement Pt is progressing towards goals.She continues to need cueing for safety, pt is impulsive at times.   Pt will benefit from skilled therapeutic intervention in order to improve on the following deficits (Retired) Abnormal gait;Decreased coordination;Decreased range of motion;Impaired flexibility;Decreased endurance;Decreased safety awareness;Decreased activity tolerance;Decreased balance;Impaired UE functional use;Decreased cognition;Decreased mobility;Decreased strength;Impaired vision/preception   Rehab Potential Good   OT Frequency 2x / week   OT Duration 8 weeks   OT Treatment/Interventions Self-care/ADL training;Moist  Heat;Fluidtherapy;DME and/or AE instruction;Splinting;Patient/family education;Balance training;Therapeutic exercises;Contrast Bath;Ultrasound;Therapeutic exercise;Therapeutic activities;Cognitive remediation/compensation;Passive range of motion;Functional Mobility Training;Neuromuscular education;Electrical Stimulation;Energy conservation;Manual Therapy;Visual/perceptual remediation/compensation   Plan continue to adress, strength, standing balance, coordination   OT Home Exercise Plan Issued:  03/29/15 yellow theraband HEP   Consulted and Agree with Plan of Care Family member/caregiver        Problem List Patient Active Problem List   Diagnosis Date Noted  . Low TSH level 03/10/2015  . Other constipation 03/10/2015  . Constipation 03/08/2015  . Possible Bradycardia 02/22/2015  . Cerebellar hemorrhage, nontraumatic 02/22/2015  . Benign essential HTN 02/21/2015  . 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 Dizziness   . Hypokalemia   . Chronic combined systolic and diastolic congestive heart failure   . Atrial fibrillation with RVR   . HLD (hyperlipidemia)   . Obstructive hydrocephalus   . ICH (intracerebral hemorrhage) 02/05/2015  . COPD bronchitis 12/30/2014  . GERD (gastroesophageal reflux disease) 12/30/2014  . Bladder cancer 01/01/2014  . Glaucoma 01/01/2014  . DCIS (ductal carcinoma in situ) of breast 01/01/2014    RINE,KATHRYN 04/20/2015, 1:00 PM Theone Murdoch, OTR/L [phone] Phone: [phone] 1:00 PM 04/20/2015 [facility] [address] [place], [place], [address] Phone: [phone]   Fax:  [phone]

## 2015-04-21 ENCOUNTER — Ambulatory Visit: Payer: Medicare Other | Admitting: Cardiology

## 2015-04-21 ENCOUNTER — Telehealth: Payer: Self-pay | Admitting: Occupational Therapy

## 2015-04-21 NOTE — Telephone Encounter (Signed)
Dr. Letta Pate, [name] is receiving OT/PT at our Neuro rehab. She demonstrates significant impulsivity with most activities and decreased safety awareness. Both PT and OT have made recommendations regarding walker use for safety, however despite reinforcement, she continues to demonstrate decreased walker safety and does not use it consistently at home.(PT is going to trial her with a cane for use at home to see if she is more compliant.) I have discussed with pt. and her daughter my recommendation that she does not return to driving at this time due to impulsivity, poor safety awareness and an unwillingness to slow down, which puts her at risk for injury.  I have recommended that she discuss driving with you at her appointment tomorrow as she is very anxious to return to driving.   Sincerely, Theone Murdoch, OTR/L

## 2015-04-22 ENCOUNTER — Encounter: Payer: Medicare Other | Attending: Physical Medicine & Rehabilitation

## 2015-04-22 ENCOUNTER — Encounter: Payer: Self-pay | Admitting: Physical Medicine & Rehabilitation

## 2015-04-22 ENCOUNTER — Telehealth: Payer: Self-pay | Admitting: Cardiology

## 2015-04-22 ENCOUNTER — Ambulatory Visit (HOSPITAL_BASED_OUTPATIENT_CLINIC_OR_DEPARTMENT_OTHER): Payer: Medicare Other | Admitting: Physical Medicine & Rehabilitation

## 2015-04-22 VITALS — BP 112/77 | HR 80

## 2015-04-22 DIAGNOSIS — I4891 Unspecified atrial fibrillation: Secondary | ICD-10-CM | POA: Diagnosis not present

## 2015-04-22 DIAGNOSIS — K219 Gastro-esophageal reflux disease without esophagitis: Secondary | ICD-10-CM | POA: Diagnosis not present

## 2015-04-22 DIAGNOSIS — I614 Nontraumatic intracerebral hemorrhage in cerebellum: Secondary | ICD-10-CM | POA: Diagnosis not present

## 2015-04-22 DIAGNOSIS — Z87891 Personal history of nicotine dependence: Secondary | ICD-10-CM | POA: Insufficient documentation

## 2015-04-22 DIAGNOSIS — J449 Chronic obstructive pulmonary disease, unspecified: Secondary | ICD-10-CM | POA: Diagnosis not present

## 2015-04-22 DIAGNOSIS — R110 Nausea: Secondary | ICD-10-CM | POA: Diagnosis present

## 2015-04-22 DIAGNOSIS — R42 Dizziness and giddiness: Secondary | ICD-10-CM | POA: Diagnosis present

## 2015-04-22 DIAGNOSIS — I69398 Other sequelae of cerebral infarction: Secondary | ICD-10-CM

## 2015-04-22 DIAGNOSIS — R269 Unspecified abnormalities of gait and mobility: Secondary | ICD-10-CM

## 2015-04-22 DIAGNOSIS — Z853 Personal history of malignant neoplasm of breast: Secondary | ICD-10-CM | POA: Insufficient documentation

## 2015-04-22 DIAGNOSIS — I10 Essential (primary) hypertension: Secondary | ICD-10-CM | POA: Diagnosis not present

## 2015-04-22 DIAGNOSIS — I69393 Ataxia following cerebral infarction: Secondary | ICD-10-CM

## 2015-04-22 NOTE — Patient Instructions (Signed)
May discuss with Dr. Stanford Breed whether blood thinners may be helpful in the future. May need neurology and neurosurgery input on this as well.

## 2015-04-22 NOTE — Telephone Encounter (Signed)
Spoke with pt, questions regarding diltiazem answered

## 2015-04-22 NOTE — Progress Notes (Signed)
Subjective:    Patient ID: [name], female    DOB: [birth year], 80 y.o.   MRN: [id number]  HPI 80 year old right-handed female with history of hypertension; transient atrial fibrillation, maintained on aspirin; diastolic congestive heart failure, who lives with her husband, independent prior to admission.  Presented on February 05, 2015, with dizziness, nausea, vomiting as well as bouts of blurred vision. Denies any recent fall or trauma.  CT of the head showed a 4-cm acute hemorrhage that was in the right cerebellar hemisphere with surrounding edema and mass effect on the fourth ventricle as well as lacunar infarct of the right caudate head, appeared to be remote.  Underwent right suboccipital craniotomy, evacuation of hematoma on February 05, 2015, per Dr. Kathyrn Sheriff.  DATE OF ADMISSION:  02/22/2015 DATE OF DISCHARGE:  03/01/2015  Discharge to home ambulating 90 feet min guard assist 1-2 people using walker. Currently ambulating with straight cane no physical assistance. Physical therapy occupational therapy reports reviewed. Notes of impulsivity and decreased safety awareness. Progressing well with strength as well as ambulation distance.  Bathing independently in a shower standing. Dressing and performing other activities of daily living.  Doing some ironing Making desserts  Has seen neurosurgery, was released. No follow-up needed. Turned in WC Pain Inventory Average Pain 0   Pain Right Now 0 My pain is no pain  In the last 24 hours, has pain interfered with the following? General activity 0 Relation with others 0 Enjoyment of life 0 What TIME of day is your pain at its worst? no pain Sleep (in general) no pain  Pain is worse with: no pain Pain improves with: no pain Relief from Meds: no pain  Mobility use a cane use a walker  Function retired  Neuro/Psych No problems in this area  Prior Studies Any changes since last visit?  no  Physicians involved in  your care Any changes since last visit?  no   Family History  Problem Relation Age of Onset  . Colon cancer Mother 6  . Colon cancer Maternal Grandmother   . Breast cancer Maternal Aunt 63  . Cancer Maternal Aunt     ? ovarian  . Breast cancer Other 60    breast cancer, ovarian cancer  . Breast cancer Daughter 47  . Heart attack Neg Hx   . Stroke Paternal Grandmother   . Stroke Maternal Grandmother   . Hypertension Neg Hx    Social History   Social History  . Marital Status: Married    Spouse Name: N/A  . Number of Children: N/A  . Years of Education: N/A   Social History Main Topics  . Smoking status: Former Smoker -- 1.00 packs/day for 40 years    Types: Cigarettes    Quit date: 03/20/2003  . Smokeless tobacco: Never Used  . Alcohol Use: No  . Drug Use: No  . Sexual Activity: Not Asked   Other Topics Concern  . None   Social History Narrative   Past Surgical History  Procedure Laterality Date  . Total knee arthroplasty Right 03-22-2009  . Partial mastectomy with needle localization Right 01-14-2003    DCIS  . Total mastectomy Right 02-03-2003    W/ SLN BX  AND  POST 03-01-2003 EVACUATION HEMATOMA  . Removal cyst left hand  2013  . Transthoracic echocardiogram  03-25-2009    MILD LVF/ EF 70-80%/ MILD INCREASE SYSTOLIC PULMONARY  PRESSURE  . Transurethral resection of bladder tumor with gyrus (turbt-gyrus) N/A 03/26/2013  Procedure: TRANSURETHRAL RESECTION OF BLADDER TUMOR WITH GYRUS (TURBT-GYRUS);  Surgeon: Alexis Frock, MD;  Location: [facility];  Service: Urology;  Laterality: N/A;  . Cystoscopy w/ ureteral stent placement Bilateral 03/26/2013    Procedure: CYSTOSCOPY WITH BILATERAL RETROGRADE PYELOGRAM/URETERAL STENT PLACEMENT;  Surgeon: Alexis Frock, MD;  Location: [facility];  Service: Urology;  Laterality: Bilateral;  . Transurethral resection of bladder tumor with gyrus (turbt-gyrus) N/A 05/13/2013    Procedure:  TRANSURETHRAL RESECTION OF BLADDER TUMOR WITH GYRUS (TURBT-GYRUS)  RE-STAGING TRANSURETHRAL RESECTION OF BLADDER TUMOR, LEFT RETROGRADE PYELOGRAM AND STENT EXCHANGE;  Surgeon: Alexis Frock, MD;  Location[facility];  Service: Urology;  Laterality: N/A;  . Cystoscopy w/ ureteral stent placement Left 05/13/2013    Procedure: CYSTOSCOPY WITH RETROGRADE PYELOGRAM/URETERAL STENT PLACEMENT STENT EXCHANGE;  Surgeon: Alexis Frock, MD;  Location: [facility];  Service: Urology;  Laterality: Left;  . Tubal ligation    . Cataract extraction w/ intraocular lens  implant, bilateral    . Transurethral resection of bladder tumor with gyrus (turbt-gyrus) N/A 08/11/2014    Procedure: TRANSURETHRAL RESECTION OF BLADDER TUMOR WITH GYRUS (TURBT-GYRUS);  Surgeon: Alexis Frock, MD;  Location: [facility];  Service: Urology;  Laterality: N/A;  . Cystoscopy w/ retrogrades Bilateral 08/11/2014    Procedure: CYSTOSCOPY WITH BILATERAL RETROGRADE PYELOGRAM AND MITOMYCIN INSTILLATION;  Surgeon: Alexis Frock, MD;  Location: [facility];  Service: Urology;  Laterality: Bilateral;  . Craniectomy N/A 02/05/2015    Procedure: SUBOCCIPITAL CRANIECTOMY EVACUATION OF HEMATOMA;  Surgeon: Consuella Lose, MD;  Location: [facility];  Service: Neurosurgery;  Laterality: N/A;   Past Medical History  Diagnosis Date  . History of CHF (congestive heart failure)     POST SURG 2010  . COPD (chronic obstructive pulmonary disease)   . History of breast cancer     DX DCIS IN 2004--  S/P RIGHT MASTECTOMY , NO CHEMORADIATION---  NO RECURRENCE  . Glaucoma of both eyes   . History of atrial fibrillation without current medication 2010    POST SURG 2010--  PER PT NO ISSUES SINCE  . Arthritis   . Dyspnea on exertion   . GERD (gastroesophageal reflux disease)   . Nephrolithiasis   . History of shingles     02/ 2015--  back of neck and left flank--  no residual pain  . History  of hypertension   . Recurrent bladder papillary carcinoma first dx 06/ 2014    s/p  turbt's  and instillation mitomycin c (chemo)  . ICH (intracerebral hemorrhage) 02/05/2015   BP 112/77 mmHg  Pulse 80  SpO2   LMP 12/03/2002  Opioid Risk Score:   Fall Risk Score:  `1  Depression screen PHQ 2/9  Depression screen PHQ 2/9 04/22/2015  Decreased Interest 0  Down, Depressed, Hopeless 0  PHQ - 2 Score 0  Altered sleeping 1  Tired, decreased energy 0  Change in appetite 0  Feeling bad or failure about yourself  0  Trouble concentrating 0  Moving slowly or fidgety/restless 0  Suicidal thoughts 0  PHQ-9 Score 1     Review of Systems  Musculoskeletal:       Coordination  All other systems reviewed and are negative.      Objective:   Physical Exam  Constitutional: She is oriented to person, place, and time. She appears well-developed and well-nourished.  Neurological: She is alert and oriented to person, place, and time.  Psychiatric: She has  a normal mood and affect.  Nursing note and vitals reviewed.    Neuro:  Eyes without evidence of nystagmus  Tone is normal without evidence of spasticity Cerebellar exam shows no evidence of ataxia on finger nose finger or heel to shin testing No evidence of trunkal ataxia  Motor strength is 5/5 in bilateral deltoid, biceps, triceps, finger flexors and extensors, wrist flexors and extensors, hip flexors, knee flexors and extensors, ankle dorsiflexors, plantar flexors, invertors and evertors, toe flexors and extensors  Sensory exam is normal to pinprick, proprioception and light touch in the upper and lower limbs   Cranial nerves II- Visual fields are intact to confrontation testing, no blurring of vision III- no evidence of ptosis, upward, downward and medial gaze intact IV- no vertical diplopia or head tilt V- no facial numbness or masseter weakness VI- no pupil abduction weakness VII- no facial droop, good lid closure VII-  normal auditory acuity IX- no pharygeal weakness, gag nl X- no pharyngeal weakness, no hoarseness XI- no trap or SCM weakness XII- no glossal weakness        Assessment & Plan:  1. Right cerebellar hemorrhage. This is in the setting of atrial fibrillation. Question whether initial infarct became hemorrhagic. Currently not on any anticoagulation due to large hemorrhage that required suboccipital craniotomy. 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 She also has a mild right-sided upper extremity dysmetria. She has followed up with neurosurgery as well as cardiology. She may benefit from a neurology follow-up. At some point they may consider putting her on some anticoagulation. This may require neurosurgery input as well.  I would recommend continued outpatient PT and OT We discussed driving. I received a note from her PT and OT indicating that she had poor safety awareness and impulsivity and they did not recommend her going back to driving. I concur with this and discussed this with the patient. I do think at some point she should be able to get back with some limited driving We discussed functional prognosis. Would expect a plateau at around 6 months post  Over half of the 25 min visit was spent counseling and coordinating care.Discussed my findings as well as recommendations with the patient as well as her daughter who is from [place].

## 2015-04-22 NOTE — Telephone Encounter (Signed)
Please call before 11:45,question about her Cardizem.

## 2015-04-25 ENCOUNTER — Ambulatory Visit: Payer: Medicare Other | Admitting: Physical Therapy

## 2015-04-25 ENCOUNTER — Encounter: Payer: Self-pay | Admitting: Physical Therapy

## 2015-04-25 ENCOUNTER — Ambulatory Visit: Payer: Medicare Other | Admitting: Occupational Therapy

## 2015-04-25 DIAGNOSIS — M6281 Muscle weakness (generalized): Secondary | ICD-10-CM

## 2015-04-25 DIAGNOSIS — R278 Other lack of coordination: Secondary | ICD-10-CM

## 2015-04-25 DIAGNOSIS — R269 Unspecified abnormalities of gait and mobility: Secondary | ICD-10-CM | POA: Diagnosis not present

## 2015-04-25 DIAGNOSIS — R6889 Other general symptoms and signs: Secondary | ICD-10-CM

## 2015-04-25 DIAGNOSIS — R279 Unspecified lack of coordination: Secondary | ICD-10-CM

## 2015-04-25 DIAGNOSIS — R531 Weakness: Secondary | ICD-10-CM | POA: Diagnosis not present

## 2015-04-25 DIAGNOSIS — I69898 Other sequelae of other cerebrovascular disease: Secondary | ICD-10-CM | POA: Diagnosis not present

## 2015-04-25 NOTE — Therapy (Signed)
[facility] [address] [place], [place], [address] Phone: [phone]   Fax:  [phone]  Occupational Therapy Treatment  Patient Details  Name: [name] MRN: [id number] Date of Birth: 01-Jun-1935 Referring Provider:  Janith Lima, MD  Encounter Date: 04/25/2015      OT End of Session - 04/25/15 1047    Visit Number 10   Number of Visits 17   Date for OT Re-Evaluation 05/22/15   Authorization Type Medicare   Authorization - Visit Number 10   Authorization - Number of Visits 10   OT Start Time 1020   OT Stop Time 1100   OT Time Calculation (min) 40 min   Activity Tolerance Patient tolerated treatment well   Behavior During Therapy [facility] for tasks assessed/performed      Past Medical History  Diagnosis Date  . History of CHF (congestive heart failure)     POST SURG 2010  . COPD (chronic obstructive pulmonary disease)   . History of breast cancer     DX DCIS IN 2004--  S/P RIGHT MASTECTOMY , NO CHEMORADIATION---  NO RECURRENCE  . Glaucoma of both eyes   . History of atrial fibrillation without current medication 2010    POST SURG 2010--  PER PT NO ISSUES SINCE  . Arthritis   . Dyspnea on exertion   . GERD (gastroesophageal reflux disease)   . Nephrolithiasis   . History of shingles     02/ 2015--  back of neck and left flank--  no residual pain  . History of hypertension   . Recurrent bladder papillary carcinoma first dx 06/ 2014    s/p  turbt's  and instillation mitomycin c (chemo)  . ICH (intracerebral hemorrhage) 02/05/2015    Past Surgical History  Procedure Laterality Date  . Total knee arthroplasty Right 03-22-2009  . Partial mastectomy with needle localization Right 01-14-2003    DCIS  . Total mastectomy Right 02-03-2003    W/ SLN BX  AND  POST 03-01-2003 EVACUATION HEMATOMA  . Removal cyst left hand  2013  . Transthoracic echocardiogram  03-25-2009    MILD LVF/ EF 70-80%/ MILD  INCREASE SYSTOLIC PULMONARY  PRESSURE  . Transurethral resection of bladder tumor with gyrus (turbt-gyrus) N/A 03/26/2013    Procedure: TRANSURETHRAL RESECTION OF BLADDER TUMOR WITH GYRUS (TURBT-GYRUS);  Surgeon: Alexis Frock, MD;  Location: [facility];  Service: Urology;  Laterality: N/A;  . Cystoscopy w/ ureteral stent placement Bilateral 03/26/2013    Procedure: CYSTOSCOPY WITH BILATERAL RETROGRADE PYELOGRAM/URETERAL STENT PLACEMENT;  Surgeon: Alexis Frock, MD;  Location: [facility];  Service: Urology;  Laterality: Bilateral;  . Transurethral resection of bladder tumor with gyrus (turbt-gyrus) N/A 05/13/2013    Procedure: TRANSURETHRAL RESECTION OF BLADDER TUMOR WITH GYRUS (TURBT-GYRUS)  RE-STAGING TRANSURETHRAL RESECTION OF BLADDER TUMOR, LEFT RETROGRADE PYELOGRAM AND STENT EXCHANGE;  Surgeon: Alexis Frock, MD;  Location: [facility];  Service: Urology;  Laterality: N/A;  . Cystoscopy w/ ureteral stent placement Left 05/13/2013    Procedure: CYSTOSCOPY WITH RETROGRADE PYELOGRAM/URETERAL STENT PLACEMENT STENT EXCHANGE;  Surgeon: Alexis Frock, MD;  Location: [facility];  Service: Urology;  Laterality: Left;  . Tubal ligation    . Cataract extraction w/ intraocular lens  implant, bilateral    . Transurethral resection of bladder tumor with gyrus (turbt-gyrus) N/A 08/11/2014    Procedure: TRANSURETHRAL RESECTION OF BLADDER TUMOR WITH GYRUS (TURBT-GYRUS);  Surgeon: Alexis Frock, MD;  Location: [facility];  Service: Urology;  Laterality: N/A;  . Cystoscopy w/ retrogrades Bilateral 08/11/2014    Procedure: CYSTOSCOPY WITH BILATERAL RETROGRADE PYELOGRAM AND MITOMYCIN INSTILLATION;  Surgeon: Alexis Frock, MD;  Location: [facility];  Service: Urology;  Laterality: Bilateral;  . Craniectomy N/A 02/05/2015    Procedure: SUBOCCIPITAL CRANIECTOMY EVACUATION OF HEMATOMA;  Surgeon: Consuella Lose, MD;  Location:  [place] [facility];  Service: Neurosurgery;  Laterality: N/A;    There were no vitals filed for this visit.  Visit Diagnosis:  Decreased functional activity tolerance  Decreased coordination  Generalized muscle weakness      Subjective Assessment - 04/25/15 1021    Subjective  Pt reports that she has not done HEP due to being busy and reports that she is not using cane/RW at home.  "My writing is getting better"  "I feel like my balance is better"   Patient is accompained by: Family member   Pertinent History see EPIC snap shot   Patient Stated Goals to get back to normal   Currently in Pain? No/denies                      OT Treatments/Exercises (OP) - 04/25/15 0001    ADLs   Functional Mobility Standing Functional Test:  R-9inches with CGA, L-9inches with CGA   Writing Practice writing name, address, and grocery list with approx 95% legibility.   Neurological Re-education Exercises   Other Exercises 1 In standing, functional reaching with each UE (with 1lb wt on wrist for incr strength/activity tolerance) to place small pegs in vertical pegboard for incr coordination with min-mod difficulty with in-hand manipulation, particularly with R hand.  Pt with no rest breaks.      Arm bike x72mn level 1 for conditioning without rest.  HR=88bpm, unable to obtain reading for O2 today, but no signs of distress.            OT Education - 04/25/15 1046    Education Details Reviewed recommendations for walker/cane use for incr safety and to minimize fall risk   Person(s) Educated Patient   Methods Explanation   Comprehension Verbalized understanding          OT Short Term Goals - 04/25/15 1058    OT SHORT TERM GOAL #1   Title I with HEP.   Baseline v.c. to slow down   Time 4   Period Weeks   Status Partially Met   OT SHORT TERM GOAL #2   Title Pt will demonstrate ability to write a short paragraph with 95% or better accuracy, and minimal decrease in letter size.    Baseline Pt demonstrates ability to write with grossly 80% legibility, pt is able to demonstrate improvement with cues from therapist   Time 4   Period Weeks   Status On-going   OT SHORT TERM GOAL #3   Title Pt will perform all basic ADLs modified independently   Time 4   Period Weeks   Status Achieved  per pt report 04/04/15   OT SHORT TERM GOAL #4   Title Pt will improve RUE fine motor coordination as evidenced by performing 9 hole peg test in 35 secs or less.   Time 4   Period Weeks   Status On-going  04/18/15:  41.43sec           OT Long Term Goals - 03/23/15 1702    OT LONG TERM GOAL #1   Title Pt will perform light home  management and basic snack prep modified independently demonstrating good safety awareness.   Baseline due 05/22/15   Time 8   Period Weeks   Status New   OT LONG TERM GOAL #2   Title Pt will improve bilateral functional reach score to 10 inches or better, for improved functional reach/ standing balance.   Time 8   Period Weeks   Status New   OT LONG TERM GOAL #3   Title Pt will report increased activity tolerance as evidenced by pt report that she is able to stand for home management/ ADLs for 45 mins-1 hour prior to rest break.   Time 8   Period Weeks   Status New               Plan - Apr 29, 2015 1050    Clinical Impression Statement Pt has made good progress with activity tolerance and handwriting, but continues to need cueing for safety with ambulation/standing activities.  Pt would benefit from continued occupational therapy to incr safety, coordination, activity tolerance for IADLs.   Pt will benefit from skilled therapeutic intervention in order to improve on the following deficits (Retired) Abnormal gait;Decreased coordination;Decreased range of motion;Impaired flexibility;Decreased endurance;Decreased safety awareness;Decreased activity tolerance;Decreased balance;Impaired UE functional use;Decreased cognition;Decreased mobility;Decreased  strength;Impaired vision/preception   Rehab Potential Good   OT Frequency 2x / week   OT Duration 8 weeks   OT Treatment/Interventions Self-care/ADL training;Moist Heat;Fluidtherapy;DME and/or AE instruction;Splinting;Patient/family education;Balance training;Therapeutic exercises;Contrast Bath;Ultrasound;Therapeutic exercise;Therapeutic activities;Cognitive remediation/compensation;Passive range of motion;Functional Mobility Training;Neuromuscular education;Electrical Stimulation;Energy conservation;Manual Therapy;Visual/perceptual remediation/compensation   Plan continue to address strength, standing balance, coordination, safety   OT Home Exercise Plan Issued:  03/29/15 yellow theraband HEP   Consulted and Agree with Plan of Care Family member/caregiver;Patient   Family Member Consulted daughter          G-Codes - 29-Apr-2015 1024    Functional Assessment Tool Used 9 hole peg test RUE41.43 secs,  standing functional reach test RUE 8 inches, with insecurity, LUE 6 inches, performing simple home management tasks (but needs cues for safety due to impulsivity, fall risk, not using RW/cane as recommended)   Functional Limitation Self care   Self Care Goal Status ([id number]) At least 1 percent but less than 20 percent impaired, limited or restricted     Occupational Therapy Progress Note  Dates of Reporting Period: 03/23/15 to 04/29/15  Objective Reports of Subjective Statement: see above  Objective Measurements: see above  Goal Update: see above  Plan: see above  Reason Skilled Services are Required: see above   Problem List Patient Active Problem List   Diagnosis Date Noted  . Gait disturbance, post-stroke 04/22/2015  . Low TSH level 03/10/2015  . Other constipation 03/10/2015  . Constipation 03/08/2015  . Possible Bradycardia 02/22/2015  . Cerebellar hemorrhage, nontraumatic 02/22/2015  . Benign essential HTN 02/21/2015  . 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 Dizziness    . Hypokalemia   . Chronic combined systolic and diastolic congestive heart failure   . Atrial fibrillation with RVR   . HLD (hyperlipidemia)   . Obstructive hydrocephalus   . ICH (intracerebral hemorrhage) 02/05/2015  . COPD bronchitis 12/30/2014  . GERD (gastroesophageal reflux disease) 12/30/2014  . Bladder cancer 01/01/2014  . Glaucoma 01/01/2014  . DCIS (ductal carcinoma in situ) of breast 01/01/2014    [facility] 04/29/15, 11:31 AM  [facility] [address] [place], [place], [address] Phone: [phone]   Fax:  [phone]   Vianne Bulls,  OTR/L 04/25/2015 11:31 AM

## 2015-04-25 NOTE — Therapy (Signed)
[facility] [address] [place], [place], [address] Phone: [phone]   Fax:  [phone]  Physical Therapy Treatment  Patient Details  Name: [name] MRN: [id number] Date of Birth: November 17, 1934 Referring Provider:  Janith Lima, MD  Encounter Date: 04/25/2015      PT End of Session - 04/25/15 1834    Visit Number 10   Number of Visits 16   Date for PT Re-Evaluation 05/25/15   Authorization Type G code every 10th visit   PT Start Time 1100   PT Stop Time 1146   PT Time Calculation (min) 46 min   Equipment Utilized During Treatment Gait belt      Past Medical History  Diagnosis Date  . History of CHF (congestive heart failure)     POST SURG 2010  . COPD (chronic obstructive pulmonary disease)   . History of breast cancer     DX DCIS IN 2004--  S/P RIGHT MASTECTOMY , NO CHEMORADIATION---  NO RECURRENCE  . Glaucoma of both eyes   . History of atrial fibrillation without current medication 2010    POST SURG 2010--  PER PT NO ISSUES SINCE  . Arthritis   . Dyspnea on exertion   . GERD (gastroesophageal reflux disease)   . Nephrolithiasis   . History of shingles     02/ 2015--  back of neck and left flank--  no residual pain  . History of hypertension   . Recurrent bladder papillary carcinoma first dx 06/ 2014    s/p  turbt's  and instillation mitomycin c (chemo)  . ICH (intracerebral hemorrhage) 02/05/2015    Past Surgical History  Procedure Laterality Date  . Total knee arthroplasty Right 03-22-2009  . Partial mastectomy with needle localization Right 01-14-2003    DCIS  . Total mastectomy Right 02-03-2003    W/ SLN BX  AND  POST 03-01-2003 EVACUATION HEMATOMA  . Removal cyst left hand  2013  . Transthoracic echocardiogram  03-25-2009    MILD LVF/ EF 70-80%/ MILD INCREASE SYSTOLIC PULMONARY  PRESSURE  . Transurethral resection of bladder tumor with gyrus (turbt-gyrus) N/A 03/26/2013   Procedure: TRANSURETHRAL RESECTION OF BLADDER TUMOR WITH GYRUS (TURBT-GYRUS);  Surgeon: Alexis Frock, MD;  Location: [facility];  Service: Urology;  Laterality: N/A;  . Cystoscopy w/ ureteral stent placement Bilateral 03/26/2013    Procedure: CYSTOSCOPY WITH BILATERAL RETROGRADE PYELOGRAM/URETERAL STENT PLACEMENT;  Surgeon: Alexis Frock, MD;  Location: [facility];  Service: Urology;  Laterality: Bilateral;  . Transurethral resection of bladder tumor with gyrus (turbt-gyrus) N/A 05/13/2013    Procedure: TRANSURETHRAL RESECTION OF BLADDER TUMOR WITH GYRUS (TURBT-GYRUS)  RE-STAGING TRANSURETHRAL RESECTION OF BLADDER TUMOR, LEFT RETROGRADE PYELOGRAM AND STENT EXCHANGE;  Surgeon: Alexis Frock, MD;  Location: [facility];  Service: Urology;  Laterality: N/A;  . Cystoscopy w/ ureteral stent placement Left 05/13/2013    Procedure: CYSTOSCOPY WITH RETROGRADE PYELOGRAM/URETERAL STENT PLACEMENT STENT EXCHANGE;  Surgeon: Alexis Frock, MD;  Location: [facility];  Service: Urology;  Laterality: Left;  . Tubal ligation    . Cataract extraction w/ intraocular lens  implant, bilateral    . Transurethral resection of bladder tumor with gyrus (turbt-gyrus) N/A 08/11/2014    Procedure: TRANSURETHRAL RESECTION OF BLADDER TUMOR WITH GYRUS (TURBT-GYRUS);  Surgeon: Alexis Frock, MD;  Location: [facility];  Service: Urology;  Laterality: N/A;  . Cystoscopy w/ retrogrades Bilateral 08/11/2014    Procedure: CYSTOSCOPY WITH BILATERAL  RETROGRADE PYELOGRAM AND MITOMYCIN INSTILLATION;  Surgeon: Alexis Frock, MD;  Location: [facility];  Service: Urology;  Laterality: Bilateral;  . Craniectomy N/A 02/05/2015    Procedure: SUBOCCIPITAL CRANIECTOMY EVACUATION OF HEMATOMA;  Surgeon: Consuella Lose, MD;  Location: [place];  Service: Neurosurgery;  Laterality: N/A;    There were no vitals filed for this visit.  Visit Diagnosis:   Abnormality of gait  Decreased functional activity tolerance      Subjective Assessment - 04/25/15 1825    Subjective Pt. states she is doing much better - states she woke up Sat. morning and noticeed that she was doing better with walking and balance; pt is carrying single point cane during ambulation today   Patient is accompained by: Family member  daughter   Pertinent History scoliosis., R hip higher per patient   Patient Stated Goals Return to prior level of function (cane at night due to spinal curve), no device during the day   Currently in Pain? No/denies                         [facility] Adult PT Treatment/Exercise - 04/25/15 1116    Transfers   Transfers Sit to Stand   Transfer Cueing pt performed on blue foam  5 reps without UE support with CGA - able to maintain balance   Ambulation/Gait   Ambulation/Gait Yes   Ambulation/Gait Assistance 4: Min guard   Ambulation/Gait Assistance Details pt states she is wearing sandals today due to feet scissoring more with tennis shoes    Ambulation Distance (Feet) 250 Feet   Assistive device None   Gait Pattern Step-through pattern   Ambulation Surface Level;Indoor   Stairs Yes   Stairs Assistance 4: Min guard   Stair Management Technique Step to pattern  no use of rail to assess balance   Number of Stairs 4   Height of Stairs 6      Neuro Re-ed:  Single limb stance activities on floor with CGA - ladder on floor to incr.  SLS, tapping cones - tipping over and  back upright x 5 cones with min assist; rockerboard with UE support prn inside bars   stepping over and back of bolster on floor with CGA without UE support           PT Short Term Goals - 04/25/15 1838    PT SHORT TERM GOAL #1   Title Patient will return demo HEP for LE strength, balance and endurance tasks.   Baseline 8/17/6: pt reports doing most exercise, needed cues on form with ones performed today   Status Partially Met   PT SHORT TERM GOAL  #2   Title Improve Berg from 34/56 up to 38/56 to demo decreased risk for falls.   Baseline Improved from 34/56 up to 42/56 on 04/18/2015.   Status Achieved   PT SHORT TERM GOAL #3   Title Improve gait speed from 1.99 ft/sec up to 2.4 ft/sec to demo improving functional mobility.   Baseline Met on 04/18/2015 improving from 1.99 ft/sec up to 2.52 ft/sec.   Status Achieved   PT SHORT TERM GOAL #4   Title Tolerate ambulation x 5 minutes nonstop with least restrictive assistive device for improved household and limited community mobility.   Status Achieved   PT SHORT TERM GOAL #5   Baseline Pt requires CGA on 04/18/2015   Status Partially Met  PT Long Term Goals - 03/24/15 1202    PT LONG TERM GOAL #1   Title Verbalize understanding of stroke risk factors and warning signs.   Baseline Target date 05/25/2015   Time 8   Period Weeks   PT LONG TERM GOAL #2   Title Improve Berg from 34/56 upt o 44/56 to demo decreasing risk for falls.   Baseline Target date 05/25/2015   Time 8   Period Weeks   PT LONG TERM GOAL #3   Title Improve gait speed from 1.99 ft/sec up to 2.62 ft/sec to demo transition to limited community ambulator classification of gait.   Baseline Target date 05/25/2015   Time 8   Period Weeks   PT LONG TERM GOAL #4   Title Negotiate 4 steps x 4 reps (to make flight of steps) modified indep with one handrail (baseline=CGA).   Baseline Target date 05/25/2015   Time 8   Period Weeks   PT LONG TERM GOAL #5   Title Improve SIS mobility from 60.1% up to 75% to demo improved functional mobility.   Baseline Target date 05/25/2015   Time 8   Period Weeks               Plan - May 09, 2015 1835    Clinical Impression Statement No scissoring of feet noted during gait training today, however, pt does demonstrate decr. single limb stance; pt carrying cane and not using it during ambualtion   Pt will benefit from skilled therapeutic intervention in order to improve on the  following deficits Abnormal gait;Decreased balance;Decreased activity tolerance;Decreased mobility;Decreased strength;Postural dysfunction;Impaired flexibility;Decreased endurance;Difficulty walking   Rehab Potential Good   Clinical Impairments Affecting Rehab Potential h/o COPD with shortness of breath noted   PT Frequency 2x / week   PT Duration 8 weeks   PT Treatment/Interventions Functional mobility training;Stair training;Gait training;Patient/family education;Therapeutic activities;Therapeutic exercise;Balance training;Neuromuscular re-education;ADLs/Self Care Home Management;Energy conservation   PT Next Visit Plan balance and gait training   Consulted and Agree with Plan of Care Patient;Family member/caregiver   Family Member Consulted daughter          G-Codes - 05-09-15 1840    Functional Assessment Tool Used Merrilee Jansky 42/56; gait velocity 2.52 ft/sec   Functional Limitation Mobility: Walking and moving around   Mobility: Walking and Moving Around Current Status [phone]) At least 20 percent but less than 40 percent impaired, limited or restricted   Mobility: Walking and Moving Around Goal Status [phone]) At least 1 percent but less than 20 percent impaired, limited or restricted      Problem List Patient Active Problem List   Diagnosis Date Noted  . Gait disturbance, post-stroke 04/22/2015  . Low TSH level 03/10/2015  . Other constipation 03/10/2015  . Constipation 03/08/2015  . Possible Bradycardia 02/22/2015  . Cerebellar hemorrhage, nontraumatic 02/22/2015  . Benign essential HTN 02/21/2015  . 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 Dizziness   . Hypokalemia   . Chronic combined systolic and diastolic congestive heart failure   . Atrial fibrillation with RVR   . HLD (hyperlipidemia)   . Obstructive hydrocephalus   . ICH (intracerebral hemorrhage) 02/05/2015  . COPD bronchitis 12/30/2014  . GERD (gastroesophageal reflux disease) 12/30/2014  . Bladder cancer  01/01/2014  . Glaucoma 01/01/2014  . DCIS (ductal carcinoma in situ) of breast 01/01/2014  Physical Therapy Progress Note  Dates of Reporting Period:  03-24-15 to May 09, 2015  Objective Reports of Subjective Statement:  Berg test score 42/56;  Gait  velocity 2.52 ft/sec  Objective Measurements: see above  Goal Update: see above - pt met 3/5 STG's with  2 STG's partially met  Plan: Gait training, therex, Neuro-re-ed and pt education (see plan at initial eval)  Reason Skilled Services are Required:  Decreased balance, gait and lack of coordination    Salem Lembke, [place], PT 04/25/2015, [facility] [address] [place], [place], [address] Phone: [phone]   Fax:  [phone]

## 2015-04-26 ENCOUNTER — Telehealth: Payer: Self-pay | Admitting: Cardiology

## 2015-04-26 MED ORDER — DILTIAZEM HCL ER COATED BEADS 240 MG PO CP24: 240.0000 mg | ORAL_CAPSULE | Freq: Every day | ORAL | Status: DC

## 2015-04-26 NOTE — Telephone Encounter (Signed)
Spoke with pt, refill on cardizem sent to the pharmacy. Follow up scheduled per last office note.

## 2015-04-26 NOTE — Telephone Encounter (Signed)
Please call,concerning her Cardizem.

## 2015-04-27 ENCOUNTER — Ambulatory Visit: Payer: Medicare Other | Admitting: Occupational Therapy

## 2015-04-27 ENCOUNTER — Ambulatory Visit: Payer: Medicare Other | Admitting: Physical Therapy

## 2015-04-27 ENCOUNTER — Encounter: Payer: Self-pay | Admitting: Physical Therapy

## 2015-04-27 DIAGNOSIS — M6281 Muscle weakness (generalized): Secondary | ICD-10-CM

## 2015-04-27 DIAGNOSIS — R6889 Other general symptoms and signs: Secondary | ICD-10-CM

## 2015-04-27 DIAGNOSIS — R279 Unspecified lack of coordination: Secondary | ICD-10-CM

## 2015-04-27 DIAGNOSIS — R269 Unspecified abnormalities of gait and mobility: Secondary | ICD-10-CM | POA: Diagnosis not present

## 2015-04-27 DIAGNOSIS — IMO0002 Reserved for concepts with insufficient information to code with codable children: Secondary | ICD-10-CM

## 2015-04-27 DIAGNOSIS — R278 Other lack of coordination: Secondary | ICD-10-CM

## 2015-04-27 DIAGNOSIS — R531 Weakness: Secondary | ICD-10-CM | POA: Diagnosis not present

## 2015-04-27 DIAGNOSIS — I69898 Other sequelae of other cerebrovascular disease: Secondary | ICD-10-CM | POA: Diagnosis not present

## 2015-04-27 NOTE — Therapy (Signed)
[facility] [address] [place], [place], [address] Phone: [phone]   Fax:  [phone]  Occupational Therapy Treatment  Patient Details  Name: [name] MRN: [id number] Date of Birth: [birth year] Referring Provider:  Janith Lima, MD  Encounter Date: 04/27/2015      OT End of Session - 04/27/15 1346    Visit Number 11   Number of Visits 17   Date for OT Re-Evaluation 05/22/15   Authorization Type Medicare   Authorization Time Period plan to d/c 05/22/15   Authorization - Visit Number 11   Authorization - Number of Visits 20   OT Start Time 6962   OT Stop Time 1400   OT Time Calculation (min) 43 min   Activity Tolerance Patient tolerated treatment well   Behavior During Therapy [facility] for tasks assessed/performed      Past Medical History  Diagnosis Date  . History of CHF (congestive heart failure)     POST SURG 2010  . COPD (chronic obstructive pulmonary disease)   . History of breast cancer     DX DCIS IN 2004--  S/P RIGHT MASTECTOMY , NO CHEMORADIATION---  NO RECURRENCE  . Glaucoma of both eyes   . History of atrial fibrillation without current medication 2010    POST SURG 2010--  PER PT NO ISSUES SINCE  . Arthritis   . Dyspnea on exertion   . GERD (gastroesophageal reflux disease)   . Nephrolithiasis   . History of shingles     02/ 2015--  back of neck and left flank--  no residual pain  . History of hypertension   . Recurrent bladder papillary carcinoma first dx 06/ 2014    s/p  turbt's  and instillation mitomycin c (chemo)  . ICH (intracerebral hemorrhage) 02/05/2015    Past Surgical History  Procedure Laterality Date  . Total knee arthroplasty Right 03-22-2009  . Partial mastectomy with needle localization Right 01-14-2003    DCIS  . Total mastectomy Right 02-03-2003    W/ SLN BX  AND  POST 03-01-2003 EVACUATION HEMATOMA  . Removal cyst left hand  2013  . Transthoracic  echocardiogram  03-25-2009    MILD LVF/ EF 70-80%/ MILD INCREASE SYSTOLIC PULMONARY  PRESSURE  . Transurethral resection of bladder tumor with gyrus (turbt-gyrus) N/A 03/26/2013    Procedure: TRANSURETHRAL RESECTION OF BLADDER TUMOR WITH GYRUS (TURBT-GYRUS);  Surgeon: Alexis Frock, MD;  Location: [facility];  Service: Urology;  Laterality: N/A;  . Cystoscopy w/ ureteral stent placement Bilateral 03/26/2013    Procedure: CYSTOSCOPY WITH BILATERAL RETROGRADE PYELOGRAM/URETERAL STENT PLACEMENT;  Surgeon: Alexis Frock, MD;  Location: [facility];  Service: Urology;  Laterality: Bilateral;  . Transurethral resection of bladder tumor with gyrus (turbt-gyrus) N/A 05/13/2013    Procedure: TRANSURETHRAL RESECTION OF BLADDER TUMOR WITH GYRUS (TURBT-GYRUS)  RE-STAGING TRANSURETHRAL RESECTION OF BLADDER TUMOR, LEFT RETROGRADE PYELOGRAM AND STENT EXCHANGE;  Surgeon: Alexis Frock, MD;  Location: [facility];  Service: Urology;  Laterality: N/A;  . Cystoscopy w/ ureteral stent placement Left 05/13/2013    Procedure: CYSTOSCOPY WITH RETROGRADE PYELOGRAM/URETERAL STENT PLACEMENT STENT EXCHANGE;  Surgeon: Alexis Frock, MD;  Location: [facility];  Service: Urology;  Laterality: Left;  . Tubal ligation    . Cataract extraction w/ intraocular lens  implant, bilateral    . Transurethral resection of bladder tumor with gyrus (turbt-gyrus) N/A 08/11/2014    Procedure: TRANSURETHRAL RESECTION OF BLADDER TUMOR WITH GYRUS (TURBT-GYRUS);  Surgeon: Alexis Frock, MD;  Location: [facility];  Service: Urology;  Laterality: N/A;  . Cystoscopy w/ retrogrades Bilateral 08/11/2014    Procedure: CYSTOSCOPY WITH BILATERAL RETROGRADE PYELOGRAM AND MITOMYCIN INSTILLATION;  Surgeon: Alexis Frock, MD;  Location: [facility];  Service: Urology;  Laterality: Bilateral;  . Craniectomy N/A 02/05/2015    Procedure: SUBOCCIPITAL CRANIECTOMY EVACUATION  OF HEMATOMA;  Surgeon: Consuella Lose, MD;  Location: [facility];  Service: Neurosurgery;  Laterality: N/A;    There were no vitals filed for this visit.  Visit Diagnosis:  Decreased functional activity tolerance  Decreased coordination  Generalized muscle weakness  Weakness due to cerebrovascular accident      Subjective Assessment - 04/28/15 1357    Patient is accompained by: Family member   Pertinent History see EPIC snap shot   Patient Stated Goals to get back to normal   Currently in Pain? No/denies         Treatment: Arm bike x mins level 3 for conditioning. O2 sats 95, HE 105 BPM folllowing activity, pt was able to maintain 36-40 RPM. Self care: Handwriting activities with cognitive component to organize activities for the day, and write legibility, mod v.c. For larger letter size and to slow down. Pt demonstrated improved performance/ legibility of handwriting at end of task.                       OT Short Term Goals - 04/25/15 1058    OT SHORT TERM GOAL #1   Title I with HEP.   Baseline v.c. to slow down   Time 4   Period Weeks   Status Partially Met   OT SHORT TERM GOAL #2   Title Pt will demonstrate ability to write a short paragraph with 95% or better accuracy, and minimal decrease in letter size.   Baseline Pt demonstrates ability to write with grossly 80% legibility, pt is able to demonstrate improvement with cues from therapist   Time 4   Period Weeks   Status On-going   OT SHORT TERM GOAL #3   Title Pt will perform all basic ADLs modified independently   Time 4   Period Weeks   Status Achieved  per pt report 04/04/15   OT SHORT TERM GOAL #4   Title Pt will improve RUE fine motor coordination as evidenced by performing 9 hole peg test in 35 secs or less.   Time 4   Period Weeks   Status On-going  04/18/15:  41.43sec           OT Long Term Goals - 04/27/15 1345    OT LONG TERM GOAL #1   Title Pt will perform light home  management and basic snack prep modified independently demonstrating good safety awareness.   Status On-going   OT LONG TERM GOAL #2   Title Pt will improve bilateral functional reach score to 10 inches or better, for improved functional reach/ standing balance.   Status On-going   OT LONG TERM GOAL #3   Title Pt will report increased activity tolerance as evidenced by pt report that she is able to stand for home management/ ADLs for 45 mins-1 hour prior to rest break.   Status On-going               Plan - 04/28/15 1352    Clinical Impression Statement Pt is progressing towards goals for handwriting. Pt requires continued v.c. for safety with standing and ambulation.  Pt will benefit from skilled therapeutic intervention in order to improve on the following deficits (Retired) Abnormal gait;Decreased coordination;Decreased range of motion;Impaired flexibility;Decreased endurance;Decreased safety awareness;Decreased activity tolerance;Decreased balance;Impaired UE functional use;Decreased cognition;Decreased mobility;Decreased strength;Impaired vision/preception   Rehab Potential Good   OT Frequency 2x / week   OT Duration 8 weeks   OT Treatment/Interventions Self-care/ADL training;Moist Heat;Fluidtherapy;DME and/or AE instruction;Splinting;Patient/family education;Balance training;Therapeutic exercises;Contrast Bath;Ultrasound;Therapeutic exercise;Therapeutic activities;Cognitive remediation/compensation;Passive range of motion;Functional Mobility Training;Neuromuscular education;Electrical Stimulation;Energy conservation;Manual Therapy;Visual/perceptual remediation/compensation   Plan strength, standing balance, coordination, safety   OT Home Exercise Plan Issued:  03/29/15 yellow theraband HEP   Consulted and Agree with Plan of Care Patient        Problem List Patient Active Problem List   Diagnosis Date Noted  . Gait disturbance, post-stroke 04/22/2015  . Low TSH level  03/10/2015  . Other constipation 03/10/2015  . Constipation 03/08/2015  . Possible Bradycardia 02/22/2015  . Cerebellar hemorrhage, nontraumatic 02/22/2015  . Benign essential HTN 02/21/2015  . 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 Dizziness   . Hypokalemia   . Chronic combined systolic and diastolic congestive heart failure   . Atrial fibrillation with RVR   . HLD (hyperlipidemia)   . Obstructive hydrocephalus   . ICH (intracerebral hemorrhage) 02/05/2015  . COPD bronchitis 12/30/2014  . GERD (gastroesophageal reflux disease) 12/30/2014  . Bladder cancer 01/01/2014  . Glaucoma 01/01/2014  . DCIS (ductal carcinoma in situ) of breast 01/01/2014    RINE,KATHRYN 04/28/2015, 1:58 PM Theone Murdoch, OTR/L [phone] Phone: [phone] 1:58 PM 04/28/2015 [facility] [address] [place], [place], [address] Phone: [phone]   Fax:  [phone]

## 2015-04-27 NOTE — Therapy (Signed)
[facility] [address] [place], [place], [address] Phone: [phone]   Fax:  [phone]  Physical Therapy Treatment  Patient Details  Name: [name] MRN: [id number] Date of Birth: 1935/01/27 Referring Provider:  Janith Lima, MD  Encounter Date: 04/27/2015      PT End of Session - 04/27/15 1403    Visit Number 11   Number of Visits 16   Date for PT Re-Evaluation 05/25/15   Authorization Type G code every 10th visit   PT Start Time 1400   PT Stop Time 1440   PT Time Calculation (min) 40 min   Equipment Utilized During Treatment Gait belt      Past Medical History  Diagnosis Date  . History of CHF (congestive heart failure)     POST SURG 2010  . COPD (chronic obstructive pulmonary disease)   . History of breast cancer     DX DCIS IN 2004--  S/P RIGHT MASTECTOMY , NO CHEMORADIATION---  NO RECURRENCE  . Glaucoma of both eyes   . History of atrial fibrillation without current medication 2010    POST SURG 2010--  PER PT NO ISSUES SINCE  . Arthritis   . Dyspnea on exertion   . GERD (gastroesophageal reflux disease)   . Nephrolithiasis   . History of shingles     02/ 2015--  back of neck and left flank--  no residual pain  . History of hypertension   . Recurrent bladder papillary carcinoma first dx 06/ 2014    s/p  turbt's  and instillation mitomycin c (chemo)  . ICH (intracerebral hemorrhage) 02/05/2015    Past Surgical History  Procedure Laterality Date  . Total knee arthroplasty Right 03-22-2009  . Partial mastectomy with needle localization Right 01-14-2003    DCIS  . Total mastectomy Right 02-03-2003    W/ SLN BX  AND  POST 03-01-2003 EVACUATION HEMATOMA  . Removal cyst left hand  2013  . Transthoracic echocardiogram  03-25-2009    MILD LVF/ EF 70-80%/ MILD INCREASE SYSTOLIC PULMONARY  PRESSURE  . Transurethral resection of bladder tumor with gyrus (turbt-gyrus) N/A 03/26/2013   Procedure: TRANSURETHRAL RESECTION OF BLADDER TUMOR WITH GYRUS (TURBT-GYRUS);  Surgeon: Alexis Frock, MD;  Location: [facility];  Service: Urology;  Laterality: N/A;  . Cystoscopy w/ ureteral stent placement Bilateral 03/26/2013    Procedure: CYSTOSCOPY WITH BILATERAL RETROGRADE PYELOGRAM/URETERAL STENT PLACEMENT;  Surgeon: Alexis Frock, MD;  Location: [facility];  Service: Urology;  Laterality: Bilateral;  . Transurethral resection of bladder tumor with gyrus (turbt-gyrus) N/A 05/13/2013    Procedure: TRANSURETHRAL RESECTION OF BLADDER TUMOR WITH GYRUS (TURBT-GYRUS)  RE-STAGING TRANSURETHRAL RESECTION OF BLADDER TUMOR, LEFT RETROGRADE PYELOGRAM AND STENT EXCHANGE;  Surgeon: Alexis Frock, MD;  Location: [facility];  Service: Urology;  Laterality: N/A;  . Cystoscopy w/ ureteral stent placement Left 05/13/2013    Procedure: CYSTOSCOPY WITH RETROGRADE PYELOGRAM/URETERAL STENT PLACEMENT STENT EXCHANGE;  Surgeon: Alexis Frock, MD;  Location: [facility];  Service: Urology;  Laterality: Left;  . Tubal ligation    . Cataract extraction w/ intraocular lens  implant, bilateral    . Transurethral resection of bladder tumor with gyrus (turbt-gyrus) N/A 08/11/2014    Procedure: TRANSURETHRAL RESECTION OF BLADDER TUMOR WITH GYRUS (TURBT-GYRUS);  Surgeon: Alexis Frock, MD;  Location: [facility];  Service: Urology;  Laterality: N/A;  . Cystoscopy w/ retrogrades Bilateral 08/11/2014    Procedure: CYSTOSCOPY WITH BILATERAL  RETROGRADE PYELOGRAM AND MITOMYCIN INSTILLATION;  Surgeon: Alexis Frock, MD;  Location: [facility];  Service: Urology;  Laterality: Bilateral;  . Craniectomy N/A 02/05/2015    Procedure: SUBOCCIPITAL CRANIECTOMY EVACUATION OF HEMATOMA;  Surgeon: Consuella Lose, MD;  Location: [facility];  Service: Neurosurgery;  Laterality: N/A;    There were no vitals filed for this visit.  .  Abnormality  of gait    Decreased functional activity tolerance     Decreased coordination     Generalized muscle weakness     Weakness due to cerebrovascular accident   :        Subjective Assessment - 04/27/15 1403    Subjective No new complaints. No falls or pain to report.   Currently in Pain? No/denies   Pain Score 0-No pain          [facility] Adult PT Treatment/Exercise - 04/27/15 1404    Ambulation/Gait   Ambulation/Gait Yes   Ambulation/Gait Assistance 4: Min guard;5: Supervision   Ambulation/Gait Assistance Details no scissoring noted indoors,  3 episodes of instability with gait (mostly with turns) outdoors, pt self correcting 2 and min assist needed with one to correct balance.                              Ambulation Distance (Feet) 450 Feet  x1 indoors, 500 feet indoor/outdoors combined   Assistive device None   Gait Pattern Step-through pattern   Ambulation Surface Level;Indoor;Outdoor;Paved;Unlevel   Gait velocity 15.25 sec's=      Neuro Re-ed: Single leg stance Cones on floor: - alternating fwd toe taps, cross toe taps, fwd double toe taps  Cones on blue mat - alternating fwd toe taps,cross toe taps, fwd double toe taps, cross double toe taps and flip over/up x 10 reps each bil legs with min guard assist to min assist for balance. Cues on posture and weight shifting.         PT Short Term Goals - 04/25/15 1838    PT SHORT TERM GOAL #1   Title Patient will return demo HEP for LE strength, balance and endurance tasks.   Baseline 8/17/6: pt reports doing most exercise, needed cues on form with ones performed today   Status Partially Met   PT SHORT TERM GOAL #2   Title Improve Berg from 34/56 up to 38/56 to demo decreased risk for falls.   Baseline Improved from 34/56 up to 42/56 on 04/18/2015.   Status Achieved   PT SHORT TERM GOAL #3   Title Improve gait speed from 1.99 ft/sec up to 2.4 ft/sec to demo improving functional mobility.   Baseline Met on 04/18/2015  improving from 1.99 ft/sec up to 2.52 ft/sec.   Status Achieved   PT SHORT TERM GOAL #4   Title Tolerate ambulation x 5 minutes nonstop with least restrictive assistive device for improved household and limited community mobility.   Status Achieved   PT SHORT TERM GOAL #5   Baseline Pt requires CGA on 04/18/2015   Status Partially Met           PT Long Term Goals - 03/24/15 1202    PT LONG TERM GOAL #1   Title Verbalize understanding of stroke risk factors and warning signs.   Baseline Target date 05/25/2015   Time 8   Period Weeks   PT LONG TERM GOAL #2   Title Improve Berg from 34/56 upt o 44/56 to demo  decreasing risk for falls.   Baseline Target date 05/25/2015   Time 8   Period Weeks   PT LONG TERM GOAL #3   Title Improve gait speed from 1.99 ft/sec up to 2.62 ft/sec to demo transition to limited community ambulator classification of gait.   Baseline Target date 05/25/2015   Time 8   Period Weeks   PT LONG TERM GOAL #4   Title Negotiate 4 steps x 4 reps (to make flight of steps) modified indep with one handrail (baseline=CGA).   Baseline Target date 05/25/2015   Time 8   Period Weeks   PT LONG TERM GOAL #5   Title Improve SIS mobility from 60.1% up to 75% to demo improved functional mobility.   Baseline Target date 05/25/2015   Time 8   Period Weeks           Plan - 04/27/15 1403    Clinical Impression Statement Pt with improved gait pattern vs last week, no scissoring noted with gait. Did have some instability at times on outdoor surfaces. Pt continues to be challenged by balance activities in single leg stance and/or on compliant surfaces. Pt making steady progress toward goals.    Pt will benefit from skilled therapeutic intervention in order to improve on the following deficits Abnormal gait;Decreased balance;Decreased activity tolerance;Decreased mobility;Decreased strength;Postural dysfunction;Impaired flexibility;Decreased endurance;Difficulty walking   Rehab  Potential Good   Clinical Impairments Affecting Rehab Potential h/o COPD with shortness of breath noted   PT Frequency 2x / week   PT Duration 8 weeks   PT Treatment/Interventions Functional mobility training;Stair training;Gait training;Patient/family education;Therapeutic activities;Therapeutic exercise;Balance training;Neuromuscular re-education;ADLs/Self Care Home Management;Energy conservation   PT Next Visit Plan balance and gait training   Consulted and Agree with Plan of Care Patient;Family member/caregiver   Family Member Consulted son        Problem List Patient Active Problem List   Diagnosis Date Noted  . Gait disturbance, post-stroke 04/22/2015  . Low TSH level 03/10/2015  . Other constipation 03/10/2015  . Constipation 03/08/2015  . Possible Bradycardia 02/22/2015  . Cerebellar hemorrhage, nontraumatic 02/22/2015  . Benign essential HTN 02/21/2015  . 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 Dizziness   . Hypokalemia   . Chronic combined systolic and diastolic congestive heart failure   . Atrial fibrillation with RVR   . HLD (hyperlipidemia)   . Obstructive hydrocephalus   . ICH (intracerebral hemorrhage) 02/05/2015  . COPD bronchitis 12/30/2014  . GERD (gastroesophageal reflux disease) 12/30/2014  . Bladder cancer 01/01/2014  . Glaucoma 01/01/2014  . DCIS (ductal carcinoma in situ) of breast 01/01/2014    Willow Ora 04/27/2015, 2:40 PM  Willow Ora, PTA, [facility] [address] [place], [place] [address] [phone] 04/27/2015, 2:40 PM

## 2015-04-29 ENCOUNTER — Ambulatory Visit: Payer: Medicare Other | Admitting: Physical Therapy

## 2015-05-03 ENCOUNTER — Ambulatory Visit: Payer: Medicare Other | Admitting: Rehabilitative and Restorative Service Providers"

## 2015-05-03 DIAGNOSIS — R6889 Other general symptoms and signs: Secondary | ICD-10-CM | POA: Diagnosis not present

## 2015-05-03 DIAGNOSIS — R531 Weakness: Secondary | ICD-10-CM | POA: Diagnosis not present

## 2015-05-03 DIAGNOSIS — R269 Unspecified abnormalities of gait and mobility: Secondary | ICD-10-CM

## 2015-05-03 DIAGNOSIS — I69898 Other sequelae of other cerebrovascular disease: Secondary | ICD-10-CM | POA: Diagnosis not present

## 2015-05-03 DIAGNOSIS — M6281 Muscle weakness (generalized): Secondary | ICD-10-CM

## 2015-05-03 DIAGNOSIS — R279 Unspecified lack of coordination: Secondary | ICD-10-CM | POA: Diagnosis not present

## 2015-05-03 NOTE — Therapy (Signed)
[facility] [address] [place], [place], [address] Phone: [phone]   Fax:  [phone]  Physical Therapy Treatment  Patient Details  Name: [name] MRN: [id number] Date of Birth: 1935-04-27 Referring Provider:  Janith Lima, MD  Encounter Date: 05/03/2015      PT End of Session - 05/03/15 1029    Visit Number 12   Number of Visits 16   Date for PT Re-Evaluation 05/25/15   Authorization Type G code every 10th visit   PT Start Time 0934   PT Stop Time 1020   PT Time Calculation (min) 46 min   Equipment Utilized During Treatment Gait belt   Activity Tolerance Patient tolerated treatment well   Behavior During Therapy [facility] for tasks assessed/performed      Past Medical History  Diagnosis Date  . History of CHF (congestive heart failure)     POST SURG 2010  . COPD (chronic obstructive pulmonary disease)   . History of breast cancer     DX DCIS IN 2004--  S/P RIGHT MASTECTOMY , NO CHEMORADIATION---  NO RECURRENCE  . Glaucoma of both eyes   . History of atrial fibrillation without current medication 2010    POST SURG 2010--  PER PT NO ISSUES SINCE  . Arthritis   . Dyspnea on exertion   . GERD (gastroesophageal reflux disease)   . Nephrolithiasis   . History of shingles     02/ 2015--  back of neck and left flank--  no residual pain  . History of hypertension   . Recurrent bladder papillary carcinoma first dx 06/ 2014    s/p  turbt's  and instillation mitomycin c (chemo)  . ICH (intracerebral hemorrhage) 02/05/2015    Past Surgical History  Procedure Laterality Date  . Total knee arthroplasty Right 03-22-2009  . Partial mastectomy with needle localization Right 01-14-2003    DCIS  . Total mastectomy Right 02-03-2003    W/ SLN BX  AND  POST 03-01-2003 EVACUATION HEMATOMA  . Removal cyst left hand  2013  . Transthoracic echocardiogram  03-25-2009    MILD LVF/ EF 70-80%/ MILD INCREASE  SYSTOLIC PULMONARY  PRESSURE  . Transurethral resection of bladder tumor with gyrus (turbt-gyrus) N/A 03/26/2013    Procedure: TRANSURETHRAL RESECTION OF BLADDER TUMOR WITH GYRUS (TURBT-GYRUS);  Surgeon: Alexis Frock, MD;  Location: [facility];  Service: Urology;  Laterality: N/A;  . Cystoscopy w/ ureteral stent placement Bilateral 03/26/2013    Procedure: CYSTOSCOPY WITH BILATERAL RETROGRADE PYELOGRAM/URETERAL STENT PLACEMENT;  Surgeon: Alexis Frock, MD;  Location: [facility];  Service: Urology;  Laterality: Bilateral;  . Transurethral resection of bladder tumor with gyrus (turbt-gyrus) N/A 05/13/2013    Procedure: TRANSURETHRAL RESECTION OF BLADDER TUMOR WITH GYRUS (TURBT-GYRUS)  RE-STAGING TRANSURETHRAL RESECTION OF BLADDER TUMOR, LEFT RETROGRADE PYELOGRAM AND STENT EXCHANGE;  Surgeon: Alexis Frock, MD;  Location: [facility];  Service: Urology;  Laterality: N/A;  . Cystoscopy w/ ureteral stent placement Left 05/13/2013    Procedure: CYSTOSCOPY WITH RETROGRADE PYELOGRAM/URETERAL STENT PLACEMENT STENT EXCHANGE;  Surgeon: Alexis Frock, MD;  Location: [facility];  Service: Urology;  Laterality: Left;  . Tubal ligation    . Cataract extraction w/ intraocular lens  implant, bilateral    . Transurethral resection of bladder tumor with gyrus (turbt-gyrus) N/A 08/11/2014    Procedure: TRANSURETHRAL RESECTION OF BLADDER TUMOR WITH GYRUS (TURBT-GYRUS);  Surgeon: Alexis Frock, MD;  Location: [facility];  Service: Urology;  Laterality: N/A;  . Cystoscopy w/ retrogrades Bilateral 08/11/2014    Procedure: CYSTOSCOPY WITH BILATERAL RETROGRADE PYELOGRAM AND MITOMYCIN INSTILLATION;  Surgeon: Alexis Frock, MD;  Location: [facility];  Service: Urology;  Laterality: Bilateral;  . Craniectomy N/A 02/05/2015    Procedure: SUBOCCIPITAL CRANIECTOMY EVACUATION OF HEMATOMA;  Surgeon: Consuella Lose, MD;  Location: [facility];  Service: Neurosurgery;  Laterality: N/A;    There were no vitals filed for this visit.  Visit Diagnosis:  Abnormality of gait  Generalized muscle weakness      Subjective Assessment - 05/03/15 0938    Subjective The patient is not using her SPC at home.  She is carrying her cane when out of the house and is not using it regularly per report.  PT and patient discussed fall safety and recommendation for device.  Patient plans to continue as she is currently functioning (without device).  She feels she has returned to 80% of prior level of functioning.  She is limited by balance in the yard and walking per her report.   Patient Stated Goals Return to prior level of function (cane at night due to spinal curve), no device during the day   Currently in Pain? No/denies      Gait: Community ambulation on grass, rubber mulch, curbs with SPC and SBA to CGA.  Patient needs cues to use her SPC instead of carry it during functional mobiity. Dynamic gait activities walking on level surfaces with CGA  x 150 feet performing ball toss and reaching activities with cues to keep feet wider for improved stance/stability. Gait without device on level surfaces x 250 feet x 2 reps with cues on wider base of support and posture + breathing  NEUROMUSCULAR RE-EDUCATION: Standing sidestepping x 30 feet R and L sides with min A Standing wide leg marching x 10 reps with focus on lateral weight shifting Alternating foot taps with longer steps Ant/post weight shift with feet positioned in stride with min A for support  THERAPEUTIC EXERCISE: Step ups with R LE bringing L LE onto 18" height box for hip abduction Standing heel raises x 10 reps bilaterally off edge of step.         PT Short Term Goals - 04/25/15 1838    PT SHORT TERM GOAL #1   Title Patient will return demo HEP for LE strength, balance and endurance tasks.   Baseline 8/17/6: pt reports doing most exercise, needed cues on form with ones  performed today   Status Partially Met   PT SHORT TERM GOAL #2   Title Improve Berg from 34/56 up to 38/56 to demo decreased risk for falls.   Baseline Improved from 34/56 up to 42/56 on 04/18/2015.   Status Achieved   PT SHORT TERM GOAL #3   Title Improve gait speed from 1.99 ft/sec up to 2.4 ft/sec to demo improving functional mobility.   Baseline Met on 04/18/2015 improving from 1.99 ft/sec up to 2.52 ft/sec.   Status Achieved   PT SHORT TERM GOAL #4   Title Tolerate ambulation x 5 minutes nonstop with least restrictive assistive device for improved household and limited community mobility.   Status Achieved   PT SHORT TERM GOAL #5   Baseline Pt requires CGA on 04/18/2015   Status Partially Met           PT Long Term Goals - 03/24/15 1202    PT LONG TERM GOAL #1   Title Verbalize understanding  of stroke risk factors and warning signs.   Baseline Target date 05/25/2015   Time 8   Period Weeks   PT LONG TERM GOAL #2   Title Improve Berg from 34/56 upt o 44/56 to demo decreasing risk for falls.   Baseline Target date 05/25/2015   Time 8   Period Weeks   PT LONG TERM GOAL #3   Title Improve gait speed from 1.99 ft/sec up to 2.62 ft/sec to demo transition to limited community ambulator classification of gait.   Baseline Target date 05/25/2015   Time 8   Period Weeks   PT LONG TERM GOAL #4   Title Negotiate 4 steps x 4 reps (to make flight of steps) modified indep with one handrail (baseline=CGA).   Baseline Target date 05/25/2015   Time 8   Period Weeks   PT LONG TERM GOAL #5   Title Improve SIS mobility from 60.1% up to 75% to demo improved functional mobility.   Baseline Target date 05/25/2015   Time 8   Period Weeks               Plan - 05/03/15 1030    Clinical Impression Statement The patient expresses interest in ending PT before LTG date of 05/25/2015 reporting she has returned to most home activities with the exception of driving.  She is also not willing to  use SPC in the home b/c she feels she does not need it for balance/stability.  PT has educated patient on fall risk and she verbalizes understanding.  PT to begin checking LTGs and work towards early d/c per patient's request.   PT Next Visit Plan Begin checking LTGs and come up with d/c plan for post d/c exercise, and review CVA risk factors/warning signs   Consulted and Agree with Plan of Care Patient;Family member/caregiver   Family Member Consulted daughter        Problem List Patient Active Problem List   Diagnosis Date Noted  . Gait disturbance, post-stroke 04/22/2015  . Low TSH level 03/10/2015  . Other constipation 03/10/2015  . Constipation 03/08/2015  . Possible Bradycardia 02/22/2015  . Cerebellar hemorrhage, nontraumatic 02/22/2015  . Benign essential HTN 02/21/2015  . 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 Dizziness   . Hypokalemia   . Chronic combined systolic and diastolic congestive heart failure   . Atrial fibrillation with RVR   . HLD (hyperlipidemia)   . Obstructive hydrocephalus   . ICH (intracerebral hemorrhage) 02/05/2015  . COPD bronchitis 12/30/2014  . GERD (gastroesophageal reflux disease) 12/30/2014  . Bladder cancer 01/01/2014  . Glaucoma 01/01/2014  . DCIS (ductal carcinoma in situ) of breast 01/01/2014    Emelio Schneller, PT 05/03/2015, 10:32 AM  [facility] [address] [place], [place], [address] Phone: [phone]   Fax:  [phone]

## 2015-05-05 ENCOUNTER — Ambulatory Visit: Payer: Medicare Other | Attending: Internal Medicine | Admitting: Rehabilitative and Restorative Service Providers"

## 2015-05-05 DIAGNOSIS — R6889 Other general symptoms and signs: Secondary | ICD-10-CM | POA: Diagnosis not present

## 2015-05-05 DIAGNOSIS — R269 Unspecified abnormalities of gait and mobility: Secondary | ICD-10-CM | POA: Insufficient documentation

## 2015-05-05 DIAGNOSIS — R531 Weakness: Secondary | ICD-10-CM | POA: Diagnosis not present

## 2015-05-05 DIAGNOSIS — M6281 Muscle weakness (generalized): Secondary | ICD-10-CM | POA: Insufficient documentation

## 2015-05-05 DIAGNOSIS — I69898 Other sequelae of other cerebrovascular disease: Secondary | ICD-10-CM | POA: Diagnosis not present

## 2015-05-05 DIAGNOSIS — R279 Unspecified lack of coordination: Secondary | ICD-10-CM | POA: Diagnosis not present

## 2015-05-05 NOTE — Therapy (Signed)
[facility] [address] [place], [place], [address] Phone: [phone]   Fax:  [phone]  Physical Therapy Treatment  Patient Details  Name: [name] MRN: [id number] Date of Birth: 1935/07/07 Referring Provider:  Janith Lima, MD  Encounter Date: 05/05/2015      PT End of Session - 05/05/15 1306    Visit Number 13   Number of Visits 16   Date for PT Re-Evaluation 05/25/15   Authorization Type G code every 10th visit   PT Start Time 1105   PT Stop Time 1145   PT Time Calculation (min) 40 min   Equipment Utilized During Treatment Gait belt   Activity Tolerance Patient tolerated treatment well   Behavior During Therapy [facility] for tasks assessed/performed      Past Medical History  Diagnosis Date  . History of CHF (congestive heart failure)     POST SURG 2010  . COPD (chronic obstructive pulmonary disease)   . History of breast cancer     DX DCIS IN 2004--  S/P RIGHT MASTECTOMY , NO CHEMORADIATION---  NO RECURRENCE  . Glaucoma of both eyes   . History of atrial fibrillation without current medication 2010    POST SURG 2010--  PER PT NO ISSUES SINCE  . Arthritis   . Dyspnea on exertion   . GERD (gastroesophageal reflux disease)   . Nephrolithiasis   . History of shingles     02/ 2015--  back of neck and left flank--  no residual pain  . History of hypertension   . Recurrent bladder papillary carcinoma first dx 06/ 2014    s/p  turbt's  and instillation mitomycin c (chemo)  . ICH (intracerebral hemorrhage) 02/05/2015    Past Surgical History  Procedure Laterality Date  . Total knee arthroplasty Right 03-22-2009  . Partial mastectomy with needle localization Right 01-14-2003    DCIS  . Total mastectomy Right 02-03-2003    W/ SLN BX  AND  POST 03-01-2003 EVACUATION HEMATOMA  . Removal cyst left hand  2013  . Transthoracic echocardiogram  03-25-2009    MILD LVF/ EF 70-80%/ MILD INCREASE SYSTOLIC  PULMONARY  PRESSURE  . Transurethral resection of bladder tumor with gyrus (turbt-gyrus) N/A 03/26/2013    Procedure: TRANSURETHRAL RESECTION OF BLADDER TUMOR WITH GYRUS (TURBT-GYRUS);  Surgeon: Alexis Frock, MD;  Location: [facility];  Service: Urology;  Laterality: N/A;  . Cystoscopy w/ ureteral stent placement Bilateral 03/26/2013    Procedure: CYSTOSCOPY WITH BILATERAL RETROGRADE PYELOGRAM/URETERAL STENT PLACEMENT;  Surgeon: Alexis Frock, MD;  Location: [facility];  Service: Urology;  Laterality: Bilateral;  . Transurethral resection of bladder tumor with gyrus (turbt-gyrus) N/A 05/13/2013    Procedure: TRANSURETHRAL RESECTION OF BLADDER TUMOR WITH GYRUS (TURBT-GYRUS)  RE-STAGING TRANSURETHRAL RESECTION OF BLADDER TUMOR, LEFT RETROGRADE PYELOGRAM AND STENT EXCHANGE;  Surgeon: Alexis Frock, MD;  Location: [facility];  Service: Urology;  Laterality: N/A;  . Cystoscopy w/ ureteral stent placement Left 05/13/2013    Procedure: CYSTOSCOPY WITH RETROGRADE PYELOGRAM/URETERAL STENT PLACEMENT STENT EXCHANGE;  Surgeon: Alexis Frock, MD;  Location: [facility];  Service: Urology;  Laterality: Left;  . Tubal ligation    . Cataract extraction w/ intraocular lens  implant, bilateral    . Transurethral resection of bladder tumor with gyrus (turbt-gyrus) N/A 08/11/2014    Procedure: TRANSURETHRAL RESECTION OF BLADDER TUMOR WITH GYRUS (TURBT-GYRUS);  Surgeon: Alexis Frock, MD;  Location: [facility];  Service: Urology;  Laterality: N/A;  . Cystoscopy w/ retrogrades Bilateral 08/11/2014    Procedure: CYSTOSCOPY WITH BILATERAL RETROGRADE PYELOGRAM AND MITOMYCIN INSTILLATION;  Surgeon: Alexis Frock, MD;  Location: [facility];  Service: Urology;  Laterality: Bilateral;  . Craniectomy N/A 02/05/2015    Procedure: SUBOCCIPITAL CRANIECTOMY EVACUATION OF HEMATOMA;  Surgeon: Consuella Lose, MD;  Location: [facility];   Service: Neurosurgery;  Laterality: N/A;    There were no vitals filed for this visit.  Visit Diagnosis:  Abnormality of gait  Generalized muscle weakness      Subjective Assessment - 05/05/15 1305    Subjective The patient feels she is able to ambulate well in the home without a device.  She is interested in ending PT early as she feels she is moving well.   Patient Stated Goals Return to prior level of function (cane at night due to spinal curve), no device during the day   Currently in Pain? No/denies            [facility] PT Assessment - 05/05/15 1119    Ambulation/Gait   Ambulation/Gait Yes   Ambulation/Gait Assistance 5: Supervision   Ambulation Distance (Feet) 200 Feet  x 3 reps   Assistive device None   Gait Pattern Step-through pattern;Decreased stance time - right;Decreased weight shift to right   Ambulation Surface Level;Indoor   Gait velocity 2.31 ft/sec   Stairs Yes   Stairs Assistance 6: Modified independent (Device/Increase time)   Stair Management Technique Step to pattern  descending, reciprocal ascending   Number of Stairs 4   Height of Stairs --  repeated 4 times   Standardized Balance Assessment   Standardized Balance Assessment Berg Balance Test   Berg Balance Test   Sit to Stand Able to stand without using hands and stabilize independently   Standing Unsupported Able to stand safely 2 minutes   Sitting with Back Unsupported but Feet Supported on Floor or Stool Able to sit safely and securely 2 minutes   Stand to Sit Sits safely with minimal use of hands   Transfers Able to transfer safely, minor use of hands   Standing Unsupported with Eyes Closed Able to stand 10 seconds safely   Standing Ubsupported with Feet Together Able to place feet together independently and stand 1 minute safely   From Standing, Reach Forward with Outstretched Arm Can reach confidently >25 cm (10")   From Standing Position, Pick up Object from Floor Able to pick up shoe safely  and easily   From Standing Position, Turn to Look Behind Over each Shoulder Looks behind one side only/other side shows less weight shift   Turn 360 Degrees Able to turn 360 degrees safely but slowly   Standing Unsupported, Alternately Place Feet on Step/Stool Able to stand independently and safely and complete 8 steps in 20 seconds   Standing Unsupported, One Foot in Front Able to plae foot ahead of the other independently and hold 30 seconds   Standing on One Leg Tries to lift leg/unable to hold 3 seconds but remains standing independently   Total Score 49   High Level Balance   High Level Balance Comments 49/56 improved from 34/56.                     [facility] Adult PT Treatment/Exercise - 05/05/15 1119    High Level Balance   High Level Balance Activities Marching forwards  heel walking/toe walking with intermittent UE support  Balance Exercises - 05/05/15 1308    Balance Exercises: Standing   SLS Eyes open;Upper extremity support 1;3 reps             PT Short Term Goals - 05/05/15 1107    PT SHORT TERM GOAL #1   Title Patient will return demo HEP for LE strength, balance and endurance tasks.   Baseline 8/17/6: pt reports doing most exercise, needed cues on form with ones performed today   Time 4   Period Weeks   Status Partially Met   PT SHORT TERM GOAL #2   Title Improve Berg from 34/56 up to 38/56 to demo decreased risk for falls.   Baseline Improved from 34/56 up to 42/56 on 04/18/2015.   Time 4   Period Weeks   Status Achieved   PT SHORT TERM GOAL #3   Title Improve gait speed from 1.99 ft/sec up to 2.4 ft/sec to demo improving functional mobility.   Baseline Met on 04/18/2015 improving from 1.99 ft/sec up to 2.52 ft/sec.   Time 4   Period Weeks   Status Achieved   PT SHORT TERM GOAL #4   Title Tolerate ambulation x 5 minutes nonstop with least restrictive assistive device for improved household and limited community mobility.    Baseline Met on 04/18/2015 with cues on breathing and standing rest break   Time 4   Period Weeks   Status Achieved   PT SHORT TERM GOAL #5   Title Patient will ambulate without a device x 200 ft with close supervision for improved household mobility.   Baseline Pt requires CGA on 04/18/2015   Time 4   Period Weeks   Status Partially Met           PT Long Term Goals - 05/05/15 1109    PT LONG TERM GOAL #1   Title Verbalize understanding of stroke risk factors and warning signs.   Baseline Reviewed with patient and her daughter.   Time 8   Period Weeks   Status Achieved   PT LONG TERM GOAL #2   Title Improve Berg from 34/56 upt o 44/56 to demo decreasing risk for falls.   Baseline The patient scores 49/56 on Berg balance scale.   Time 8   Period Weeks   Status Achieved   PT LONG TERM GOAL #3   Title Improve gait speed from 1.99 ft/sec up to 2.62 ft/sec to demo transition to limited community ambulator classification of gait.   Baseline Target date 05/25/2015   Time 8   Period Weeks   Status On-going   PT LONG TERM GOAL #4   Title Negotiate 4 steps x 4 reps (to make flight of steps) modified indep with one handrail (baseline=CGA).   Baseline Pt met on 05/05/2015   Time 8   Period Weeks   Status Achieved   PT LONG TERM GOAL #5   Title Improve SIS mobility from 60.1% up to 75% to demo improved functional mobility.   Baseline Target date 05/25/2015   Time 8   Period Weeks   Status On-going               Plan - 05/05/15 1142    Clinical Impression Statement The patient met 2 LTGs.  PT to prepare for d/c and aim for end of next week assessing remaining LTGs and discussing further home safety and floor transfers prior to d/c.   PT Next Visit Plan Plan to d/c week of 9/5 per patient  request.  Review floor transfers, balance HEP (maybe add single limb stance), and discuss home mobility/wellness.   Consulted and Agree with Plan of Care Patient;Family member/caregiver    Family Member Consulted daughter        Problem List Patient Active Problem List   Diagnosis Date Noted  . Gait disturbance, post-stroke 04/22/2015  . Low TSH level 03/10/2015  . Other constipation 03/10/2015  . Constipation 03/08/2015  . Possible Bradycardia 02/22/2015  . Cerebellar hemorrhage, nontraumatic 02/22/2015  . Benign essential HTN 02/21/2015  . 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 Dizziness   . Hypokalemia   . Chronic combined systolic and diastolic congestive heart failure   . Atrial fibrillation with RVR   . HLD (hyperlipidemia)   . Obstructive hydrocephalus   . ICH (intracerebral hemorrhage) 02/05/2015  . COPD bronchitis 12/30/2014  . GERD (gastroesophageal reflux disease) 12/30/2014  . Bladder cancer 01/01/2014  . Glaucoma 01/01/2014  . DCIS (ductal carcinoma in situ) of breast 01/01/2014    Amil Bouwman, PT 05/05/2015, 1:10 PM  [facility] [address] [place], [place], [address] Phone: [phone]   Fax:  [phone]

## 2015-05-10 ENCOUNTER — Ambulatory Visit: Payer: Medicare Other | Admitting: Occupational Therapy

## 2015-05-10 ENCOUNTER — Encounter: Payer: Self-pay | Admitting: Occupational Therapy

## 2015-05-10 ENCOUNTER — Encounter: Payer: Self-pay | Admitting: Rehabilitative and Restorative Service Providers"

## 2015-05-10 ENCOUNTER — Ambulatory Visit: Payer: Medicare Other | Admitting: Rehabilitative and Restorative Service Providers"

## 2015-05-10 VITALS — HR 86

## 2015-05-10 DIAGNOSIS — M6281 Muscle weakness (generalized): Secondary | ICD-10-CM

## 2015-05-10 DIAGNOSIS — I69898 Other sequelae of other cerebrovascular disease: Secondary | ICD-10-CM | POA: Diagnosis not present

## 2015-05-10 DIAGNOSIS — R6889 Other general symptoms and signs: Secondary | ICD-10-CM

## 2015-05-10 DIAGNOSIS — R279 Unspecified lack of coordination: Principal | ICD-10-CM

## 2015-05-10 DIAGNOSIS — R269 Unspecified abnormalities of gait and mobility: Secondary | ICD-10-CM | POA: Diagnosis not present

## 2015-05-10 DIAGNOSIS — R531 Weakness: Secondary | ICD-10-CM | POA: Diagnosis not present

## 2015-05-10 DIAGNOSIS — R278 Other lack of coordination: Secondary | ICD-10-CM

## 2015-05-10 DIAGNOSIS — IMO0002 Reserved for concepts with insufficient information to code with codable children: Secondary | ICD-10-CM

## 2015-05-10 NOTE — Therapy (Signed)
[facility] [address] [place], [place], [address] Phone: [phone]   Fax:  [phone]  Patient Details  Name: [name] MRN: [id number] Date of Birth: [birth year] Referring Provider:  No ref. provider found  Encounter Date: 05/10/2015  PHYSICAL THERAPY DISCHARGE SUMMARY  Visits from Start of Care: 14  Current functional level related to goals / functional outcomes:     PT Short Term Goals - 05/05/15 1107    PT SHORT TERM GOAL #1   Title Patient will return demo HEP for LE strength, balance and endurance tasks.   Baseline 8/17/6: pt reports doing most exercise, needed cues on form with ones performed today   Time 4   Period Weeks   Status Partially Met   PT SHORT TERM GOAL #2   Title Improve Berg from 34/56 up to 38/56 to demo decreased risk for falls.   Baseline Improved from 34/56 up to 42/56 on 04/18/2015.   Time 4   Period Weeks   Status Achieved   PT SHORT TERM GOAL #3   Title Improve gait speed from 1.99 ft/sec up to 2.4 ft/sec to demo improving functional mobility.   Baseline Met on 04/18/2015 improving from 1.99 ft/sec up to 2.52 ft/sec.   Time 4   Period Weeks   Status Achieved   PT SHORT TERM GOAL #4   Title Tolerate ambulation x 5 minutes nonstop with least restrictive assistive device for improved household and limited community mobility.   Baseline Met on 04/18/2015 with cues on breathing and standing rest break   Time 4   Period Weeks   Status Achieved   PT SHORT TERM GOAL #5   Title Patient will ambulate without a device x 200 ft with close supervision for improved household mobility.   Baseline Pt requires CGA on 04/18/2015   Time 4   Period Weeks   Status Partially Met         PT Long Term Goals - 05/10/15 1109    PT LONG TERM GOAL #1   Title Verbalize understanding of stroke risk factors and warning signs.   Baseline Reviewed with patient and her daughter.   Time 8   Period  Weeks   Status Achieved   PT LONG TERM GOAL #2   Title Improve Berg from 34/56 upt o 44/56 to demo decreasing risk for falls.   Baseline The patient scores 49/56 on Berg balance scale.   Time 8   Period Weeks   Status Achieved   PT LONG TERM GOAL #3   Title Improve gait speed from 1.99 ft/sec up to 2.62 ft/sec to demo transition to limited community ambulator classification of gait.   Baseline Patient improved from 1.99 ft/sec up to 2.42 ft/sec.     Time 8   Period Weeks   Status Partially Met   PT LONG TERM GOAL #4   Title Negotiate 4 steps x 4 reps (to make flight of steps) modified indep with one handrail (baseline=CGA).   Baseline Pt met on 05/05/2015   Time 8   Period Weeks   Status Achieved   PT LONG TERM GOAL #5   Title Improve SIS mobility from 60.1% up to 75% to demo improved functional mobility.   Baseline Patient improved from 60.1% up to 94.4% on SIS mobility.   Time 8   Period Weeks   Status Achieved        Remaining deficits: Decreased endurance from h/o COPD Decreased high  level balance Difficulty negotiating unlevel/community surfaces during dynamic gait activities Decreased safety awareness   Education / Equipment: HEP, home safety, use of device.  Plan: Patient agrees to discharge.  Patient goals were met. Patient is being discharged due to meeting the stated rehab goals.  ????? Thank you for the referral of this patient. Rudell Cobb, MPT            [place], PT 05/10/2015, 11:14 AM  [facility] [address] [place], [place], [address] Phone: [phone]   Fax:  [phone]

## 2015-05-10 NOTE — Therapy (Signed)
[facility] [address] [place], [place], [address] Phone: [phone]   Fax:  [phone]  Physical Therapy Treatment  Patient Details  Name: [name] MRN: [id number] Date of Birth: [birth year] Referring Provider:  Janith Lima, MD  Encounter Date: 05/10/2015      PT End of Session - 05/10/15 1051    Visit Number 14   Number of Visits 16   Date for PT Re-Evaluation 05/25/15   Authorization Type G code every 10th visit   PT Start Time 1025   PT Stop Time 1050   PT Time Calculation (min) 25 min   Equipment Utilized During Treatment Gait belt   Activity Tolerance Patient tolerated treatment well   Behavior During Therapy [facility] for tasks assessed/performed      Past Medical History  Diagnosis Date  . History of CHF (congestive heart failure)     POST SURG 2010  . COPD (chronic obstructive pulmonary disease)   . History of breast cancer     DX DCIS IN 2004--  S/P RIGHT MASTECTOMY , NO CHEMORADIATION---  NO RECURRENCE  . Glaucoma of both eyes   . History of atrial fibrillation without current medication 2010    POST SURG 2010--  PER PT NO ISSUES SINCE  . Arthritis   . Dyspnea on exertion   . GERD (gastroesophageal reflux disease)   . Nephrolithiasis   . History of shingles     02/ 2015--  back of neck and left flank--  no residual pain  . History of hypertension   . Recurrent bladder papillary carcinoma first dx 06/ 2014    s/p  turbt's  and instillation mitomycin c (chemo)  . ICH (intracerebral hemorrhage) 02/05/2015    Past Surgical History  Procedure Laterality Date  . Total knee arthroplasty Right 03-22-2009  . Partial mastectomy with needle localization Right 01-14-2003    DCIS  . Total mastectomy Right 02-03-2003    W/ SLN BX  AND  POST 03-01-2003 EVACUATION HEMATOMA  . Removal cyst left hand  2013  . Transthoracic echocardiogram  03-25-2009    MILD LVF/ EF 70-80%/ MILD INCREASE SYSTOLIC  PULMONARY  PRESSURE  . Transurethral resection of bladder tumor with gyrus (turbt-gyrus) N/A 03/26/2013    Procedure: TRANSURETHRAL RESECTION OF BLADDER TUMOR WITH GYRUS (TURBT-GYRUS);  Surgeon: Alexis Frock, MD;  Location: [facility];  Service: Urology;  Laterality: N/A;  . Cystoscopy w/ ureteral stent placement Bilateral 03/26/2013    Procedure: CYSTOSCOPY WITH BILATERAL RETROGRADE PYELOGRAM/URETERAL STENT PLACEMENT;  Surgeon: Alexis Frock, MD;  Location: [facility];  Service: Urology;  Laterality: Bilateral;  . Transurethral resection of bladder tumor with gyrus (turbt-gyrus) N/A 05/13/2013    Procedure: TRANSURETHRAL RESECTION OF BLADDER TUMOR WITH GYRUS (TURBT-GYRUS)  RE-STAGING TRANSURETHRAL RESECTION OF BLADDER TUMOR, LEFT RETROGRADE PYELOGRAM AND STENT EXCHANGE;  Surgeon: Alexis Frock, MD;  Location: [facility];  Service: Urology;  Laterality: N/A;  . Cystoscopy w/ ureteral stent placement Left 05/13/2013    Procedure: CYSTOSCOPY WITH RETROGRADE PYELOGRAM/URETERAL STENT PLACEMENT STENT EXCHANGE;  Surgeon: Alexis Frock, MD;  Location[facility];  Service: Urology;  Laterality: Left;  . Tubal ligation    . Cataract extraction w/ intraocular lens  implant, bilateral    . Transurethral resection of bladder tumor with gyrus (turbt-gyrus) N/A 08/11/2014    Procedure: TRANSURETHRAL RESECTION OF BLADDER TUMOR WITH GYRUS (TURBT-GYRUS);  Surgeon: Alexis Frock, MD;  Location: [facility];  Service: Urology;  Laterality: N/A;  . Cystoscopy w/ retrogrades Bilateral 08/11/2014    Procedure: CYSTOSCOPY WITH BILATERAL RETROGRADE PYELOGRAM AND MITOMYCIN INSTILLATION;  Surgeon: Alexis Frock, MD;  Location: [facility];  Service: Urology;  Laterality: Bilateral;  . Craniectomy N/A 02/05/2015    Procedure: SUBOCCIPITAL CRANIECTOMY EVACUATION OF HEMATOMA;  Surgeon: Consuella Lose, MD;  Location: [facility];   Service: Neurosurgery;  Laterality: N/A;    There were no vitals filed for this visit.  Visit Diagnosis:  Abnormality of gait  Generalized muscle weakness      Subjective Assessment - 05/10/15 1025    Subjective The patient reports that she is not feeling as limited in mobility.  She reports that she has not been using the cane indoors.  Her daughter is concerned about how much she is reaching out to grab walls, countertops.  She reports she almost fell over the weekend when bending down to pull weeds.    Patient Stated Goals Return to prior level of function (cane at night due to spinal curve), no device during the day   Currently in Pain? No/denies      Gait: 9M=2.48 ft/sec without device on level surfaces Community ambulation with SPC needing cues to use device (patient continues to carry in L hand) x 150 feet Functional gait negotiating pine straw and unlevel sidewalks, curbs with supervision to CGA and bending to pick weed from ground (to mimic home activities)  THERAPEUTIC ACTIVITIES: Floor<>stand transfers discussing scooting on bottom or crawling to support surface for improved safety during floor>stand Also discussed how to crawl to step in order to use step to help move floor>stand if outdoors  SELF CARE/HOME MANAGEMENT: Discussed use of single point cane in home and community.  Patient does not use device in home and carries it in community. PT reviewed risk of falls and emphasized need for continued use of device.          PT Education - 05/10/15 1055    Education provided Yes   Education Details Fall prevention, safety, getting up from the floor, use of cane.   Person(s) Educated Patient;Child(ren)   Methods Explanation;Demonstration   Comprehension Verbalized understanding;Returned demonstration          PT Short Term Goals - 05/05/15 1107    PT SHORT TERM GOAL #1   Title Patient will return demo HEP for LE strength, balance and endurance tasks.    Baseline 8/17/6: pt reports doing most exercise, needed cues on form with ones performed today   Time 4   Period Weeks   Status Partially Met   PT SHORT TERM GOAL #2   Title Improve Berg from 34/56 up to 38/56 to demo decreased risk for falls.   Baseline Improved from 34/56 up to 42/56 on 04/18/2015.   Time 4   Period Weeks   Status Achieved   PT SHORT TERM GOAL #3   Title Improve gait speed from 1.99 ft/sec up to 2.4 ft/sec to demo improving functional mobility.   Baseline Met on 04/18/2015 improving from 1.99 ft/sec up to 2.52 ft/sec.   Time 4   Period Weeks   Status Achieved   PT SHORT TERM GOAL #4   Title Tolerate ambulation x 5 minutes nonstop with least restrictive assistive device for improved household and limited community mobility.   Baseline Met on 04/18/2015 with cues on breathing and standing rest break   Time 4   Period Weeks   Status Achieved   PT  SHORT TERM GOAL #5   Title Patient will ambulate without a device x 200 ft with close supervision for improved household mobility.   Baseline Pt requires CGA on 04/18/2015   Time 4   Period Weeks   Status Partially Met           PT Long Term Goals - 05/10/15 1109    PT LONG TERM GOAL #1   Title Verbalize understanding of stroke risk factors and warning signs.   Baseline Reviewed with patient and her daughter.   Time 8   Period Weeks   Status Achieved   PT LONG TERM GOAL #2   Title Improve Berg from 34/56 upt o 44/56 to demo decreasing risk for falls.   Baseline The patient scores 49/56 on Berg balance scale.   Time 8   Period Weeks   Status Achieved   PT LONG TERM GOAL #3   Title Improve gait speed from 1.99 ft/sec up to 2.62 ft/sec to demo transition to limited community ambulator classification of gait.   Baseline Patient improved from 1.99 ft/sec up to 2.48 ft/sec.     Time 8   Period Weeks   Status Partially Met   PT LONG TERM GOAL #4   Title Negotiate 4 steps x 4 reps (to make flight of steps) modified  indep with one handrail (baseline=CGA).   Baseline Pt met on 05/05/2015   Time 8   Period Weeks   Status Achieved   PT LONG TERM GOAL #5   Title Improve SIS mobility from 60.1% up to 75% to demo improved functional mobility.   Baseline Patient improved from 60.1% up to 94.4% on SIS mobility.   Time 8   Period Weeks   Status Achieved               Plan - 05/10/15 1110    Clinical Impression Statement The patient requests early d/c.  She feels she has returned to prior level of function.  She has met most LTGs with the exception of gait speed, which has improved, but not to the level of transitioning to "full community ambulator" classification of gait.  PT has educated patient on fall risk and safety.   PT Next Visit Plan discharge today.   Consulted and Agree with Plan of Care Patient;Family member/caregiver   Family Member Consulted daughter        Problem List Patient Active Problem List   Diagnosis Date Noted  . Gait disturbance, post-stroke 04/22/2015  . Low TSH level 03/10/2015  . Other constipation 03/10/2015  . Constipation 03/08/2015  . Possible Bradycardia 02/22/2015  . Cerebellar hemorrhage, nontraumatic 02/22/2015  . Benign essential HTN 02/21/2015  . 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 Dizziness   . Hypokalemia   . Chronic combined systolic and diastolic congestive heart failure   . Atrial fibrillation with RVR   . HLD (hyperlipidemia)   . Obstructive hydrocephalus   . ICH (intracerebral hemorrhage) 02/05/2015  . COPD bronchitis 12/30/2014  . GERD (gastroesophageal reflux disease) 12/30/2014  . Bladder cancer 01/01/2014  . Glaucoma 01/01/2014  . DCIS (ductal carcinoma in situ) of breast 01/01/2014    Leylani Duley, PT 05/10/2015, 11:11 AM  [facility] [address] [place], [place], [address] Phone: [phone]   Fax:  [phone]

## 2015-05-10 NOTE — Patient Instructions (Signed)
Resisted Horizontal Abduction: Bilateral   Sit , tubing in both hands, arms out in front. Keeping arms straight, pinch shoulder blades together and stretch arms out. Complete 10 repetitions. Perform 1-2 sessions per day. Repeat with other arm.  RED BAND   Elbow Flexion: Resisted   Hold tubing wrapped around One hand and and other end secured under foot, curl arm up as far as possible. Complete 10 repetitions. Perform 1-2 sessions per day. Repeat with other arm.  RED BAND  FLEXION: Sitting - Resistance Band (Active)   Sit with right arm down. Against yellow resistance band, lift arm forward and up as high as possible, keeping elbow straight and thumb up. Complete 10 repetitions. Perform 1-2 sessions per day. Repeat with other arm. (Go to shoulder height)  YELLOW BAND   Copyright  VHI. All rights reserved.  ABDUCTION: Sitting - Resistance Band (Active)   Sit with right arm down. Against yellow resistance band, lift arm out to side and up as high as possible, keeping elbow straight and thumb up. Complete 10 repetitions. Perform 1-2 sessions per day. Repeat with other arm.  YELLOW BAND  Copyright  VHI. All rights reserved.  Extension (Resistive Band)   Holding band around hand and wrist, use elbow movements only. Using other arm as anchor, straighten elbow, pushing down. Hold 3 seconds. Complete 10 repetitions. Perform 1-2 sessions per day. Repeat with other arm.  RED BAND Copyright  VHI. All rights reserved.

## 2015-05-10 NOTE — Therapy (Signed)
[facility] [address] [place], [place], [address] Phone: [phone]   Fax:  [phone]  Occupational Therapy Treatment  Patient Details  Name: [name] MRN: [id number] Date of Birth: 1935/06/21 Referring Provider:  Janith Lima, MD  Encounter Date: 05/10/2015      OT End of Session - 05/10/15 0940    Visit Number 12   Number of Visits 17   Date for OT Re-Evaluation 05/22/15   Authorization Type Medicare   Authorization Time Period plan to d/c 05/22/15   Authorization - Visit Number 12   Authorization - Number of Visits 20   OT Start Time 0935   OT Stop Time 1015   OT Time Calculation (min) 40 min   Activity Tolerance Patient tolerated treatment well   Behavior During Therapy [facility] for tasks assessed/performed      Past Medical History  Diagnosis Date  . History of CHF (congestive heart failure)     POST SURG 2010  . COPD (chronic obstructive pulmonary disease)   . History of breast cancer     DX DCIS IN 2004--  S/P RIGHT MASTECTOMY , NO CHEMORADIATION---  NO RECURRENCE  . Glaucoma of both eyes   . History of atrial fibrillation without current medication 2010    POST SURG 2010--  PER PT NO ISSUES SINCE  . Arthritis   . Dyspnea on exertion   . GERD (gastroesophageal reflux disease)   . Nephrolithiasis   . History of shingles     02/ 2015--  back of neck and left flank--  no residual pain  . History of hypertension   . Recurrent bladder papillary carcinoma first dx 06/ 2014    s/p  turbt's  and instillation mitomycin c (chemo)  . ICH (intracerebral hemorrhage) 02/05/2015    Past Surgical History  Procedure Laterality Date  . Total knee arthroplasty Right 03-22-2009  . Partial mastectomy with needle localization Right 01-14-2003    DCIS  . Total mastectomy Right 02-03-2003    W/ SLN BX  AND  POST 03-01-2003 EVACUATION HEMATOMA  . Removal cyst left hand  2013  . Transthoracic  echocardiogram  03-25-2009    MILD LVF/ EF 70-80%/ MILD INCREASE SYSTOLIC PULMONARY  PRESSURE  . Transurethral resection of bladder tumor with gyrus (turbt-gyrus) N/A 03/26/2013    Procedure: TRANSURETHRAL RESECTION OF BLADDER TUMOR WITH GYRUS (TURBT-GYRUS);  Surgeon: Alexis Frock, MD;  Location: [facility];  Service: Urology;  Laterality: N/A;  . Cystoscopy w/ ureteral stent placement Bilateral 03/26/2013    Procedure: CYSTOSCOPY WITH BILATERAL RETROGRADE PYELOGRAM/URETERAL STENT PLACEMENT;  Surgeon: Alexis Frock, MD;  Location: [facility];  Service: Urology;  Laterality: Bilateral;  . Transurethral resection of bladder tumor with gyrus (turbt-gyrus) N/A 05/13/2013    Procedure: TRANSURETHRAL RESECTION OF BLADDER TUMOR WITH GYRUS (TURBT-GYRUS)  RE-STAGING TRANSURETHRAL RESECTION OF BLADDER TUMOR, LEFT RETROGRADE PYELOGRAM AND STENT EXCHANGE;  Surgeon: Alexis Frock, MD;  Location: [facility];  Service: Urology;  Laterality: N/A;  . Cystoscopy w/ ureteral stent placement Left 05/13/2013    Procedure: CYSTOSCOPY WITH RETROGRADE PYELOGRAM/URETERAL STENT PLACEMENT STENT EXCHANGE;  Surgeon: Alexis Frock, MD;  Location: [facility];  Service: Urology;  Laterality: Left;  . Tubal ligation    . Cataract extraction w/ intraocular lens  implant, bilateral    . Transurethral resection of bladder tumor with gyrus (turbt-gyrus) N/A 08/11/2014    Procedure: TRANSURETHRAL RESECTION OF BLADDER TUMOR WITH GYRUS (TURBT-GYRUS);  Surgeon: Alexis Frock, MD;  Location: [facility];  Service: Urology;  Laterality: N/A;  . Cystoscopy w/ retrogrades Bilateral 08/11/2014    Procedure: CYSTOSCOPY WITH BILATERAL RETROGRADE PYELOGRAM AND MITOMYCIN INSTILLATION;  Surgeon: Alexis Frock, MD;  Location: [facility];  Service: Urology;  Laterality: Bilateral;  . Craniectomy N/A 02/05/2015    Procedure: SUBOCCIPITAL CRANIECTOMY EVACUATION  OF HEMATOMA;  Surgeon: Consuella Lose, MD;  Location: [place] [facility];  Service: Neurosurgery;  Laterality: N/A;    Filed Vitals:   05/10/15 1011  Pulse: 86  SpO2: 89%    Visit Diagnosis:  Decreased coordination  Weakness due to cerebrovascular accident  Generalized muscle weakness  Decreased functional activity tolerance      Subjective Assessment - 05/10/15 0939    Subjective  "I am walking"  pt reports writing is "doing better"  and reports that daughter told her that her writing has improved   Patient is accompained by: Family member   Pertinent History see EPIC snap shot   Patient Stated Goals to get back to normal   Currently in Pain? No/denies                      OT Treatments/Exercises (OP) - 05/10/15 0001    ADLs   Home Maintenance Simulated home maintenance activities to retrieve items from various locations with functional reaching.  Ambulated without device as pt reports that she is not using at home.  Pt encouraged to use cane due to initially "furniture walking" and leaning to reach furniture.  Encouraged use of cane for fall risk from furniture walking and leaning.  Pt reports that she is not going to use.  close supervision provided and min v.c.  Pt reports near fall weeding garden and that she uses cane outside.   Writing Practiced writing with approx 90% legibility overall.   Fine Motor Coordination   Fine Motor Coordination Purdue Pegboard   Purdue Pegboard with min difficulty and incr time.                OT Education - 05/10/15 1013    Education Details Reviewed theraband HEP (updated biceps curls, tricep ext, and horizontal abduction to red band)   Person(s) Educated Patient;Child(ren)   Methods Explanation;Demonstration;Verbal cues;Handout   Comprehension Verbalized understanding;Returned demonstration  min v.c.--pt reports that she has not performed lately due to being busy          OT Short Term Goals - 05/10/15 1306     OT SHORT TERM GOAL #1   Title I with HEP.   Baseline v.c. to slow down   Time 4   Period Weeks   Status Partially Met   OT SHORT TERM GOAL #2   Title Pt will demonstrate ability to write a short paragraph with 95% or better accuracy, and minimal decrease in letter size.   Baseline Pt demonstrates ability to write with grossly 80% legibility, pt is able to demonstrate improvement with cues from therapist   Time 4   Period Weeks   Status On-going   OT SHORT TERM GOAL #3   Title Pt will perform all basic ADLs modified independently   Time 4   Period Weeks   Status Achieved  per pt report 04/04/15   OT SHORT TERM GOAL #4   Title Pt will improve RUE fine motor coordination as evidenced by performing 9 hole peg test in 35 secs or less.   Time 4   Period Weeks  Status On-going  04/18/15:  41.43sec           OT Long Term Goals - 04/27/15 1345    OT LONG TERM GOAL #1   Title Pt will perform light home management and basic snack prep modified independently demonstrating good safety awareness.   Status On-going   OT LONG TERM GOAL #2   Title Pt will improve bilateral functional reach score to 10 inches or better, for improved functional reach/ standing balance.   Status On-going   OT LONG TERM GOAL #3   Title Pt will report increased activity tolerance as evidenced by pt report that she is able to stand for home management/ ADLs for 45 mins-1 hour prior to rest break.   Status On-going               Plan - 05/10/15 1306    Clinical Impression Statement Pt is progressing towards goals for writing.  Pt continues to need min v.c. for safety with ambulation for IADLs.   Plan strength, standing balance, coordination, safety   OT Home Exercise Plan Issued:  03/29/15 yellow theraband HEP; 05/10/15:  updated theraband for some ex to red (low range--see pt instructions)   Consulted and Agree with Plan of Care Patient   Family Member Consulted daughter        Problem List Patient  Active Problem List   Diagnosis Date Noted  . Gait disturbance, post-stroke 04/22/2015  . Low TSH level 03/10/2015  . Other constipation 03/10/2015  . Constipation 03/08/2015  . Possible Bradycardia 02/22/2015  . Cerebellar hemorrhage, nontraumatic 02/22/2015  . Benign essential HTN 02/21/2015  . 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 Dizziness   . Hypokalemia   . Chronic combined systolic and diastolic congestive heart failure   . Atrial fibrillation with RVR   . HLD (hyperlipidemia)   . Obstructive hydrocephalus   . ICH (intracerebral hemorrhage) 02/05/2015  . COPD bronchitis 12/30/2014  . GERD (gastroesophageal reflux disease) 12/30/2014  . Bladder cancer 01/01/2014  . Glaucoma 01/01/2014  . DCIS (ductal carcinoma in situ) of breast 01/01/2014    [facility] 05/10/2015, 1:08 PM  [facility] [address] [place], [place], [address] Phone: [phone]   Fax:  E[place], OTR/L 05/10/2015 1:08 PM

## 2015-05-11 ENCOUNTER — Ambulatory Visit: Payer: Medicare Other | Admitting: Rehabilitative and Restorative Service Providers"

## 2015-05-11 ENCOUNTER — Ambulatory Visit: Payer: Medicare Other | Admitting: Occupational Therapy

## 2015-05-11 DIAGNOSIS — R6889 Other general symptoms and signs: Secondary | ICD-10-CM

## 2015-05-11 DIAGNOSIS — R531 Weakness: Secondary | ICD-10-CM | POA: Diagnosis not present

## 2015-05-11 DIAGNOSIS — M6281 Muscle weakness (generalized): Secondary | ICD-10-CM

## 2015-05-11 DIAGNOSIS — R279 Unspecified lack of coordination: Principal | ICD-10-CM

## 2015-05-11 DIAGNOSIS — I69898 Other sequelae of other cerebrovascular disease: Secondary | ICD-10-CM | POA: Diagnosis not present

## 2015-05-11 DIAGNOSIS — R278 Other lack of coordination: Secondary | ICD-10-CM

## 2015-05-11 DIAGNOSIS — IMO0002 Reserved for concepts with insufficient information to code with codable children: Secondary | ICD-10-CM

## 2015-05-11 DIAGNOSIS — R269 Unspecified abnormalities of gait and mobility: Secondary | ICD-10-CM | POA: Diagnosis not present

## 2015-05-11 NOTE — Patient Instructions (Signed)
1. Grip Strengthening (Resistive Putty)   Squeeze putty using thumb and all fingers. Repeat _20___ times. Do __2__ sessions per day.   2. Roll putty into tube on table and pinch between each finger and thumb x 10 reps each. (can do ring and small finger together)     Copyright  VHI. All rights reserved.

## 2015-05-12 ENCOUNTER — Encounter: Payer: Medicare Other | Admitting: Occupational Therapy

## 2015-05-12 ENCOUNTER — Ambulatory Visit: Payer: Medicare Other | Admitting: Physical Therapy

## 2015-05-12 NOTE — Therapy (Signed)
[facility] [address] [place], [place], [address] Phone: [phone]   Fax:  [phone]  Occupational Therapy Treatment  Patient Details  Name: [name] MRN: [id number] Date of Birth: 11-03-1934 Referring Provider:  Janith Lima, MD  Encounter Date: 05/11/2015      OT End of Session - 05/12/15 2053    Visit Number 13   Number of Visits 17   Date for OT Re-Evaluation 05/22/15   Authorization Type Medicare   Authorization Time Period plan to d/c 05/22/15   Authorization - Visit Number 13   Authorization - Number of Visits 20   OT Start Time 4081   OT Stop Time 1015   OT Time Calculation (min) 40 min      Past Medical History  Diagnosis Date  . History of CHF (congestive heart failure)     POST SURG 2010  . COPD (chronic obstructive pulmonary disease)   . History of breast cancer     DX DCIS IN 2004--  S/P RIGHT MASTECTOMY , NO CHEMORADIATION---  NO RECURRENCE  . Glaucoma of both eyes   . History of atrial fibrillation without current medication 2010    POST SURG 2010--  PER PT NO ISSUES SINCE  . Arthritis   . Dyspnea on exertion   . GERD (gastroesophageal reflux disease)   . Nephrolithiasis   . History of shingles     02/ 2015--  back of neck and left flank--  no residual pain  . History of hypertension   . Recurrent bladder papillary carcinoma first dx 06/ 2014    s/p  turbt's  and instillation mitomycin c (chemo)  . ICH (intracerebral hemorrhage) 02/05/2015    Past Surgical History  Procedure Laterality Date  . Total knee arthroplasty Right 03-22-2009  . Partial mastectomy with needle localization Right 01-14-2003    DCIS  . Total mastectomy Right 02-03-2003    W/ SLN BX  AND  POST 03-01-2003 EVACUATION HEMATOMA  . Removal cyst left hand  2013  . Transthoracic echocardiogram  03-25-2009    MILD LVF/ EF 70-80%/ MILD INCREASE SYSTOLIC PULMONARY  PRESSURE  . Transurethral resection of  bladder tumor with gyrus (turbt-gyrus) N/A 03/26/2013    Procedure: TRANSURETHRAL RESECTION OF BLADDER TUMOR WITH GYRUS (TURBT-GYRUS);  Surgeon: Alexis Frock, MD;  [facility];  Service: Urology;  Laterality: N/A;  . Cystoscopy w/ ureteral stent placement Bilateral 03/26/2013    Procedure: CYSTOSCOPY WITH BILATERAL RETROGRADE PYELOGRAM/URETERAL STENT PLACEMENT;  Surgeon: Alexis Frock, MD;  Location: [facility];  Service: Urology;  Laterality: Bilateral;  . Transurethral resection of bladder tumor with gyrus (turbt-gyrus) N/A 05/13/2013    Procedure: TRANSURETHRAL RESECTION OF BLADDER TUMOR WITH GYRUS (TURBT-GYRUS)  RE-STAGING TRANSURETHRAL RESECTION OF BLADDER TUMOR, LEFT RETROGRADE PYELOGRAM AND STENT EXCHANGE;  Surgeon: Alexis Frock, MD;  Location: [facility];  Service: Urology;  Laterality: N/A;  . Cystoscopy w/ ureteral stent placement Left 05/13/2013    Procedure: CYSTOSCOPY WITH RETROGRADE PYELOGRAM/URETERAL STENT PLACEMENT STENT EXCHANGE;  Surgeon: Alexis Frock, MD;  Location: [facility];  Service: Urology;  Laterality: Left;  . Tubal ligation    . Cataract extraction w/ intraocular lens  implant, bilateral    . Transurethral resection of bladder tumor with gyrus (turbt-gyrus) N/A 08/11/2014    Procedure: TRANSURETHRAL RESECTION OF BLADDER TUMOR WITH GYRUS (TURBT-GYRUS);  Surgeon: Alexis Frock, MD;  Location: [facility];  Service: Urology;  Laterality: N/A;  .  Cystoscopy w/ retrogrades Bilateral 08/11/2014    Procedure: CYSTOSCOPY WITH BILATERAL RETROGRADE PYELOGRAM AND MITOMYCIN INSTILLATION;  Surgeon: Alexis Frock, MD;  Location: [facility];  Service: Urology;  Laterality: Bilateral;  . Craniectomy N/A 02/05/2015    Procedure: SUBOCCIPITAL CRANIECTOMY EVACUATION OF HEMATOMA;  Surgeon: Consuella Lose, MD;  Location: [facility] [facility];  Service: Neurosurgery;  Laterality: N/A;    There  were no vitals filed for this visit.  Visit Diagnosis:  Decreased coordination  Weakness due to cerebrovascular accident  Generalized muscle weakness  Decreased functional activity tolerance                    OT Treatments/Exercises (OP) - 05/12/15 0001    ADLs   Home Maintenance Pt performed simple cooking task modified indpendently demonstrating improved safety awareness and no LOB.      Theraputic ex: Pt was educated in putty HEP and returned demonstration, reviewed theraband HEP, pt returned demonstration of all education..              OT Short Term Goals - 05-17-2015 9381    OT SHORT TERM GOAL #1   Title I with HEP.   Baseline --   Time 4   Period Weeks   Status Achieved   OT SHORT TERM GOAL #2   Title Pt will demonstrate ability to write a short paragraph with 95% or better accuracy, and minimal decrease in letter size.   Baseline met pt demonstrates 95 % accuracy.   Time 4   Period Weeks   Status Achieved   OT SHORT TERM GOAL #3   Title Pt will perform all basic ADLs modified independently   Time 4   Period Weeks   Status Achieved  per pt report 04/04/15   OT SHORT TERM GOAL #4   Title Pt will improve RUE fine motor coordination as evidenced by performing 9 hole peg test in 35 secs or less.   Baseline 36.31 sec-not met yet improved   Time 4   Period Weeks   Status Not Met  04/18/15:  41.43sec           OT Long Term Goals - May 17, 2015 0939    OT LONG TERM GOAL #1   Title Pt will perform light home management and basic snack prep modified independently demonstrating good safety awareness.   Baseline Pt performed simple cooking task modified independently.   Status Achieved   OT LONG TERM GOAL #2   Title Pt will improve bilateral functional reach score to 10 inches or better, for improved functional reach/ standing balance.   Status Partially Met(met for LUE)   OT LONG TERM GOAL #3   Title Pt will report increased activity tolerance as  evidenced by pt report that she is able to stand for home management/ ADLs for 45 mins-1 hour prior to rest break.   Status Achieved                 G-Codes - 05/17/2015 10-14-52    Functional Assessment Tool Used RUE 9 hole peg test:36.31 secs, able to perform simple cooking task modified independently.   Functional Limitation Self care   Self Care Goal Status [phone]) At least 1 percent but less than 20 percent impaired, limited or restricted   Self Care Discharge Status [id number]) At least 1 percent but less than 20 percent impaired, limited or restricted      Problem List Patient Active Problem List   Diagnosis Date Noted  .  Gait disturbance, post-stroke 04/22/2015  . Low TSH level 03/10/2015  . Other constipation 03/10/2015  . Constipation 03/08/2015  . Possible Bradycardia 02/22/2015  . Cerebellar hemorrhage, nontraumatic 02/22/2015  . Benign essential HTN 02/21/2015  . 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 Dizziness   . Hypokalemia   . Chronic combined systolic and diastolic congestive heart failure   . Atrial fibrillation with RVR   . HLD (hyperlipidemia)   . Obstructive hydrocephalus   . ICH (intracerebral hemorrhage) 02/05/2015  . COPD bronchitis 12/30/2014  . GERD (gastroesophageal reflux disease) 12/30/2014  . Bladder cancer 01/01/2014  . Glaucoma 01/01/2014  . DCIS (ductal carcinoma in situ) of breast 01/01/2014    [facility] 05/12/2015, 8:56 PM Theone Murdoch, OTR/L [phone] Phone: [phone] 8:56 PM 05/12/2015 [facility] [address] [place], [place], [address] Phone: [phone]   Fax:  [phone]

## 2015-05-16 ENCOUNTER — Ambulatory Visit: Payer: Medicare Other | Admitting: Rehabilitative and Restorative Service Providers"

## 2015-05-16 ENCOUNTER — Ambulatory Visit: Payer: Medicare Other | Admitting: Occupational Therapy

## 2015-05-18 ENCOUNTER — Ambulatory Visit: Payer: Medicare Other | Admitting: Rehabilitative and Restorative Service Providers"

## 2015-05-18 ENCOUNTER — Ambulatory Visit: Payer: Medicare Other | Admitting: Occupational Therapy

## 2015-05-19 DIAGNOSIS — H538 Other visual disturbances: Secondary | ICD-10-CM | POA: Diagnosis not present

## 2015-05-19 DIAGNOSIS — J449 Chronic obstructive pulmonary disease, unspecified: Secondary | ICD-10-CM | POA: Diagnosis not present

## 2015-05-19 DIAGNOSIS — I69098 Other sequelae following nontraumatic subarachnoid hemorrhage: Secondary | ICD-10-CM | POA: Diagnosis not present

## 2015-05-19 DIAGNOSIS — I5030 Unspecified diastolic (congestive) heart failure: Secondary | ICD-10-CM | POA: Diagnosis not present

## 2015-05-20 ENCOUNTER — Encounter: Payer: Self-pay | Admitting: Physical Medicine & Rehabilitation

## 2015-05-20 ENCOUNTER — Encounter: Payer: Medicare Other | Attending: Physical Medicine & Rehabilitation

## 2015-05-20 ENCOUNTER — Ambulatory Visit (HOSPITAL_BASED_OUTPATIENT_CLINIC_OR_DEPARTMENT_OTHER): Payer: Medicare Other | Admitting: Physical Medicine & Rehabilitation

## 2015-05-20 VITALS — BP 124/64 | HR 80

## 2015-05-20 DIAGNOSIS — I614 Nontraumatic intracerebral hemorrhage in cerebellum: Secondary | ICD-10-CM

## 2015-05-20 DIAGNOSIS — R269 Unspecified abnormalities of gait and mobility: Secondary | ICD-10-CM

## 2015-05-20 DIAGNOSIS — R42 Dizziness and giddiness: Secondary | ICD-10-CM | POA: Diagnosis present

## 2015-05-20 DIAGNOSIS — I10 Essential (primary) hypertension: Secondary | ICD-10-CM | POA: Diagnosis not present

## 2015-05-20 DIAGNOSIS — K219 Gastro-esophageal reflux disease without esophagitis: Secondary | ICD-10-CM | POA: Insufficient documentation

## 2015-05-20 DIAGNOSIS — R110 Nausea: Secondary | ICD-10-CM | POA: Diagnosis present

## 2015-05-20 DIAGNOSIS — Z853 Personal history of malignant neoplasm of breast: Secondary | ICD-10-CM | POA: Insufficient documentation

## 2015-05-20 DIAGNOSIS — R270 Ataxia, unspecified: Secondary | ICD-10-CM

## 2015-05-20 DIAGNOSIS — I4891 Unspecified atrial fibrillation: Secondary | ICD-10-CM | POA: Insufficient documentation

## 2015-05-20 DIAGNOSIS — Z87891 Personal history of nicotine dependence: Secondary | ICD-10-CM | POA: Insufficient documentation

## 2015-05-20 DIAGNOSIS — I69398 Other sequelae of cerebral infarction: Secondary | ICD-10-CM

## 2015-05-20 DIAGNOSIS — J449 Chronic obstructive pulmonary disease, unspecified: Secondary | ICD-10-CM | POA: Insufficient documentation

## 2015-05-20 NOTE — Progress Notes (Signed)
Subjective:    Patient ID: [name], female    DOB: September 04, 1934, 80 y.o.   MRN: [id number]  HPI 80 year old female with history of right cerebellar hemorrhagic infarct. She underwent subacute suboccipital craniotomy. She has a history of atrial fibrillation and is followed by cardiology. Her neurosurgeon advised against anticoagulation due to history of intracranial hemorrhage. She is accompanied by her daughter Independent With dressing and bathing, at home alone during the day.Lives with her husband who still works. Doing house work - washing and ironing No falls  vision problems sees Dr Satira Sark for glaucoma, s/p cataract repair Has f/u with Urology for cystoscopy Has completed outpatient PT and OT. Pain Inventory Average Pain 0 Pain Right Now 0 My pain is no pain  In the last 24 hours, has pain interfered with the following? General activity 0 Relation with others 0 Enjoyment of life 0 What TIME of day is your pain at its worst? no pain Sleep (in general) NA  Pain is worse with: no pain Pain improves with: no pain Relief from Meds: no pain  Mobility walk without assistance use a walker ability to climb steps?  yes do you drive?  yes  Function retired  Neuro/Psych No problems in this area  Prior Studies Any changes since last visit?  no  Physicians involved in your care Any changes since last visit?  no   Family History  Problem Relation Age of Onset  . Colon cancer Mother 2  . Colon cancer Maternal Grandmother   . Breast cancer Maternal Aunt 65  . Cancer Maternal Aunt     ? ovarian  . Breast cancer Other 60    breast cancer, ovarian cancer  . Breast cancer Daughter 70  . Heart attack Neg Hx   . Stroke Paternal Grandmother   . Stroke Maternal Grandmother   . Hypertension Neg Hx    Social History   Social History  . Marital Status: Married    Spouse Name: N/A  . Number of Children: N/A  . Years of Education: N/A   Social History Main  Topics  . Smoking status: Former Smoker -- 1.00 packs/day for 40 years    Types: Cigarettes    Quit date: 03/20/2003  . Smokeless tobacco: Never Used  . Alcohol Use: No  . Drug Use: No  . Sexual Activity: Not Asked   Other Topics Concern  . None   Social History Narrative   Past Surgical History  Procedure Laterality Date  . Total knee arthroplasty Right 03-22-2009  . Partial mastectomy with needle localization Right 01-14-2003    DCIS  . Total mastectomy Right 02-03-2003    W/ SLN BX  AND  POST 03-01-2003 EVACUATION HEMATOMA  . Removal cyst left hand  2013  . Transthoracic echocardiogram  03-25-2009    MILD LVF/ EF 70-80%/ MILD INCREASE SYSTOLIC PULMONARY  PRESSURE  . Transurethral resection of bladder tumor with gyrus (turbt-gyrus) N/A 03/26/2013    Procedure: TRANSURETHRAL RESECTION OF BLADDER TUMOR WITH GYRUS (TURBT-GYRUS);  Surgeon: Alexis Frock, MD;  Location: [facility];  Service: Urology;  Laterality: N/A;  . Cystoscopy w/ ureteral stent placement Bilateral 03/26/2013    Procedure: CYSTOSCOPY WITH BILATERAL RETROGRADE PYELOGRAM/URETERAL STENT PLACEMENT;  Surgeon: Alexis Frock, MD;  Location: [facility];  Service: Urology;  Laterality: Bilateral;  . Transurethral resection of bladder tumor with gyrus (turbt-gyrus) N/A 05/13/2013    Procedure: TRANSURETHRAL RESECTION OF BLADDER TUMOR WITH GYRUS (TURBT-GYRUS)  RE-STAGING TRANSURETHRAL RESECTION  OF BLADDER TUMOR, LEFT RETROGRADE PYELOGRAM AND STENT EXCHANGE;  Surgeon: Alexis Frock, MD;  Location: [facility];  Service: Urology;  Laterality: N/A;  . Cystoscopy w/ ureteral stent placement Left 05/13/2013    Procedure: CYSTOSCOPY WITH RETROGRADE PYELOGRAM/URETERAL STENT PLACEMENT STENT EXCHANGE;  Surgeon: Alexis Frock, MD;  Location: [facility];  Service: Urology;  Laterality: Left;  . Tubal ligation    . Cataract extraction w/ intraocular lens  implant, bilateral      . Transurethral resection of bladder tumor with gyrus (turbt-gyrus) N/A 08/11/2014    Procedure: TRANSURETHRAL RESECTION OF BLADDER TUMOR WITH GYRUS (TURBT-GYRUS);  Surgeon: Alexis Frock, MD;  Location: [facility];  Service: Urology;  Laterality: N/A;  . Cystoscopy w/ retrogrades Bilateral 08/11/2014    Procedure: CYSTOSCOPY WITH BILATERAL RETROGRADE PYELOGRAM AND MITOMYCIN INSTILLATION;  Surgeon: Alexis Frock, MD;  Location[facility];  Service: Urology;  Laterality: Bilateral;  . Craniectomy N/A 02/05/2015    Procedure: SUBOCCIPITAL CRANIECTOMY EVACUATION OF HEMATOMA;  Surgeon: Consuella Lose, MD;  Location: [place] [facility];  Service: Neurosurgery;  Laterality: N/A;   Past Medical History  Diagnosis Date  . History of CHF (congestive heart failure)     POST SURG 2010  . COPD (chronic obstructive pulmonary disease)   . History of breast cancer     DX DCIS IN 2004--  S/P RIGHT MASTECTOMY , NO CHEMORADIATION---  NO RECURRENCE  . Glaucoma of both eyes   . History of atrial fibrillation without current medication 2010    POST SURG 2010--  PER PT NO ISSUES SINCE  . Arthritis   . Dyspnea on exertion   . GERD (gastroesophageal reflux disease)   . Nephrolithiasis   . History of shingles     02/ 2015--  back of neck and left flank--  no residual pain  . History of hypertension   . Recurrent bladder papillary carcinoma first dx 06/ 2014    s/p  turbt's  and instillation mitomycin c (chemo)  . ICH (intracerebral hemorrhage) 02/05/2015   BP 124/64 mmHg  Pulse 80  SpO2   LMP 12/03/2002  Opioid Risk Score:   Fall Risk Score:  `1  Depression screen PHQ 2/9  Depression screen [facility] 2/9 05/20/2015 04/22/2015  Decreased Interest 0 0  Down, Depressed, Hopeless 0 0  PHQ - 2 Score 0 0  Altered sleeping - 1  Tired, decreased energy - 0  Change in appetite - 0  Feeling bad or failure about yourself  - 0  Trouble concentrating - 0  Moving slowly or  fidgety/restless - 0  Suicidal thoughts - 0  PHQ-9 Score - 1     Review of Systems  All other systems reviewed and are negative.      Objective:   Physical Exam  Constitutional: She appears well-developed and well-nourished.  HENT:  Head: Normocephalic and atraumatic.  Eyes: Conjunctivae and EOM are normal. Pupils are equal, round, and reactive to light.  Nursing note and vitals reviewed.   Neuro:  Eyes without evidence of nystagmus  Tone is normal without evidence of spasticity Cerebellar exam shows Mild ataxia on Right sided finger nose finger , And no problem withheel to shin testing. No left-sided deficits + evidence of trunkal ataxia  Motor strength is 5/5 in bilateral deltoid, biceps, triceps, finger flexors and extensors, wrist flexors and extensors, hip flexors, knee flexors and extensors, ankle dorsiflexors, plantar flexors, invertors and evertors, toe flexors and extensors  Sensory exam is  normal to light touch in the upper and lower limbs   Cranial nerves II- Visual fields are intact to confrontation testing, no blurring of vision III- no evidence of ptosis, upward, downward and medial gaze intact IV- no vertical diplopia or head tilt V- no facial numbness or masseter weakness VI- no pupil abduction weakness VII- no facial droop, good lid closure VII- normal auditory acuity IX- no pharygeal weakness, gag nl X- no pharyngeal weakness, no hoarseness XI- no trap or SCM weakness XII- no glossal weakness          Assessment & Plan:  1. Right cerebellar hemorrhage with mild residual right upper extremity ataxia as well as truncal ataxia. She has made excellent improvements. She is back to independent level with all dressing and bathing. Her mobility is now independent without assisted device.  Long discussion with patient and her daughter. We discussed return to driving. My main concern is her vision. She has some visual blurring after the stroke but she states  this has improved. She also has a history of glaucoma as well as status post cataract removal. I would like her to be evaluated by her ophthalmologist prior to return to driving. Gradual return to driving discussed in the event she is cleared by her ophthalmologist  Physical medicine and rehabilitation follow-up on a when necessary basis  Over half of the 25 min visit was spent counseling and coordinating care.

## 2015-05-20 NOTE — Patient Instructions (Signed)
May return to driving after cleared by ophthalmologist                       Graduated return to driving instructions were provided. It is recommended that the patient first drives with another licensed driver in an empty parking lot. If the patient does well with this, and they can drive on a quiet street with the licensed driver. If the patient does well with this they can drive on a busy street with a licensed driver. If the patient does well with this, the next time out they can go by himself. For the first month after resuming driving, I recommend no nighttime or Interstate driving.

## 2015-05-23 ENCOUNTER — Encounter: Payer: Medicare Other | Admitting: Occupational Therapy

## 2015-05-23 ENCOUNTER — Ambulatory Visit: Payer: Medicare Other | Admitting: Physical Therapy

## 2015-05-26 ENCOUNTER — Encounter: Payer: Medicare Other | Admitting: Occupational Therapy

## 2015-05-26 ENCOUNTER — Ambulatory Visit: Payer: Medicare Other | Admitting: Rehabilitative and Restorative Service Providers"

## 2015-05-30 DIAGNOSIS — H4011X4 Primary open-angle glaucoma, indeterminate stage: Secondary | ICD-10-CM | POA: Diagnosis not present

## 2015-05-30 DIAGNOSIS — I639 Cerebral infarction, unspecified: Secondary | ICD-10-CM | POA: Diagnosis not present

## 2015-06-08 ENCOUNTER — Encounter: Payer: Self-pay | Admitting: Obstetrics & Gynecology

## 2015-06-08 ENCOUNTER — Ambulatory Visit (INDEPENDENT_AMBULATORY_CARE_PROVIDER_SITE_OTHER): Payer: Medicare Other | Admitting: Obstetrics & Gynecology

## 2015-06-08 VITALS — BP 104/64 | HR 80 | Resp 20 | Ht 65.75 in | Wt 149.0 lb

## 2015-06-08 DIAGNOSIS — Z01419 Encounter for gynecological examination (general) (routine) without abnormal findings: Secondary | ICD-10-CM

## 2015-06-08 NOTE — Progress Notes (Signed)
80 y.o. [name] MarriedCaucasianF here for annual exam.  Pt had a cerebellar hemorrhage and subsequent stroke in June.  Was hospitalized for 10 days.  She then went to rehab for two weeks.  Then she continued with rehab at home.  This has now finished.  She is using a cane just for support.  (Did use a wheelchair at first).  She is driving again but has just been doing this for about a week.    Denies vaginal bleeding.    Hasn't seen Dr. Tresa Moore, urology, for bladder cancer hx in last six months due to stroke and rehab but does have appt scheduled before the end of the month.  Denies any hematuria.  Also, changes with bowel habits since stroke.  May be due to medication changes.  Had colonoscopy 2005.  Recently saw Dr. Cristina Gong.  Cannot see notes from this visit.  Pt states repeat colonoscopy is not currently planned.   Patient's last menstrual period was 12/03/2002.          Sexually active: No.  The current method of family planning is tubal ligation.    Exercising: No.  not regularly Smoker:  Former smoker-quit in 2004  Health Maintenance: Pap:  01/01/14 WNL History of abnormal Pap:  no MMG:  Per patient 12/15 at [facility] call for results Colonoscopy:  2005-saw Dr Wallis Mart 11/09/14 BMD:   3/2//15 TDaP:  2007 Screening Labs: in hospital, Hb today: in hospital, Urine today: will see urologist in 2 weeks   reports that she quit smoking about 12 years ago. Her smoking use included Cigarettes. She has a 40 pack-year smoking history. She has never used smokeless tobacco. She reports that she does not drink alcohol or use illicit drugs.  Past Medical History  Diagnosis Date  . History of CHF (congestive heart failure)     POST SURG 2010  . COPD (chronic obstructive pulmonary disease) ([place])   . History of breast cancer     DX DCIS IN 2004--  S/P RIGHT MASTECTOMY , NO CHEMORADIATION---  NO RECURRENCE  . Glaucoma of both eyes   . History of atrial fibrillation without current medication 2010   POST SURG 2010--  PER PT NO ISSUES SINCE  . Arthritis   . Dyspnea on exertion   . GERD (gastroesophageal reflux disease)   . Nephrolithiasis   . History of shingles     02/ 2015--  back of neck and left flank--  no residual pain  . History of hypertension   . Recurrent bladder papillary carcinoma ([place]) first dx 06/ 2014    s/p  turbt's  and instillation mitomycin c (chemo)//dx again in 2015-different type  . ICH (intracerebral hemorrhage) ([place]) 02/05/2015    Past Surgical History  Procedure Laterality Date  . Total knee arthroplasty Right 03-22-2009  . Partial mastectomy with needle localization Right 01-14-2003    DCIS  . Total mastectomy Right 02-03-2003    W/ SLN BX  AND  POST 03-01-2003 EVACUATION HEMATOMA  . Removal cyst left hand  2013  . Transthoracic echocardiogram  03-25-2009    MILD LVF/ EF 70-80%/ MILD INCREASE SYSTOLIC PULMONARY  PRESSURE  . Transurethral resection of bladder tumor with gyrus (turbt-gyrus) N/A 03/26/2013    Procedure: TRANSURETHRAL RESECTION OF BLADDER TUMOR WITH GYRUS (TURBT-GYRUS);  Surgeon: Alexis Frock, MD;  Location: [facility];  Service: Urology;  Laterality: N/A;  . Cystoscopy w/ ureteral stent placement Bilateral 03/26/2013    Procedure: CYSTOSCOPY WITH BILATERAL RETROGRADE PYELOGRAM/URETERAL STENT PLACEMENT;  Surgeon: Alexis Frock, MD;  Location: [facility];  Service: Urology;  Laterality: Bilateral;  . Transurethral resection of bladder tumor with gyrus (turbt-gyrus) N/A 05/13/2013    Procedure: TRANSURETHRAL RESECTION OF BLADDER TUMOR WITH GYRUS (TURBT-GYRUS)  RE-STAGING TRANSURETHRAL RESECTION OF BLADDER TUMOR, LEFT RETROGRADE PYELOGRAM AND STENT EXCHANGE;  Surgeon: Alexis Frock, MD;  Location: [facility];  Service: Urology;  Laterality: N/A;  . Cystoscopy w/ ureteral stent placement Left 05/13/2013    Procedure: CYSTOSCOPY WITH RETROGRADE PYELOGRAM/URETERAL STENT PLACEMENT STENT EXCHANGE;   Surgeon: Alexis Frock, MD;  Location: [facility];  Service: Urology;  Laterality: Left;  . Tubal ligation    . Cataract extraction w/ intraocular lens  implant, bilateral    . Transurethral resection of bladder tumor with gyrus (turbt-gyrus) N/A 08/11/2014    Procedure: TRANSURETHRAL RESECTION OF BLADDER TUMOR WITH GYRUS (TURBT-GYRUS);  Surgeon: Alexis Frock, MD;  Location: [facility];  Service: Urology;  Laterality: N/A;  . Cystoscopy w/ retrogrades Bilateral 08/11/2014    Procedure: CYSTOSCOPY WITH BILATERAL RETROGRADE PYELOGRAM AND MITOMYCIN INSTILLATION;  Surgeon: Alexis Frock, MD;  Location: [facility];  Service: Urology;  Laterality: Bilateral;  . Craniectomy N/A 02/05/2015    Procedure: SUBOCCIPITAL CRANIECTOMY EVACUATION OF HEMATOMA;  Surgeon: Consuella Lose, MD;  Location: [place] [facility];  Service: Neurosurgery;  Laterality: N/A;    Current Outpatient Prescriptions  Medication Sig Dispense Refill  . acetaminophen (TYLENOL) 325 MG tablet Take 2 tablets (650 mg total) by mouth every 4 (four) hours as needed for headache or mild pain.    Marland Kitchen atorvastatin (LIPITOR) 10 MG tablet Take 1 tablet (10 mg total) by mouth daily at 6 PM. 30 tablet 11  . digoxin (LANOXIN) 0.125 MG tablet On the first day take 0.50m x 1 then 6 hours later take 0.274magain. Then take 0.12531m1 tablet) daily. 35 tablet 5  . diltiazem (CARDIZEM CD) 240 MG 24 hr capsule Take 1 capsule (240 mg total) by mouth daily. 90 capsule 3  . latanoprost (XALATAN) 0.005 % ophthalmic solution Place 1 drop into both eyes at bedtime.    . levalbuterol (XOPENEX HFA) 45 MCG/ACT inhaler Inhale 1-2 puffs into the lungs every 4 (four) hours as needed for wheezing.    . pantoprazole (PROTONIX) 40 MG tablet Take 1 tablet (40 mg total) by mouth daily. 30 tablet 1  . SIMBRINZA 1-0.2 % SUSP Place 1 drop into both eyes 2 (two) times daily.   99  . metoprolol tartrate (LOPRESSOR) 25 MG tablet Take  25 mg by mouth as needed.  0   No current facility-administered medications for this visit.    Family History  Problem Relation Age of Onset  . Colon cancer Mother 60 68 Colon cancer Maternal Grandmother   . Breast cancer Maternal Aunt 40 54 Cancer Maternal Aunt     ? ovarian  . Breast cancer Other 60    breast cancer, ovarian cancer  . Breast cancer Daughter 47 70 Heart attack Neg Hx   . Stroke Paternal Grandmother   . Stroke Maternal Grandmother   . Hypertension Neg Hx     ROS:  Pertinent items are noted in HPI.  Otherwise, a comprehensive ROS was negative.  Exam:   Ht 5' 5.75" (1.67 m)  Wt 149 lb (67.586 kg)  BMI 24.23 kg/m2  LMP 12/03/2002    Height: 5' 5.75" (167 cm)  Ht Readings from Last 3 Encounters:  06/08/15 5' 5.75" (  1.67 m)  03/10/15 _0  (1.702 m)  03/08/15 _1  (1.702 m)    General appearance: alert, cooperative and appears stated age Head: Normocephalic, without obvious abnormality, atraumatic Neck: no adenopathy, supple, symmetrical, trachea midline and thyroid normal to inspection and palpation Lungs: clear to auscultation bilaterally Breasts: normal appearance, no masses or tenderness Heart: regular rate and rhythm Abdomen: soft, non-tender; bowel sounds normal; no masses,  no organomegaly Extremities: extremities normal, atraumatic, no cyanosis or edema Skin: Skin color, texture, turgor normal. No rashes or lesions Lymph nodes: Cervical, supraclavicular, and axillary nodes normal. No abnormal inguinal nodes palpated Neurologic: Grossly normal   Pelvic: External genitalia:  no lesions              Urethra:  normal appearing urethra with no masses, tenderness or lesions              Bartholins and Skenes: normal                 Vagina: normal appearing vagina with normal color and discharge, no lesions              Cervix: no lesions              Pap taken: No. Bimanual Exam:  Uterus:  normal size, contour, position, consistency, mobility,  non-tender              Adnexa: normal adnexa and no mass, fullness, tenderness               Rectovaginal: Confirms               Anus:  normal sphincter tone, no lesions  Chaperone was present for exam.  A: Well Woman with normal exam H/O nephrolithiasis Bladder cancer 2014. Seeing Dr. Tammi Klippel. Biopsy 7/14. Treated with bladder chemo. Repeat biopsies neg 9/14. Was having every three month cystoscopy until cerebellar stroke. Pt hasn't had follow-up in several months but has appt in two weeks. H/O CHF COPD Glaucoma H/O DCIS s/p right mastectomy 4/04. (Daughter with ductal Br Ca. Neg genetic testing in daughter.) Hypertension H/O cerebellar hemorrhage with hematoma evacuation and associated stroke 6/16 H/O afib with associated RVR, currently sinus rhythm  P: Mammogram yearly.  Results have been called for today. Pap 2015.  No pap today. Vaccine UTD. return annually or prn

## 2015-06-19 NOTE — Progress Notes (Signed)
HPI: FU atrial fibrillation. Echocardiogram June 2016 showed vigorous LV function, mild left ventricular hypertrophy, grade 1 diastolic dysfunction and mild right atrial enlargement. Patient did have postoperative atrial fibrillation in July 2010. Patient was admitted in June 2016 with a large right cerebellar hemorrhage. Note her only anticoagulate at that time was aspirin. The hemorrhage required surgical evacuation. Patient developed atrial fibrillation during the hospitalization with difficult to control heart rate. She was felt not to be a candidate for anticoagulation given intracranial hemorrhage. Patient has had some difficulty with borderline blood pressures and heart rate at home. Holter 7/16 showed atrial fibrillation with elevated rate; digoxin added. Since last seen, She denies dyspnea, chest pain, palpitations, syncope. Mild pedal edema.  Current Outpatient Prescriptions  Medication Sig Dispense Refill  . acetaminophen (TYLENOL) 325 MG tablet Take 2 tablets (650 mg total) by mouth every 4 (four) hours as needed for headache or mild pain.    Marland Kitchen atorvastatin (LIPITOR) 10 MG tablet Take 1 tablet (10 mg total) by mouth daily at 6 PM. 30 tablet 11  . digoxin (LANOXIN) 0.125 MG tablet On the first day take 0.67m x 1 then 6 hours later take 0.247magain. Then take 0.12566m1 tablet) daily. 35 tablet 5  . diltiazem (CARDIZEM CD) 240 MG 24 hr capsule Take 1 capsule (240 mg total) by mouth daily. 90 capsule 3  . latanoprost (XALATAN) 0.005 % ophthalmic solution Place 1 drop into both eyes at bedtime.    . levalbuterol (XOPENEX HFA) 45 MCG/ACT inhaler Inhale 1-2 puffs into the lungs every 4 (four) hours as needed for wheezing.    . metoprolol tartrate (LOPRESSOR) 25 MG tablet Take 25 mg by mouth as needed.  0  . pantoprazole (PROTONIX) 40 MG tablet Take 1 tablet (40 mg total) by mouth daily. 30 tablet 1  . SIMBRINZA 1-0.2 % SUSP Place 1 drop into both eyes 2 (two) times daily.   99   No  current facility-administered medications for this visit.     Past Medical History  Diagnosis Date  . History of CHF (congestive heart failure)     POST SURG 2010  . COPD (ch[place] obstructive [place] disease) [place] . History of breast cancer     DX DCIS IN 2004--  S/P RIGHT MASTECTOMY , NO CHEMORADIATION---  NO RECURRENCE  . Glaucoma of both eyes   . History of atrial fibrillation without current medication 2010    POST SURG 2010--  PER PT NO ISSUES SINCE  . Arthritis   . Dyspnea on exertion   . GERD (gastroesophageal reflux disease)   . Nephrolithiasis   . History of shingles     02/ 2015--  back of neck and left flank--  no residual pain  . History of hypertension   . [place]    s/p  turbt's  and instillation mitomycin c (chemo)//dx again in 2015-different type  . I[place] (intrace[place]bral [place]) [place]/12/2014    Past Surgical History  Procedure Laterality Date  . Total knee arthroplasty Right 03-22-2009  . Partial mastectomy with needle localization Right 01-14-2003    DCIS  . Total mastectomy Right 02-03-2003    W/ SLN BX  AND  POST 03-01-2003 EVACUATION HEMATOMA  . Removal cyst left hand  2013  . Transthoracic echocardiogram  03-25-2009    MILD LVF/ EF 70-80%/ MILD INCREASE SYSTOLIC PULMONARY  PRESSURE  . Transurethral resection of bladder tumor with gyrus (  turbt-gyrus) N/A 03/26/2013    Procedure: TRANSURETHRAL RESECTION OF BLADDER TUMOR WITH GYRUS (TURBT-GYRUS);  Surgeon: Alexis Frock, MD;  Location: [facility];  Service: Urology;  Laterality: N/A;  . Cystoscopy w/ ureteral stent placement Bilateral 03/26/2013    Procedure: CYSTOSCOPY WITH BILATERAL RETROGRADE PYELOGRAM/URETERAL STENT PLACEMENT;  Surgeon: Alexis Frock, MD;  Location: [facility];  Service: Urology;  Laterality: Bilateral;  . Transurethral resection of bladder tumor with gyrus (turbt-gyrus) N/A 05/13/2013     Procedure: TRANSURETHRAL RESECTION OF BLADDER TUMOR WITH GYRUS (TURBT-GYRUS)  RE-STAGING TRANSURETHRAL RESECTION OF BLADDER TUMOR, LEFT RETROGRADE PYELOGRAM AND STENT EXCHANGE;  Surgeon: Alexis Frock, MD;  Location: [facility];  Service: Urology;  Laterality: N/A;  . Cystoscopy w/ ureteral stent placement Left 05/13/2013    Procedure: CYSTOSCOPY WITH RETROGRADE PYELOGRAM/URETERAL STENT PLACEMENT STENT EXCHANGE;  Surgeon: Alexis Frock, MD;  Location: [facility];  Service: Urology;  Laterality: Left;  . Tubal ligation    . Cataract extraction w/ intraocular lens  implant, bilateral    . Transurethral resection of bladder tumor with gyrus (turbt-gyrus) N/A 08/11/2014    Procedure: TRANSURETHRAL RESECTION OF BLADDER TUMOR WITH GYRUS (TURBT-GYRUS);  Surgeon: Alexis Frock, MD;  Location: [facility];  Service: Urology;  Laterality: N/A;  . Cystoscopy w/ retrogrades Bilateral 08/11/2014    Procedure: CYSTOSCOPY WITH BILATERAL RETROGRADE PYELOGRAM AND MITOMYCIN INSTILLATION;  Surgeon: Alexis Frock, MD;  Location: [facility];  Service: Urology;  Laterality: Bilateral;  . Craniectomy N/A 02/05/2015    Procedure: SUBOCCIPITAL CRANIECTOMY EVACUATION OF HEMATOMA;  Surgeon: Consuella Lose, MD;  Location: [place] [facility];  Service: Neurosurgery;  Laterality: N/A;    Social History   Social History  . Marital Status: Married    Spouse Name: N/A  . Number of Children: N/A  . Years of Education: N/A   Occupational History  . Not on file.   Social History Main Topics  . Smoking status: Former Smoker -- 1.00 packs/day for 40 years    Types: Cigarettes    Quit date: 03/20/2003  . Smokeless tobacco: Never Used  . Alcohol Use: No  . Drug Use: No  . Sexual Activity:    Partners: Male    Birth Control/ Protection: Surgical     Comment: BTL   Other Topics Concern  . Not on file   Social History Narrative    ROS: no fevers or chills,  productive cough, hemoptysis, dysphasia, odynophagia, melena, hematochezia, dysuria, hematuria, rash, seizure activity, orthopnea, PND, pedal edema, claudication. Remaining systems are negative.  Physical Exam: Well-developed well-nourished in no acute distress.  Skin is warm and dry.  HEENT is normal.  Neck is supple.  Chest is clear to auscultation with normal expansion.  Cardiovascular exam is irregular Abdominal exam nontender or distended. No masses palpated. Extremities show 1+ edema. neuro grossly intact

## 2015-06-20 ENCOUNTER — Ambulatory Visit (INDEPENDENT_AMBULATORY_CARE_PROVIDER_SITE_OTHER): Payer: Medicare Other | Admitting: Cardiology

## 2015-06-20 ENCOUNTER — Encounter: Payer: Self-pay | Admitting: Cardiology

## 2015-06-20 VITALS — BP 120/84 | HR 80 | Ht 66.0 in | Wt 151.0 lb

## 2015-06-20 DIAGNOSIS — I10 Essential (primary) hypertension: Secondary | ICD-10-CM

## 2015-06-20 DIAGNOSIS — I4891 Unspecified atrial fibrillation: Secondary | ICD-10-CM | POA: Diagnosis not present

## 2015-06-20 DIAGNOSIS — E785 Hyperlipidemia, unspecified: Secondary | ICD-10-CM

## 2015-06-20 DIAGNOSIS — R609 Edema, unspecified: Secondary | ICD-10-CM | POA: Diagnosis not present

## 2015-06-20 DIAGNOSIS — I5042 Chronic combined systolic (congestive) and diastolic (congestive) heart failure: Secondary | ICD-10-CM

## 2015-06-20 MED ORDER — FUROSEMIDE 20 MG PO TABS: 20.0000 mg | ORAL_TABLET | Freq: Every day | ORAL | Status: DC | PRN

## 2015-06-20 NOTE — Assessment & Plan Note (Signed)
Patient has 1+ lower extremity edema. I have given a prescription for Lasix 20 mg daily as needed. Check potassium and renal function in 2 weeks.

## 2015-06-20 NOTE — Patient Instructions (Signed)
Medication Instructions:   START FUROSEMIDE 20 MG ONCE DAILY AS NEEDED FOR SWELLING  Labwork:  Your physician recommends that you return for lab work in: 2 TO 3 WEEKS= DO NOT EAT PRIOR TO LAB WORK  Follow-Up:  Your physician wants you to follow-up in: [place] will receive a reminder letter in the mail two months in advance. If you don't receive a letter, please call our office to schedule the follow-up appointment.

## 2015-06-20 NOTE — Assessment & Plan Note (Signed)
Blood pressure controlled. Continue present medications.

## 2015-06-20 NOTE — Assessment & Plan Note (Signed)
Continue statin. Check lipids and liver.

## 2015-06-20 NOTE — Assessment & Plan Note (Signed)
Patient remains in atrial fibrillation on examination. Her rate appears to be controlled. Continue Cardizem and digoxin. She did not tolerate the combination of Cardizem and Toprol as her blood pressure was borderline. She is not a candidate for anticoagulation as she had a significant intracranial hemorrhage on aspirin alone.

## 2015-06-27 DIAGNOSIS — N200 Calculus of kidney: Secondary | ICD-10-CM | POA: Diagnosis not present

## 2015-06-27 DIAGNOSIS — Z8551 Personal history of malignant neoplasm of bladder: Secondary | ICD-10-CM | POA: Diagnosis not present

## 2015-06-30 IMAGING — CT CT HEAD W/O CM
2 series · 14 of 30 positions shown, 18 images · non-contrast
Comparison: 02/05/2015 CT.

CLINICAL DATA: 79-year-old female post evacuation right cerebellar
hematoma. Dizziness with nausea. Speech difficulty with trouble
writing. Subsequent exam.

EXAM:
CT HEAD WITHOUT CONTRAST
TECHNIQUE: Contiguous axial images were obtained from the base of the skull
through the vertex without intravenous contrast.

[Series 201: head w/o, idose (1) · axial · non-contrast · 0.49mm/px · z∈[+1154,+1289]mm · 13 of 33 slices shown, 17 images]
[im 3/33  brain]
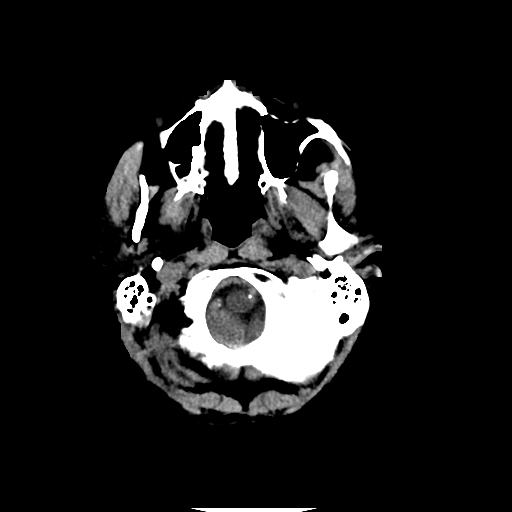
[im 3/33  bone]
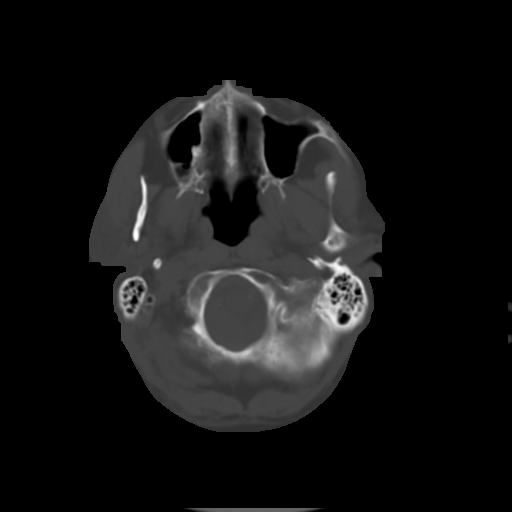
[im 5/33  brain]
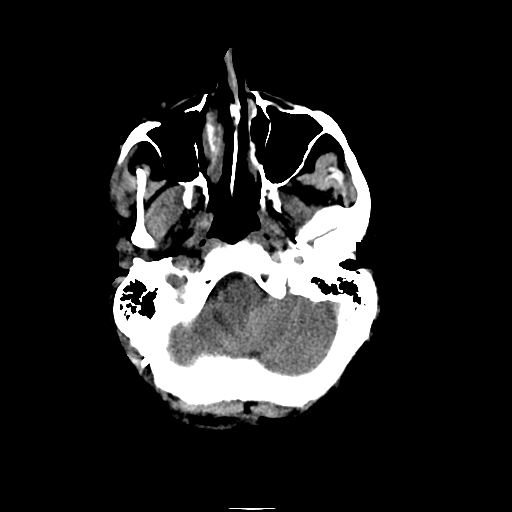
[im 7/33  brain]
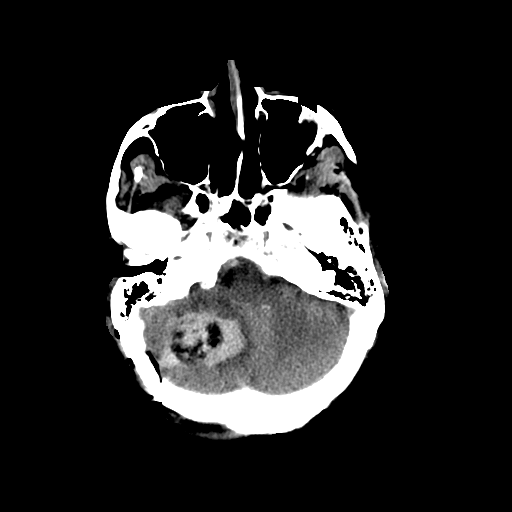
[im 10/33  brain]
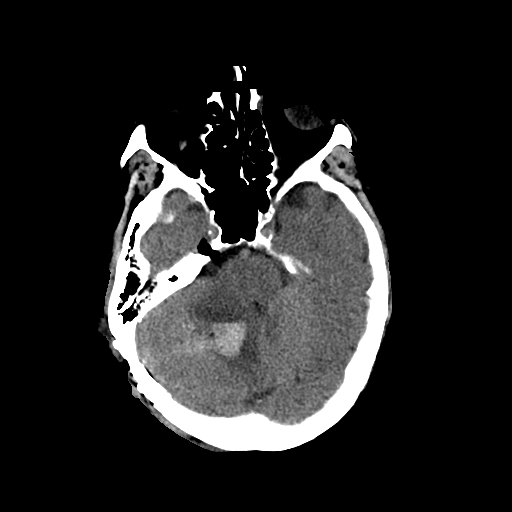
[im 12/33  brain]
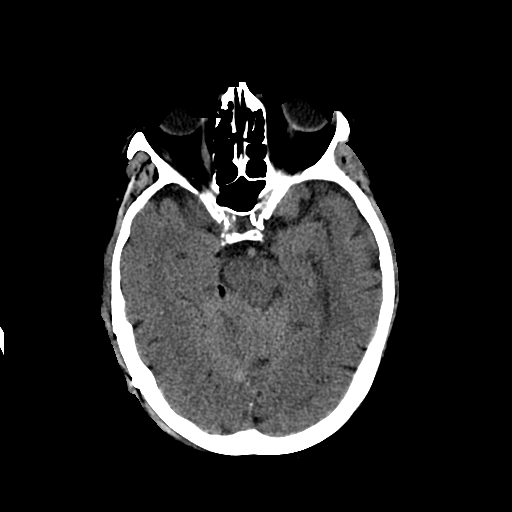
[im 12/33  bone]
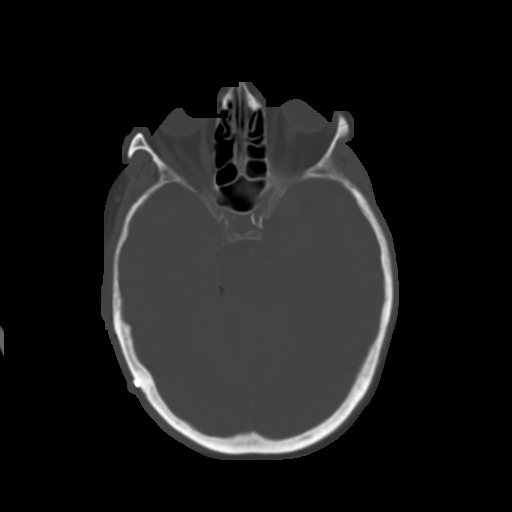
[im 14/33  brain]
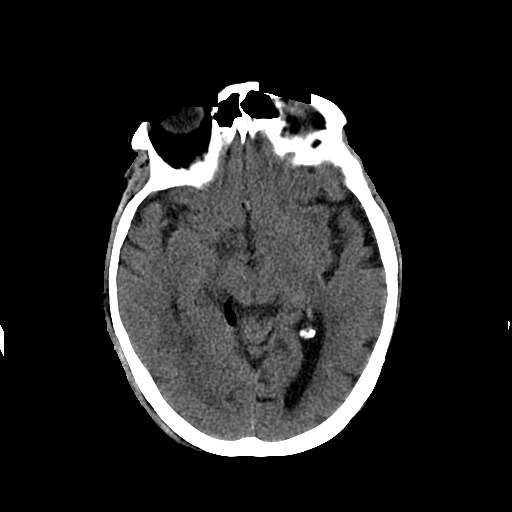
[im 17/33  brain]
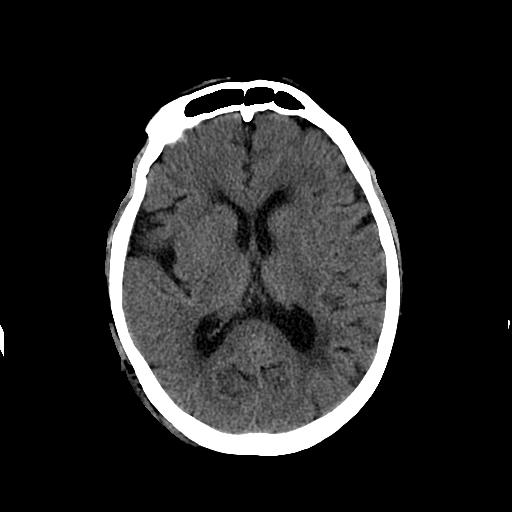
[im 19/33  brain]
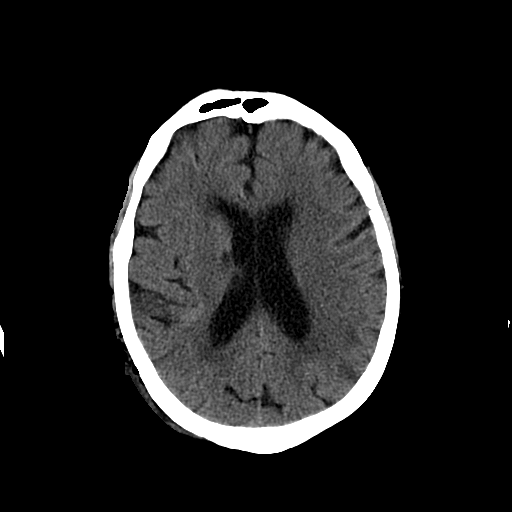
[im 21/33  brain]
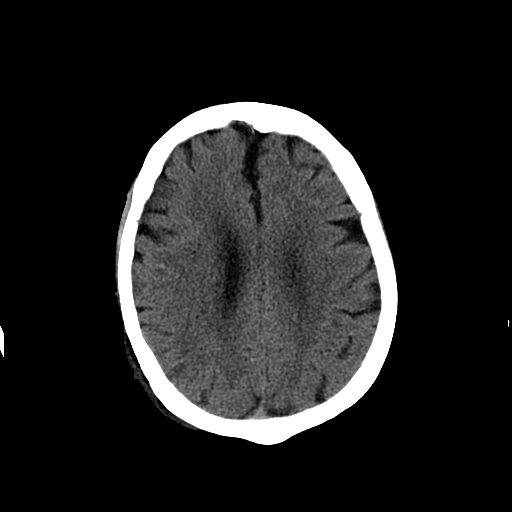
[im 21/33  bone]
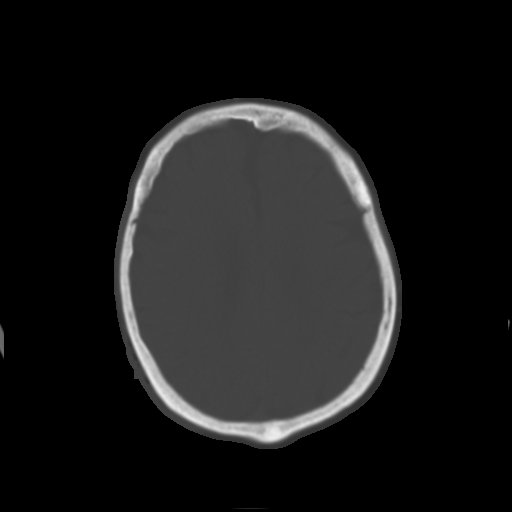
[im 23/33  brain]
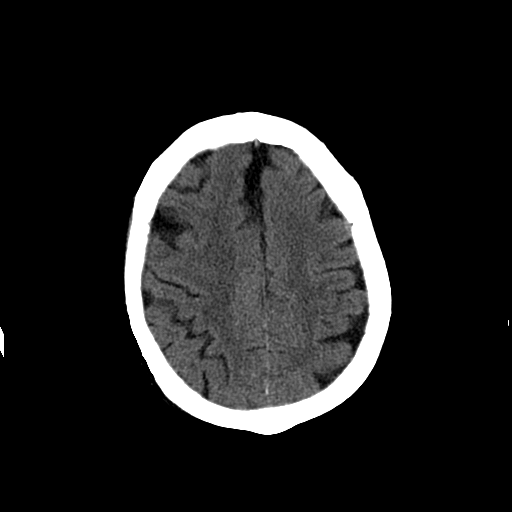
[im 26/33  brain]
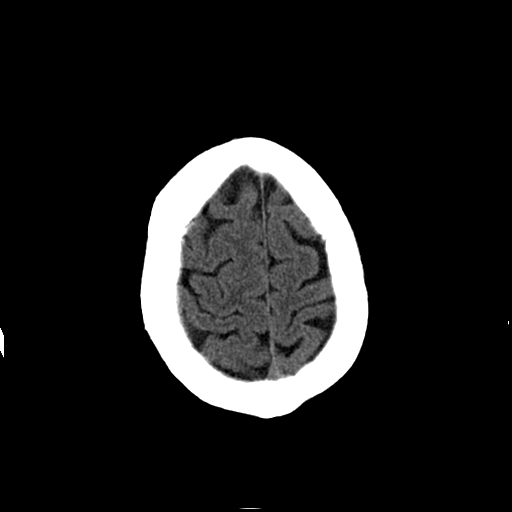
[im 28/33  brain]
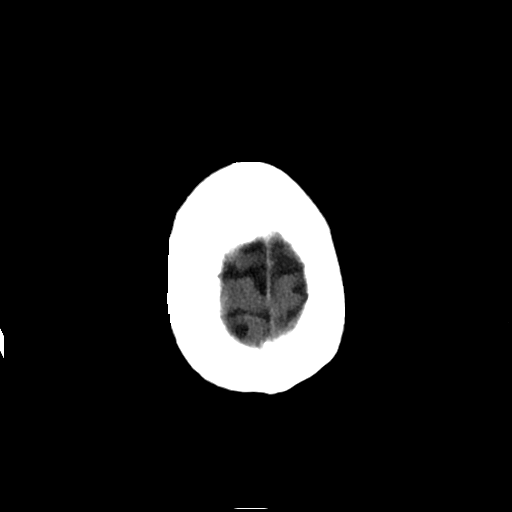
[im 30/33  brain]
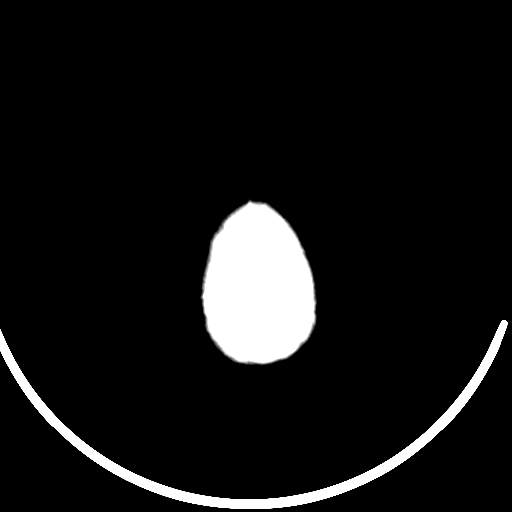
[im 30/33  bone]
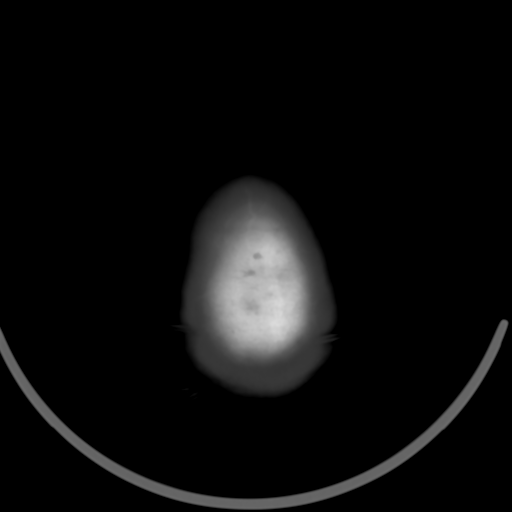

[Series 202: head w/o bone, idose (1) · axial · non-contrast · 0.49mm/px · 1 of 33 slices shown]
[im 3/33  bone]
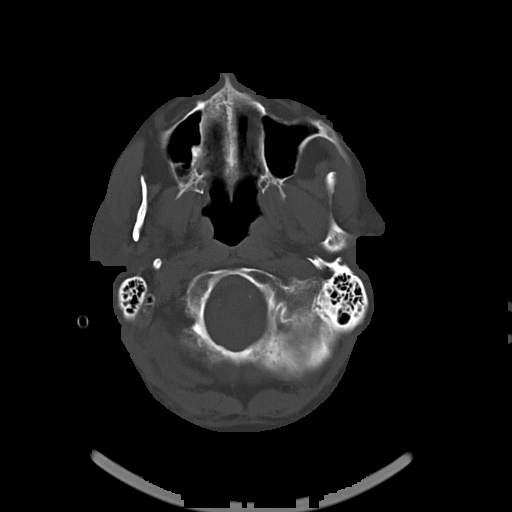

[14 of 30 positions shown; findings below may reference images not displayed]

FINDINGS: Post right occipital craniotomy for drainage of right cerebellar
hematoma. Although there has been debulking of the central aspect of
the hematoma, right cerebellar hematoma remains spanning over 4.1 x
3.5 cm versus prior 4.4 x 3.5 cm.

Surrounding vasogenic edema and mass effect have progressed
slightly. Compression of the fourth ventricle has progressed since
the prior exam. Right cerebellar tonsil extension into the foramen
magnum and upward herniation of the right cerebellum. Compression of
the right aspect of the quadrigeminal plate has progressed slightly.
Ventricular size including asymmetric mild prominence of the left
temporal horn relatively similar to prior exam. Close follow-up
recommended to exclude developing hydrocephalus.

Small amount of pneumocephalus

Remote mid right coronal radiata infarct.

Small vessel disease type changes.

Vascular calcifications.
IMPRESSION: Post right occipital craniotomy for drainage of right cerebellar
hematoma. Although there has been debulking of the central aspect of
the hematoma, right cerebellar hematoma remains spanning over 4.1 x
3.5 cm versus prior 4.4 x 3.5 cm.

Surrounding vasogenic edema and mass effect have progressed
slightly. Compression of the fourth ventricle has progressed since
the prior exam. Right cerebellar tonsil extension into the foramen
magnum and upward herniation of the right cerebellum. Compression of
the right aspect of the quadrigeminal plate has progressed slightly.
Ventricular size including asymmetric mild prominence of the left
temporal horn relatively similar to prior exam. Patient is at risk
for development of hydrocephalus. Close follow-up recommended.

These results will be called to the ordering clinician or
representative by the Radiologist Assistant, and communication
documented in the PACS or zVision Dashboard.

## 2015-07-11 DIAGNOSIS — E785 Hyperlipidemia, unspecified: Secondary | ICD-10-CM | POA: Diagnosis not present

## 2015-07-11 DIAGNOSIS — R609 Edema, unspecified: Secondary | ICD-10-CM | POA: Diagnosis not present

## 2015-07-12 ENCOUNTER — Telehealth: Payer: Self-pay | Admitting: *Deleted

## 2015-07-12 ENCOUNTER — Encounter: Payer: Self-pay | Admitting: Internal Medicine

## 2015-07-12 ENCOUNTER — Ambulatory Visit (INDEPENDENT_AMBULATORY_CARE_PROVIDER_SITE_OTHER): Payer: Medicare Other | Admitting: Internal Medicine

## 2015-07-12 VITALS — BP 98/70 | HR 90 | Temp 98.1°F | Resp 16 | Ht 66.0 in | Wt 148.0 lb

## 2015-07-12 DIAGNOSIS — R748 Abnormal levels of other serum enzymes: Secondary | ICD-10-CM

## 2015-07-12 DIAGNOSIS — I10 Essential (primary) hypertension: Secondary | ICD-10-CM | POA: Diagnosis not present

## 2015-07-12 LAB — LIPID PANEL
Cholesterol: 125 mg/dL (ref 125–200)
HDL: 48 mg/dL (ref >=46)
LDL Cholesterol: 59 mg/dL (ref <130)
TRIGLYCERIDES: 92 mg/dL (ref <150)
Total CHOL/HDL Ratio: 2.6 Ratio (ref <=5.0)
VLDL: 18 mg/dL (ref <30)

## 2015-07-12 LAB — HEPATIC FUNCTION PANEL
ALT: 21 U/L (ref 6–29)
AST: 21 U/L (ref 10–35)
Albumin: 4.1 g/dL (ref 3.6–5.1)
Alkaline Phosphatase: 134 U/L — ABNORMAL HIGH (ref 33–130)
BILIRUBIN INDIRECT: 0.5 mg/dL (ref 0.2–1.2)
BILIRUBIN TOTAL: 0.6 mg/dL (ref 0.2–1.2)
Bilirubin, Direct: 0.1 mg/dL (ref <=0.2)
Total Protein: 6.2 g/dL (ref 6.1–8.1)

## 2015-07-12 LAB — BASIC METABOLIC PANEL
BUN: 19 mg/dL (ref 7–25)
CALCIUM: 9.2 mg/dL (ref 8.6–10.4)
CHLORIDE: 104 mmol/L (ref 98–110)
CO2: 30 mmol/L (ref 20–31)
CREATININE: 0.69 mg/dL (ref 0.60–0.93)
GLUCOSE: 89 mg/dL (ref 65–99)
Potassium: 4.6 mmol/L (ref 3.5–5.3)
Sodium: 142 mmol/L (ref 135–146)

## 2015-07-12 NOTE — Progress Notes (Signed)
Pre visit review using our clinic review tool, if applicable. No additional management support is needed unless otherwise documented below in the visit note.

## 2015-07-12 NOTE — Telephone Encounter (Signed)
-----  Message from [name], MD sent at 07/12/2015  7:29 AM EST ----- Check 5' nucleotidase and GGT. [name]

## 2015-07-12 NOTE — Telephone Encounter (Signed)
Spoke with pt, Aware of dr Jacalyn Lefevre recommendations.  Lab orders mailed to the pt

## 2015-07-12 NOTE — Patient Instructions (Signed)
Edema °Edema is an abnormal buildup of fluids in your body tissues. Edema is somewhat dependent on gravity to pull the fluid to the lowest place in your body. That makes the condition more common in the legs and thighs (lower extremities). Painless swelling of the feet and ankles is common and becomes more likely as you get older. It is also common in looser tissues, like around your eyes.  °When the affected area is squeezed, the fluid may move out of that spot and leave a dent for a few moments. This dent is called pitting.  °CAUSES  °There are many possible causes of edema. Eating too much salt and being on your feet or sitting for a long time can cause edema in your legs and ankles. Hot weather may make edema worse. Common medical causes of edema include: °· Heart failure. °· Liver disease. °· Kidney disease. °· Weak blood vessels in your legs. °· Cancer. °· An injury. °· Pregnancy. °· Some medications. °· Obesity.  °SYMPTOMS  °Edema is usually painless. Your skin may look swollen or shiny.  °DIAGNOSIS  °Your health care provider may be able to diagnose edema by asking about your medical history and doing a physical exam. You may need to have tests such as X-rays, an electrocardiogram, or blood tests to check for medical conditions that may cause edema.  °TREATMENT  °Edema treatment depends on the cause. If you have heart, liver, or kidney disease, you need the treatment appropriate for these conditions. General treatment may include: °· Elevation of the affected body part above the level of your heart. °· Compression of the affected body part. Pressure from elastic bandages or support stockings squeezes the tissues and forces fluid back into the blood vessels. This keeps fluid from entering the tissues. °· Restriction of fluid and salt intake. °· Use of a water pill (diuretic). These medications are appropriate only for some types of edema. They pull fluid out of your body and make you urinate more often. This  gets rid of fluid and reduces swelling, but diuretics can have side effects. Only use diuretics as directed by your health care provider. °HOME CARE INSTRUCTIONS  °· Keep the affected body part above the level of your heart when you are lying down.   °· Do not sit still or stand for prolonged periods.   °· Do not put anything directly under your knees when lying down. °· Do not wear constricting clothing or garters on your upper legs.   °· Exercise your legs to work the fluid back into your blood vessels. This may help the swelling go down.   °· Wear elastic bandages or support stockings to reduce ankle swelling as directed by your health care provider.   °· Eat a low-salt diet to reduce fluid if your health care provider recommends it.   °· Only take medicines as directed by your health care provider.  °SEEK MEDICAL CARE IF:  °· Your edema is not responding to treatment. °· You have heart, liver, or kidney disease and notice symptoms of edema. °· You have edema in your legs that does not improve after elevating them.   °· You have sudden and unexplained weight gain. °SEEK IMMEDIATE MEDICAL CARE IF:  °· You develop shortness of breath or chest pain.   °· You cannot breathe when you lie down. °· You develop pain, redness, or warmth in the swollen areas.   °· You have heart, liver, or kidney disease and suddenly get edema. °· You have a fever and your symptoms suddenly get worse. °MAKE SURE YOU:  °·   Understand these instructions.  Will watch your condition.  Will get help right away if you are not doing well or get worse.   This information is not intended to replace advice given to you by your health care provider. Make sure you discuss any questions you have with your health care provider.   Document Released: 08/20/2005 Document Revised: 09/10/2014 Document Reviewed: 06/12/2013 Elsevier Interactive Patient Education [employer].

## 2015-07-12 NOTE — Progress Notes (Signed)
Subjective:  Patient ID: [name], female    DOB: 04-09-1935  Age: 80 y.o. MRN: [id number]  CC: Hypertension   HPI [name] presents for a BP check, she continues to improve s/p ICH, she is concerned about a recent lab that showed an elevated alk phos, she offers no complaints today.  Outpatient Prescriptions Prior to Visit  Medication Sig Dispense Refill  . acetaminophen (TYLENOL) 325 MG tablet Take 2 tablets (650 mg total) by mouth every 4 (four) hours as needed for headache or mild pain.    Marland Kitchen atorvastatin (LIPITOR) 10 MG tablet Take 1 tablet (10 mg total) by mouth daily at 6 PM. 30 tablet 11  . digoxin (LANOXIN) 0.125 MG tablet On the first day take 0.88m x 1 then 6 hours later take 0.219magain. Then take 0.12532m1 tablet) daily. 35 tablet 5  . diltiazem (CARDIZEM CD) 240 MG 24 hr capsule Take 1 capsule (240 mg total) by mouth daily. 90 capsule 3  . furosemide (LASIX) 20 MG tablet Take 1 tablet (20 mg total) by mouth daily as needed. 30 tablet 6  . latanoprost (XALATAN) 0.005 % ophthalmic solution Place 1 drop into both eyes at bedtime.    . levalbuterol (XOPENEX HFA) 45 MCG/ACT inhaler Inhale 1-2 puffs into the lungs every 4 (four) hours as needed for wheezing.    . pantoprazole (PROTONIX) 40 MG tablet Take 1 tablet (40 mg total) by mouth daily. 30 tablet 1  . SIMBRINZA 1-0.2 % SUSP Place 1 drop into both eyes 2 (two) times daily.   99  . metoprolol tartrate (LOPRESSOR) 25 MG tablet Take 25 mg by mouth as needed.  0   No facility-administered medications prior to visit.    ROS Review of Systems  Constitutional: Negative.  Negative for fever, chills, diaphoresis, appetite change and fatigue.  HENT: Negative.   Eyes: Negative.   Respiratory: Negative.  Negative for cough, choking, chest tightness and stridor.   Cardiovascular: Negative.  Negative for chest pain, palpitations and leg swelling.  Gastrointestinal: Negative.  Negative for nausea, vomiting,  abdominal pain, diarrhea, constipation and blood in stool.  Endocrine: Negative.   Genitourinary: Negative.   Musculoskeletal: Negative.  Negative for myalgias, back pain, arthralgias and neck pain.  Skin: Negative.  Negative for rash.  Allergic/Immunologic: Negative.   Neurological: Negative.  Negative for dizziness, weakness and light-headedness.  Hematological: Negative.  Negative for adenopathy. Does not bruise/bleed easily.  Psychiatric/Behavioral: Negative.     Objective:  BP 98/70 mmHg  Pulse 106  Temp(Src) 98.1 F (36.7 C) (Oral)  Ht _0  (1.676 m)  Wt 148 lb (67.132 kg)  BMI 23.90 kg/m2  SpO2 92%  LMP 12/03/2002  BP Readings from Last 3 Encounters:  07/12/15 98/70  06/20/15 120/84  06/08/15 104/64    Wt Readings from Last 3 Encounters:  07/12/15 148 lb (67.132 kg)  06/20/15 151 lb (68.493 kg)  06/08/15 149 lb (67.586 kg)    Physical Exam  Constitutional: She is oriented to person, place, and time. No distress.  HENT:  Mouth/Throat: Oropharynx is clear and moist. No oropharyngeal exudate.  Eyes: Conjunctivae are normal. Right eye exhibits no discharge. Left eye exhibits no discharge. No scleral icterus.  Neck: Normal range of motion. Neck supple. No JVD present. No tracheal deviation present. No thyromegaly present.  Cardiovascular: Normal rate, normal heart sounds and intact distal pulses.  An irregularly irregular rhythm present. Exam reveals no gallop and no friction rub.  No murmur heard. Pulmonary/Chest: Effort normal and breath sounds normal. No stridor. No respiratory distress. She has no wheezes. She has no rales. She exhibits no tenderness.  Abdominal: Soft. Normal appearance and bowel sounds are normal. She exhibits no distension and no mass. There is no hepatosplenomegaly, splenomegaly or hepatomegaly. There is no tenderness. There is no rebound, no guarding and no CVA tenderness.  Musculoskeletal: Normal range of motion. She exhibits no edema or  tenderness.  Lymphadenopathy:    She has no cervical adenopathy.  Neurological: She is oriented to person, place, and time.  Skin: Skin is warm and dry. No rash noted. She is not diaphoretic. No erythema. No pallor.  Vitals reviewed.   Lab Results  Component Value Date   WBC 6.8 02/23/2015   HGB 13.8 02/23/2015   HCT 43.1 02/23/2015   PLT 283 02/23/2015   GLUCOSE 89 07/11/2015   CHOL 125 07/11/2015   TRIG 92 07/11/2015   HDL 48 07/11/2015   LDLCALC 59 07/11/2015   ALT 21 07/11/2015   AST 21 07/11/2015   NA 142 07/11/2015   K 4.6 07/11/2015   CL 104 07/11/2015   CREATININE 0.69 07/11/2015   BUN 19 07/11/2015   CO2 30 07/11/2015   TSH 1.15 03/11/2015   INR 1.06 02/05/2015   HGBA1C 6.2* 02/06/2015    No results found.  Assessment & Plan:   Jream was seen today for hypertension.  Diagnoses and all orders for this visit:  Alkaline phosphatase elevation- I will check the isoenzymes to identify what is causing the elevation, the other LFT's were normal so I doubt that this is related to the liver -     Alkaline phosphatase, isoenzymes; Future  Benign essential HTN- her BP is well controlled  I am having [name] maintain her latanoprost, levalbuterol, SIMBRINZA, acetaminophen, pantoprazole, metoprolol tartrate, digoxin, atorvastatin, diltiazem, and furosemide.  No orders of the defined types were placed in this encounter.     Follow-up: Return in about 4 months (around 11/09/2015).  [name], MD

## 2015-07-20 DIAGNOSIS — H401114 Primary open-angle glaucoma, right eye, indeterminate stage: Secondary | ICD-10-CM | POA: Diagnosis not present

## 2015-07-20 DIAGNOSIS — H401124 Primary open-angle glaucoma, left eye, indeterminate stage: Secondary | ICD-10-CM | POA: Diagnosis not present

## 2015-07-20 DIAGNOSIS — R748 Abnormal levels of other serum enzymes: Secondary | ICD-10-CM | POA: Diagnosis not present

## 2015-07-21 LAB — GAMMA GT: GGT: 46 U/L (ref 7–51)

## 2015-07-25 LAB — NUCLEOTIDASE, 5', BLOOD: 5 NUCLEOTIDASE: 7 U/L (ref 0–10)

## 2015-08-11 DIAGNOSIS — Z1231 Encounter for screening mammogram for malignant neoplasm of breast: Secondary | ICD-10-CM | POA: Diagnosis not present

## 2015-08-11 DIAGNOSIS — Z853 Personal history of malignant neoplasm of breast: Secondary | ICD-10-CM | POA: Diagnosis not present

## 2015-08-11 LAB — HM MAMMOGRAPHY

## 2015-08-12 ENCOUNTER — Encounter: Payer: Self-pay | Admitting: Internal Medicine

## 2015-09-12 ENCOUNTER — Ambulatory Visit: Payer: Medicare Other | Admitting: Obstetrics & Gynecology

## 2015-09-30 ENCOUNTER — Other Ambulatory Visit: Payer: Self-pay | Admitting: *Deleted

## 2015-09-30 MED ORDER — DIGOXIN 125 MCG PO TABS: ORAL_TABLET | ORAL | Status: DC

## 2015-10-04 ENCOUNTER — Other Ambulatory Visit: Payer: Self-pay | Admitting: *Deleted

## 2015-10-04 MED ORDER — DIGOXIN 125 MCG PO TABS: ORAL_TABLET | ORAL | Status: DC

## 2015-11-09 ENCOUNTER — Encounter: Payer: Self-pay | Admitting: Internal Medicine

## 2015-11-09 ENCOUNTER — Ambulatory Visit (INDEPENDENT_AMBULATORY_CARE_PROVIDER_SITE_OTHER): Payer: Medicare Other | Admitting: Internal Medicine

## 2015-11-09 ENCOUNTER — Other Ambulatory Visit (INDEPENDENT_AMBULATORY_CARE_PROVIDER_SITE_OTHER): Payer: Medicare Other

## 2015-11-09 VITALS — BP 90/68 | HR 68 | Temp 97.9°F | Resp 16 | Ht 66.0 in | Wt 153.0 lb

## 2015-11-09 DIAGNOSIS — I5042 Chronic combined systolic (congestive) and diastolic (congestive) heart failure: Secondary | ICD-10-CM | POA: Diagnosis not present

## 2015-11-09 DIAGNOSIS — Z23 Encounter for immunization: Secondary | ICD-10-CM | POA: Diagnosis not present

## 2015-11-09 DIAGNOSIS — Z0000 Encounter for general adult medical examination without abnormal findings: Secondary | ICD-10-CM | POA: Diagnosis not present

## 2015-11-09 DIAGNOSIS — I10 Essential (primary) hypertension: Secondary | ICD-10-CM

## 2015-11-09 DIAGNOSIS — I4891 Unspecified atrial fibrillation: Secondary | ICD-10-CM

## 2015-11-09 DIAGNOSIS — R739 Hyperglycemia, unspecified: Secondary | ICD-10-CM

## 2015-11-09 DIAGNOSIS — E2839 Other primary ovarian failure: Secondary | ICD-10-CM

## 2015-11-09 DIAGNOSIS — R7303 Prediabetes: Secondary | ICD-10-CM | POA: Insufficient documentation

## 2015-11-09 DIAGNOSIS — R748 Abnormal levels of other serum enzymes: Secondary | ICD-10-CM

## 2015-11-09 DIAGNOSIS — I509 Heart failure, unspecified: Secondary | ICD-10-CM | POA: Diagnosis not present

## 2015-11-09 LAB — COMPREHENSIVE METABOLIC PANEL
ALBUMIN: 4.6 g/dL (ref 3.5–5.2)
ALK PHOS: 109 U/L (ref 39–117)
ALT: 23 U/L (ref 0–35)
AST: 20 U/L (ref 0–37)
BILIRUBIN TOTAL: 0.7 mg/dL (ref 0.2–1.2)
BUN: 20 mg/dL (ref 6–23)
CHLORIDE: 101 meq/L (ref 96–112)
CO2: 32 mEq/L (ref 19–32)
Calcium: 9.5 mg/dL (ref 8.4–10.5)
Creatinine, Ser: 0.71 mg/dL (ref 0.40–1.20)
GFR: 84.54 mL/min (ref >60.00)
Glucose, Bld: 88 mg/dL (ref 70–99)
POTASSIUM: 4.7 meq/L (ref 3.5–5.1)
SODIUM: 141 meq/L (ref 135–145)
Total Protein: 7 g/dL (ref 6.0–8.3)

## 2015-11-09 LAB — HEMOGLOBIN A1C: Hgb A1c MFr Bld: 6.1 % (ref 4.6–6.5)

## 2015-11-09 LAB — TSH: TSH: 1.78 u[IU]/mL (ref 0.35–4.50)

## 2015-11-09 NOTE — Progress Notes (Signed)
Pre visit review using our clinic review tool, if applicable. No additional management support is needed unless otherwise documented below in the visit note.

## 2015-11-09 NOTE — Patient Instructions (Signed)
Preventive Care for Adults, Female A healthy lifestyle and preventive care can promote health and wellness. Preventive health guidelines for women include the following key practices.  A routine yearly physical is a good way to check with your health care provider about your health and preventive screening. It is a chance to share any concerns and updates on your health and to receive a thorough exam.  Visit your dentist for a routine exam and preventive care every 6 months. Brush your teeth twice a day and floss once a day. Good oral hygiene prevents tooth decay and gum disease.  The frequency of eye exams is based on your age, health, family medical history, use of contact lenses, and other factors. Follow your health care provider's recommendations for frequency of eye exams.  Eat a healthy diet. Foods like vegetables, fruits, whole grains, low-fat dairy products, and lean protein foods contain the nutrients you need without too many calories. Decrease your intake of foods high in solid fats, added sugars, and salt. Eat the right amount of calories for you.Get information about a proper diet from your health care provider, if necessary.  Regular physical exercise is one of the most important things you can do for your health. Most adults should get at least 150 minutes of moderate-intensity exercise (any activity that increases your heart rate and causes you to sweat) each week. In addition, most adults need muscle-strengthening exercises on 2 or more days a week.  Maintain a healthy weight. The body mass index (BMI) is a screening tool to identify possible weight problems. It provides an estimate of body fat based on height and weight. Your health care provider can find your BMI and can help you achieve or maintain a healthy weight.For adults 20 years and older:  A BMI below 18.5 is considered underweight.  A BMI of 18.5 to 24.9 is normal.  A BMI of 25 to 29.9 is considered overweight.  A  BMI of 30 and above is considered obese.  Maintain normal blood lipids and cholesterol levels by exercising and minimizing your intake of saturated fat. Eat a balanced diet with plenty of fruit and vegetables. Blood tests for lipids and cholesterol should begin at age 45 and be repeated every 5 years. If your lipid or cholesterol levels are high, you are over 50, or you are at high risk for heart disease, you may need your cholesterol levels checked more frequently.Ongoing high lipid and cholesterol levels should be treated with medicines if diet and exercise are not working.  If you smoke, find out from your health care provider how to quit. If you do not use tobacco, do not start.  Lung cancer screening is recommended for adults aged 45-80 years who are at high risk for developing lung cancer because of a history of smoking. A yearly low-dose CT scan of the lungs is recommended for people who have at least a 30-pack-year history of smoking and are a current smoker or have quit within the past 15 years. A pack year of smoking is smoking an average of 1 pack of cigarettes a day for 1 year (for example: 1 pack a day for 30 years or 2 packs a day for 15 years). Yearly screening should continue until the smoker has stopped smoking for at least 15 years. Yearly screening should be stopped for people who develop a health problem that would prevent them from having lung cancer treatment.  If you are pregnant, do not drink alcohol. If you are  breastfeeding, be very cautious about drinking alcohol. If you are not pregnant and choose to drink alcohol, do not have more than 1 drink per day. One drink is considered to be 12 ounces (355 mL) of beer, 5 ounces (148 mL) of wine, or 1.5 ounces (44 mL) of liquor.  Avoid use of street drugs. Do not share needles with anyone. Ask for help if you need support or instructions about stopping the use of drugs.  High blood pressure causes heart disease and increases the risk  of stroke. Your blood pressure should be checked at least every 1 to 2 years. Ongoing high blood pressure should be treated with medicines if weight loss and exercise do not work.  If you are 55-79 years old, ask your health care provider if you should take aspirin to prevent strokes.  Diabetes screening is done by taking a blood sample to check your blood glucose level after you have not eaten for a certain period of time (fasting). If you are not overweight and you do not have risk factors for diabetes, you should be screened once every 3 years starting at age 45. If you are overweight or obese and you are 40-70 years of age, you should be screened for diabetes every year as part of your cardiovascular risk assessment.  Breast cancer screening is essential preventive care for women. You should practice "breast self-awareness." This means understanding the normal appearance and feel of your breasts and may include breast self-examination. Any changes detected, no matter how small, should be reported to a health care provider. Women in their 20s and 30s should have a clinical breast exam (CBE) by a health care provider as part of a regular health exam every 1 to 3 years. After age 40, women should have a CBE every year. Starting at age 40, women should consider having a mammogram (breast X-ray test) every year. Women who have a family history of breast cancer should talk to their health care provider about genetic screening. Women at a high risk of breast cancer should talk to their health care providers about having an MRI and a mammogram every year.  Breast cancer gene (BRCA)-related cancer risk assessment is recommended for women who have family members with BRCA-related cancers. BRCA-related cancers include breast, ovarian, tubal, and peritoneal cancers. Having family members with these cancers may be associated with an increased risk for harmful changes (mutations) in the breast cancer genes BRCA1 and  BRCA2. Results of the assessment will determine the need for genetic counseling and BRCA1 and BRCA2 testing.  Your health care provider may recommend that you be screened regularly for cancer of the pelvic organs (ovaries, uterus, and vagina). This screening involves a pelvic examination, including checking for microscopic changes to the surface of your cervix (Pap test). You may be encouraged to have this screening done every 3 years, beginning at age 21.  For women ages 30-65, health care providers may recommend pelvic exams and Pap testing every 3 years, or they may recommend the Pap and pelvic exam, combined with testing for human papilloma virus (HPV), every 5 years. Some types of HPV increase your risk of cervical cancer. Testing for HPV may also be done on women of any age with unclear Pap test results.  Other health care providers may not recommend any screening for nonpregnant women who are considered low risk for pelvic cancer and who do not have symptoms. Ask your health care provider if a screening pelvic exam is right for   you.  If you have had past treatment for cervical cancer or a condition that could lead to cancer, you need Pap tests and screening for cancer for at least 20 years after your treatment. If Pap tests have been discontinued, your risk factors (such as having a new sexual partner) need to be reassessed to determine if screening should resume. Some women have medical problems that increase the chance of getting cervical cancer. In these cases, your health care provider may recommend more frequent screening and Pap tests.  Colorectal cancer can be detected and often prevented. Most routine colorectal cancer screening begins at the age of 50 years and continues through age 75 years. However, your health care provider may recommend screening at an earlier age if you have risk factors for colon cancer. On a yearly basis, your health care provider may provide home test kits to check  for hidden blood in the stool. Use of a small camera at the end of a tube, to directly examine the colon (sigmoidoscopy or colonoscopy), can detect the earliest forms of colorectal cancer. Talk to your health care provider about this at age 50, when routine screening begins. Direct exam of the colon should be repeated every 5-10 years through age 75 years, unless early forms of precancerous polyps or small growths are found.  People who are at an increased risk for hepatitis B should be screened for this virus. You are considered at high risk for hepatitis B if:  You were born in a country where hepatitis B occurs often. Talk with your health care provider about which countries are considered high risk.  Your parents were born in a high-risk country and you have not received a shot to protect against hepatitis B (hepatitis B vaccine).  You have HIV or AIDS.  You use needles to inject street drugs.  You live with, or have sex with, someone who has hepatitis B.  You get hemodialysis treatment.  You take certain medicines for conditions like cancer, organ transplantation, and autoimmune conditions.  Hepatitis C blood testing is recommended for all people born from 1945 through 1965 and any individual with known risks for hepatitis C.  Practice safe sex. Use condoms and avoid high-risk sexual practices to reduce the spread of sexually transmitted infections (STIs). STIs include gonorrhea, chlamydia, syphilis, trichomonas, herpes, HPV, and human immunodeficiency virus (HIV). Herpes, HIV, and HPV are viral illnesses that have no cure. They can result in disability, cancer, and death.  You should be screened for sexually transmitted illnesses (STIs) including gonorrhea and chlamydia if:  You are sexually active and are younger than 24 years.  You are older than 24 years and your health care provider tells you that you are at risk for this type of infection.  Your sexual activity has changed  since you were last screened and you are at an increased risk for chlamydia or gonorrhea. Ask your health care provider if you are at risk.  If you are at risk of being infected with HIV, it is recommended that you take a prescription medicine daily to prevent HIV infection. This is called preexposure prophylaxis (PrEP). You are considered at risk if:  You are sexually active and do not regularly use condoms or know the HIV status of your partner(s).  You take drugs by injection.  You are sexually active with a partner who has HIV.  Talk with your health care provider about whether you are at high risk of being infected with HIV. If   you choose to begin PrEP, you should first be tested for HIV. You should then be tested every 3 months for as long as you are taking PrEP.  Osteoporosis is a disease in which the bones lose minerals and strength with aging. This can result in serious bone fractures or breaks. The risk of osteoporosis can be identified using a bone density scan. Women ages 67 years and over and women at risk for fractures or osteoporosis should discuss screening with their health care providers. Ask your health care provider whether you should take a calcium supplement or vitamin D to reduce the rate of osteoporosis.  Menopause can be associated with physical symptoms and risks. Hormone replacement therapy is available to decrease symptoms and risks. You should talk to your health care provider about whether hormone replacement therapy is right for you.  Use sunscreen. Apply sunscreen liberally and repeatedly throughout the day. You should seek shade when your shadow is shorter than you. Protect yourself by wearing long sleeves, pants, a wide-brimmed hat, and sunglasses year round, whenever you are outdoors.  Once a month, do a whole body skin exam, using a mirror to look at the skin on your back. Tell your health care provider of new moles, moles that have irregular borders, moles that  are larger than a pencil eraser, or moles that have changed in shape or color.  Stay current with required vaccines (immunizations).  Influenza vaccine. All adults should be immunized every year.  Tetanus, diphtheria, and acellular pertussis (Td, Tdap) vaccine. Pregnant women should receive 1 dose of Tdap vaccine during each pregnancy. The dose should be obtained regardless of the length of time since the last dose. Immunization is preferred during the 27th-36th week of gestation. An adult who has not previously received Tdap or who does not know her vaccine status should receive 1 dose of Tdap. This initial dose should be followed by tetanus and diphtheria toxoids (Td) booster doses every 10 years. Adults with an unknown or incomplete history of completing a 3-dose immunization series with Td-containing vaccines should begin or complete a primary immunization series including a Tdap dose. Adults should receive a Td booster every 10 years.  Varicella vaccine. An adult without evidence of immunity to varicella should receive 2 doses or a second dose if she has previously received 1 dose. Pregnant females who do not have evidence of immunity should receive the first dose after pregnancy. This first dose should be obtained before leaving the health care facility. The second dose should be obtained 4-8 weeks after the first dose.  Human papillomavirus (HPV) vaccine. Females aged 13-26 years who have not received the vaccine previously should obtain the 3-dose series. The vaccine is not recommended for use in pregnant females. However, pregnancy testing is not needed before receiving a dose. If a female is found to be pregnant after receiving a dose, no treatment is needed. In that case, the remaining doses should be delayed until after the pregnancy. Immunization is recommended for any person with an immunocompromised condition through the age of 61 years if she did not get any or all doses earlier. During the  3-dose series, the second dose should be obtained 4-8 weeks after the first dose. The third dose should be obtained 24 weeks after the first dose and 16 weeks after the second dose.  Zoster vaccine. One dose is recommended for adults aged 30 years or older unless certain conditions are present.  Measles, mumps, and rubella (MMR) vaccine. Adults born  before 1957 generally are considered immune to measles and mumps. Adults born in 1957 or later should have 1 or more doses of MMR vaccine unless there is a contraindication to the vaccine or there is laboratory evidence of immunity to each of the three diseases. A routine second dose of MMR vaccine should be obtained at least 28 days after the first dose for students attending postsecondary schools, health care workers, or international travelers. People who received inactivated measles vaccine or an unknown type of measles vaccine during 1963-1967 should receive 2 doses of MMR vaccine. People who received inactivated mumps vaccine or an unknown type of mumps vaccine before 1979 and are at high risk for mumps infection should consider immunization with 2 doses of MMR vaccine. For females of childbearing age, rubella immunity should be determined. If there is no evidence of immunity, females who are not pregnant should be vaccinated. If there is no evidence of immunity, females who are pregnant should delay immunization until after pregnancy. Unvaccinated health care workers born before 1957 who lack laboratory evidence of measles, mumps, or rubella immunity or laboratory confirmation of disease should consider measles and mumps immunization with 2 doses of MMR vaccine or rubella immunization with 1 dose of MMR vaccine.  Pneumococcal 13-valent conjugate (PCV13) vaccine. When indicated, a person who is uncertain of his immunization history and has no record of immunization should receive the PCV13 vaccine. All adults 65 years of age and older should receive this  vaccine. An adult aged 19 years or older who has certain medical conditions and has not been previously immunized should receive 1 dose of PCV13 vaccine. This PCV13 should be followed with a dose of pneumococcal polysaccharide (PPSV23) vaccine. Adults who are at high risk for pneumococcal disease should obtain the PPSV23 vaccine at least 8 weeks after the dose of PCV13 vaccine. Adults older than 79 years of age who have normal immune system function should obtain the PPSV23 vaccine dose at least 1 year after the dose of PCV13 vaccine.  Pneumococcal polysaccharide (PPSV23) vaccine. When PCV13 is also indicated, PCV13 should be obtained first. All adults aged 65 years and older should be immunized. An adult younger than age 65 years who has certain medical conditions should be immunized. Any person who resides in a nursing home or long-term care facility should be immunized. An adult smoker should be immunized. People with an immunocompromised condition and certain other conditions should receive both PCV13 and PPSV23 vaccines. People with human immunodeficiency virus (HIV) infection should be immunized as soon as possible after diagnosis. Immunization during chemotherapy or radiation therapy should be avoided. Routine use of PPSV23 vaccine is not recommended for American Indians, [place] Natives, or people younger than 65 years unless there are medical conditions that require PPSV23 vaccine. When indicated, people who have unknown immunization and have no record of immunization should receive PPSV23 vaccine. One-time revaccination 5 years after the first dose of PPSV23 is recommended for people aged 19-64 years who have chronic kidney failure, nephrotic syndrome, asplenia, or immunocompromised conditions. People who received 1-2 doses of PPSV23 before age 65 years should receive another dose of PPSV23 vaccine at age 65 years or later if at least 5 years have passed since the previous dose. Doses of PPSV23 are not  needed for people immunized with PPSV23 at or after age 65 years.  Meningococcal vaccine. Adults with asplenia or persistent complement component deficiencies should receive 2 doses of quadrivalent meningococcal conjugate (MenACWY-D) vaccine. The doses should be obtained   at least 2 months apart. Microbiologists working with certain meningococcal bacteria, [place] recruits, people at risk during an outbreak, and people who travel to or live in countries with a high rate of meningitis should be immunized. A first-year college student up through age 34 years who is living in a residence hall should receive a dose if she did not receive a dose on or after her 16th birthday. Adults who have certain high-risk conditions should receive one or more doses of vaccine.  Hepatitis A vaccine. Adults who wish to be protected from this disease, have certain high-risk conditions, work with hepatitis A-infected animals, work in hepatitis A research labs, or travel to or work in countries with a high rate of hepatitis A should be immunized. Adults who were previously unvaccinated and who anticipate close contact with an international adoptee during the first 60 days after arrival in the [place] from a country with a high rate of hepatitis A should be immunized.  Hepatitis B vaccine. Adults who wish to be protected from this disease, have certain high-risk conditions, may be exposed to blood or other infectious body fluids, are household contacts or sex partners of hepatitis B positive people, are clients or workers in certain care facilities, or travel to or work in countries with a high rate of hepatitis B should be immunized.  Haemophilus influenzae type b (Hib) vaccine. A previously unvaccinated person with asplenia or sickle cell disease or having a scheduled splenectomy should receive 1 dose of Hib vaccine. Regardless of previous immunization, a recipient of a hematopoietic stem cell transplant should receive a  3-dose series 6-12 months after her successful transplant. Hib vaccine is not recommended for adults with HIV infection. Preventive Services / Frequency Ages 35 to 4 years  Blood pressure check.** / Every 3-5 years.  Lipid and cholesterol check.** / Every 5 years beginning at age 60.  Clinical breast exam.** / Every 3 years for women in their 71s and 10s.  BRCA-related cancer risk assessment.** / For women who have family members with a BRCA-related cancer (breast, ovarian, tubal, or peritoneal cancers).  Pap test.** / Every 2 years from ages 76 through 26. Every 3 years starting at age 61 through age 76 or 93 with a history of 3 consecutive normal Pap tests.  HPV screening.** / Every 3 years from ages 37 through ages 60 to 51 with a history of 3 consecutive normal Pap tests.  Hepatitis C blood test.** / For any individual with known risks for hepatitis C.  Skin self-exam. / Monthly.  Influenza vaccine. / Every year.  Tetanus, diphtheria, and acellular pertussis (Tdap, Td) vaccine.** / Consult your health care provider. Pregnant women should receive 1 dose of Tdap vaccine during each pregnancy. 1 dose of Td every 10 years.  Varicella vaccine.** / Consult your health care provider. Pregnant females who do not have evidence of immunity should receive the first dose after pregnancy.  HPV vaccine. / 3 doses over 6 months, if 93 and younger. The vaccine is not recommended for use in pregnant females. However, pregnancy testing is not needed before receiving a dose.  Measles, mumps, rubella (MMR) vaccine.** / You need at least 1 dose of MMR if you were born in 1957 or later. You may also need a 2nd dose. For females of childbearing age, rubella immunity should be determined. If there is no evidence of immunity, females who are not pregnant should be vaccinated. If there is no evidence of immunity, females who are  pregnant should delay immunization until after pregnancy.  Pneumococcal  13-valent conjugate (PCV13) vaccine.** / Consult your health care provider.  Pneumococcal polysaccharide (PPSV23) vaccine.** / 1 to 2 doses if you smoke cigarettes or if you have certain conditions.  Meningococcal vaccine.** / 1 dose if you are age 68 to 8 years and a Market researcher living in a residence hall, or have one of several medical conditions, you need to get vaccinated against meningococcal disease. You may also need additional booster doses.  Hepatitis A vaccine.** / Consult your health care provider.  Hepatitis B vaccine.** / Consult your health care provider.  Haemophilus influenzae type b (Hib) vaccine.** / Consult your health care provider. Ages 7 to 53 years  Blood pressure check.** / Every year.  Lipid and cholesterol check.** / Every 5 years beginning at age 25 years.  Lung cancer screening. / Every year if you are aged 11-80 years and have a 30-pack-year history of smoking and currently smoke or have quit within the past 15 years. Yearly screening is stopped once you have quit smoking for at least 15 years or develop a health problem that would prevent you from having lung cancer treatment.  Clinical breast exam.** / Every year after age 48 years.  BRCA-related cancer risk assessment.** / For women who have family members with a BRCA-related cancer (breast, ovarian, tubal, or peritoneal cancers).  Mammogram.** / Every year beginning at age 41 years and continuing for as long as you are in good health. Consult with your health care provider.  Pap test.** / Every 3 years starting at age 65 years through age 37 or 70 years with a history of 3 consecutive normal Pap tests.  HPV screening.** / Every 3 years from ages 72 years through ages 60 to 40 years with a history of 3 consecutive normal Pap tests.  Fecal occult blood test (FOBT) of stool. / Every year beginning at age 21 years and continuing until age 5 years. You may not need to do this test if you get  a colonoscopy every 10 years.  Flexible sigmoidoscopy or colonoscopy.** / Every 5 years for a flexible sigmoidoscopy or every 10 years for a colonoscopy beginning at age 35 years and continuing until age 48 years.  Hepatitis C blood test.** / For all people born from [birth year] through 1965 and any individual with known risks for hepatitis C.  Skin self-exam. / Monthly.  Influenza vaccine. / Every year.  Tetanus, diphtheria, and acellular pertussis (Tdap/Td) vaccine.** / Consult your health care provider. Pregnant women should receive 1 dose of Tdap vaccine during each pregnancy. 1 dose of Td every 10 years.  Varicella vaccine.** / Consult your health care provider. Pregnant females who do not have evidence of immunity should receive the first dose after pregnancy.  Zoster vaccine.** / 1 dose for adults aged 30 years or older.  Measles, mumps, rubella (MMR) vaccine.** / You need at least 1 dose of MMR if you were born in 1957 or later. You may also need a second dose. For females of childbearing age, rubella immunity should be determined. If there is no evidence of immunity, females who are not pregnant should be vaccinated. If there is no evidence of immunity, females who are pregnant should delay immunization until after pregnancy.  Pneumococcal 13-valent conjugate (PCV13) vaccine.** / Consult your health care provider.  Pneumococcal polysaccharide (PPSV23) vaccine.** / 1 to 2 doses if you smoke cigarettes or if you have certain conditions.  Meningococcal vaccine.** /  Consult your health care provider.  Hepatitis A vaccine.** / Consult your health care provider.  Hepatitis B vaccine.** / Consult your health care provider.  Haemophilus influenzae type b (Hib) vaccine.** / Consult your health care provider. Ages 64 years and over  Blood pressure check.** / Every year.  Lipid and cholesterol check.** / Every 5 years beginning at age 23 years.  Lung cancer screening. / Every year if you  are aged 16-80 years and have a 30-pack-year history of smoking and currently smoke or have quit within the past 15 years. Yearly screening is stopped once you have quit smoking for at least 15 years or develop a health problem that would prevent you from having lung cancer treatment.  Clinical breast exam.** / Every year after age 74 years.  BRCA-related cancer risk assessment.** / For women who have family members with a BRCA-related cancer (breast, ovarian, tubal, or peritoneal cancers).  Mammogram.** / Every year beginning at age 44 years and continuing for as long as you are in good health. Consult with your health care provider.  Pap test.** / Every 3 years starting at age 58 years through age 22 or 39 years with 3 consecutive normal Pap tests. Testing can be stopped between 65 and 70 years with 3 consecutive normal Pap tests and no abnormal Pap or HPV tests in the past 10 years.  HPV screening.** / Every 3 years from ages 64 years through ages 70 or 61 years with a history of 3 consecutive normal Pap tests. Testing can be stopped between 65 and 70 years with 3 consecutive normal Pap tests and no abnormal Pap or HPV tests in the past 10 years.  Fecal occult blood test (FOBT) of stool. / Every year beginning at age 40 years and continuing until age 27 years. You may not need to do this test if you get a colonoscopy every 10 years.  Flexible sigmoidoscopy or colonoscopy.** / Every 5 years for a flexible sigmoidoscopy or every 10 years for a colonoscopy beginning at age 7 years and continuing until age 32 years.  Hepatitis C blood test.** / For all people born from [birth year] through 1965 and any individual with known risks for hepatitis C.  Osteoporosis screening.** / A one-time screening for women ages 30 years and over and women at risk for fractures or osteoporosis.  Skin self-exam. / Monthly.  Influenza vaccine. / Every year.  Tetanus, diphtheria, and acellular pertussis (Tdap/Td)  vaccine.** / 1 dose of Td every 10 years.  Varicella vaccine.** / Consult your health care provider.  Zoster vaccine.** / 1 dose for adults aged 35 years or older.  Pneumococcal 13-valent conjugate (PCV13) vaccine.** / Consult your health care provider.  Pneumococcal polysaccharide (PPSV23) vaccine.** / 1 dose for all adults aged 46 years and older.  Meningococcal vaccine.** / Consult your health care provider.  Hepatitis A vaccine.** / Consult your health care provider.  Hepatitis B vaccine.** / Consult your health care provider.  Haemophilus influenzae type b (Hib) vaccine.** / Consult your health care provider. ** Family history and personal history of risk and conditions may change your health care provider's recommendations.   This information is not intended to replace advice given to you by your health care provider. Make sure you discuss any questions you have with your health care provider.   Document Released: 10/16/2001 Document Revised: 09/10/2014 Document Reviewed: 01/15/2011 Elsevier Interactive Patient Education [employer].

## 2015-11-09 NOTE — Progress Notes (Signed)
Subjective:  Patient ID: [name], female    DOB: Jan 20, 1935  Age: 81 y.o. MRN: [id number]  CC: Annual Exam; Hypertension; Atrial Fibrillation; and Congestive Heart Failure   HPI [name] presents for an AWV and f/up on HTN, CHF and afib.  Her BP has been low and she has had no edema, DOE, or SOB. She denies palpitations. She thinks it is time to stop taking the diuretic.  She is recovering well from her CVA, she is more active and independent but still uses a cane. She has no new neuro complaints.  Outpatient Prescriptions Prior to Visit  Medication Sig Dispense Refill  . atorvastatin (LIPITOR) 10 MG tablet Take 1 tablet (10 mg total) by mouth daily at 6 PM. 30 tablet 11  . digoxin (LANOXIN) 0.125 MG tablet Then take 0.132m (1 tablet) daily. 30 tablet 6  . diltiazem (CARDIZEM CD) 240 MG 24 hr capsule Take 1 capsule (240 mg total) by mouth daily. 90 capsule 3  . latanoprost (XALATAN) 0.005 % ophthalmic solution Place 1 drop into both eyes at bedtime. Reported on 11/09/2015    . pantoprazole (PROTONIX) 40 MG tablet Take 1 tablet (40 mg total) by mouth daily. 30 tablet 1  . SIMBRINZA 1-0.2 % SUSP Place 1 drop into both eyes 2 (two) times daily.   99  . metoprolol tartrate (LOPRESSOR) 25 MG tablet Take 25 mg by mouth as needed. Reported on 11/09/2015  0  . acetaminophen (TYLENOL) 325 MG tablet Take 2 tablets (650 mg total) by mouth every 4 (four) hours as needed for headache or mild pain.    . furosemide (LASIX) 20 MG tablet Take 1 tablet (20 mg total) by mouth daily as needed. (Patient not taking: Reported on 11/09/2015) 30 tablet 6  . levalbuterol (XOPENEX HFA) 45 MCG/ACT inhaler Inhale 1-2 puffs into the lungs every 4 (four) hours as needed for wheezing. Reported on 11/09/2015     No facility-administered medications prior to visit.    ROS Review of Systems  Constitutional: Negative for activity change, appetite change, fatigue and unexpected weight change.  HENT:  Negative.   Eyes: Negative.   Respiratory: Negative.  Negative for cough, choking, chest tightness, shortness of breath and stridor.   Cardiovascular: Negative.  Negative for chest pain, palpitations and leg swelling.  Gastrointestinal: Negative.  Negative for nausea, vomiting, abdominal pain, diarrhea and constipation.  Endocrine: Negative.   Genitourinary: Negative.  Negative for dysuria, urgency, frequency, hematuria and difficulty urinating.  Musculoskeletal: Positive for gait problem. Negative for myalgias, back pain, joint swelling, arthralgias and neck pain.  Skin: Negative.  Negative for color change and rash.  Allergic/Immunologic: Negative.   Neurological: Positive for weakness. Negative for dizziness, tremors, seizures, facial asymmetry, numbness and headaches.  Hematological: Negative.  Negative for adenopathy. Does not bruise/bleed easily.  Psychiatric/Behavioral: Negative.     Objective:  BP 90/68 mmHg  Pulse 68  Temp(Src) 97.9 F (36.6 C) (Oral)  Resp 16  Ht _0  (1.676 m)  Wt 153 lb (69.4 kg)  BMI 24.71 kg/m2  SpO2 96%  LMP 12/03/2002  BP Readings from Last 3 Encounters:  11/09/15 90/68  07/12/15 98/70  06/20/15 120/84    Wt Readings from Last 3 Encounters:  11/09/15 153 lb (69.4 kg)  07/12/15 148 lb (67.132 kg)  06/20/15 151 lb (68.493 kg)    Physical Exam  Constitutional: She is oriented to person, place, and time. No distress.  HENT:  Mouth/Throat: Oropharynx is clear and  moist. No oropharyngeal exudate.  Eyes: Conjunctivae are normal. Right eye exhibits no discharge. Left eye exhibits no discharge. No scleral icterus.  Neck: Normal range of motion. Neck supple. No JVD present. No tracheal deviation present. No thyromegaly present.  Cardiovascular: Normal rate, regular rhythm, normal heart sounds and intact distal pulses.  Exam reveals no gallop and no friction rub.   No murmur heard. Pulmonary/Chest: Effort normal and breath sounds normal. No  stridor. No respiratory distress. She has no wheezes. She has no rales. She exhibits no tenderness.  Abdominal: Soft. Bowel sounds are normal. She exhibits no distension and no mass. There is no tenderness. There is no rebound and no guarding.  Musculoskeletal: Normal range of motion. She exhibits no edema or tenderness.  Lymphadenopathy:    She has no cervical adenopathy.  Neurological: She is oriented to person, place, and time.  Skin: Skin is warm and dry. No rash noted. She is not diaphoretic. No erythema. No pallor.  Psychiatric: She has a normal mood and affect. Her behavior is normal. Judgment and thought content normal.  Vitals reviewed.   Lab Results  Component Value Date   WBC 6.8 02/23/2015   HGB 13.8 02/23/2015   HCT 43.1 02/23/2015   PLT 283 02/23/2015   GLUCOSE 88 11/09/2015   CHOL 125 07/11/2015   TRIG 92 07/11/2015   HDL 48 07/11/2015   LDLCALC 59 07/11/2015   ALT 23 11/09/2015   AST 20 11/09/2015   NA 141 11/09/2015   K 4.7 11/09/2015   CL 101 11/09/2015   CREATININE 0.71 11/09/2015   BUN 20 11/09/2015   CO2 32 11/09/2015   TSH 1.78 11/09/2015   INR 1.06 02/05/2015   HGBA1C 6.1 11/09/2015    No results found.  Assessment & Plan:   Harry was seen today for annual exam, hypertension, atrial fibrillation and congestive heart failure.  Diagnoses and all orders for this visit:  Atrial fibrillation with RVR ([place])- she has good rate and rhythm control, due to her prior hemorrhagic CVA she is not a candicate for anticoagulation, her dig level is in the normal range with no signs of toxicity -     TSH; Future -     Digoxin level; Future  Benign essential HTN- her BP is over-controlled, will stop the lasix -     Comprehensive metabolic panel; Future  Hyperglycemia- she has prediabetes, no meds are needed, will cont to monitor -     Comprehensive metabolic panel; Future -     Hemoglobin A1c; Future  Need for Tdap vaccination -     Tdap vaccine greater  than or equal to 7yo IM  Estrogen deficiency- she is due for a DEXA scan -     DG Bone Density; Future  Chronic combined systolic and diastolic congestive heart failure ([place])- she has a normal volume staus, her BP is low and she is high risk for syncope due to low BP, will stop the lasix for now -     Digoxin level; Future  Alkaline phosphatase elevation -     Comprehensive metabolic panel; Future  I have discontinued Ms. Tool's levalbuterol, acetaminophen, and furosemide. I am also having her maintain her latanoprost, SIMBRINZA, pantoprazole, metoprolol tartrate, atorvastatin, diltiazem, digoxin, and diltiazem.  Meds ordered this encounter  Medications  . diltiazem (TIAZAC) 240 MG 24 hr capsule    Sig: TK 1 C PO QD    Refill:  1   See AVS for instructions about healthy living and  anticipatory guidance.  Follow-up: Return in about 6 months (around 05/11/2016).  [name], MD

## 2015-11-10 LAB — DIGOXIN LEVEL: Digoxin Level: 1.2 ug/L (ref 0.8–2.0)

## 2015-11-12 DIAGNOSIS — Z0000 Encounter for general adult medical examination without abnormal findings: Secondary | ICD-10-CM | POA: Insufficient documentation

## 2015-11-12 NOTE — Assessment & Plan Note (Signed)
The written screening recommendations is given to patient and attached in the patent instructions or AVS.   The patient is here for annual Medicare wellness examination and management of other chronic and acute problems.   The risk factors are reflected in the social history.  The roster of all physicians providing medical care to patient - is listed in the Snapshot section of the chart.  Activities of daily living:  The patient is 100% inedpendent in all ADLs: dressing, toileting, feeding as well as independent mobility  Home safety : The patient has smoke detectors in the home. They wear seatbelts.No firearms at home ( firearms are present in the home, kept in a safe fashion). There is no violence in the home.   There is no risks for hepatitis, STDs or HIV. There is no   history of blood transfusion. They have no travel history to infectious disease endemic areas of the world.  The patient has (has not) seen their dentist in the last six month. They have (not) seen their eye doctor in the last year. They deny (admit to) any hearing difficulty and have not had audiologic testing in the last year.  They do not  have excessive sun exposure. Discussed the need for sun protection: hats, long sleeves and use of sunscreen if there is significant sun exposure.   Diet: the importance of a healthy diet is discussed. They do have a healthy (unhealthy-high fat/fast food) diet.  The patient has a regular exercise program.  The benefits of regular aerobic exercise were discussed.  Depression screen: there are no signs or vegative symptoms of depression- irritability, change in appetite, anhedonia, sadness/tearfullness.  Cognitive assessment: the patient manages all their financial and personal affairs and is actively engaged. They could relate day,date,year and events; recalled 3/3 objects at 3 minutes; performed clock-face test normally.  The following portions of the patient's history were reviewed  and updated as appropriate: allergies, current medications, past family history, past medical history,  past surgical history, past social history  and problem list.  Vision, hearing, body mass index were assessed and reviewed.   During the course of the visit the patient was educated and counseled about appropriate screening and preventive services including : fall prevention , diabetes screening, nutrition counseling, colorectal cancer screening, and recommended immunizations.

## 2015-11-21 ENCOUNTER — Other Ambulatory Visit: Payer: Self-pay | Admitting: Internal Medicine

## 2015-12-17 NOTE — Progress Notes (Signed)
HPI: FU atrial fibrillation. Echocardiogram June 2016 showed vigorous LV function, mild left ventricular hypertrophy, grade 1 diastolic dysfunction and mild right atrial enlargement. Patient was admitted in June 2016 with a large right cerebellar hemorrhage. Note her only anticoagulate at that time was aspirin. The hemorrhage required surgical evacuation. Patient developed atrial fibrillation during the hospitalization with difficult to control heart rate. She was felt not to be a candidate for anticoagulation given intracranial hemorrhage. Patient has had some difficulty with borderline blood pressures and heart rate at home. Holter 7/16 showed atrial fibrillation with elevated rate; digoxin added. Since last seen, she does have some dyspnea on exertion. No orthopnea, PND, palpitations, syncope, chest pain or bleeding. Occasional mild pedal edema in the evening.  Current Outpatient Prescriptions  Medication Sig Dispense Refill  . atorvastatin (LIPITOR) 10 MG tablet Take 1 tablet (10 mg total) by mouth daily at 6 PM. 30 tablet 11  . digoxin (LANOXIN) 0.125 MG tablet Then take 0.123m (1 tablet) daily. 30 tablet 6  . diltiazem (CARDIZEM CD) 240 MG 24 hr capsule Take 1 capsule (240 mg total) by mouth daily. 90 capsule 3  . latanoprost (XALATAN) 0.005 % ophthalmic solution Place 1 drop into both eyes at bedtime. Reported on 11/09/2015    . metoprolol tartrate (LOPRESSOR) 25 MG tablet Take 25 mg by mouth as needed. Reported on 11/09/2015  0  . pantoprazole (PROTONIX) 40 MG tablet TAKE ONE TABLET EACH DAY 90 tablet 3  . SIMBRINZA 1-0.2 % SUSP Place 1 drop into both eyes 2 (two) times daily.   99   No current facility-administered medications for this visit.     Past Medical History  Diagnosis Date  . History of CHF (congestive heart failure)     POST SURG 2010  . COPD (chronic obstructive pulmonary disease) ([place]   . History of breast cancer     DX DCIS IN 2004--  S/P RIGHT MASTECTOMY , NO  CHEMORADIATION---  NO RECURRENCE  . Glaucoma of both eyes   . History of atrial fibrillation without current medication 2010    POST SURG 2010--  PER PT NO ISSUES SINCE  . Arthritis   . Dyspnea on exertion   . GERD (gastroesophageal reflux disease)   . Nephrolithiasis   . History of shingles     02/ 2015--  back of neck and left flank--  no residual pain  . History of hypertension   . Recurrent bladder papillary carcinoma ([place] dx 06/ 2014    s/p  turbt's  and instillation mitomycin c (chemo)//dx again in 2015-different type  . I[place] (int[place]bral hemorrhage) ([place] 02/05/2015    Past Surgical History  Procedure Laterality Date  . Total knee arthroplasty Right 03-22-2009  . Partial mastectomy with needle localization Right 01-14-2003    DCIS  . Total mastectomy Right 02-03-2003    W/ SLN BX  AND  POST 03-01-2003 EVACUATION HEMATOMA  . Removal cyst left hand  2013  . Transthoracic echocardiogram  03-25-2009    MILD LVF/ EF 70-80%/ MILD INCREASE SYSTOLIC PULMONARY  PRESSURE  . Transurethral resection of bladder tumor with gyrus (turbt-gyrus) N/A 03/26/2013    Procedure: TRANSURETHRAL RESECTION OF BLADDER TUMOR WITH GYRUS (TURBT-GYRUS);  Surgeon: TAlexis Frock MD;  [facility]  Service: Urology;  Laterality: N/A;  . Cystoscopy w/ ureteral stent placement Bilateral 03/26/2013    Procedure: CYSTOSCOPY WITH BILATERAL RETROGRADE PYELOGRAM/URETERAL STENT PLACEMENT;  Surgeon: TAlexis Frock MD;  [facility];  Service: Urology;  Laterality: Bilateral;  . Transurethral resection of bladder tumor with gyrus (turbt-gyrus) N/A 05/13/2013    Procedure: TRANSURETHRAL RESECTION OF BLADDER TUMOR WITH GYRUS (TURBT-GYRUS)  RE-STAGING TRANSURETHRAL RESECTION OF BLADDER TUMOR, LEFT RETROGRADE PYELOGRAM AND STENT EXCHANGE;  Surgeon: Alexis Frock, MD;  Location[facility];  Service: Urology;  Laterality: N/A;  . Cystoscopy w/ ureteral  stent placement Left 05/13/2013    Procedure: CYSTOSCOPY WITH RETROGRADE PYELOGRAM/URETERAL STENT PLACEMENT STENT EXCHANGE;  Surgeon: Alexis Frock, MD;  Location: [facility];  Service: Urology;  Laterality: Left;  . Tubal ligation    . Cataract extraction w/ intraocular lens  implant, bilateral    . Transurethral resection of bladder tumor with gyrus (turbt-gyrus) N/A 08/11/2014    Procedure: TRANSURETHRAL RESECTION OF BLADDER TUMOR WITH GYRUS (TURBT-GYRUS);  Surgeon: Alexis Frock, MD;  Location: [facility];  Service: Urology;  Laterality: N/A;  . Cystoscopy w/ retrogrades Bilateral 08/11/2014    Procedure: CYSTOSCOPY WITH BILATERAL RETROGRADE PYELOGRAM AND MITOMYCIN INSTILLATION;  Surgeon: Alexis Frock, MD;  Location: [facility];  Service: Urology;  Laterality: Bilateral;  . Craniectomy N/A 02/05/2015    Procedure: SUBOCCIPITAL CRANIECTOMY EVACUATION OF HEMATOMA;  Surgeon: Consuella Lose, MD;  Location: [place] [facility];  Service: Neurosurgery;  Laterality: N/A;    Social History   Social History  . Marital Status: Married    Spouse Name: N/A  . Number of Children: N/A  . Years of Education: N/A   Occupational History  . Not on file.   Social History Main Topics  . Smoking status: Former Smoker -- 1.00 packs/day for 40 years    Types: Cigarettes    Quit date: 03/20/2003  . Smokeless tobacco: Never Used  . Alcohol Use: No  . Drug Use: No  . Sexual Activity:    Partners: Male    Birth Control/ Protection: Surgical     Comment: BTL   Other Topics Concern  . Not on file   Social History Narrative    Family History  Problem Relation Age of Onset  . Colon cancer Mother 48  . Colon cancer Maternal Grandmother   . Breast cancer Maternal Aunt 69  . Cancer Maternal Aunt     ? ovarian  . Breast cancer Other 60    breast cancer, ovarian cancer  . Breast cancer Daughter 10  . Heart attack Neg Hx   . Stroke Paternal Grandmother     . Stroke Maternal Grandmother   . Hypertension Neg Hx     ROS: no fevers or chills, productive cough, hemoptysis, dysphasia, odynophagia, melena, hematochezia, dysuria, hematuria, rash, seizure activity, orthopnea, PND, pedal edema, claudication. Remaining systems are negative.  Physical Exam: Well-developed well-nourished in no acute distress.  Skin is warm and dry.  HEENT is normal.  Neck is supple.  Chest is clear to auscultation with normal expansion.  Cardiovascular exam is irregular Abdominal exam nontender or distended. No masses palpated. Extremities show no edema. neuro grossly intact  ECG Atrial fibrillation at a rate of 85. Normal axis. RV conduction delay. Cannot rule out prior septal infarct. Inferior lateral T-wave inversion.

## 2015-12-19 ENCOUNTER — Encounter: Payer: Self-pay | Admitting: Cardiology

## 2015-12-19 ENCOUNTER — Ambulatory Visit (INDEPENDENT_AMBULATORY_CARE_PROVIDER_SITE_OTHER): Payer: Medicare Other | Admitting: Cardiology

## 2015-12-19 ENCOUNTER — Ambulatory Visit: Payer: Medicare Other | Admitting: Cardiology

## 2015-12-19 VITALS — BP 112/62 | HR 85 | Ht 67.0 in | Wt 155.0 lb

## 2015-12-19 DIAGNOSIS — I5042 Chronic combined systolic (congestive) and diastolic (congestive) heart failure: Secondary | ICD-10-CM | POA: Diagnosis not present

## 2015-12-19 DIAGNOSIS — I4891 Unspecified atrial fibrillation: Secondary | ICD-10-CM | POA: Diagnosis not present

## 2015-12-19 DIAGNOSIS — I10 Essential (primary) hypertension: Secondary | ICD-10-CM | POA: Diagnosis not present

## 2015-12-19 DIAGNOSIS — Z0000 Encounter for general adult medical examination without abnormal findings: Secondary | ICD-10-CM | POA: Diagnosis not present

## 2015-12-19 DIAGNOSIS — C670 Malignant neoplasm of trigone of bladder: Secondary | ICD-10-CM | POA: Diagnosis not present

## 2015-12-19 DIAGNOSIS — E785 Hyperlipidemia, unspecified: Secondary | ICD-10-CM | POA: Diagnosis not present

## 2015-12-19 NOTE — Patient Instructions (Signed)
Your physician recommends that you schedule a follow-up appointment in: [place]

## 2015-12-19 NOTE — Assessment & Plan Note (Signed)
Continue statin. 

## 2015-12-19 NOTE — Assessment & Plan Note (Addendum)
Patient remains in permanent atrial fibrillation. Continue Cardizem and digoxin for rate control. CHADSvasc 4. She would benefit from anticoagulation long term to reduce the risk of embolic event. However she had an intracranial hemorrhage after taking only aspirin intermittently and with no preceding trauma. This required surgical evacuation. I would be hesitant to anticoagulate. We could consider a watchman device. However she would require 3 months of anticoagulation prior to the procedure and 3 months after for total of 6 months. I'm not sure she would be a candidate for this. I will ask Dr Kathyrn Sheriff to review to see if she would be a candidate for 6 months of anticoagulation.

## 2015-12-19 NOTE — Assessment & Plan Note (Signed)
Patient appears to be euvolemic on examination.

## 2015-12-19 NOTE — Assessment & Plan Note (Signed)
Blood pressure controlled. Continue present medications.

## 2016-01-31 ENCOUNTER — Telehealth: Payer: Self-pay | Admitting: Cardiology

## 2016-01-31 NOTE — Telephone Encounter (Signed)
-----  Message from Consuella Lose, MD sent at 01/31/2016 12:36 PM EDT ----- Dr. Stanford Breed,  I'm sorry for the error. I still think she should be okay for short-term anticoagulation.  Consuella Lose ----- Message -----    From: [name], MD    Sent: 01/26/2016   4:47 PM      To: Consuella Lose, MD  Dr Kathyrn Sheriff, Thank you for responding to my question concerning Mrs. Bain. She is a patient who had a cerebellar bleed that required evacuation. My question was the possibility of resuming anticoagulation such as apixaban or coumadin short-term (approximately 6 months) to place a Watchman device for her atrial fibrillation to reduce the risk of embolic CVA. Your note stated we could proceed with this but also stated that her previous bleed occurred while on Coumadin. The patient was not on anticoagulation at the time of her bleed including no aspirin. My question would be could she still be on short-term anticoagulation realizing that her bleed was off of Coumadin. Thanks for your time. Kirk Ruths

## 2016-02-08 DIAGNOSIS — H43813 Vitreous degeneration, bilateral: Secondary | ICD-10-CM | POA: Diagnosis not present

## 2016-02-08 DIAGNOSIS — H52203 Unspecified astigmatism, bilateral: Secondary | ICD-10-CM | POA: Diagnosis not present

## 2016-02-08 DIAGNOSIS — H401114 Primary open-angle glaucoma, right eye, indeterminate stage: Secondary | ICD-10-CM | POA: Diagnosis not present

## 2016-02-08 DIAGNOSIS — H0012 Chalazion right lower eyelid: Secondary | ICD-10-CM | POA: Diagnosis not present

## 2016-02-20 NOTE — Progress Notes (Signed)
HPI: FU atrial fibrillation. Echocardiogram June 2016 showed vigorous LV function, mild left ventricular hypertrophy, grade 1 diastolic dysfunction and mild right atrial enlargement. Patient was admitted in June 2016 with a large right cerebellar hemorrhage. Note her only anticoagulate at that time was aspirin. The hemorrhage required surgical evacuation. Patient developed atrial fibrillation during the hospitalization with difficult to control heart rate. She was felt not to be a candidate for anticoagulation given intracranial hemorrhage. Holter 7/16 showed atrial fibrillation with elevated rate; digoxin added. I recently contacted Dr Kathyrn Sheriff Neurosurgery to see if she would be a candidate for short-term anticoagulation which would then qualify her for a watchman device. He felt this could be pursued. Since last seen, She has some dyspnea on exertion but no orthopnea, PND, palpitations, syncope or chest pain. She can occasionally has mild pedal edema towards the end of the day.  Current Outpatient Prescriptions  Medication Sig Dispense Refill  . atorvastatin (LIPITOR) 10 MG tablet Take 1 tablet (10 mg total) by mouth daily at 6 PM. 30 tablet 11  . digoxin (LANOXIN) 0.125 MG tablet Then take 0.133m (1 tablet) daily. 30 tablet 6  . diltiazem (CARDIZEM CD) 240 MG 24 hr capsule Take 1 capsule (240 mg total) by mouth daily. 90 capsule 3  . latanoprost (XALATAN) 0.005 % ophthalmic solution Place 1 drop into both eyes at bedtime. Reported on 11/09/2015    . metoprolol tartrate (LOPRESSOR) 25 MG tablet Take 25 mg by mouth as needed. Reported on 11/09/2015  0  . pantoprazole (PROTONIX) 40 MG tablet TAKE ONE TABLET EACH DAY 90 tablet 3  . SIMBRINZA 1-0.2 % SUSP Place 1 drop into both eyes 2 (two) times daily.   99   No current facility-administered medications for this visit.     Past Medical History  Diagnosis Date  . History of CHF (congestive heart failure)     POST SURG 2010  . COPD (chronic  obstructive pulmonary disease) ([facility]   . History of breast cancer     DX DCIS IN 2004--  S/P RIGHT MASTECTOMY , NO CHEMORADIATION---  NO RECURRENCE  . Glaucoma of both eyes   . History of atrial fibrillation without current medication 2010    POST SURG 2010--  PER PT NO ISSUES SINCE  . Arthritis   . Dyspnea on exertion   . GERD (gastroesophageal reflux disease)   . Nephrolithiasis   . History of shingles     02/ 2015--  back of neck and left flank--  no residual pain  . History of hypertension   . Recurrent bladder papillary carcinoma ([facility] first dx 06/ 2014    s/p  turbt's  and instillation mitomycin c (chemo)//dx again in 2015-different type  . ICH (intracerebral hemorrhage) ([facility] 02/05/2015    Past Surgical History  Procedure Laterality Date  . Total knee arthroplasty Right 03-22-2009  . Partial mastectomy with needle localization Right 01-14-2003    DCIS  . Total mastectomy Right 02-03-2003    W/ SLN BX  AND  POST 03-01-2003 EVACUATION HEMATOMA  . Removal cyst left hand  2013  . Transthoracic echocardiogram  03-25-2009    MILD LVF/ EF 70-80%/ MILD INCREASE SYSTOLIC PULMONARY  PRESSURE  . Transurethral resection of bladder tumor with gyrus (turbt-gyrus) N/A 03/26/2013    Procedure: TRANSURETHRAL RESECTION OF BLADDER TUMOR WITH GYRUS (TURBT-GYRUS);  Surgeon: TAlexis Frock MD;  [facility]  Service: Urology;  Laterality: N/A;  . Cystoscopy w/ ureteral stent  placement Bilateral 03/26/2013    Procedure: CYSTOSCOPY WITH BILATERAL RETROGRADE PYELOGRAM/URETERAL STENT PLACEMENT;  Surgeon: Alexis Frock, MD;  Location: [facility];  Service: Urology;  Laterality: Bilateral;  . Transurethral resection of bladder tumor with gyrus (turbt-gyrus) N/A 05/13/2013    Procedure: TRANSURETHRAL RESECTION OF BLADDER TUMOR WITH GYRUS (TURBT-GYRUS)  RE-STAGING TRANSURETHRAL RESECTION OF BLADDER TUMOR, LEFT RETROGRADE PYELOGRAM AND STENT EXCHANGE;  Surgeon:  Alexis Frock, MD;  Location: [facility];  Service: Urology;  Laterality: N/A;  . Cystoscopy w/ ureteral stent placement Left 05/13/2013    Procedure: CYSTOSCOPY WITH RETROGRADE PYELOGRAM/URETERAL STENT PLACEMENT STENT EXCHANGE;  Surgeon: Alexis Frock, MD;  Location: [facility];  Service: Urology;  Laterality: Left;  . Tubal ligation    . Cataract extraction w/ intraocular lens  implant, bilateral    . Transurethral resection of bladder tumor with gyrus (turbt-gyrus) N/A 08/11/2014    Procedure: TRANSURETHRAL RESECTION OF BLADDER TUMOR WITH GYRUS (TURBT-GYRUS);  Surgeon: Alexis Frock, MD;  Location: [facility];  Service: Urology;  Laterality: N/A;  . Cystoscopy w/ retrogrades Bilateral 08/11/2014    Procedure: CYSTOSCOPY WITH BILATERAL RETROGRADE PYELOGRAM AND MITOMYCIN INSTILLATION;  Surgeon: Alexis Frock, MD;  Location: [facility];  Service: Urology;  Laterality: Bilateral;  . Craniectomy N/A 02/05/2015    Procedure: SUBOCCIPITAL CRANIECTOMY EVACUATION OF HEMATOMA;  Surgeon: Consuella Lose, MD;  Location: [place];  Service: Neurosurgery;  Laterality: N/A;    Social History   Social History  . Marital Status: Married    Spouse Name: N/A  . Number of Children: N/A  . Years of Education: N/A   Occupational History  . Not on file.   Social History Main Topics  . Smoking status: Former Smoker -- 1.00 packs/day for 40 years    Types: Cigarettes    Quit date: 03/20/2003  . Smokeless tobacco: Never Used  . Alcohol Use: No  . Drug Use: No  . Sexual Activity:    Partners: Male    Birth Control/ Protection: Surgical     Comment: BTL   Other Topics Concern  . Not on file   Social History Narrative    Family History  Problem Relation Age of Onset  . Colon cancer Mother 23  . Colon cancer Maternal Grandmother   . Breast cancer Maternal Aunt 5  . Cancer Maternal Aunt     ? ovarian  . Breast cancer Other 60      breast cancer, ovarian cancer  . Breast cancer Daughter 24  . Heart attack Neg Hx   . Stroke Paternal Grandmother   . Stroke Maternal Grandmother   . Hypertension Neg Hx     ROS: no fevers or chills, productive cough, hemoptysis, dysphasia, odynophagia, melena, hematochezia, dysuria, hematuria, rash, seizure activity, orthopnea, PND, pedal edema, claudication. Remaining systems are negative.  Physical Exam: Well-developed well-nourished in no acute distress.  Skin is warm and dry.  HEENT is normal.  Neck is supple.  Chest is clear to auscultation with normal expansion.  Cardiovascular exam is irregular Abdominal exam nontender or distended. No masses palpated. Extremities show no edema. neuro grossly intact

## 2016-02-28 ENCOUNTER — Ambulatory Visit (INDEPENDENT_AMBULATORY_CARE_PROVIDER_SITE_OTHER): Payer: Medicare Other | Admitting: Cardiology

## 2016-02-28 ENCOUNTER — Encounter: Payer: Self-pay | Admitting: Cardiology

## 2016-02-28 VITALS — BP 100/72 | HR 78 | Ht 67.0 in | Wt 155.0 lb

## 2016-02-28 DIAGNOSIS — I10 Essential (primary) hypertension: Secondary | ICD-10-CM

## 2016-02-28 DIAGNOSIS — I481 Persistent atrial fibrillation: Secondary | ICD-10-CM

## 2016-02-28 DIAGNOSIS — I5042 Chronic combined systolic (congestive) and diastolic (congestive) heart failure: Secondary | ICD-10-CM

## 2016-02-28 DIAGNOSIS — E785 Hyperlipidemia, unspecified: Secondary | ICD-10-CM

## 2016-02-28 DIAGNOSIS — I4819 Other persistent atrial fibrillation: Secondary | ICD-10-CM

## 2016-02-28 NOTE — Patient Instructions (Signed)
Your physician wants you to follow-up in: 6 MONTHS WITH DR CRENSHAW You will receive a reminder letter in the mail two months in advance. If you don't receive a letter, please call our office to schedule the follow-up appointment.  

## 2016-02-28 NOTE — Assessment & Plan Note (Addendum)
Continue Cardizem and digoxin for rate control. CHADSvasc 5. However she has not been anticoagulated because of her previous intracranial hemorrhage. I did contact neurosurgery recently and they felt she could be anticoagulated short-term if she decided to pursue a watchman device. I discussed options of continued medical therapy without anticoagulation and watch him and device. We discussed the risks and benefits of each approach. She understands the higher risk of CVA without watchman and but she would prefer to wait because of the risk of anticoagulation needed at the time of the procedure. We will therefore continue rate control with no anticoagulation and no watchman device.

## 2016-02-28 NOTE — Assessment & Plan Note (Signed)
Blood pressure controlled. Continue present medications. 

## 2016-02-28 NOTE — Assessment & Plan Note (Signed)
Continue statin. 

## 2016-02-28 NOTE — Assessment & Plan Note (Signed)
Patient is euvolemic on examination.

## 2016-04-03 ENCOUNTER — Other Ambulatory Visit: Payer: Self-pay | Admitting: Cardiology

## 2016-04-17 ENCOUNTER — Other Ambulatory Visit: Payer: Self-pay | Admitting: Cardiology

## 2016-05-09 ENCOUNTER — Other Ambulatory Visit (INDEPENDENT_AMBULATORY_CARE_PROVIDER_SITE_OTHER): Payer: Medicare Other

## 2016-05-09 ENCOUNTER — Encounter: Payer: Self-pay | Admitting: Internal Medicine

## 2016-05-09 ENCOUNTER — Ambulatory Visit (INDEPENDENT_AMBULATORY_CARE_PROVIDER_SITE_OTHER): Payer: Medicare Other | Admitting: Internal Medicine

## 2016-05-09 ENCOUNTER — Ambulatory Visit: Payer: Medicare Other | Admitting: Internal Medicine

## 2016-05-09 VITALS — BP 94/66 | HR 71 | Temp 98.2°F | Resp 16 | Ht 67.0 in | Wt 156.8 lb

## 2016-05-09 DIAGNOSIS — L301 Dyshidrosis [pompholyx]: Secondary | ICD-10-CM

## 2016-05-09 DIAGNOSIS — I10 Essential (primary) hypertension: Secondary | ICD-10-CM

## 2016-05-09 DIAGNOSIS — K5900 Constipation, unspecified: Secondary | ICD-10-CM

## 2016-05-09 DIAGNOSIS — E785 Hyperlipidemia, unspecified: Secondary | ICD-10-CM

## 2016-05-09 DIAGNOSIS — R739 Hyperglycemia, unspecified: Secondary | ICD-10-CM

## 2016-05-09 DIAGNOSIS — I4891 Unspecified atrial fibrillation: Secondary | ICD-10-CM

## 2016-05-09 LAB — COMPREHENSIVE METABOLIC PANEL
ALBUMIN: 4.4 g/dL (ref 3.5–5.2)
ALT: 21 U/L (ref 0–35)
AST: 19 U/L (ref 0–37)
Alkaline Phosphatase: 106 U/L (ref 39–117)
BUN: 17 mg/dL (ref 6–23)
CALCIUM: 9 mg/dL (ref 8.4–10.5)
CHLORIDE: 103 meq/L (ref 96–112)
CO2: 33 mEq/L — ABNORMAL HIGH (ref 19–32)
CREATININE: 0.75 mg/dL (ref 0.40–1.20)
GFR: 79.26 mL/min (ref >60.00)
Glucose, Bld: 94 mg/dL (ref 70–99)
POTASSIUM: 4.1 meq/L (ref 3.5–5.1)
Sodium: 141 mEq/L (ref 135–145)
Total Bilirubin: 0.8 mg/dL (ref 0.2–1.2)
Total Protein: 7 g/dL (ref 6.0–8.3)

## 2016-05-09 LAB — LIPID PANEL
CHOLESTEROL: 131 mg/dL (ref 0–200)
HDL: 46.8 mg/dL (ref >39.00)
LDL CALC: 48 mg/dL (ref 0–99)
NonHDL: 84.32
TRIGLYCERIDES: 184 mg/dL — AB (ref 0.0–149.0)
Total CHOL/HDL Ratio: 3
VLDL: 36.8 mg/dL (ref 0.0–40.0)

## 2016-05-09 LAB — TSH: TSH: 1.7 u[IU]/mL (ref 0.35–4.50)

## 2016-05-09 LAB — HEMOGLOBIN A1C: Hgb A1c MFr Bld: 6 % (ref 4.6–6.5)

## 2016-05-09 MED ORDER — TRIAMCINOLONE ACETONIDE 0.5 % EX CREA: 1.0000 "application " | TOPICAL_CREAM | Freq: Three times a day (TID) | CUTANEOUS | 1 refills | Status: DC

## 2016-05-09 NOTE — Progress Notes (Signed)
Pre visit review using our clinic review tool, if applicable. No additional management support is needed unless otherwise documented below in the visit note.

## 2016-05-09 NOTE — Progress Notes (Signed)
Subjective:  Patient ID: [name], female    DOB: February 13, 1935  Age: 81 y.o. MRN: [id number]  CC: Hyperlipidemia and Atrial Fibrillation   HPI [name] presents for f/up - she complains of an itchy rash around both ankles it has been present for several months.  She is doing well on her statin cholesterol medicine with no muscle or joint aches.  She's had no recent episodes of palpitations and denies chest pain/shortness of breath/DOE/edema.  Outpatient Medications Prior to Visit  Medication Sig Dispense Refill  . atorvastatin (LIPITOR) 10 MG tablet TAKE ONE TABLET EACH DAY AT 6PM 30 tablet 1  . digoxin (LANOXIN) 0.125 MG tablet Then take 0.124m (1 tablet) daily. 30 tablet 6  . diltiazem (CARDIZEM CD) 240 MG 24 hr capsule TAKE ONE CAPSULE EACH DAY 90 capsule 2  . latanoprost (XALATAN) 0.005 % ophthalmic solution Place 1 drop into both eyes at bedtime. Reported on 11/09/2015    . pantoprazole (PROTONIX) 40 MG tablet TAKE ONE TABLET EACH DAY 90 tablet 3  . SIMBRINZA 1-0.2 % SUSP Place 1 drop into both eyes 2 (two) times daily.   99  . metoprolol tartrate (LOPRESSOR) 25 MG tablet Take 25 mg by mouth as needed. Reported on 11/09/2015  0   No facility-administered medications prior to visit.     ROS Review of Systems  Constitutional: Negative.  Negative for activity change, chills, diaphoresis, fatigue and unexpected weight change.  HENT: Negative.  Negative for trouble swallowing.   Eyes: Negative.  Negative for visual disturbance.  Respiratory: Negative.  Negative for cough, choking, chest tightness, shortness of breath and stridor.   Cardiovascular: Negative.  Negative for chest pain, palpitations and leg swelling.  Gastrointestinal: Positive for constipation. Negative for abdominal pain, diarrhea, nausea and vomiting.  Endocrine: Negative.   Genitourinary: Negative.  Negative for difficulty urinating.  Musculoskeletal: Negative.  Negative for back pain, gait  problem, joint swelling, myalgias and neck pain.  Skin: Positive for rash. Negative for color change, pallor and wound.  Allergic/Immunologic: Negative.   Neurological: Negative.  Negative for dizziness and weakness.  Hematological: Negative for adenopathy. Does not bruise/bleed easily.  Psychiatric/Behavioral: Negative.     Objective:  BP 94/66 (BP Location: Left Arm, Patient Position: Sitting, Cuff Size: Normal)   Pulse 71   Temp 98.2 F (36.8 C) (Oral)   Resp 16   Ht _0  (1.702 m)   Wt 156 lb 12 oz (71.1 kg)   LMP 12/03/2002   SpO2 90%   BMI 24.55 kg/m   BP Readings from Last 3 Encounters:  05/09/16 94/66  02/28/16 100/72  12/19/15 112/62    Wt Readings from Last 3 Encounters:  05/09/16 156 lb 12 oz (71.1 kg)  02/28/16 155 lb (70.3 kg)  12/19/15 155 lb (70.3 kg)    Physical Exam  Constitutional: She is oriented to person, place, and time. No distress.  HENT:  Mouth/Throat: Oropharynx is clear and moist. No oropharyngeal exudate.  Eyes: Conjunctivae are normal. Right eye exhibits no discharge. Left eye exhibits no discharge. No scleral icterus.  Neck: Normal range of motion. Neck supple. No JVD present. No tracheal deviation present. No thyromegaly present.  Cardiovascular: Normal rate and normal heart sounds.  An irregularly irregular rhythm present. Exam reveals no gallop and no friction rub.   No murmur heard. Pulmonary/Chest: Effort normal. No stridor. No respiratory distress. She has no wheezes. She has no rales. She exhibits no tenderness.  Abdominal: Soft. Bowel sounds  are normal. She exhibits no distension. There is no tenderness. There is no rebound and no guarding.  Musculoskeletal: Normal range of motion. She exhibits no edema, tenderness or deformity.  Lymphadenopathy:    She has no cervical adenopathy.  Neurological: She is oriented to person, place, and time.  Skin: Skin is warm and dry. Rash noted. No purpura noted. Rash is papular. Rash is not  macular, not maculopapular, not nodular, not pustular, not vesicular and not urticarial. She is not diaphoretic. No erythema. No pallor.  Around both ankles there are patches of papules with scale.  Vitals reviewed.   Lab Results  Component Value Date   WBC 6.8 02/23/2015   HGB 13.8 02/23/2015   HCT 43.1 02/23/2015   PLT 283 02/23/2015   GLUCOSE 94 05/09/2016   CHOL 131 05/09/2016   TRIG 184.0 (H) 05/09/2016   HDL 46.80 05/09/2016   LDLCALC 48 05/09/2016   ALT 21 05/09/2016   AST 19 05/09/2016   NA 141 05/09/2016   K 4.1 05/09/2016   CL 103 05/09/2016   CREATININE 0.75 05/09/2016   BUN 17 05/09/2016   CO2 33 (H) 05/09/2016   TSH 1.70 05/09/2016   INR 1.06 02/05/2015   HGBA1C 6.0 05/09/2016    No results found.  Assessment & Plan:   [name] was seen today for hyperlipidemia and atrial fibrillation.  Diagnoses and all orders for this visit:  Benign essential HTN - her blood pressure is well-controlled -     Comprehensive metabolic panel; Future  Constipation, unspecified constipation type- she tells me this does not bother her enough to need to be treated, her labs are negative for any evidence of secondary metabolic causes of constipation. -     Comprehensive metabolic panel; Future -     TSH; Future  HLD (hyperlipidemia)- she has achieved her LDL goal and is doing well on the statin. -     Lipid panel; Future -     TSH; Future  Hyperglycemia- her A1c is 6.0%, she is prediabetic, no medications are needed at this time. -     Comprehensive metabolic panel; Future -     Hemoglobin A1c; Future  Eczema, dyshidrotic- I will treat this with a topical steroid -     triamcinolone cream (KENALOG) 0.5 %; Apply 1 application topically 3 (three) times daily.  Atrial fibrillation with RVR ([place])- she has good rate control with the calcium channel blocker and beta blocker, I will continue.   I am having [name] start on triamcinolone cream. I am also having her maintain  her latanoprost, SIMBRINZA, metoprolol tartrate, digoxin, pantoprazole, atorvastatin, and diltiazem.  Meds ordered this encounter  Medications  . triamcinolone cream (KENALOG) 0.5 %    Sig: Apply 1 application topically 3 (three) times daily.    Dispense:  30 g    Refill:  1     Follow-up: Return in about 6 months (around 11/06/2016).  [name], MD

## 2016-05-09 NOTE — Patient Instructions (Addendum)
Atrial Fibrillation Atrial fibrillation is a type of irregular or rapid heartbeat (arrhythmia). In atrial fibrillation, the heart quivers continuously in a chaotic pattern. This occurs when parts of the heart receive disorganized signals that make the heart unable to pump blood normally. This can increase the risk for stroke, heart failure, and other heart-related conditions. There are different types of atrial fibrillation, including:  Paroxysmal atrial fibrillation. This type starts suddenly, and it usually stops on its own shortly after it starts.  Persistent atrial fibrillation. This type often lasts longer than a week. It may stop on its own or with treatment.  Long-lasting persistent atrial fibrillation. This type lasts longer than 12 months.  Permanent atrial fibrillation. This type does not go away. Talk with your health care provider to learn about the type of atrial fibrillation that you have. CAUSES This condition is caused by some heart-related conditions or procedures, including:  A heart attack.  Coronary artery disease.  Heart failure.  Heart valve conditions.  High blood pressure.  Inflammation of the sac that surrounds the heart (pericarditis).  Heart surgery.  Certain heart rhythm disorders, such as Wolf-Parkinson-White syndrome. Other causes include:  Pneumonia.  Obstructive sleep apnea.  Blockage of an artery in the lungs (pulmonary embolism, or PE).  Lung cancer.  Chronic lung disease.  Thyroid problems, especially if the thyroid is overactive (hyperthyroidism).  Caffeine.  Excessive alcohol use or illegal drug use.  Use of some medicines, including certain decongestants and diet pills. Sometimes, the cause cannot be found. RISK FACTORS This condition is more likely to develop in:  People who are older in age.  People who smoke.  People who have diabetes mellitus.  People who are overweight (obese).  Athletes who exercise  vigorously. SYMPTOMS Symptoms of this condition include:  A feeling that your heart is beating rapidly or irregularly.  A feeling of discomfort or pain in your chest.  Shortness of breath.  Sudden light-headedness or weakness.  Getting tired easily during exercise. In some cases, there are no symptoms. DIAGNOSIS Your health care provider may be able to detect atrial fibrillation when taking your pulse. If detected, this condition may be diagnosed with:  An electrocardiogram (ECG).  A Holter monitor test that records your heartbeat patterns over a 24-hour period.  Transthoracic echocardiogram (TTE) to evaluate how blood flows through your heart.  Transesophageal echocardiogram (TEE) to view more detailed images of your heart.  A stress test.  Imaging tests, such as a CT scan or chest X-ray.  Blood tests. TREATMENT The main goals of treatment are to prevent blood clots from forming and to keep your heart beating at a normal rate and rhythm. The type of treatment that you receive depends on many factors, such as your underlying medical conditions and how you feel when you are experiencing atrial fibrillation. This condition may be treated with:  Medicine to slow down the heart rate, bring the heart's rhythm back to normal, or prevent clots from forming.  Electrical cardioversion. This is a procedure that resets your heart's rhythm by delivering a controlled, low-energy shock to the heart through your skin.  Different types of ablation, such as catheter ablation, catheter ablation with pacemaker, or surgical ablation. These procedures destroy the heart tissues that send abnormal signals. When the pacemaker is used, it is placed under your skin to help your heart beat in a regular rhythm. HOME CARE INSTRUCTIONS  Take over-the counter and prescription medicines only as told by your health care provider.  If your health care provider prescribed a blood-thinning medicine  (anticoagulant), take it exactly as told. Taking too much blood-thinning medicine can cause bleeding. If you do not take enough blood-thinning medicine, you will not have the protection that you need against stroke and other problems.  Do not use tobacco products, including cigarettes, chewing tobacco, and e-cigarettes. If you need help quitting, ask your health care provider.  If you have obstructive sleep apnea, manage your condition as told by your health care provider.  Do not drink alcohol.  Do not drink beverages that contain caffeine, such as coffee, soda, and tea.  Maintain a healthy weight. Do not use diet pills unless your health care provider approves. Diet pills may make heart problems worse.  Follow diet instructions as told by your health care provider.  Exercise regularly as told by your health care provider.  Keep all follow-up visits as told by your health care provider. This is important. PREVENTION  Avoid drinking beverages that contain caffeine or alcohol.  Avoid certain medicines, especially medicines that are used for breathing problems.  Avoid certain herbs and herbal medicines, such as those that contain ephedra or ginseng.  Do not use illegal drugs, such as cocaine and amphetamines.  Do not smoke.  Manage your high blood pressure. SEEK MEDICAL CARE IF:  You notice a change in the rate, rhythm, or strength of your heartbeat.  You are taking an anticoagulant and you notice increased bruising.  You tire more easily when you exercise or exert yourself. SEEK IMMEDIATE MEDICAL CARE IF:  You have chest pain, abdominal pain, sweating, or weakness.  You feel nauseous.  You notice blood in your vomit, bowel movement, or urine.  You have shortness of breath.  You suddenly have swollen feet and ankles.  You feel dizzy.  You have sudden weakness or numbness of the face, arm, or leg, especially on one side of the body.  You have trouble speaking,  trouble understanding, or both (aphasia).  Your face or your eyelid droops on one side. These symptoms may represent a serious problem that is an emergency. Do not wait to see if the symptoms will go away. Get medical help right away. Call your local emergency services (911 in the [place] Do not drive yourself to the hospital.   This information is not intended to replace advice given to you by your health care provider. Make sure you discuss any questions you have with your health care provider.   Document Released: 08/20/2005 Document Revised: 05/11/2015 Document Reviewed: 12/15/2014 Elsevier Interactive Patient Education 2016 [employer]. Constipation, Adult Constipation is when a person has fewer than three bowel movements a week, has difficulty having a bowel movement, or has stools that are dry, hard, or larger than normal. As people grow older, constipation is more common. A low-fiber diet, not taking in enough fluids, and taking certain medicines may make constipation worse.  CAUSES   Certain medicines, such as antidepressants, pain medicine, iron supplements, antacids, and water pills.   Certain diseases, such as diabetes, irritable bowel syndrome (IBS), thyroid disease, or depression.   Not drinking enough water.   Not eating enough fiber-rich foods.   Stress or travel.   Lack of physical activity or exercise.   Ignoring the urge to have a bowel movement.   Using laxatives too much.  SIGNS AND SYMPTOMS   Having fewer than three bowel movements a week.   Straining to have a bowel movement.   Having stools that are  hard, dry, or larger than normal.   Feeling full or bloated.   Pain in the lower abdomen.   Not feeling relief after having a bowel movement.  DIAGNOSIS  Your health care provider will take a medical history and perform a physical exam. Further testing may be done for severe constipation. Some tests may include:  A barium enema X-ray to  examine your rectum, colon, and, sometimes, your small intestine.   A sigmoidoscopy to examine your lower colon.   A colonoscopy to examine your entire colon. TREATMENT  Treatment will depend on the severity of your constipation and what is causing it. Some dietary treatments include drinking more fluids and eating more fiber-rich foods. Lifestyle treatments may include regular exercise. If these diet and lifestyle recommendations do not help, your health care provider may recommend taking over-the-counter laxative medicines to help you have bowel movements. Prescription medicines may be prescribed if over-the-counter medicines do not work.  HOME CARE INSTRUCTIONS   Eat foods that have a lot of fiber, such as fruits, vegetables, whole grains, and beans.  Limit foods high in fat and processed sugars, such as french fries, hamburgers, cookies, candies, and soda.   A fiber supplement may be added to your diet if you cannot get enough fiber from foods.   Drink enough fluids to keep your urine clear or pale yellow.   Exercise regularly or as directed by your health care provider.   Go to the restroom when you have the urge to go. Do not hold it.   Only take over-the-counter or prescription medicines as directed by your health care provider. Do not take other medicines for constipation without talking to your health care provider first.  [place] IF:   You have bright red blood in your stool.   Your constipation lasts for more than 4 days or gets worse.   You have abdominal or rectal pain.   You have thin, pencil-like stools.   You have unexplained weight loss. MAKE SURE YOU:   Understand these instructions.  Will watch your condition.  Will get help right away if you are not doing well or get worse.   This information is not intended to replace advice given to you by your health care provider. Make sure you discuss any questions you have with your  health care provider.   Document Released: 05/18/2004 Document Revised: 09/10/2014 Document Reviewed: 06/01/2013 Elsevier Interactive Patient Education [employer].

## 2016-05-10 ENCOUNTER — Encounter: Payer: Self-pay | Admitting: Internal Medicine

## 2016-06-05 ENCOUNTER — Other Ambulatory Visit: Payer: Self-pay | Admitting: Cardiology

## 2016-06-08 ENCOUNTER — Ambulatory Visit (INDEPENDENT_AMBULATORY_CARE_PROVIDER_SITE_OTHER): Payer: Medicare Other

## 2016-06-08 DIAGNOSIS — Z23 Encounter for immunization: Secondary | ICD-10-CM | POA: Diagnosis not present

## 2016-06-18 DIAGNOSIS — N200 Calculus of kidney: Secondary | ICD-10-CM | POA: Diagnosis not present

## 2016-06-18 DIAGNOSIS — C670 Malignant neoplasm of trigone of bladder: Secondary | ICD-10-CM | POA: Diagnosis not present

## 2016-07-03 ENCOUNTER — Other Ambulatory Visit: Payer: Self-pay | Admitting: Cardiology

## 2016-07-03 NOTE — Telephone Encounter (Signed)
Rx(s) sent to pharmacy electronically.

## 2016-08-06 DIAGNOSIS — H401114 Primary open-angle glaucoma, right eye, indeterminate stage: Secondary | ICD-10-CM | POA: Diagnosis not present

## 2016-08-06 DIAGNOSIS — H01001 Unspecified blepharitis right upper eyelid: Secondary | ICD-10-CM | POA: Diagnosis not present

## 2016-08-06 DIAGNOSIS — H401124 Primary open-angle glaucoma, left eye, indeterminate stage: Secondary | ICD-10-CM | POA: Diagnosis not present

## 2016-08-06 DIAGNOSIS — H04123 Dry eye syndrome of bilateral lacrimal glands: Secondary | ICD-10-CM | POA: Diagnosis not present

## 2016-08-15 DIAGNOSIS — Z1231 Encounter for screening mammogram for malignant neoplasm of breast: Secondary | ICD-10-CM | POA: Diagnosis not present

## 2016-08-15 DIAGNOSIS — Z853 Personal history of malignant neoplasm of breast: Secondary | ICD-10-CM | POA: Diagnosis not present

## 2016-08-15 LAB — HM MAMMOGRAPHY

## 2016-08-21 ENCOUNTER — Encounter: Payer: Self-pay | Admitting: Internal Medicine

## 2016-08-21 NOTE — Progress Notes (Signed)
No evidence of malignancy.

## 2016-09-04 ENCOUNTER — Other Ambulatory Visit: Payer: Self-pay | Admitting: Internal Medicine

## 2016-09-04 ENCOUNTER — Telehealth: Payer: Self-pay | Admitting: Emergency Medicine

## 2016-09-04 ENCOUNTER — Telehealth: Payer: Self-pay | Admitting: Internal Medicine

## 2016-09-04 DIAGNOSIS — J441 Chronic obstructive pulmonary disease with (acute) exacerbation: Secondary | ICD-10-CM

## 2016-09-04 MED ORDER — IPRATROPIUM-ALBUTEROL 20-100 MCG/ACT IN AERS: 1.0000 | INHALATION_SPRAY | Freq: Four times a day (QID) | RESPIRATORY_TRACT | 5 refills | Status: DC

## 2016-09-04 MED ORDER — CEFDINIR 300 MG PO CAPS: 300.0000 mg | ORAL_CAPSULE | Freq: Two times a day (BID) | ORAL | 1 refills | Status: AC

## 2016-09-04 MED ORDER — INDACATEROL-GLYCOPYRROLATE 27.5-15.6 MCG IN CAPS: 1.0000 | ORAL_CAPSULE | Freq: Two times a day (BID) | RESPIRATORY_TRACT | 11 refills | Status: DC

## 2016-09-04 NOTE — Telephone Encounter (Signed)
Routing to dr Ronnald Ramp, please advise, thanks

## 2016-09-04 NOTE — Telephone Encounter (Signed)
done

## 2016-09-04 NOTE — Telephone Encounter (Signed)
Yes, first month free to try it out

## 2016-09-04 NOTE — Telephone Encounter (Signed)
Those inhalers are not appropriate for her condition

## 2016-09-04 NOTE — Telephone Encounter (Signed)
Patient states that utibron neohaler is not in stock at her pharmacy.  She is requesting something in place to be sent to [place].

## 2016-09-04 NOTE — Telephone Encounter (Signed)
Pharmacy informed.  Do you have a copay card or a discount card for the inhaler? The cost is $500

## 2016-09-04 NOTE — Telephone Encounter (Signed)
I will need a pharmacy in [place]

## 2016-09-04 NOTE — Telephone Encounter (Signed)
RXs sent.

## 2016-09-04 NOTE — Telephone Encounter (Signed)
Please advise.

## 2016-09-04 NOTE — Telephone Encounter (Signed)
Pt called and she is at [name]. She has a lot of congestion and coughing. She is SOB and wants to know if you can call her in an inhaler and an antibiotic. Please give her a call back and follow up. Thanks.

## 2016-09-04 NOTE — Telephone Encounter (Addendum)
Pharmacy called and stated that they are not able to order the inhaler that was sent in and that advair or symbicort is preferred by pt pharmacy.

## 2016-09-04 NOTE — Telephone Encounter (Signed)
[facility] on [address] (this has been added as an option for pharmacy.   Pt wanted to know if she still needed to continue the tylenol.

## 2016-09-05 NOTE — Telephone Encounter (Signed)
1 month free card sent to pharmacy in [place].

## 2016-09-08 ENCOUNTER — Ambulatory Visit (INDEPENDENT_AMBULATORY_CARE_PROVIDER_SITE_OTHER): Payer: Medicare Other | Admitting: Family Medicine

## 2016-09-08 ENCOUNTER — Encounter: Payer: Self-pay | Admitting: Family Medicine

## 2016-09-08 VITALS — BP 110/62 | HR 63 | Temp 98.3°F | Resp 20 | Ht 67.0 in | Wt 156.2 lb

## 2016-09-08 DIAGNOSIS — J441 Chronic obstructive pulmonary disease with (acute) exacerbation: Secondary | ICD-10-CM

## 2016-09-08 DIAGNOSIS — J069 Acute upper respiratory infection, unspecified: Secondary | ICD-10-CM

## 2016-09-08 MED ORDER — PREDNISONE 20 MG PO TABS: 40.0000 mg | ORAL_TABLET | Freq: Every day | ORAL | 0 refills | Status: AC

## 2016-09-08 NOTE — Patient Instructions (Signed)
  [place] I have seen you today for an acute visit.  1. COPD exacerbation ([place]) 2. URI acute.   - predniSONE (DELTASONE) 20 MG tablet; Take 2 tablets (40 mg total) by mouth daily with breakfast.  Dispense: 6 tablet; Refill: 0  viral infections are self-limited and we treat each symptom depending of severity.  Over the counter medications as decongestants and cold medications usually help, they need to be taken with caution if there is a history of high blood pressure or palpitations. Not  Recommended.   Tylenol also helps with most symptoms (headache, muscle aching, fever,etc) Plenty of fluids. Honey helps with cough. Steam inhalations helps with runny nose, nasal congestion, and may prevent sinus infections. Cough and nasal congestion could last a few days and sometimes weeks. Please follow in not any better in 1-2 weeks or if symptoms get worse.  Prednisone x 3 days.   In general please monitor for signs of worsening symptoms and seek immediate medical attention if any concerning/warning symptom as we discussed.   Follow with PCP in 5-7 days.  Complete antibiotic treatment.    Please be sure you have an appointment already scheduled with your PCP before you leave today.

## 2016-09-08 NOTE — Progress Notes (Signed)
HPI:  ACUTE VISIT Chief Complaint  Patient presents with  . Cough  . Chest congestion  . Nasal Congestion    yellow drainage    [name] is a 82 y.o.female here today with her daughter complaining of 5-6 days of respiratory symptoms.  Symptoms started while she was in [facility], her PCP called in for [place],  3rd day today.  Productive cough with little bit of sputum, she denies hemoptysis.  + Nasal congestion, rhinorrhea, and post nasal drainage.  Denies has not noted chest pain. She has had dyspnea and wheezing, little more than usual.  No Hx of recent overseas travel. No sick contact. No known insect bite.  No Hx of allergies.  Hx of COPD. She is on Combivent Respimat qid as needed. Former smoker.  OTC medications for this problem: None  Symptoms she is feeling better.  Hx of atrial fib.   Review of Systems  Constitutional: Positive for fatigue. Negative for chills, diaphoresis and fever.  HENT: Positive for congestion and postnasal drip. Negative for ear pain, mouth sores, sinus pain, sore throat and trouble swallowing.   Eyes: Negative for discharge and redness.  Respiratory: Positive for cough, shortness of breath and wheezing.   Cardiovascular: Negative for chest pain.  Gastrointestinal: Negative for abdominal pain, diarrhea, nausea and vomiting.  Musculoskeletal: Negative for myalgias and neck pain.  Skin: Negative for rash.  Allergic/Immunologic: Negative for environmental allergies.  Neurological: Negative for syncope, weakness and headaches.  Hematological: Negative for adenopathy. Does not bruise/bleed easily.  Psychiatric/Behavioral: Negative for confusion.      Current Outpatient Prescriptions on File Prior to Visit  Medication Sig Dispense Refill  . atorvastatin (LIPITOR) 10 MG tablet TAKE ONE TABLET EACH DAY AT 6PM 30 tablet 2  . cefdinir (OMNICEF) 300 MG capsule Take 1 capsule (300 mg total) by mouth 2 (two) times  daily. 20 capsule 1  . digoxin (LANOXIN) 0.125 MG tablet Then take 0.155m (1 tablet) daily. 30 tablet 6  . diltiazem (CARDIZEM CD) 240 MG 24 hr capsule TAKE ONE CAPSULE EACH DAY 90 capsule 2  . Indacaterol-Glycopyrrolate (UTIBRON NEOHALER) 27.5-15.6 MCG CAPS Place 1 puff into inhaler and inhale 2 (two) times daily. 60 capsule 11  . Ipratropium-Albuterol (COMBIVENT RESPIMAT) 20-100 MCG/ACT AERS respimat Inhale 1 puff into the lungs every 6 (six) hours. 4 g 5  . latanoprost (XALATAN) 0.005 % ophthalmic solution Place 1 drop into both eyes at bedtime. Reported on 11/09/2015    . pantoprazole (PROTONIX) 40 MG tablet TAKE ONE TABLET EACH DAY 90 tablet 3  . SIMBRINZA 1-0.2 % SUSP Place 1 drop into both eyes 2 (two) times daily.   99  . triamcinolone cream (KENALOG) 0.5 % Apply 1 application topically 3 (three) times daily. 30 g 1  . metoprolol tartrate (LOPRESSOR) 25 MG tablet Take 25 mg by mouth as needed. Reported on 11/09/2015  0   No current facility-administered medications on file prior to visit.      Past Medical History:  Diagnosis Date  . Arthritis   . COPD (chronic obstructive pulmonary disease) ([place]   . Dyspnea on exertion   . GERD (gastroesophageal reflux disease)   . Glaucoma of both eyes   . History of atrial fibrillation without current medication 2010   POST SURG 2010--  PER PT NO ISSUES SINCE  . History of breast cancer    DX DCIS IN 2004--  S/P RIGHT MASTECTOMY , NO CHEMORADIATION---  NO RECURRENCE  .  History of CHF (congestive heart failure)    POST SURG 2010  . History of hypertension   . History of shingles    02/ 2015--  back of neck and left flank--  no residual pain  . ICH (intracerebral hemorrhage) ([place]) 02/05/2015  . Nephrolithiasis   . 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 Morphine And Related Nausea And Vomiting     Social History   Social History  . Marital status: Married    Spouse name: N/A  . Number of children: N/A  . Years of education: N/A   Social History Main Topics  . Smoking status: Former Smoker    Packs/day: 1.00    Years: 40.00    Types: Cigarettes    Quit date: 03/20/2003  . Smokeless tobacco: Never Used  . Alcohol use No  . Drug use: No  . Sexual activity: Yes    Partners: Male    Birth control/ protection: Surgical     Comment: BTL   Other Topics Concern  . None   Social History Narrative  . None    Vitals:   09/08/16 1017  BP: 110/62  Pulse: 63  Resp: 20  Temp: 98.3 F (36.8 C)    O2 sat 99% at RA.   Body mass index is 24.47 kg/m.    Physical Exam  Nursing note and vitals reviewed. Constitutional: She is oriented to person, place, and time. She appears well-developed. She does not appear ill. No distress.  HENT:  Head: Atraumatic.  Right Ear: Tympanic membrane, external ear and ear canal normal.  Left Ear: External ear normal.  Nose: Right sinus exhibits no maxillary sinus tenderness and no frontal sinus tenderness. Left sinus exhibits no maxillary sinus tenderness and no frontal sinus tenderness.  Mouth/Throat: Oropharynx is clear and moist and mucous membranes are normal.  Left cerumen excess, not able to see TM.  Eyes: Conjunctivae are normal.  Cardiovascular: Normal rate.  An irregular rhythm present.  No murmur heard. Respiratory: Effort normal. No stridor. No respiratory distress. She has decreased breath sounds (mild, bilateral). She has no wheezes. She has no rhonchi. She has no rales.  Lymphadenopathy:       Head (right side): No submandibular adenopathy present.       Head (left side): No submandibular adenopathy present.    She has no cervical adenopathy.  Neurological: She is alert and oriented to person, place, and time. She has normal strength.  Skin: Skin is warm. No rash noted. No erythema.  Psychiatric: She has a normal  mood and affect. Her speech is normal.  Well groomed, good eye contact.      ASSESSMENT AND PLAN:     [name] was seen today for cough, chest congestion and nasal congestion.  Diagnoses and all orders for this visit:  COPD exacerbation ([place]) -     predniSONE (DELTASONE) 20 MG tablet; Take 2 tablets (40 mg total) by mouth daily with breakfast.  URI, acute   Reporting improvement. Symptoms could be viral in etiology, in which case abx will not help. Complete abx treatment.  Prednisone added, some side effects discussed. Continue Combivent qid. Instructed to monitor for signs of complications,clearly instructed about warning signs. I also explained that cough and nasal congestion can last a few days and sometimes weeks. F/U in 5-7 days.       -  [name] was advised to return or notify a doctor immediately if symptoms worsen or new concerns arise.       Kadden Osterhout G. [place], MD  [facility]. [place] office.

## 2016-09-08 NOTE — Progress Notes (Signed)
Pre visit review using our clinic review tool, if applicable. No additional management support is needed unless otherwise documented below in the visit note.

## 2016-09-17 ENCOUNTER — Telehealth: Payer: Self-pay | Admitting: Internal Medicine

## 2016-09-17 ENCOUNTER — Other Ambulatory Visit: Payer: Self-pay | Admitting: Internal Medicine

## 2016-09-17 MED ORDER — FLUCONAZOLE 150 MG PO TABS: 150.0000 mg | ORAL_TABLET | Freq: Once | ORAL | 3 refills | Status: AC

## 2016-09-17 NOTE — Telephone Encounter (Signed)
Pt called in said that she has now gotten an yeast infection and would like for Dr Waynard Reeds to call in something for her   Pharmacy - [facility]

## 2016-09-17 NOTE — Telephone Encounter (Signed)
RX sent

## 2016-09-17 NOTE — Telephone Encounter (Signed)
Pt rq rx for yeast infection

## 2016-09-17 NOTE — Telephone Encounter (Signed)
Patient called to follow up

## 2016-09-25 ENCOUNTER — Other Ambulatory Visit: Payer: Self-pay | Admitting: Cardiology

## 2016-09-25 NOTE — Telephone Encounter (Signed)
REFILL

## 2016-09-27 ENCOUNTER — Encounter: Payer: Self-pay | Admitting: Internal Medicine

## 2016-09-27 ENCOUNTER — Ambulatory Visit (INDEPENDENT_AMBULATORY_CARE_PROVIDER_SITE_OTHER)
Admission: RE | Admit: 2016-09-27 | Discharge: 2016-09-27 | Disposition: A | Discharge status: Home / Self Care | Payer: Medicare Other | Source: Ambulatory Visit | Attending: Internal Medicine | Admitting: Internal Medicine

## 2016-09-27 ENCOUNTER — Ambulatory Visit (INDEPENDENT_AMBULATORY_CARE_PROVIDER_SITE_OTHER): Payer: Medicare Other | Admitting: Obstetrics & Gynecology

## 2016-09-27 ENCOUNTER — Encounter: Payer: Self-pay | Admitting: Obstetrics & Gynecology

## 2016-09-27 ENCOUNTER — Ambulatory Visit (INDEPENDENT_AMBULATORY_CARE_PROVIDER_SITE_OTHER): Payer: Medicare Other | Admitting: Internal Medicine

## 2016-09-27 VITALS — BP 132/64 | HR 68 | Resp 24 | Ht 65.5 in | Wt 157.0 lb

## 2016-09-27 VITALS — BP 124/74 | HR 88 | Temp 98.2°F | Resp 20 | Ht 65.5 in | Wt 157.8 lb

## 2016-09-27 DIAGNOSIS — R05 Cough: Secondary | ICD-10-CM | POA: Diagnosis not present

## 2016-09-27 DIAGNOSIS — Z124 Encounter for screening for malignant neoplasm of cervix: Secondary | ICD-10-CM | POA: Diagnosis not present

## 2016-09-27 DIAGNOSIS — Z01419 Encounter for gynecological examination (general) (routine) without abnormal findings: Secondary | ICD-10-CM

## 2016-09-27 DIAGNOSIS — R059 Cough, unspecified: Secondary | ICD-10-CM

## 2016-09-27 DIAGNOSIS — J988 Other specified respiratory disorders: Secondary | ICD-10-CM | POA: Insufficient documentation

## 2016-09-27 DIAGNOSIS — J441 Chronic obstructive pulmonary disease with (acute) exacerbation: Secondary | ICD-10-CM | POA: Diagnosis not present

## 2016-09-27 DIAGNOSIS — Z8551 Personal history of malignant neoplasm of bladder: Secondary | ICD-10-CM

## 2016-09-27 LAB — POCT EXHALED NITRIC OXIDE: FeNO level (ppb): 7

## 2016-09-27 MED ORDER — AMOXICILLIN-POT CLAVULANATE 875-125 MG PO TABS
1.0000 | ORAL_TABLET | Freq: Two times a day (BID) | ORAL | 0 refills | Status: AC
Start: 2016-09-27 — End: 2016-10-07

## 2016-09-27 MED ORDER — INDACATEROL-GLYCOPYRROLATE 27.5-15.6 MCG IN CAPS: 1.0000 | ORAL_CAPSULE | Freq: Two times a day (BID) | RESPIRATORY_TRACT | 11 refills | Status: DC

## 2016-09-27 NOTE — Progress Notes (Signed)
Subjective:  Patient ID: [name], female    DOB: March 15, 1935  Age: 82 y.o. MRN: [id number]  CC: Cough and COPD   HPI [name] presents for A 3-4 week history of cough. When this started she took a course of a cephalosporin antibiotic and got better but now for the last week she tells me the cough is worsening and she is bringing up thick, yellow-green phlegm. She was asked to start a LABA/LAMA combination but instead she was treated with short acting therapy with Combivent inhaler. She tells me Combivent helps but the relief doesn't last very long.  Outpatient Medications Prior to Visit  Medication Sig Dispense Refill  . atorvastatin (LIPITOR) 10 MG tablet TAKE ONE TABLET EACH DAY AT 6PM 30 tablet 2  . digoxin (DIGOX) 0.125 MG tablet Take 1 tablet (0.125 mg total) by mouth daily. NEED OV. 30 tablet 1  . diltiazem (CARDIZEM CD) 240 MG 24 hr capsule TAKE ONE CAPSULE EACH DAY 90 capsule 2  . latanoprost (XALATAN) 0.005 % ophthalmic solution Place 1 drop into both eyes at bedtime. Reported on 11/09/2015    . metoprolol tartrate (LOPRESSOR) 25 MG tablet Take 25 mg by mouth as needed. Reported on 11/09/2015  0  . pantoprazole (PROTONIX) 40 MG tablet TAKE ONE TABLET EACH DAY 90 tablet 3  . SIMBRINZA 1-0.2 % SUSP Place 1 drop into both eyes 2 (two) times daily.   99  . Indacaterol-Glycopyrrolate (UTIBRON NEOHALER) 27.5-15.6 MCG CAPS Place 1 puff into inhaler and inhale 2 (two) times daily. 60 capsule 11  . Ipratropium-Albuterol (COMBIVENT RESPIMAT) 20-100 MCG/ACT AERS respimat Inhale 1 puff into the lungs every 6 (six) hours. 4 g 5   No facility-administered medications prior to visit.     ROS Review of Systems  Constitutional: Negative.  Negative for chills, diaphoresis, fatigue and fever.  HENT: Negative.  Negative for facial swelling, sinus pressure and trouble swallowing.   Eyes: Negative.   Respiratory: Positive for cough, shortness of breath and wheezing. Negative for  chest tightness and stridor.   Cardiovascular: Negative for chest pain, palpitations and leg swelling.  Gastrointestinal: Negative.  Negative for abdominal pain, blood in stool, constipation, diarrhea, nausea and vomiting.  Endocrine: Negative.   Genitourinary: Negative.   Musculoskeletal: Negative.  Negative for back pain and neck pain.  Skin: Negative.   Allergic/Immunologic: Negative.   Neurological: Negative.   Hematological: Negative for adenopathy. Does not bruise/bleed easily.  Psychiatric/Behavioral: Negative.     Objective:  BP 124/74 (BP Location: Left Arm, Patient Position: Sitting, Cuff Size: Normal)   Pulse 88   Temp 98.2 F (36.8 C) (Oral)   Resp 20   Ht 5' 5.5" (1.664 m)   Wt 157 lb 12 oz (71.6 kg)   LMP 12/03/2002   SpO2 95%   BMI 25.85 kg/m   BP Readings from Last 3 Encounters:  09/27/16 124/74  09/27/16 132/64  09/08/16 110/62    Wt Readings from Last 3 Encounters:  09/27/16 157 lb 12 oz (71.6 kg)  09/27/16 157 lb (71.2 kg)  09/08/16 156 lb 4 oz (70.9 kg)    Physical Exam  Constitutional: She is oriented to person, place, and time.  Non-toxic appearance. She does not have a sickly appearance. She does not appear ill. No distress.  HENT:  Mouth/Throat: Oropharynx is clear and moist. No oropharyngeal exudate.  Eyes: Conjunctivae are normal. Right eye exhibits no discharge. Left eye exhibits no discharge. No scleral icterus.  Neck: Normal  range of motion. Neck supple. No JVD present. No tracheal deviation present. No thyromegaly present.  Cardiovascular: Normal rate, normal heart sounds and intact distal pulses.  An irregularly irregular rhythm present. Exam reveals no gallop and no friction rub.   No murmur heard. Pulmonary/Chest: Accessory muscle usage present. No stridor. Tachypnea noted. No respiratory distress. She has decreased breath sounds in the right upper field, the right middle field, the right lower field, the left upper field, the left middle  field and the left lower field. She has no wheezes. She has no rhonchi. She has no rales. She exhibits no tenderness.  Abdominal: Soft. Bowel sounds are normal. She exhibits no distension and no mass. There is no tenderness. There is no rebound and no guarding.  Musculoskeletal: Normal range of motion. She exhibits no edema, tenderness or deformity.  Lymphadenopathy:    She has no cervical adenopathy.  Neurological: She is oriented to person, place, and time.  Skin: Skin is warm and dry. No rash noted. She is not diaphoretic. No erythema. No pallor.  Vitals reviewed.   Lab Results  Component Value Date   WBC 6.8 02/23/2015   HGB 13.8 02/23/2015   HCT 43.1 02/23/2015   PLT 283 02/23/2015   GLUCOSE 94 05/09/2016   CHOL 131 05/09/2016   TRIG 184.0 (H) 05/09/2016   HDL 46.80 05/09/2016   LDLCALC 48 05/09/2016   ALT 21 05/09/2016   AST 19 05/09/2016   NA 141 05/09/2016   K 4.1 05/09/2016   CL 103 05/09/2016   CREATININE 0.75 05/09/2016   BUN 17 05/09/2016   CO2 33 (H) 05/09/2016   TSH 1.70 05/09/2016   INR 1.06 02/05/2015   HGBA1C 6.0 05/09/2016    No results found.  Assessment & Plan:   [name] was seen today for cough and copd.  Diagnoses and all orders for this visit:  Cough- Her chest x-ray is negative for infection or mass. -     DG Chest 2 View; Future -     POCT EXHALED NITRIC OXIDE  COPD exacerbation ([name])- I've asked her to switch to a LABA/LAMA combination. I gave her samples of Utibron broad and showed her how to use it. She demonstrated proficiency with its use. Her FeNO score is low so I don't think she would benefit from any type of steroid therapy. -     Indacaterol-Glycopyrrolate (UTIBRON NEOHALER) 27.5-15.6 MCG CAPS; Place 1 puff into inhaler and inhale 2 (two) times daily.  RTI (respiratory tract infection)- I will treat the infection with Augmentin. -     amoxicillin-clavulanate (AUGMENTIN) 875-125 MG tablet; Take 1 tablet by mouth 2 (two) times  daily.   I have discontinued [name]'s Ipratropium-Albuterol. I am also having her start on amoxicillin-clavulanate. Additionally, I am having her maintain her latanoprost, SIMBRINZA, metoprolol tartrate, pantoprazole, diltiazem, atorvastatin, digoxin, and Indacaterol-Glycopyrrolate.  Meds ordered this encounter  Medications  . Indacaterol-Glycopyrrolate (UTIBRON NEOHALER) 27.5-15.6 MCG CAPS    Sig: Place 1 puff into inhaler and inhale 2 (two) times daily.    Dispense:  60 capsule    Refill:  11  . amoxicillin-clavulanate (AUGMENTIN) 875-125 MG tablet    Sig: Take 1 tablet by mouth 2 (two) times daily.    Dispense:  20 tablet    Refill:  0     Follow-up: Return in about 4 weeks (around 10/25/2016).  [name], MD

## 2016-09-27 NOTE — Patient Instructions (Signed)
Cough, Adult Coughing is a reflex that clears your throat and your airways. Coughing helps to heal and protect your lungs. It is normal to cough occasionally, but a cough that happens with other symptoms or lasts a long time may be a sign of a condition that needs treatment. A cough may last only 2-3 weeks (acute), or it may last longer than 8 weeks (chronic). What are the causes? Coughing is commonly caused by:  Breathing in substances that irritate your lungs.  A viral or bacterial respiratory infection.  Allergies.  Asthma.  Postnasal drip.  Smoking.  Acid backing up from the stomach into the esophagus (gastroesophageal reflux).  Certain medicines.  Chronic lung problems, including COPD (or rarely, lung cancer).  Other medical conditions such as heart failure.  Follow these instructions at home: Pay attention to any changes in your symptoms. Take these actions to help with your discomfort:  Take medicines only as told by your health care provider. ? If you were prescribed an antibiotic medicine, take it as told by your health care provider. Do not stop taking the antibiotic even if you start to feel better. ? Talk with your health care provider before you take a cough suppressant medicine.  Drink enough fluid to keep your urine clear or pale yellow.  If the air is dry, use a cold steam vaporizer or humidifier in your bedroom or your home to help loosen secretions.  Avoid anything that causes you to cough at work or at home.  If your cough is worse at night, try sleeping in a semi-upright position.  Avoid cigarette smoke. If you smoke, quit smoking. If you need help quitting, ask your health care provider.  Avoid caffeine.  Avoid alcohol.  Rest as needed.  Contact a health care provider if:  You have new symptoms.  You cough up pus.  Your cough does not get better after 2-3 weeks, or your cough gets worse.  You cannot control your cough with suppressant  medicines and you are losing sleep.  You develop pain that is getting worse or pain that is not controlled with pain medicines.  You have a fever.  You have unexplained weight loss.  You have night sweats. Get help right away if:  You cough up blood.  You have difficulty breathing.  Your heartbeat is very fast. This information is not intended to replace advice given to you by your health care provider. Make sure you discuss any questions you have with your health care provider. Document Released: 02/16/2011 Document Revised: 01/26/2016 Document Reviewed: 10/27/2014 Elsevier Interactive Patient Education  2017 [employer]  

## 2016-09-27 NOTE — Progress Notes (Signed)
82 y.o. [name] MarriedCaucasianF here for annual exam.  She is here for an AEX.  Having some respiratory issues today.  Has a cough.  Denies fever.  H/O COPD.  Reports she's been sick since since right after Christmas.  Having increased SOB the past couple of days.  This is obvious today.  Drove herself.  Declines need for assistance today.  Patient's last menstrual period was 12/03/2002.          Sexually active: No.  The current method of family planning is tubal ligation.    Exercising: No.  The patient does not participate in regular exercise at present. Smoker:  Former smoker  Health Maintenance: Pap: 01/01/14 negative    History of abnormal Pap:  no MMG:  08/15/16 BIRADS 1 negative  Colonoscopy: 11/13/12 polyps  BMD:   11/02/13 TDaP:  11/09/15  Pneumonia vaccine(s):  10/09/11, 05/20/14  Zostavax:   09/04/07  Hep C testing: not indicated  Screening Labs: PCP, Hb today: PCP, Urine today: has urologist- Dr. Tresa Moore    reports that she quit smoking about 13 years ago. Her smoking use included Cigarettes. She has a 40.00 pack-year smoking history. She has never used smokeless tobacco. She reports that she does not drink alcohol or use drugs.  Past Medical History:  Diagnosis Date  . Arthritis   . COPD (chronic obstructive pulmonary disease) ([place])   . Dyspnea on exertion   . GERD (gastroesophageal reflux disease)   . Glaucoma of both eyes   . History of atrial fibrillation without current medication 2010   POST SURG 2010--  PER PT NO ISSUES SINCE  . History of breast cancer    DX DCIS IN 2004--  S/P RIGHT MASTECTOMY , NO CHEMORADIATION---  NO RECURRENCE  . History of CHF (congestive heart failure)    POST SURG 2010  . History of hypertension   . History of shingles    02/ 2015--  back of neck and left flank--  no residual pain  . ICH (intracerebral hemorrhage) ([place]) 02/05/2015  . Nephrolithiasis   . Recurrent bladder papillary carcinoma ([place]) first dx 06/ 2014   s/p  turbt's  and instillation  mitomycin c (chemo)//dx again in 2015-different type    Past Surgical History:  Procedure Laterality Date  . CATARACT EXTRACTION W/ INTRAOCULAR LENS  IMPLANT, BILATERAL    . CRANIECTOMY N/A 02/05/2015   Procedure: SUBOCCIPITAL CRANIECTOMY EVACUATION OF HEMATOMA;  Surgeon: Consuella Lose, MD;  Location: [facility];  Service: Neurosurgery;  Laterality: N/A;  . CYSTOSCOPY W/ RETROGRADES Bilateral 08/11/2014   Procedure: CYSTOSCOPY WITH BILATERAL RETROGRADE PYELOGRAM AND MITOMYCIN INSTILLATION;  Surgeon: Alexis Frock, MD;  Location: [facility];  Service: Urology;  Laterality: Bilateral;  . CYSTOSCOPY W/ URETERAL STENT PLACEMENT Bilateral 03/26/2013   Procedure: CYSTOSCOPY WITH BILATERAL RETROGRADE PYELOGRAM/URETERAL STENT PLACEMENT;  Surgeon: Alexis Frock, MD;  Location: [facility];  Service: Urology;  Laterality: Bilateral;  . CYSTOSCOPY W/ URETERAL STENT PLACEMENT Left 05/13/2013   Procedure: CYSTOSCOPY WITH RETROGRADE PYELOGRAM/URETERAL STENT PLACEMENT STENT EXCHANGE;  Surgeon: Alexis Frock, MD;  Location: [facility];  Service: Urology;  Laterality: Left;  . PARTIAL MASTECTOMY WITH NEEDLE LOCALIZATION Right 01-14-2003   DCIS  . REMOVAL CYST LEFT HAND  2013  . TOTAL KNEE ARTHROPLASTY Right 03-22-2009  . TOTAL MASTECTOMY Right 02-03-2003   W/ SLN BX  AND  POST 03-01-2003 EVACUATION HEMATOMA  . TRANSTHORACIC ECHOCARDIOGRAM  03-25-2009   MILD LVF/ EF 70-80%/ MILD INCREASE SYSTOLIC PULMONARY  PRESSURE  . TRANSURETHRAL RESECTION OF BLADDER TUMOR WITH GYRUS (TURBT-GYRUS) N/A 03/26/2013   Procedure: TRANSURETHRAL RESECTION OF BLADDER TUMOR WITH GYRUS (TURBT-GYRUS);  Surgeon: Alexis Frock, MD;  Location: [facility];  Service: Urology;  Laterality: N/A;  . TRANSURETHRAL RESECTION OF BLADDER TUMOR WITH GYRUS (TURBT-GYRUS) N/A 05/13/2013   Procedure: TRANSURETHRAL RESECTION OF BLADDER TUMOR WITH GYRUS (TURBT-GYRUS)  RE-STAGING  TRANSURETHRAL RESECTION OF BLADDER TUMOR, LEFT RETROGRADE PYELOGRAM AND STENT EXCHANGE;  Surgeon: Alexis Frock, MD;  Location: [facility];  Service: Urology;  Laterality: N/A;  . TRANSURETHRAL RESECTION OF BLADDER TUMOR WITH GYRUS (TURBT-GYRUS) N/A 08/11/2014   Procedure: TRANSURETHRAL RESECTION OF BLADDER TUMOR WITH GYRUS (TURBT-GYRUS);  Surgeon: Alexis Frock, MD;  Location: [facility];  Service: Urology;  Laterality: N/A;  . TUBAL LIGATION      Current Outpatient Prescriptions  Medication Sig Dispense Refill  . atorvastatin (LIPITOR) 10 MG tablet TAKE ONE TABLET EACH DAY AT 6PM 30 tablet 2  . digoxin (DIGOX) 0.125 MG tablet Take 1 tablet (0.125 mg total) by mouth daily. NEED OV. 30 tablet 1  . diltiazem (CARDIZEM CD) 240 MG 24 hr capsule TAKE ONE CAPSULE EACH DAY 90 capsule 2  . Indacaterol-Glycopyrrolate (UTIBRON NEOHALER) 27.5-15.6 MCG CAPS Place 1 puff into inhaler and inhale 2 (two) times daily. 60 capsule 11  . Ipratropium-Albuterol (COMBIVENT RESPIMAT) 20-100 MCG/ACT AERS respimat Inhale 1 puff into the lungs every 6 (six) hours. 4 g 5  . latanoprost (XALATAN) 0.005 % ophthalmic solution Place 1 drop into both eyes at bedtime. Reported on 11/09/2015    . metoprolol tartrate (LOPRESSOR) 25 MG tablet Take 25 mg by mouth as needed. Reported on 11/09/2015  0  . pantoprazole (PROTONIX) 40 MG tablet TAKE ONE TABLET EACH DAY 90 tablet 3  . SIMBRINZA 1-0.2 % SUSP Place 1 drop into both eyes 2 (two) times daily.   99   No current facility-administered medications for this visit.     Family History  Problem Relation Age of Onset  . Colon cancer Mother 28  . Colon cancer Maternal Grandmother   . Breast cancer Maternal Aunt 41  . Cancer Maternal Aunt     ? ovarian  . Breast cancer Other 60    breast cancer, ovarian cancer  . Breast cancer Daughter 1  . Heart attack Neg Hx   . Stroke Paternal Grandmother   . Stroke Maternal Grandmother   . Hypertension Neg  Hx     ROS:  + for cough, SOB, fatigue.  Otherwise, a comprehensive ROS was negative.  Exam:   BP 132/64 (BP Location: Left Arm, Patient Position: Sitting, Cuff Size: Normal)   Pulse 68   Resp (!) 24   Ht 5' 5.5" (1.664 m)   Wt 157 lb (71.2 kg)   LMP 12/03/2002   BMI 25.73 kg/m     Height: 5' 5.5" (166.4 cm)  Ht Readings from Last 3 Encounters:  09/27/16 5' 5.5" (1.664 m)  09/08/16 _0  (1.702 m)  05/09/16 _1  (1.702 m)    General appearance: alert, cooperative and appears mildly SOB as well as tired Head: Normocephalic, without obvious abnormality, atraumatic Neck: no adenopathy, supple, symmetrical, trachea midline and thyroid normal to inspection and palpation Lungs: wheezing present in LLL, no rhonchi or rales Breasts: normal appearance, no masses or tenderness Heart: rate is irregularly irregular, no abnormal heart sounds noted Abdomen: soft, non-tender; bowel sounds normal; no masses,  no organomegaly Extremities: extremities normal, atraumatic, no  cyanosis or edema Skin: Skin color, texture, turgor normal. No rashes or lesions Lymph nodes: Cervical, supraclavicular, and axillary nodes normal. No abnormal inguinal nodes palpated Neurologic: Grossly normal   Pelvic: External genitalia:  no lesions              Urethra:  normal appearing urethra with no masses, tenderness or lesions              Bartholins and Skenes: normal                 Vagina: normal appearing vagina with normal color and discharge, no lesions              Cervix: no lesions              Pap taken: Yes.   Bimanual Exam:  Uterus:  normal size, contour, position, consistency, mobility, non-tender              Adnexa: normal adnexa and no mass, fullness, tenderness               Rectovaginal: Confirms               Anus:  normal sphincter tone, no lesions  Chaperone was present for exam.  A:  Well Woman with normal exam H/O nephrolithiasis Bladder cancer 2014 that is followed by Dr.  Tammi Klippel.  She was treated after biopsy 7/14 with bladder chemo.  Follow up bx 9/14 was negative.   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 (Daughter with ductal Br Ca. Neg genetic testing in daughter.) Hypertension H/O cerebellar hemorrhage with hematoma evacuation and associated stroke 6/16 H/O afib with associated RVR, currently irregularly irregular today  P: Mammogram guidelines reviewed Pap obtained today Pt has appt with Dr. Ronnald Ramp this afternoon.  Declines having me call to see if can be seen sooner.  States she doesn't feel as badly as I think she does.   Vaccine UTD. Return annually or prn

## 2016-09-27 NOTE — Progress Notes (Signed)
Pre visit review using our clinic review tool, if applicable. No additional management support is needed unless otherwise documented below in the visit note.

## 2016-10-03 LAB — IPS PAP SMEAR ONLY

## 2016-10-05 ENCOUNTER — Telehealth: Payer: Self-pay | Admitting: *Deleted

## 2016-10-05 NOTE — Telephone Encounter (Signed)
Rec'd mag back on cover my meds stating PA was unfavorable (DENIED) will send letter to MD & patient w/denial reason, and appeal information if need...[name]

## 2016-10-05 NOTE — Telephone Encounter (Signed)
Rec'd fax pt needing PA on the Utibron inhaler. Completed PA on cover-my-meds. Key: [id number] waiting on approval status...[name]

## 2016-10-05 NOTE — Telephone Encounter (Signed)
Your information has been submitted to [place]. [employer] [place] will review the request and notify you of the determination decision directly, typically within 3 business days of your submission and once all necessary information is received.  You will also receive your request decision electronically. To check for an update later, open the request again from your dashboard....[name]

## 2016-10-05 NOTE — Telephone Encounter (Signed)
Rec'd call from insurance [employer] w/denial info for the uitbron neohaler. He states pt must try & fail combivent Respivent, which was documented that she had already taking, Advair Diskus, Breo, Serevent, and Symbicort...Johny Chess

## 2016-10-09 ENCOUNTER — Other Ambulatory Visit: Payer: Self-pay | Admitting: Cardiology

## 2016-10-09 NOTE — Telephone Encounter (Signed)
ok 

## 2016-11-07 ENCOUNTER — Ambulatory Visit (INDEPENDENT_AMBULATORY_CARE_PROVIDER_SITE_OTHER): Payer: Medicare Other | Admitting: Internal Medicine

## 2016-11-07 ENCOUNTER — Encounter: Payer: Self-pay | Admitting: Internal Medicine

## 2016-11-07 VITALS — BP 112/74 | HR 86 | Temp 97.9°F | Resp 20 | Ht 65.5 in | Wt 159.0 lb

## 2016-11-07 DIAGNOSIS — I4891 Unspecified atrial fibrillation: Secondary | ICD-10-CM

## 2016-11-07 DIAGNOSIS — I10 Essential (primary) hypertension: Secondary | ICD-10-CM

## 2016-11-07 DIAGNOSIS — J441 Chronic obstructive pulmonary disease with (acute) exacerbation: Secondary | ICD-10-CM | POA: Diagnosis not present

## 2016-11-07 NOTE — Patient Instructions (Signed)
Chronic Obstructive Pulmonary Disease Chronic obstructive pulmonary disease (COPD) is a common lung condition in which airflow from the lungs is limited. COPD is a general term that can be used to describe many different lung problems that limit airflow, including both chronic bronchitis and emphysema. If you have COPD, your lung function will probably never return to normal, but there are measures you can take to improve lung function and make yourself feel better. What are the causes?  Smoking (common).  Exposure to secondhand smoke.  Genetic problems.  Chronic inflammatory lung diseases or recurrent infections. What are the signs or symptoms?  Shortness of breath, especially with physical activity.  Deep, persistent (chronic) cough with a large amount of thick mucus.  Wheezing.  Rapid breaths (tachypnea).  Gray or bluish discoloration (cyanosis) of the skin, especially in your fingers, toes, or lips.  Fatigue.  Weight loss.  Frequent infections or episodes when breathing symptoms become much worse (exacerbations).  Chest tightness. How is this diagnosed? Your health care provider will take a medical history and perform a physical examination to diagnose COPD. Additional tests for COPD may include:  Lung (pulmonary) function tests.  Chest X-ray.  CT scan.  Blood tests. How is this treated? Treatment for COPD may include:  Inhaler and nebulizer medicines. These help manage the symptoms of COPD and make your breathing more comfortable.  Supplemental oxygen. Supplemental oxygen is only helpful if you have a low oxygen level in your blood.  Exercise and physical activity. These are beneficial for nearly all people with COPD.  Lung surgery or transplant.  Nutrition therapy to gain weight, if you are underweight.  Pulmonary rehabilitation. This may involve working with a team of health care providers and specialists, such as respiratory, occupational, and physical  therapists. Follow these instructions at home:  Take all medicines (inhaled or pills) as directed by your health care provider.  Avoid over-the-counter medicines or cough syrups that dry up your airway (such as antihistamines) and slow down the elimination of secretions unless instructed otherwise by your health care provider.  If you are a smoker, the most important thing that you can do is stop smoking. Continuing to smoke will cause further lung damage and breathing trouble. Ask your health care provider for help with quitting smoking. He or she can direct you to community resources or hospitals that provide support.  Avoid exposure to irritants such as smoke, chemicals, and fumes that aggravate your breathing.  Use oxygen therapy and pulmonary rehabilitation if directed by your health care provider. If you require home oxygen therapy, ask your health care provider whether you should purchase a pulse oximeter to measure your oxygen level at home.  Avoid contact with individuals who have a contagious illness.  Avoid extreme temperature and humidity changes.  Eat healthy foods. Eating smaller, more frequent meals and resting before meals may help you maintain your strength.  Stay active, but balance activity with periods of rest. Exercise and physical activity will help you maintain your ability to do things you want to do.  Preventing infection and hospitalization is very important when you have COPD. Make sure to receive all the vaccines your health care provider recommends, especially the pneumococcal and influenza vaccines. Ask your health care provider whether you need a pneumonia vaccine.  Learn and use relaxation techniques to manage stress.  Learn and use controlled breathing techniques as directed by your health care provider. Controlled breathing techniques include: 1. Pursed lip breathing. Start by breathing in (inhaling)  through your nose for 1 second. Then, purse your lips as  if you were going to whistle and breathe out (exhale) through the pursed lips for 2 seconds. 2. Diaphragmatic breathing. Start by putting one hand on your abdomen just above your waist. Inhale slowly through your nose. The hand on your abdomen should move out. Then purse your lips and exhale slowly. You should be able to feel the hand on your abdomen moving in as you exhale.  Learn and use controlled coughing to clear mucus from your lungs. Controlled coughing is a series of short, progressive coughs. The steps of controlled coughing are: 1. Lean your head slightly forward. 2. Breathe in deeply using diaphragmatic breathing. 3. Try to hold your breath for 3 seconds. 4. Keep your mouth slightly open while coughing twice. 5. Spit any mucus out into a tissue. 6. Rest and repeat the steps once or twice as needed. Contact a health care provider if:  You are coughing up more mucus than usual.  There is a change in the color or thickness of your mucus.  Your breathing is more labored than usual.  Your breathing is faster than usual. Get help right away if:  You have shortness of breath while you are resting.  You have shortness of breath that prevents you from:  Being able to talk.  Performing your usual physical activities.  You have chest pain lasting longer than 5 minutes.  Your skin color is more cyanotic than usual.  You measure low oxygen saturations for longer than 5 minutes with a pulse oximeter. This information is not intended to replace advice given to you by your health care provider. Make sure you discuss any questions you have with your health care provider. Document Released: 05/30/2005 Document Revised: 01/26/2016 Document Reviewed: 04/16/2013 Elsevier Interactive Patient Education  2017 [employer].

## 2016-11-07 NOTE — Progress Notes (Signed)
Pre visit review using our clinic review tool, if applicable. No additional management support is needed unless otherwise documented below in the visit note.

## 2016-11-07 NOTE — Progress Notes (Signed)
Subjective:  Patient ID: [name], female    DOB: October 10, 1934  Age: 82 y.o. MRN: [id number]  CC: COPD and Atrial Fibrillation   HPI [name] presents for f/up - She has a chronic, mild nonproductive cough and shortness of breath. She has decided not to use any inhalers. She otherwise feels well and offers no other complaints.  Outpatient Medications Prior to Visit  Medication Sig Dispense Refill  . atorvastatin (LIPITOR) 10 MG tablet TAKE ONE TABLET EACH DAY AT 6PM 30 tablet 1  . digoxin (DIGOX) 0.125 MG tablet Take 1 tablet (0.125 mg total) by mouth daily. NEED OV. 30 tablet 1  . diltiazem (CARDIZEM CD) 240 MG 24 hr capsule TAKE ONE CAPSULE EACH DAY 90 capsule 2  . latanoprost (XALATAN) 0.005 % ophthalmic solution Place 1 drop into both eyes at bedtime. Reported on 11/09/2015    . pantoprazole (PROTONIX) 40 MG tablet TAKE ONE TABLET EACH DAY 90 tablet 3  . SIMBRINZA 1-0.2 % SUSP Place 1 drop into both eyes 2 (two) times daily.   99  . Indacaterol-Glycopyrrolate (UTIBRON NEOHALER) 27.5-15.6 MCG CAPS Place 1 puff into inhaler and inhale 2 (two) times daily. (Patient not taking: Reported on 11/07/2016) 60 capsule 11  . metoprolol tartrate (LOPRESSOR) 25 MG tablet Take 25 mg by mouth as needed. Reported on 11/09/2015  0   No facility-administered medications prior to visit.     ROS Review of Systems  Constitutional: Negative.  Negative for appetite change, diaphoresis, fatigue and unexpected weight change.  HENT: Negative.  Negative for sinus pressure and trouble swallowing.   Eyes: Negative for visual disturbance.  Respiratory: Positive for cough and shortness of breath. Negative for chest tightness, wheezing and stridor.   Cardiovascular: Negative for chest pain, palpitations and leg swelling.  Gastrointestinal: Negative for abdominal pain, constipation, diarrhea, nausea and vomiting.  Endocrine: Negative.   Genitourinary: Negative.  Negative for difficulty urinating.    Musculoskeletal: Negative.  Negative for back pain and myalgias.  Skin: Negative.  Negative for color change and rash.  Allergic/Immunologic: Negative.   Neurological: Negative.  Negative for dizziness.  Hematological: Negative for adenopathy. Does not bruise/bleed easily.  Psychiatric/Behavioral: Negative.     Objective:  BP 112/74 (BP Location: Left Arm, Patient Position: Sitting, Cuff Size: Normal)   Pulse 86   Temp 97.9 F (36.6 C) (Oral)   Resp 20   Ht 5' 5.5" (1.664 m)   Wt 159 lb (72.1 kg)   LMP 12/03/2002   SpO2 (!) 89%   BMI 26.06 kg/m   BP Readings from Last 3 Encounters:  11/07/16 112/74  09/27/16 124/74  09/27/16 132/64    Wt Readings from Last 3 Encounters:  11/07/16 159 lb (72.1 kg)  09/27/16 157 lb 12 oz (71.6 kg)  09/27/16 157 lb (71.2 kg)    Physical Exam  Constitutional: She is oriented to person, place, and time. No distress.  HENT:  Mouth/Throat: Oropharynx is clear and moist. No oropharyngeal exudate.  Eyes: Conjunctivae are normal. Right eye exhibits no discharge. Left eye exhibits no discharge. No scleral icterus.  Neck: Normal range of motion. Neck supple. No JVD present. No tracheal deviation present. No thyromegaly present.  Cardiovascular: Normal rate, regular rhythm, normal heart sounds and intact distal pulses.  Exam reveals no gallop and no friction rub.   No murmur heard. Pulmonary/Chest: Effort normal and breath sounds normal. No stridor. No respiratory distress. She has no wheezes. She has no rales. She exhibits no  tenderness.  Abdominal: Soft. Bowel sounds are normal. She exhibits no distension and no mass. There is no tenderness. There is no rebound and no guarding.  Musculoskeletal: Normal range of motion. She exhibits no edema, tenderness or deformity.  Lymphadenopathy:    She has no cervical adenopathy.  Neurological: She is oriented to person, place, and time.  Skin: Skin is warm and dry. No rash noted. She is not diaphoretic. No  erythema. No pallor.  Vitals reviewed.   Lab Results  Component Value Date   WBC 6.8 02/23/2015   HGB 13.8 02/23/2015   HCT 43.1 02/23/2015   PLT 283 02/23/2015   GLUCOSE 94 05/09/2016   CHOL 131 05/09/2016   TRIG 184.0 (H) 05/09/2016   HDL 46.80 05/09/2016   LDLCALC 48 05/09/2016   ALT 21 05/09/2016   AST 19 05/09/2016   NA 141 05/09/2016   K 4.1 05/09/2016   CL 103 05/09/2016   CREATININE 0.75 05/09/2016   BUN 17 05/09/2016   CO2 33 (H) 05/09/2016   TSH 1.70 05/09/2016   INR 1.06 02/05/2015   HGBA1C 6.0 05/09/2016    Dg Chest 2 View  Result Date: 09/27/2016 CLINICAL DATA:  Wheezing, dyspnea, cough despite antibiotic treatment 1 month ago. History of COPD, CHF, former smoker. EXAM: CHEST  2 VIEW COMPARISON:  Portable chest x-ray of February 05, 2015 FINDINGS: The lungs are mildly hyperinflated. The interstitial markings are coarse bilaterally. There is no alveolar infiltrate. There is no pleural effusion. The heart is top-normal in size. The pulmonary vascularity is not engorged. There is calcification in the wall of the thoracic aorta. The bony thorax exhibits no acute abnormality. IMPRESSION: COPD with increased interstitial markings consistent with chronic fibrosis-smoking related changes. No alveolar pneumonia nor pulmonary edema. Thoracic aortic atherosclerosis. Electronically Signed   By: Susan  Dudley M.D.   On: 09/27/2016 16:00    Assessment & Plan:   [name] was seen today for copd and atrial fibrillation.  Diagnoses and all orders for this visit:  Atrial fibrillation with RVR ([place])- she has good rate and rhythm control, anticoagulation is contraindicated due to a to a prior intracranial hemorrhage  Benign essential HTN- her blood pressure is well-controlled, lites and renal function are stable.  COPD exacerbation ([place])- this has resolved, she is noncompliant with inhaler therapy, will continue to monitor for the development of another exacerbation.   I am having  [name] maintain her latanoprost, SIMBRINZA, metoprolol tartrate, pantoprazole, diltiazem, digoxin, Indacaterol-Glycopyrrolate, and atorvastatin.  No orders of the defined types were placed in this encounter.    Follow-up: Return in about 6 months (around 05/10/2017).  [name], MD

## 2016-11-20 ENCOUNTER — Other Ambulatory Visit: Payer: Self-pay | Admitting: Internal Medicine

## 2016-11-29 ENCOUNTER — Other Ambulatory Visit: Payer: Self-pay | Admitting: Cardiology

## 2016-12-14 ENCOUNTER — Telehealth: Payer: Self-pay | Admitting: Internal Medicine

## 2016-12-14 NOTE — Telephone Encounter (Signed)
Called patient to schedule awv. Lvm for patient to call office to schedule appt.

## 2016-12-18 ENCOUNTER — Other Ambulatory Visit: Payer: Self-pay | Admitting: Cardiology

## 2016-12-27 ENCOUNTER — Other Ambulatory Visit: Payer: Self-pay | Admitting: Cardiology

## 2016-12-27 NOTE — Telephone Encounter (Signed)
Rx has been sent to the pharmacy electronically.

## 2017-01-15 ENCOUNTER — Other Ambulatory Visit: Payer: Self-pay | Admitting: Cardiology

## 2017-02-12 ENCOUNTER — Other Ambulatory Visit: Payer: Self-pay | Admitting: Cardiology

## 2017-03-19 ENCOUNTER — Other Ambulatory Visit: Payer: Self-pay | Admitting: Cardiology

## 2017-03-26 ENCOUNTER — Other Ambulatory Visit: Payer: Self-pay | Admitting: Cardiology

## 2017-03-26 DIAGNOSIS — H401134 Primary open-angle glaucoma, bilateral, indeterminate stage: Secondary | ICD-10-CM | POA: Diagnosis not present

## 2017-03-26 DIAGNOSIS — H52203 Unspecified astigmatism, bilateral: Secondary | ICD-10-CM | POA: Diagnosis not present

## 2017-03-26 DIAGNOSIS — Z961 Presence of intraocular lens: Secondary | ICD-10-CM | POA: Diagnosis not present

## 2017-03-26 DIAGNOSIS — H04123 Dry eye syndrome of bilateral lacrimal glands: Secondary | ICD-10-CM | POA: Diagnosis not present

## 2017-03-26 NOTE — Telephone Encounter (Signed)
REFILL 

## 2017-03-29 ENCOUNTER — Other Ambulatory Visit: Payer: Self-pay

## 2017-04-10 DIAGNOSIS — H16041 Marginal corneal ulcer, right eye: Secondary | ICD-10-CM | POA: Diagnosis not present

## 2017-04-12 ENCOUNTER — Other Ambulatory Visit: Payer: Self-pay | Admitting: Cardiology

## 2017-04-16 ENCOUNTER — Other Ambulatory Visit: Payer: Self-pay | Admitting: Cardiology

## 2017-04-16 NOTE — Progress Notes (Signed)
HPI: FU atrial fibrillation. Echocardiogram June 2016 showed vigorous LV function, mild left ventricular hypertrophy, grade 1 diastolic dysfunction and mild right atrial enlargement. Patient was admitted in June 2016 with a large right cerebellar hemorrhage. Note her only anticoagulate at that time was aspirin. The hemorrhage required surgical evacuation. Patient developed atrial fibrillation during the hospitalization with difficult to control heart rate. She was felt not to be a candidate for anticoagulation given intracranial hemorrhage. Holter 7/16 showed atrial fibrillation with elevated rate; digoxin added. I recently contacted Dr Kathyrn Sheriff Neurosurgery to see if she would be a candidate for short-term anticoagulation which would then qualify her for a watchman device. He felt this could be pursued. Since last seen, she has some dyspnea on exertion. No orthopnea, PND, pedal edema, chest pain, palpitations or syncope.  Current Outpatient Prescriptions  Medication Sig Dispense Refill  . atorvastatin (LIPITOR) 10 MG tablet TAKE ONE TABLET DAILY AT 6PM 30 tablet 2  . digoxin (DIGOX) 0.125 MG tablet Take 1 tablet (0.125 mg total) by mouth daily. KEEP OV. 90 tablet 0  . diltiazem (CARDIZEM CD) 240 MG 24 hr capsule TAKE ONE CAPSULE EACH DAY 90 capsule 1  . Indacaterol-Glycopyrrolate (UTIBRON NEOHALER) 27.5-15.6 MCG CAPS Place 1 puff into inhaler and inhale 2 (two) times daily. 60 capsule 11  . latanoprost (XALATAN) 0.005 % ophthalmic solution Place 1 drop into both eyes at bedtime. Reported on 11/09/2015    . pantoprazole (PROTONIX) 40 MG tablet TAKE ONE TABLET EACH DAY 90 tablet 3  . SIMBRINZA 1-0.2 % SUSP Place 1 drop into both eyes 2 (two) times daily.   99   No current facility-administered medications for this visit.      Past Medical History:  Diagnosis Date  . Arthritis   . COPD (chronic obstructive pulmonary disease) ([place])   . Dyspnea on exertion   . GERD (gastroesophageal reflux  disease)   . Glaucoma of both eyes   . History of atrial fibrillation without current medication 2010   POST SURG 2010--  PER PT NO ISSUES SINCE  . History of breast cancer    DX DCIS IN 2004--  S/P RIGHT MASTECTOMY , NO CHEMORADIATION---  NO RECURRENCE  . History of CHF (congestive heart failure)    POST SURG 2010  . History of hypertension   . History of shingles    02/ 2015--  back of neck and left flank--  no residual pain  . ICH (intracerebral hemorrhage) ([place]) 02/05/2015  . Nephrolithiasis   . Recurrent bladder papillary carcinoma ([place]) first dx 06/ 2014   s/p  turbt's  and instillation mitomycin c (chemo)//dx again in 2015-different type    Past Surgical History:  Procedure Laterality Date  . CATARACT EXTRACTION W/ INTRAOCULAR LENS  IMPLANT, BILATERAL    . CRANIECTOMY N/A 02/05/2015   Procedure: SUBOCCIPITAL CRANIECTOMY EVACUATION OF HEMATOMA;  Surgeon: Consuella Lose, MD;  Location: [facility];  Service: Neurosurgery;  Laterality: N/A;  . CYSTOSCOPY W/ RETROGRADES Bilateral 08/11/2014   Procedure: CYSTOSCOPY WITH BILATERAL RETROGRADE PYELOGRAM AND MITOMYCIN INSTILLATION;  Surgeon: Alexis Frock, MD;  Location: [facility];  Service: Urology;  Laterality: Bilateral;  . CYSTOSCOPY W/ URETERAL STENT PLACEMENT Bilateral 03/26/2013   Procedure: CYSTOSCOPY WITH BILATERAL RETROGRADE PYELOGRAM/URETERAL STENT PLACEMENT;  Surgeon: Alexis Frock, MD;  Location: [facility];  Service: Urology;  Laterality: Bilateral;  . CYSTOSCOPY W/ URETERAL STENT PLACEMENT Left 05/13/2013   Procedure: CYSTOSCOPY WITH RETROGRADE PYELOGRAM/URETERAL STENT PLACEMENT STENT EXCHANGE;  Surgeon: Alexis Frock, MD;  Location: [facility];  Service: Urology;  Laterality: Left;  . PARTIAL MASTECTOMY WITH NEEDLE LOCALIZATION Right 01-14-2003   DCIS  . REMOVAL CYST LEFT HAND  2013  . TOTAL KNEE ARTHROPLASTY Right 03-22-2009  . TOTAL MASTECTOMY Right 02-03-2003   W/ SLN  BX  AND  POST 03-01-2003 EVACUATION HEMATOMA  . TRANSTHORACIC ECHOCARDIOGRAM  03-25-2009   MILD LVF/ EF 70-80%/ MILD INCREASE SYSTOLIC PULMONARY  PRESSURE  . TRANSURETHRAL RESECTION OF BLADDER TUMOR WITH GYRUS (TURBT-GYRUS) N/A 03/26/2013   Procedure: TRANSURETHRAL RESECTION OF BLADDER TUMOR WITH GYRUS (TURBT-GYRUS);  Surgeon: Alexis Frock, MD;  Location: [facility];  Service: Urology;  Laterality: N/A;  . TRANSURETHRAL RESECTION OF BLADDER TUMOR WITH GYRUS (TURBT-GYRUS) N/A 05/13/2013   Procedure: TRANSURETHRAL RESECTION OF BLADDER TUMOR WITH GYRUS (TURBT-GYRUS)  RE-STAGING TRANSURETHRAL RESECTION OF BLADDER TUMOR, LEFT RETROGRADE PYELOGRAM AND STENT EXCHANGE;  Surgeon: Alexis Frock, MD;  Location: [facility];  Service: Urology;  Laterality: N/A;  . TRANSURETHRAL RESECTION OF BLADDER TUMOR WITH GYRUS (TURBT-GYRUS) N/A 08/11/2014   Procedure: TRANSURETHRAL RESECTION OF BLADDER TUMOR WITH GYRUS (TURBT-GYRUS);  Surgeon: Alexis Frock, MD;  Location: [facility];  Service: Urology;  Laterality: N/A;  . TUBAL LIGATION      Social History   Social History  . Marital status: Married    Spouse name: N/A  . Number of children: N/A  . Years of education: N/A   Occupational History  . Not on file.   Social History Main Topics  . Smoking status: Former Smoker    Packs/day: 1.00    Years: 40.00    Types: Cigarettes    Quit date: 03/20/2003  . Smokeless tobacco: Never Used  . Alcohol use No  . Drug use: No  . Sexual activity: Yes    Partners: Male    Birth control/ protection: Surgical     Comment: BTL   Other Topics Concern  . Not on file   Social History Narrative  . No narrative on file    Family History  Problem Relation Age of Onset  . Colon cancer Mother 37  . Colon cancer Maternal Grandmother   . Stroke Maternal Grandmother   . Breast cancer Maternal Aunt 91  . Cancer Maternal Aunt        ? ovarian  . Breast cancer Other 60        breast cancer, ovarian cancer  . Breast cancer Daughter 56  . Stroke Paternal Grandmother   . Heart attack Neg Hx   . Hypertension Neg Hx     ROS: no fevers or chills, productive cough, hemoptysis, dysphasia, odynophagia, melena, hematochezia, dysuria, hematuria, rash, seizure activity, orthopnea, PND, pedal edema, claudication. Remaining systems are negative.  Physical Exam: Well-developed well-nourished in no acute distress.  Skin is warm and dry.  HEENT is normal.  Neck is supple.  Chest is clear to auscultation with normal expansion.  Cardiovascular exam is irregular Abdominal exam nontender or distended. No masses palpated. Extremities show no edema. neuro grossly intact  ECG-atrial fibrillation at a rate of 61. Left ventricular hypertrophy with repolarization abnormality. personally reviewed  A/P  1 atrial fibrillation-continue Cardizem and digoxin for rate control. CHADSvasc 5. She would benefit from anticoagulation to reduce risk of embolic stroke. However she has a history of intracranial hemorrhage. Neurosurgery was contacted previously and felt she could be on short-term anticoagulation if we wanted to pursue a watchman device. However patient declined and  understands the higher risk of CVA. She will not be on anticoagulation. Check digoxin level.  2 hypertension-blood pressure is controlled. Continue present medications. Check potassium and renal function.   3 hyperlipidemia-continue statin.Check lipids and liver.   4 chronic diastolic congestive heart failure-patient is euvolemic.  Kirk Ruths, MD

## 2017-04-18 ENCOUNTER — Encounter: Payer: Self-pay | Admitting: Cardiology

## 2017-04-18 ENCOUNTER — Ambulatory Visit (INDEPENDENT_AMBULATORY_CARE_PROVIDER_SITE_OTHER): Payer: Medicare Other | Admitting: Cardiology

## 2017-04-18 VITALS — BP 122/74 | HR 80 | Ht 65.5 in | Wt 159.0 lb

## 2017-04-18 DIAGNOSIS — E7800 Pure hypercholesterolemia, unspecified: Secondary | ICD-10-CM | POA: Diagnosis not present

## 2017-04-18 DIAGNOSIS — I480 Paroxysmal atrial fibrillation: Secondary | ICD-10-CM

## 2017-04-18 DIAGNOSIS — I10 Essential (primary) hypertension: Secondary | ICD-10-CM | POA: Diagnosis not present

## 2017-04-18 NOTE — Patient Instructions (Signed)
Medication Instructions:   NO CHANGE  Labwork:  Your physician recommends that you return for lab work WHEN FASTING= DO NOT TAKE DIGOXIN PRIOR TO LAB WORK  Follow-Up:  Your physician wants you to follow-up in: [place] will receive a reminder letter in the mail two months in advance. If you don't receive a letter, please call our office to schedule the follow-up appointment.   If you need a refill on your cardiac medications before your next appointment, please call your pharmacy.

## 2017-04-19 DIAGNOSIS — E7800 Pure hypercholesterolemia, unspecified: Secondary | ICD-10-CM | POA: Diagnosis not present

## 2017-04-19 DIAGNOSIS — I480 Paroxysmal atrial fibrillation: Secondary | ICD-10-CM | POA: Diagnosis not present

## 2017-04-19 LAB — BASIC METABOLIC PANEL
BUN / CREAT RATIO: 25 (ref 12–28)
BUN: 20 mg/dL (ref 8–27)
CALCIUM: 9.1 mg/dL (ref 8.7–10.3)
CO2: 27 mmol/L (ref 20–29)
Chloride: 100 mmol/L (ref 96–106)
Creatinine, Ser: 0.81 mg/dL (ref 0.57–1.00)
GFR, EST AFRICAN AMERICAN: 80 mL/min/{1.73_m2} (ref >59)
GFR, EST NON AFRICAN AMERICAN: 69 mL/min/{1.73_m2} (ref >59)
Glucose: 97 mg/dL (ref 65–99)
POTASSIUM: 4.6 mmol/L (ref 3.5–5.2)
SODIUM: 143 mmol/L (ref 134–144)

## 2017-04-19 LAB — LIPID PANEL
CHOL/HDL RATIO: 2.6 ratio (ref 0.0–4.4)
Cholesterol, Total: 121 mg/dL (ref 100–199)
HDL: 46 mg/dL (ref >39)
LDL Calculated: 52 mg/dL (ref 0–99)
Triglycerides: 114 mg/dL (ref 0–149)
VLDL Cholesterol Cal: 23 mg/dL (ref 5–40)

## 2017-04-19 LAB — HEPATIC FUNCTION PANEL
ALT: 17 IU/L (ref 0–32)
AST: 20 IU/L (ref 0–40)
Albumin: 4.3 g/dL (ref 3.5–4.8)
Alkaline Phosphatase: 118 IU/L — ABNORMAL HIGH (ref 39–117)
BILIRUBIN, DIRECT: 0.18 mg/dL (ref 0.00–0.40)
Bilirubin Total: 0.5 mg/dL (ref 0.0–1.2)
TOTAL PROTEIN: 6.4 g/dL (ref 6.0–8.5)

## 2017-04-19 LAB — DIGOXIN LEVEL: Digoxin, Serum: 0.7 ng/mL (ref 0.5–0.9)

## 2017-04-22 ENCOUNTER — Encounter: Payer: Self-pay | Admitting: *Deleted

## 2017-05-13 ENCOUNTER — Ambulatory Visit (INDEPENDENT_AMBULATORY_CARE_PROVIDER_SITE_OTHER)
Admission: RE | Admit: 2017-05-13 | Discharge: 2017-05-13 | Disposition: A | Discharge status: Home / Self Care | Payer: Medicare Other | Source: Ambulatory Visit | Attending: Internal Medicine | Admitting: Internal Medicine

## 2017-05-13 ENCOUNTER — Ambulatory Visit (INDEPENDENT_AMBULATORY_CARE_PROVIDER_SITE_OTHER): Payer: Medicare Other | Admitting: Internal Medicine

## 2017-05-13 ENCOUNTER — Encounter: Payer: Self-pay | Admitting: Internal Medicine

## 2017-05-13 VITALS — BP 104/64 | HR 60 | Temp 97.6°F | Resp 20 | Ht 65.5 in | Wt 158.8 lb

## 2017-05-13 DIAGNOSIS — R05 Cough: Secondary | ICD-10-CM | POA: Diagnosis not present

## 2017-05-13 DIAGNOSIS — Z23 Encounter for immunization: Secondary | ICD-10-CM | POA: Diagnosis not present

## 2017-05-13 DIAGNOSIS — R059 Cough, unspecified: Secondary | ICD-10-CM

## 2017-05-13 DIAGNOSIS — J441 Chronic obstructive pulmonary disease with (acute) exacerbation: Secondary | ICD-10-CM

## 2017-05-13 DIAGNOSIS — R0602 Shortness of breath: Secondary | ICD-10-CM | POA: Diagnosis not present

## 2017-05-13 LAB — POCT EXHALED NITRIC OXIDE: FeNO level (ppb): 22

## 2017-05-13 MED ORDER — METHYLPREDNISOLONE 4 MG PO TBPK: ORAL_TABLET | ORAL | 0 refills | Status: DC

## 2017-05-13 MED ORDER — INDACATEROL-GLYCOPYRROLATE 27.5-15.6 MCG IN CAPS: 1.0000 | ORAL_CAPSULE | Freq: Two times a day (BID) | RESPIRATORY_TRACT | 11 refills | Status: DC

## 2017-05-13 NOTE — Progress Notes (Signed)
Subjective:  Patient ID: [name], female    DOB: [birth year]  Age: 82 y.o. MRN: [id number]  CC: Cough   HPI [name] presents for a 1 week hx of ST, NP cough, wheezing, and SOB. She is not using an inhaler.  Outpatient Medications Prior to Visit  Medication Sig Dispense Refill  . atorvastatin (LIPITOR) 10 MG tablet TAKE ONE TABLET DAILY AT 6PM 30 tablet 2  . digoxin (DIGOX) 0.125 MG tablet Take 1 tablet (0.125 mg total) by mouth daily. KEEP OV. 90 tablet 0  . diltiazem (CARDIZEM CD) 240 MG 24 hr capsule TAKE ONE CAPSULE EACH DAY 90 capsule 1  . latanoprost (XALATAN) 0.005 % ophthalmic solution Place 1 drop into both eyes at bedtime. Reported on 11/09/2015    . pantoprazole (PROTONIX) 40 MG tablet TAKE ONE TABLET EACH DAY 90 tablet 3  . SIMBRINZA 1-0.2 % SUSP Place 1 drop into both eyes 2 (two) times daily.   99  . Indacaterol-Glycopyrrolate (UTIBRON NEOHALER) 27.5-15.6 MCG CAPS Place 1 puff into inhaler and inhale 2 (two) times daily. 60 capsule 11   No facility-administered medications prior to visit.     ROS Review of Systems  Constitutional: Negative.  Negative for chills, diaphoresis, fatigue and fever.  HENT: Positive for sore throat. Negative for facial swelling, sinus pressure, trouble swallowing and voice change.   Eyes: Negative.   Respiratory: Positive for cough, shortness of breath and wheezing. Negative for choking, chest tightness and stridor.   Cardiovascular: Negative.  Negative for chest pain, palpitations and leg swelling.  Gastrointestinal: Negative for abdominal pain, constipation, diarrhea, nausea and vomiting.  Endocrine: Negative.   Genitourinary: Negative.  Negative for difficulty urinating.  Musculoskeletal: Negative.   Skin: Negative.   Allergic/Immunologic: Negative.   Neurological: Negative.  Negative for dizziness, weakness and light-headedness.  Hematological: Negative for adenopathy. Does not bruise/bleed easily.    Psychiatric/Behavioral: Negative.     Objective:  BP 104/64 (BP Location: Left Arm, Patient Position: Sitting, Cuff Size: Normal)   Pulse 60   Temp 97.6 F (36.4 C) (Oral)   Resp 20   Ht 5' 5.5" (1.664 m)   Wt 158 lb 12 oz (72 kg)   LMP 12/03/2002   SpO2 93%   BMI 26.02 kg/m   BP Readings from Last 3 Encounters:  05/13/17 104/64  04/18/17 122/74  11/07/16 112/74    Wt Readings from Last 3 Encounters:  05/13/17 158 lb 12 oz (72 kg)  04/18/17 159 lb (72.1 kg)  11/07/16 159 lb (72.1 kg)    Physical Exam  Constitutional: She is oriented to person, place, and time.  Non-toxic appearance. She does not have a sickly appearance. She does not appear ill. No distress.  HENT:  Mouth/Throat: Oropharynx is clear and moist. No oropharyngeal exudate.  Eyes: Conjunctivae are normal. Right eye exhibits no discharge. Left eye exhibits no discharge. No scleral icterus.  Neck: Normal range of motion. Neck supple. No JVD present. No thyromegaly present.  Cardiovascular: Normal rate and intact distal pulses.  Exam reveals no gallop.   No murmur heard. Pulmonary/Chest: Effort normal. No accessory muscle usage. No tachypnea. No respiratory distress. She has decreased breath sounds in the right upper field, the right middle field, the right lower field, the left upper field, the left middle field and the left lower field. She has no wheezes. She has no rhonchi. She has no rales.  Abdominal: Soft. Bowel sounds are normal. She exhibits no distension and no  mass. There is no tenderness. There is no rebound and no guarding.  Musculoskeletal: Normal range of motion. She exhibits no edema, tenderness or deformity.  Lymphadenopathy:    She has no cervical adenopathy.  Neurological: She is alert and oriented to person, place, and time.  Skin: Skin is warm and dry. No rash noted. She is not diaphoretic. No erythema. No pallor.  Vitals reviewed.   Lab Results  Component Value Date   WBC 6.8 02/23/2015    HGB 13.8 02/23/2015   HCT 43.1 02/23/2015   PLT 283 02/23/2015   GLUCOSE 97 04/19/2017   CHOL 121 04/19/2017   TRIG 114 04/19/2017   HDL 46 04/19/2017   LDLCALC 52 04/19/2017   ALT 17 04/19/2017   AST 20 04/19/2017   NA 143 04/19/2017   K 4.6 04/19/2017   CL 100 04/19/2017   CREATININE 0.81 04/19/2017   BUN 20 04/19/2017   CO2 27 04/19/2017   TSH 1.70 05/09/2016   INR 1.06 02/05/2015   HGBA1C 6.0 05/09/2016    Dg Chest 2 View  Result Date: 09/27/2016 CLINICAL DATA:  Wheezing, dyspnea, cough despite antibiotic treatment 1 month ago. History of COPD, CHF, former smoker. EXAM: CHEST  2 VIEW COMPARISON:  Portable chest x-ray of February 05, 2015 FINDINGS: The lungs are mildly hyperinflated. The interstitial markings are coarse bilaterally. There is no alveolar infiltrate. There is no pleural effusion. The heart is top-normal in size. The pulmonary vascularity is not engorged. There is calcification in the wall of the thoracic aorta. The bony thorax exhibits no acute abnormality. IMPRESSION: COPD with increased interstitial markings consistent with chronic fibrosis-smoking related changes. No alveolar pneumonia nor pulmonary edema. Thoracic aortic atherosclerosis. Electronically Signed   By: David  Martinique M.D.   On: 09/27/2016 16:00    Assessment & Plan:   [name] was seen today for cough.  Diagnoses and all orders for this visit:  COPD exacerbation ([place])- will restart the LABA/LAMA inhaler, I gave her a sample of UTIBRON and showed her how to use it, she demonstrated with its use. Her FeNO score is mildly elevated so will treat with a course of systemic steroids -     Discontinue: Indacaterol-Glycopyrrolate (UTIBRON NEOHALER) 27.5-15.6 MCG CAPS; Place 1 puff into inhaler and inhale 2 (two) times daily. -     Discontinue: methylPREDNISolone (MEDROL DOSEPAK) 4 MG TBPK tablet; TAKE AS DIRECTED -     methylPREDNISolone (MEDROL DOSEPAK) 4 MG TBPK tablet; TAKE AS DIRECTED -      Indacaterol-Glycopyrrolate (UTIBRON NEOHALER) 27.5-15.6 MCG CAPS; Place 1 puff into inhaler and inhale 2 (two) times daily.  Cough- CXR is neg for PNA or mass -     DG Chest 2 View; Future -     POCT EXHALED NITRIC OXIDE  Need for influenza vaccination -     Flu vaccine HIGH DOSE PF (Fluzone High dose)   I am having Ms. Nall maintain her latanoprost, SIMBRINZA, pantoprazole, digoxin, diltiazem, atorvastatin, methylPREDNISolone, and Indacaterol-Glycopyrrolate.  Meds ordered this encounter  Medications  . DISCONTD: Indacaterol-Glycopyrrolate (UTIBRON NEOHALER) 27.5-15.6 MCG CAPS    Sig: Place 1 puff into inhaler and inhale 2 (two) times daily.    Dispense:  60 capsule    Refill:  11  . DISCONTD: methylPREDNISolone (MEDROL DOSEPAK) 4 MG TBPK tablet    Sig: TAKE AS DIRECTED    Dispense:  21 tablet    Refill:  0  . methylPREDNISolone (MEDROL DOSEPAK) 4 MG TBPK tablet    Sig: TAKE  AS DIRECTED    Dispense:  21 tablet    Refill:  0  . Indacaterol-Glycopyrrolate (UTIBRON NEOHALER) 27.5-15.6 MCG CAPS    Sig: Place 1 puff into inhaler and inhale 2 (two) times daily.    Dispense:  60 capsule    Refill:  11     Follow-up: Return in about 3 weeks (around 06/03/2017).  [name], MD

## 2017-05-13 NOTE — Patient Instructions (Signed)
Cough, Adult Coughing is a reflex that clears your throat and your airways. Coughing helps to heal and protect your lungs. It is normal to cough occasionally, but a cough that happens with other symptoms or lasts a long time may be a sign of a condition that needs treatment. A cough may last only 2-3 weeks (acute), or it may last longer than 8 weeks (chronic). What are the causes? Coughing is commonly caused by:  Breathing in substances that irritate your lungs.  A viral or bacterial respiratory infection.  Allergies.  Asthma.  Postnasal drip.  Smoking.  Acid backing up from the stomach into the esophagus (gastroesophageal reflux).  Certain medicines.  Chronic lung problems, including COPD (or rarely, lung cancer).  Other medical conditions such as heart failure.  Follow these instructions at home: Pay attention to any changes in your symptoms. Take these actions to help with your discomfort:  Take medicines only as told by your health care provider. ? If you were prescribed an antibiotic medicine, take it as told by your health care provider. Do not stop taking the antibiotic even if you start to feel better. ? Talk with your health care provider before you take a cough suppressant medicine.  Drink enough fluid to keep your urine clear or pale yellow.  If the air is dry, use a cold steam vaporizer or humidifier in your bedroom or your home to help loosen secretions.  Avoid anything that causes you to cough at work or at home.  If your cough is worse at night, try sleeping in a semi-upright position.  Avoid cigarette smoke. If you smoke, quit smoking. If you need help quitting, ask your health care provider.  Avoid caffeine.  Avoid alcohol.  Rest as needed.  Contact a health care provider if:  You have new symptoms.  You cough up pus.  Your cough does not get better after 2-3 weeks, or your cough gets worse.  You cannot control your cough with suppressant  medicines and you are losing sleep.  You develop pain that is getting worse or pain that is not controlled with pain medicines.  You have a fever.  You have unexplained weight loss.  You have night sweats. Get help right away if:  You cough up blood.  You have difficulty breathing.  Your heartbeat is very fast. This information is not intended to replace advice given to you by your health care provider. Make sure you discuss any questions you have with your health care provider. Document Released: 02/16/2011 Document Revised: 01/26/2016 Document Reviewed: 10/27/2014 Elsevier Interactive Patient Education  2017 [employer]  

## 2017-05-14 ENCOUNTER — Encounter: Payer: Self-pay | Admitting: Internal Medicine

## 2017-05-21 ENCOUNTER — Telehealth: Payer: Self-pay | Admitting: Cardiology

## 2017-05-21 NOTE — Telephone Encounter (Signed)
The patient called in stating that she had called EMS and they were currently at her residence. She had an episode of sweating and nausea. She was able to have a bowel movement and felt better. Per the EMS, her vitals were stable and she felt better. She refused to go to the hospital. She has been advised to go to the hospital if she feels bad again. An appointment has been made for tomorrow at 8 am with [name], PA.

## 2017-05-21 NOTE — Telephone Encounter (Signed)
New message      Pt states that the EMS is at her home now.  She called them because she was sweating all over and felt sick on her stomach.  She now feels better

## 2017-05-22 ENCOUNTER — Ambulatory Visit (INDEPENDENT_AMBULATORY_CARE_PROVIDER_SITE_OTHER): Payer: Medicare Other | Admitting: Physician Assistant

## 2017-05-22 ENCOUNTER — Encounter: Payer: Self-pay | Admitting: Physician Assistant

## 2017-05-22 VITALS — BP 100/70 | HR 107 | Ht 65.0 in | Wt 154.0 lb

## 2017-05-22 DIAGNOSIS — I482 Chronic atrial fibrillation, unspecified: Secondary | ICD-10-CM

## 2017-05-22 DIAGNOSIS — J449 Chronic obstructive pulmonary disease, unspecified: Secondary | ICD-10-CM

## 2017-05-22 DIAGNOSIS — R000 Tachycardia, unspecified: Secondary | ICD-10-CM | POA: Diagnosis not present

## 2017-05-22 DIAGNOSIS — R42 Dizziness and giddiness: Secondary | ICD-10-CM | POA: Diagnosis not present

## 2017-05-22 DIAGNOSIS — I10 Essential (primary) hypertension: Secondary | ICD-10-CM

## 2017-05-22 NOTE — Progress Notes (Signed)
Cardiology Office Note    Date:  05/22/2017   ID:  [name], DOB 07-Aug-1935, MRN [id number]  PCP:  Janith Lima, MD  Cardiologist:  Dr. Stanford Breed  Chief Complaint  Patient presents with  . Follow-up    seen for Dr. Stanford Breed, had episode of presyncope yesterday    History of Present Illness:  [name] is a 82 y.o. female with PMH of COPD, GERD, chronic atrial fibrillation, HTN, and recurrent bladder cancer s/p chemo. Last echocardiogram obtained on 02/07/2015 showed EF 65-70%, mild LVH, grade 1 DD. She was admitted with large right cerebellar hemorrhage in June 2016. Her only anticoagulation medication at that time was aspirin. She developed atrial fibrillation during the hospitalization with difficulty to control heart heart rate. She was felt not to be a candidate for anticoagulation due to the intracranial hemorrhage. Holter monitor in July 2016 showed atrial fibrillation with elevated heart rate, digoxin was added. Dr. Stanford Breed discussed with Dr Kathyrn Sheriff Neurosurgery regarding whether she could be a candidate for short-term anticoagulation which would qualify her for a watchman device, it was felt this could be pursued. However the patient declined and understand the high risk of stroke.   Yesterday, she called EMS due to episode of sweating and nausea. EMS arrived and she refused to go to the hospital. She presents today for cardiology office visit. According to patient, she was standing when she suddenly feel the voices outside became very distant and she felt like she was going to pass out. She was diaphoretic. The symptom quickly resolved after her children checked on her. Blood pressure obtained by EMS showed systolic blood pressure in the high 90s. She denies any chest pain or shortness of breath prior to or after the episode. She has never experienced any symptoms similar to the episode. Most likely cause would be a vasovagal episode where her blood pressure dropped  spontaneously. She just recently finished a course of azithromycin prescribed by Dr. Ronnald Ramp for COPD exacerbation. She also finished a course of steroid as well. She says she likely will require another regiment of steroid therapy and antibiotic. She has taken Augmentin in January with good effect. She will need to discuss this with her primary care doctor. Given the isolated incident, I do not recommend a heart monitor at this time. She does not have any cardiac awareness and denies any significant palpitations during the episode. Even though her heart rate is borderline today, however most times it stays below 100 bpm. I am unable to adjust her medication, she is currently on digoxin 0.125 mg and also on diltiazem CD 240 mg daily. There is currently no evidence of prolonged pause to cause her symptom. She always have T wave inversion in the lateral leads on EKG, this has not changed compared to 2016 or the last EKG.    Past Medical History:  Diagnosis Date  . Arthritis   . COPD (chronic obstructive pulmonary disease) ([place])   . Dyspnea on exertion   . GERD (gastroesophageal reflux disease)   . Glaucoma of both eyes   . History of atrial fibrillation without current medication 2010   POST SURG 2010--  PER PT NO ISSUES SINCE  . History of breast cancer    DX DCIS IN 2004--  S/P RIGHT MASTECTOMY , NO CHEMORADIATION---  NO RECURRENCE  . History of CHF (congestive heart failure)    POST SURG 2010  . History of hypertension   . History of shingles  02/ 2015--  back of neck and left flank--  no residual pain  . ICH (intracerebral hemorrhage) ([place]) 02/05/2015  . Nephrolithiasis   . Recurrent bladder papillary carcinoma ([place]) first dx 06/ 2014   s/p  turbt's  and instillation mitomycin c (chemo)//dx again in 2015-different type    Past Surgical History:  Procedure Laterality Date  . CATARACT EXTRACTION W/ INTRAOCULAR LENS  IMPLANT, BILATERAL    . CRANIECTOMY N/A 02/05/2015   Procedure:  SUBOCCIPITAL CRANIECTOMY EVACUATION OF HEMATOMA;  Surgeon: Consuella Lose, MD;  Location: [facility];  Service: Neurosurgery;  Laterality: N/A;  . CYSTOSCOPY W/ RETROGRADES Bilateral 08/11/2014   Procedure: CYSTOSCOPY WITH BILATERAL RETROGRADE PYELOGRAM AND MITOMYCIN INSTILLATION;  Surgeon: Alexis Frock, MD;  Location: [facility];  Service: Urology;  Laterality: Bilateral;  . CYSTOSCOPY W/ URETERAL STENT PLACEMENT Bilateral 03/26/2013   Procedure: CYSTOSCOPY WITH BILATERAL RETROGRADE PYELOGRAM/URETERAL STENT PLACEMENT;  Surgeon: Alexis Frock, MD;  Location[facility];  Service: Urology;  Laterality: Bilateral;  . CYSTOSCOPY W/ URETERAL STENT PLACEMENT Left 05/13/2013   Procedure: CYSTOSCOPY WITH RETROGRADE PYELOGRAM/URETERAL STENT PLACEMENT STENT EXCHANGE;  Surgeon: Alexis Frock, MD;  Location: [facility];  Service: Urology;  Laterality: Left;  . PARTIAL MASTECTOMY WITH NEEDLE LOCALIZATION Right 01-14-2003   DCIS  . REMOVAL CYST LEFT HAND  2013  . TOTAL KNEE ARTHROPLASTY Right 03-22-2009  . TOTAL MASTECTOMY Right 02-03-2003   W/ SLN BX  AND  POST 03-01-2003 EVACUATION HEMATOMA  . TRANSTHORACIC ECHOCARDIOGRAM  03-25-2009   MILD LVF/ EF 70-80%/ MILD INCREASE SYSTOLIC PULMONARY  PRESSURE  . TRANSURETHRAL RESECTION OF BLADDER TUMOR WITH GYRUS (TURBT-GYRUS) N/A 03/26/2013   Procedure: TRANSURETHRAL RESECTION OF BLADDER TUMOR WITH GYRUS (TURBT-GYRUS);  Surgeon: Alexis Frock, MD;  Location: [facility];  Service: Urology;  Laterality: N/A;  . TRANSURETHRAL RESECTION OF BLADDER TUMOR WITH GYRUS (TURBT-GYRUS) N/A 05/13/2013   Procedure: TRANSURETHRAL RESECTION OF BLADDER TUMOR WITH GYRUS (TURBT-GYRUS)  RE-STAGING TRANSURETHRAL RESECTION OF BLADDER TUMOR, LEFT RETROGRADE PYELOGRAM AND STENT EXCHANGE;  Surgeon: Alexis Frock, MD;  Location: [facility];  Service: Urology;  Laterality: N/A;  . TRANSURETHRAL RESECTION OF  BLADDER TUMOR WITH GYRUS (TURBT-GYRUS) N/A 08/11/2014   Procedure: TRANSURETHRAL RESECTION OF BLADDER TUMOR WITH GYRUS (TURBT-GYRUS);  Surgeon: Alexis Frock, MD;  Location: [facility];  Service: Urology;  Laterality: N/A;  . TUBAL LIGATION      Current Medications: Outpatient Medications Prior to Visit  Medication Sig Dispense Refill  . atorvastatin (LIPITOR) 10 MG tablet TAKE ONE TABLET DAILY AT 6PM 30 tablet 2  . digoxin (DIGOX) 0.125 MG tablet Take 1 tablet (0.125 mg total) by mouth daily. KEEP OV. 90 tablet 0  . diltiazem (CARDIZEM CD) 240 MG 24 hr capsule TAKE ONE CAPSULE EACH DAY 90 capsule 1  . latanoprost (XALATAN) 0.005 % ophthalmic solution Place 1 drop into both eyes at bedtime. Reported on 11/09/2015    . pantoprazole (PROTONIX) 40 MG tablet TAKE ONE TABLET EACH DAY 90 tablet 3  . SIMBRINZA 1-0.2 % SUSP Place 1 drop into both eyes 2 (two) times daily.   99  . Indacaterol-Glycopyrrolate (UTIBRON NEOHALER) 27.5-15.6 MCG CAPS Place 1 puff into inhaler and inhale 2 (two) times daily. (Patient not taking: Reported on 05/22/2017) 60 capsule 11  . methylPREDNISolone (MEDROL DOSEPAK) 4 MG TBPK tablet TAKE AS DIRECTED (Patient not taking: Reported on 05/22/2017) 21 tablet 0   No facility-administered medications prior to visit.  Allergies:   Morphine and related   Social History   Social History  . Marital status: Married    Spouse name: N/A  . Number of children: N/A  . Years of education: N/A   Social History Main Topics  . Smoking status: Former Smoker    Packs/day: 1.00    Years: 40.00    Types: Cigarettes    Quit date: 03/20/2003  . Smokeless tobacco: Never Used  . Alcohol use No  . Drug use: No  . Sexual activity: Yes    Partners: Male    Birth control/ protection: Surgical     Comment: BTL   Other Topics Concern  . None   Social History Narrative  . None     Family History:  The patient's family history includes Breast cancer (age of onset:  1) in her maternal aunt; Breast cancer (age of onset: 27) in her daughter; Breast cancer (age of onset: 46) in her other; Cancer in her maternal aunt; Colon cancer in her maternal grandmother; Colon cancer (age of onset: 44) in her mother; Stroke in her maternal grandmother and paternal grandmother.   ROS:   Please see the history of present illness.    ROS All other systems reviewed and are negative.   PHYSICAL EXAM:   VS:  BP 100/70 (BP Location: Left Arm, Patient Position: Sitting)   Pulse (!) 107   Ht 5' 5" (1.651 m)   Wt 154 lb (69.9 kg)   LMP 12/03/2002   BMI 25.63 kg/m    GEN: Well nourished, well developed, in no acute distress  HEENT: normal  Neck: no JVD, carotid bruits, or masses Cardiac: irregularly irregular; no murmurs, rubs, or gallops,no edema  Respiratory:  clear to auscultation bilaterally, normal work of breathing GI: soft, nontender, nondistended, + BS MS: no deformity or atrophy  Skin: warm and dry, no rash Neuro:  Alert and Oriented x 3, Strength and sensation are intact Psych: euthymic mood, full affect  Wt Readings from Last 3 Encounters:  05/22/17 154 lb (69.9 kg)  05/13/17 158 lb 12 oz (72 kg)  04/18/17 159 lb (72.1 kg)      Studies/Labs Reviewed:   EKG:  EKG is ordered today.  The ekg ordered today demonstrates Atrial fibrillation, persistent ST depression in lateral leads unchanged compared to previous EKG.  Recent Labs: 04/19/2017: ALT 17; BUN 20; Creatinine, Ser 0.81; Potassium 4.6; Sodium 143   Lipid Panel    Component Value Date/Time   CHOL 121 04/19/2017 0936   TRIG 114 04/19/2017 0936   HDL 46 04/19/2017 0936   CHOLHDL 2.6 04/19/2017 0936   CHOLHDL 3 05/09/2016 1216   VLDL 36.8 05/09/2016 1216   LDLCALC 52 04/19/2017 0936    Additional studies/ records that were reviewed today include:   Echo 02/07/2015 LV EF: 65% -   70%   Study Conclusions  - Procedure narrative: Transthoracic echocardiography. Image   quality was poor.  The study was technically difficult, as a   result of poor acoustic windows, poor sound wave transmission,   and restricted patient mobility. - Left ventricle: The cavity size was normal. Wall thickness was   increased in a pattern of mild LVH. Systolic function was   vigorous. The estimated ejection fraction was in the range of 65%   to 70%. Wall motion was normal; there were no regional wall   motion abnormalities. Doppler parameters are consistent with   abnormal left ventricular relaxation (grade 1 diastolic  dysfunction). The E/e&' ratio is between 8-15, suggesting   indeterminate LV filling pressure. - Aortic valve: Poorly visualized. There was no stenosis. There was   no regurgitation. - Mitral valve: Calcified annulus. There was trivial regurgitation. - Left atrium: The atrium was normal in size. - Right atrium: The atrium was mildly dilated.  Impressions:  - Although difficult to compare, there do not appear to be   significant changes compared to the echo in 2010.   ASSESSMENT:    1. Dizziness   2. Chronic atrial fibrillation (HCC)   3. Chronic obstructive pulmonary disease, unspecified COPD type ([place])   4. Essential hypertension      PLAN:  In order of problems listed above:  1. Presyncope: Single isolated episode where she felt severely diaphoretic and presyncopal. Systolic blood pressure in the 90s by the time EMS arrived. She likely had a vasovagal episode. She denied any chest discomfort or shortness of breath before or afterward. At this time, unless symptoms recur again, I do not recommend a heart monitor.  2. Chronic atrial fibrillation: Heart rate barely controlled, on digoxin and diltiazem CD 240 mg daily daily. Unable to up titrate medication due to soft blood pressure. Not a candidate for systemic anticoagulation due to history of spontaneous intracranial bleed.  3. Hypertension: Blood pressure soft on current diltiazem     Medication  Adjustments/Labs and Tests Ordered: Current medicines are reviewed at length with the patient today.  Concerns regarding medicines are outlined above.  Medication changes, Labs and Tests ordered today are listed in the Patient Instructions below. Patient Instructions  Medication Instructions: Your physician recommends that you continue on your current medications as directed. Please refer to the Current Medication list given to you today.  If you need a refill on your cardiac medications before your next appointment, please call your pharmacy.    Follow-Up: Your physician wants you to follow-up in: 3 month with Dr. Stanford Breed. You will receive a reminder letter in the mail two months in advance. If you don't receive a letter, please call our office to schedule this follow-up appointment.   Special Instructions:  Please follow up with your PCP.   Thank you for choosing [facility] at [facility], [name], [place]  05/22/2017 9:15 PM    [facility] [place], [place],   [address] Phone: [phone]; Fax: [phone]

## 2017-05-22 NOTE — Patient Instructions (Signed)
Medication Instructions: Your physician recommends that you continue on your current medications as directed. Please refer to the Current Medication list given to you today.  If you need a refill on your cardiac medications before your next appointment, please call your pharmacy.    Follow-Up: Your physician wants you to follow-up in: 3 month with Dr. Stanford Breed. You will receive a reminder letter in the mail two months in advance. If you don't receive a letter, please call our office to schedule this follow-up appointment.   Special Instructions:  Please follow up with your PCP.   Thank you for choosing Heartcare at [facility]!!

## 2017-06-06 DIAGNOSIS — C670 Malignant neoplasm of trigone of bladder: Secondary | ICD-10-CM | POA: Diagnosis not present

## 2017-06-06 DIAGNOSIS — N200 Calculus of kidney: Secondary | ICD-10-CM | POA: Diagnosis not present

## 2017-06-17 ENCOUNTER — Encounter: Payer: Self-pay | Admitting: Internal Medicine

## 2017-06-17 ENCOUNTER — Ambulatory Visit (INDEPENDENT_AMBULATORY_CARE_PROVIDER_SITE_OTHER): Payer: Medicare Other | Admitting: Internal Medicine

## 2017-06-17 VITALS — BP 110/60 | HR 89 | Temp 97.8°F | Resp 16 | Ht 65.5 in | Wt 156.5 lb

## 2017-06-17 DIAGNOSIS — B373 Candidiasis of vulva and vagina: Secondary | ICD-10-CM

## 2017-06-17 DIAGNOSIS — J449 Chronic obstructive pulmonary disease, unspecified: Secondary | ICD-10-CM

## 2017-06-17 DIAGNOSIS — B3731 Acute candidiasis of vulva and vagina: Secondary | ICD-10-CM

## 2017-06-17 DIAGNOSIS — I10 Essential (primary) hypertension: Secondary | ICD-10-CM | POA: Diagnosis not present

## 2017-06-17 DIAGNOSIS — Z23 Encounter for immunization: Secondary | ICD-10-CM | POA: Diagnosis not present

## 2017-06-17 MED ORDER — TIOTROPIUM BROMIDE MONOHYDRATE 18 MCG IN CAPS: 18.0000 ug | ORAL_CAPSULE | Freq: Every day | RESPIRATORY_TRACT | 11 refills | Status: DC

## 2017-06-17 MED ORDER — FLUCONAZOLE 150 MG PO TABS: 150.0000 mg | ORAL_TABLET | Freq: Once | ORAL | 0 refills | Status: AC

## 2017-06-17 NOTE — Progress Notes (Signed)
Subjective:  Patient ID: [name], female    DOB: [birth year]  Age: 82 y.o. MRN: [id number]  CC: Hypertension and COPD   HPI [name] presents for f/up - She complains that her current inhaler, UTIBRON , is too expensive and she wants to try different one. She has rare cough that's productive of clear phlegm and some episodes of shortness of breath but she denies chest pain or wheezing. She complains of vaginal itching and thinks she may have a yeast infection.  Outpatient Medications Prior to Visit  Medication Sig Dispense Refill  . atorvastatin (LIPITOR) 10 MG tablet TAKE ONE TABLET DAILY AT 6PM 30 tablet 2  . digoxin (DIGOX) 0.125 MG tablet Take 1 tablet (0.125 mg total) by mouth daily. KEEP OV. 90 tablet 0  . diltiazem (CARDIZEM CD) 240 MG 24 hr capsule TAKE ONE CAPSULE EACH DAY 90 capsule 1  . latanoprost (XALATAN) 0.005 % ophthalmic solution Place 1 drop into both eyes at bedtime. Reported on 11/09/2015    . pantoprazole (PROTONIX) 40 MG tablet TAKE ONE TABLET EACH DAY 90 tablet 3  . SIMBRINZA 1-0.2 % SUSP Place 1 drop into both eyes 2 (two) times daily.   99   No facility-administered medications prior to visit.     ROS Review of Systems  Constitutional: Negative.  Negative for chills, fatigue and fever.  HENT: Negative.  Negative for trouble swallowing.   Eyes: Negative.   Respiratory: Positive for cough and shortness of breath. Negative for chest tightness and wheezing.   Cardiovascular: Negative for chest pain, palpitations and leg swelling.  Gastrointestinal: Negative.  Negative for abdominal pain, constipation, diarrhea, nausea and vomiting.  Endocrine: Negative.   Genitourinary: Positive for vaginal discharge. Negative for decreased urine volume, dysuria, flank pain, frequency, genital sores, hematuria, menstrual problem, pelvic pain, urgency, vaginal bleeding and vaginal pain.  Musculoskeletal: Negative.   Skin: Negative.   Allergic/Immunologic:  Negative.   Neurological: Negative.  Negative for dizziness, weakness and numbness.  Hematological: Negative for adenopathy. Does not bruise/bleed easily.  Psychiatric/Behavioral: Negative.     Objective:  BP 110/60 (BP Location: Left Arm, Patient Position: Sitting, Cuff Size: Normal)   Pulse 89   Temp 97.8 F (36.6 C) (Oral)   Resp 16   Ht 5' 5.5" (1.664 m)   Wt 156 lb 8 oz (71 kg)   LMP 12/03/2002   SpO2 93%   BMI 25.65 kg/m   BP Readings from Last 3 Encounters:  06/17/17 110/60  05/22/17 100/70  05/13/17 104/64    Wt Readings from Last 3 Encounters:  06/17/17 156 lb 8 oz (71 kg)  05/22/17 154 lb (69.9 kg)  05/13/17 158 lb 12 oz (72 kg)    Physical Exam  Constitutional: She is oriented to person, place, and time. No distress.  HENT:  Mouth/Throat: Oropharynx is clear and moist. No oropharyngeal exudate.  Eyes: Conjunctivae are normal. Right eye exhibits no discharge. Left eye exhibits no discharge. No scleral icterus.  Neck: Normal range of motion. Neck supple. No thyromegaly present.  Cardiovascular: Normal rate, regular rhythm and intact distal pulses.  Exam reveals no gallop and no friction rub.   No murmur heard. Pulmonary/Chest: Effort normal and breath sounds normal. No respiratory distress. She has no wheezes. She has no rales. She exhibits no tenderness.  Abdominal: Soft. Bowel sounds are normal. She exhibits no distension and no mass. There is no tenderness. There is no rebound and no guarding.  Musculoskeletal: Normal range of  motion. She exhibits no edema or tenderness.  Lymphadenopathy:    She has no cervical adenopathy.  Neurological: She is alert and oriented to person, place, and time.  Skin: Skin is warm and dry. No rash noted. She is not diaphoretic. No erythema. No pallor.  Vitals reviewed.   Lab Results  Component Value Date   WBC 6.8 02/23/2015   HGB 13.8 02/23/2015   HCT 43.1 02/23/2015   PLT 283 02/23/2015   GLUCOSE 97 04/19/2017   CHOL  121 04/19/2017   TRIG 114 04/19/2017   HDL 46 04/19/2017   LDLCALC 52 04/19/2017   ALT 17 04/19/2017   AST 20 04/19/2017   NA 143 04/19/2017   K 4.6 04/19/2017   CL 100 04/19/2017   CREATININE 0.81 04/19/2017   BUN 20 04/19/2017   CO2 27 04/19/2017   TSH 1.70 05/09/2016   INR 1.06 02/05/2015   HGBA1C 6.0 05/09/2016    Dg Chest 2 View  Result Date: 05/13/2017 CLINICAL DATA:  82 y/o F; cough, shortness of breath, and sore throat. History of COPD and smoking. EXAM: CHEST  2 VIEW COMPARISON:  09/27/2016 chest radiograph. FINDINGS: Normal cardiac silhouette. Aortic atherosclerosis with calcification. Stable coarse interstitial markings compatible with COPD with upper lung zone emphysema and lung base chronic bronchitic changes. No focal consolidation. No pulmonary edema. Chronic blunting of the costal diaphragmatic angles. No pneumothorax. Multilevel degenerative changes of thoracic spine. No loss of vertebral body height. IMPRESSION: Stable chronic findings of COPD. No acute pulmonary process identified. Electronically Signed   By: Kristine Garbe M.D.   On: 05/13/2017 17:17    Assessment & Plan:   [name] was seen today for hypertension and copd.  Diagnoses and all orders for this visit:  Chronic obstructive pulmonary disease, unspecified COPD type ([place])- will treat with a less expensive LAMA -     tiotropium (SPIRIVA HANDIHALER) 18 MCG inhalation capsule; Place 1 capsule (18 mcg total) into inhaler and inhale daily.  Benign essential HTN- her BP is well controlled  Need for pneumococcal vaccination -     Pneumococcal polysaccharide vaccine 23-valent greater than or equal to 2yo subcutaneous/IM  Yeast vaginitis -     fluconazole (DIFLUCAN) 150 MG tablet; Take 1 tablet (150 mg total) by mouth once.   I am having [name] start on tiotropium and fluconazole. I am also having her maintain her latanoprost, SIMBRINZA, pantoprazole, digoxin, diltiazem, and  atorvastatin.  Meds ordered this encounter  Medications  . tiotropium (SPIRIVA HANDIHALER) 18 MCG inhalation capsule    Sig: Place 1 capsule (18 mcg total) into inhaler and inhale daily.    Dispense:  30 capsule    Refill:  11  . fluconazole (DIFLUCAN) 150 MG tablet    Sig: Take 1 tablet (150 mg total) by mouth once.    Dispense:  1 tablet    Refill:  0     Follow-up: Return in about 6 months (around 12/16/2017).  [name], MD

## 2017-06-17 NOTE — Patient Instructions (Signed)

## 2017-06-25 ENCOUNTER — Other Ambulatory Visit: Payer: Self-pay | Admitting: Cardiology

## 2017-06-25 NOTE — Telephone Encounter (Signed)
REFILL

## 2017-07-16 ENCOUNTER — Other Ambulatory Visit: Payer: Self-pay | Admitting: Cardiology

## 2017-07-16 NOTE — Telephone Encounter (Signed)
REFILL 

## 2017-07-30 DIAGNOSIS — H04121 Dry eye syndrome of right lacrimal gland: Secondary | ICD-10-CM | POA: Diagnosis not present

## 2017-08-19 DIAGNOSIS — Z853 Personal history of malignant neoplasm of breast: Secondary | ICD-10-CM | POA: Diagnosis not present

## 2017-08-19 DIAGNOSIS — Z1231 Encounter for screening mammogram for malignant neoplasm of breast: Secondary | ICD-10-CM | POA: Diagnosis not present

## 2017-08-19 LAB — HM MAMMOGRAPHY

## 2017-08-22 ENCOUNTER — Encounter: Payer: Self-pay | Admitting: Internal Medicine

## 2017-09-11 DIAGNOSIS — H0100B Unspecified blepharitis left eye, upper and lower eyelids: Secondary | ICD-10-CM | POA: Diagnosis not present

## 2017-09-11 DIAGNOSIS — H401134 Primary open-angle glaucoma, bilateral, indeterminate stage: Secondary | ICD-10-CM | POA: Diagnosis not present

## 2017-09-11 DIAGNOSIS — H0100A Unspecified blepharitis right eye, upper and lower eyelids: Secondary | ICD-10-CM | POA: Diagnosis not present

## 2017-09-25 DIAGNOSIS — H16041 Marginal corneal ulcer, right eye: Secondary | ICD-10-CM | POA: Diagnosis not present

## 2017-09-25 DIAGNOSIS — H0100B Unspecified blepharitis left eye, upper and lower eyelids: Secondary | ICD-10-CM | POA: Diagnosis not present

## 2017-09-25 DIAGNOSIS — H0100A Unspecified blepharitis right eye, upper and lower eyelids: Secondary | ICD-10-CM | POA: Diagnosis not present

## 2017-10-08 ENCOUNTER — Other Ambulatory Visit: Payer: Self-pay | Admitting: Cardiology

## 2017-10-08 NOTE — Telephone Encounter (Signed)
REFILL 

## 2017-11-12 ENCOUNTER — Other Ambulatory Visit: Payer: Self-pay | Admitting: Internal Medicine

## 2017-12-20 ENCOUNTER — Other Ambulatory Visit: Payer: Self-pay | Admitting: *Deleted

## 2017-12-20 MED ORDER — ATORVASTATIN CALCIUM 10 MG PO TABS: 10.0000 mg | ORAL_TABLET | Freq: Every day | ORAL | 1 refills | Status: DC

## 2017-12-26 ENCOUNTER — Ambulatory Visit: Payer: Self-pay

## 2017-12-26 ENCOUNTER — Encounter (HOSPITAL_COMMUNITY): Payer: Self-pay

## 2017-12-26 ENCOUNTER — Emergency Department (HOSPITAL_COMMUNITY): Payer: Medicare Other

## 2017-12-26 ENCOUNTER — Inpatient Hospital Stay (HOSPITAL_COMMUNITY)
Admission: EM | Admit: 2017-12-26 | Discharge: 2017-12-28 | DRG: 190 | Disposition: A | Discharge status: Home / Self Care | Payer: Medicare Other | Attending: Internal Medicine | Admitting: Internal Medicine

## 2017-12-26 ENCOUNTER — Other Ambulatory Visit: Payer: Self-pay

## 2017-12-26 DIAGNOSIS — I5032 Chronic diastolic (congestive) heart failure: Secondary | ICD-10-CM | POA: Diagnosis present

## 2017-12-26 DIAGNOSIS — J441 Chronic obstructive pulmonary disease with (acute) exacerbation: Secondary | ICD-10-CM | POA: Diagnosis not present

## 2017-12-26 DIAGNOSIS — K219 Gastro-esophageal reflux disease without esophagitis: Secondary | ICD-10-CM | POA: Diagnosis not present

## 2017-12-26 DIAGNOSIS — Z8673 Personal history of transient ischemic attack (TIA), and cerebral infarction without residual deficits: Secondary | ICD-10-CM

## 2017-12-26 DIAGNOSIS — I509 Heart failure, unspecified: Secondary | ICD-10-CM

## 2017-12-26 DIAGNOSIS — I482 Chronic atrial fibrillation, unspecified: Secondary | ICD-10-CM | POA: Diagnosis present

## 2017-12-26 DIAGNOSIS — Z8679 Personal history of other diseases of the circulatory system: Secondary | ICD-10-CM | POA: Diagnosis not present

## 2017-12-26 DIAGNOSIS — Z79899 Other long term (current) drug therapy: Secondary | ICD-10-CM | POA: Diagnosis not present

## 2017-12-26 DIAGNOSIS — Z9011 Acquired absence of right breast and nipple: Secondary | ICD-10-CM | POA: Diagnosis not present

## 2017-12-26 DIAGNOSIS — E785 Hyperlipidemia, unspecified: Secondary | ICD-10-CM | POA: Diagnosis not present

## 2017-12-26 DIAGNOSIS — Z8551 Personal history of malignant neoplasm of bladder: Secondary | ICD-10-CM | POA: Diagnosis not present

## 2017-12-26 DIAGNOSIS — I480 Paroxysmal atrial fibrillation: Secondary | ICD-10-CM | POA: Diagnosis present

## 2017-12-26 DIAGNOSIS — I110 Hypertensive heart disease with heart failure: Secondary | ICD-10-CM | POA: Diagnosis present

## 2017-12-26 DIAGNOSIS — R0602 Shortness of breath: Secondary | ICD-10-CM | POA: Diagnosis not present

## 2017-12-26 DIAGNOSIS — Z853 Personal history of malignant neoplasm of breast: Secondary | ICD-10-CM | POA: Diagnosis not present

## 2017-12-26 DIAGNOSIS — Z7951 Long term (current) use of inhaled steroids: Secondary | ICD-10-CM | POA: Diagnosis not present

## 2017-12-26 DIAGNOSIS — Z87891 Personal history of nicotine dependence: Secondary | ICD-10-CM

## 2017-12-26 DIAGNOSIS — H409 Unspecified glaucoma: Secondary | ICD-10-CM | POA: Diagnosis not present

## 2017-12-26 DIAGNOSIS — J9601 Acute respiratory failure with hypoxia: Secondary | ICD-10-CM | POA: Diagnosis present

## 2017-12-26 DIAGNOSIS — Z96651 Presence of right artificial knee joint: Secondary | ICD-10-CM | POA: Diagnosis present

## 2017-12-26 DIAGNOSIS — R918 Other nonspecific abnormal finding of lung field: Secondary | ICD-10-CM | POA: Diagnosis not present

## 2017-12-26 DIAGNOSIS — R05 Cough: Secondary | ICD-10-CM | POA: Diagnosis not present

## 2017-12-26 LAB — BASIC METABOLIC PANEL
Anion gap: 13 (ref 5–15)
BUN: 14 mg/dL (ref 6–20)
CALCIUM: 8.8 mg/dL — AB (ref 8.9–10.3)
CO2: 25 mmol/L (ref 22–32)
CREATININE: 0.79 mg/dL (ref 0.44–1.00)
Chloride: 102 mmol/L (ref 101–111)
GFR calc non Af Amer: >60 mL/min (ref >60)
GLUCOSE: 123 mg/dL — AB (ref 65–99)
Potassium: 3.9 mmol/L (ref 3.5–5.1)
Sodium: 140 mmol/L (ref 135–145)

## 2017-12-26 LAB — CBC
HEMATOCRIT: 43.4 % (ref 36.0–46.0)
Hemoglobin: 13.8 g/dL (ref 12.0–15.0)
MCH: 30.3 pg (ref 26.0–34.0)
MCHC: 31.8 g/dL (ref 30.0–36.0)
MCV: 95.4 fL (ref 78.0–100.0)
Platelets: 236 10*3/uL (ref 150–400)
RBC: 4.55 MIL/uL (ref 3.87–5.11)
RDW: 15 % (ref 11.5–15.5)
WBC: 8.1 10*3/uL (ref 4.0–10.5)

## 2017-12-26 LAB — I-STAT TROPONIN, ED: TROPONIN I, POC: 0.02 ng/mL (ref 0.00–0.08)

## 2017-12-26 LAB — DIGOXIN LEVEL: Digoxin Level: 1.1 ng/mL (ref 0.8–2.0)

## 2017-12-26 LAB — BRAIN NATRIURETIC PEPTIDE: B Natriuretic Peptide: 409.8 pg/mL — ABNORMAL HIGH (ref 0.0–100.0)

## 2017-12-26 MED ORDER — ALBUTEROL SULFATE (2.5 MG/3ML) 0.083% IN NEBU
5.0000 mg | INHALATION_SOLUTION | Freq: Once | RESPIRATORY_TRACT | Status: AC
  Administered 2017-12-26: 5 mg via RESPIRATORY_TRACT
  Filled 2017-12-26: qty 6

## 2017-12-26 MED ORDER — IPRATROPIUM BROMIDE 0.02 % IN SOLN
0.5000 mg | Freq: Once | RESPIRATORY_TRACT | Status: AC
  Administered 2017-12-26: 0.5 mg via RESPIRATORY_TRACT
  Filled 2017-12-26: qty 2.5

## 2017-12-26 MED ORDER — DIGOXIN 125 MCG PO TABS
125.0000 ug | ORAL_TABLET | Freq: Every day | ORAL | Status: DC
  Administered 2017-12-27: 125 ug via ORAL
  Filled 2017-12-26 (×2): qty 1

## 2017-12-26 MED ORDER — AZITHROMYCIN 500 MG IV SOLR
500.0000 mg | INTRAVENOUS | Status: DC
  Administered 2017-12-27 (×2): 500 mg via INTRAVENOUS
  Filled 2017-12-26 (×3): qty 500

## 2017-12-26 MED ORDER — METHYLPREDNISOLONE SODIUM SUCC 125 MG IJ SOLR
125.0000 mg | Freq: Once | INTRAMUSCULAR | Status: AC
  Administered 2017-12-26: 125 mg via INTRAVENOUS
  Filled 2017-12-26: qty 2

## 2017-12-26 MED ORDER — ONDANSETRON HCL 4 MG PO TABS: 4.0000 mg | ORAL_TABLET | Freq: Four times a day (QID) | ORAL | Status: DC | PRN

## 2017-12-26 MED ORDER — ACETAMINOPHEN 325 MG PO TABS: 650.0000 mg | ORAL_TABLET | Freq: Four times a day (QID) | ORAL | Status: DC | PRN

## 2017-12-26 MED ORDER — BRIMONIDINE TARTRATE 0.2 % OP SOLN
1.0000 [drp] | Freq: Three times a day (TID) | OPHTHALMIC | Status: DC
  Filled 2017-12-26: qty 5

## 2017-12-26 MED ORDER — IPRATROPIUM-ALBUTEROL 0.5-2.5 (3) MG/3ML IN SOLN: 3.0000 mL | RESPIRATORY_TRACT | Status: DC | PRN

## 2017-12-26 MED ORDER — IPRATROPIUM-ALBUTEROL 0.5-2.5 (3) MG/3ML IN SOLN
3.0000 mL | Freq: Four times a day (QID) | RESPIRATORY_TRACT | Status: DC
  Administered 2017-12-27 (×2): 3 mL via RESPIRATORY_TRACT
  Filled 2017-12-26 (×2): qty 3

## 2017-12-26 MED ORDER — BRINZOLAMIDE 1 % OP SUSP
1.0000 [drp] | Freq: Three times a day (TID) | OPHTHALMIC | Status: DC
Start: 2017-12-26 — End: 2017-12-28
  Administered 2017-12-27: 1 [drp] via OPHTHALMIC
  Filled 2017-12-26: qty 10

## 2017-12-26 MED ORDER — GUAIFENESIN ER 600 MG PO TB12
600.0000 mg | ORAL_TABLET | Freq: Two times a day (BID) | ORAL | Status: DC
Start: 2017-12-26 — End: 2017-12-28
  Administered 2017-12-27 – 2017-12-28 (×3): 600 mg via ORAL
  Filled 2017-12-26 (×3): qty 1

## 2017-12-26 MED ORDER — ATORVASTATIN CALCIUM 10 MG PO TABS
10.0000 mg | ORAL_TABLET | Freq: Every day | ORAL | Status: DC
  Administered 2017-12-27: 10 mg via ORAL
  Filled 2017-12-26: qty 1

## 2017-12-26 MED ORDER — PREDNISONE 20 MG PO TABS: 60.0000 mg | ORAL_TABLET | Freq: Once | ORAL | Status: DC

## 2017-12-26 MED ORDER — LATANOPROST 0.005 % OP SOLN
1.0000 [drp] | Freq: Every day | OPHTHALMIC | Status: DC
  Administered 2017-12-27 (×2): 1 [drp] via OPHTHALMIC
  Filled 2017-12-26: qty 2.5

## 2017-12-26 MED ORDER — DILTIAZEM HCL ER COATED BEADS 120 MG PO CP24
240.0000 mg | ORAL_CAPSULE | Freq: Every day | ORAL | Status: DC
  Administered 2017-12-27 – 2017-12-28 (×2): 240 mg via ORAL
  Filled 2017-12-26 (×2): qty 2

## 2017-12-26 MED ORDER — ACETAMINOPHEN 650 MG RE SUPP: 650.0000 mg | Freq: Four times a day (QID) | RECTAL | Status: DC | PRN

## 2017-12-26 MED ORDER — ONDANSETRON HCL 4 MG/2ML IJ SOLN: 4.0000 mg | Freq: Four times a day (QID) | INTRAMUSCULAR | Status: DC | PRN

## 2017-12-26 MED ORDER — PANTOPRAZOLE SODIUM 40 MG PO TBEC
40.0000 mg | DELAYED_RELEASE_TABLET | Freq: Every day | ORAL | Status: DC
  Administered 2017-12-27 – 2017-12-28 (×2): 40 mg via ORAL
  Filled 2017-12-26 (×2): qty 1

## 2017-12-26 MED ORDER — ENOXAPARIN SODIUM 40 MG/0.4ML ~~LOC~~ SOLN
40.0000 mg | Freq: Every day | SUBCUTANEOUS | Status: DC
  Filled 2017-12-26: qty 0.4

## 2017-12-26 NOTE — Telephone Encounter (Signed)
Patient called in with c/o "SOB." She says "it started about a week ago and has gotten worse. I was in the bed last night and had to get up in a recliner to sleep." I asked the patient about pain, she denies. I asked about a cough, she says "yes, some cough." During the triage call, the patient was wheezing and could be heard on the phone by NT. The patient was stopping mid sentences to get a deep breath, obvious SOB during conversation. Patient advised to go to ED to be evaluated, she verbalized understanding and says her son will drive her.   Reason for Disposition . [1] MODERATE difficulty breathing (e.g., speaks in phrases, SOB even at rest, pulse 100-120) AND [2] NEW-onset or WORSE than normal  Answer Assessment - Initial Assessment Questions 1. RESPIRATORY STATUS: "Describe your breathing?" (e.g., wheezing, shortness of breath, unable to speak, severe coughing)      Shortness of breath 2. ONSET: "When did this breathing problem begin?"      Last week, getting worse 3. PATTERN "Does the difficult breathing come and go, or has it been constant since it started?"      Constant 4. SEVERITY: "How bad is your breathing?" (e.g., mild, moderate, severe)    - MILD: No SOB at rest, mild SOB with walking, speaks normally in sentences, can lay down, no retractions, pulse < 100.    - MODERATE: SOB at rest, SOB with minimal exertion and prefers to sit, cannot lie down flat, speaks in phrases, mild retractions, audible wheezing, pulse 100-120.    - SEVERE: Very SOB at rest, speaks in single words, struggling to breathe, sitting hunched forward, retractions, pulse > 120      Severe 5. RECURRENT SYMPTOM: "Have you had difficulty breathing before?" If so, ask: "When was the last time?" and "What happened that time?"     No 6. CARDIAC HISTORY: "Do you have any history of heart disease?" (e.g., heart attack, angina, bypass surgery, angioplasty)     Yes 7. LUNG HISTORY: "Do you have any history of lung  disease?"  (e.g., pulmonary embolus, asthma, emphysema)     Yes 8. CAUSE: "What do you think is causing the breathing problem?"      I don't know 9. OTHER SYMPTOMS: "Do you have any other symptoms? (e.g., dizziness, runny nose, cough, chest pain, fever)     Cough 10. PREGNANCY: "Is there any chance you are pregnant?" "When was your last menstrual period?"       No 11. TRAVEL: "Have you traveled out of the country in the last month?" (e.g., travel history, exposures)      No  Protocols used: BREATHING DIFFICULTY-A-AH

## 2017-12-26 NOTE — ED Notes (Signed)
Lovena Le, RN gave report to this RN at [id number]. Pt still not up on unit at this time, report/care handoff given to [facility], Therapist, sports.

## 2017-12-26 NOTE — ED Notes (Signed)
Patient transported to X-ray 

## 2017-12-26 NOTE — ED Provider Notes (Signed)
Patient placed in Quick Look pathway, seen and evaluated   Chief Complaint: SOB x4 days  HPI:   Pt is a 83 y.o. y/o female  with a PMHx of CHF, COPD, Afib not on anticoagulation, HTN, and multiple other medical conditions, presenting today with c/o shortness of breath for the last 4 days.  She is also had a dry cough and some orthopnea, requiring her to sleep in a recliner.  She states this feels like prior COPD exacerbations.  She quit smoking 17 years ago.  No known sick contacts.  She denies any chest pain, wheezing, or worsening leg swelling from baseline.  ROS: +SOB, +cough, +orthopnea. NO worsening LE swelling, CP, or wheezing  Physical Exam:  BP (!) 124/91 (BP Location: Left Arm)   Pulse 79   Temp 98.3 F (36.8 C) (Oral)   Resp (!) 22   Ht 5' 7" (1.702 m)   Wt 72.6 kg (160 lb)   LMP 12/03/2002   SpO2 95%   BMI 25.06 kg/m    Gen: No acute distress although working fairly hard to breathe  Neuro: Awake and Alert  Skin: Warm    Focused Exam: significantly diminished air movement throughout all lung fields, very tight sounding, no appreciable w/r/r but difficult to tell given how diminished her lung sounds are. Some supraclavicular retractions and increased WOB with accessory muscle use. No significant hypoxia, speaking in very fragmented sentences, SpO2 95% on RA    Initiation of care has begun. The patient has been counseled on the process, plan, and necessity for staying for the completion/evaluation, and the remainder of the medical screening examination  I informed nursing staff that this patient needs a bed ASAP.    [address], [place], [place] 12/26/17 1714    Gareth Morgan, MD 12/28/17 2139

## 2017-12-26 NOTE — ED Provider Notes (Signed)
[facility] EMERGENCY DEPARTMENT Provider Note   CSN: [id number] Arrival date & time: 12/26/17  [place]     History   Chief Complaint Chief Complaint  Patient presents with  . Shortness of Breath    HPI [name] is a 83 y.o. female.  HPI Pt has history of COPD.  Pt has been feeling short of breath on and off for a few weeks but has felt much worse this past week.  She tried to call her doctors office and spoke to the nurse who suggested she come to the ED.  Pt is getting short of breath walking short distances.   Pt is not normally on oxygen.  She has not been coughing a lot.  She has an inhaler that she uses but it has not helped much Past Medical History:  Diagnosis Date  . Arthritis   . COPD (chronic obstructive pulmonary disease) ([place])   . Dyspnea on exertion   . GERD (gastroesophageal reflux disease)   . Glaucoma of both eyes   . History of atrial fibrillation without current medication 2010   POST SURG 2010--  PER PT NO ISSUES SINCE  . History of breast cancer    DX DCIS IN 2004--  S/P RIGHT MASTECTOMY , NO CHEMORADIATION---  NO RECURRENCE  . History of CHF (congestive heart failure)    POST SURG 2010  . History of hypertension   . History of shingles    02/ 2015--  back of neck and left flank--  no residual pain  . ICH (intracerebral hemorrhage) ([place]) 02/05/2015  . Nephrolithiasis   . 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 Yeast vaginitis 06/17/2017  . Eczema, dyshidrotic 05/09/2016  . Routine general medical examination at a health care facility 11/12/2015  . Prediabetes 11/09/2015  . Estrogen deficiency 11/09/2015  . Alkaline phosphatase elevation 07/12/2015  . Gait disturbance, post-stroke 04/22/2015  . Constipation 03/08/2015  . Cerebellar hemorrhage, nontraumatic  (H[facility]) 02/22/2015  . Benign essential HTN 02/21/2015  . Chronic combined systolic and diastolic congestive heart failure ([place])   . Atrial fibrillation with RVR ([place])   . HLD (hyperlipidemia)   . Obstructive hydrocephalus   . ICH (intracerebral hemorrhage) ([place]) 02/05/2015  . COPD (chronic obstructive pulmonary disease) ([place]) 12/30/2014  . GERD (gastroesophageal reflux disease) 12/30/2014  . Bladder cancer ([place]) 01/01/2014  . Glaucoma 01/01/2014  . 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 CATARACT EXTRACTION W/ INTRAOCULAR LENS  IMPLANT, BILATERAL    . CRANIECTOMY N/A 02/05/2015   Procedure: SUBOCCIPITAL CRANIECTOMY EVACUATION OF HEMATOMA;  Surgeon: Consuella Lose, MD;  Location: [facility];  Service: Neurosurgery;  Laterality: N/A;  . CYSTOSCOPY W/ RETROGRADES Bilateral 08/11/2014   Procedure: CYSTOSCOPY WITH BILATERAL RETROGRADE PYELOGRAM AND MITOMYCIN INSTILLATION;  Surgeon: Alexis Frock, MD;  Location: [facility];  Service: Urology;  Laterality: Bilateral;  . CYSTOSCOPY W/ URETERAL STENT PLACEMENT Bilateral 03/26/2013   Procedure: CYSTOSCOPY WITH BILATERAL RETROGRADE PYELOGRAM/URETERAL STENT PLACEMENT;  Surgeon: Alexis Frock, MD;  Location: [facility];  Service: Urology;  Laterality: Bilateral;  . CYSTOSCOPY W/ URETERAL STENT PLACEMENT Left 05/13/2013   Procedure: CYSTOSCOPY WITH RETROGRADE PYELOGRAM/URETERAL STENT PLACEMENT STENT EXCHANGE;  Surgeon: Alexis Frock, MD;  Location: [facility];  Service: Urology;  Laterality: Left;  . PARTIAL MASTECTOMY WITH NEEDLE LOCALIZATION Right 01-14-2003   DCIS  . REMOVAL CYST LEFT HAND  2013  . TOTAL KNEE ARTHROPLASTY Right 03-22-2009  . TOTAL MASTECTOMY Right 02-03-2003   W/ SLN BX  AND  POST 03-01-2003 EVACUATION HEMATOMA  . TRANSTHORACIC ECHOCARDIOGRAM  03-25-2009   MILD LVF/ EF 70-80%/ MILD INCREASE SYSTOLIC PULMONARY   PRESSURE  . TRANSURETHRAL RESECTION OF BLADDER TUMOR WITH GYRUS (TURBT-GYRUS) N/A 03/26/2013   Procedure: TRANSURETHRAL RESECTION OF BLADDER TUMOR WITH GYRUS (TURBT-GYRUS);  Surgeon: Alexis Frock, MD;  Location: [facility];  Service: Urology;  Laterality: N/A;  . TRANSURETHRAL RESECTION OF BLADDER TUMOR WITH GYRUS (TURBT-GYRUS) N/A 05/13/2013   Procedure: TRANSURETHRAL RESECTION OF BLADDER TUMOR WITH GYRUS (TURBT-GYRUS)  RE-STAGING TRANSURETHRAL RESECTION OF BLADDER TUMOR, LEFT RETROGRADE PYELOGRAM AND STENT EXCHANGE;  Surgeon: Alexis Frock, MD;  Location: [facility];  Service: Urology;  Laterality: N/A;  . TRANSURETHRAL RESECTION OF BLADDER TUMOR WITH GYRUS (TURBT-GYRUS) N/A 08/11/2014   Procedure: TRANSURETHRAL RESECTION OF BLADDER TUMOR WITH GYRUS (TURBT-GYRUS);  Surgeon: Alexis Frock, MD;  Location: [facility];  Service: Urology;  Laterality: N/A;  . TUBAL LIGATION       OB History    Gravida  4   Para  4   Term      Preterm      AB      Living  4     SAB      TAB      Ectopic      Multiple      Live Births               Home Medications    Prior to Admission medications   Medication Sig Start Date End Date Taking? Authorizing Provider  atorvastatin (LIPITOR) 10 MG tablet Take 1 tablet (10 mg total) by mouth daily at 6 PM. 12/20/17  Yes Crenshaw, Denice Bors, MD  [place] 125 MCG tablet TAKE ONE TABLET BY MOUTH ONCE DAILY 06/25/17  Yes [name], MD  diltiazem (CARDIZEM CD) 240 MG 24 hr capsule TAKE ONE CAPSULE EACH DAY 10/08/17  Yes Crenshaw, Denice Bors, MD  latanoprost (XALATAN) 0.005 % ophthalmic solution Place 1 drop into both eyes at bedtime. Reported on 11/09/2015   Yes [provider]  pantoprazole (PROTONIX) 40 MG tablet TAKE ONE TABLET BY MOUTH ONCE DAILY 11/12/17  Yes Janith Lima, MD  SIMBRINZA 1-0.2 % SUSP Place 1 drop into both eyes 2 (two) times daily.  12/23/14  Yes [provider]    tiotropium (SPIRIVA HANDIHALER) 18 MCG inhalation capsule Place 1 capsule (18 mcg total) into inhaler and inhale daily. 06/17/17  Yes Janith Lima, MD    Family History Family History  Problem Relation Age of Onset  . Colon cancer Mother 52  . Colon cancer Maternal Grandmother   . Stroke Maternal Grandmother   . Breast cancer Maternal Aunt 75  . Cancer Maternal Aunt        ? ovarian  . Breast cancer Other 60       breast cancer, ovarian cancer  . Breast cancer Daughter 45  . Stroke Paternal Grandmother   . Heart attack Neg Hx   . Hypertension Neg Hx     Social History Social History   Tobacco Use  . Smoking status: Former Smoker    Packs/day: 1.00    Years: 40.00    Pack years: 40.00  Types: Cigarettes    Last attempt to quit: 03/20/2003    Years since quitting: 14.7  . Smokeless tobacco: Never Used  Substance Use Topics  . Alcohol use: No    Alcohol/week: 0.0 oz  . Drug use: No     Allergies   Morphine and related   Review of Systems Review of Systems  Respiratory: Negative for chest tightness.   Musculoskeletal:       Chronic edema, unchanged  All other systems reviewed and are negative.    Physical Exam Updated Vital Signs BP (!) 110/51   Pulse 95   Temp 98.3 F (36.8 C) (Oral)   Resp 15   Ht 1.702 m (_0 )   Wt 72.6 kg (160 lb)   LMP 12/03/2002   SpO2 93%   BMI 25.06 kg/m   Physical Exam  Constitutional: She appears well-developed and well-nourished. No distress.  HENT:  Head: Normocephalic and atraumatic.  Right Ear: External ear normal.  Left Ear: External ear normal.  Eyes: Conjunctivae are normal. Right eye exhibits no discharge. Left eye exhibits no discharge. No scleral icterus.  Neck: Neck supple. No tracheal deviation present.  Cardiovascular: Normal rate, regular rhythm and intact distal pulses.  Pulmonary/Chest: Effort normal and breath sounds normal. No stridor. No respiratory distress. She has no wheezes. She has no  rales.  Abdominal: Soft. Bowel sounds are normal. She exhibits no distension. There is no tenderness. There is no rebound and no guarding.  Musculoskeletal: She exhibits edema. She exhibits no tenderness.  Chronic venous stasis changes  Neurological: She is alert. She has normal strength. No cranial nerve deficit (no facial droop, extraocular movements intact, no slurred speech) or sensory deficit. She exhibits normal muscle tone. She displays no seizure activity. Coordination normal.  Skin: Skin is warm and dry. No rash noted.  Psychiatric: She has a normal mood and affect.  Nursing note and vitals reviewed.    ED Treatments / Results  Labs (all labs ordered are listed, but only abnormal results are displayed) Labs Reviewed  BASIC METABOLIC PANEL - Abnormal; Notable for the following components:      Result Value   Glucose, Bld 123 (*)    Calcium 8.8 (*)    All other components within normal limits  BRAIN NATRIURETIC PEPTIDE - Abnormal; Notable for the following components:   B Natriuretic Peptide 409.8 (*)    All other components within normal limits  CBC  I-STAT TROPONIN, ED    EKG EKG Interpretation  Date/Time:  Thursday December 26 2017 16:53:51 EDT Ventricular Rate:  80 PR Interval:    QRS Duration: 86 QT Interval:  374 QTC Calculation: 431 R Axis:   72 Text Interpretation:  Atrial fibrillation Moderate voltage criteria for LVH, may be normal variant ST & T wave abnormality, consider lateral ischemia Abnormal ECG No significant change since last tracing Confirmed by Dorie Rank [id number]) on 12/26/2017 9:40:17 PM   Radiology Dg Chest 2 View  Result Date: 12/26/2017 CLINICAL DATA:  Dyspnea beginning last evening. EXAM: CHEST - 2 VIEW COMPARISON:  05/13/2017 FINDINGS: Mild cardiac enlargement with aortic atherosclerosis. No aortic aneurysm. Lungs are hyperinflated consistent COPD. Chronic interstitial prominence with slight blunting of the costophrenic angles possibly from  pleural thickening or trace effusions. Chronic bronchitic change of the lungs. No alveolar consolidation. Biapical pleuroparenchymal thickening and/or scarring. No acute osseous abnormality. Degenerative changes are present along the dorsal spine. IMPRESSION: Borderline cardiomegaly with aortic atherosclerosis. Hyperinflated lungs consistent with COPD. Chronic bronchitic change  probable trace posterior pleural effusions. Electronically Signed   By: Ashley Royalty M.D.   On: 12/26/2017 17:59    Procedures Procedures (including critical care time)  Medications Ordered in ED Medications  albuterol (PROVENTIL) (2.5 MG/3ML) 0.083% nebulizer solution 5 mg (5 mg Nebulization Given 12/26/17 1716)  methylPREDNISolone sodium succinate (SOLU-MEDROL) 125 mg/2 mL injection 125 mg (125 mg Intravenous Given 12/26/17 1816)  ipratropium (ATROVENT) nebulizer solution 0.5 mg (0.5 mg Nebulization Given 12/26/17 1715)  albuterol (PROVENTIL) (2.5 MG/3ML) 0.083% nebulizer solution 5 mg (5 mg Nebulization Given 12/26/17 1823)     Initial Impression / Assessment and Plan / ED Course  I have reviewed the triage vital signs and the nursing notes.  Pertinent labs & imaging results that were available during my care of the patient were reviewed by me and considered in my medical decision making (see chart for details).  Clinical Course as of Dec 27 2138  Thu Dec 26, 2017  2139 Patient states she is feeling better.  However when she tried to walk around the emergency room her oxygen saturation dropped into the mid 80s.  Patient also became more short of breath.   [JK]    Clinical Course User Index [JK] Dorie Rank, MD    Patient presented to the emergency room for evaluation of shortness of breath.  Patient has a history of COPD.  She responded to breathing treatments however her symptoms did not complete.  Patient is feeling better at rest but whenever she tries to walk around her oxygen saturation drops into the mid 80s.   X-ray does not show evidence of pneumonia.  She has an increased BNP but I doubt CHF component.  I will consult the medical service for admission and further treatment.  Final Clinical Impressions(s) / ED Diagnoses   Final diagnoses:  COPD exacerbation ([place])      Dorie Rank, MD 12/26/17 2141

## 2017-12-26 NOTE — ED Notes (Signed)
O2 sats noted to be running from 89%-93% on RA at rest.

## 2017-12-26 NOTE — ED Notes (Signed)
Pt placed on 3L [place] due to oxygen saturation 82% on room air

## 2017-12-26 NOTE — ED Notes (Signed)
While ambulating pts sats noted to drop to 85% on RA.  Pt stated she was having some SOB. Dr. Tomi Bamberger spoke with pt in hallway about same and getting pt admitted upstairs.  Family bringing food for pt.

## 2017-12-26 NOTE — ED Triage Notes (Signed)
Pt endorses shob that began last night. Pt has hx of copd and has chronic dyspnea. Pt has started back using her inhaler over the last few days with relief. Pt visibly shob.

## 2017-12-26 NOTE — H&P (Signed)
History and Physical    MCKINLEIGH SCHUCHART MLJ:[id number] DOB: 1935/06/06 DOA: 12/26/2017  Referring MD/NP/PA: Dr. Marye Round PCP: Janith Lima, MD  Patient coming from: Home  Chief Complaint: Shortness of breath  I have personally briefly reviewed patient's old medical records in [place]   HPI: WESTYN KEATLEY is a 83 y.o. female with medical history significant of COPD, CHF, A. fib not on anticoagulation, glaucoma, GERD, breast cancer s/p resection, and recurrent bladder cancer s/p Turp; who presents with complaints of shortness of breath for several weeks/months acutely worse in the last 4 days.  Patient reported being unable to walk even a few feet without needing to rest. She is not on any oxygen at baseline.  At home she has a Spiriva inhaler which she has been using, but denies having a rescue inhaler.  Denies any improvement in symptoms with Spiriva use.  She notes having a mild dry cough that is unchanged.  Denies having any significant wheezing, allergies, chest pain, change in weight, recent falls, dysuria, fever, or chills.  Patient reports having some mild lower extremity swelling, but states that it is chronic and unchanged.  ED Course: Upon admission into the emergency department patient was noted to be afebrile, pulse 69-95, respirations up to 26, blood pressure 110/51-124/91, and O2 saturation was noted to be as low as 83% with ambulation.  Labs revealed BNP 409.8, troponin 0.2, and otherwise were unremarkable.  Chest x-ray showed borderline CHF with the lungs suggestive of COPD and possible trace posterior pleural effusions.  Patient was given DuoNeb breathing treatment and 125 mg of Solu-Medrol in the ED.  TRH called to admit.  Review of Systems  Constitutional: Positive for malaise/fatigue. Negative for chills and fever.  HENT: Negative for congestion and ear discharge.   Eyes: Negative for double vision and photophobia.  Respiratory: Positive for cough and  shortness of breath. Negative for sputum production and wheezing.   Cardiovascular: Negative for chest pain and leg swelling.  Gastrointestinal: Negative for nausea and vomiting.  Genitourinary: Negative for dysuria and frequency.  Musculoskeletal: Negative for falls and myalgias.  Skin: Negative for rash.  Neurological: Negative for focal weakness and loss of consciousness.  Endo/Heme/Allergies: Negative for environmental allergies. Does not bruise/bleed easily.  Psychiatric/Behavioral: Negative for memory loss and substance abuse.    Past Medical History:  Diagnosis Date  . Arthritis   . COPD (chronic obstructive pulmonary disease) ([place])   . Dyspnea on exertion   . GERD (gastroesophageal reflux disease)   . Glaucoma of both eyes   . History of atrial fibrillation without current medication 2010   POST SURG 2010--  PER PT NO ISSUES SINCE  . History of breast cancer    DX DCIS IN 2004--  S/P RIGHT MASTECTOMY , NO CHEMORADIATION---  NO RECURRENCE  . History of CHF (congestive heart failure)    POST SURG 2010  . History of hypertension   . History of shingles    02/ 2015--  back of neck and left flank--  no residual pain  . ICH (intracerebral hemorrhage) ([place]) 02/05/2015  . Nephrolithiasis   . Recurrent bladder papillary carcinoma ([place]) first dx 06/ 2014   s/p  turbt's  and instillation mitomycin c (chemo)//dx again in 2015-different type    Past Surgical History:  Procedure Laterality Date  . CATARACT EXTRACTION W/ INTRAOCULAR LENS  IMPLANT, BILATERAL    . CRANIECTOMY N/A 02/05/2015   Procedure: SUBOCCIPITAL CRANIECTOMY EVACUATION OF HEMATOMA;  Surgeon: Nena Polio  Kathyrn Sheriff, MD;  Location: [place] [facility];  Service: Neurosurgery;  Laterality: N/A;  . CYSTOSCOPY W/ RETROGRADES Bilateral 08/11/2014   Procedure: CYSTOSCOPY WITH BILATERAL RETROGRADE PYELOGRAM AND MITOMYCIN INSTILLATION;  Surgeon: Alexis Frock, MD;  Location: [facility];  Service: Urology;  Laterality:  Bilateral;  . CYSTOSCOPY W/ URETERAL STENT PLACEMENT Bilateral 03/26/2013   Procedure: CYSTOSCOPY WITH BILATERAL RETROGRADE PYELOGRAM/URETERAL STENT PLACEMENT;  Surgeon: Alexis Frock, MD;  Location[facility];  Service: Urology;  Laterality: Bilateral;  . CYSTOSCOPY W/ URETERAL STENT PLACEMENT Left 05/13/2013   Procedure: CYSTOSCOPY WITH RETROGRADE PYELOGRAM/URETERAL STENT PLACEMENT STENT EXCHANGE;  Surgeon: Alexis Frock, MD;  Location: [facility];  Service: Urology;  Laterality: Left;  . PARTIAL MASTECTOMY WITH NEEDLE LOCALIZATION Right 01-14-2003   DCIS  . REMOVAL CYST LEFT HAND  2013  . TOTAL KNEE ARTHROPLASTY Right 03-22-2009  . TOTAL MASTECTOMY Right 02-03-2003   W/ SLN BX  AND  POST 03-01-2003 EVACUATION HEMATOMA  . TRANSTHORACIC ECHOCARDIOGRAM  03-25-2009   MILD LVF/ EF 70-80%/ MILD INCREASE SYSTOLIC PULMONARY  PRESSURE  . TRANSURETHRAL RESECTION OF BLADDER TUMOR WITH GYRUS (TURBT-GYRUS) N/A 03/26/2013   Procedure: TRANSURETHRAL RESECTION OF BLADDER TUMOR WITH GYRUS (TURBT-GYRUS);  Surgeon: Alexis Frock, MD;  Location: [facility];  Service: Urology;  Laterality: N/A;  . TRANSURETHRAL RESECTION OF BLADDER TUMOR WITH GYRUS (TURBT-GYRUS) N/A 05/13/2013   Procedure: TRANSURETHRAL RESECTION OF BLADDER TUMOR WITH GYRUS (TURBT-GYRUS)  RE-STAGING TRANSURETHRAL RESECTION OF BLADDER TUMOR, LEFT RETROGRADE PYELOGRAM AND STENT EXCHANGE;  Surgeon: Alexis Frock, MD;  Location: [facility];  Service: Urology;  Laterality: N/A;  . TRANSURETHRAL RESECTION OF BLADDER TUMOR WITH GYRUS (TURBT-GYRUS) N/A 08/11/2014   Procedure: TRANSURETHRAL RESECTION OF BLADDER TUMOR WITH GYRUS (TURBT-GYRUS);  Surgeon: Alexis Frock, MD;  Location: [facility];  Service: Urology;  Laterality: N/A;  . TUBAL LIGATION       reports that she quit smoking about 14 years ago. Her smoking use included cigarettes. She has a 40.00 pack-year smoking  history. She has never used smokeless tobacco. She reports that she does not drink alcohol or use drugs.  Allergies  Allergen Reactions  . Morphine And Related Nausea And Vomiting    Family History  Problem Relation Age of Onset  . Colon cancer Mother 69  . Colon cancer Maternal Grandmother   . Stroke Maternal Grandmother   . Breast cancer Maternal Aunt 34  . Cancer Maternal Aunt        ? ovarian  . Breast cancer Other 60       breast cancer, ovarian cancer  . Breast cancer Daughter 62  . Stroke Paternal Grandmother   . Heart attack Neg Hx   . Hypertension Neg Hx     Prior to Admission medications   Medication Sig Start Date End Date Taking? Authorizing Provider  atorvastatin (LIPITOR) 10 MG tablet Take 1 tablet (10 mg total) by mouth daily at 6 PM. 12/20/17  Yes Crenshaw, Denice Bors, MD  [place] 125 MCG tablet TAKE ONE TABLET BY MOUTH ONCE DAILY 06/25/17  Yes [name], MD  diltiazem (CARDIZEM CD) 240 MG 24 hr capsule TAKE ONE CAPSULE EACH DAY 10/08/17  Yes Crenshaw, Denice Bors, MD  latanoprost (XALATAN) 0.005 % ophthalmic solution Place 1 drop into both eyes at bedtime. Reported on 11/09/2015   Yes [provider]  pantoprazole (PROTONIX) 40 MG tablet TAKE ONE TABLET BY MOUTH ONCE DAILY 11/12/17  Yes Janith Lima, MD  SIMBRINZA 1-0.2 % SUSP Place 1 drop into both eyes 2 (two) times daily.  12/23/14  Yes [provider]  tiotropium (SPIRIVA HANDIHALER) 18 MCG inhalation capsule Place 1 capsule (18 mcg total) into inhaler and inhale daily. 06/17/17  Yes Janith Lima, MD    Physical Exam:  Constitutional: Elderly female in NAD, calm, comfortable Vitals:   12/26/17 1854 12/26/17 1915 12/26/17 1953 12/26/17 2000  BP:   112/73 (!) 110/51  Pulse: 80 69 85 95  Resp: (!) _0 Temp:      TempSrc:      SpO2: 91% (!) 83% 93% 93%  Weight:      Height:       Eyes: PERRL, lids and conjunctivae normal ENMT: Mucous membranes are moist. Posterior pharynx  clear of any exudate or lesions.  Neck: normal, supple, no masses, no thyromegaly Respiratory: Decreased overall aeration.  No wheezes noted at this time.  Patient on 3 L of nasal cannula oxygen. Cardiovascular: Regular rate and rhythm, no murmurs / rubs / gallops.  Trace lower extremity edema extremity edema. 2+ pedal pulses. No carotid bruits.  Abdomen: no tenderness, no masses palpated. No hepatosplenomegaly. Bowel sounds positive.  Musculoskeletal: no clubbing / cyanosis. No joint deformity upper and lower extremities. Good ROM, no contractures. Normal muscle tone.  Skin: Venous stasis changes noted bilaterally. Neurologic: CN 2-12 grossly intact. Sensation intact, DTR normal. Strength 5/5 in all 4.  Psychiatric: Normal judgment and insight. Alert and oriented x 3. Normal mood.     Labs on Admission: I have personally reviewed following labs and imaging studies  CBC: Recent Labs  Lab 12/26/17 2004  WBC 8.1  HGB 13.8  HCT 43.4  MCV 95.4  PLT 203   Basic Metabolic Panel: Recent Labs  Lab 12/26/17 2004  NA 140  K 3.9  CL 102  CO2 25  GLUCOSE 123*  BUN 14  CREATININE 0.79  CALCIUM 8.8*   GFR: Estimated Creatinine Clearance: 54.5 mL/min (by C-G formula based on SCr of 0.79 mg/dL). Liver Function Tests: No results for input(s): AST, ALT, ALKPHOS, BILITOT, PROT, ALBUMIN in the last 168 hours. No results for input(s): LIPASE, AMYLASE in the last 168 hours. No results for input(s): AMMONIA in the last 168 hours. Coagulation Profile: No results for input(s): INR, PROTIME in the last 168 hours. Cardiac Enzymes: No results for input(s): CKTOTAL, CKMB, CKMBINDEX, TROPONINI in the last 168 hours. BNP (last 3 results) No results for input(s): PROBNP in the last 8760 hours. HbA1C: No results for input(s): HGBA1C in the last 72 hours. CBG: No results for input(s): GLUCAP in the last 168 hours. Lipid Profile: No results for input(s): CHOL, HDL, LDLCALC, TRIG, CHOLHDL,  LDLDIRECT in the last 72 hours. Thyroid Function Tests: No results for input(s): TSH, T4TOTAL, FREET4, T3FREE, THYROIDAB in the last 72 hours. Anemia Panel: No results for input(s): VITAMINB12, FOLATE, FERRITIN, TIBC, IRON, RETICCTPCT in the last 72 hours. Urine analysis:    Component Value Date/Time   COLORURINE YELLOW 02/19/2015 1921   APPEARANCEUR CLEAR 02/19/2015 1921   LABSPEC 1.022 02/19/2015 1921   PHURINE 6.5 02/19/2015 1921   GLUCOSEU NEGATIVE 02/19/2015 1921   HGBUR SMALL (A) 02/19/2015 [place] NEGATIVE 02/19/2015 1921   KETONESUR 40 (A) 02/19/2015 1921   PROTEINUR NEGATIVE 02/19/2015 1921   UROBILINOGEN 1.0 02/19/2015 1921   NITRITE NEGATIVE 02/19/2015 1921   LEUKOCYTESUR SMALL (A) 02/19/2015 1921   Sepsis Labs: No results found for this  or any previous visit (from the past 240 hour(s)).   Radiological Exams on Admission: Dg Chest 2 View  Result Date: 12/26/2017 CLINICAL DATA:  Dyspnea beginning last evening. EXAM: CHEST - 2 VIEW COMPARISON:  05/13/2017 FINDINGS: Mild cardiac enlargement with aortic atherosclerosis. No aortic aneurysm. Lungs are hyperinflated consistent COPD. Chronic interstitial prominence with slight blunting of the costophrenic angles possibly from pleural thickening or trace effusions. Chronic bronchitic change of the lungs. No alveolar consolidation. Biapical pleuroparenchymal thickening and/or scarring. No acute osseous abnormality. Degenerative changes are present along the dorsal spine. IMPRESSION: Borderline cardiomegaly with aortic atherosclerosis. Hyperinflated lungs consistent with COPD. Chronic bronchitic change probable trace posterior pleural effusions. Electronically Signed   By: Ashley Royalty M.D.   On: 12/26/2017 17:59    EKG: Independently reviewed.  Atrial fibrillation at 80 bpm with signs of LVH.  Assessment/Plan Suspected COPD exacerbation, hypoxia: Acute.  Patient presents with progressively worsening shortness of breath for  the last several weeks.  Patient seems to possibly only be on Spiriva her O2 saturations noted to drop in the mid 80s with ambulation.  Patient was given 125 mg of Solu-Medrol and albuterol breathing treatment. - Admit to a telemetry bed - Continuous pulse oximetry overnight with nasal cannula oxygen as needed - Prednisone 40 mg p.o. daily - Duonebs QID and prn SOB/wheezing - Azithromycin IV - Likely will need physical therapy evaluation for need of home oxygen priorto discharge  Atrial fibrillation:  chronic.  Patient noted to be rate controlled on admission. Chadsvasc Score = 5.  Patient not currently on anticoagulation and previous history of ICH. - Continue diltiazem and digoxin - add-on digoxin level and TSH  H/O HFpEF: Patient's last EF was noted to be 65 to 70% with grade 1 diastolic dysfunction per echo in 02/07/2015.  She does not appear to be fluid overloaded at this time. - Strict I&O's and daily weights - Patient may benefit from echocardiogram  Hyperlipidemia - Continue atorvastatin  Glaucoma - Continue current home medications with substitutions per pharmacy  History of intracranial bleed: Noted back in 02/2015 and status post craniotomy.  History of breast and papillary bladder cancer: She reports being diagnosed in 2004 with right-sided breast cancer and status post resection without chemo or radiation.  History of recurrent bladder papillary carcinoma first diagnosed in 2014 status post TURP and chemotherapy. - Continue outpatient follow-up  GERD - Continue Protonix  DVT prophylaxis: Lovenox Code Status: Full Family Communication: No family present at bedside.  Disposition Plan: Likely discharge home once medically stable Consults called: none  Admission status: Inpatient  Norval Morton MD [facility] Pager [phone]   If 7PM-7AM, please contact night-coverage [email or web address] Password [facility] [place]  12/26/2017, 10:12 PM

## 2017-12-27 ENCOUNTER — Encounter (HOSPITAL_COMMUNITY): Payer: Self-pay | Admitting: *Deleted

## 2017-12-27 ENCOUNTER — Other Ambulatory Visit: Payer: Self-pay

## 2017-12-27 DIAGNOSIS — J441 Chronic obstructive pulmonary disease with (acute) exacerbation: Principal | ICD-10-CM

## 2017-12-27 DIAGNOSIS — Z8679 Personal history of other diseases of the circulatory system: Secondary | ICD-10-CM

## 2017-12-27 LAB — CBC
HCT: 42.5 % (ref 36.0–46.0)
HEMOGLOBIN: 13.4 g/dL (ref 12.0–15.0)
MCH: 30.1 pg (ref 26.0–34.0)
MCHC: 31.5 g/dL (ref 30.0–36.0)
MCV: 95.5 fL (ref 78.0–100.0)
Platelets: 219 10*3/uL (ref 150–400)
RBC: 4.45 MIL/uL (ref 3.87–5.11)
RDW: 14.8 % (ref 11.5–15.5)
WBC: 9 10*3/uL (ref 4.0–10.5)

## 2017-12-27 LAB — BRAIN NATRIURETIC PEPTIDE: B Natriuretic Peptide: 506.5 pg/mL — ABNORMAL HIGH (ref 0.0–100.0)

## 2017-12-27 LAB — BASIC METABOLIC PANEL
Anion gap: 12 (ref 5–15)
BUN: 18 mg/dL (ref 6–20)
CHLORIDE: 102 mmol/L (ref 101–111)
CO2: 27 mmol/L (ref 22–32)
CREATININE: 0.94 mg/dL (ref 0.44–1.00)
Calcium: 8.7 mg/dL — ABNORMAL LOW (ref 8.9–10.3)
GFR calc Af Amer: >60 mL/min (ref >60)
GFR calc non Af Amer: 56 mL/min — ABNORMAL LOW (ref >60)
GLUCOSE: 300 mg/dL — AB (ref 65–99)
POTASSIUM: 4.1 mmol/L (ref 3.5–5.1)
SODIUM: 141 mmol/L (ref 135–145)

## 2017-12-27 LAB — TROPONIN I
Troponin I: <0.03 ng/mL (ref <0.03)
Troponin I: <0.03 ng/mL (ref <0.03)

## 2017-12-27 LAB — HEMOGLOBIN A1C
Hgb A1c MFr Bld: 5.9 % — ABNORMAL HIGH (ref 4.8–5.6)
Mean Plasma Glucose: 122.63 mg/dL

## 2017-12-27 LAB — GLUCOSE, CAPILLARY
GLUCOSE-CAPILLARY: 169 mg/dL — AB (ref 65–99)
GLUCOSE-CAPILLARY: 129 mg/dL — AB (ref 65–99)

## 2017-12-27 LAB — TSH: TSH: 0.632 u[IU]/mL (ref 0.350–4.500)

## 2017-12-27 MED ORDER — INSULIN ASPART 100 UNIT/ML ~~LOC~~ SOLN
0.0000 [IU] | Freq: Three times a day (TID) | SUBCUTANEOUS | Status: DC
  Administered 2017-12-27: 2 [IU] via SUBCUTANEOUS
  Administered 2017-12-27: 1 [IU] via SUBCUTANEOUS

## 2017-12-27 MED ORDER — IPRATROPIUM-ALBUTEROL 0.5-2.5 (3) MG/3ML IN SOLN
3.0000 mL | Freq: Two times a day (BID) | RESPIRATORY_TRACT | Status: DC
  Administered 2017-12-27 – 2017-12-28 (×2): 3 mL via RESPIRATORY_TRACT
  Filled 2017-12-27 (×2): qty 3

## 2017-12-27 MED ORDER — PREDNISONE 20 MG PO TABS
40.0000 mg | ORAL_TABLET | Freq: Every day | ORAL | Status: DC
  Administered 2017-12-27 – 2017-12-28 (×2): 40 mg via ORAL
  Filled 2017-12-27 (×2): qty 2

## 2017-12-27 NOTE — Telephone Encounter (Signed)
Pt has been admitted.

## 2017-12-27 NOTE — Progress Notes (Addendum)
PROGRESS NOTE    [name]  JYN:[id number] DOB: Mar 16, 1935 DOA: 12/26/2017 PCP: Janith Lima, MD   Brief Narrative:  83 year old with past medical history relevant for paroxysmal atrial fibrillation, not on anticoagulation due to intracranial hemorrhage, hyperlipidemia, chronic diastolic heart failure by echo on 02/06/2018, recurrent bladder cancer status post transurethral resection, breast cancer status post resection who is admitted with acute hypoxic respiratory failure in the setting of COPD exacerbation.   Assessment & Plan:   Principal Problem:   COPD exacerbation ([place]) Active Problems:   CHF (congestive heart failure) ([facility])   Glaucoma   GERD (gastroesophageal reflux disease)   HLD (hyperlipidemia)   Atrial fibrillation, chronic ([facility])   History of intracranial hemorrhage   #) Acute hypoxic respiratory failure due to COPD exacerbation: Unknown Gold stage - Continue prednisone 40 mg daily -Continue scheduled bronchodilators and as needed -Continue to tiotropium daily -Continue azithromycin IV  #) Proximal atrial fibrillation: On anticoagulation due to prior intracranial hemorrhage -Continue digoxin 125 mcg daily next-continue diltiazem 240 mg daily  #) Hyperlipidemia: -Continue atorvastatin 10 mg nightly  Fluids: Tolerating p.o. Liquids: Monitor and supplement Nutrition: Heart healthy diet  Prophylaxis: Enoxaparin  Disposition: Pending resolution of hypoxia  Full code   Consultants:   None  Procedures: (Don't include imaging studies which can be auto populated. Include things that cannot be auto populated i.e. Echo, Carotid and venous dopplers, Foley, Bipap, HD, tubes/drains, wound vac, central lines etc)  None  Antimicrobials: (specify start and planned stop date. Auto populated tables are space occupying and do not give end dates)  None   Subjective: Patient reports that she feels much better.  Her hypoxia and respiratory distress have  improved dramatically.  She denies any nausea, vomiting, abdominal pain, chest pain.  Objective: Vitals:   12/27/17 0549 12/27/17 0822 12/27/17 0938 12/27/17 1329  BP: 112/68  114/65   Pulse: 71  98   Resp: 19  20   Temp: 98.3 F (36.8 C)  98 F (36.7 C)   TempSrc: Oral  Oral   SpO2: 93% 90% 94% 96%  Weight:      Height:        Intake/Output Summary (Last 24 hours) at 12/27/2017 1604 Last data filed at 12/27/2017 1340 Gross per 24 hour  Intake 850 ml  Output 0 ml  Net 850 ml   Filed Weights   12/26/17 1654  Weight: 72.6 kg (160 lb)    Examination:  General exam: Appears calm and comfortable  Respiratory system: Mildly increased work of breathing, scattered rhonchi, diminished lung sounds at bases, no wheezes or crackles i Cardiovascular system: Regular rate and rhythm, no murmurs, Gastrointestinal system: Soft, nondistended, plus bowel sounds, no rebound or tenderness Central nervous system: Alert and oriented. No focal neurological deficits. Extremities: Lower extremity edema Skin: No rashes visible skin Psychiatry: Judgement and insight appear normal. Mood & affect appropriate.     Data Reviewed: I have personally reviewed following labs and imaging studies  CBC: Recent Labs  Lab 12/26/17 2004 12/27/17 0217  WBC 8.1 9.0  HGB 13.8 13.4  HCT 43.4 42.5  MCV 95.4 95.5  PLT 236 865   Basic Metabolic Panel: Recent Labs  Lab 12/26/17 2004 12/27/17 0217  NA 140 141  K 3.9 4.1  CL 102 102  CO2 25 27  GLUCOSE 123* 300*  BUN 14 18  CREATININE 0.79 0.94  CALCIUM 8.8* 8.7*   GFR: Estimated Creatinine Clearance: 46.4 mL/min (by C-G formula based on  SCr of 0.94 mg/dL). Liver Function Tests: No results for input(s): AST, ALT, ALKPHOS, BILITOT, PROT, ALBUMIN in the last 168 hours. No results for input(s): LIPASE, AMYLASE in the last 168 hours. No results for input(s): AMMONIA in the last 168 hours. Coagulation Profile: No results for input(s): INR, PROTIME  in the last 168 hours. Cardiac Enzymes: Recent Labs  Lab 12/27/17 0217 12/27/17 0708  TROPONINI <0.03 <0.03   BNP (last 3 results) No results for input(s): PROBNP in the last 8760 hours. HbA1C: Recent Labs    12/27/17 0217  HGBA1C 5.9*   CBG: Recent Labs  Lab 12/27/17 1241  GLUCAP 169*   Lipid Profile: No results for input(s): CHOL, HDL, LDLCALC, TRIG, CHOLHDL, LDLDIRECT in the last 72 hours. Thyroid Function Tests: Recent Labs    12/26/17 2345  TSH 0.632   Anemia Panel: No results for input(s): VITAMINB12, FOLATE, FERRITIN, TIBC, IRON, RETICCTPCT in the last 72 hours. Sepsis Labs: No results for input(s): PROCALCITON, LATICACIDVEN in the last 168 hours.  No results found for this or any previous visit (from the past 240 hour(s)).       Radiology Studies: Dg Chest 2 View  Result Date: 12/26/2017 CLINICAL DATA:  Dyspnea beginning last evening. EXAM: CHEST - 2 VIEW COMPARISON:  05/13/2017 FINDINGS: Mild cardiac enlargement with aortic atherosclerosis. No aortic aneurysm. Lungs are hyperinflated consistent COPD. Chronic interstitial prominence with slight blunting of the costophrenic angles possibly from pleural thickening or trace effusions. Chronic bronchitic change of the lungs. No alveolar consolidation. Biapical pleuroparenchymal thickening and/or scarring. No acute osseous abnormality. Degenerative changes are present along the dorsal spine. IMPRESSION: Borderline cardiomegaly with aortic atherosclerosis. Hyperinflated lungs consistent with COPD. Chronic bronchitic change probable trace posterior pleural effusions. Electronically Signed   By: Ashley Royalty M.D.   On: 12/26/2017 17:59        Scheduled Meds: . atorvastatin  10 mg Oral q1800  . brimonidine  1 drop Both Eyes TID  . brinzolamide  1 drop Both Eyes TID  . digoxin  125 mcg Oral Daily  . diltiazem  240 mg Oral Daily  . enoxaparin (LOVENOX) injection  40 mg Subcutaneous Daily  . guaiFENesin  600 mg  Oral BID  . insulin aspart  0-9 Units Subcutaneous TID WC  . ipratropium-albuterol  3 mL Nebulization QID  . latanoprost  1 drop Both Eyes QHS  . pantoprazole  40 mg Oral Daily  . predniSONE  40 mg Oral Q breakfast   Continuous Infusions: . azithromycin Stopped (12/27/17 0310)     LOS: 1 day    Time spent: [place], MD [facility]   If 7PM-7AM, please contact night-coverage [email or web address] Password [facility] 12/27/2017, 4:04 PM

## 2017-12-28 LAB — GLUCOSE, CAPILLARY
GLUCOSE-CAPILLARY: 83 mg/dL (ref 65–99)
GLUCOSE-CAPILLARY: 85 mg/dL (ref 65–99)

## 2017-12-28 LAB — BASIC METABOLIC PANEL WITH GFR
Anion gap: 9 (ref 5–15)
BUN: 22 mg/dL — ABNORMAL HIGH (ref 6–20)
CO2: 31 mmol/L (ref 22–32)
Calcium: 9 mg/dL (ref 8.9–10.3)
Chloride: 101 mmol/L (ref 101–111)
Creatinine, Ser: 0.91 mg/dL (ref 0.44–1.00)
GFR calc Af Amer: >60 mL/min
GFR calc non Af Amer: 58 mL/min — ABNORMAL LOW
Glucose, Bld: 96 mg/dL (ref 65–99)
Potassium: 4.5 mmol/L (ref 3.5–5.1)
Sodium: 141 mmol/L (ref 135–145)

## 2017-12-28 MED ORDER — AZITHROMYCIN 250 MG PO TABS: 250.0000 mg | ORAL_TABLET | Freq: Every day | ORAL | 0 refills | Status: AC

## 2017-12-28 MED ORDER — PREDNISONE 20 MG PO TABS: 40.0000 mg | ORAL_TABLET | Freq: Every day | ORAL | 0 refills | Status: DC

## 2017-12-28 MED ORDER — AEROCHAMBER PLUS MISC: 2 refills | Status: DC

## 2017-12-28 MED ORDER — GUAIFENESIN ER 600 MG PO TB12: 600.0000 mg | ORAL_TABLET | Freq: Two times a day (BID) | ORAL | 1 refills | Status: DC

## 2017-12-28 MED ORDER — ALBUTEROL SULFATE HFA 108 (90 BASE) MCG/ACT IN AERS: 2.0000 | INHALATION_SPRAY | Freq: Four times a day (QID) | RESPIRATORY_TRACT | 2 refills | Status: DC | PRN

## 2017-12-28 NOTE — Discharge Instructions (Signed)

## 2017-12-28 NOTE — Progress Notes (Signed)
Patients 02 sats on room air and at rest were 88-89%, improved to 93% with 2L of oxygen.  Patient refused to complete desat screen, and is adamant that she will not wear oxygen at home.

## 2017-12-28 NOTE — Progress Notes (Signed)
Patient discharged to home, AVS reviewed with patient and family members, patient was reminded to follow up with PCP regarding home 02 needs, IV removed, telebox returned, patient left floor via wheelchair.

## 2017-12-28 NOTE — Discharge Summary (Signed)
Physician Discharge Summary  [name] LNL:[id number] DOB: [birth year] DOA: 12/26/2017  PCP: Janith Lima, MD  Admit date: 12/26/2017 Discharge date: 12/28/2017  Admitted From: Home Disposition: Home  Recommendations for Outpatient Follow-up:  1. Follow up with PCP in 1-2 weeks 2. Please obtain BMP/CBC in one week   Home Health: No Equipment/Devices: No  Discharge Condition: stable CODE STATUS: FULL Diet recommendation: Heart Healthy  Brief/Interim Summary:  #) Acute COPD exacerbation: Patient was admitted with acute COPD exacerbation.  She was given scheduled bronchodilators, IV steroids, IV azithromycin with resolution of her hypoxia and wheezing.  She was discharged home on oral azithromycin for 2 additional days and oral prednisone for 5 days.  She was discharged with albuterol inhaler to take as needed.  She was told to follow-up with her PCP for further management.  #) Paroxysmal atrial fibrillation: Patient is not on anticoagulation due to prior intracranial hemorrhage.  Patient was continued on her home digoxin and diltiazem.  #) Hyperlipidemia: Patient was continued on atorvastatin.  Discharge Diagnoses:  Principal Problem:   COPD exacerbation ([place]) Active Problems:   CHF (congestive heart failure) (HCC)   Glaucoma   GERD (gastroesophageal reflux disease)   HLD (hyperlipidemia)   Atrial fibrillation, chronic (HCC)   History of intracranial hemorrhage    Discharge Instructions  Discharge Instructions    Call MD for:  difficulty breathing, headache or visual disturbances   Complete by:  As directed    Call MD for:  hives   Complete by:  As directed    Call MD for:  persistant nausea and vomiting   Complete by:  As directed    Call MD for:  redness, tenderness, or signs of infection (pain, swelling, redness, odor or green/yellow discharge around incision site)   Complete by:  As directed    Call MD for:  severe uncontrolled pain   Complete by:  As  directed    Call MD for:  temperature >100.4   Complete by:  As directed    Diet - low sodium heart healthy   Complete by:  As directed    Discharge instructions   Complete by:  As directed    Please follow-up with your primary care doctor in 1 to 2 weeks.  Please take all of antibiotics and steroids as prescribed.  Please use the albuterol inhaler every 4 hours as needed for shortness of breath or wheezing.   Increase activity slowly   Complete by:  As directed      Allergies as of 12/28/2017      Reactions   Heparin    Previous history of hemorrhagic stroke.   Morphine And Related Nausea And Vomiting      Medication List    TAKE these medications   AEROCHAMBER PLUS inhaler Use as instructed   albuterol 108 (90 Base) MCG/ACT inhaler Commonly known as:  PROVENTIL HFA;VENTOLIN HFA Inhale 2 puffs into the lungs every 6 (six) hours as needed for wheezing or shortness of breath.   atorvastatin 10 MG tablet Commonly known as:  LIPITOR Take 1 tablet (10 mg total) by mouth daily at 6 PM.   azithromycin 250 MG tablet Commonly known as:  ZITHROMAX Z-PAK Take 1 tablet (250 mg total) by mouth daily for 2 days. Take 2 tablets (500 mg) on  Day 1,  followed by 1 tablet (250 mg) once daily on Days 2 through 5.   DIGOX 0.125 MG tablet Generic drug:  digoxin TAKE ONE TABLET BY  MOUTH ONCE DAILY   diltiazem 240 MG 24 hr capsule Commonly known as:  CARDIZEM CD TAKE ONE CAPSULE EACH DAY   guaiFENesin 600 MG 12 hr tablet Commonly known as:  MUCINEX Take 1 tablet (600 mg total) by mouth 2 (two) times daily.   latanoprost 0.005 % ophthalmic solution Commonly known as:  XALATAN Place 1 drop into both eyes at bedtime. Reported on 11/09/2015   pantoprazole 40 MG tablet Commonly known as:  PROTONIX TAKE ONE TABLET BY MOUTH ONCE DAILY   predniSONE 20 MG tablet Commonly known as:  DELTASONE Take 2 tablets (40 mg total) by mouth daily with breakfast. Start taking on:  12/29/2017    SIMBRINZA 1-0.2 % Susp Generic drug:  Brinzolamide-Brimonidine Place 1 drop into both eyes 2 (two) times daily.   tiotropium 18 MCG inhalation capsule Commonly known as:  SPIRIVA HANDIHALER Place 1 capsule (18 mcg total) into inhaler and inhale daily.       Allergies  Allergen Reactions  . Heparin     Previous history of hemorrhagic stroke.  Marland Kitchen Morphine And Related Nausea And Vomiting    Consultations:  None   Procedures/Studies: Dg Chest 2 View  Result Date: 12/26/2017 CLINICAL DATA:  Dyspnea beginning last evening. EXAM: CHEST - 2 VIEW COMPARISON:  05/13/2017 FINDINGS: Mild cardiac enlargement with aortic atherosclerosis. No aortic aneurysm. Lungs are hyperinflated consistent COPD. Chronic interstitial prominence with slight blunting of the costophrenic angles possibly from pleural thickening or trace effusions. Chronic bronchitic change of the lungs. No alveolar consolidation. Biapical pleuroparenchymal thickening and/or scarring. No acute osseous abnormality. Degenerative changes are present along the dorsal spine. IMPRESSION: Borderline cardiomegaly with aortic atherosclerosis. Hyperinflated lungs consistent with COPD. Chronic bronchitic change probable trace posterior pleural effusions. Electronically Signed   By: Ashley Royalty M.D.   On: 12/26/2017 17:59       Subjective:   Discharge Exam: Vitals:   12/28/17 0447 12/28/17 0956  BP: 129/76 121/67  Pulse: 83 78  Resp: 16 20  Temp: 98.1 F (36.7 C) 97.9 F (36.6 C)  SpO2: 93% (!) 88%   Vitals:   12/27/17 1937 12/27/17 2112 12/28/17 0447 12/28/17 0956  BP:  (!) 107/58 129/76 121/67  Pulse:  97 83 78  Resp:  _0 Temp:  98.3 F (36.8 C) 98.1 F (36.7 C) 97.9 F (36.6 C)  TempSrc:  Oral Oral Oral  SpO2: 97% 92% 93% (!) 88%  Weight:      Height:        General exam: Appears calm and comfortable  Respiratory system:  No increased work of breathing, scattered rhonchi, no wheezes or  crackles Cardiovascular system: Regular rate and rhythm, no murmurs, Gastrointestinal system: Soft, nondistended, plus bowel sounds, no rebound or tenderness Central nervous system: Alert and oriented. No focal neurological deficits. Extremities:  1+ lower extremity edema Skin: No rashes visible skin Psychiatry: Judgement and insight appear normal. Mood & affect appropriate.      The results of significant diagnostics from this hospitalization (including imaging, microbiology, ancillary and laboratory) are listed below for reference.     Microbiology: No results found for this or any previous visit (from the past 240 hour(s)).   Labs: BNP (last 3 results) Recent Labs    12/26/17 2004 12/27/17 0931  BNP 409.8* 758.8*   Basic Metabolic Panel: Recent Labs  Lab 12/26/17 2004 12/27/17 0217 12/28/17 0536  NA 140 141 141  K 3.9 4.1 4.5  CL 102 102 101  CO2  _0 GLUCOSE 123* 300* 96  BUN 14 18 22*  CREATININE 0.79 0.94 0.91  CALCIUM 8.8* 8.7* 9.0   Liver Function Tests: No results for input(s): AST, ALT, ALKPHOS, BILITOT, PROT, ALBUMIN in the last 168 hours. No results for input(s): LIPASE, AMYLASE in the last 168 hours. No results for input(s): AMMONIA in the last 168 hours. CBC: Recent Labs  Lab 12/26/17 2004 12/27/17 0217  WBC 8.1 9.0  HGB 13.8 13.4  HCT 43.4 42.5  MCV 95.4 95.5  PLT 236 219   Cardiac Enzymes: Recent Labs  Lab 12/27/17 0217 12/27/17 0708  TROPONINI <0.03 <0.03   BNP: Invalid input(s): POCBNP CBG: Recent Labs  Lab 12/27/17 1241 12/27/17 1655 12/28/17 0747 12/28/17 1137  GLUCAP 169* 129* 83 85   D-Dimer No results for input(s): DDIMER in the last 72 hours. Hgb A1c Recent Labs    12/27/17 0217  HGBA1C 5.9*   Lipid Profile No results for input(s): CHOL, HDL, LDLCALC, TRIG, CHOLHDL, LDLDIRECT in the last 72 hours. Thyroid function studies Recent Labs    12/26/17 2345  TSH 0.632   Anemia work up No results for  input(s): VITAMINB12, FOLATE, FERRITIN, TIBC, IRON, RETICCTPCT in the last 72 hours. Urinalysis    Component Value Date/Time   COLORURINE YELLOW 02/19/2015 1921   APPEARANCEUR CLEAR 02/19/2015 1921   LABSPEC 1.022 02/19/2015 1921   PHURINE 6.5 02/19/2015 1921   GLUCOSEU NEGATIVE 02/19/2015 1921   HGBUR SMALL (A) 02/19/2015 [place] NEGATIVE 02/19/2015 1921   KETONESUR 40 (A) 02/19/2015 1921   PROTEINUR NEGATIVE 02/19/2015 1921   UROBILINOGEN 1.0 02/19/2015 1921   NITRITE NEGATIVE 02/19/2015 1921   LEUKOCYTESUR SMALL (A) 02/19/2015 1921   Sepsis Labs Invalid input(s): PROCALCITONIN,  WBC,  LACTICIDVEN Microbiology No results found for this or any previous visit (from the past 240 hour(s)).   Time coordinating discharge: Over 30 minutes  SIGNED:   Cristy Folks, MD  [facility] 12/28/2017, 1:29 PM   If 7PM-7AM, please contact night-coverage [email or web address] Password [email or web address]

## 2017-12-30 ENCOUNTER — Telehealth: Payer: Self-pay | Admitting: *Deleted

## 2017-12-30 NOTE — Telephone Encounter (Signed)
Called pt to make [name] appt no answer LMOM RTC.Marland KitchenJohny Chess

## 2017-12-31 NOTE — Telephone Encounter (Signed)
Called pt again still no answer LMOM RTC ASAP to make hosp f/u.Marland KitchenJohny Chess

## 2018-01-01 NOTE — Telephone Encounter (Signed)
Pt called back 12/31/17 yesterday afternoon and PEC went ahead and made appt for [facility] f/u. Called pt to complete the TCM call this am../lmb  Transition Care Management Follow-up Telephone Call   Date discharged? 12/28/17   How have you been since you were released from the hospital? Pt states she is doing a lil better    Do you understand why you were in the hospital? YES   Do you understand the discharge instructions? YES   Where were you discharged to? Home   Items Reviewed:  Medications reviewed: YES  Allergies reviewed: YES  Dietary changes reviewed: YES, heart healthy  Referrals reviewed: No referral needed   Functional Questionnaire:   Activities of Daily Living (ADLs):   She states she are independent in the following: bathing and hygiene, feeding, continence, grooming, toileting and dressing States they require assistance with the following: ambulation   Any transportation issues/concerns?: NO   Any patient concerns? NO   Confirmed importance and date/time of follow-up visits scheduled YES, appt 01/08/18  Provider Appointment booked with Dr. Ronnald Ramp   Confirmed with patient if condition begins to worsen call PCP or go to the ER.  Patient was given the office number and encouraged to call back with question or concerns.  : YES

## 2018-01-02 ENCOUNTER — Other Ambulatory Visit: Payer: Self-pay

## 2018-01-02 ENCOUNTER — Ambulatory Visit (INDEPENDENT_AMBULATORY_CARE_PROVIDER_SITE_OTHER): Payer: Medicare Other | Admitting: Obstetrics & Gynecology

## 2018-01-02 ENCOUNTER — Encounter: Payer: Self-pay | Admitting: Obstetrics & Gynecology

## 2018-01-02 VITALS — BP 118/72 | HR 84 | Resp 18 | Ht 66.0 in | Wt 157.0 lb

## 2018-01-02 DIAGNOSIS — Z01419 Encounter for gynecological examination (general) (routine) without abnormal findings: Secondary | ICD-10-CM | POA: Diagnosis not present

## 2018-01-02 DIAGNOSIS — Z124 Encounter for screening for malignant neoplasm of cervix: Secondary | ICD-10-CM | POA: Diagnosis not present

## 2018-01-02 NOTE — Progress Notes (Signed)
83 y.o. [name] MarriedCaucasianF here for annual exam.  Was in the hospital last week due to COPD exacerbation.  Spent two nights in the hospital.  Was discharged with azithromycin and steroids.    Denies vaginal bleeding.    Patient's last menstrual period was 12/03/2002.          Sexually active: No.  The current method of family planning is tubal ligation.    Exercising: No.  The patient does not participate in regular exercise at present. Smoker:  no  Health Maintenance: Pap:  09/27/16 Neg  History of abnormal Pap:  no MMG:  08/19/17 BIRADS1:Neg  Colonoscopy:  2014 polyps. No f/u needed.  BMD:   2015 TDaP:  2017 Pneumonia vaccine(s):  2015 Shingrix:   Zostavax 2009 Hep C testing: n/a Screening Labs: PCP   reports that she quit smoking about 14 years ago. Her smoking use included cigarettes. She has a 40.00 pack-year smoking history. She has never used smokeless tobacco. She reports that she does not drink alcohol or use drugs.  Past Medical History:  Diagnosis Date  . Arthritis   . COPD (chronic obstructive pulmonary disease) ([place])   . Dyspnea on exertion   . GERD (gastroesophageal reflux disease)   . Glaucoma of both eyes   . History of atrial fibrillation without current medication 2010   POST SURG 2010--  PER PT NO ISSUES SINCE  . History of breast cancer    DX DCIS IN 2004--  S/P RIGHT MASTECTOMY , NO CHEMORADIATION---  NO RECURRENCE  . History of CHF (congestive heart failure)    POST SURG 2010  . History of hypertension   . History of shingles    02/ 2015--  back of neck and left flank--  no residual pain  . ICH (intracerebral hemorrhage) ([place]) 02/05/2015  . Nephrolithiasis   . 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 CATARACT EXTRACTION W/ INTRAOCULAR LENS  IMPLANT, BILATERAL    . CRANIECTOMY N/A  02/05/2015   Procedure: SUBOCCIPITAL CRANIECTOMY EVACUATION OF HEMATOMA;  Surgeon: Consuella Lose, MD;  Location: [facility];  Service: Neurosurgery;  Laterality: N/A;  . CYSTOSCOPY W/ RETROGRADES Bilateral 08/11/2014   Procedure: CYSTOSCOPY WITH BILATERAL RETROGRADE PYELOGRAM AND MITOMYCIN INSTILLATION;  Surgeon: Alexis Frock, MD;  Location: [facility];  Service: Urology;  Laterality: Bilateral;  . CYSTOSCOPY W/ URETERAL STENT PLACEMENT Bilateral 03/26/2013   Procedure: CYSTOSCOPY WITH BILATERAL RETROGRADE PYELOGRAM/URETERAL STENT PLACEMENT;  Surgeon: Alexis Frock, MD;  Location: [facility];  Service: Urology;  Laterality: Bilateral;  . CYSTOSCOPY W/ URETERAL STENT PLACEMENT Left 05/13/2013   Procedure: CYSTOSCOPY WITH RETROGRADE PYELOGRAM/URETERAL STENT PLACEMENT STENT EXCHANGE;  Surgeon: Alexis Frock, MD;  Location: [facility];  Service: Urology;  Laterality: Left;  . PARTIAL MASTECTOMY WITH NEEDLE LOCALIZATION Right 01-14-2003   DCIS  . REMOVAL CYST LEFT HAND  2013  . TOTAL KNEE ARTHROPLASTY Right 03-22-2009  . TOTAL MASTECTOMY Right 02-03-2003   W/ SLN BX  AND  POST 03-01-2003 EVACUATION HEMATOMA  . TRANSTHORACIC ECHOCARDIOGRAM  03-25-2009   MILD LVF/ EF 70-80%/ MILD INCREASE SYSTOLIC PULMONARY  PRESSURE  . TRANSURETHRAL RESECTION OF BLADDER TUMOR WITH GYRUS (TURBT-GYRUS) N/A 03/26/2013   Procedure: TRANSURETHRAL RESECTION OF BLADDER TUMOR WITH GYRUS (TURBT-GYRUS);  Surgeon: Alexis Frock, MD;  Location: [facility];  Service:  Urology;  Laterality: N/A;  . TRANSURETHRAL RESECTION OF BLADDER TUMOR WITH GYRUS (TURBT-GYRUS) N/A 05/13/2013   Procedure: TRANSURETHRAL RESECTION OF BLADDER TUMOR WITH GYRUS (TURBT-GYRUS)  RE-STAGING TRANSURETHRAL RESECTION OF BLADDER TUMOR, LEFT RETROGRADE PYELOGRAM AND STENT EXCHANGE;  Surgeon: Alexis Frock, MD;  Location: [facility];  Service: Urology;  Laterality: N/A;  .  TRANSURETHRAL RESECTION OF BLADDER TUMOR WITH GYRUS (TURBT-GYRUS) N/A 08/11/2014   Procedure: TRANSURETHRAL RESECTION OF BLADDER TUMOR WITH GYRUS (TURBT-GYRUS);  Surgeon: Alexis Frock, MD;  Location[facility];  Service: Urology;  Laterality: N/A;  . TUBAL LIGATION      Current Outpatient Medications  Medication Sig Dispense Refill  . albuterol (PROVENTIL HFA;VENTOLIN HFA) 108 (90 Base) MCG/ACT inhaler Inhale 2 puffs into the lungs every 6 (six) hours as needed for wheezing or shortness of breath. 1 Inhaler 2  . atorvastatin (LIPITOR) 10 MG tablet Take 1 tablet (10 mg total) by mouth daily at 6 PM. 90 tablet 1  . DIGOX 125 MCG tablet TAKE ONE TABLET BY MOUTH ONCE DAILY 90 tablet 2  . diltiazem (CARDIZEM CD) 240 MG 24 hr capsule TAKE ONE CAPSULE EACH DAY 90 capsule 0  . guaiFENesin (MUCINEX) 600 MG 12 hr tablet Take 1 tablet (600 mg total) by mouth 2 (two) times daily. 30 tablet 1  . latanoprost (XALATAN) 0.005 % ophthalmic solution Place 1 drop into both eyes at bedtime. Reported on 11/09/2015    . pantoprazole (PROTONIX) 40 MG tablet TAKE ONE TABLET BY MOUTH ONCE DAILY 90 tablet 1  . SIMBRINZA 1-0.2 % SUSP Place 1 drop into both eyes 2 (two) times daily.   99  . Spacer/Aero-Holding Chambers (AEROCHAMBER PLUS) inhaler Use as instructed 1 each 2  . tiotropium (SPIRIVA HANDIHALER) 18 MCG inhalation capsule Place 1 capsule (18 mcg total) into inhaler and inhale daily. 30 capsule 11   No current facility-administered medications for this visit.     Family History  Problem Relation Age of Onset  . Colon cancer Mother 60  . Colon cancer Maternal Grandmother   . Stroke Maternal Grandmother   . Breast cancer Maternal Aunt 84  . Cancer Maternal Aunt        ? ovarian  . Breast cancer Other 60       breast cancer, ovarian cancer  . Breast cancer Daughter 39  . Stroke Paternal Grandmother   . Heart attack Neg Hx   . Hypertension Neg Hx     Review of Systems  All other  systems reviewed and are negative.   Exam:   BP 118/72 (BP Location: Right Arm, Patient Position: Sitting, Cuff Size: Normal)   Pulse 84   Resp 18   Ht _0  (1.676 m)   Wt 157 lb (71.2 kg)   LMP 12/03/2002   BMI 25.34 kg/m    Height: _1  (167.6 cm)  Ht Readings from Last 3 Encounters:  01/02/18 _2  (1.676 m)  12/26/17 _3  (1.702 m)  06/17/17 5' 5.5" (1.664 m)    General appearance: alert, cooperative and appears stated age Head: Normocephalic, without obvious abnormality, atraumatic Neck: no adenopathy, supple, symmetrical, trachea midline and thyroid normal to inspection and palpation Lungs: clear to auscultation bilaterally Breasts: absent right breast with well healed scars and no LAD or masses, left breast without masses, skin changes, LAD, nipple discharge Heart: regular rate and rhythm Abdomen: soft, non-tender; bowel sounds normal; no masses,  no organomegaly Extremities: extremities normal, atraumatic, no cyanosis or edema  Skin: Skin color, texture, turgor normal. No rashes or lesions Lymph nodes: Cervical, supraclavicular, and axillary nodes normal. No abnormal inguinal nodes palpated Neurologic: Grossly normal   Pelvic: External genitalia:  no lesions              Urethra:  normal appearing urethra with no masses, tenderness or lesions              Bartholins and Skenes: normal                 Vagina: normal appearing vagina with normal color and discharge, no lesions              Cervix: no lesions              Pap taken: No. Bimanual Exam:  Uterus:  normal size, contour, position, consistency, mobility, non-tender              Adnexa: normal adnexa and no mass, fullness, tenderness               Rectovaginal: Confirms               Anus:  normal sphincter tone, no lesions  Chaperone was present for exam.  A:  Well Woman with normal exam PMP, no HRT H/O bladder cancer 2014 followed by Dr. Tammi Klippel H/O CHF, a fib, COPD Glaucoma H/o DCIS s/p right  mastectomy 4/04 Family hx of breast cancer with daughter who had genetic testing that was negative H/O cerebellar hemorrhage with hematoma evacuation with associated stroke 6/16 Afib with RVR  P:   Mammogram guidelines reviewed.  Doing yearly due to hx of breast cancer pap smear not indicated.  Negative 2018. Lab work done with Dr. Ronnald Ramp Vaccines UTD.  Does yearly flu vaccinations. return annually or prn

## 2018-01-06 ENCOUNTER — Other Ambulatory Visit: Payer: Self-pay | Admitting: Cardiology

## 2018-01-06 NOTE — Telephone Encounter (Signed)
refill

## 2018-01-07 ENCOUNTER — Telehealth: Payer: Self-pay | Admitting: *Deleted

## 2018-01-07 NOTE — Telephone Encounter (Signed)
Called patient. Left voicemail with info below.  Pulmonologist Dr. Kara Mead.  [place]   [phone]

## 2018-01-08 ENCOUNTER — Encounter: Payer: Self-pay | Admitting: Internal Medicine

## 2018-01-08 ENCOUNTER — Ambulatory Visit (INDEPENDENT_AMBULATORY_CARE_PROVIDER_SITE_OTHER): Payer: Medicare Other | Admitting: Internal Medicine

## 2018-01-08 ENCOUNTER — Telehealth: Payer: Self-pay | Admitting: Internal Medicine

## 2018-01-08 VITALS — BP 100/60 | HR 84 | Temp 97.9°F | Ht 66.0 in | Wt 160.8 lb

## 2018-01-08 DIAGNOSIS — J9611 Chronic respiratory failure with hypoxia: Secondary | ICD-10-CM | POA: Insufficient documentation

## 2018-01-08 DIAGNOSIS — I10 Essential (primary) hypertension: Secondary | ICD-10-CM

## 2018-01-08 DIAGNOSIS — J418 Mixed simple and mucopurulent chronic bronchitis: Secondary | ICD-10-CM

## 2018-01-08 MED ORDER — GLYCOPYRROLATE 25 MCG/ML IN SOLN: 1.0000 | Freq: Two times a day (BID) | RESPIRATORY_TRACT | 11 refills | Status: DC

## 2018-01-08 MED ORDER — BUDESONIDE 0.5 MG/2ML IN SUSP: 0.5000 mg | Freq: Two times a day (BID) | RESPIRATORY_TRACT | 5 refills | Status: DC

## 2018-01-08 MED ORDER — FORMOTEROL FUMARATE 20 MCG/2ML IN NEBU: 20.0000 ug | INHALATION_SOLUTION | Freq: Two times a day (BID) | RESPIRATORY_TRACT | 5 refills | Status: DC

## 2018-01-08 MED ORDER — ARFORMOTEROL TARTRATE 15 MCG/2ML IN NEBU: 15.0000 ug | INHALATION_SOLUTION | Freq: Two times a day (BID) | RESPIRATORY_TRACT | 5 refills | Status: DC

## 2018-01-08 NOTE — Telephone Encounter (Signed)
Patient stated she would like to be notified as to what the new pharmacy will be.

## 2018-01-08 NOTE — Progress Notes (Signed)
Subjective:  Patient ID: [name], female    DOB: November 28, 1934  Age: 83 y.o. MRN: [id number]  CC: COPD   HPI [name] presents for f/up after recent admission for severe exacerbation of COPD.  She has completed a course of antibiotics and steroids.  She is feeling better but continues to complain of nonproductive cough, wheezing, shortness of breath, and DOE.  She has been using an albuterol inhaler which helps some but not much.  She has a prescription for Spiriva but she said she is not using it.  Outpatient Medications Prior to Visit  Medication Sig Dispense Refill  . albuterol (PROVENTIL HFA;VENTOLIN HFA) 108 (90 Base) MCG/ACT inhaler Inhale 2 puffs into the lungs every 6 (six) hours as needed for wheezing or shortness of breath. 1 Inhaler 2  . atorvastatin (LIPITOR) 10 MG tablet Take 1 tablet (10 mg total) by mouth daily at 6 PM. 90 tablet 1  . DIGOX 125 MCG tablet TAKE ONE TABLET BY MOUTH ONCE DAILY 90 tablet 2  . diltiazem (CARDIZEM CD) 240 MG 24 hr capsule TAKE ONE CAPSULE EACH DAY 90 capsule 0  . guaiFENesin (MUCINEX) 600 MG 12 hr tablet Take 1 tablet (600 mg total) by mouth 2 (two) times daily. 30 tablet 1  . latanoprost (XALATAN) 0.005 % ophthalmic solution Place 1 drop into both eyes at bedtime. Reported on 11/09/2015    . pantoprazole (PROTONIX) 40 MG tablet TAKE ONE TABLET BY MOUTH ONCE DAILY 90 tablet 1  . SIMBRINZA 1-0.2 % SUSP Place 1 drop into both eyes 2 (two) times daily.   99  . Spacer/Aero-Holding Chambers (AEROCHAMBER PLUS) inhaler Use as instructed 1 each 2  . tiotropium (SPIRIVA HANDIHALER) 18 MCG inhalation capsule Place 1 capsule (18 mcg total) into inhaler and inhale daily. 30 capsule 11   No facility-administered medications prior to visit.     ROS Review of Systems  Constitutional: Negative.  Negative for chills, diaphoresis, fatigue and fever.  HENT: Negative.  Negative for sinus pressure and sore throat.   Eyes: Negative.   Respiratory:  Positive for cough, shortness of breath and wheezing. Negative for choking, chest tightness and stridor.   Cardiovascular: Negative for chest pain, palpitations and leg swelling.  Gastrointestinal: Negative.  Negative for abdominal pain, diarrhea, nausea and vomiting.  Endocrine: Negative.   Genitourinary: Negative.  Negative for difficulty urinating and dysuria.  Musculoskeletal: Negative.  Negative for arthralgias and myalgias.  Skin: Negative.  Negative for color change.  Neurological: Negative.  Negative for dizziness, weakness and light-headedness.  Hematological: Negative for adenopathy. Does not bruise/bleed easily.  Psychiatric/Behavioral: Negative.     Objective:  BP 100/60 (BP Location: Left Arm, Patient Position: Sitting, Cuff Size: Normal)   Pulse 84   Temp 97.9 F (36.6 C) (Oral)   Ht 5' 6" (1.676 m)   Wt 160 lb 12 oz (72.9 kg)   LMP 12/03/2002   SpO2 99%   BMI 25.95 kg/m   BP Readings from Last 3 Encounters:  01/08/18 100/60  01/02/18 118/72  12/28/17 121/67    Wt Readings from Last 3 Encounters:  01/08/18 160 lb 12 oz (72.9 kg)  01/02/18 157 lb (71.2 kg)  12/26/17 160 lb (72.6 kg)    Physical Exam  Constitutional: She is oriented to person, place, and time. Vital signs are normal.  Non-toxic appearance. She does not have a sickly appearance. She appears ill. She appears distressed.  Tachypneic with pursed lips breathing  HENT:  Mouth/Throat:  No oropharyngeal exudate.  Eyes: Conjunctivae are normal. No scleral icterus.  Neck: Normal range of motion. Neck supple. No JVD present. No thyromegaly present.  Cardiovascular: Normal rate, regular rhythm and normal heart sounds. Exam reveals no gallop and no friction rub.  No murmur heard. Pulmonary/Chest: Accessory muscle usage present. Tachypnea noted. No apnea and no bradypnea. No respiratory distress. She has decreased breath sounds in the right upper field, the right middle field, the right lower field, the left  upper field, the left middle field and the left lower field. She has no wheezes. She has rhonchi in the right upper field and the left upper field. She has no rales.  Abdominal: Soft. Bowel sounds are normal. She exhibits no mass. There is no tenderness.  Musculoskeletal: Normal range of motion. She exhibits no tenderness or deformity.  Lymphadenopathy:    She has no cervical adenopathy.  Neurological: She is alert and oriented to person, place, and time.  Skin: Skin is warm and dry.  Psychiatric: She has a normal mood and affect. Her behavior is normal. Judgment and thought content normal.  Vitals reviewed.   Lab Results  Component Value Date   WBC 9.0 12/27/2017   HGB 13.4 12/27/2017   HCT 42.5 12/27/2017   PLT 219 12/27/2017   GLUCOSE 96 12/28/2017   CHOL 121 04/19/2017   TRIG 114 04/19/2017   HDL 46 04/19/2017   LDLCALC 52 04/19/2017   ALT 17 04/19/2017   AST 20 04/19/2017   NA 141 12/28/2017   K 4.5 12/28/2017   CL 101 12/28/2017   CREATININE 0.91 12/28/2017   BUN 22 (H) 12/28/2017   CO2 31 12/28/2017   TSH 0.632 12/26/2017   INR 1.06 02/05/2015   HGBA1C 5.9 (H) 12/27/2017    Dg Chest 2 View  Result Date: 12/26/2017 CLINICAL DATA:  Dyspnea beginning last evening. EXAM: CHEST - 2 VIEW COMPARISON:  05/13/2017 FINDINGS: Mild cardiac enlargement with aortic atherosclerosis. No aortic aneurysm. Lungs are hyperinflated consistent COPD. Chronic interstitial prominence with slight blunting of the costophrenic angles possibly from pleural thickening or trace effusions. Chronic bronchitic change of the lungs. No alveolar consolidation. Biapical pleuroparenchymal thickening and/or scarring. No acute osseous abnormality. Degenerative changes are present along the dorsal spine. IMPRESSION: Borderline cardiomegaly with aortic atherosclerosis. Hyperinflated lungs consistent with COPD. Chronic bronchitic change probable trace posterior pleural effusions. Electronically Signed   By: Ashley Royalty M.D.   On: 12/26/2017 17:59    Assessment & Plan:   Dajane was seen today for copd.  Diagnoses and all orders for this visit:  Benign essential HTN  Chronic respiratory failure with hypoxia ([place])- Her resting pulse ox today was 77% on room air.  We gave her 2 L via nasal cannula and the pulse ox increased to 94%.  I have sent an urgent referral for [facility] to set her up with continuous oxygen therapy. -     Ambulatory referral to [place]: Glycopyrrolate (LONHALA MAGNAIR REFILL KIT) 25 MCG/ML SOLN; Inhale 1 puff into the lungs 2 (two) times daily. -     Discontinue: Glycopyrrolate (LONHALA MAGNAIR STARTER KIT) 25 MCG/ML SOLN; Inhale 1 puff into the lungs 2 (two) times daily. -     Ambulatory referral to Pulmonology -     Glycopyrrolate (LONHALA MAGNAIR STARTER KIT) 25 MCG/ML SOLN; Inhale 1 puff into the lungs 2 (two) times daily. -     Glycopyrrolate (LONHALA MAGNAIR REFILL KIT) 25 MCG/ML SOLN; Inhale  1 puff into the lungs 2 (two) times daily. -     arformoterol (BROVANA) 15 MCG/2ML NEBU; Take 2 mLs (15 mcg total) by nebulization 2 (two) times daily.  Mixed simple and mucopurulent chronic bronchitis ([place])- She has had a recent, severe exacerbation and continues to have symptoms.  I gave her a sample of the Trelegy inhaler today and tried to show her how to use it but she was not able to use it properly.  I therefore think she should be upgraded to nebulized therapy.  I have asked her to start using a LAMA/LABA/ICS as well as the as needed albuterol. -     Ambulatory referral to [place]: Glycopyrrolate (LONHALA MAGNAIR REFILL KIT) 25 MCG/ML SOLN; Inhale 1 puff into the lungs 2 (two) times daily. -     Discontinue: Glycopyrrolate (LONHALA MAGNAIR STARTER KIT) 25 MCG/ML SOLN; Inhale 1 puff into the lungs 2 (two) times daily. -     budesonide (PULMICORT) 0.5 MG/2ML nebulizer solution; Take 2 mLs (0.5 mg total) by nebulization 2 (two) times  daily. -     Ambulatory referral to Pulmonology -     Glycopyrrolate (LONHALA MAGNAIR STARTER KIT) 25 MCG/ML SOLN; Inhale 1 puff into the lungs 2 (two) times daily. -     Glycopyrrolate (LONHALA MAGNAIR REFILL KIT) 25 MCG/ML SOLN; Inhale 1 puff into the lungs 2 (two) times daily. -     arformoterol (BROVANA) 15 MCG/2ML NEBU; Take 2 mLs (15 mcg total) by nebulization 2 (two) times daily.   I have discontinued Foy [place]. Steil's tiotropium. I am also having her start on budesonide and arformoterol. Additionally, I am having her maintain her latanoprost, SIMBRINZA, DIGOX, pantoprazole, atorvastatin, guaiFENesin, albuterol, AEROCHAMBER PLUS, diltiazem, Glycopyrrolate, and Glycopyrrolate.  Meds ordered this encounter  Medications  . DISCONTD: Glycopyrrolate (LONHALA MAGNAIR REFILL KIT) 25 MCG/ML SOLN    Sig: Inhale 1 puff into the lungs 2 (two) times daily.    Dispense:  60 mL    Refill:  11  . DISCONTD: Glycopyrrolate (LONHALA MAGNAIR STARTER KIT) 25 MCG/ML SOLN    Sig: Inhale 1 puff into the lungs 2 (two) times daily.    Dispense:  60 mL    Refill:  11  . budesonide (PULMICORT) 0.5 MG/2ML nebulizer solution    Sig: Take 2 mLs (0.5 mg total) by nebulization 2 (two) times daily.    Dispense:  120 mL    Refill:  5  . Glycopyrrolate (LONHALA MAGNAIR STARTER KIT) 25 MCG/ML SOLN    Sig: Inhale 1 puff into the lungs 2 (two) times daily.    Dispense:  60 mL    Refill:  11  . Glycopyrrolate (LONHALA MAGNAIR REFILL KIT) 25 MCG/ML SOLN    Sig: Inhale 1 puff into the lungs 2 (two) times daily.    Dispense:  60 mL    Refill:  11  . arformoterol (BROVANA) 15 MCG/2ML NEBU    Sig: Take 2 mLs (15 mcg total) by nebulization 2 (two) times daily.    Dispense:  120 mL    Refill:  5     Follow-up: No follow-ups on file.  [name], MD

## 2018-01-08 NOTE — Telephone Encounter (Signed)
PCP has changed the rx to perforomist. Left detailed message for pt informing of same and stated that we samples here at the office ready for her.

## 2018-01-08 NOTE — Telephone Encounter (Signed)
[name] called and they do not and will not ever carry  arformoterol (BROVANA) 15 MCG/2ML NEBU Please send to new pharmacy

## 2018-01-09 ENCOUNTER — Telehealth: Payer: Self-pay | Admitting: Internal Medicine

## 2018-01-09 MED ORDER — ARFORMOTEROL TARTRATE 15 MCG/2ML IN NEBU: 15.0000 ug | INHALATION_SOLUTION | Freq: Two times a day (BID) | RESPIRATORY_TRACT | 5 refills | Status: DC

## 2018-01-09 NOTE — Telephone Encounter (Addendum)
Contacted Caryl Pina [phone]) and oxygen order has been added.   Caryl Pina stated that they are able to get the Garlon Hatchet if that is the preferred medication.   Fax: [phone] ([place])  Will contact Caryl Pina when OV note is done.

## 2018-01-09 NOTE — Addendum Note (Signed)
Addended by: Aviva Signs M on: 01/09/2018 01:57 PM   Modules accepted: Orders

## 2018-01-09 NOTE — Progress Notes (Signed)
SATURATION QUALIFICATIONS: (This note is used to comply with regulatory documentation for home oxygen)  Patient Saturations on Room Air at Rest = 77%  Patient Saturations on Room Air while Ambulating = This was not performed due to patients condition at the time she arrived to the room.   Patient Saturations on 2 Liters of oxygen while Ambulating = 99%  Please briefly explain why patient needs home oxygen:

## 2018-01-09 NOTE — Telephone Encounter (Signed)
lvm for Caryl Pina to call back.

## 2018-01-09 NOTE — Telephone Encounter (Signed)
Copied from [place] #[id number]. Topic: Quick Communication - See Telephone Encounter >> Jan 09, 2018  9:09 AM Robina Ade, Helene Kelp D wrote: CRM for notification. See Telephone encounter for: 01/09/18. Caryl Pina from [place] called and would like a call back from [place] regarding a oxygen tank for patient, if she can please call him back at [phone].

## 2018-01-14 NOTE — Telephone Encounter (Signed)
Faxed order for [place] [facility] to dispense the Brovana nebulizing solution.

## 2018-01-15 ENCOUNTER — Telehealth: Payer: Self-pay | Admitting: Internal Medicine

## 2018-01-15 NOTE — Telephone Encounter (Signed)
Copied from [place] [phone]. Topic: Quick Communication - See Telephone Encounter >> Jan 15, 2018  1:31 PM Aurelio Brash B wrote: CRM for notification. See Telephone encounter for: 01/15/18.  Brain  from [place] called to say the pts Glycopyrrolate (LONHALA MAGNAIR STARTER KIT) 25 MCG/ML SOLN   approved 1 year from 5/14       [phone] option 5

## 2018-01-24 ENCOUNTER — Other Ambulatory Visit: Payer: Self-pay | Admitting: Cardiology

## 2018-01-24 NOTE — Telephone Encounter (Signed)
Rx request sent to pharmacy.

## 2018-02-03 DIAGNOSIS — H401134 Primary open-angle glaucoma, bilateral, indeterminate stage: Secondary | ICD-10-CM | POA: Diagnosis not present

## 2018-02-03 DIAGNOSIS — H04123 Dry eye syndrome of bilateral lacrimal glands: Secondary | ICD-10-CM | POA: Diagnosis not present

## 2018-02-03 DIAGNOSIS — H52203 Unspecified astigmatism, bilateral: Secondary | ICD-10-CM | POA: Diagnosis not present

## 2018-02-03 DIAGNOSIS — H43813 Vitreous degeneration, bilateral: Secondary | ICD-10-CM | POA: Diagnosis not present

## 2018-02-18 ENCOUNTER — Encounter: Payer: Self-pay | Admitting: Pulmonary Disease

## 2018-02-18 ENCOUNTER — Ambulatory Visit (INDEPENDENT_AMBULATORY_CARE_PROVIDER_SITE_OTHER): Payer: Medicare Other | Admitting: Pulmonary Disease

## 2018-02-18 VITALS — BP 116/70 | HR 65 | Ht 66.0 in | Wt 155.0 lb

## 2018-02-18 DIAGNOSIS — J418 Mixed simple and mucopurulent chronic bronchitis: Secondary | ICD-10-CM

## 2018-02-18 DIAGNOSIS — J9611 Chronic respiratory failure with hypoxia: Secondary | ICD-10-CM

## 2018-02-18 NOTE — Patient Instructions (Addendum)
Take your nebulizer medications-budesonide and Brovana, twice daily.  Lung function is at 37%  Referral to pulmonary rehab

## 2018-02-18 NOTE — Progress Notes (Signed)
Subjective:    Patient ID: [name], female    DOB: 04-Sep-1934, 83 y.o.   MRN: [id number]  HPI  83 year old woman referred for evaluation and management of COPD and chronic respiratory failure. She was hospitalized 4/25 to 4/27 for COPD exacerbation she was treated with bronchodilators, IV steroids and azithromycin with resolution of her hypoxia and wheezing..  She had a follow-up visit with her PCP on 5/8 and was found to have a saturation of 77% on room air.  She was placed on 2 L oxygen , a portable concentrator was provided.  It was also felt that she would not be able to take handheld inhalers when she was changed to a regimen of Pulmicort and brovana nebs.   She was diagnosed with COPD many years ago and has tried other inhaled bronchodilators including Flovent and Trelegy.  She smoked about 40 pack years before she quit in 2004 She has a history of proximal atrial fibrillation not on anticoagulation due to prior history of intracranial hemorrhage in 2016.  She is married and lives with her husband Phyillis Dascoli [place], she has tried exercising but is not very enthusiastic about this.  She admits to having a sedentary lifestyle. She wakes up late around 9 AM and lately has only been using her nebs once daily  Spirometry shows ratio 48, FEV1 37% and FVC 57% consistent with severe obstruction Oxygen saturation was 87% at rest  Past Medical History:  Diagnosis Date  . Arthritis   . COPD (chronic obstructive pulmonary disease) ([place])   . Dyspnea on exertion   . GERD (gastroesophageal reflux disease)   . Glaucoma of both eyes   . History of atrial fibrillation without current medication 2010   POST SURG 2010--  PER PT NO ISSUES SINCE  . History of breast cancer    DX DCIS IN 2004--  S/P RIGHT MASTECTOMY , NO CHEMORADIATION---  NO RECURRENCE  . History of CHF (congestive heart failure)    POST SURG 2010  . History of hypertension   . History of shingles      02/ 2015--  back of neck and left flank--  no residual pain  . ICH (intracerebral hemorrhage) ([place]) 02/05/2015  . Nephrolithiasis   . Recurrent bladder papillary carcinoma ([place]) first dx 06/ 2014   s/p  turbt's  and instillation mitomycin c (chemo)//dx again in 2015-different type      Past Surgical History:  Procedure Laterality Date  . CATARACT EXTRACTION W/ INTRAOCULAR LENS  IMPLANT, BILATERAL    . CRANIECTOMY N/A 02/05/2015   Procedure: SUBOCCIPITAL CRANIECTOMY EVACUATION OF HEMATOMA;  Surgeon: Consuella Lose, MD;  Location: [facility];  Service: Neurosurgery;  Laterality: N/A;  . CYSTOSCOPY W/ RETROGRADES Bilateral 08/11/2014   Procedure: CYSTOSCOPY WITH BILATERAL RETROGRADE PYELOGRAM AND MITOMYCIN INSTILLATION;  Surgeon: Alexis Frock, MD;  Location: [facility];  Service: Urology;  Laterality: Bilateral;  . CYSTOSCOPY W/ URETERAL STENT PLACEMENT Bilateral 03/26/2013   Procedure: CYSTOSCOPY WITH BILATERAL RETROGRADE PYELOGRAM/URETERAL STENT PLACEMENT;  Surgeon: Alexis Frock, MD;  Location: [facility];  Service: Urology;  Laterality: Bilateral;  . CYSTOSCOPY W/ URETERAL STENT PLACEMENT Left 05/13/2013   Procedure: CYSTOSCOPY WITH RETROGRADE PYELOGRAM/URETERAL STENT PLACEMENT STENT EXCHANGE;  Surgeon: Alexis Frock, MD;  Location[facility];  Service: Urology;  Laterality: Left;  . PARTIAL MASTECTOMY WITH NEEDLE LOCALIZATION Right 01-14-2003   DCIS  . REMOVAL CYST LEFT HAND  2013  . TOTAL KNEE  ARTHROPLASTY Right 03-22-2009  . TOTAL MASTECTOMY Right 02-03-2003   W/ SLN BX  AND  POST 03-01-2003 EVACUATION HEMATOMA  . TRANSTHORACIC ECHOCARDIOGRAM  03-25-2009   MILD LVF/ EF 70-80%/ MILD INCREASE SYSTOLIC PULMONARY  PRESSURE  . TRANSURETHRAL RESECTION OF BLADDER TUMOR WITH GYRUS (TURBT-GYRUS) N/A 03/26/2013   Procedure: TRANSURETHRAL RESECTION OF BLADDER TUMOR WITH GYRUS (TURBT-GYRUS);  Surgeon: Alexis Frock, MD;  Location: [facility];  Service: Urology;  Laterality: N/A;  . TRANSURETHRAL RESECTION OF BLADDER TUMOR WITH GYRUS (TURBT-GYRUS) N/A 05/13/2013   Procedure: TRANSURETHRAL RESECTION OF BLADDER TUMOR WITH GYRUS (TURBT-GYRUS)  RE-STAGING TRANSURETHRAL RESECTION OF BLADDER TUMOR, LEFT RETROGRADE PYELOGRAM AND STENT EXCHANGE;  Surgeon: Alexis Frock, MD;  Location: [facility];  Service: Urology;  Laterality: N/A;  . TRANSURETHRAL RESECTION OF BLADDER TUMOR WITH GYRUS (TURBT-GYRUS) N/A 08/11/2014   Procedure: TRANSURETHRAL RESECTION OF BLADDER TUMOR WITH GYRUS (TURBT-GYRUS);  Surgeon: Alexis Frock, MD;  Location: [facility];  Service: Urology;  Laterality: N/A;  . TUBAL LIGATION       Allergies  Allergen Reactions  . Heparin     Previous history of hemorrhagic stroke.  Marland Kitchen Morphine And Related Nausea And Vomiting     Social History   Socioeconomic History  . Marital status: Married    Spouse name: Not on file  . Number of children: Not on file  . Years of education: Not on file  . Highest education level: Not on file  Occupational History  . Not on file  Social Needs  . Financial resource strain: Not on file  . Food insecurity:    Worry: Not on file    Inability: Not on file  . Transportation needs:    Medical: Not on file    Non-medical: Not on file  Tobacco Use  . Smoking status: Former Smoker    Packs/day: 1.00    Years: 40.00    Pack years: 40.00    Types: Cigarettes    Last attempt to quit: 03/20/2003    Years since quitting: 14.9  . Smokeless tobacco: Never Used  Substance and Sexual Activity  . Alcohol use: No    Alcohol/week: 0.0 oz  . Drug use: No  . Sexual activity: Yes    Partners: Male    Birth control/protection: Surgical    Comment: BTL  Lifestyle  . Physical activity:    Days per week: Not on file    Minutes per session: Not on file  . Stress: Not on file  Relationships  . Social connections:    Talks on phone: Not on  file    Gets together: Not on file    Attends religious service: Not on file    Active member of club or organization: Not on file    Attends meetings of clubs or organizations: Not on file    Relationship status: Not on file  . Intimate partner violence:    Fear of current or ex partner: Not on file    Emotionally abused: Not on file    Physically abused: Not on file    Forced sexual activity: Not on file  Other Topics Concern  . Not on file  Social History Narrative  . Not on file     Family History  Problem Relation Age of Onset  . Colon cancer Mother 73  . Colon cancer Maternal Grandmother   . Stroke Maternal Grandmother   . Breast cancer Maternal Aunt 23  . Cancer Maternal Aunt        ?  ovarian  . Breast cancer Other 60       breast cancer, ovarian cancer  . Breast cancer Daughter 79  . Stroke Paternal Grandmother   . Heart attack Neg Hx   . Hypertension Neg Hx       Review of Systems Positive for shortness of breath, wheezing, dry cough  Constitutional: negative for anorexia, fevers and sweats  Eyes: negative for irritation, redness and visual disturbance  Ears, nose, mouth, throat, and face: negative for earaches, epistaxis, nasal congestion and sore throat  Respiratory: negative for sputum and wheezing  Cardiovascular: negative for chest pain, lower extremity edema, orthopnea, palpitations and syncope  Gastrointestinal: negative for abdominal pain, constipation, diarrhea, melena, nausea and vomiting  Genitourinary:negative for dysuria, frequency and hematuria  Hematologic/lymphatic: negative for bleeding, easy bruising and lymphadenopathy  Musculoskeletal:negative for arthralgias, muscle weakness and stiff joints  Neurological: negative for coordination problems, gait problems, headaches and weakness  Endocrine: negative for diabetic symptoms including polydipsia, polyuria and weight loss     Objective:   Physical Exam  Gen. Pleasant,  elderly,well-nourished, in no distress, normal affect ENT - no thrush, no post nasal drip Neck: No JVD, no thyromegaly, no carotid bruits Lungs: no use of accessory muscles, no dullness to percussion, decreased  without rales or rhonchi  Cardiovascular: Rhythm regular, heart sounds  normal, no murmurs or gallops, no peripheral edema Abdomen: soft and non-tender, no hepatosplenomegaly, BS normal. Musculoskeletal: No deformities, no cyanosis or clubbing Neuro:  alert, non focal, no tremors       Assessment & Plan:

## 2018-02-18 NOTE — Assessment & Plan Note (Addendum)
Prognosis her oxygen is unclear at this time.  Seems like she had a gradual decline rather than a clear infective exacerbation which caused her to get on oxygen.  There does not appear to be any indication of CHF or other cause for hypoxia  I have asked her to continue using oxygen during exertion and sleep but okay to stay off while at rest.  We will reassess during her next visit.  If it does seem that she requires long-term oxygen, then she can invest in a lighter POC

## 2018-02-18 NOTE — Addendum Note (Signed)
Addended by: Valerie Salts on: 02/18/2018 03:28 PM   Modules accepted: Orders

## 2018-02-18 NOTE — Assessment & Plan Note (Signed)
Ct nebulizer medications-budesonide and Brovana, twice daily.  Referral to pulmonary rehab

## 2018-02-19 ENCOUNTER — Other Ambulatory Visit: Payer: Self-pay | Admitting: Cardiology

## 2018-02-25 ENCOUNTER — Telehealth (HOSPITAL_COMMUNITY): Payer: Self-pay

## 2018-02-25 NOTE — Telephone Encounter (Signed)
Son of patient returned phone call Janett Billow made and he stated she is currently out of town and would like for [place] to call back on July 8th.

## 2018-02-25 NOTE — Telephone Encounter (Signed)
Attempted to call patient in regards to Pulmonary Rehab - LM on VM °

## 2018-03-13 ENCOUNTER — Telehealth (HOSPITAL_COMMUNITY): Payer: Self-pay

## 2018-03-13 NOTE — Telephone Encounter (Signed)
Attempted to contact patient in regards to Pulmonary Rehab - lm on vm

## 2018-03-20 ENCOUNTER — Telehealth (HOSPITAL_COMMUNITY): Payer: Self-pay

## 2018-03-20 NOTE — Telephone Encounter (Signed)
Called patient to see if she was interested in participating in the Pulmonary Rehab Program. Patient stated yes. Patient will come in for orientation on 04/09/18 @ 1:30PM and will attend the 1:30PM exercise class.  Mailed homework package.

## 2018-03-26 ENCOUNTER — Other Ambulatory Visit: Payer: Self-pay | Admitting: Cardiology

## 2018-03-26 NOTE — Telephone Encounter (Signed)
Rx request sent to pharmacy.

## 2018-04-08 ENCOUNTER — Other Ambulatory Visit: Payer: Self-pay | Admitting: Cardiology

## 2018-04-08 NOTE — Progress Notes (Signed)
HPI: FU atrial fibrillation. Echocardiogram June 2016 showed vigorous LV function, mild left ventricular hypertrophy, grade 1 diastolic dysfunction and mild right atrial enlargement. Patient was admitted in June 2016 with a large right cerebellar hemorrhage. Note her only anticoagulate at that time was aspirin. The hemorrhage required surgical evacuation. Patient developed atrial fibrillation during the hospitalization with difficult to control heart rate. She was felt not to be a candidate for anticoagulation given intracranial hemorrhage. Holter 7/16 showed atrial fibrillation with elevated rate; digoxin added. I previously contacted Dr Kathyrn Sheriff Neurosurgery to see if she would be a candidate for short-term anticoagulation which would then qualify her for a watchman device. He felt this could be pursued. Since last seen,  she has dyspnea on exertion secondary to COPD and is now on home oxygen.  She denies orthopnea, PND, pedal edema, chest pain or syncope.  Current Outpatient Medications  Medication Sig Dispense Refill  . albuterol (PROVENTIL HFA;VENTOLIN HFA) 108 (90 Base) MCG/ACT inhaler Inhale 2 puffs into the lungs every 6 (six) hours as needed for wheezing or shortness of breath. 1 Inhaler 2  . arformoterol (BROVANA) 15 MCG/2ML NEBU Take 2 mLs (15 mcg total) by nebulization 2 (two) times daily. 120 mL 5  . atorvastatin (LIPITOR) 10 MG tablet TAKE ONE TABLET EACH DAY AT 6PM 60 tablet 0  . budesonide (PULMICORT) 0.5 MG/2ML nebulizer solution Take 2 mLs (0.5 mg total) by nebulization 2 (two) times daily. 120 mL 5  . DIGOX 125 MCG tablet TAKE ONE TABLET DAILY 90 tablet 0  . diltiazem (CARDIZEM CD) 240 MG 24 hr capsule TAKE ONE CAPSULE EACH DAY 90 capsule 0  . latanoprost (XALATAN) 0.005 % ophthalmic solution Place 1 drop into both eyes at bedtime. Reported on 11/09/2015    . pantoprazole (PROTONIX) 40 MG tablet TAKE ONE TABLET BY MOUTH ONCE DAILY 90 tablet 1  . SIMBRINZA 1-0.2 % SUSP Place 1  drop into both eyes 2 (two) times daily.   99   No current facility-administered medications for this visit.      Past Medical History:  Diagnosis Date  . Arthritis   . COPD (chronic obstructive pulmonary disease) ([place])   . Dyspnea on exertion   . GERD (gastroesophageal reflux disease)   . Glaucoma of both eyes   . History of atrial fibrillation without current medication 2010   POST SURG 2010--  PER PT NO ISSUES SINCE  . History of breast cancer    DX DCIS IN 2004--  S/P RIGHT MASTECTOMY , NO CHEMORADIATION---  NO RECURRENCE  . History of CHF (congestive heart failure)    POST SURG 2010  . History of hypertension   . History of shingles    02/ 2015--  back of neck and left flank--  no residual pain  . ICH (intracerebral hemorrhage) ([place]) 02/05/2015  . Nephrolithiasis   . Recurrent bladder papillary carcinoma ([place]) first dx 06/ 2014   s/p  turbt's  and instillation mitomycin c (chemo)//dx again in 2015-different type    Past Surgical History:  Procedure Laterality Date  . CATARACT EXTRACTION W/ INTRAOCULAR LENS  IMPLANT, BILATERAL    . CRANIECTOMY N/A 02/05/2015   Procedure: SUBOCCIPITAL CRANIECTOMY EVACUATION OF HEMATOMA;  Surgeon: Consuella Lose, MD;  Location: [facility];  Service: Neurosurgery;  Laterality: N/A;  . CYSTOSCOPY W/ RETROGRADES Bilateral 08/11/2014   Procedure: CYSTOSCOPY WITH BILATERAL RETROGRADE PYELOGRAM AND MITOMYCIN INSTILLATION;  Surgeon: Alexis Frock, MD;  Location: [facility];  Service:  Urology;  Laterality: Bilateral;  . CYSTOSCOPY W/ URETERAL STENT PLACEMENT Bilateral 03/26/2013   Procedure: CYSTOSCOPY WITH BILATERAL RETROGRADE PYELOGRAM/URETERAL STENT PLACEMENT;  Surgeon: Alexis Frock, MD;  Location: [facility];  Service: Urology;  Laterality: Bilateral;  . CYSTOSCOPY W/ URETERAL STENT PLACEMENT Left 05/13/2013   Procedure: CYSTOSCOPY WITH RETROGRADE PYELOGRAM/URETERAL STENT PLACEMENT STENT EXCHANGE;  Surgeon:  Alexis Frock, MD;  Location: [facility];  Service: Urology;  Laterality: Left;  . PARTIAL MASTECTOMY WITH NEEDLE LOCALIZATION Right 01-14-2003   DCIS  . REMOVAL CYST LEFT HAND  2013  . TOTAL KNEE ARTHROPLASTY Right 03-22-2009  . TOTAL MASTECTOMY Right 02-03-2003   W/ SLN BX  AND  POST 03-01-2003 EVACUATION HEMATOMA  . TRANSTHORACIC ECHOCARDIOGRAM  03-25-2009   MILD LVF/ EF 70-80%/ MILD INCREASE SYSTOLIC PULMONARY  PRESSURE  . TRANSURETHRAL RESECTION OF BLADDER TUMOR WITH GYRUS (TURBT-GYRUS) N/A 03/26/2013   Procedure: TRANSURETHRAL RESECTION OF BLADDER TUMOR WITH GYRUS (TURBT-GYRUS);  Surgeon: Alexis Frock, MD;  Location: [facility];  Service: Urology;  Laterality: N/A;  . TRANSURETHRAL RESECTION OF BLADDER TUMOR WITH GYRUS (TURBT-GYRUS) N/A 05/13/2013   Procedure: TRANSURETHRAL RESECTION OF BLADDER TUMOR WITH GYRUS (TURBT-GYRUS)  RE-STAGING TRANSURETHRAL RESECTION OF BLADDER TUMOR, LEFT RETROGRADE PYELOGRAM AND STENT EXCHANGE;  Surgeon: Alexis Frock, MD;  Location: [facility];  Service: Urology;  Laterality: N/A;  . TRANSURETHRAL RESECTION OF BLADDER TUMOR WITH GYRUS (TURBT-GYRUS) N/A 08/11/2014   Procedure: TRANSURETHRAL RESECTION OF BLADDER TUMOR WITH GYRUS (TURBT-GYRUS);  Surgeon: Alexis Frock, MD;  Location: [facility];  Service: Urology;  Laterality: N/A;  . TUBAL LIGATION      Social History   Socioeconomic History  . Marital status: Married    Spouse name: Not on file  . Number of children: Not on file  . Years of education: Not on file  . Highest education level: Not on file  Occupational History  . Not on file  Social Needs  . Financial resource strain: Not on file  . Food insecurity:    Worry: Not on file    Inability: Not on file  . Transportation needs:    Medical: Not on file    Non-medical: Not on file  Tobacco Use  . Smoking status: Former Smoker    Packs/day: 1.00    Years: 40.00    Pack  years: 40.00    Types: Cigarettes    Last attempt to quit: 03/20/2003    Years since quitting: 15.0  . Smokeless tobacco: Never Used  Substance and Sexual Activity  . Alcohol use: No    Alcohol/week: 0.0 standard drinks  . Drug use: No  . Sexual activity: Yes    Partners: Male    Birth control/protection: Surgical    Comment: BTL  Lifestyle  . Physical activity:    Days per week: Not on file    Minutes per session: Not on file  . Stress: Not on file  Relationships  . Social connections:    Talks on phone: Not on file    Gets together: Not on file    Attends religious service: Not on file    Active member of club or organization: Not on file    Attends meetings of clubs or organizations: Not on file    Relationship status: Not on file  . Intimate partner violence:    Fear of current or ex partner: Not on file    Emotionally abused: Not on file    Physically abused:  Not on file    Forced sexual activity: Not on file  Other Topics Concern  . Not on file  Social History Narrative  . Not on file    Family History  Problem Relation Age of Onset  . Colon cancer Mother 31  . Colon cancer Maternal Grandmother   . Stroke Maternal Grandmother   . Breast cancer Maternal Aunt 23  . Cancer Maternal Aunt        ? ovarian  . Breast cancer Other 60       breast cancer, ovarian cancer  . Breast cancer Daughter 60  . Stroke Paternal Grandmother   . Heart attack Neg Hx   . Hypertension Neg Hx     ROS: no fevers or chills, productive cough, hemoptysis, dysphasia, odynophagia, melena, hematochezia, dysuria, hematuria, rash, seizure activity, orthopnea, PND, pedal edema, claudication. Remaining systems are negative.  Physical Exam: Well-developed well-nourished in no acute distress.  Skin is warm and dry.  HEENT is normal.  Neck is supple.  Chest is clear to auscultation with normal expansion.  Cardiovascular exam is irregular Abdominal exam nontender or distended. No masses  palpated. Extremities show no edema. neuro grossly intact  A/P  1 permanent atrial fibrillation-plan to continue Cardizem and digoxin for rate control.  As outlined in previous notes she would benefit from anticoagulation to reduce the risk of embolic event.  However she had a prior intracranial hemorrhage on aspirin.  I contacted neurosurgery previously and it was felt she could be on short-term anticoagulation if she wanted to pursue watchman device.  However patient has declined this and prefers no anticoagulation.  She understands the higher risk of CVA.  2 hypertension-patient's blood pressure is controlled.  Continue present medications.  Check potassium and renal function.  3 hyperlipidemia-continue statin.  Check lipids and liver.  4 chronic diastolic congestive heart failure-patient appears to be euvolemic.   5 COPD-managed by primary care.  Kirk Ruths, MD

## 2018-04-08 NOTE — Telephone Encounter (Signed)
Rx request sent to pharmacy.  

## 2018-04-09 ENCOUNTER — Encounter (HOSPITAL_COMMUNITY): Payer: Self-pay

## 2018-04-09 ENCOUNTER — Encounter (HOSPITAL_COMMUNITY)
Admission: RE | Admit: 2018-04-09 | Discharge: 2018-04-09 | Disposition: A | Discharge status: Home / Self Care | Payer: Medicare Other | Source: Ambulatory Visit | Attending: Pulmonary Disease | Admitting: Pulmonary Disease

## 2018-04-09 VITALS — BP 110/60 | HR 68 | Temp 98.2°F | Resp 18 | Ht 66.5 in | Wt 157.8 lb

## 2018-04-09 DIAGNOSIS — J9611 Chronic respiratory failure with hypoxia: Secondary | ICD-10-CM | POA: Diagnosis not present

## 2018-04-09 NOTE — Progress Notes (Signed)
[name] 83 y.o. female Pulmonary Rehab Orientation Note [place] referred to Pulmonary Rehab s/p Chronic Respiratory Failure with hypoxia by Dr. Elsworth Soho. She arrived accompanied by her daughter, [name] from [place] today in Cardiac and Pulmonary Rehab for orientation to Pulmonary Rehab. She was transported from [employer] via wheel chair. She does carry portable oxygen. Per pt, she uses oxygen occasionally although she is prescribed to use it continuously.  Dr. Elsworth Soho mentioned to her that if she exercises she may be able to come off the oxygen. Color good, skin warm and dry. Patient is oriented to time and place. Patient's medical history, psychosocial health, and medications reviewed. Psychosocial assessment reveals pt lives with their spouse. Pt is currently retired. Pt hobbies include reading and going to the country club. Pt reports her stress level is n/a Pt does not have have any stress or concerns.  Pt does not exhibit signs of depression. PHQ2/9 score 0 /1. Pt shows good  coping skills with positive outlook . Will continue to monitor and evaluate progress toward psychosocial goal(s) of  Continued mental well being. Physical assessment reveals heart rate is normal, breath sounds clear to auscultation, no wheezes, rales, or rhonchi. Grip strength equal, strong. Distal pulses palpable with no swelling. Patient reports she does take medications as prescribed. Patient states *she follows a Regular diet. The patient reports no specific efforts to gain or lose weight. pt would like to lose about 7 pounds. Patient's weight will be monitored closely. Demonstration and practice of PLB using pulse oximeter. Patient able to return demonstration satisfactorily. Safety and hand hygiene in the exercise area reviewed with patient. Patient voices understanding of the information reviewed. Department expectations discussed with patient and achievable goals were set. The patient shows some enthusiasm about  attending the program but really doesn't care to much for exercise. We look forward to working with this nice patient. The patient is scheduled for a 6 min walk test on 8/8 and to begin exercise on 8/20 at 1:30. 45 minutes was spent on a variety of activities such as assessment of the patient, obtaining baseline data including height, weight, BMI, and grip strength, verifying medical history, allergies, and current medications, and teaching patient strategies for performing tasks with less respiratory effort with emphasis on pursed lip breathing [id number] [name], BSN Cardiac and Pulmonary Rehab Nurse Navigator

## 2018-04-10 ENCOUNTER — Encounter (HOSPITAL_COMMUNITY)
Admission: RE | Admit: 2018-04-10 | Discharge: 2018-04-10 | Disposition: A | Discharge status: Home / Self Care | Payer: Medicare Other | Source: Ambulatory Visit | Attending: Pulmonary Disease | Admitting: Pulmonary Disease

## 2018-04-10 DIAGNOSIS — J9611 Chronic respiratory failure with hypoxia: Secondary | ICD-10-CM

## 2018-04-10 NOTE — Progress Notes (Signed)
[name] 83 y.o. female  DOB: 1934/11/06 MRN: [id number]           Nutrition Note 1. Chronic respiratory failure with hypoxia [facility])    Past Medical History:  Diagnosis Date  . Arthritis   . COPD (chronic obstructive pulmonary disease) ([place])   . Dyspnea on exertion   . GERD (gastroesophageal reflux disease)   . Glaucoma of both eyes   . History of atrial fibrillation without current medication 2010   POST SURG 2010--  PER PT NO ISSUES SINCE  . History of breast cancer    DX DCIS IN 2004--  S/P RIGHT MASTECTOMY , NO CHEMORADIATION---  NO RECURRENCE  . History of CHF (congestive heart failure)    POST SURG 2010  . History of hypertension   . History of shingles    02/ 2015--  back of neck and left flank--  no residual pain  . ICH (intracerebral hemorrhage) ([place]) 02/05/2015  . Nephrolithiasis   . Recurrent bladder papillary carcinoma ([place]) first dx 06/ 2014   s/p  turbt's  and instillation mitomycin c (chemo)//dx again in 2015-different type   Meds reviewed. atorvastatin noted  Ht: Ht Readings from Last 1 Encounters:  04/09/18 5' 6.5" (1.689 m)     Wt:  Wt Readings from Last 3 Encounters:  04/09/18 157 lb 13.6 oz (71.6 kg)  02/18/18 155 lb (70.3 kg)  01/08/18 160 lb 12 oz (72.9 kg)     BMI: Body mass index is 25.1 kg/m.    Current tobacco use? No  Labs:  Lipid Panel     Component Value Date/Time   CHOL 121 04/19/2017 0936   TRIG 114 04/19/2017 0936   HDL 46 04/19/2017 0936   CHOLHDL 2.6 04/19/2017 0936   CHOLHDL 3 05/09/2016 1216   VLDL 36.8 05/09/2016 1216   LDLCALC 52 04/19/2017 0936    Lab Results  Component Value Date   HGBA1C 5.9 (H) 12/27/2017    Nutrition Diagnosis ? Excessive sodium intake related to over consumption of processed food as evidenced by frequent consumption of convenience food/ canned vegetables and eating out frequently. ? Food-and nutrition-related knowledge deficit related to lack of exposure to information as related to  diagnosis of pulmonary disease ? Overweight/obesity related to excessive energy intake as evidenced by a BMI of Body mass index is 25.1 kg/m.   Goal(s) 1. Pt to identify and limit food sources of sodium, saturated fat, refined carbohydrates. 2. Identify food quantities necessary to achieve wt loss of  -1# per week to a goal wt loss of 5-7 lb at graduation from pulmonary rehab. 3. Pt to include fruits, vegetables, whole grains, lean protein, and low-fat dairy products in a healthy meal plan. 4. Pt able to name foods that affect blood glucose   Plan:  Pt to attend Pulmonary Nutrition class Will provide client-centered nutrition education as part of interdisciplinary care.    Monitor and Evaluate progress toward nutrition goal with team.   Laurina Bustle, MS, RD, LDN 04/10/2018 1:58 PM

## 2018-04-11 ENCOUNTER — Encounter: Payer: Self-pay | Admitting: Cardiology

## 2018-04-11 ENCOUNTER — Ambulatory Visit (INDEPENDENT_AMBULATORY_CARE_PROVIDER_SITE_OTHER): Payer: Medicare Other | Admitting: Cardiology

## 2018-04-11 VITALS — BP 104/68 | HR 71 | Ht 66.5 in | Wt 158.0 lb

## 2018-04-11 DIAGNOSIS — I5032 Chronic diastolic (congestive) heart failure: Secondary | ICD-10-CM

## 2018-04-11 DIAGNOSIS — E7800 Pure hypercholesterolemia, unspecified: Secondary | ICD-10-CM | POA: Diagnosis not present

## 2018-04-11 DIAGNOSIS — I482 Chronic atrial fibrillation: Secondary | ICD-10-CM

## 2018-04-11 DIAGNOSIS — I4821 Permanent atrial fibrillation: Secondary | ICD-10-CM

## 2018-04-11 DIAGNOSIS — I10 Essential (primary) hypertension: Secondary | ICD-10-CM

## 2018-04-11 NOTE — Patient Instructions (Signed)
Your physician wants you to follow-up in: 6 MONTHS WITH DR CRENSHAW You will receive a reminder letter in the mail two months in advance. If you don't receive a letter, please call our office to schedule the follow-up appointment.   If you need a refill on your cardiac medications before your next appointment, please call your pharmacy.  

## 2018-04-11 NOTE — Progress Notes (Signed)
Pulmonary Individual Treatment Plan  Patient Details  Name: DEBIE ASHLINE MRN: [id number] Date of Birth: 03/25/1935 Referring Provider:     Pulmonary Rehab Walk Test from 04/10/2018 in [place]  Referring Provider  Dr. Elsworth Soho      Initial Encounter Date:    Pulmonary Rehab Walk Test from 04/10/2018 in [place]  Date  04/11/18      Visit Diagnosis: Chronic respiratory failure with hypoxia ([place])  Patient's Home Medications on Admission:   Current Outpatient Medications:  .  albuterol (PROVENTIL HFA;VENTOLIN HFA) 108 (90 Base) MCG/ACT inhaler, Inhale 2 puffs into the lungs every 6 (six) hours as needed for wheezing or shortness of breath., Disp: 1 Inhaler, Rfl: 2 .  arformoterol (BROVANA) 15 MCG/2ML NEBU, Take 2 mLs (15 mcg total) by nebulization 2 (two) times daily., Disp: 120 mL, Rfl: 5 .  atorvastatin (LIPITOR) 10 MG tablet, TAKE ONE TABLET EACH DAY AT 6PM, Disp: 60 tablet, Rfl: 0 .  budesonide (PULMICORT) 0.5 MG/2ML nebulizer solution, Take 2 mLs (0.5 mg total) by nebulization 2 (two) times daily., Disp: 120 mL, Rfl: 5 .  DIGOX 125 MCG tablet, TAKE ONE TABLET DAILY, Disp: 90 tablet, Rfl: 0 .  diltiazem (CARDIZEM CD) 240 MG 24 hr capsule, TAKE ONE CAPSULE EACH DAY, Disp: 90 capsule, Rfl: 0 .  latanoprost (XALATAN) 0.005 % ophthalmic solution, Place 1 drop into both eyes at bedtime. Reported on 11/09/2015, Disp: , Rfl:  .  pantoprazole (PROTONIX) 40 MG tablet, TAKE ONE TABLET BY MOUTH ONCE DAILY, Disp: 90 tablet, Rfl: 1 .  SIMBRINZA 1-0.2 % SUSP, Place 1 drop into both eyes 2 (two) times daily. , Disp: , Rfl: 56  Past Medical History: Past Medical History:  Diagnosis Date  . Arthritis   . COPD (chronic obstructive pulmonary disease) ([place])   . Dyspnea on exertion   . GERD (gastroesophageal reflux disease)   . Glaucoma of both eyes   . History of atrial fibrillation without current medication 2010   POST SURG 2010--   PER PT NO ISSUES SINCE  . History of breast cancer    DX DCIS IN 2004--  S/P RIGHT MASTECTOMY , NO CHEMORADIATION---  NO RECURRENCE  . History of CHF (congestive heart failure)    POST SURG 2010  . History of hypertension   . History of shingles    02/ 2015--  back of neck and left flank--  no residual pain  . ICH (intracerebral hemorrhage) ([place]) 02/05/2015  . Nephrolithiasis   . Recurrent bladder papillary carcinoma ([place]) first dx 06/ 2014   s/p  turbt's  and instillation mitomycin c (chemo)//dx again in 2015-different type    Tobacco Use: Social History   Tobacco Use  Smoking Status Former Smoker  . Packs/day: 1.00  . Years: 40.00  . Pack years: 40.00  . Types: Cigarettes  . Last attempt to quit: 03/20/2003  . Years since quitting: 15.0  Smokeless Tobacco Never Used    Labs: Recent Chemical engineer    Labs for ITP Cardiac and Pulmonary Rehab Latest Ref Rng & Units 07/11/2015 11/09/2015 05/09/2016 04/19/2017 12/27/2017   Cholestrol 100 - 199 mg/dL 125 - 131 121 -   LDLCALC 0 - 99 mg/dL 59 - 48 52 -   HDL >39 mg/dL 48 - 46.80 46 -   Trlycerides 0 - 149 mg/dL 92 - 184.0(H) 114 -   Hemoglobin A1c 4.8 - 5.6 % - 6.1 6.0 - 5.9(H)  TCO2 0 - 100 mmol/L - - - - -      Capillary Blood Glucose: Lab Results  Component Value Date   GLUCAP 85 12/28/2017   GLUCAP 83 12/28/2017   GLUCAP 129 (H) 12/27/2017   GLUCAP 169 (H) 12/27/2017   GLUCAP 145 (H) 02/05/2015     Pulmonary Assessment Scores: Pulmonary Assessment Scores    Row Name 04/11/18 1003         ADL UCSD   ADL Phase  Entry       mMRC Score   mMRC Score  2        Pulmonary Function Assessment:   Exercise Target Goals: Date: 04/11/18  Exercise Program Goal: Individual exercise prescription set using results from initial 6 min walk test and THRR while considering  patient's activity barriers and safety.    Exercise Prescription Goal: Initial exercise prescription builds to 30-45 minutes a day of aerobic  activity, 2-3 days per week.  Home exercise guidelines will be given to patient during program as part of exercise prescription that the participant will acknowledge.  Activity Barriers & Risk Stratification: Activity Barriers & Cardiac Risk Stratification - 04/09/18 1615      Activity Barriers & Cardiac Risk Stratification   Cardiac Risk Stratification  Low       6 Minute Walk: 6 Minute Walk    Row Name 04/11/18 0958         6 Minute Walk   Phase  Initial     Distance  700 feet     Walk Time  6 minutes     # of Rest Breaks  - 1 standing rest break for 30 seconds     MPH  1.33     METS  2     RPE  12     Perceived Dyspnea   2     Comments  used wheelchair/ used forehead probe/blurry vision during 34mt which resided/lost balance around corners due to stroke     Resting HR  68 bpm     Resting BP  104/70     Resting Oxygen Saturation   92 %     Exercise Oxygen Saturation  during 6 min walk  88 %     Max Ex. HR  89 bpm     Max Ex. BP  130/64       Interval HR   1 Minute HR  70     2 Minute HR  78     3 Minute HR  73     4 Minute HR  89     5 Minute HR  82     6 Minute HR  67     Interval Heart Rate?  Yes       Interval Oxygen   Interval Oxygen?  Yes     Baseline Oxygen Saturation %  92 %     1 Minute Oxygen Saturation %  88 %     1 Minute Liters of Oxygen  2 L     2 Minute Oxygen Saturation %  94 %     2 Minute Liters of Oxygen  3 L     3 Minute Oxygen Saturation %  92 %     3 Minute Liters of Oxygen  3 L     4 Minute Oxygen Saturation %  93 %     4 Minute Liters of Oxygen  3 L     5 Minute Oxygen Saturation %  96 %     5 Minute Liters of Oxygen  3 L     6 Minute Oxygen Saturation %  88 %     6 Minute Liters of Oxygen  3 L        Oxygen Initial Assessment: Oxygen Initial Assessment - 04/11/18 0957      Initial 6 min Walk   Oxygen Used  Continuous;E-Tanks    Liters per minute  3      Program Oxygen Prescription   Program Oxygen Prescription   Continuous;E-Tanks    Liters per minute  3    Comments  Needs to use forehead probe since fingers are cold and nails are always painted       Oxygen Re-Evaluation:   Oxygen Discharge (Final Oxygen Re-Evaluation):   Initial Exercise Prescription: Initial Exercise Prescription - 04/11/18 1000      Date of Initial Exercise RX and Referring Provider   Date  04/11/18    Referring Provider  Dr. Elsworth Soho      Oxygen   Oxygen  Continuous    Liters  3      Recumbant Bike   Level  1    RPM  10    Minutes  17      NuStep   Level  1    SPM  80    Minutes  17    METs  1.3      Prescription Details   Frequency (times per week)  2    Duration  Progress to 45 minutes of aerobic exercise without signs/symptoms of physical distress      Intensity   THRR 40-80% of Max Heartrate  56-112    Ratings of Perceived Exertion  11-13    Perceived Dyspnea  0-4      Progression   Progression  Continue progressive overload as per policy without signs/symptoms or physical distress.      Resistance Training   Training Prescription  Yes    Weight  orange bands    Reps  10-15       Perform Capillary Blood Glucose checks as needed.  Exercise Prescription Changes:   Exercise Comments:   Exercise Goals and Review: Exercise Goals    Row Name 04/09/18 1615             Exercise Goals   Increase Physical Activity  Yes       Intervention  Provide advice, education, support and counseling about physical activity/exercise needs.;Develop an individualized exercise prescription for aerobic and resistive training based on initial evaluation findings, risk stratification, comorbidities and participant's personal goals.       Expected Outcomes  Short Term: Attend rehab on a regular basis to increase amount of physical activity.;Long Term: Add in home exercise to make exercise part of routine and to increase amount of physical activity.;Long Term: Exercising regularly at least 3-5 days a week.        Increase Strength and Stamina  Yes       Intervention  Provide advice, education, support and counseling about physical activity/exercise needs.;Develop an individualized exercise prescription for aerobic and resistive training based on initial evaluation findings, risk stratification, comorbidities and participant's personal goals.       Expected Outcomes  Short Term: Increase workloads from initial exercise prescription for resistance, speed, and METs.;Short Term: Perform resistance training exercises routinely during rehab and add in resistance training at home;Long Term: Improve cardiorespiratory fitness, muscular endurance and strength as measured by increased METs  and functional capacity (6MWT)       Able to understand and use rate of perceived exertion (RPE) scale  Yes       Intervention  Provide education and explanation on how to use RPE scale       Expected Outcomes  Short Term: Able to use RPE daily in rehab to express subjective intensity level;Long Term:  Able to use RPE to guide intensity level when exercising independently       Able to understand and use Dyspnea scale  Yes       Intervention  Provide education and explanation on how to use Dyspnea scale       Expected Outcomes  Short Term: Able to use Dyspnea scale daily in rehab to express subjective sense of shortness of breath during exertion;Long Term: Able to use Dyspnea scale to guide intensity level when exercising independently       Knowledge and understanding of Target Heart Rate Range (THRR)  Yes       Intervention  Provide education and explanation of THRR including how the numbers were predicted and where they are located for reference       Expected Outcomes  Short Term: Able to state/look up THRR;Short Term: Able to use daily as guideline for intensity in rehab;Long Term: Able to use THRR to govern intensity when exercising independently       Understanding of Exercise Prescription  Yes       Intervention  Provide  education, explanation, and written materials on patient's individual exercise prescription       Expected Outcomes  Short Term: Able to explain program exercise prescription;Long Term: Able to explain home exercise prescription to exercise independently          Exercise Goals Re-Evaluation :   Discharge Exercise Prescription (Final Exercise Prescription Changes):   Nutrition:  Target Goals: Understanding of nutrition guidelines, daily intake of sodium <1572m, cholesterol <208m calories 30% from fat and 7% or less from saturated fats, daily to have 5 or more servings of fruits and vegetables.  Biometrics:    Nutrition Therapy Plan and Nutrition Goals: Nutrition Therapy & Goals - 04/10/18 1403      Nutrition Therapy   Diet  consistent carbohydrate heart healthy      Personal Nutrition Goals   Nutrition Goal  Pt to identify and limit food sources of sodium, saturated fat, refined carbohydrates    Personal Goal #2  Identify food quantities necessary to achieve wt loss of  -1# per week to a goal wt loss of 5-7 lb at graduation from pulmonary rehab.    Personal Goal #3  Pt able to name foods that affect blood glucose       Intervention Plan   Intervention  Prescribe, educate and counsel regarding individualized specific dietary modifications aiming towards targeted core components such as weight, hypertension, lipid management, diabetes, heart failure and other comorbidities.    Expected Outcomes  Short Term Goal: Understand basic principles of dietary content, such as calories, fat, sodium, cholesterol and nutrients.       Nutrition Assessments: Nutrition Assessments - 04/10/18 1404      Rate Your Plate Scores   Pre Score  43       Nutrition Goals Re-Evaluation:   Nutrition Goals Discharge (Final Nutrition Goals Re-Evaluation):   Psychosocial: Target Goals: Acknowledge presence or absence of significant depression and/or stress, maximize coping skills, provide  positive support system. Participant is able to verbalize types and ability to  use techniques and skills needed for reducing stress and depression.  Initial Review & Psychosocial Screening: Initial Psych Review & Screening - 04/09/18 1611      Family Dynamics   Comments  pt with no identifiable barriers.  Pt denes any stress or anxiety       Quality of Life Scores:  Scores of 19 and below usually indicate a poorer quality of life in these areas.  A difference of  2-3 points is a clinically meaningful difference.  A difference of 2-3 points in the total score of the Quality of Life Index has been associated with significant improvement in overall quality of life, self-image, physical symptoms, and general health in studies assessing change in quality of life.   PHQ-9: Recent Review Flowsheet Data    Depression screen [facility] 2/9 04/09/2018 12/27/2017 11/12/2015 05/20/2015 04/22/2015   Decreased Interest 0 0 0 0 0   Down, Depressed, Hopeless 0 0 0 0 0   PHQ - 2 Score 0 0 0 0 0   Altered sleeping 0 - - - 1   Tired, decreased energy 1 - - - 0   Change in appetite 0 - - - 0   Feeling bad or failure about yourself  0 - - - 0   Trouble concentrating 0 - - - 0   Moving slowly or fidgety/restless 0 - - - 0   Suicidal thoughts 0 - - - 0   PHQ-9 Score 1 - - - 1   Difficult doing work/chores Not difficult at all - - - -     Interpretation of Total Score  Total Score Depression Severity:  1-4 = Minimal depression, 5-9 = Mild depression, 10-14 = Moderate depression, 15-19 = Moderately severe depression, 20-27 = Severe depression   Psychosocial Evaluation and Intervention: Psychosocial Evaluation - 04/09/18 1614      Psychosocial Evaluation & Interventions   Interventions  Stress management education;Relaxation education;Encouraged to exercise with the program and follow exercise prescription    Comments  Pt has positive and healthy coping skills with supportive family.    Expected Outcomes  Pt will  continue to demonstrate positive coping skills and healthy outlook.    Continue Psychosocial Services   Follow up required by staff       Psychosocial Re-Evaluation:   Psychosocial Discharge (Final Psychosocial Re-Evaluation):   Education: Education Goals: Education classes will be provided on a weekly basis, covering required topics. Participant will state understanding/return demonstration of topics presented.  Learning Barriers/Preferences: Learning Barriers/Preferences - 04/09/18 1440      Learning Barriers/Preferences   Learning Barriers  Sight    Learning Preferences  Individual Instruction;Group Instruction;Verbal Instruction;Written Material       Education Topics: Risk Factor Reduction:  -Group instruction that is supported by a PowerPoint presentation. Instructor discusses the definition of a risk factor, different risk factors for pulmonary disease, and how the heart and lungs work together.     Nutrition for Pulmonary Patient:  -Group instruction provided by PowerPoint slides, verbal discussion, and written materials to support subject matter. The instructor gives an explanation and review of healthy diet recommendations, which includes a discussion on weight management, recommendations for fruit and vegetable consumption, as well as protein, fluid, caffeine, fiber, sodium, sugar, and alcohol. Tips for eating when patients are short of breath are discussed.   Pursed Lip Breathing:  -Group instruction that is supported by demonstration and informational handouts. Instructor discusses the benefits of pursed lip and diaphragmatic breathing and detailed  demonstration on how to preform both.     Oxygen Safety:  -Group instruction provided by PowerPoint, verbal discussion, and written material to support subject matter. There is an overview of "What is Oxygen" and "Why do we need it".  Instructor also reviews how to create a safe environment for oxygen use, the importance  of using oxygen as prescribed, and the risks of noncompliance. There is a brief discussion on traveling with oxygen and resources the patient may utilize.   Oxygen Equipment:  -Group instruction provided by [facility] Staff utilizing handouts, written materials, and equipment demonstrations.   Signs and Symptoms:  -Group instruction provided by written material and verbal discussion to support subject matter. Warning signs and symptoms of infection, stroke, and heart attack are reviewed and when to call the physician/911 reinforced. Tips for preventing the spread of infection discussed.   Advanced Directives:  -Group instruction provided by verbal instruction and written material to support subject matter. Instructor reviews Advanced Directive laws and proper instruction for filling out document.   Pulmonary Video:  -Group video education that reviews the importance of medication and oxygen compliance, exercise, good nutrition, pulmonary hygiene, and pursed lip and diaphragmatic breathing for the pulmonary patient.   Exercise for the Pulmonary Patient:  -Group instruction that is supported by a PowerPoint presentation. Instructor discusses benefits of exercise, core components of exercise, frequency, duration, and intensity of an exercise routine, importance of utilizing pulse oximetry during exercise, safety while exercising, and options of places to exercise outside of rehab.     Pulmonary Medications:  -Verbally interactive group education provided by instructor with focus on inhaled medications and proper administration.   Anatomy and Physiology of the Respiratory System and Intimacy:  -Group instruction provided by PowerPoint, verbal discussion, and written material to support subject matter. Instructor reviews respiratory cycle and anatomical components of the respiratory system and their functions. Instructor also reviews differences in obstructive and restrictive respiratory  diseases with examples of each. Intimacy, Sex, and Sexuality differences are reviewed with a discussion on how relationships can change when diagnosed with pulmonary disease. Common sexual concerns are reviewed.   MD DAY -A group question and answer session with a medical doctor that allows participants to ask questions that relate to their pulmonary disease state.   OTHER EDUCATION -Group or individual verbal, written, or video instructions that support the educational goals of the pulmonary rehab program.   Holiday Eating Survival Tips:  -Group instruction provided by PowerPoint slides, verbal discussion, and written materials to support subject matter. The instructor gives patients tips, tricks, and techniques to help them not only survive but enjoy the holidays despite the onslaught of food that accompanies the holidays.   Knowledge Questionnaire Score:   Core Components/Risk Factors/Patient Goals at Admission: Personal Goals and Risk Factors at Admission - 04/09/18 1436      Core Components/Risk Factors/Patient Goals on Admission    Weight Management  Yes;Weight Loss    Intervention  Weight Management: Develop a combined nutrition and exercise program designed to reach desired caloric intake, while maintaining appropriate intake of nutrient and fiber, sodium and fats, and appropriate energy expenditure required for the weight goal.;Weight Management: Provide education and appropriate resources to help participant work on and attain dietary goals.;Weight Management/Obesity: Establish reasonable short term and long term weight goals.    Admit Weight  157 lb 13.6 oz (71.6 kg)    Goal Weight: Short Term  153 lb (69.4 kg)    Goal Weight: Long Term  150 lb (68 kg)    Expected Outcomes  Short Term: Continue to assess and modify interventions until short term weight is achieved;Long Term: Adherence to nutrition and physical activity/exercise program aimed toward attainment of established  weight goal;Weight Loss: Understanding of general recommendations for a balanced deficit meal plan, which promotes 1-2 lb weight loss per week and includes a negative energy balance of [phone] kcal/d;Understanding recommendations for meals to include 15-35% energy as protein, 25-35% energy from fat, 35-60% energy from carbohydrates, less than 287m of dietary cholesterol, 20-35 gm of total fiber daily;Understanding of distribution of calorie intake throughout the day with the consumption of 4-5 meals/snacks    Improve shortness of breath with ADL's  Yes    Intervention  Provide education, individualized exercise plan and daily activity instruction to help decrease symptoms of SOB with activities of daily living.    Expected Outcomes  Short Term: Improve cardiorespiratory fitness to achieve a reduction of symptoms when performing ADLs;Long Term: Be able to perform more ADLs without symptoms or delay the onset of symptoms       Core Components/Risk Factors/Patient Goals Review:    Core Components/Risk Factors/Patient Goals at Discharge (Final Review):    ITP Comments: ITP Comments    Row Name 04/09/18 1431           ITP Comments  Dr. WJennet Maduro Medical Director          Comments:

## 2018-04-15 ENCOUNTER — Encounter (HOSPITAL_COMMUNITY): Payer: Self-pay | Admitting: *Deleted

## 2018-04-22 ENCOUNTER — Encounter (HOSPITAL_COMMUNITY)
Admission: RE | Admit: 2018-04-22 | Discharge: 2018-04-22 | Disposition: A | Discharge status: Home / Self Care | Payer: Medicare Other | Source: Ambulatory Visit | Attending: Pulmonary Disease | Admitting: Pulmonary Disease

## 2018-04-22 VITALS — Wt 159.6 lb

## 2018-04-22 DIAGNOSIS — J9611 Chronic respiratory failure with hypoxia: Secondary | ICD-10-CM

## 2018-04-22 NOTE — Progress Notes (Signed)
Daily Session Note  Patient Details  Name: [name] MRN: [id number] Date of Birth: 12/03/1934 Referring Provider:     Pulmonary Rehab Walk Test from 04/10/2018 in [place]  Referring Provider  Dr. Elsworth Soho      Encounter Date: 04/22/2018  Check In: Session Check In - 04/22/18 1401      Check-In   Supervising physician immediately available to respond to emergencies  [facility] immediately available    Physician(s)  Dr.Powell     Location  [facility]    Staff Present  Su Hilt, MS, ACSM RCEP, Exercise Physiologist;Heba Ige Ysidro Evert, RN;Olinty Celesta Aver, MS, ACSM CEP, Exercise Physiologist;Annedrea Rosezella [place], RN, [place]    Medication changes reported      No    Fall or balance concerns reported     No    Tobacco Cessation  No Change    Warm-up and Cool-down  Performed as group-led instruction    Resistance Training Performed  Yes    VAD Patient?  No    PAD/SET Patient?  No      Pain Assessment   Currently in Pain?  No/denies       Capillary Blood Glucose: No results found for this or any previous visit (from the past 24 hour(s)).  Exercise Prescription Changes - 04/22/18 1600      Response to Exercise   Blood Pressure (Admit)  102/70    Blood Pressure (Exercise)  132/76    Blood Pressure (Exit)  102/70    Heart Rate (Admit)  67 bpm    Heart Rate (Exercise)  88 bpm    Heart Rate (Exit)  67 bpm    Oxygen Saturation (Admit)  95 %    Oxygen Saturation (Exercise)  92 %    Oxygen Saturation (Exit)  96 %    Rating of Perceived Exertion (Exercise)  15    Perceived Dyspnea (Exercise)  2    Duration  Progress to 45 minutes of aerobic exercise without signs/symptoms of physical distress    Intensity  Other (comment)   40-80% of HRR     Progression   Progression  Continue to progress workloads to maintain intensity without signs/symptoms of physical distress.      Resistance Training   Training Prescription  Yes     Weight  orange bands    Reps  10-15    Time  10 Minutes      Oxygen   Oxygen  Continuous    Liters  2      NuStep   Level  1    SPM  80    Minutes  34      Track   Laps  5    Minutes  17       Social History   Tobacco Use  Smoking Status Former Smoker  . Packs/day: 1.00  . Years: 40.00  . Pack years: 40.00  . Types: Cigarettes  . Last attempt to quit: 03/20/2003  . Years since quitting: 15.1  Smokeless Tobacco Never Used    Goals Met:  Exercise tolerated well No report of cardiac concerns or symptoms Strength training completed today  Goals Unmet:  Not Applicable  Comments: Service time is from 1330 to 1510    Dr. Rush Farmer is Medical Director for Pulmonary Rehab at [facility].

## 2018-04-23 NOTE — Progress Notes (Signed)
Pulmonary Individual Treatment Plan  Patient Details  Name: [name] MRN: [id number] Date of Birth: 02/19/1935 Referring Provider:     Pulmonary Rehab Walk Test from 04/10/2018 in [place]  Referring Provider  Dr. Elsworth Soho      Initial Encounter Date:    Pulmonary Rehab Walk Test from 04/10/2018 in [place]  Date  04/11/18      Visit Diagnosis: Chronic respiratory failure with hypoxia ([place])  Patient's Home Medications on Admission:   Current Outpatient Medications:  .  albuterol (PROVENTIL HFA;VENTOLIN HFA) 108 (90 Base) MCG/ACT inhaler, Inhale 2 puffs into the lungs every 6 (six) hours as needed for wheezing or shortness of breath., Disp: 1 Inhaler, Rfl: 2 .  arformoterol (BROVANA) 15 MCG/2ML NEBU, Take 2 mLs (15 mcg total) by nebulization 2 (two) times daily., Disp: 120 mL, Rfl: 5 .  atorvastatin (LIPITOR) 10 MG tablet, TAKE ONE TABLET EACH DAY AT 6PM, Disp: 60 tablet, Rfl: 0 .  budesonide (PULMICORT) 0.5 MG/2ML nebulizer solution, Take 2 mLs (0.5 mg total) by nebulization 2 (two) times daily., Disp: 120 mL, Rfl: 5 .  DIGOX 125 MCG tablet, TAKE ONE TABLET DAILY, Disp: 90 tablet, Rfl: 0 .  diltiazem (CARDIZEM CD) 240 MG 24 hr capsule, TAKE ONE CAPSULE EACH DAY, Disp: 90 capsule, Rfl: 0 .  latanoprost (XALATAN) 0.005 % ophthalmic solution, Place 1 drop into both eyes at bedtime. Reported on 11/09/2015, Disp: , Rfl:  .  pantoprazole (PROTONIX) 40 MG tablet, TAKE ONE TABLET BY MOUTH ONCE DAILY, Disp: 90 tablet, Rfl: 1 .  SIMBRINZA 1-0.2 % SUSP, Place 1 drop into both eyes 2 (two) times daily. , Disp: , Rfl: 57  Past Medical History: Past Medical History:  Diagnosis Date  . Arthritis   . COPD (chronic obstructive pulmonary disease) ([place])   . Dyspnea on exertion   . GERD (gastroesophageal reflux disease)   . Glaucoma of both eyes   . History of atrial fibrillation without current medication 2010   POST SURG 2010--   PER PT NO ISSUES SINCE  . History of breast cancer    DX DCIS IN 2004--  S/P RIGHT MASTECTOMY , NO CHEMORADIATION---  NO RECURRENCE  . History of CHF (congestive heart failure)    POST SURG 2010  . History of hypertension   . History of shingles    02/ 2015--  back of neck and left flank--  no residual pain  . ICH (intracerebral hemorrhage) ([place]) 02/05/2015  . Nephrolithiasis   . Recurrent bladder papillary carcinoma ([place]) first dx 06/ 2014   s/p  turbt's  and instillation mitomycin c (chemo)//dx again in 2015-different type    Tobacco Use: Social History   Tobacco Use  Smoking Status Former Smoker  . Packs/day: 1.00  . Years: 40.00  . Pack years: 40.00  . Types: Cigarettes  . Last attempt to quit: 03/20/2003  . Years since quitting: 15.1  Smokeless Tobacco Never Used    Labs: Recent Chemical engineer    Labs for ITP Cardiac and Pulmonary Rehab Latest Ref Rng & Units 07/11/2015 11/09/2015 05/09/2016 04/19/2017 12/27/2017   Cholestrol 100 - 199 mg/dL 125 - 131 121 -   LDLCALC 0 - 99 mg/dL 59 - 48 52 -   HDL >39 mg/dL 48 - 46.80 46 -   Trlycerides 0 - 149 mg/dL 92 - 184.0(H) 114 -   Hemoglobin A1c 4.8 - 5.6 % - 6.1 6.0 - 5.9(H)  TCO2 0 - 100 mmol/L - - - - -      Capillary Blood Glucose: Lab Results  Component Value Date   GLUCAP 85 12/28/2017   GLUCAP 83 12/28/2017   GLUCAP 129 (H) 12/27/2017   GLUCAP 169 (H) 12/27/2017   GLUCAP 145 (H) 02/05/2015     Pulmonary Assessment Scores: Pulmonary Assessment Scores    Row Name 04/11/18 1003 04/15/18 1650       ADL UCSD   ADL Phase  Entry  Entry    SOB Score total  -  53      CAT Score   CAT Score  -  12      mMRC Score   mMRC Score  2  -       Pulmonary Function Assessment:   Exercise Target Goals: Exercise Program Goal: Individual exercise prescription set using results from initial 6 min walk test and THRR while considering  patient's activity barriers and safety.   Exercise Prescription Goal: Initial  exercise prescription builds to 30-45 minutes a day of aerobic activity, 2-3 days per week.  Home exercise guidelines will be given to patient during program as part of exercise prescription that the participant will acknowledge.  Activity Barriers & Risk Stratification: Activity Barriers & Cardiac Risk Stratification - 04/09/18 1615      Activity Barriers & Cardiac Risk Stratification   Cardiac Risk Stratification  Low       6 Minute Walk: 6 Minute Walk    Row Name 04/11/18 0958         6 Minute Walk   Phase  Initial     Distance  700 feet     Walk Time  6 minutes     # of Rest Breaks  - 1 standing rest break for 30 seconds     MPH  1.33     METS  2     RPE  12     Perceived Dyspnea   2     Comments  used wheelchair/ used forehead probe/blurry vision during 65mt which resided/lost balance around corners due to stroke     Resting HR  68 bpm     Resting BP  104/70     Resting Oxygen Saturation   92 %     Exercise Oxygen Saturation  during 6 min walk  88 %     Max Ex. HR  89 bpm     Max Ex. BP  130/64       Interval HR   1 Minute HR  70     2 Minute HR  78     3 Minute HR  73     4 Minute HR  89     5 Minute HR  82     6 Minute HR  67     Interval Heart Rate?  Yes       Interval Oxygen   Interval Oxygen?  Yes     Baseline Oxygen Saturation %  92 %     1 Minute Oxygen Saturation %  88 %     1 Minute Liters of Oxygen  2 L     2 Minute Oxygen Saturation %  94 %     2 Minute Liters of Oxygen  3 L     3 Minute Oxygen Saturation %  92 %     3 Minute Liters of Oxygen  3 L     4 Minute Oxygen Saturation %  93 %     4 Minute Liters of Oxygen  3 L     5 Minute Oxygen Saturation %  96 %     5 Minute Liters of Oxygen  3 L     6 Minute Oxygen Saturation %  88 %     6 Minute Liters of Oxygen  3 L        Oxygen Initial Assessment: Oxygen Initial Assessment - 04/11/18 0957      Initial 6 min Walk   Oxygen Used  Continuous;E-Tanks    Liters per minute  3      Program  Oxygen Prescription   Program Oxygen Prescription  Continuous;E-Tanks    Liters per minute  3    Comments  Needs to use forehead probe since fingers are cold and nails are always painted       Oxygen Re-Evaluation: Oxygen Re-Evaluation    Row Name 04/22/18 0736             Program Oxygen Prescription   Program Oxygen Prescription  Continuous;E-Tanks       Liters per minute  3       Comments  Needs to use forehead probe since fingers are cold and nails are always painted         Home Oxygen   Home Oxygen Device  Home Concentrator;Portable Concentrator       Sleep Oxygen Prescription  Continuous       Liters per minute  2       Home Exercise Oxygen Prescription  Continuous       Liters per minute  2       Home at Rest Exercise Oxygen Prescription  Continuous       Liters per minute  2       Compliance with Home Oxygen Use  No         Goals/Expected Outcomes   Short Term Goals  To learn and exhibit compliance with exercise, home and travel O2 prescription;To learn and understand importance of monitoring SPO2 with pulse oximeter and demonstrate accurate use of the pulse oximeter.;To learn and understand importance of maintaining oxygen saturations>88%;To learn and demonstrate proper pursed lip breathing techniques or other breathing techniques.;To learn and demonstrate proper use of respiratory medications       Long  Term Goals  Exhibits compliance with exercise, home and travel O2 prescription;Verbalizes importance of monitoring SPO2 with pulse oximeter and return demonstration;Maintenance of O2 saturations>88%;Exhibits proper breathing techniques, such as pursed lip breathing or other method taught during program session;Compliance with respiratory medication;Demonstrates proper use of MDI's       Goals/Expected Outcomes  compliance and understanding          Oxygen Discharge (Final Oxygen Re-Evaluation): Oxygen Re-Evaluation - 04/22/18 0736      Program Oxygen Prescription    Program Oxygen Prescription  Continuous;E-Tanks    Liters per minute  3    Comments  Needs to use forehead probe since fingers are cold and nails are always painted      Home Oxygen   Home Oxygen Device  Home Concentrator;Portable Concentrator    Sleep Oxygen Prescription  Continuous    Liters per minute  2    Home Exercise Oxygen Prescription  Continuous    Liters per minute  2    Home at Rest Exercise Oxygen Prescription  Continuous    Liters per minute  2    Compliance with Home Oxygen Use  No  Goals/Expected Outcomes   Short Term Goals  To learn and exhibit compliance with exercise, home and travel O2 prescription;To learn and understand importance of monitoring SPO2 with pulse oximeter and demonstrate accurate use of the pulse oximeter.;To learn and understand importance of maintaining oxygen saturations>88%;To learn and demonstrate proper pursed lip breathing techniques or other breathing techniques.;To learn and demonstrate proper use of respiratory medications    Long  Term Goals  Exhibits compliance with exercise, home and travel O2 prescription;Verbalizes importance of monitoring SPO2 with pulse oximeter and return demonstration;Maintenance of O2 saturations>88%;Exhibits proper breathing techniques, such as pursed lip breathing or other method taught during program session;Compliance with respiratory medication;Demonstrates proper use of MDI's    Goals/Expected Outcomes  compliance and understanding       Initial Exercise Prescription: Initial Exercise Prescription - 04/11/18 1000      Date of Initial Exercise RX and Referring Provider   Date  04/11/18    Referring Provider  Dr. Elsworth Soho      Oxygen   Oxygen  Continuous    Liters  3      Recumbant Bike   Level  1    RPM  10    Minutes  17      NuStep   Level  1    SPM  80    Minutes  17    METs  1.3      Prescription Details   Frequency (times per week)  2    Duration  Progress to 45 minutes of aerobic  exercise without signs/symptoms of physical distress      Intensity   THRR 40-80% of Max Heartrate  56-112    Ratings of Perceived Exertion  11-13    Perceived Dyspnea  0-4      Progression   Progression  Continue progressive overload as per policy without signs/symptoms or physical distress.      Resistance Training   Training Prescription  Yes    Weight  orange bands    Reps  10-15       Perform Capillary Blood Glucose checks as needed.  Exercise Prescription Changes:  Exercise Prescription Changes    Row Name 04/22/18 1600             Response to Exercise   Blood Pressure (Admit)  102/70       Blood Pressure (Exercise)  132/76       Blood Pressure (Exit)  102/70       Heart Rate (Admit)  67 bpm       Heart Rate (Exercise)  88 bpm       Heart Rate (Exit)  67 bpm       Oxygen Saturation (Admit)  95 %       Oxygen Saturation (Exercise)  92 %       Oxygen Saturation (Exit)  96 %       Rating of Perceived Exertion (Exercise)  15       Perceived Dyspnea (Exercise)  2       Duration  Progress to 45 minutes of aerobic exercise without signs/symptoms of physical distress       Intensity  Other (comment) 40-80% of HRR         Progression   Progression  Continue to progress workloads to maintain intensity without signs/symptoms of physical distress.         Resistance Training   Training Prescription  Yes       Weight  orange  bands       Reps  10-15       Time  10 Minutes         Oxygen   Oxygen  Continuous       Liters  2         NuStep   Level  1       SPM  80       Minutes  34         Track   Laps  5       Minutes  17          Exercise Comments:   Exercise Goals and Review:  Exercise Goals    Row Name 04/09/18 1615             Exercise Goals   Increase Physical Activity  Yes       Intervention  Provide advice, education, support and counseling about physical activity/exercise needs.;Develop an individualized exercise prescription for aerobic  and resistive training based on initial evaluation findings, risk stratification, comorbidities and participant's personal goals.       Expected Outcomes  Short Term: Attend rehab on a regular basis to increase amount of physical activity.;Long Term: Add in home exercise to make exercise part of routine and to increase amount of physical activity.;Long Term: Exercising regularly at least 3-5 days a week.       Increase Strength and Stamina  Yes       Intervention  Provide advice, education, support and counseling about physical activity/exercise needs.;Develop an individualized exercise prescription for aerobic and resistive training based on initial evaluation findings, risk stratification, comorbidities and participant's personal goals.       Expected Outcomes  Short Term: Increase workloads from initial exercise prescription for resistance, speed, and METs.;Short Term: Perform resistance training exercises routinely during rehab and add in resistance training at home;Long Term: Improve cardiorespiratory fitness, muscular endurance and strength as measured by increased METs and functional capacity (6MWT)       Able to understand and use rate of perceived exertion (RPE) scale  Yes       Intervention  Provide education and explanation on how to use RPE scale       Expected Outcomes  Short Term: Able to use RPE daily in rehab to express subjective intensity level;Long Term:  Able to use RPE to guide intensity level when exercising independently       Able to understand and use Dyspnea scale  Yes       Intervention  Provide education and explanation on how to use Dyspnea scale       Expected Outcomes  Short Term: Able to use Dyspnea scale daily in rehab to express subjective sense of shortness of breath during exertion;Long Term: Able to use Dyspnea scale to guide intensity level when exercising independently       Knowledge and understanding of Target Heart Rate Range (THRR)  Yes       Intervention   Provide education and explanation of THRR including how the numbers were predicted and where they are located for reference       Expected Outcomes  Short Term: Able to state/look up THRR;Short Term: Able to use daily as guideline for intensity in rehab;Long Term: Able to use THRR to govern intensity when exercising independently       Understanding of Exercise Prescription  Yes       Intervention  Provide education, explanation, and written materials on patient's  individual exercise prescription       Expected Outcomes  Short Term: Able to explain program exercise prescription;Long Term: Able to explain home exercise prescription to exercise independently          Exercise Goals Re-Evaluation : Exercise Goals Re-Evaluation    Row Name 04/22/18 0736             Exercise Goal Re-Evaluation   Exercise Goals Review  Increase Physical Activity;Increase Strength and Stamina;Able to understand and use rate of perceived exertion (RPE) scale;Able to understand and use Dyspnea scale;Knowledge and understanding of Target Heart Rate Range (THRR);Understanding of Exercise Prescription       Comments  Patient's first day of exercise should be today.        Expected Outcomes  Through rehab and exercising at home, patient will find it easier to carry out ADL's and will gain the confidence to establish an exercise routine at home.           Discharge Exercise Prescription (Final Exercise Prescription Changes): Exercise Prescription Changes - 04/22/18 1600      Response to Exercise   Blood Pressure (Admit)  102/70    Blood Pressure (Exercise)  132/76    Blood Pressure (Exit)  102/70    Heart Rate (Admit)  67 bpm    Heart Rate (Exercise)  88 bpm    Heart Rate (Exit)  67 bpm    Oxygen Saturation (Admit)  95 %    Oxygen Saturation (Exercise)  92 %    Oxygen Saturation (Exit)  96 %    Rating of Perceived Exertion (Exercise)  15    Perceived Dyspnea (Exercise)  2    Duration  Progress to 45 minutes  of aerobic exercise without signs/symptoms of physical distress    Intensity  Other (comment)   40-80% of HRR     Progression   Progression  Continue to progress workloads to maintain intensity without signs/symptoms of physical distress.      Resistance Training   Training Prescription  Yes    Weight  orange bands    Reps  10-15    Time  10 Minutes      Oxygen   Oxygen  Continuous    Liters  2      NuStep   Level  1    SPM  80    Minutes  34      Track   Laps  5    Minutes  17       Nutrition:  Target Goals: Understanding of nutrition guidelines, daily intake of sodium <1516m, cholesterol <2079m calories 30% from fat and 7% or less from saturated fats, daily to have 5 or more servings of fruits and vegetables.  Biometrics:    Nutrition Therapy Plan and Nutrition Goals: Nutrition Therapy & Goals - 04/10/18 1403      Nutrition Therapy   Diet  consistent carbohydrate heart healthy      Personal Nutrition Goals   Nutrition Goal  Pt to identify and limit food sources of sodium, saturated fat, refined carbohydrates    Personal Goal #2  Identify food quantities necessary to achieve wt loss of  -1# per week to a goal wt loss of 5-7 lb at graduation from pulmonary rehab.    Personal Goal #3  Pt able to name foods that affect blood glucose       Intervention Plan   Intervention  Prescribe, educate and counsel regarding individualized specific dietary modifications aiming  towards targeted core components such as weight, hypertension, lipid management, diabetes, heart failure and other comorbidities.    Expected Outcomes  Short Term Goal: Understand basic principles of dietary content, such as calories, fat, sodium, cholesterol and nutrients.       Nutrition Assessments: Nutrition Assessments - 04/10/18 1404      Rate Your Plate Scores   Pre Score  43       Nutrition Goals Re-Evaluation:   Nutrition Goals Discharge (Final Nutrition Goals  Re-Evaluation):   Psychosocial: Target Goals: Acknowledge presence or absence of significant depression and/or stress, maximize coping skills, provide positive support system. Participant is able to verbalize types and ability to use techniques and skills needed for reducing stress and depression.  Initial Review & Psychosocial Screening: Initial Psych Review & Screening - 04/09/18 1611      Family Dynamics   Comments  pt with no identifiable barriers.  Pt denes any stress or anxiety       Quality of Life Scores:  Scores of 19 and below usually indicate a poorer quality of life in these areas.  A difference of  2-3 points is a clinically meaningful difference.  A difference of 2-3 points in the total score of the Quality of Life Index has been associated with significant improvement in overall quality of life, self-image, physical symptoms, and general health in studies assessing change in quality of life.  PHQ-9: Recent Review Flowsheet Data    Depression screen [facility] 2/9 04/09/2018 12/27/2017 11/12/2015 05/20/2015 04/22/2015   Decreased Interest 0 0 0 0 0   Down, Depressed, Hopeless 0 0 0 0 0   PHQ - 2 Score 0 0 0 0 0   Altered sleeping 0 - - - 1   Tired, decreased energy 1 - - - 0   Change in appetite 0 - - - 0   Feeling bad or failure about yourself  0 - - - 0   Trouble concentrating 0 - - - 0   Moving slowly or fidgety/restless 0 - - - 0   Suicidal thoughts 0 - - - 0   PHQ-9 Score 1 - - - 1   Difficult doing work/chores Not difficult at all - - - -     Interpretation of Total Score  Total Score Depression Severity:  1-4 = Minimal depression, 5-9 = Mild depression, 10-14 = Moderate depression, 15-19 = Moderately severe depression, 20-27 = Severe depression   Psychosocial Evaluation and Intervention: Psychosocial Evaluation - 04/09/18 1614      Psychosocial Evaluation & Interventions   Interventions  Stress management education;Relaxation education;Encouraged to exercise with  the program and follow exercise prescription    Comments  Pt has positive and healthy coping skills with supportive family.    Expected Outcomes  Pt will continue to demonstrate positive coping skills and healthy outlook.    Continue Psychosocial Services   Follow up required by staff       Psychosocial Re-Evaluation:   Psychosocial Discharge (Final Psychosocial Re-Evaluation):   Education: Education Goals: Education classes will be provided on a weekly basis, covering required topics. Participant will state understanding/return demonstration of topics presented.  Learning Barriers/Preferences: Learning Barriers/Preferences - 04/09/18 1440      Learning Barriers/Preferences   Learning Barriers  Sight    Learning Preferences  Individual Instruction;Group Instruction;Verbal Instruction;Written Material       Education Topics: Risk Factor Reduction:  -Group instruction that is supported by a PowerPoint presentation. Instructor discusses the definition of  a risk factor, different risk factors for pulmonary disease, and how the heart and lungs work together.     Nutrition for Pulmonary Patient:  -Group instruction provided by PowerPoint slides, verbal discussion, and written materials to support subject matter. The instructor gives an explanation and review of healthy diet recommendations, which includes a discussion on weight management, recommendations for fruit and vegetable consumption, as well as protein, fluid, caffeine, fiber, sodium, sugar, and alcohol. Tips for eating when patients are short of breath are discussed.   Pursed Lip Breathing:  -Group instruction that is supported by demonstration and informational handouts. Instructor discusses the benefits of pursed lip and diaphragmatic breathing and detailed demonstration on how to preform both.     Oxygen Safety:  -Group instruction provided by PowerPoint, verbal discussion, and written material to support subject matter.  There is an overview of "What is Oxygen" and "Why do we need it".  Instructor also reviews how to create a safe environment for oxygen use, the importance of using oxygen as prescribed, and the risks of noncompliance. There is a brief discussion on traveling with oxygen and resources the patient may utilize.   Oxygen Equipment:  -Group instruction provided by [facility] Staff utilizing handouts, written materials, and equipment demonstrations.   Signs and Symptoms:  -Group instruction provided by written material and verbal discussion to support subject matter. Warning signs and symptoms of infection, stroke, and heart attack are reviewed and when to call the physician/911 reinforced. Tips for preventing the spread of infection discussed.   Advanced Directives:  -Group instruction provided by verbal instruction and written material to support subject matter. Instructor reviews Advanced Directive laws and proper instruction for filling out document.   Pulmonary Video:  -Group video education that reviews the importance of medication and oxygen compliance, exercise, good nutrition, pulmonary hygiene, and pursed lip and diaphragmatic breathing for the pulmonary patient.   Exercise for the Pulmonary Patient:  -Group instruction that is supported by a PowerPoint presentation. Instructor discusses benefits of exercise, core components of exercise, frequency, duration, and intensity of an exercise routine, importance of utilizing pulse oximetry during exercise, safety while exercising, and options of places to exercise outside of rehab.     Pulmonary Medications:  -Verbally interactive group education provided by instructor with focus on inhaled medications and proper administration.   Anatomy and Physiology of the Respiratory System and Intimacy:  -Group instruction provided by PowerPoint, verbal discussion, and written material to support subject matter. Instructor reviews respiratory cycle  and anatomical components of the respiratory system and their functions. Instructor also reviews differences in obstructive and restrictive respiratory diseases with examples of each. Intimacy, Sex, and Sexuality differences are reviewed with a discussion on how relationships can change when diagnosed with pulmonary disease. Common sexual concerns are reviewed.   MD DAY -A group question and answer session with a medical doctor that allows participants to ask questions that relate to their pulmonary disease state.   OTHER EDUCATION -Group or individual verbal, written, or video instructions that support the educational goals of the pulmonary rehab program.   Holiday Eating Survival Tips:  -Group instruction provided by PowerPoint slides, verbal discussion, and written materials to support subject matter. The instructor gives patients tips, tricks, and techniques to help them not only survive but enjoy the holidays despite the onslaught of food that accompanies the holidays.   Knowledge Questionnaire Score: Knowledge Questionnaire Score - 04/15/18 1652      Knowledge Questionnaire Score   Pre Score  12/18       Core Components/Risk Factors/Patient Goals at Admission: Personal Goals and Risk Factors at Admission - 04/09/18 1436      Core Components/Risk Factors/Patient Goals on Admission    Weight Management  Yes;Weight Loss    Intervention  Weight Management: Develop a combined nutrition and exercise program designed to reach desired caloric intake, while maintaining appropriate intake of nutrient and fiber, sodium and fats, and appropriate energy expenditure required for the weight goal.;Weight Management: Provide education and appropriate resources to help participant work on and attain dietary goals.;Weight Management/Obesity: Establish reasonable short term and long term weight goals.    Admit Weight  157 lb 13.6 oz (71.6 kg)    Goal Weight: Short Term  153 lb (69.4 kg)    Goal  Weight: Long Term  150 lb (68 kg)    Expected Outcomes  Short Term: Continue to assess and modify interventions until short term weight is achieved;Long Term: Adherence to nutrition and physical activity/exercise program aimed toward attainment of established weight goal;Weight Loss: Understanding of general recommendations for a balanced deficit meal plan, which promotes 1-2 lb weight loss per week and includes a negative energy balance of [phone] kcal/d;Understanding recommendations for meals to include 15-35% energy as protein, 25-35% energy from fat, 35-60% energy from carbohydrates, less than 266m of dietary cholesterol, 20-35 gm of total fiber daily;Understanding of distribution of calorie intake throughout the day with the consumption of 4-5 meals/snacks    Improve shortness of breath with ADL's  Yes    Intervention  Provide education, individualized exercise plan and daily activity instruction to help decrease symptoms of SOB with activities of daily living.    Expected Outcomes  Short Term: Improve cardiorespiratory fitness to achieve a reduction of symptoms when performing ADLs;Long Term: Be able to perform more ADLs without symptoms or delay the onset of symptoms       Core Components/Risk Factors/Patient Goals Review:  Goals and Risk Factor Review    Row Name 04/23/18 1202             Core Components/Risk Factors/Patient Goals Review   Personal Goals Review  Weight Management/Obesity;Improve shortness of breath with ADL's;Increase knowledge of respiratory medications and ability to use respiratory devices properly.;Develop more efficient breathing techniques such as purse lipped breathing and diaphragmatic breathing and practicing self-pacing with activity.       Review  Pt has completed 1 exercise session on 8/20.  Unable to adequately assess at this time pt progress toward her patient goals       Expected Outcomes  See Admission Outcomes/Goals          Core Components/Risk  Factors/Patient Goals at Discharge (Final Review):  Goals and Risk Factor Review - 04/23/18 1202      Core Components/Risk Factors/Patient Goals Review   Personal Goals Review  Weight Management/Obesity;Improve shortness of breath with ADL's;Increase knowledge of respiratory medications and ability to use respiratory devices properly.;Develop more efficient breathing techniques such as purse lipped breathing and diaphragmatic breathing and practicing self-pacing with activity.    Review  Pt has completed 1 exercise session on 8/20.  Unable to adequately assess at this time pt progress toward her patient goals    Expected Outcomes  See Admission Outcomes/Goals       ITP Comments: ITP Comments    Row Name 04/09/18 1431 04/23/18 1201         ITP Comments  Dr. WJennet Maduro Medical Director  Dr. WJennet Maduro Medical Director  Comments:  Pt has completed 1 exercise session. Maurice Small RN, BSN Cardiac and Pulmonary Rehab Nurse Navigator

## 2018-04-24 ENCOUNTER — Ambulatory Visit (INDEPENDENT_AMBULATORY_CARE_PROVIDER_SITE_OTHER): Payer: Medicare Other | Admitting: Adult Health

## 2018-04-24 ENCOUNTER — Encounter: Payer: Self-pay | Admitting: Adult Health

## 2018-04-24 DIAGNOSIS — J449 Chronic obstructive pulmonary disease, unspecified: Secondary | ICD-10-CM | POA: Diagnosis not present

## 2018-04-24 DIAGNOSIS — J9611 Chronic respiratory failure with hypoxia: Secondary | ICD-10-CM | POA: Diagnosis not present

## 2018-04-24 NOTE — Assessment & Plan Note (Signed)
Compensated on present regimen   Plan  Patient Instructions  Continue on Pulmicort and Brovana nebulizer twice daily Continue with activity as tolerated. Continue with pulmonary rehab Continue on Oxygen 2l/m with activity and At bedtime  .  Follow-up with Dr. Elsworth Soho in 3 to 4 months and as needed

## 2018-04-24 NOTE — Patient Instructions (Addendum)
Continue on Pulmicort and Brovana nebulizer twice daily Continue with activity as tolerated. Continue with pulmonary rehab Continue on Oxygen 2l/m with activity and At bedtime  .  Follow-up with Dr. Elsworth Soho in 3 to 4 months and as needed

## 2018-04-24 NOTE — Assessment & Plan Note (Signed)
Cont on Oxygen

## 2018-04-24 NOTE — Progress Notes (Signed)
_0  ID: [name], female    DOB: Oct 14, 1934, 83 y.o.   MRN: [id number]  Chief Complaint  Patient presents with  . Follow-up    COPD     Referring provider: Janith Lima, MD  HPI: 83 year old female former smoker seen for pulmonary consult June 2019 for COPD and chronic respiratory failure on oxygen Past medical history significant for congestive heart failure, A. fib , glaucoma, breast cancer status post resection, recurrent bladder cancer status post TURP/chemo Previous history of intracranial bleed status post craniotomy and 2016   TEST /Events  Admitted April 2019 for COPD exacerbation Spirometry June 2019 showed severe COPD with FEV1 at 137%, ratio 48, FVC 57%.  O2 saturations at rest on room air 87%. Chest x-ray April 2019 consistent with COPD and chronic bronchitic changes. Pneumovax 2018, Prevnar 13 2015  04/24/2018 Follow up : COPD , o2 RF  Patient returns for a 59-monthfollow-up for severe COPD.  She was seen for pulmonary consult last visit to establish for COPD.  She is on Pulmicort and Brovana nebulizer twice daily.  She was referred to pulmonary rehab which she has started.  Patient says overall breathing is doing okay.  She gets short of breath with heavy activity.  She denies any flare cough or wheezing. She likes pulmonary rehab. Feels she is getting stronger.  On oxygen 2l/m with activity  And At bedtime  .  Getting ready to move into smaller townhome .  Uses a cane to get around due to previous stroke .  She is independent , can drive. Is able to do light activity.     Allergies  Allergen Reactions  . Heparin     Previous history of hemorrhagic stroke.  .Marland KitchenMorphine And Related Nausea And Vomiting    Immunization History  Administered Date(s) Administered  . Influenza, High Dose Seasonal PF 06/08/2016, 05/13/2017  . Influenza-Unspecified 07/05/2015  . Pneumococcal Conjugate-13 05/20/2014  . Pneumococcal Polysaccharide-23 10/09/2011,  06/17/2017  . Tdap 09/03/2005, 11/09/2015  . Zoster 09/04/2007    Past Medical History:  Diagnosis Date  . Arthritis   . COPD (chronic obstructive pulmonary disease) ([place]   . Dyspnea on exertion   . GERD (gastroesophageal reflux disease)   . Glaucoma of both eyes   . History of atrial fibrillation without current medication 2010   POST SURG 2010--  PER PT NO ISSUES SINCE  . History of breast cancer    DX DCIS IN 2004--  S/P RIGHT MASTECTOMY , NO CHEMORADIATION---  NO RECURRENCE  . History of CHF (congestive heart failure)    POST SURG 2010  . History of hypertension   . History of shingles    02/ 2015--  back of neck and left flank--  no residual pain  . ICH (intracerebral hemorrhage) ([place] 02/05/2015  . Nephrolithiasis   . Recurrent bladder papillary carcinoma ([place] first dx 06/ 2014   s/p  turbt's  and instillation mitomycin c (chemo)//dx again in 2015-different type    Tobacco History: Social History   Tobacco Use  Smoking Status Former Smoker  . Packs/day: 1.00  . Years: 40.00  . Pack years: 40.00  . Types: Cigarettes  . Last attempt to quit: 03/20/2003  . Years since quitting: 15.1  Smokeless Tobacco Never Used   Counseling given: Not Answered   Outpatient Medications Prior to Visit  Medication Sig Dispense Refill  . albuterol (PROVENTIL HFA;VENTOLIN HFA) 108 (90 Base) MCG/ACT inhaler Inhale 2 puffs into the lungs every  6 (six) hours as needed for wheezing or shortness of breath. 1 Inhaler 2  . arformoterol (BROVANA) 15 MCG/2ML NEBU Take 2 mLs (15 mcg total) by nebulization 2 (two) times daily. 120 mL 5  . atorvastatin (LIPITOR) 10 MG tablet TAKE ONE TABLET EACH DAY AT 6PM 60 tablet 0  . budesonide (PULMICORT) 0.5 MG/2ML nebulizer solution Take 2 mLs (0.5 mg total) by nebulization 2 (two) times daily. 120 mL 5  . DIGOX 125 MCG tablet TAKE ONE TABLET DAILY 90 tablet 0  . diltiazem (CARDIZEM CD) 240 MG 24 hr capsule TAKE ONE CAPSULE EACH DAY 90 capsule 0  .  latanoprost (XALATAN) 0.005 % ophthalmic solution Place 1 drop into both eyes at bedtime. Reported on 11/09/2015    . pantoprazole (PROTONIX) 40 MG tablet TAKE ONE TABLET BY MOUTH ONCE DAILY 90 tablet 1  . SIMBRINZA 1-0.2 % SUSP Place 1 drop into both eyes 2 (two) times daily.   99   No facility-administered medications prior to visit.      Review of Systems  Constitutional:   No  weight loss, night sweats,  Fevers, chills,  +fatigue, or  lassitude.  HEENT:   No headaches,  Difficulty swallowing,  Tooth/dental problems, or  Sore throat,                No sneezing, itching, ear ache, nasal congestion, post nasal drip,   CV:  No chest pain,  Orthopnea, PND, swelling in lower extremities, anasarca, dizziness, palpitations, syncope.   GI  No heartburn, indigestion, abdominal pain, nausea, vomiting, diarrhea, change in bowel habits, loss of appetite, bloody stools.   Resp:   No chest wall deformity  Skin: no rash or lesions.  GU: no dysuria, change in color of urine, no urgency or frequency.  No flank pain, no hematuria   MS:  No joint pain or swelling.  No decreased range of motion.  No back pain.    Physical Exam  BP 102/68 (BP Location: Left Arm, Cuff Size: Normal)   Pulse 78   Ht 5' 6.5" (1.689 m)   Wt 158 lb (71.7 kg)   LMP 12/03/2002   SpO2 91%   BMI 25.12 kg/m   GEN: A/Ox3; pleasant , NAD, elderly , cane , O2    HEENT:  [place]/AT,  EACs-clear, TMs-wnl, NOSE-clear, THROAT-clear, no lesions, no postnasal drip or exudate noted.   NECK:  Supple w/ fair ROM; no JVD; normal carotid impulses w/o bruits; no thyromegaly or nodules palpated; no lymphadenopathy.    RESP  Decreased BS in bases ,  no accessory muscle use, no dullness to percussion  CARD:  RRR, no m/r/g, no peripheral edema, pulses intact, no cyanosis or clubbing.  GI:   Soft & nt; nml bowel sounds; no organomegaly or masses detected.   Musco: Warm bil, no deformities or joint swelling noted.   Neuro: alert, no  focal deficits noted.    Skin: Warm, no lesions or rashes    Lab Results:  CBC  BMET  Imaging: No results found.   Assessment & Plan:   COPD (chronic obstructive pulmonary disease) ([place]) Compensated on present regimen   Plan  Patient Instructions  Continue on Pulmicort and Brovana nebulizer twice daily Continue with activity as tolerated. Continue with pulmonary rehab Continue on Oxygen 2l/m with activity and At bedtime  .  Follow-up with Dr. Elsworth Soho in 3 to 4 months and as needed     Chronic respiratory failure with hypoxia ([place]) Cont on  Oxygen      Rexene Edison, NP 04/24/2018

## 2018-04-25 DIAGNOSIS — H2000 Unspecified acute and subacute iridocyclitis: Secondary | ICD-10-CM | POA: Diagnosis not present

## 2018-04-29 ENCOUNTER — Encounter (HOSPITAL_COMMUNITY)
Admission: RE | Admit: 2018-04-29 | Discharge: 2018-04-29 | Disposition: A | Discharge status: Home / Self Care | Payer: Medicare Other | Source: Ambulatory Visit | Attending: Pulmonary Disease | Admitting: Pulmonary Disease

## 2018-04-29 ENCOUNTER — Other Ambulatory Visit: Payer: Self-pay | Admitting: Cardiology

## 2018-04-29 DIAGNOSIS — J9611 Chronic respiratory failure with hypoxia: Secondary | ICD-10-CM | POA: Diagnosis not present

## 2018-04-29 NOTE — Progress Notes (Signed)
Daily Session Note  Patient Details  Name: [name] MRN: [id number] Date of Birth: May 09, 1935 Referring Provider:     Pulmonary Rehab Walk Test from 04/10/2018 in [place]  Referring Provider  Dr. Elsworth Soho      Encounter Date: 04/29/2018  Check In: Session Check In - 04/29/18 1537      Check-In   Supervising physician immediately available to respond to emergencies  [facility] immediately available    Physician(s)  Dr.Schertz    Location  [facility]    Staff Present  Maurice Small, RN, BSN;Molly DiVincenzo, MS, ACSM RCEP, Exercise Physiologist;Edia Pursifull Ysidro Evert, Felipe Drone, RN, MHA    Medication changes reported      No    Fall or balance concerns reported     No    Tobacco Cessation  No Change    Warm-up and Cool-down  Performed as group-led instruction    Resistance Training Performed  Yes    VAD Patient?  No    PAD/SET Patient?  No      Pain Assessment   Currently in Pain?  No/denies    Pain Score  0-No pain    Multiple Pain Sites  No       Capillary Blood Glucose: No results found for this or any previous visit (from the past 24 hour(s)).    Social History   Tobacco Use  Smoking Status Former Smoker  . Packs/day: 1.00  . Years: 40.00  . Pack years: 40.00  . Types: Cigarettes  . Last attempt to quit: 03/20/2003  . Years since quitting: 15.1  Smokeless Tobacco Never Used    Goals Met:  Exercise tolerated well No report of cardiac concerns or symptoms Strength training completed today  Goals Unmet:  Not Applicable  Comments: Service time is from 1330 to 1510    Dr. Rush Farmer is Medical Director for Pulmonary Rehab at [facility].

## 2018-04-29 NOTE — Telephone Encounter (Signed)
Rx sent to pharmacy

## 2018-05-01 ENCOUNTER — Encounter (HOSPITAL_COMMUNITY)
Admission: RE | Admit: 2018-05-01 | Discharge: 2018-05-01 | Disposition: A | Discharge status: Home / Self Care | Payer: Medicare Other | Source: Ambulatory Visit | Attending: Pulmonary Disease | Admitting: Pulmonary Disease

## 2018-05-01 DIAGNOSIS — J9611 Chronic respiratory failure with hypoxia: Secondary | ICD-10-CM

## 2018-05-01 NOTE — Progress Notes (Signed)
Daily Session Note  Patient Details  Name: [name] MRN: [id number] Date of Birth: 1935-02-01 Referring Provider:     Pulmonary Rehab Walk Test from 04/10/2018 in [place]  Referring Provider  Dr. Elsworth Soho      Encounter Date: 05/01/2018  Check In: Session Check In - 05/01/18 1349      Check-In   Supervising physician immediately available to respond to emergencies  [facility] immediately available    Physician(s)  Dr. Florene Glen    Location  [facility]    Staff Present  Maurice Small, RN, BSN;Shreshta Medley, MS, ACSM RCEP, Exercise Physiologist;Annedrea Stackhouse, RN, [place]    Medication changes reported      No    Fall or balance concerns reported     No    Tobacco Cessation  No Change    Warm-up and Cool-down  Performed as group-led instruction    Resistance Training Performed  Yes    VAD Patient?  No    PAD/SET Patient?  No      Pain Assessment   Currently in Pain?  No/denies    Multiple Pain Sites  No       Capillary Blood Glucose: No results found for this or any previous visit (from the past 24 hour(s)).    Social History   Tobacco Use  Smoking Status Former Smoker  . Packs/day: 1.00  . Years: 40.00  . Pack years: 40.00  . Types: Cigarettes  . Last attempt to quit: 03/20/2003  . Years since quitting: 15.1  Smokeless Tobacco Never Used    Goals Met:  Exercise tolerated well  Goals Unmet:  Not Applicable  Comments: Service time is from 1:30p to 3:30p    Dr. Rush Farmer is Medical Director for Pulmonary Rehab at [facility].

## 2018-05-06 ENCOUNTER — Encounter (HOSPITAL_COMMUNITY)
Admission: RE | Admit: 2018-05-06 | Discharge: 2018-05-06 | Disposition: A | Discharge status: Home / Self Care | Payer: Medicare Other | Source: Ambulatory Visit | Attending: Pulmonary Disease | Admitting: Pulmonary Disease

## 2018-05-06 VITALS — Wt 158.7 lb

## 2018-05-06 DIAGNOSIS — J9611 Chronic respiratory failure with hypoxia: Secondary | ICD-10-CM | POA: Diagnosis not present

## 2018-05-06 NOTE — Progress Notes (Signed)
[name] 83 y.o. female   DOB: 1935/08/25 MRN: [id number]          Nutrition No diagnosis found. Past Medical History:  Diagnosis Date  . Arthritis   . COPD (chronic obstructive pulmonary disease) ([place])   . Dyspnea on exertion   . GERD (gastroesophageal reflux disease)   . Glaucoma of both eyes   . History of atrial fibrillation without current medication 2010   POST SURG 2010--  PER PT NO ISSUES SINCE  . History of breast cancer    DX DCIS IN 2004--  S/P RIGHT MASTECTOMY , NO CHEMORADIATION---  NO RECURRENCE  . History of CHF (congestive heart failure)    POST SURG 2010  . History of hypertension   . History of shingles    02/ 2015--  back of neck and left flank--  no residual pain  . ICH (intracerebral hemorrhage) ([place]) 02/05/2015  . Nephrolithiasis   . Recurrent bladder papillary carcinoma ([place]) first dx 06/ 2014   s/p  turbt's  and instillation mitomycin c (chemo)//dx again in 2015-different type   Meds reviewed.    Ht: Ht Readings from Last 1 Encounters:  04/24/18 5' 6.5" (1.689 m)     Wt:  Wt Readings from Last 3 Encounters:  04/24/18 158 lb (71.7 kg)  04/22/18 159 lb 9.8 oz (72.4 kg)  04/11/18 158 lb (71.7 kg)     BMI: There is no height or weight on file to calculate BMI.     Current tobacco use? no  Labs:  Lipid Panel     Component Value Date/Time   CHOL 121 04/19/2017 0936   TRIG 114 04/19/2017 0936   HDL 46 04/19/2017 0936   CHOLHDL 2.6 04/19/2017 0936   CHOLHDL 3 05/09/2016 1216   VLDL 36.8 05/09/2016 1216   LDLCALC 52 04/19/2017 0936    Lab Results  Component Value Date   HGBA1C 5.9 (H) 12/27/2017   Note Spoke with pt. Pt is overweight, pt would like to lose weight. Pt has not been actively trying to lose weight. Pt eats 3 meals a day; most prepared outside of home. Discussed how to navigate eating out, and discussed that reducing number of meals consumed away from home would be helpful in her interest of losing 5 lbs.  Pt's Rate Your  Plate results reviewed with pt. Pt does not avoid salty food; uses canned/ convenience food.  Pt adds salt to food.  The role of sodium in lung disease reviewed with pt. Pt expressed understanding of the information reviewed. Pt is pre-diabetic, last A1c 5.9, educated pt on complex vs refined carbohydrates. Recommended pt replace refined carbohydrates with complex ones. Pt expressed understanding of the information reviewed via feedback method.    Nutrition Diagnosis ? Excessive sodium intake related to over consumption of processed food as evidenced by frequent consumption of convenience food/ canned vegetables and eating out frequently.   Nutrition Intervention ? Pt's individual nutrition plan and goals reviewed with pt. ? Benefits of adopting healthy eating habits discussed when pt's Rate Your Plate reviewed.  Goal(s) 1. Pt to identify and limit food sources of sodium, saturated fat, refined carbohydrates. 2. Identify food quantities necessary to achieve wt loss of  -1# per week to a goal wt loss of 5-7 lb at graduation from pulmonary rehab. 3. Pt to include fruits, vegetables, whole grains, lean protein, and low-fat dairy products in a healthy meal plan. 4. Pt able to name foods that affect blood glucose  Plan:  Pt to attend Pulmonary Nutrition class Will provide client-centered nutrition education as part of interdisciplinary care.    Monitor and Evaluate progress toward nutrition goal with team.   Laurina Bustle, MS, RD, LDN 05/06/2018 2:54 PM

## 2018-05-06 NOTE — Progress Notes (Signed)
Daily Session Note  Patient Details  Name: [name] MRN: [id number] Date of Birth: May 25, 1935 Referring Provider:     Pulmonary Rehab Walk Test from 04/10/2018 in [place]  Referring Provider  Dr. Elsworth Soho      Encounter Date: 05/06/2018  Check In: Session Check In - 05/06/18 1537      Check-In   Supervising physician immediately available to respond to emergencies  [facility] immediately available    Physician(s)  Dr. Karleen Hampshire    Location  [facility]    Staff Present  Maurice Small, RN, BSN;Molly DiVincenzo, MS, ACSM RCEP, Exercise Physiologist;Dominico Rod Ysidro Evert, RN;Maria Whitaker, RN, BSN    Medication changes reported      No    Fall or balance concerns reported     No    Tobacco Cessation  No Change    Warm-up and Cool-down  Performed as group-led Higher education careers adviser Performed  Yes    VAD Patient?  No    PAD/SET Patient?  No      Pain Assessment   Currently in Pain?  No/denies    Pain Score  0-No pain    Multiple Pain Sites  No       Capillary Blood Glucose: No results found for this or any previous visit (from the past 24 hour(s)).  Exercise Prescription Changes - 05/06/18 1500      Response to Exercise   Blood Pressure (Admit)  104/64    Blood Pressure (Exercise)  110/70    Blood Pressure (Exit)  120/60    Heart Rate (Admit)  68 bpm    Heart Rate (Exercise)  94 bpm    Heart Rate (Exit)  76 bpm    Oxygen Saturation (Admit)  98 %    Oxygen Saturation (Exercise)  92 %    Oxygen Saturation (Exit)  97 %    Rating of Perceived Exertion (Exercise)  13    Perceived Dyspnea (Exercise)  3    Duration  Progress to 45 minutes of aerobic exercise without signs/symptoms of physical distress    Intensity  THRR unchanged      Progression   Progression  Continue to progress workloads to maintain intensity without signs/symptoms of physical distress.      Resistance Training   Training Prescription   Yes    Weight  orange bands    Reps  10-15    Time  10 Minutes      Oxygen   Oxygen  Continuous    Liters  2      NuStep   Level  1    SPM  80    Minutes  34    METs  1.8      Track   Laps  9    Minutes  17       Social History   Tobacco Use  Smoking Status Former Smoker  . Packs/day: 1.00  . Years: 40.00  . Pack years: 40.00  . Types: Cigarettes  . Last attempt to quit: 03/20/2003  . Years since quitting: 15.1  Smokeless Tobacco Never Used    Goals Met:  Exercise tolerated well No report of cardiac concerns or symptoms Strength training completed today  Goals Unmet:  Not Applicable  Comments: Service time is from 1330 to 1515    Dr. Rush Farmer is Medical Director for Pulmonary Rehab at [facility].

## 2018-05-08 ENCOUNTER — Encounter (HOSPITAL_COMMUNITY)
Admission: RE | Admit: 2018-05-08 | Discharge: 2018-05-08 | Disposition: A | Discharge status: Home / Self Care | Payer: Medicare Other | Source: Ambulatory Visit | Attending: Pulmonary Disease | Admitting: Pulmonary Disease

## 2018-05-08 DIAGNOSIS — J9611 Chronic respiratory failure with hypoxia: Secondary | ICD-10-CM | POA: Diagnosis not present

## 2018-05-08 NOTE — Progress Notes (Addendum)
Daily Session Note  Patient Details  Name: [name] MRN: [id number] Date of Birth: Apr 06, 1935 Referring Provider:     Pulmonary Rehab Walk Test from 04/10/2018 in [place]  Referring Provider  Dr. Elsworth Soho      Encounter Date: 05/08/2018  Check In: Session Check In - 05/08/18 1519      Check-In   Supervising physician immediately available to respond to emergencies  [facility] immediately available    Physician(s)  Dr. Lars Mage     Location  [facility]    Staff Present  Rodney Langton, RN;Joann Rion, RN, BSN;Carlette Wilber Oliphant, RN, BSN;Ramon Dredge, RN, MHA    Medication changes reported      No    Fall or balance concerns reported     No    Tobacco Cessation  No Change    Warm-up and Cool-down  Performed as group-led instruction    Resistance Training Performed  Yes    VAD Patient?  No    PAD/SET Patient?  No      Pain Assessment   Currently in Pain?  No/denies       Capillary Blood Glucose: No results found for this or any previous visit (from the past 24 hour(s)).    Social History   Tobacco Use  Smoking Status Former Smoker  . Packs/day: 1.00  . Years: 40.00  . Pack years: 40.00  . Types: Cigarettes  . Last attempt to quit: 03/20/2003  . Years since quitting: 15.1  Smokeless Tobacco Never Used    Goals Met:  Exercise tolerated well No report of cardiac concerns or symptoms Strength training completed today  Goals Unmet:  Not Applicable  Comments: Service time is from 1330 to 1505    Dr. Rush Farmer is Medical Director for Pulmonary Rehab at [facility].

## 2018-05-09 DIAGNOSIS — H43812 Vitreous degeneration, left eye: Secondary | ICD-10-CM | POA: Diagnosis not present

## 2018-05-09 DIAGNOSIS — H2000 Unspecified acute and subacute iridocyclitis: Secondary | ICD-10-CM | POA: Diagnosis not present

## 2018-05-13 ENCOUNTER — Encounter (HOSPITAL_COMMUNITY)
Admission: RE | Admit: 2018-05-13 | Discharge: 2018-05-13 | Disposition: A | Discharge status: Home / Self Care | Payer: Medicare Other | Source: Ambulatory Visit | Attending: Pulmonary Disease | Admitting: Pulmonary Disease

## 2018-05-13 DIAGNOSIS — J9611 Chronic respiratory failure with hypoxia: Secondary | ICD-10-CM

## 2018-05-13 NOTE — Progress Notes (Signed)
Daily Session Note  Patient Details  Name: [name] MRN: [id number] Date of Birth: 1935/02/09 Referring Provider:     Pulmonary Rehab Walk Test from 04/10/2018 in [place]  Referring Provider  Dr. Elsworth Soho      Encounter Date: 05/13/2018  Check In: Session Check In - 05/13/18 1347      Check-In   Supervising physician immediately available to respond to emergencies  [facility] immediately available    Physician(s)  Dr. Darrick Meigs    Location  [facility]    Staff Present  Rodney Langton, RN;Carlette Wilber Oliphant, RN, BSN;Ramon Dredge, RN, MHA;Molly DiVincenzo, MS, ACSM RCEP, Exercise Physiologist    Medication changes reported      No    Fall or balance concerns reported     No    Tobacco Cessation  No Change    Warm-up and Cool-down  Performed as group-led instruction    Resistance Training Performed  Yes    VAD Patient?  No    PAD/SET Patient?  No      Pain Assessment   Currently in Pain?  No/denies    Multiple Pain Sites  No       Capillary Blood Glucose: No results found for this or any previous visit (from the past 24 hour(s)).    Social History   Tobacco Use  Smoking Status Former Smoker  . Packs/day: 1.00  . Years: 40.00  . Pack years: 40.00  . Types: Cigarettes  . Last attempt to quit: 03/20/2003  . Years since quitting: 15.1  Smokeless Tobacco Never Used    Goals Met:  Exercise tolerated well No report of cardiac concerns or symptoms Strength training completed today  Goals Unmet:  Not Applicable  Comments: Service time is from 1330 to 1530    Dr. Rush Farmer is Medical Director for Pulmonary Rehab at [facility].

## 2018-05-15 ENCOUNTER — Encounter (HOSPITAL_COMMUNITY): Payer: Medicare Other

## 2018-05-16 DIAGNOSIS — H2000 Unspecified acute and subacute iridocyclitis: Secondary | ICD-10-CM | POA: Diagnosis not present

## 2018-05-20 ENCOUNTER — Encounter (HOSPITAL_COMMUNITY)
Admission: RE | Admit: 2018-05-20 | Discharge: 2018-05-20 | Disposition: A | Discharge status: Home / Self Care | Payer: Medicare Other | Source: Ambulatory Visit | Attending: Pulmonary Disease | Admitting: Pulmonary Disease

## 2018-05-20 ENCOUNTER — Other Ambulatory Visit: Payer: Self-pay | Admitting: Internal Medicine

## 2018-05-20 VITALS — Wt 158.1 lb

## 2018-05-20 DIAGNOSIS — J9611 Chronic respiratory failure with hypoxia: Secondary | ICD-10-CM

## 2018-05-20 NOTE — Progress Notes (Signed)
Daily Session Note  Patient Details  Name: [name] MRN: [id number] Date of Birth: 09/09/1934 Referring Provider:     Pulmonary Rehab Walk Test from 04/10/2018 in [facility]  Referring Provider  Dr. Alva      Encounter Date: 05/20/2018  Check In: Session Check In - 05/20/18 1532      Check-In   Supervising physician immediately available to respond to emergencies  [facility] immediately available    Physician(s)  Dr. Ghimire    Location  [facility]    Staff Present  Carlette Carlton, RN, BSN;Aubrey Burklin, MS, RDN, LDN;Molly DiVincenzo, MS, ACSM RCEP, Exercise Physiologist;Annedrea Stackhouse, RN, MHA    Medication changes reported      No    Fall or balance concerns reported     No    Tobacco Cessation  No Change    Warm-up and Cool-down  Performed as group-led instruction    Resistance Training Performed  Yes    VAD Patient?  No    PAD/SET Patient?  No      Pain Assessment   Currently in Pain?  No/denies    Pain Score  0-No pain    Multiple Pain Sites  No       Capillary Blood Glucose: No results found for this or any previous visit (from the past 24 hour(s)).  Exercise Prescription Changes - 05/20/18 1600      Response to Exercise   Blood Pressure (Admit)  110/60    Blood Pressure (Exercise)  140/80    Blood Pressure (Exit)  106/60    Heart Rate (Admit)  66 bpm    Heart Rate (Exercise)  96 bpm    Heart Rate (Exit)  64 bpm    Oxygen Saturation (Admit)  94 %    Oxygen Saturation (Exercise)  97 %    Oxygen Saturation (Exit)  97 %    Rating of Perceived Exertion (Exercise)  13    Perceived Dyspnea (Exercise)  3    Duration  Progress to 45 minutes of aerobic exercise without signs/symptoms of physical distress    Intensity  THRR unchanged      Progression   Progression  Continue to progress workloads to maintain intensity without signs/symptoms of physical distress.      Resistance Training   Training Prescription  Yes    Weight  orange bands    Reps  10-15    Time  10 Minutes      Oxygen   Oxygen  Continuous    Liters  2      NuStep   Level  2    SPM  80    Minutes  34    METs  2      Track   Laps  10    Minutes  17       Social History   Tobacco Use  Smoking Status Former Smoker  . Packs/day: 1.00  . Years: 40.00  . Pack years: 40.00  . Types: Cigarettes  . Last attempt to quit: 03/20/2003  . Years since quitting: 15.1  Smokeless Tobacco Never Used    Goals Met:  Exercise tolerated well No report of cardiac concerns or symptoms Strength training completed today  Goals Unmet:  Not Applicable  Comments: Service time is from 1330 to 1515    Dr. Wesam G. Yacoub is Medical Director for Pulmonary Rehab at [facility]. 

## 2018-05-22 ENCOUNTER — Encounter (HOSPITAL_COMMUNITY)
Admission: RE | Admit: 2018-05-22 | Discharge: 2018-05-22 | Disposition: A | Discharge status: Home / Self Care | Payer: Medicare Other | Source: Ambulatory Visit | Attending: Pulmonary Disease | Admitting: Pulmonary Disease

## 2018-05-22 DIAGNOSIS — J9611 Chronic respiratory failure with hypoxia: Secondary | ICD-10-CM

## 2018-05-22 NOTE — Progress Notes (Signed)
Cardiac Individual Treatment Plan  Patient Details  Name: [name] MRN: [id number] Date of Birth: [birth year] Referring Provider:     Pulmonary Rehab Walk Test from 04/10/2018 in [place]  Referring Provider  Dr. Elsworth Soho      Initial Encounter Date:    Pulmonary Rehab Walk Test from 04/10/2018 in [place]  Date  04/11/18      Visit Diagnosis: Chronic respiratory failure with hypoxia ([place])  Patient's Home Medications on Admission:  Current Outpatient Medications:  .  albuterol (PROVENTIL HFA;VENTOLIN HFA) 108 (90 Base) MCG/ACT inhaler, Inhale 2 puffs into the lungs every 6 (six) hours as needed for wheezing or shortness of breath., Disp: 1 Inhaler, Rfl: 2 .  arformoterol (BROVANA) 15 MCG/2ML NEBU, Take 2 mLs (15 mcg total) by nebulization 2 (two) times daily., Disp: 120 mL, Rfl: 5 .  atorvastatin (LIPITOR) 10 MG tablet, TAKE ONE TABLET DAILY AT 6PM, Disp: 60 tablet, Rfl: 1 .  budesonide (PULMICORT) 0.5 MG/2ML nebulizer solution, Take 2 mLs (0.5 mg total) by nebulization 2 (two) times daily., Disp: 120 mL, Rfl: 5 .  DIGOX 125 MCG tablet, TAKE ONE TABLET DAILY, Disp: 90 tablet, Rfl: 0 .  diltiazem (CARDIZEM CD) 240 MG 24 hr capsule, TAKE ONE CAPSULE EACH DAY, Disp: 90 capsule, Rfl: 0 .  latanoprost (XALATAN) 0.005 % ophthalmic solution, Place 1 drop into both eyes at bedtime. Reported on 11/09/2015, Disp: , Rfl:  .  pantoprazole (PROTONIX) 40 MG tablet, TAKE ONE TABLET DAILY, Disp: 90 tablet, Rfl: 1 .  SIMBRINZA 1-0.2 % SUSP, Place 1 drop into both eyes 2 (two) times daily. , Disp: , Rfl: 33  Past Medical History: Past Medical History:  Diagnosis Date  . Arthritis   . COPD (chronic obstructive pulmonary disease) ([place])   . Dyspnea on exertion   . GERD (gastroesophageal reflux disease)   . Glaucoma of both eyes   . History of atrial fibrillation without current medication 2010   POST SURG 2010--  PER PT NO ISSUES  SINCE  . History of breast cancer    DX DCIS IN 2004--  S/P RIGHT MASTECTOMY , NO CHEMORADIATION---  NO RECURRENCE  . History of CHF (congestive heart failure)    POST SURG 2010  . History of hypertension   . History of shingles    02/ 2015--  back of neck and left flank--  no residual pain  . ICH (intracerebral hemorrhage) ([place]) 02/05/2015  . Nephrolithiasis   . Recurrent bladder papillary carcinoma ([place]) first dx 06/ 2014   s/p  turbt's  and instillation mitomycin c (chemo)//dx again in 2015-different type    Tobacco Use: Social History   Tobacco Use  Smoking Status Former Smoker  . Packs/day: 1.00  . Years: 40.00  . Pack years: 40.00  . Types: Cigarettes  . Last attempt to quit: 03/20/2003  . Years since quitting: 15.1  Smokeless Tobacco Never Used    Labs: Recent Chemical engineer    Labs for ITP Cardiac and Pulmonary Rehab Latest Ref Rng & Units 07/11/2015 11/09/2015 05/09/2016 04/19/2017 12/27/2017   Cholestrol 100 - 199 mg/dL 125 - 131 121 -   LDLCALC 0 - 99 mg/dL 59 - 48 52 -   HDL >39 mg/dL 48 - 46.80 46 -   Trlycerides 0 - 149 mg/dL 92 - 184.0(H) 114 -   Hemoglobin A1c 4.8 - 5.6 % - 6.1 6.0 - 5.9(H)   TCO2 0 -  100 mmol/L - - - - -      Capillary Blood Glucose: Lab Results  Component Value Date   GLUCAP 85 12/28/2017   GLUCAP 83 12/28/2017   GLUCAP 129 (H) 12/27/2017   GLUCAP 169 (H) 12/27/2017   GLUCAP 145 (H) 02/05/2015     Exercise Target Goals: Exercise Program Goal: Individual exercise prescription set using results from initial 6 min walk test and THRR while considering  patient's activity barriers and safety.   Exercise Prescription Goal: Initial exercise prescription builds to 30-45 minutes a day of aerobic activity, 2-3 days per week.  Home exercise guidelines will be given to patient during program as part of exercise prescription that the participant will acknowledge.  Activity Barriers & Risk Stratification: Activity Barriers & Cardiac Risk  Stratification - 04/09/18 1615      Activity Barriers & Cardiac Risk Stratification   Cardiac Risk Stratification  Low       6 Minute Walk: 6 Minute Walk    Row Name 04/11/18 0958         6 Minute Walk   Phase  Initial     Distance  700 feet     Walk Time  6 minutes     # of Rest Breaks  - 1 standing rest break for 30 seconds     MPH  1.33     METS  2     RPE  12     Perceived Dyspnea   2     Comments  used wheelchair/ used forehead probe/blurry vision during 23mt which resided/lost balance around corners due to stroke     Resting HR  68 bpm     Resting BP  104/70     Resting Oxygen Saturation   92 %     Exercise Oxygen Saturation  during 6 min walk  88 %     Max Ex. HR  89 bpm     Max Ex. BP  130/64       Interval HR   1 Minute HR  70     2 Minute HR  78     3 Minute HR  73     4 Minute HR  89     5 Minute HR  82     6 Minute HR  67     Interval Heart Rate?  Yes       Interval Oxygen   Interval Oxygen?  Yes     Baseline Oxygen Saturation %  92 %     1 Minute Oxygen Saturation %  88 %     1 Minute Liters of Oxygen  2 L     2 Minute Oxygen Saturation %  94 %     2 Minute Liters of Oxygen  3 L     3 Minute Oxygen Saturation %  92 %     3 Minute Liters of Oxygen  3 L     4 Minute Oxygen Saturation %  93 %     4 Minute Liters of Oxygen  3 L     5 Minute Oxygen Saturation %  96 %     5 Minute Liters of Oxygen  3 L     6 Minute Oxygen Saturation %  88 %     6 Minute Liters of Oxygen  3 L        Oxygen Initial Assessment: Oxygen Initial Assessment - 04/11/18 0957      Initial  6 min Walk   Oxygen Used  Continuous;E-Tanks    Liters per minute  3      Program Oxygen Prescription   Program Oxygen Prescription  Continuous;E-Tanks    Liters per minute  3    Comments  Needs to use forehead probe since fingers are cold and nails are always painted       Oxygen Re-Evaluation: Oxygen Re-Evaluation    Row Name 04/22/18 0736 05/20/18 0740           Program  Oxygen Prescription   Program Oxygen Prescription  Continuous;E-Tanks  Continuous;E-Tanks      Liters per minute  3  2      Comments  Needs to use forehead probe since fingers are cold and nails are always painted  Needs to use forehead probe since fingers are cold and nails are always painted        Home Oxygen   Home Oxygen Device  Home Concentrator;Portable Concentrator  Home Concentrator;Portable Concentrator      Sleep Oxygen Prescription  Continuous  Continuous      Liters per minute  2  2      Home Exercise Oxygen Prescription  Continuous  Continuous      Liters per minute  2  2      Home at Rest Exercise Oxygen Prescription  Continuous  Continuous      Liters per minute  2  2      Compliance with Home Oxygen Use  No  No        Goals/Expected Outcomes   Short Term Goals  To learn and exhibit compliance with exercise, home and travel O2 prescription;To learn and understand importance of monitoring SPO2 with pulse oximeter and demonstrate accurate use of the pulse oximeter.;To learn and understand importance of maintaining oxygen saturations>88%;To learn and demonstrate proper pursed lip breathing techniques or other breathing techniques.;To learn and demonstrate proper use of respiratory medications  To learn and exhibit compliance with exercise, home and travel O2 prescription;To learn and understand importance of monitoring SPO2 with pulse oximeter and demonstrate accurate use of the pulse oximeter.;To learn and understand importance of maintaining oxygen saturations>88%;To learn and demonstrate proper pursed lip breathing techniques or other breathing techniques.;To learn and demonstrate proper use of respiratory medications      Long  Term Goals  Exhibits compliance with exercise, home and travel O2 prescription;Verbalizes importance of monitoring SPO2 with pulse oximeter and return demonstration;Maintenance of O2 saturations>88%;Exhibits proper breathing techniques, such as pursed lip  breathing or other method taught during program session;Compliance with respiratory medication;Demonstrates proper use of MDI's  Exhibits compliance with exercise, home and travel O2 prescription;Verbalizes importance of monitoring SPO2 with pulse oximeter and return demonstration;Maintenance of O2 saturations>88%;Exhibits proper breathing techniques, such as pursed lip breathing or other method taught during program session;Compliance with respiratory medication;Demonstrates proper use of MDI's      Goals/Expected Outcomes  compliance and understanding  compliance and understanding         Oxygen Discharge (Final Oxygen Re-Evaluation): Oxygen Re-Evaluation - 05/20/18 0740      Program Oxygen Prescription   Program Oxygen Prescription  Continuous;E-Tanks    Liters per minute  2    Comments  Needs to use forehead probe since fingers are cold and nails are always painted      Home Oxygen   Home Oxygen Device  Home Concentrator;Portable Concentrator    Sleep Oxygen Prescription  Continuous    Liters per minute  2  Home Exercise Oxygen Prescription  Continuous    Liters per minute  2    Home at Rest Exercise Oxygen Prescription  Continuous    Liters per minute  2    Compliance with Home Oxygen Use  No      Goals/Expected Outcomes   Short Term Goals  To learn and exhibit compliance with exercise, home and travel O2 prescription;To learn and understand importance of monitoring SPO2 with pulse oximeter and demonstrate accurate use of the pulse oximeter.;To learn and understand importance of maintaining oxygen saturations>88%;To learn and demonstrate proper pursed lip breathing techniques or other breathing techniques.;To learn and demonstrate proper use of respiratory medications    Long  Term Goals  Exhibits compliance with exercise, home and travel O2 prescription;Verbalizes importance of monitoring SPO2 with pulse oximeter and return demonstration;Maintenance of O2 saturations>88%;Exhibits  proper breathing techniques, such as pursed lip breathing or other method taught during program session;Compliance with respiratory medication;Demonstrates proper use of MDI's    Goals/Expected Outcomes  compliance and understanding       Initial Exercise Prescription: Initial Exercise Prescription - 04/11/18 1000      Date of Initial Exercise RX and Referring Provider   Date  04/11/18    Referring Provider  Dr. Elsworth Soho      Oxygen   Oxygen  Continuous    Liters  3      Recumbant Bike   Level  1    RPM  10    Minutes  17      NuStep   Level  1    SPM  80    Minutes  17    METs  1.3      Prescription Details   Frequency (times per week)  2    Duration  Progress to 45 minutes of aerobic exercise without signs/symptoms of physical distress      Intensity   THRR 40-80% of Max Heartrate  56-112    Ratings of Perceived Exertion  11-13    Perceived Dyspnea  0-4      Progression   Progression  Continue progressive overload as per policy without signs/symptoms or physical distress.      Resistance Training   Training Prescription  Yes    Weight  orange bands    Reps  10-15       Perform Capillary Blood Glucose checks as needed.  Exercise Prescription Changes: Exercise Prescription Changes    Row Name 04/22/18 1600 05/06/18 1500 05/20/18 1600         Response to Exercise   Blood Pressure (Admit)  102/70  104/64  110/60     Blood Pressure (Exercise)  132/76  110/70  140/80     Blood Pressure (Exit)  102/70  120/60  106/60     Heart Rate (Admit)  67 bpm  68 bpm  66 bpm     Heart Rate (Exercise)  88 bpm  94 bpm  96 bpm     Heart Rate (Exit)  67 bpm  76 bpm  64 bpm     Oxygen Saturation (Admit)  95 %  98 %  94 %     Oxygen Saturation (Exercise)  92 %  92 %  97 %     Oxygen Saturation (Exit)  96 %  97 %  97 %     Rating of Perceived Exertion (Exercise)  _0 Perceived Dyspnea (Exercise)  2  3  3  Duration  Progress to 45 minutes of aerobic exercise without  signs/symptoms of physical distress  Progress to 45 minutes of aerobic exercise without signs/symptoms of physical distress  Progress to 45 minutes of aerobic exercise without signs/symptoms of physical distress     Intensity  Other (comment) 40-80% of HRR  THRR unchanged  THRR unchanged       Progression   Progression  Continue to progress workloads to maintain intensity without signs/symptoms of physical distress.  Continue to progress workloads to maintain intensity without signs/symptoms of physical distress.  Continue to progress workloads to maintain intensity without signs/symptoms of physical distress.       Resistance Training   Training Prescription  Yes  Yes  Yes     Weight  orange bands  orange bands  orange bands     Reps  10-15  10-15  10-15     Time  10 Minutes  10 Minutes  10 Minutes       Oxygen   Oxygen  Continuous  Continuous  Continuous     Liters  _0 NuStep   Level  _1 SPM  80  80  80     Minutes  34  34  34     METs  -  1.8  2       Track   Laps  _2 Minutes  _3 Exercise Comments:   Exercise Goals and Review: Exercise Goals    Row Name 04/09/18 1615             Exercise Goals   Increase Physical Activity  Yes       Intervention  Provide advice, education, support and counseling about physical activity/exercise needs.;Develop an individualized exercise prescription for aerobic and resistive training based on initial evaluation findings, risk stratification, comorbidities and participant's personal goals.       Expected Outcomes  Short Term: Attend rehab on a regular basis to increase amount of physical activity.;Long Term: Add in home exercise to make exercise part of routine and to increase amount of physical activity.;Long Term: Exercising regularly at least 3-5 days a week.       Increase Strength and Stamina  Yes       Intervention  Provide advice, education, support and counseling about physical  activity/exercise needs.;Develop an individualized exercise prescription for aerobic and resistive training based on initial evaluation findings, risk stratification, comorbidities and participant's personal goals.       Expected Outcomes  Short Term: Increase workloads from initial exercise prescription for resistance, speed, and METs.;Short Term: Perform resistance training exercises routinely during rehab and add in resistance training at home;Long Term: Improve cardiorespiratory fitness, muscular endurance and strength as measured by increased METs and functional capacity (6MWT)       Able to understand and use rate of perceived exertion (RPE) scale  Yes       Intervention  Provide education and explanation on how to use RPE scale       Expected Outcomes  Short Term: Able to use RPE daily in rehab to express subjective intensity level;Long Term:  Able to use RPE to guide intensity level when exercising independently       Able to understand and use Dyspnea scale  Yes  Intervention  Provide education and explanation on how to use Dyspnea scale       Expected Outcomes  Short Term: Able to use Dyspnea scale daily in rehab to express subjective sense of shortness of breath during exertion;Long Term: Able to use Dyspnea scale to guide intensity level when exercising independently       Knowledge and understanding of Target Heart Rate Range (THRR)  Yes       Intervention  Provide education and explanation of THRR including how the numbers were predicted and where they are located for reference       Expected Outcomes  Short Term: Able to state/look up THRR;Short Term: Able to use daily as guideline for intensity in rehab;Long Term: Able to use THRR to govern intensity when exercising independently       Understanding of Exercise Prescription  Yes       Intervention  Provide education, explanation, and written materials on patient's individual exercise prescription       Expected Outcomes  Short Term:  Able to explain program exercise prescription;Long Term: Able to explain home exercise prescription to exercise independently          Exercise Goals Re-Evaluation : Exercise Goals Re-Evaluation    Row Name 04/22/18 0736 05/20/18 0741           Exercise Goal Re-Evaluation   Exercise Goals Review  Increase Physical Activity;Increase Strength and Stamina;Able to understand and use rate of perceived exertion (RPE) scale;Able to understand and use Dyspnea scale;Knowledge and understanding of Target Heart Rate Range (THRR);Understanding of Exercise Prescription  Increase Physical Activity;Increase Strength and Stamina;Able to understand and use rate of perceived exertion (RPE) scale;Able to understand and use Dyspnea scale;Knowledge and understanding of Target Heart Rate Range (THRR);Understanding of Exercise Prescription      Comments  Patient's first day of exercise should be today.   Patient's progress will be slow due to extreme shortness of breath and deconditioning. Patient's average has increased from 1.5 to 1.9. Patient is able to walk 11 laps (200 ft each) in 15 minutes. Will cont to monitor and motivate.      Expected Outcomes  Through rehab and exercising at home, patient will find it easier to carry out ADL's and will gain the confidence to establish an exercise routine at home.   Through rehab and exercising at home, patient will find it easier to carry out ADL's and will gain the confidence to establish an exercise routine at home.          Discharge Exercise Prescription (Final Exercise Prescription Changes): Exercise Prescription Changes - 05/20/18 1600      Response to Exercise   Blood Pressure (Admit)  110/60    Blood Pressure (Exercise)  140/80    Blood Pressure (Exit)  106/60    Heart Rate (Admit)  66 bpm    Heart Rate (Exercise)  96 bpm    Heart Rate (Exit)  64 bpm    Oxygen Saturation (Admit)  94 %    Oxygen Saturation (Exercise)  97 %    Oxygen Saturation (Exit)  97 %     Rating of Perceived Exertion (Exercise)  13    Perceived Dyspnea (Exercise)  3    Duration  Progress to 45 minutes of aerobic exercise without signs/symptoms of physical distress    Intensity  THRR unchanged      Progression   Progression  Continue to progress workloads to maintain intensity without signs/symptoms of physical distress.  Resistance Training   Training Prescription  Yes    Weight  orange bands    Reps  10-15    Time  10 Minutes      Oxygen   Oxygen  Continuous    Liters  2      NuStep   Level  2    SPM  80    Minutes  34    METs  2      Track   Laps  10    Minutes  17       Nutrition:  Target Goals: Understanding of nutrition guidelines, daily intake of sodium <1531m, cholesterol <2088m calories 30% from fat and 7% or less from saturated fats, daily to have 5 or more servings of fruits and vegetables.  Biometrics:    Nutrition Therapy Plan and Nutrition Goals: Nutrition Therapy & Goals - 04/10/18 1403      Nutrition Therapy   Diet  consistent carbohydrate heart healthy      Personal Nutrition Goals   Nutrition Goal  Pt to identify and limit food sources of sodium, saturated fat, refined carbohydrates    Personal Goal #2  Identify food quantities necessary to achieve wt loss of  -1# per week to a goal wt loss of 5-7 lb at graduation from pulmonary rehab.    Personal Goal #3  Pt able to name foods that affect blood glucose       Intervention Plan   Intervention  Prescribe, educate and counsel regarding individualized specific dietary modifications aiming towards targeted core components such as weight, hypertension, lipid management, diabetes, heart failure and other comorbidities.    Expected Outcomes  Short Term Goal: Understand basic principles of dietary content, such as calories, fat, sodium, cholesterol and nutrients.       Nutrition Assessments: Nutrition Assessments - 04/10/18 1404      Rate Your Plate Scores   Pre Score  43        Nutrition Goals Re-Evaluation:   Nutrition Goals Re-Evaluation:   Nutrition Goals Discharge (Final Nutrition Goals Re-Evaluation):   Psychosocial: Target Goals: Acknowledge presence or absence of significant depression and/or stress, maximize coping skills, provide positive support system. Participant is able to verbalize types and ability to use techniques and skills needed for reducing stress and depression.  Initial Review & Psychosocial Screening: Initial Psych Review & Screening - 04/09/18 1611      Family Dynamics   Comments  pt with no identifiable barriers.  Pt denes any stress or anxiety       Quality of Life Scores:  Scores of 19 and below usually indicate a poorer quality of life in these areas.  A difference of  2-3 points is a clinically meaningful difference.  A difference of 2-3 points in the total score of the Quality of Life Index has been associated with significant improvement in overall quality of life, self-image, physical symptoms, and general health in studies assessing change in quality of life.  PHQ-9: Recent Review Flowsheet Data    [facility] 04/09/2018 12/27/2017 11/12/2015 05/20/2015 04/22/2015   Decreased Interest 0 0 0 0 0   Down, Depressed, Hopeless 0 0 0 0 0   PHQ - 2 Score 0 0 0 0 0   Altered sleeping 0 - - - 1   Tired, decreased energy 1 - - - 0   Change in appetite 0 - - - 0   Feeling bad or failure about yourself  0 - - -  0   Trouble concentrating 0 - - - 0   Moving slowly or fidgety/restless 0 - - - 0   Suicidal thoughts 0 - - - 0   PHQ-9 Score 1 - - - 1   Difficult doing work/chores Not difficult at all - - - -     Interpretation of Total Score  Total Score Depression Severity:  1-4 = Minimal depression, 5-9 = Mild depression, 10-14 = Moderate depression, 15-19 = Moderately severe depression, 20-27 = Severe depression   Psychosocial Evaluation and Intervention: Psychosocial Evaluation - 05/22/18 1321       Psychosocial Evaluation & Interventions   Interventions  Stress management education;Relaxation education;Encouraged to exercise with the program and follow exercise prescription    Comments  Pt has positive and healthy coping skills with supportive family. Daughter will sometime accompany her to exercise.    Expected Outcomes  Pt will continue to demonstrate positive coping skills and healthy outlook.    Continue Psychosocial Services   Follow up required by staff       Psychosocial Re-Evaluation: Psychosocial Re-Evaluation    [place] Name 05/22/18 1321             Psychosocial Re-Evaluation   Current issues with  None Identified       Comments  pt reports no barriers to participating in pulmonary rehab.  Pt appears to enjoy coming to exericse and is beginning to feel better       Expected Outcomes  Pt will continue mental well being and adopt exercise as a part of her routine       Interventions  Stress management education;Encouraged to attend Pulmonary Rehabilitation for the exercise;Relaxation education       Continue Psychosocial Services   Follow up required by staff          Psychosocial Discharge (Final Psychosocial Re-Evaluation): Psychosocial Re-Evaluation - 05/22/18 1321      Psychosocial Re-Evaluation   Current issues with  None Identified    Comments  pt reports no barriers to participating in pulmonary rehab.  Pt appears to enjoy coming to exericse and is beginning to feel better    Expected Outcomes  Pt will continue mental well being and adopt exercise as a part of her routine    Interventions  Stress management education;Encouraged to attend Pulmonary Rehabilitation for the exercise;Relaxation education    Continue Psychosocial Services   Follow up required by staff       Vocational Rehabilitation: Provide vocational rehab assistance to qualifying candidates.   Vocational Rehab Evaluation & Intervention: Vocational Rehab - 04/09/18 1615      Initial Vocational  Rehab Evaluation & Intervention   Assessment shows need for Vocational Rehabilitation  No       Education: Education Goals: Education classes will be provided on a weekly basis, covering required topics. Participant will state understanding/return demonstration of topics presented.  Learning Barriers/Preferences: Learning Barriers/Preferences - 04/09/18 1440      Learning Barriers/Preferences   Learning Barriers  Sight    Learning Preferences  Individual Instruction;Group Instruction;Verbal Instruction;Written Material       Education Topics: Count Your Pulse:  -Group instruction provided by verbal instruction, demonstration, patient participation and written materials to support subject.  Instructors address importance of being able to find your pulse and how to count your pulse when at home without a heart monitor.  Patients get hands on experience counting their pulse with staff help and individually.   Heart Attack, Angina, and Risk  Factor Modification:  -Group instruction provided by verbal instruction, video, and written materials to support subject.  Instructors address signs and symptoms of angina and heart attacks.    Also discuss risk factors for heart disease and how to make changes to improve heart health risk factors.   Functional Fitness:  -Group instruction provided by verbal instruction, demonstration, patient participation, and written materials to support subject.  Instructors address safety measures for doing things around the house.  Discuss how to get up and down off the floor, how to pick things up properly, how to safely get out of a chair without assistance, and balance training.   Meditation and Mindfulness:  -Group instruction provided by verbal instruction, patient participation, and written materials to support subject.  Instructor addresses importance of mindfulness and meditation practice to help reduce stress and improve awareness.  Instructor also leads  participants through a meditation exercise.    Stretching for Flexibility and Mobility:  -Group instruction provided by verbal instruction, patient participation, and written materials to support subject.  Instructors lead participants through series of stretches that are designed to increase flexibility thus improving mobility.  These stretches are additional exercise for major muscle groups that are typically performed during regular warm up and cool down.   Hands Only CPR:  -Group verbal, video, and participation provides a basic overview of AHA guidelines for community CPR. Role-play of emergencies allow participants the opportunity to practice calling for help and chest compression technique with discussion of AED use.   Hypertension: -Group verbal and written instruction that provides a basic overview of hypertension including the most recent diagnostic guidelines, risk factor reduction with self-care instructions and medication management.    Nutrition I class: Heart Healthy Eating:  -Group instruction provided by PowerPoint slides, verbal discussion, and written materials to support subject matter. The instructor gives an explanation and review of the Therapeutic Lifestyle Changes diet recommendations, which includes a discussion on lipid goals, dietary fat, sodium, fiber, plant stanol/sterol esters, sugar, and the components of a well-balanced, healthy diet.   Nutrition II class: Lifestyle Skills:  -Group instruction provided by PowerPoint slides, verbal discussion, and written materials to support subject matter. The instructor gives an explanation and review of label reading, grocery shopping for heart health, heart healthy recipe modifications, and ways to make healthier choices when eating out.   Diabetes Question & Answer:  -Group instruction provided by PowerPoint slides, verbal discussion, and written materials to support subject matter. The instructor gives an explanation  and review of diabetes co-morbidities, pre- and post-prandial blood glucose goals, pre-exercise blood glucose goals, signs, symptoms, and treatment of hypoglycemia and hyperglycemia, and foot care basics.   Diabetes Blitz:  -Group instruction provided by PowerPoint slides, verbal discussion, and written materials to support subject matter. The instructor gives an explanation and review of the physiology behind type 1 and type 2 diabetes, diabetes medications and rational behind using different medications, pre- and post-prandial blood glucose recommendations and Hemoglobin A1c goals, diabetes diet, and exercise including blood glucose guidelines for exercising safely.    Portion Distortion:  -Group instruction provided by PowerPoint slides, verbal discussion, written materials, and food models to support subject matter. The instructor gives an explanation of serving size versus portion size, changes in portions sizes over the last 20 years, and what consists of a serving from each food group.   Stress Management:  -Group instruction provided by verbal instruction, video, and written materials to support subject matter.  Instructors review role of stress  in heart disease and how to cope with stress positively.     Exercising on Your Own:  -Group instruction provided by verbal instruction, power point, and written materials to support subject.  Instructors discuss benefits of exercise, components of exercise, frequency and intensity of exercise, and end points for exercise.  Also discuss use of nitroglycerin and activating EMS.  Review options of places to exercise outside of rehab.  Review guidelines for sex with heart disease.   Cardiac Drugs I:  -Group instruction provided by verbal instruction and written materials to support subject.  Instructor reviews cardiac drug classes: antiplatelets, anticoagulants, beta blockers, and statins.  Instructor discusses reasons, side effects, and lifestyle  considerations for each drug class.   Cardiac Drugs II:  -Group instruction provided by verbal instruction and written materials to support subject.  Instructor reviews cardiac drug classes: angiotensin converting enzyme inhibitors (ACE-I), angiotensin II receptor blockers (ARBs), nitrates, and calcium channel blockers.  Instructor discusses reasons, side effects, and lifestyle considerations for each drug class.   Anatomy and Physiology of the Circulatory System:  Group verbal and written instruction and models provide basic cardiac anatomy and physiology, with the coronary electrical and arterial systems. Review of: AMI, Angina, Valve disease, Heart Failure, Peripheral Artery Disease, Cardiac Arrhythmia, Pacemakers, and the ICD.   Other Education:  -Group or individual verbal, written, or video instructions that support the educational goals of the cardiac rehab program.   Holiday Eating Survival Tips:  -Group instruction provided by PowerPoint slides, verbal discussion, and written materials to support subject matter. The instructor gives patients tips, tricks, and techniques to help them not only survive but enjoy the holidays despite the onslaught of food that accompanies the holidays.   Knowledge Questionnaire Score: Knowledge Questionnaire Score - 04/15/18 1652      Knowledge Questionnaire Score   Pre Score  12/18       Core Components/Risk Factors/Patient Goals at Admission: Personal Goals and Risk Factors at Admission - 04/09/18 1436      Core Components/Risk Factors/Patient Goals on Admission    Weight Management  Yes;Weight Loss    Intervention  Weight Management: Develop a combined nutrition and exercise program designed to reach desired caloric intake, while maintaining appropriate intake of nutrient and fiber, sodium and fats, and appropriate energy expenditure required for the weight goal.;Weight Management: Provide education and appropriate resources to help  participant work on and attain dietary goals.;Weight Management/Obesity: Establish reasonable short term and long term weight goals.    Admit Weight  157 lb 13.6 oz (71.6 kg)    Goal Weight: Short Term  153 lb (69.4 kg)    Goal Weight: Long Term  150 lb (68 kg)    Expected Outcomes  Short Term: Continue to assess and modify interventions until short term weight is achieved;Long Term: Adherence to nutrition and physical activity/exercise program aimed toward attainment of established weight goal;Weight Loss: Understanding of general recommendations for a balanced deficit meal plan, which promotes 1-2 lb weight loss per week and includes a negative energy balance of [phone] kcal/d;Understanding recommendations for meals to include 15-35% energy as protein, 25-35% energy from fat, 35-60% energy from carbohydrates, less than 27m of dietary cholesterol, 20-35 gm of total fiber daily;Understanding of distribution of calorie intake throughout the day with the consumption of 4-5 meals/snacks    Improve shortness of breath with ADL's  Yes    Intervention  Provide education, individualized exercise plan and daily activity instruction to help decrease symptoms of SOB with  activities of daily living.    Expected Outcomes  Short Term: Improve cardiorespiratory fitness to achieve a reduction of symptoms when performing ADLs;Long Term: Be able to perform more ADLs without symptoms or delay the onset of symptoms       Core Components/Risk Factors/Patient Goals Review:  Goals and Risk Factor Review    Row Name 04/23/18 1202 05/22/18 1322           Core Components/Risk Factors/Patient Goals Review   Personal Goals Review  Weight Management/Obesity;Improve shortness of breath with ADL's;Increase knowledge of respiratory medications and ability to use respiratory devices properly.;Develop more efficient breathing techniques such as purse lipped breathing and diaphragmatic breathing and practicing self-pacing with  activity.  Weight Management/Obesity;Improve shortness of breath with ADL's;Increase knowledge of respiratory medications and ability to use respiratory devices properly.;Develop more efficient breathing techniques such as purse lipped breathing and diaphragmatic breathing and practicing self-pacing with activity.      Review  Pt has completed 1 exercise session on 8/20.  Unable to adequately assess at this time pt progress toward her patient goals  Pt has completed 7 exercise sessions, Pt has attended the respiratory medication class. Pt with weight loss of .7 kg since 8/22.  Pt with increase to level 2 on the nustep and averages 10 laps in 15 minutes.  Pt rates her dyspnea level 2.  I anticipate with consistent attendance, pt will show measurable progress in the next 30 day assessment.      Expected Outcomes  See Admission Outcomes/Goals  See Admission Outcomes/Goals         Core Components/Risk Factors/Patient Goals at Discharge (Final Review):  Goals and Risk Factor Review - 05/22/18 1322      Core Components/Risk Factors/Patient Goals Review   Personal Goals Review  Weight Management/Obesity;Improve shortness of breath with ADL's;Increase knowledge of respiratory medications and ability to use respiratory devices properly.;Develop more efficient breathing techniques such as purse lipped breathing and diaphragmatic breathing and practicing self-pacing with activity.    Review  Pt has completed 7 exercise sessions, Pt has attended the respiratory medication class. Pt with weight loss of .7 kg since 8/22.  Pt with increase to level 2 on the nustep and averages 10 laps in 15 minutes.  Pt rates her dyspnea level 2.  I anticipate with consistent attendance, pt will show measurable progress in the next 30 day assessment.    Expected Outcomes  See Admission Outcomes/Goals       ITP Comments: ITP Comments    Row Name 04/09/18 1431 04/23/18 1201 05/22/18 1320       ITP Comments  Dr. Jennet Maduro,  Medical Director  Dr. Jennet Maduro, Medical Director  Dr. Jennet Maduro, Medical Director Pulmonary Rehab        Comments: Pt has completed 7 exercise sessions.  Continue to monitor. Cherre Huger, BSN Cardiac and Training and development officer

## 2018-05-22 NOTE — Progress Notes (Signed)
Daily Session Note  Patient Details  Name: [name] MRN: [id number] Date of Birth: May 13, 1935 Referring Provider:     Pulmonary Rehab Walk Test from 04/10/2018 in [place]  Referring Provider  Dr. Elsworth Soho      Encounter Date: 05/22/2018  Check In: Session Check In - 05/22/18 1355      Check-In   Supervising physician immediately available to respond to emergencies  [facility] immediately available    Physician(s)  Dr. Toma Copier    Location  [facility]    Staff Present  Maurice Small, RN, BSN;Molly DiVincenzo, MS, ACSM RCEP, Exercise Physiologist;Jenavie Stanczak Ysidro Evert, Felipe Drone, RN, MHA    Medication changes reported      No    Fall or balance concerns reported     No    Tobacco Cessation  No Change    Warm-up and Cool-down  Performed as group-led instruction    Resistance Training Performed  Yes    VAD Patient?  No    PAD/SET Patient?  No      Pain Assessment   Currently in Pain?  No/denies    Pain Score  0-No pain    Multiple Pain Sites  No       Capillary Blood Glucose: No results found for this or any previous visit (from the past 24 hour(s)).    Social History   Tobacco Use  Smoking Status Former Smoker  . Packs/day: 1.00  . Years: 40.00  . Pack years: 40.00  . Types: Cigarettes  . Last attempt to quit: 03/20/2003  . Years since quitting: 15.1  Smokeless Tobacco Never Used    Goals Met:  Exercise tolerated well No report of cardiac concerns or symptoms Strength training completed today  Goals Unmet:  Not Applicable  Comments: Service time is from 1330 to 1525    Dr. Rush Farmer is Medical Director for Pulmonary Rehab at [facility].

## 2018-05-27 ENCOUNTER — Encounter (HOSPITAL_COMMUNITY)
Admission: RE | Admit: 2018-05-27 | Discharge: 2018-05-27 | Disposition: A | Discharge status: Home / Self Care | Payer: Medicare Other | Source: Ambulatory Visit | Attending: Pulmonary Disease | Admitting: Pulmonary Disease

## 2018-05-27 DIAGNOSIS — J9611 Chronic respiratory failure with hypoxia: Secondary | ICD-10-CM | POA: Diagnosis not present

## 2018-05-27 NOTE — Progress Notes (Signed)
Daily Session Note  Patient Details  Name: [name] MRN: [id number] Date of Birth: 12-12-1934 Referring Provider:     Pulmonary Rehab Walk Test from 04/10/2018 in [place]  Referring Provider  Dr. Elsworth Soho      Encounter Date: 05/27/2018  Check In: Session Check In - 05/27/18 1419      Check-In   Supervising physician immediately available to respond to emergencies  [facility] immediately available    Physician(s)  Dr.Danford     Location  [facility]    Staff Present  Maurice Small, RN, BSN;Derra Shartzer, MS, ACSM RCEP, Exercise Physiologist;Annedrea Rosezella [place], RN, MHA;Maria Whitaker, RN, BSN    Medication changes reported      No    Fall or balance concerns reported     No    Tobacco Cessation  No Change    Warm-up and Cool-down  Performed as group-led Higher education careers adviser Performed  Yes    VAD Patient?  No    PAD/SET Patient?  No      Pain Assessment   Currently in Pain?  No/denies       Capillary Blood Glucose: No results found for this or any previous visit (from the past 24 hour(s)).    Social History   Tobacco Use  Smoking Status Former Smoker  . Packs/day: 1.00  . Years: 40.00  . Pack years: 40.00  . Types: Cigarettes  . Last attempt to quit: 03/20/2003  . Years since quitting: 15.1  Smokeless Tobacco Never Used    Goals Met:  Exercise tolerated well  Goals Unmet:  Not Applicable  Comments: Service time is from 1:30p to 3:15p    Dr. Rush Farmer is Medical Director for Pulmonary Rehab at [facility].

## 2018-05-29 ENCOUNTER — Encounter (HOSPITAL_COMMUNITY)
Admission: RE | Admit: 2018-05-29 | Discharge: 2018-05-29 | Disposition: A | Discharge status: Home / Self Care | Payer: Medicare Other | Source: Ambulatory Visit | Attending: Pulmonary Disease | Admitting: Pulmonary Disease

## 2018-05-29 DIAGNOSIS — J9611 Chronic respiratory failure with hypoxia: Secondary | ICD-10-CM | POA: Diagnosis not present

## 2018-05-29 NOTE — Progress Notes (Signed)
Daily Session Note  Patient Details  Name: [name] MRN: [id number] Date of Birth: [birth year] Referring Provider:     Pulmonary Rehab Walk Test from 04/10/2018 in [place]  Referring Provider  Dr. Elsworth Soho      Encounter Date: 05/29/2018  Check In: Session Check In - 05/29/18 1409      Check-In   Supervising physician immediately available to respond to emergencies  [facility] immediately available    Physician(s)  Dr.Danford     Location  [facility]    Staff Present  Maurice Small, RN, BSN;Molly DiVincenzo, MS, ACSM RCEP, Exercise Physiologist;Annedrea Stackhouse, RN, MHA    Medication changes reported      No    Fall or balance concerns reported     No    Tobacco Cessation  No Change    Warm-up and Cool-down  Performed as group-led Higher education careers adviser Performed  Yes    VAD Patient?  No    PAD/SET Patient?  No      Pain Assessment   Currently in Pain?  No/denies    Multiple Pain Sites  No       Capillary Blood Glucose: No results found for this or any previous visit (from the past 24 hour(s)).    Social History   Tobacco Use  Smoking Status Former Smoker  . Packs/day: 1.00  . Years: 40.00  . Pack years: 40.00  . Types: Cigarettes  . Last attempt to quit: 03/20/2003  . Years since quitting: 15.2  Smokeless Tobacco Never Used    Goals Met:  Exercise tolerated well  Goals Unmet:  Not Applicable  Comments: Service time is from 1:30p to 3:30p    Dr. Rush Farmer is Medical Director for Pulmonary Rehab at [facility].

## 2018-05-29 NOTE — Progress Notes (Signed)
While exercising at Pulmonary Rehab, [name] consistently uses 2 liters of continuous flow oxygen. This is provided by [facility]. At home, the patient currently uses POC for their home oxygen system. While exercising at home, the patient was instructed to use 2 liters. In Pulmonary Rehab today, [name] walked the track utilizing their own home oxygen system. They used 3 liters pulsed which maintained their oxygen saturation above 89%. 2LP could only hold her sat at 87%.

## 2018-06-03 ENCOUNTER — Encounter (HOSPITAL_COMMUNITY)
Admission: RE | Admit: 2018-06-03 | Discharge: 2018-06-03 | Disposition: A | Discharge status: Home / Self Care | Payer: Medicare Other | Source: Ambulatory Visit | Attending: Pulmonary Disease | Admitting: Pulmonary Disease

## 2018-06-03 DIAGNOSIS — J9611 Chronic respiratory failure with hypoxia: Secondary | ICD-10-CM | POA: Diagnosis not present

## 2018-06-03 NOTE — Progress Notes (Signed)
Daily Session Note  Patient Details  Name: [name] MRN: [id number] Date of Birth: 05/20/1935 Referring Provider:     Pulmonary Rehab Walk Test from 04/10/2018 in [facility]  Referring Provider  Dr. Alva      Encounter Date: 06/03/2018  Check In: Session Check In - 06/03/18 1457      Check-In   Supervising physician immediately available to respond to emergencies  [facility] immediately available    Physician(s)  Dr. Pokhrel    Location  [facility]    Staff Present  Carlette Carlton, RN, BSN;Molly DiVincenzo, MS, ACSM RCEP, Exercise Physiologist;Annedrea Stackhouse, RN, MHA;Lisa Hughes, RN    Medication changes reported      No    Fall or balance concerns reported     No    Tobacco Cessation  No Change    Warm-up and Cool-down  Performed as group-led instruction    Resistance Training Performed  Yes    VAD Patient?  No    PAD/SET Patient?  No      Pain Assessment   Currently in Pain?  No/denies       Capillary Blood Glucose: No results found for this or any previous visit (from the past 24 hour(s)).  Exercise Prescription Changes - 06/03/18 1600      Response to Exercise   Blood Pressure (Admit)  95/58    Blood Pressure (Exercise)  110/68    Blood Pressure (Exit)  113/69    Heart Rate (Admit)  67 bpm    Heart Rate (Exercise)  81 bpm    Heart Rate (Exit)  87 bpm    Oxygen Saturation (Admit)  97 %    Oxygen Saturation (Exercise)  88 %    Oxygen Saturation (Exit)  98 %    Rating of Perceived Exertion (Exercise)  11    Perceived Dyspnea (Exercise)  2    Duration  Progress to 45 minutes of aerobic exercise without signs/symptoms of physical distress    Intensity  THRR unchanged      Progression   Progression  Continue to progress workloads to maintain intensity without signs/symptoms of physical distress.      Resistance Training   Training Prescription  Yes    Weight  orange bands    Reps  10-15    Time  10 Minutes      Oxygen   Oxygen  Continuous    Liters  2      NuStep   Level  2    SPM  80    Minutes  34    METs  1.9      Track   Laps  9    Minutes  17       Social History   Tobacco Use  Smoking Status Former Smoker  . Packs/day: 1.00  . Years: 40.00  . Pack years: 40.00  . Types: Cigarettes  . Last attempt to quit: 03/20/2003  . Years since quitting: 15.2  Smokeless Tobacco Never Used    Goals Met:  Exercise tolerated well No report of cardiac concerns or symptoms Strength training completed today  Goals Unmet:  Not Applicable  Comments: Service time is from 1330 to 1515    Dr. Wesam G. Yacoub is Medical Director for Pulmonary Rehab at [facility]. 

## 2018-06-05 ENCOUNTER — Encounter (HOSPITAL_COMMUNITY)
Admission: RE | Admit: 2018-06-05 | Discharge: 2018-06-05 | Disposition: A | Discharge status: Home / Self Care | Payer: Medicare Other | Source: Ambulatory Visit | Attending: Pulmonary Disease | Admitting: Pulmonary Disease

## 2018-06-05 DIAGNOSIS — J9611 Chronic respiratory failure with hypoxia: Secondary | ICD-10-CM

## 2018-06-05 NOTE — Progress Notes (Signed)
Daily Session Note  Patient Details  Name: [name] MRN: [id number] Date of Birth: 1935/07/08 Referring Provider:     Pulmonary Rehab Walk Test from 04/10/2018 in [place]  Referring Provider  Dr. Elsworth Soho      Encounter Date: 06/05/2018  Check In: Session Check In - 06/05/18 1406      Check-In   Supervising physician immediately available to respond to emergencies  [facility] immediately available    Physician(s)  Dr. Horris Latino    Location  [facility]    Staff Present  Maurice Small, RN, BSN;Molly DiVincenzo, MS, ACSM RCEP, Exercise Physiologist;Lisa Ysidro Evert, Felipe Drone, RN, MHA    Medication changes reported      No    Fall or balance concerns reported     No    Tobacco Cessation  No Change    Warm-up and Cool-down  Performed as group-led instruction    Resistance Training Performed  Yes    VAD Patient?  No    PAD/SET Patient?  No      Pain Assessment   Currently in Pain?  No/denies    Pain Score  0-No pain    Multiple Pain Sites  No       Capillary Blood Glucose: No results found for this or any previous visit (from the past 24 hour(s)).    Social History   Tobacco Use  Smoking Status Former Smoker  . Packs/day: 1.00  . Years: 40.00  . Pack years: 40.00  . Types: Cigarettes  . Last attempt to quit: 03/20/2003  . Years since quitting: 15.2  Smokeless Tobacco Never Used    Goals Met:  Exercise tolerated well No report of cardiac concerns or symptoms Strength training completed today  Goals Unmet:  Not Applicable  Comments: Service time is from 1330 to 1530     Dr. Rush Farmer is Medical Director for Pulmonary Rehab at [facility].

## 2018-06-09 ENCOUNTER — Telehealth: Payer: Self-pay | Admitting: Internal Medicine

## 2018-06-09 DIAGNOSIS — J9611 Chronic respiratory failure with hypoxia: Secondary | ICD-10-CM

## 2018-06-09 DIAGNOSIS — H401134 Primary open-angle glaucoma, bilateral, indeterminate stage: Secondary | ICD-10-CM | POA: Diagnosis not present

## 2018-06-09 NOTE — Telephone Encounter (Signed)
Copied from [place] [phone]. Topic: Quick Communication - See Telephone Encounter >> Jun 09, 2018  4:01 PM Vernona Rieger wrote: CRM for notification. See Telephone encounter for: 06/09/18.  Patient is requesting a " portable peak flow meter ". She would like that sent to [facility] fax number [phone].

## 2018-06-10 ENCOUNTER — Ambulatory Visit (INDEPENDENT_AMBULATORY_CARE_PROVIDER_SITE_OTHER): Payer: Medicare Other | Admitting: *Deleted

## 2018-06-10 DIAGNOSIS — C670 Malignant neoplasm of trigone of bladder: Secondary | ICD-10-CM | POA: Diagnosis not present

## 2018-06-10 DIAGNOSIS — Z23 Encounter for immunization: Secondary | ICD-10-CM | POA: Diagnosis not present

## 2018-06-10 DIAGNOSIS — N200 Calculus of kidney: Secondary | ICD-10-CM | POA: Diagnosis not present

## 2018-06-10 DIAGNOSIS — N3000 Acute cystitis without hematuria: Secondary | ICD-10-CM | POA: Diagnosis not present

## 2018-06-10 NOTE — Telephone Encounter (Signed)
Order entered, printed, signed and faxed.

## 2018-06-12 ENCOUNTER — Encounter (HOSPITAL_COMMUNITY): Payer: Medicare Other

## 2018-06-17 ENCOUNTER — Encounter (HOSPITAL_COMMUNITY)
Admission: RE | Admit: 2018-06-17 | Discharge: 2018-06-17 | Disposition: A | Discharge status: Home / Self Care | Payer: Medicare Other | Source: Ambulatory Visit | Attending: Pulmonary Disease | Admitting: Pulmonary Disease

## 2018-06-17 VITALS — Wt 157.8 lb

## 2018-06-17 DIAGNOSIS — J9611 Chronic respiratory failure with hypoxia: Secondary | ICD-10-CM | POA: Diagnosis not present

## 2018-06-17 NOTE — Progress Notes (Signed)
Daily Session Note  Patient Details  Name: [name] MRN: [id number] Date of Birth: 1934-12-04 Referring Provider:     Pulmonary Rehab Walk Test from 04/10/2018 in [place]  Referring Provider  Dr. Elsworth Soho      Encounter Date: 06/17/2018  Check In: Session Check In - 06/17/18 1330      Check-In   Supervising physician immediately available to respond to emergencies  [facility] immediately available    Physician(s)   Dr. Herbert Moors    Location  [facility]    Staff Present  Su Hilt, MS, ACSM RCEP, Exercise Physiologist;Dalton Kris Mouton, MS, Exercise Physiologist;Lisa Colletta [place], RN, MHA    Medication changes reported      No    Fall or balance concerns reported     No    Tobacco Cessation  No Change    Warm-up and Cool-down  Performed as group-led instruction    Resistance Training Performed  Yes    VAD Patient?  No    PAD/SET Patient?  No      Pain Assessment   Currently in Pain?  No/denies    Multiple Pain Sites  No       Capillary Blood Glucose: No results found for this or any previous visit (from the past 24 hour(s)).  Exercise Prescription Changes - 06/17/18 1600      Response to Exercise   Blood Pressure (Admit)  104/64    Blood Pressure (Exercise)  100/64    Blood Pressure (Exit)  112/78    Heart Rate (Admit)  76 bpm    Heart Rate (Exercise)  83 bpm    Heart Rate (Exit)  85 bpm    Oxygen Saturation (Admit)  95 %    Oxygen Saturation (Exercise)  92 %    Oxygen Saturation (Exit)  96 %    Rating of Perceived Exertion (Exercise)  12    Perceived Dyspnea (Exercise)  2    Duration  Progress to 45 minutes of aerobic exercise without signs/symptoms of physical distress    Intensity  THRR unchanged      Progression   Progression  Continue to progress workloads to maintain intensity without signs/symptoms of physical distress.      Resistance Training   Training Prescription   Yes    Weight  orange bands    Reps  10-15    Time  10 Minutes      Oxygen   Oxygen  Continuous    Liters  2      NuStep   Level  2    SPM  80    Minutes  34    METs  1.9       Social History   Tobacco Use  Smoking Status Former Smoker  . Packs/day: 1.00  . Years: 40.00  . Pack years: 40.00  . Types: Cigarettes  . Last attempt to quit: 03/20/2003  . Years since quitting: 15.2  Smokeless Tobacco Never Used    Goals Met:  Exercise tolerated well No report of cardiac concerns or symptoms Strength training completed today  Goals Unmet:  Not Applicable  Comments: Service time is from 1330 to 1500    Dr. Rush Farmer is Medical Director for Pulmonary Rehab at [facility].

## 2018-06-18 NOTE — Progress Notes (Signed)
Pulmonary Individual Treatment Plan  Patient Details  Name: [name] MRN: [id number] Date of Birth: 06/02/1935 Referring Provider:     Pulmonary Rehab Walk Test from 04/10/2018 in [place]  Referring Provider  Dr. Elsworth Soho      Initial Encounter Date:    Pulmonary Rehab Walk Test from 04/10/2018 in [facility]  Date  04/11/18      Visit Diagnosis: Chronic respiratory failure with hypoxia ([place])  Patient's Home Medications on Admission:   Current Outpatient Medications:  .  albuterol (PROVENTIL HFA;VENTOLIN HFA) 108 (90 Base) MCG/ACT inhaler, Inhale 2 puffs into the lungs every 6 (six) hours as needed for wheezing or shortness of breath., Disp: 1 Inhaler, Rfl: 2 .  arformoterol (BROVANA) 15 MCG/2ML NEBU, Take 2 mLs (15 mcg total) by nebulization 2 (two) times daily., Disp: 120 mL, Rfl: 5 .  atorvastatin (LIPITOR) 10 MG tablet, TAKE ONE TABLET DAILY AT 6PM, Disp: 60 tablet, Rfl: 1 .  budesonide (PULMICORT) 0.5 MG/2ML nebulizer solution, Take 2 mLs (0.5 mg total) by nebulization 2 (two) times daily., Disp: 120 mL, Rfl: 5 .  DIGOX 125 MCG tablet, TAKE ONE TABLET DAILY, Disp: 90 tablet, Rfl: 0 .  diltiazem (CARDIZEM CD) 240 MG 24 hr capsule, TAKE ONE CAPSULE EACH DAY, Disp: 90 capsule, Rfl: 0 .  latanoprost (XALATAN) 0.005 % ophthalmic solution, Place 1 drop into both eyes at bedtime. Reported on 11/09/2015, Disp: , Rfl:  .  pantoprazole (PROTONIX) 40 MG tablet, TAKE ONE TABLET DAILY, Disp: 90 tablet, Rfl: 1 .  SIMBRINZA 1-0.2 % SUSP, Place 1 drop into both eyes 2 (two) times daily. , Disp: , Rfl: 74  Past Medical History: Past Medical History:  Diagnosis Date  . Arthritis   . COPD (chronic obstructive pulmonary disease) ([place])   . Dyspnea on exertion   . GERD (gastroesophageal reflux disease)   . Glaucoma of both eyes   . History of atrial fibrillation without current medication 2010   POST SURG 2010--  PER PT NO  ISSUES SINCE  . History of breast cancer    DX DCIS IN 2004--  S/P RIGHT MASTECTOMY , NO CHEMORADIATION---  NO RECURRENCE  . History of CHF (congestive heart failure)    POST SURG 2010  . History of hypertension   . History of shingles    02/ 2015--  back of neck and left flank--  no residual pain  . ICH (intracerebral hemorrhage) ([place]) 02/05/2015  . Nephrolithiasis   . Recurrent bladder papillary carcinoma ([place]) first dx 06/ 2014   s/p  turbt's  and instillation mitomycin c (chemo)//dx again in 2015-different type    Tobacco Use: Social History   Tobacco Use  Smoking Status Former Smoker  . Packs/day: 1.00  . Years: 40.00  . Pack years: 40.00  . Types: Cigarettes  . Last attempt to quit: 03/20/2003  . Years since quitting: 15.2  Smokeless Tobacco Never Used    Labs: Recent Chemical engineer    Labs for ITP Cardiac and Pulmonary Rehab Latest Ref Rng & Units 07/11/2015 11/09/2015 05/09/2016 04/19/2017 12/27/2017   Cholestrol 100 - 199 mg/dL 125 - 131 121 -   LDLCALC 0 - 99 mg/dL 59 - 48 52 -   HDL >39 mg/dL 48 - 46.80 46 -   Trlycerides 0 - 149 mg/dL 92 - 184.0(H) 114 -   Hemoglobin A1c 4.8 - 5.6 % - 6.1 6.0 - 5.9(H)   TCO2 0 -  100 mmol/L - - - - -      Capillary Blood Glucose: Lab Results  Component Value Date   GLUCAP 85 12/28/2017   GLUCAP 83 12/28/2017   GLUCAP 129 (H) 12/27/2017   GLUCAP 169 (H) 12/27/2017   GLUCAP 145 (H) 02/05/2015     Pulmonary Assessment Scores: Pulmonary Assessment Scores    Row Name 04/11/18 1003         ADL UCSD   ADL Phase  Entry       mMRC Score   mMRC Score  2        Pulmonary Function Assessment:   Exercise Target Goals: Exercise Program Goal: Individual exercise prescription set using results from initial 6 min walk test and THRR while considering  patient's activity barriers and safety.   Exercise Prescription Goal: Initial exercise prescription builds to 30-45 minutes a day of aerobic activity, 2-3 days per week.   Home exercise guidelines will be given to patient during program as part of exercise prescription that the participant will acknowledge.  Activity Barriers & Risk Stratification: Activity Barriers & Cardiac Risk Stratification - 04/09/18 1615      Activity Barriers & Cardiac Risk Stratification   Cardiac Risk Stratification  Low       6 Minute Walk: 6 Minute Walk    Row Name 04/11/18 0958         6 Minute Walk   Phase  Initial     Distance  700 feet     Walk Time  6 minutes     # of Rest Breaks  - 1 standing rest break for 30 seconds     MPH  1.33     METS  2     RPE  12     Perceived Dyspnea   2     Comments  used wheelchair/ used forehead probe/blurry vision during 91mt which resided/lost balance around corners due to stroke     Resting HR  68 bpm     Resting BP  104/70     Resting Oxygen Saturation   92 %     Exercise Oxygen Saturation  during 6 min walk  88 %     Max Ex. HR  89 bpm     Max Ex. BP  130/64       Interval HR   1 Minute HR  70     2 Minute HR  78     3 Minute HR  73     4 Minute HR  89     5 Minute HR  82     6 Minute HR  67     Interval Heart Rate?  Yes       Interval Oxygen   Interval Oxygen?  Yes     Baseline Oxygen Saturation %  92 %     1 Minute Oxygen Saturation %  88 %     1 Minute Liters of Oxygen  2 L     2 Minute Oxygen Saturation %  94 %     2 Minute Liters of Oxygen  3 L     3 Minute Oxygen Saturation %  92 %     3 Minute Liters of Oxygen  3 L     4 Minute Oxygen Saturation %  93 %     4 Minute Liters of Oxygen  3 L     5 Minute Oxygen Saturation %  96 %  5 Minute Liters of Oxygen  3 L     6 Minute Oxygen Saturation %  88 %     6 Minute Liters of Oxygen  3 L        Oxygen Initial Assessment: Oxygen Initial Assessment - 04/11/18 0957      Initial 6 min Walk   Oxygen Used  Continuous;E-Tanks    Liters per minute  3      Program Oxygen Prescription   Program Oxygen Prescription  Continuous;E-Tanks    Liters per minute   3    Comments  Needs to use forehead probe since fingers are cold and nails are always painted       Oxygen Re-Evaluation: Oxygen Re-Evaluation    Row Name 04/22/18 0736 05/20/18 0740 06/17/18 1641         Program Oxygen Prescription   Program Oxygen Prescription  Continuous;E-Tanks  Continuous;E-Tanks  Continuous;E-Tanks     Liters per minute  _0 Comments  Needs to use forehead probe since fingers are cold and nails are always painted  Needs to use forehead probe since fingers are cold and nails are always painted  Needs to use forehead probe since fingers are cold and nails are always painted       Home Oxygen   Home Oxygen Device  Home Concentrator;Portable Concentrator  Home Concentrator;Portable Concentrator  Home Concentrator;Portable Concentrator     Sleep Oxygen Prescription  Continuous  Continuous  Continuous     Liters per minute  _1 Home Exercise Oxygen Prescription  Continuous  Continuous  Continuous     Liters per minute  _2 Home at Rest Exercise Oxygen Prescription  Continuous  Continuous  Continuous     Liters per minute  _3 Compliance with Home Oxygen Use  No  No  Yes       Goals/Expected Outcomes   Short Term Goals  To learn and exhibit compliance with exercise, home and travel O2 prescription;To learn and understand importance of monitoring SPO2 with pulse oximeter and demonstrate accurate use of the pulse oximeter.;To learn and understand importance of maintaining oxygen saturations>88%;To learn and demonstrate proper pursed lip breathing techniques or other breathing techniques.;To learn and demonstrate proper use of respiratory medications  To learn and exhibit compliance with exercise, home and travel O2 prescription;To learn and understand importance of monitoring SPO2 with pulse oximeter and demonstrate accurate use of the pulse oximeter.;To learn and understand importance of maintaining oxygen saturations>88%;To learn and  demonstrate proper pursed lip breathing techniques or other breathing techniques.;To learn and demonstrate proper use of respiratory medications  To learn and exhibit compliance with exercise, home and travel O2 prescription;To learn and understand importance of monitoring SPO2 with pulse oximeter and demonstrate accurate use of the pulse oximeter.;To learn and understand importance of maintaining oxygen saturations>88%;To learn and demonstrate proper pursed lip breathing techniques or other breathing techniques.;To learn and demonstrate proper use of respiratory medications     Long  Term Goals  Exhibits compliance with exercise, home and travel O2 prescription;Verbalizes importance of monitoring SPO2 with pulse oximeter and return demonstration;Maintenance of O2 saturations>88%;Exhibits proper breathing techniques, such as pursed lip breathing or other method taught during program session;Compliance with respiratory medication;Demonstrates proper use of MDI's  Exhibits compliance with exercise, home and travel O2 prescription;Verbalizes importance of monitoring SPO2  with pulse oximeter and return demonstration;Maintenance of O2 saturations>88%;Exhibits proper breathing techniques, such as pursed lip breathing or other method taught during program session;Compliance with respiratory medication;Demonstrates proper use of MDI's  Exhibits compliance with exercise, home and travel O2 prescription;Verbalizes importance of monitoring SPO2 with pulse oximeter and return demonstration;Maintenance of O2 saturations>88%;Exhibits proper breathing techniques, such as pursed lip breathing or other method taught during program session;Compliance with respiratory medication;Demonstrates proper use of MDI's     Goals/Expected Outcomes  compliance and understanding  compliance and understanding  compliance and understanding        Oxygen Discharge (Final Oxygen Re-Evaluation): Oxygen Re-Evaluation - 06/17/18 1641       Program Oxygen Prescription   Program Oxygen Prescription  Continuous;E-Tanks    Liters per minute  2    Comments  Needs to use forehead probe since fingers are cold and nails are always painted      Home Oxygen   Home Oxygen Device  Home Concentrator;Portable Concentrator    Sleep Oxygen Prescription  Continuous    Liters per minute  2    Home Exercise Oxygen Prescription  Continuous    Liters per minute  2    Home at Rest Exercise Oxygen Prescription  Continuous    Liters per minute  2    Compliance with Home Oxygen Use  Yes      Goals/Expected Outcomes   Short Term Goals  To learn and exhibit compliance with exercise, home and travel O2 prescription;To learn and understand importance of monitoring SPO2 with pulse oximeter and demonstrate accurate use of the pulse oximeter.;To learn and understand importance of maintaining oxygen saturations>88%;To learn and demonstrate proper pursed lip breathing techniques or other breathing techniques.;To learn and demonstrate proper use of respiratory medications    Long  Term Goals  Exhibits compliance with exercise, home and travel O2 prescription;Verbalizes importance of monitoring SPO2 with pulse oximeter and return demonstration;Maintenance of O2 saturations>88%;Exhibits proper breathing techniques, such as pursed lip breathing or other method taught during program session;Compliance with respiratory medication;Demonstrates proper use of MDI's    Goals/Expected Outcomes  compliance and understanding       Initial Exercise Prescription: Initial Exercise Prescription - 04/11/18 1000      Date of Initial Exercise RX and Referring Provider   Date  04/11/18    Referring Provider  Dr. Elsworth Soho      Oxygen   Oxygen  Continuous    Liters  3      Recumbant Bike   Level  1    RPM  10    Minutes  17      NuStep   Level  1    SPM  80    Minutes  17    METs  1.3      Prescription Details   Frequency (times per week)  2    Duration  Progress  to 45 minutes of aerobic exercise without signs/symptoms of physical distress      Intensity   THRR 40-80% of Max Heartrate  56-112    Ratings of Perceived Exertion  11-13    Perceived Dyspnea  0-4      Progression   Progression  Continue progressive overload as per policy without signs/symptoms or physical distress.      Resistance Training   Training Prescription  Yes    Weight  orange bands    Reps  10-15       Perform Capillary Blood Glucose checks as needed.  Exercise Prescription Changes: Exercise Prescription Changes    Row Name 04/22/18 1600 05/06/18 1500 05/20/18 1600 06/03/18 1600 06/17/18 1600     Response to Exercise   Blood Pressure (Admit)  102/70  104/64  110/60  95/58  104/64   Blood Pressure (Exercise)  132/76  110/70  140/80  110/68  100/64   Blood Pressure (Exit)  102/70  120/60  106/60  113/69  112/78   Heart Rate (Admit)  67 bpm  68 bpm  66 bpm  67 bpm  76 bpm   Heart Rate (Exercise)  88 bpm  94 bpm  96 bpm  81 bpm  83 bpm   Heart Rate (Exit)  67 bpm  76 bpm  64 bpm  87 bpm  85 bpm   Oxygen Saturation (Admit)  95 %  98 %  94 %  97 %  95 %   Oxygen Saturation (Exercise)  92 %  92 %  97 %  88 %  92 %   Oxygen Saturation (Exit)  96 %  97 %  97 %  98 %  96 %   Rating of Perceived Exertion (Exercise)  _0 Perceived Dyspnea (Exercise)  _1 Duration  Progress to 45 minutes of aerobic exercise without signs/symptoms of physical distress  Progress to 45 minutes of aerobic exercise without signs/symptoms of physical distress  Progress to 45 minutes of aerobic exercise without signs/symptoms of physical distress  Progress to 45 minutes of aerobic exercise without signs/symptoms of physical distress  Progress to 45 minutes of aerobic exercise without signs/symptoms of physical distress   Intensity  Other (comment) 40-80% of HRR  THRR unchanged  THRR unchanged  THRR unchanged  THRR unchanged     Progression   Progression  Continue to  progress workloads to maintain intensity without signs/symptoms of physical distress.  Continue to progress workloads to maintain intensity without signs/symptoms of physical distress.  Continue to progress workloads to maintain intensity without signs/symptoms of physical distress.  Continue to progress workloads to maintain intensity without signs/symptoms of physical distress.  Continue to progress workloads to maintain intensity without signs/symptoms of physical distress.     Resistance Training   Training Prescription  Yes  Yes  Yes  Yes  Yes   Weight  orange bands  orange bands  orange bands  orange bands  orange bands   Reps  10-15  10-15  10-15  10-15  10-15   Time  10 Minutes  10 Minutes  10 Minutes  10 Minutes  10 Minutes     Oxygen   Oxygen  Continuous  Continuous  Continuous  Continuous  Continuous   Liters  _2 NuStep   Level  _3 SPM  80  80  80  80  80   Minutes  34  34  34  34  34   METs  -  1.8  2  1.9  1.9     Track   Laps  _4 -   Minutes  _5 -      Exercise Comments:   Exercise Goals and Review: Exercise Goals    Row Name 04/09/18 1615  Exercise Goals   Increase Physical Activity  Yes       Intervention  Provide advice, education, support and counseling about physical activity/exercise needs.;Develop an individualized exercise prescription for aerobic and resistive training based on initial evaluation findings, risk stratification, comorbidities and participant's personal goals.       Expected Outcomes  Short Term: Attend rehab on a regular basis to increase amount of physical activity.;Long Term: Add in home exercise to make exercise part of routine and to increase amount of physical activity.;Long Term: Exercising regularly at least 3-5 days a week.       Increase Strength and Stamina  Yes       Intervention  Provide advice, education, support and counseling about physical activity/exercise  needs.;Develop an individualized exercise prescription for aerobic and resistive training based on initial evaluation findings, risk stratification, comorbidities and participant's personal goals.       Expected Outcomes  Short Term: Increase workloads from initial exercise prescription for resistance, speed, and METs.;Short Term: Perform resistance training exercises routinely during rehab and add in resistance training at home;Long Term: Improve cardiorespiratory fitness, muscular endurance and strength as measured by increased METs and functional capacity (6MWT)       Able to understand and use rate of perceived exertion (RPE) scale  Yes       Intervention  Provide education and explanation on how to use RPE scale       Expected Outcomes  Short Term: Able to use RPE daily in rehab to express subjective intensity level;Long Term:  Able to use RPE to guide intensity level when exercising independently       Able to understand and use Dyspnea scale  Yes       Intervention  Provide education and explanation on how to use Dyspnea scale       Expected Outcomes  Short Term: Able to use Dyspnea scale daily in rehab to express subjective sense of shortness of breath during exertion;Long Term: Able to use Dyspnea scale to guide intensity level when exercising independently       Knowledge and understanding of Target Heart Rate Range (THRR)  Yes       Intervention  Provide education and explanation of THRR including how the numbers were predicted and where they are located for reference       Expected Outcomes  Short Term: Able to state/look up THRR;Short Term: Able to use daily as guideline for intensity in rehab;Long Term: Able to use THRR to govern intensity when exercising independently       Understanding of Exercise Prescription  Yes       Intervention  Provide education, explanation, and written materials on patient's individual exercise prescription       Expected Outcomes  Short Term: Able to explain  program exercise prescription;Long Term: Able to explain home exercise prescription to exercise independently          Exercise Goals Re-Evaluation : Exercise Goals Re-Evaluation    Row Name 04/22/18 0736 05/20/18 0741 06/17/18 1645         Exercise Goal Re-Evaluation   Exercise Goals Review  Increase Physical Activity;Increase Strength and Stamina;Able to understand and use rate of perceived exertion (RPE) scale;Able to understand and use Dyspnea scale;Knowledge and understanding of Target Heart Rate Range (THRR);Understanding of Exercise Prescription  Increase Physical Activity;Increase Strength and Stamina;Able to understand and use rate of perceived exertion (RPE) scale;Able to understand and use Dyspnea scale;Knowledge and understanding of Target Heart Rate Range (  THRR);Understanding of Exercise Prescription  Increase Physical Activity;Increase Strength and Stamina;Able to understand and use rate of perceived exertion (RPE) scale;Able to understand and use Dyspnea scale;Knowledge and understanding of Target Heart Rate Range (THRR);Understanding of Exercise Prescription     Comments  Patient's first day of exercise should be today.   Patient's progress will be slow due to extreme shortness of breath and deconditioning. Patient's average has increased from 1.5 to 1.9. Patient is able to walk 11 laps (200 ft each) in 15 minutes. Will cont to monitor and motivate.  Patient's progress will be slow due to extreme shortness of breath and deconditioning. Patient's average has increased from 1.5 to 1.9. Patient is able to walk 11 laps (200 ft each) in 15 minutes. Patient is interested in Pulmonary Rehab Maintenance when she graduates. I discussed home exercise with patient and highly encouraged. Will cont to monitor and motivate.     Expected Outcomes  Through rehab and exercising at home, patient will find it easier to carry out ADL's and will gain the confidence to establish an exercise routine at home.    Through rehab and exercising at home, patient will find it easier to carry out ADL's and will gain the confidence to establish an exercise routine at home.   Through rehab and exercising at home, patient will find it easier to carry out ADL's and will gain the confidence to establish an exercise routine at home.         Discharge Exercise Prescription (Final Exercise Prescription Changes): Exercise Prescription Changes - 06/17/18 1600      Response to Exercise   Blood Pressure (Admit)  104/64    Blood Pressure (Exercise)  100/64    Blood Pressure (Exit)  112/78    Heart Rate (Admit)  76 bpm    Heart Rate (Exercise)  83 bpm    Heart Rate (Exit)  85 bpm    Oxygen Saturation (Admit)  95 %    Oxygen Saturation (Exercise)  92 %    Oxygen Saturation (Exit)  96 %    Rating of Perceived Exertion (Exercise)  12    Perceived Dyspnea (Exercise)  2    Duration  Progress to 45 minutes of aerobic exercise without signs/symptoms of physical distress    Intensity  THRR unchanged      Progression   Progression  Continue to progress workloads to maintain intensity without signs/symptoms of physical distress.      Resistance Training   Training Prescription  Yes    Weight  orange bands    Reps  10-15    Time  10 Minutes      Oxygen   Oxygen  Continuous    Liters  2      NuStep   Level  2    SPM  80    Minutes  34    METs  1.9       Nutrition:  Target Goals: Understanding of nutrition guidelines, daily intake of sodium <1539m, cholesterol <2025m calories 30% from fat and 7% or less from saturated fats, daily to have 5 or more servings of fruits and vegetables.  Biometrics:    Nutrition Therapy Plan and Nutrition Goals: Nutrition Therapy & Goals - 04/10/18 1403      Nutrition Therapy   Diet  consistent carbohydrate heart healthy      Personal Nutrition Goals   Nutrition Goal  Pt to identify and limit food sources of sodium, saturated fat, refined carbohydrates  Personal Goal  #2  Identify food quantities necessary to achieve wt loss of  -1# per week to a goal wt loss of 5-7 lb at graduation from pulmonary rehab.    Personal Goal #3  Pt able to name foods that affect blood glucose       Intervention Plan   Intervention  Prescribe, educate and counsel regarding individualized specific dietary modifications aiming towards targeted core components such as weight, hypertension, lipid management, diabetes, heart failure and other comorbidities.    Expected Outcomes  Short Term Goal: Understand basic principles of dietary content, such as calories, fat, sodium, cholesterol and nutrients.       Nutrition Assessments: Nutrition Assessments - 04/10/18 1404      Rate Your Plate Scores   Pre Score  43       Nutrition Goals Re-Evaluation:   Nutrition Goals Discharge (Final Nutrition Goals Re-Evaluation):   Psychosocial: Target Goals: Acknowledge presence or absence of significant depression and/or stress, maximize coping skills, provide positive support system. Participant is able to verbalize types and ability to use techniques and skills needed for reducing stress and depression.  Initial Review & Psychosocial Screening: Initial Psych Review & Screening - 04/09/18 1611      Family Dynamics   Comments  pt with no identifiable barriers.  Pt denes any stress or anxiety       Quality of Life Scores:  Scores of 19 and below usually indicate a poorer quality of life in these areas.  A difference of  2-3 points is a clinically meaningful difference.  A difference of 2-3 points in the total score of the Quality of Life Index has been associated with significant improvement in overall quality of life, self-image, physical symptoms, and general health in studies assessing change in quality of life.  PHQ-9: Recent Review Flowsheet Data    Depression screen [facility] 2/9 04/09/2018 12/27/2017 11/12/2015 05/20/2015 04/22/2015   Decreased Interest 0 0 0 0 0   Down, Depressed,  Hopeless 0 0 0 0 0   PHQ - 2 Score 0 0 0 0 0   Altered sleeping 0 - - - 1   Tired, decreased energy 1 - - - 0   Change in appetite 0 - - - 0   Feeling bad or failure about yourself  0 - - - 0   Trouble concentrating 0 - - - 0   Moving slowly or fidgety/restless 0 - - - 0   Suicidal thoughts 0 - - - 0   PHQ-9 Score 1 - - - 1   Difficult doing work/chores Not difficult at all - - - -     Interpretation of Total Score  Total Score Depression Severity:  1-4 = Minimal depression, 5-9 = Mild depression, 10-14 = Moderate depression, 15-19 = Moderately severe depression, 20-27 = Severe depression   Psychosocial Evaluation and Intervention: Psychosocial Evaluation - 06/18/18 1939      Psychosocial Evaluation & Interventions   Interventions  Stress management education;Relaxation education;Encouraged to exercise with the program and follow exercise prescription    Comments  Pt has positive and healthy coping skills with supportive family. Daughter will sometime accompany her to exercise.     Expected Outcomes  Pt will continue to demonstrate positive coping skills and healthy outlook.    Continue Psychosocial Services   Follow up required by staff       Psychosocial Re-Evaluation: Psychosocial Re-Evaluation    [place] Name 05/22/18 1321 06/18/18 1939  Psychosocial Re-Evaluation   Current issues with  None Identified  None Identified      Comments  pt reports no barriers to participating in pulmonary rehab.  Pt appears to enjoy coming to [facility] and is beginning to feel better  pt reports no barriers to participating in pulmonary rehab.  Pt appears to enjoy coming to [facility] and is beginning to feel better      Expected Outcomes  Pt will continue mental well being and adopt exercise as a part of her routine  Pt will continue mental well being and adopt exercise as a part of her routine      Interventions  Stress management education;Encouraged to attend Pulmonary Rehabilitation for  the exercise;Relaxation education  Stress management education;Encouraged to attend Pulmonary Rehabilitation for the exercise;Relaxation education      Continue Psychosocial Services   Follow up required by staff  Follow up required by staff         Psychosocial Discharge (Final Psychosocial Re-Evaluation): Psychosocial Re-Evaluation - 06/18/18 1939      Psychosocial Re-Evaluation   Current issues with  None Identified    Comments  pt reports no barriers to participating in pulmonary rehab.  Pt appears to enjoy coming to [facility] and is beginning to feel better    Expected Outcomes  Pt will continue mental well being and adopt exercise as a part of her routine    Interventions  Stress management education;Encouraged to attend Pulmonary Rehabilitation for the exercise;Relaxation education    Continue Psychosocial Services   Follow up required by staff       Education: Education Goals: Education classes will be provided on a weekly basis, covering required topics. Participant will state understanding/return demonstration of topics presented.  Learning Barriers/Preferences: Learning Barriers/Preferences - 04/09/18 1440      Learning Barriers/Preferences   Learning Barriers  Sight    Learning Preferences  Individual Instruction;Group Instruction;Verbal Instruction;Written Material       Education Topics: Risk Factor Reduction:  -Group instruction that is supported by a PowerPoint presentation. Instructor discusses the definition of a risk factor, different risk factors for pulmonary disease, and how the heart and lungs work together.     Nutrition for Pulmonary Patient:  -Group instruction provided by PowerPoint slides, verbal discussion, and written materials to support subject matter. The instructor gives an explanation and review of healthy diet recommendations, which includes a discussion on weight management, recommendations for fruit and vegetable consumption, as well as protein,  fluid, caffeine, fiber, sodium, sugar, and alcohol. Tips for eating when patients are short of breath are discussed.   PULMONARY REHAB OTHER RESPIRATORY from 06/05/2018 in [place]  Date  05/01/18  Educator  Rodman Pickle  Instruction Review Code  2- Demonstrated Understanding      Pursed Lip Breathing:  -Group instruction that is supported by demonstration and informational handouts. Instructor discusses the benefits of pursed lip and diaphragmatic breathing and detailed demonstration on how to preform both.     Oxygen Safety:  -Group instruction provided by PowerPoint, verbal discussion, and written material to support subject matter. There is an overview of "What is Oxygen" and "Why do we need it".  Instructor also reviews how to create a safe environment for oxygen use, the importance of using oxygen as prescribed, and the risks of noncompliance. There is a brief discussion on traveling with oxygen and resources the patient may utilize.   PULMONARY REHAB OTHER RESPIRATORY from 06/05/2018 in [facility]  CARDIAC REHAB  Date  06/05/18  Educator  EP  Instruction Review Code  1- Verbalizes Understanding      Oxygen Equipment:  -Group instruction provided by [employer] utilizing handouts, written materials, and Insurance underwriter.   Signs and Symptoms:  -Group instruction provided by written material and verbal discussion to support subject matter. Warning signs and symptoms of infection, stroke, and heart attack are reviewed and when to call the physician/911 reinforced. Tips for preventing the spread of infection discussed.   PULMONARY REHAB OTHER RESPIRATORY from 06/05/2018 in [place]  Date  05/29/18  Educator  rn  Instruction Review Code  1- Verbalizes Understanding      Advanced Directives:  -Group instruction provided by verbal instruction and written material to support subject matter.  Instructor reviews Advanced Directive laws and proper instruction for filling out document.   Pulmonary Video:  -Group video education that reviews the importance of medication and oxygen compliance, exercise, good nutrition, pulmonary hygiene, and pursed lip and diaphragmatic breathing for the pulmonary patient.   Exercise for the Pulmonary Patient:  -Group instruction that is supported by a PowerPoint presentation. Instructor discusses benefits of exercise, core components of exercise, frequency, duration, and intensity of an exercise routine, importance of utilizing pulse oximetry during exercise, safety while exercising, and options of places to exercise outside of rehab.     Pulmonary Medications:  -Verbally interactive group education provided by instructor with focus on inhaled medications and proper administration.   PULMONARY REHAB OTHER RESPIRATORY from 06/05/2018 in [place]  Date  05/13/18  Educator  pharm  Instruction Review Code  2- Demonstrated Understanding      Anatomy and Physiology of the Respiratory System and Intimacy:  -Group instruction provided by PowerPoint, verbal discussion, and written material to support subject matter. Instructor reviews respiratory cycle and anatomical components of the respiratory system and their functions. Instructor also reviews differences in obstructive and restrictive respiratory diseases with examples of each. Intimacy, Sex, and Sexuality differences are reviewed with a discussion on how relationships can change when diagnosed with pulmonary disease. Common sexual concerns are reviewed.   MD DAY -A group question and answer session with a medical doctor that allows participants to ask questions that relate to their pulmonary disease state.   OTHER EDUCATION -Group or individual verbal, written, or video instructions that support the educational goals of the pulmonary rehab program.   PULMONARY REHAB  OTHER RESPIRATORY from 06/05/2018 in [place]  Date  05/22/18  Educator  Cloyde Reams  Instruction Review Code  1- Verbalizes Understanding [Sedentary Lifestyle]      Holiday Eating Survival Tips:  -Group instruction provided by [employer], verbal discussion, and written materials to support subject matter. The instructor gives patients tips, tricks, and techniques to help them not only survive but enjoy the holidays despite the onslaught of food that accompanies the holidays.   Knowledge Questionnaire Score:   Core Components/Risk Factors/Patient Goals at Admission: Personal Goals and Risk Factors at Admission - 04/09/18 1436      Core Components/Risk Factors/Patient Goals on Admission    Weight Management  Yes;Weight Loss    Intervention  Weight Management: Develop a combined nutrition and exercise program designed to reach desired caloric intake, while maintaining appropriate intake of nutrient and fiber, sodium and fats, and appropriate energy expenditure required for the weight goal.;Weight Management: Provide education and appropriate resources to help participant work on and  attain dietary goals.;Weight Management/Obesity: Establish reasonable short term and long term weight goals.    Admit Weight  157 lb 13.6 oz (71.6 kg)    Goal Weight: Short Term  153 lb (69.4 kg)    Goal Weight: Long Term  150 lb (68 kg)    Expected Outcomes  Short Term: Continue to assess and modify interventions until short term weight is achieved;Long Term: Adherence to nutrition and physical activity/exercise program aimed toward attainment of established weight goal;Weight Loss: Understanding of general recommendations for a balanced deficit meal plan, which promotes 1-2 lb weight loss per week and includes a negative energy balance of [id number] kcal/d;Understanding recommendations for meals to include 15-35% energy as protein, 25-35% energy from fat, 35-60% energy from  carbohydrates, less than 237m of dietary cholesterol, 20-35 gm of total fiber daily;Understanding of distribution of calorie intake throughout the day with the consumption of 4-5 meals/snacks    Improve shortness of breath with ADL's  Yes    Intervention  Provide education, individualized exercise plan and daily activity instruction to help decrease symptoms of SOB with activities of daily living.    Expected Outcomes  Short Term: Improve cardiorespiratory fitness to achieve a reduction of symptoms when performing ADLs;Long Term: Be able to perform more ADLs without symptoms or delay the onset of symptoms       Core Components/Risk Factors/Patient Goals Review:  Goals and Risk Factor Review    Row Name 04/23/18 1202 05/22/18 1322 06/18/18 1940         Core Components/Risk Factors/Patient Goals Review   Personal Goals Review  Weight Management/Obesity;Improve shortness of breath with ADL's;Increase knowledge of respiratory medications and ability to use respiratory devices properly.;Develop more efficient breathing techniques such as purse lipped breathing and diaphragmatic breathing and practicing self-pacing with activity.  Weight Management/Obesity;Improve shortness of breath with ADL's;Increase knowledge of respiratory medications and ability to use respiratory devices properly.;Develop more efficient breathing techniques such as purse lipped breathing and diaphragmatic breathing and practicing self-pacing with activity.  Weight Management/Obesity;Improve shortness of breath with ADL's;Increase knowledge of respiratory medications and ability to use respiratory devices properly.;Develop more efficient breathing techniques such as purse lipped breathing and diaphragmatic breathing and practicing self-pacing with activity.     Review  Pt has completed 1 exercise session on 8/20.  Unable to adequately assess at this time pt progress toward her patient goals  Pt has completed 7 exercise sessions, Pt  has attended the respiratory medication class. Pt with weight loss of .7 kg since 8/22.  Pt with increase to level 2 on the nustep and averages 10 laps in 15 minutes.  Pt rates her dyspnea level 2.  I anticipate with consistent attendance, pt will show measurable progress in the next 30 day assessment.  Pt has completed 13 exercise sessions and 5 education classes.  Pt had brief absence for minor bladder procedure.  Pt has history of bladder CA,  Pt with weight loss of .8 kg since 8/22.  Pt maintained level 2 on the nustep and averages 10 laps in 15 minutes.  Pt rates her dyspnea level 2.  I anticipate with consistent attendance, pt will show measurable progress in the next 30 day assessment.     Expected Outcomes  See Admission Outcomes/Goals  See Admission Outcomes/Goals  See Admission Outcomes/Goals        Core Components/Risk Factors/Patient Goals at Discharge (Final Review):  Goals and Risk Factor Review - 06/18/18 1940      Core Components/Risk Factors/Patient  Goals Review   Personal Goals Review  Weight Management/Obesity;Improve shortness of breath with ADL's;Increase knowledge of respiratory medications and ability to use respiratory devices properly.;Develop more efficient breathing techniques such as purse lipped breathing and diaphragmatic breathing and practicing self-pacing with activity.    Review  Pt has completed 13 exercise sessions and 5 education classes.  Pt had brief absence for minor bladder procedure.  Pt has history of bladder CA,  Pt with weight loss of .8 kg since 8/22.  Pt maintained level 2 on the nustep and averages 10 laps in 15 minutes.  Pt rates her dyspnea level 2.  I anticipate with consistent attendance, pt will show measurable progress in the next 30 day assessment.    Expected Outcomes  See Admission Outcomes/Goals       ITP Comments: ITP Comments    Row Name 04/09/18 1431 04/23/18 1201 05/22/18 1320 06/18/18 1939     ITP Comments  Dr. Jennet Maduro, Medical  Director  Dr. Jennet Maduro, Medical Director  Dr. Jennet Maduro, Medical Director Pulmonary Rehab  Dr. Jennet Maduro, Medical Director Pulmonary Rehab       Comments: Pt completed 13 exercise sessions. Continue to monitor. Cherre Huger, BSN Cardiac and Training and development officer

## 2018-06-19 ENCOUNTER — Encounter (HOSPITAL_COMMUNITY): Payer: Medicare Other

## 2018-06-19 DIAGNOSIS — H2000 Unspecified acute and subacute iridocyclitis: Secondary | ICD-10-CM | POA: Diagnosis not present

## 2018-06-19 NOTE — Progress Notes (Signed)
I have reviewed a Home Exercise Prescription with [name] . [name] is not currently exercising at home.  The patient was advised to walk 1-2 days a week for 30 minutes.  [name] and I discussed how to progress their exercise prescription.  The patient stated that their goals were to live as long as possible, decrease shortness of breath with activity.  The patient stated that they understand the exercise prescription.  We reviewed exercise guidelines, target heart rate during exercise, RPE Scale, weather conditions, NTG use, endpoints for exercise, warmup and cool down.  Patient is encouraged to come to me with any questions. I will continue to follow up with the patient to assist them with progression and safety.

## 2018-06-20 DIAGNOSIS — H2000 Unspecified acute and subacute iridocyclitis: Secondary | ICD-10-CM | POA: Diagnosis not present

## 2018-06-23 DIAGNOSIS — H2000 Unspecified acute and subacute iridocyclitis: Secondary | ICD-10-CM | POA: Diagnosis not present

## 2018-06-24 ENCOUNTER — Other Ambulatory Visit: Payer: Self-pay | Admitting: Cardiology

## 2018-06-24 ENCOUNTER — Encounter (HOSPITAL_COMMUNITY)
Admission: RE | Admit: 2018-06-24 | Discharge: 2018-06-24 | Disposition: A | Discharge status: Home / Self Care | Payer: Medicare Other | Source: Ambulatory Visit | Attending: Pulmonary Disease | Admitting: Pulmonary Disease

## 2018-06-24 DIAGNOSIS — J9611 Chronic respiratory failure with hypoxia: Secondary | ICD-10-CM | POA: Diagnosis not present

## 2018-06-24 NOTE — Progress Notes (Signed)
Daily Session Note  Patient Details  Name: [name] MRN: [id number] Date of Birth: 1935-01-30 Referring Provider:     Pulmonary Rehab Walk Test from 04/10/2018 in [place]  Referring Provider  Dr. Elsworth Soho      Encounter Date: 06/24/2018  Check In: Session Check In - 06/24/18 1331      Check-In   Supervising physician immediately available to respond to emergencies  [facility] immediately available    Physician(s)  Dr. Ree Kida    Location  [facility]    Staff Present  Su Hilt, MS, ACSM RCEP, Exercise Physiologist;Dalton Kris Mouton, MS, Exercise Physiologist;Lisa Ysidro Evert, Felipe Drone, RN, MHA;Carlette Wilber Oliphant, RN, BSN    Medication changes reported      No    Fall or balance concerns reported     No    Tobacco Cessation  No Change    Warm-up and Cool-down  Performed as group-led instruction    Resistance Training Performed  Yes    VAD Patient?  No    PAD/SET Patient?  No      Pain Assessment   Currently in Pain?  No/denies    Multiple Pain Sites  No       Capillary Blood Glucose: No results found for this or any previous visit (from the past 24 hour(s)).    Social History   Tobacco Use  Smoking Status Former Smoker  . Packs/day: 1.00  . Years: 40.00  . Pack years: 40.00  . Types: Cigarettes  . Last attempt to quit: 03/20/2003  . Years since quitting: 15.2  Smokeless Tobacco Never Used    Goals Met:  Proper associated with RPD/PD & O2 Sat Exercise tolerated well  Goals Unmet:  Not Applicable  Comments: Service time is from 1330 to 1500.    Dr. Rush Farmer is Medical Director for Pulmonary Rehab at [facility].

## 2018-06-24 NOTE — Telephone Encounter (Signed)
Rx request sent to pharmacy.  

## 2018-06-26 ENCOUNTER — Encounter (HOSPITAL_COMMUNITY)
Admission: RE | Admit: 2018-06-26 | Discharge: 2018-06-26 | Disposition: A | Discharge status: Home / Self Care | Payer: Medicare Other | Source: Ambulatory Visit | Attending: Pulmonary Disease | Admitting: Pulmonary Disease

## 2018-06-26 DIAGNOSIS — J9611 Chronic respiratory failure with hypoxia: Secondary | ICD-10-CM | POA: Diagnosis not present

## 2018-06-26 NOTE — Progress Notes (Signed)
Daily Session Note  Patient Details  Name: [name] MRN: [id number] Date of Birth: 02-17-1935 Referring Provider:     Pulmonary Rehab Walk Test from 04/10/2018 in [place]  Referring Provider  Dr. Elsworth Soho      Encounter Date: 06/26/2018  Check In: Session Check In - 06/26/18 1330      Check-In   Supervising physician immediately available to respond to emergencies  [facility] immediately available    Physician(s)  Dr. Florene Glen    Location  [facility]    Staff Present  Rosebud Poles, RN, BSN;Carlette Wilber Oliphant, RN, BSN;Molly DiVincenzo, MS, ACSM RCEP, Exercise Physiologist;Dalton Kris Mouton, MS, Exercise Physiologist;Lisa Colletta [place], RN, MHA    Medication changes reported      No    Fall or balance concerns reported     No    Tobacco Cessation  No Change    Warm-up and Cool-down  Performed as group-led instruction    Resistance Training Performed  Yes    VAD Patient?  No    PAD/SET Patient?  No      Pain Assessment   Currently in Pain?  No/denies    Multiple Pain Sites  No       Capillary Blood Glucose: No results found for this or any previous visit (from the past 24 hour(s)).    Social History   Tobacco Use  Smoking Status Former Smoker  . Packs/day: 1.00  . Years: 40.00  . Pack years: 40.00  . Types: Cigarettes  . Last attempt to quit: 03/20/2003  . Years since quitting: 15.2  Smokeless Tobacco Never Used    Goals Met:  Proper associated with RPD/PD & O2 Sat Exercise tolerated well  Goals Unmet:  Not Applicable  Comments: Service time is from 1330 to 1520    Dr. Rush Farmer is Medical Director for Pulmonary Rehab at [facility].

## 2018-07-01 ENCOUNTER — Encounter (HOSPITAL_COMMUNITY)
Admission: RE | Admit: 2018-07-01 | Discharge: 2018-07-01 | Disposition: A | Discharge status: Home / Self Care | Payer: Medicare Other | Source: Ambulatory Visit | Attending: Pulmonary Disease | Admitting: Pulmonary Disease

## 2018-07-01 VITALS — Wt 156.3 lb

## 2018-07-01 DIAGNOSIS — J9611 Chronic respiratory failure with hypoxia: Secondary | ICD-10-CM

## 2018-07-01 DIAGNOSIS — H2000 Unspecified acute and subacute iridocyclitis: Secondary | ICD-10-CM | POA: Diagnosis not present

## 2018-07-01 NOTE — Progress Notes (Signed)
Daily Session Note  Patient Details  Name: [name] MRN: [id number] Date of Birth: [birth year] Referring Provider:     Pulmonary Rehab Walk Test from 04/10/2018 in [place]  Referring Provider  Dr. Elsworth Soho      Encounter Date: 07/01/2018  Check In: Session Check In - 07/01/18 1330      Check-In   Supervising physician immediately available to respond to emergencies  [facility] immediately available    Physician(s)  Dr. Florene Glen    Location  [facility]    Staff Present  Rosebud Poles, RN, BSN;Carlette Wilber Oliphant, RN, BSN;Molly DiVincenzo, MS, ACSM RCEP, Exercise Physiologist;Dalton Kris Mouton, MS, Exercise Physiologist;Lisa Colletta Maryland, RN, MHA    Medication changes reported      No    Fall or balance concerns reported     No    Tobacco Cessation  No Change    Warm-up and Cool-down  Performed as group-led instruction    Resistance Training Performed  Yes    VAD Patient?  No    PAD/SET Patient?  No      Pain Assessment   Currently in Pain?  No/denies    Multiple Pain Sites  No       Capillary Blood Glucose: No results found for this or any previous visit (from the past 24 hour(s)).  Exercise Prescription Changes - 07/01/18 1600      Response to Exercise   Blood Pressure (Admit)  102/52    Blood Pressure (Exercise)  124/62    Blood Pressure (Exit)  102/78    Heart Rate (Admit)  74 bpm    Heart Rate (Exercise)  81 bpm    Heart Rate (Exit)  88 bpm    Oxygen Saturation (Admit)  95 %    Oxygen Saturation (Exercise)  91 %    Oxygen Saturation (Exit)  99 %    Rating of Perceived Exertion (Exercise)  12    Perceived Dyspnea (Exercise)  2    Duration  Progress to 45 minutes of aerobic exercise without signs/symptoms of physical distress    Intensity  THRR unchanged      Progression   Progression  Continue to progress workloads to maintain intensity without signs/symptoms of physical distress.      Resistance Training   Training Prescription  Yes    Weight  orange bands    Reps  10-15    Time  10 Minutes      Oxygen   Oxygen  Continuous    Liters  2      NuStep   Level  2    SPM  80    Minutes  34    METs  1.9      Track   Laps  10    Minutes  17       Social History   Tobacco Use  Smoking Status Former Smoker  . Packs/day: 1.00  . Years: 40.00  . Pack years: 40.00  . Types: Cigarettes  . Last attempt to quit: 03/20/2003  . Years since quitting: 15.2  Smokeless Tobacco Never Used    Goals Met:  Proper associated with RPD/PD & O2 Sat Exercise tolerated well  Goals Unmet:  Not Applicable  Comments: Service time is from 1330 to 1500    Dr. Rush Farmer is Medical Director for Pulmonary Rehab at [facility].

## 2018-07-03 ENCOUNTER — Encounter (HOSPITAL_COMMUNITY)
Admission: RE | Admit: 2018-07-03 | Discharge: 2018-07-03 | Disposition: A | Discharge status: Home / Self Care | Payer: Medicare Other | Source: Ambulatory Visit | Attending: Pulmonary Disease | Admitting: Pulmonary Disease

## 2018-07-03 DIAGNOSIS — J9611 Chronic respiratory failure with hypoxia: Secondary | ICD-10-CM

## 2018-07-03 NOTE — Progress Notes (Signed)
Daily Session Note  Patient Details  Name: [name] MRN: [id number] Date of Birth: 12/10/1934 Referring Provider:     Pulmonary Rehab Walk Test from 04/10/2018 in [place]  Referring Provider  Dr. Elsworth Soho      Encounter Date: 07/03/2018  Check In: Session Check In - 07/03/18 1330      Check-In   Supervising physician immediately available to respond to emergencies  [facility] immediately available    Physician(s)  Dr. [place] Pink    Location  [facility]    Staff Present  Rosebud Poles, RN, BSN;Carlette Wilber Oliphant, RN, BSN;Molly DiVincenzo, MS, ACSM RCEP, Exercise Physiologist;Dalton Kris Mouton, MS, Exercise Physiologist;Lisa Colletta [place], RN, MHA    Medication changes reported      No    Fall or balance concerns reported     No    Tobacco Cessation  No Change    Warm-up and Cool-down  Performed as group-led instruction    Resistance Training Performed  Yes    VAD Patient?  No    PAD/SET Patient?  No      Pain Assessment   Currently in Pain?  No/denies    Multiple Pain Sites  No       Capillary Blood Glucose: No results found for this or any previous visit (from the past 24 hour(s)).    Social History   Tobacco Use  Smoking Status Former Smoker  . Packs/day: 1.00  . Years: 40.00  . Pack years: 40.00  . Types: Cigarettes  . Last attempt to quit: 03/20/2003  . Years since quitting: 15.2  Smokeless Tobacco Never Used    Goals Met:  Proper associated with RPD/PD & O2 Sat Exercise tolerated well  Goals Unmet:  Not Applicable  Comments: Service time is from 1330 to 1520    Dr. Rush Farmer is Medical Director for Pulmonary Rehab at [facility].

## 2018-07-08 ENCOUNTER — Other Ambulatory Visit: Payer: Self-pay | Admitting: Cardiology

## 2018-07-08 ENCOUNTER — Encounter (HOSPITAL_COMMUNITY)
Admission: RE | Admit: 2018-07-08 | Discharge: 2018-07-08 | Disposition: A | Discharge status: Home / Self Care | Payer: Medicare Other | Source: Ambulatory Visit | Attending: Pulmonary Disease | Admitting: Pulmonary Disease

## 2018-07-08 DIAGNOSIS — J9611 Chronic respiratory failure with hypoxia: Secondary | ICD-10-CM | POA: Diagnosis not present

## 2018-07-08 NOTE — Telephone Encounter (Signed)
Rx request sent to pharmacy.

## 2018-07-08 NOTE — Progress Notes (Signed)
Daily Session Note  Patient Details  Name: [name] MRN: [id number] Date of Birth: 1935/04/27 Referring Provider:     Pulmonary Rehab Walk Test from 04/10/2018 in [place]  Referring Provider  Dr. Elsworth Soho      Encounter Date: 07/08/2018  Check In: Session Check In - 07/08/18 1330      Check-In   Supervising physician immediately available to respond to emergencies  [facility] immediately available    Physician(s)  Dr. [place] Pink    Location  [facility]    Staff Present  Rosebud Poles, RN, BSN;Ramon Dredge, RN, MHA;Carlette Wilber Oliphant, RN, Bjorn Loser, MS, Exercise Physiologist;Lisa Ysidro Evert, RN    Medication changes reported      No    Fall or balance concerns reported     No    Tobacco Cessation  No Change    Warm-up and Cool-down  Performed as group-led instruction    Resistance Training Performed  Yes    VAD Patient?  No    PAD/SET Patient?  No      Pain Assessment   Currently in Pain?  No/denies    Multiple Pain Sites  No       Capillary Blood Glucose: No results found for this or any previous visit (from the past 24 hour(s)).    Social History   Tobacco Use  Smoking Status Former Smoker  . Packs/day: 1.00  . Years: 40.00  . Pack years: 40.00  . Types: Cigarettes  . Last attempt to quit: 03/20/2003  . Years since quitting: 15.3  Smokeless Tobacco Never Used    Goals Met:  Proper associated with RPD/PD & O2 Sat Exercise tolerated well  Goals Unmet:  Not Applicable  Comments: Service time is from 1330 to 1500    Dr. Rush Farmer is Medical Director for Pulmonary Rehab at [facility].

## 2018-07-10 ENCOUNTER — Encounter (HOSPITAL_COMMUNITY)
Admission: RE | Admit: 2018-07-10 | Discharge: 2018-07-10 | Disposition: A | Discharge status: Home / Self Care | Payer: Medicare Other | Source: Ambulatory Visit | Attending: Pulmonary Disease | Admitting: Pulmonary Disease

## 2018-07-10 DIAGNOSIS — J9611 Chronic respiratory failure with hypoxia: Secondary | ICD-10-CM

## 2018-07-10 NOTE — Progress Notes (Signed)
Daily Session Note  Patient Details  Name: [name] MRN: [id number] Date of Birth: 21-Nov-1934 Referring Provider:     Pulmonary Rehab Walk Test from 04/10/2018 in [place]  Referring Provider  Dr. Elsworth Soho      Encounter Date: 07/10/2018  Check In: Session Check In - 07/10/18 1330      Check-In   Supervising physician immediately available to respond to emergencies  [facility] immediately available    Physician(s)  Dr. Starla Link    Location  [facility]    Staff Present  Rosebud Poles, RN, BSN;Carlette Wilber Oliphant, RN, Bjorn Loser, MS, Exercise Physiologist;Annedrea Rosezella [place], RN, MHA    Medication changes reported      No    Fall or balance concerns reported     No    Tobacco Cessation  No Change    Warm-up and Cool-down  Performed as group-led instruction    Resistance Training Performed  Yes    VAD Patient?  No    PAD/SET Patient?  No      Pain Assessment   Currently in Pain?  No/denies    Multiple Pain Sites  No       Capillary Blood Glucose: No results found for this or any previous visit (from the past 24 hour(s)).    Social History   Tobacco Use  Smoking Status Former Smoker  . Packs/day: 1.00  . Years: 40.00  . Pack years: 40.00  . Types: Cigarettes  . Last attempt to quit: 03/20/2003  . Years since quitting: 15.3  Smokeless Tobacco Never Used    Goals Met:  Proper associated with RPD/PD & O2 Sat Exercise tolerated well  Goals Unmet:  Not Applicable  Comments: Service time is from 1330 to 1530    Dr. Rush Farmer is Medical Director for Pulmonary Rehab at [facility].

## 2018-07-14 DIAGNOSIS — H2000 Unspecified acute and subacute iridocyclitis: Secondary | ICD-10-CM | POA: Diagnosis not present

## 2018-07-15 ENCOUNTER — Encounter (HOSPITAL_COMMUNITY)
Admission: RE | Admit: 2018-07-15 | Discharge: 2018-07-15 | Disposition: A | Discharge status: Home / Self Care | Payer: Medicare Other | Source: Ambulatory Visit | Attending: Pulmonary Disease | Admitting: Pulmonary Disease

## 2018-07-15 VITALS — Wt 156.1 lb

## 2018-07-15 DIAGNOSIS — H10411 Chronic giant papillary conjunctivitis, right eye: Secondary | ICD-10-CM | POA: Diagnosis not present

## 2018-07-15 DIAGNOSIS — J9611 Chronic respiratory failure with hypoxia: Secondary | ICD-10-CM | POA: Diagnosis not present

## 2018-07-15 DIAGNOSIS — H2000 Unspecified acute and subacute iridocyclitis: Secondary | ICD-10-CM | POA: Diagnosis not present

## 2018-07-15 NOTE — Progress Notes (Signed)
Daily Session Note  Patient Details  Name: [name] MRN: [id number] Date of Birth: October 21, 1934 Referring Provider:     Pulmonary Rehab Walk Test from 04/10/2018 in [place]  Referring Provider  Dr. Elsworth Soho      Encounter Date: 07/15/2018  Check In: Session Check In - 07/15/18 1330      Check-In   Supervising physician immediately available to respond to emergencies  [facility] immediately available    Physician(s)  Dr. Starla Link    Location  [facility]    Staff Present  Rosebud Poles, RN, BSN;Carlette Wilber Oliphant, RN, Bjorn Loser, MS, Exercise Physiologist;Anatasia Tino Ysidro Evert, Felipe Drone, RN, MHA    Medication changes reported      No    Fall or balance concerns reported     No    Tobacco Cessation  No Change    Warm-up and Cool-down  Performed as group-led instruction    Resistance Training Performed  Yes    VAD Patient?  No    PAD/SET Patient?  No      Pain Assessment   Currently in Pain?  No/denies    Multiple Pain Sites  No       Capillary Blood Glucose: No results found for this or any previous visit (from the past 24 hour(s)).  Exercise Prescription Changes - 07/15/18 1600      Response to Exercise   Blood Pressure (Admit)  136/82    Blood Pressure (Exercise)  130/80    Blood Pressure (Exit)  120/78    Heart Rate (Admit)  95 bpm    Heart Rate (Exercise)  97 bpm    Heart Rate (Exit)  88 bpm    Oxygen Saturation (Admit)  96 %    Oxygen Saturation (Exercise)  94 %    Oxygen Saturation (Exit)  100 %    Rating of Perceived Exertion (Exercise)  12    Perceived Dyspnea (Exercise)  2    Duration  Progress to 45 minutes of aerobic exercise without signs/symptoms of physical distress    Intensity  THRR unchanged      Progression   Progression  Continue to progress workloads to maintain intensity without signs/symptoms of physical distress.      Resistance Training   Training Prescription  Yes    Weight  orange bands    Reps  10-15    Time  10 Minutes      Oxygen   Oxygen  Continuous    Liters  2      NuStep   Level  2    SPM  80    Minutes  34    METs  1.9      Track   Laps  6    Minutes  17       Social History   Tobacco Use  Smoking Status Former Smoker  . Packs/day: 1.00  . Years: 40.00  . Pack years: 40.00  . Types: Cigarettes  . Last attempt to quit: 03/20/2003  . Years since quitting: 15.3  Smokeless Tobacco Never Used    Goals Met:  Exercise tolerated well No report of cardiac concerns or symptoms Strength training completed today  Goals Unmet:  Not Applicable  Comments: Service time is from 1330 to 1510. Pt's eyes very blood shot and irritated today. She said that she went to the ophthalmologist yesterday. [place] has glaucoma and the pressure was up in her eyes. Her eye drops were  changed and she believes that has caused her eyes to become very irritated. She contacted their office and she was waiting for them to return her call. I offered to call the office but she declines my offer. I impressed that she needs to be seen today by them,she voiced understanding.    Dr. Rush Farmer is Medical Director for Pulmonary Rehab at [facility].

## 2018-07-16 NOTE — Progress Notes (Signed)
Pulmonary Individual Treatment Plan  Patient Details  Name: [name] MRN: [id number] Date of Birth: 1934-11-30 Referring Provider:     Pulmonary Rehab Walk Test from 04/10/2018 in [place]  Referring Provider  Dr. Elsworth Soho      Initial Encounter Date:    Pulmonary Rehab Walk Test from 04/10/2018 in [place]  Date  04/11/18      Visit Diagnosis: Chronic respiratory failure with hypoxia ([employer])  Patient's Home Medications on Admission:   Current Outpatient Medications:  .  albuterol (PROVENTIL HFA;VENTOLIN HFA) 108 (90 Base) MCG/ACT inhaler, Inhale 2 puffs into the lungs every 6 (six) hours as needed for wheezing or shortness of breath., Disp: 1 Inhaler, Rfl: 2 .  arformoterol (BROVANA) 15 MCG/2ML NEBU, Take 2 mLs (15 mcg total) by nebulization 2 (two) times daily., Disp: 120 mL, Rfl: 5 .  atorvastatin (LIPITOR) 10 MG tablet, TAKE ONE TABLET DAILY AT 6PM, Disp: 60 tablet, Rfl: 1 .  budesonide (PULMICORT) 0.5 MG/2ML nebulizer solution, Take 2 mLs (0.5 mg total) by nebulization 2 (two) times daily., Disp: 120 mL, Rfl: 5 .  DIGOX 125 MCG tablet, TAKE ONE TABLET DAILY, Disp: 90 tablet, Rfl: 0 .  diltiazem (CARDIZEM CD) 240 MG 24 hr capsule, TAKE ONE CAPSULE EACH DAY, Disp: 90 capsule, Rfl: 0 .  latanoprost (XALATAN) 0.005 % ophthalmic solution, Place 1 drop into both eyes at bedtime. Reported on 11/09/2015, Disp: , Rfl:  .  pantoprazole (PROTONIX) 40 MG tablet, TAKE ONE TABLET DAILY, Disp: 90 tablet, Rfl: 1 .  SIMBRINZA 1-0.2 % SUSP, Place 1 drop into both eyes 2 (two) times daily. , Disp: , Rfl: 66  Past Medical History: Past Medical History:  Diagnosis Date  . Arthritis   . COPD (chronic obstructive pulmonary disease) ([place])   . Dyspnea on exertion   . GERD (gastroesophageal reflux disease)   . Glaucoma of both eyes   . History of atrial fibrillation without current medication 2010   POST SURG 2010--  PER PT NO  ISSUES SINCE  . History of breast cancer    DX DCIS IN 2004--  S/P RIGHT MASTECTOMY , NO CHEMORADIATION---  NO RECURRENCE  . History of CHF (congestive heart failure)    POST SURG 2010  . History of hypertension   . History of shingles    02/ 2015--  back of neck and left flank--  no residual pain  . ICH (intracerebral hemorrhage) ([place]) 02/05/2015  . Nephrolithiasis   . Recurrent bladder papillary carcinoma ([place]) first dx 06/ 2014   s/p  turbt's  and instillation mitomycin c (chemo)//dx again in 2015-different type    Tobacco Use: Social History   Tobacco Use  Smoking Status Former Smoker  . Packs/day: 1.00  . Years: 40.00  . Pack years: 40.00  . Types: Cigarettes  . Last attempt to quit: 03/20/2003  . Years since quitting: 15.3  Smokeless Tobacco Never Used    Labs: Recent Chemical engineer    Labs for ITP Cardiac and Pulmonary Rehab Latest Ref Rng & Units 07/11/2015 11/09/2015 05/09/2016 04/19/2017 12/27/2017   Cholestrol 100 - 199 mg/dL 125 - 131 121 -   LDLCALC 0 - 99 mg/dL 59 - 48 52 -   HDL >39 mg/dL 48 - 46.80 46 -   Trlycerides 0 - 149 mg/dL 92 - 184.0(H) 114 -   Hemoglobin A1c 4.8 - 5.6 % - 6.1 6.0 - 5.9(H)   TCO2 0 -  100 mmol/L - - - - -      Capillary Blood Glucose: Lab Results  Component Value Date   GLUCAP 85 12/28/2017   GLUCAP 83 12/28/2017   GLUCAP 129 (H) 12/27/2017   GLUCAP 169 (H) 12/27/2017   GLUCAP 145 (H) 02/05/2015     Pulmonary Assessment Scores: Pulmonary Assessment Scores    Row Name 04/11/18 1003         ADL UCSD   ADL Phase  Entry       mMRC Score   mMRC Score  2        Pulmonary Function Assessment:   Exercise Target Goals: Exercise Program Goal: Individual exercise prescription set using results from initial 6 min walk test and THRR while considering  patient's activity barriers and safety.   Exercise Prescription Goal: Initial exercise prescription builds to 30-45 minutes a day of aerobic activity, 2-3 days per week.   Home exercise guidelines will be given to patient during program as part of exercise prescription that the participant will acknowledge.  Activity Barriers & Risk Stratification: Activity Barriers & Cardiac Risk Stratification - 04/09/18 1615      Activity Barriers & Cardiac Risk Stratification   Cardiac Risk Stratification  Low       6 Minute Walk: 6 Minute Walk    Row Name 04/11/18 0958         6 Minute Walk   Phase  Initial     Distance  700 feet     Walk Time  6 minutes     # of Rest Breaks  - 1 standing rest break for 30 seconds     MPH  1.33     METS  2     RPE  12     Perceived Dyspnea   2     Comments  used wheelchair/ used forehead probe/blurry vision during 91mt which resided/lost balance around corners due to stroke     Resting HR  68 bpm     Resting BP  104/70     Resting Oxygen Saturation   92 %     Exercise Oxygen Saturation  during 6 min walk  88 %     Max Ex. HR  89 bpm     Max Ex. BP  130/64       Interval HR   1 Minute HR  70     2 Minute HR  78     3 Minute HR  73     4 Minute HR  89     5 Minute HR  82     6 Minute HR  67     Interval Heart Rate?  Yes       Interval Oxygen   Interval Oxygen?  Yes     Baseline Oxygen Saturation %  92 %     1 Minute Oxygen Saturation %  88 %     1 Minute Liters of Oxygen  2 L     2 Minute Oxygen Saturation %  94 %     2 Minute Liters of Oxygen  3 L     3 Minute Oxygen Saturation %  92 %     3 Minute Liters of Oxygen  3 L     4 Minute Oxygen Saturation %  93 %     4 Minute Liters of Oxygen  3 L     5 Minute Oxygen Saturation %  96 %  5 Minute Liters of Oxygen  3 L     6 Minute Oxygen Saturation %  88 %     6 Minute Liters of Oxygen  3 L        Oxygen Initial Assessment: Oxygen Initial Assessment - 04/11/18 0957      Initial 6 min Walk   Oxygen Used  Continuous;E-Tanks    Liters per minute  3      Program Oxygen Prescription   Program Oxygen Prescription  Continuous;E-Tanks    Liters per minute   3    Comments  Needs to use forehead probe since fingers are cold and nails are always painted       Oxygen Re-Evaluation: Oxygen Re-Evaluation    Row Name 04/22/18 0736 05/20/18 0740 06/17/18 1641 07/16/18 0913       Program Oxygen Prescription   Program Oxygen Prescription  Continuous;E-Tanks  Continuous;E-Tanks  Continuous;E-Tanks  Continuous;E-Tanks    Liters per minute  _0 Comments  Needs to use forehead probe since fingers are cold and nails are always painted  Needs to use forehead probe since fingers are cold and nails are always painted  Needs to use forehead probe since fingers are cold and nails are always painted  Needs to use forehead probe since fingers are cold and nails are always painted      [place] Concentrator;Portable Concentrator    Sleep Oxygen Prescription  Continuous  Continuous  Continuous  Continuous    Liters per minute  _1 Home Exercise Oxygen Prescription  Continuous  Continuous  Continuous  Continuous    Liters per minute  _2 Home at Rest Exercise Oxygen Prescription  Continuous  Continuous  Continuous  Continuous    Liters per minute  _3 Compliance with Home Oxygen Use  No  No  Yes  Yes      Goals/Expected Outcomes   Short Term Goals  To learn and exhibit compliance with exercise, home and travel O2 prescription;To learn and understand importance of monitoring SPO2 with pulse oximeter and demonstrate accurate use of the pulse oximeter.;To learn and understand importance of maintaining oxygen saturations>88%;To learn and demonstrate proper pursed lip breathing techniques or other breathing techniques.;To learn and demonstrate proper use of respiratory medications  To learn and exhibit compliance with exercise, home and travel O2 prescription;To learn and understand  importance of monitoring SPO2 with pulse oximeter and demonstrate accurate use of the pulse oximeter.;To learn and understand importance of maintaining oxygen saturations>88%;To learn and demonstrate proper pursed lip breathing techniques or other breathing techniques.;To learn and demonstrate proper use of respiratory medications  To learn and exhibit compliance with exercise, home and travel O2 prescription;To learn and understand importance of monitoring SPO2 with pulse oximeter and demonstrate accurate use of the pulse oximeter.;To learn and understand importance of maintaining oxygen saturations>88%;To learn and demonstrate proper pursed lip breathing techniques or other breathing techniques.;To learn and demonstrate proper use of respiratory medications  To learn and exhibit compliance with exercise, home and travel O2 prescription;To learn and understand importance of monitoring SPO2 with pulse oximeter and demonstrate accurate use of the pulse oximeter.;To learn and understand importance of maintaining oxygen saturations>88%;To learn and  demonstrate proper pursed lip breathing techniques or other breathing techniques.;To learn and demonstrate proper use of respiratory medications    Long  Term Goals  Exhibits compliance with exercise, home and travel O2 prescription;Verbalizes importance of monitoring SPO2 with pulse oximeter and return demonstration;Maintenance of O2 saturations>88%;Exhibits proper breathing techniques, such as pursed lip breathing or other method taught during program session;Compliance with respiratory medication;Demonstrates proper use of MDI's  Exhibits compliance with exercise, home and travel O2 prescription;Verbalizes importance of monitoring SPO2 with pulse oximeter and return demonstration;Maintenance of O2 saturations>88%;Exhibits proper breathing techniques, such as pursed lip breathing or other method taught during program session;Compliance with respiratory  medication;Demonstrates proper use of MDI's  Exhibits compliance with exercise, home and travel O2 prescription;Verbalizes importance of monitoring SPO2 with pulse oximeter and return demonstration;Maintenance of O2 saturations>88%;Exhibits proper breathing techniques, such as pursed lip breathing or other method taught during program session;Compliance with respiratory medication;Demonstrates proper use of MDI's  Exhibits compliance with exercise, home and travel O2 prescription;Verbalizes importance of monitoring SPO2 with pulse oximeter and return demonstration;Maintenance of O2 saturations>88%;Exhibits proper breathing techniques, such as pursed lip breathing or other method taught during program session;Compliance with respiratory medication;Demonstrates proper use of MDI's    Goals/Expected Outcomes  compliance and understanding  compliance and understanding  compliance and understanding  compliance and understanding       Oxygen Discharge (Final Oxygen Re-Evaluation): Oxygen Re-Evaluation - 07/16/18 0913      Program Oxygen Prescription   Program Oxygen Prescription  Continuous;E-Tanks    Liters per minute  2    Comments  Needs to use forehead probe since fingers are cold and nails are always painted      Home Oxygen   Home Oxygen Device  Home Concentrator;Portable Concentrator    Sleep Oxygen Prescription  Continuous    Liters per minute  2    Home Exercise Oxygen Prescription  Continuous    Liters per minute  2    Home at Rest Exercise Oxygen Prescription  Continuous    Liters per minute  2    Compliance with Home Oxygen Use  Yes      Goals/Expected Outcomes   Short Term Goals  To learn and exhibit compliance with exercise, home and travel O2 prescription;To learn and understand importance of monitoring SPO2 with pulse oximeter and demonstrate accurate use of the pulse oximeter.;To learn and understand importance of maintaining oxygen saturations>88%;To learn and demonstrate proper  pursed lip breathing techniques or other breathing techniques.;To learn and demonstrate proper use of respiratory medications    Long  Term Goals  Exhibits compliance with exercise, home and travel O2 prescription;Verbalizes importance of monitoring SPO2 with pulse oximeter and return demonstration;Maintenance of O2 saturations>88%;Exhibits proper breathing techniques, such as pursed lip breathing or other method taught during program session;Compliance with respiratory medication;Demonstrates proper use of MDI's    Goals/Expected Outcomes  compliance and understanding       Initial Exercise Prescription: Initial Exercise Prescription - 04/11/18 1000      Date of Initial Exercise RX and Referring Provider   Date  04/11/18    Referring Provider  Dr. Elsworth Soho      Oxygen   Oxygen  Continuous    Liters  3      Recumbant Bike   Level  1    RPM  10    Minutes  17      NuStep   Level  1    SPM  80    Minutes  17    METs  1.3      Prescription Details   Frequency (times per week)  2    Duration  Progress to 45 minutes of aerobic exercise without signs/symptoms of physical distress      Intensity   THRR 40-80% of Max Heartrate  56-112    Ratings of Perceived Exertion  11-13    Perceived Dyspnea  0-4      Progression   Progression  Continue progressive overload as per policy without signs/symptoms or physical distress.      Resistance Training   Training Prescription  Yes    Weight  orange bands    Reps  10-15       Perform Capillary Blood Glucose checks as needed.  Exercise Prescription Changes:  Exercise Prescription Changes    Row Name 04/22/18 1600 05/06/18 1500 05/20/18 1600 06/03/18 1600 06/17/18 1600     Response to Exercise   Blood Pressure (Admit)  102/70  104/64  110/60  95/58  104/64   Blood Pressure (Exercise)  132/76  110/70  140/80  110/68  100/64   Blood Pressure (Exit)  102/70  120/60  106/60  113/69  112/78   Heart Rate (Admit)  67 bpm  68 bpm  66 bpm   67 bpm  76 bpm   Heart Rate (Exercise)  88 bpm  94 bpm  96 bpm  81 bpm  83 bpm   Heart Rate (Exit)  67 bpm  76 bpm  64 bpm  87 bpm  85 bpm   Oxygen Saturation (Admit)  95 %  98 %  94 %  97 %  95 %   Oxygen Saturation (Exercise)  92 %  92 %  97 %  88 %  92 %   Oxygen Saturation (Exit)  96 %  97 %  97 %  98 %  96 %   Rating of Perceived Exertion (Exercise)  _0 Perceived Dyspnea (Exercise)  _1 Duration  Progress to 45 minutes of aerobic exercise without signs/symptoms of physical distress  Progress to 45 minutes of aerobic exercise without signs/symptoms of physical distress  Progress to 45 minutes of aerobic exercise without signs/symptoms of physical distress  Progress to 45 minutes of aerobic exercise without signs/symptoms of physical distress  Progress to 45 minutes of aerobic exercise without signs/symptoms of physical distress   Intensity  Other (comment) 40-80% of HRR  THRR unchanged  THRR unchanged  THRR unchanged  THRR unchanged     Progression   Progression  Continue to progress workloads to maintain intensity without signs/symptoms of physical distress.  Continue to progress workloads to maintain intensity without signs/symptoms of physical distress.  Continue to progress workloads to maintain intensity without signs/symptoms of physical distress.  Continue to progress workloads to maintain intensity without signs/symptoms of physical distress.  Continue to progress workloads to maintain intensity without signs/symptoms of physical distress.     Resistance Training   Training Prescription  Yes  Yes  Yes  Yes  Yes   Weight  orange bands  orange bands  orange bands  orange bands  orange bands   Reps  10-15  10-15  10-15  10-15  10-15   Time  10 Minutes  10 Minutes  10 Minutes  10 Minutes  10 Minutes     Oxygen   Oxygen  Continuous  Continuous  Continuous  Continuous  Continuous   Liters  _0 NuStep   Level  _1 SPM  80  80  80   80  80   Minutes  34  34  34  34  34   METs  -  1.8  2  1.9  1.9     Track   Laps  _2 -   Minutes  _3 -   Row Name 07/01/18 1600 07/15/18 1600           Response to Exercise   Blood Pressure (Admit)  102/52  136/82      Blood Pressure (Exercise)  124/62  130/80      Blood Pressure (Exit)  102/78  120/78      Heart Rate (Admit)  74 bpm  95 bpm      Heart Rate (Exercise)  81 bpm  97 bpm      Heart Rate (Exit)  88 bpm  88 bpm      Oxygen Saturation (Admit)  95 %  96 %      Oxygen Saturation (Exercise)  91 %  94 %      Oxygen Saturation (Exit)  99 %  100 %      Rating of Perceived Exertion (Exercise)  12  12      Perceived Dyspnea (Exercise)  2  2      Duration  Progress to 45 minutes of aerobic exercise without signs/symptoms of physical distress  Progress to 45 minutes of aerobic exercise without signs/symptoms of physical distress      Intensity  THRR unchanged  THRR unchanged        Progression   Progression  Continue to progress workloads to maintain intensity without signs/symptoms of physical distress.  Continue to progress workloads to maintain intensity without signs/symptoms of physical distress.        Resistance Training   Training Prescription  Yes  Yes      Weight  orange bands  orange bands      Reps  10-15  10-15      Time  10 Minutes  10 Minutes        Oxygen   Oxygen  Continuous  Continuous      Liters  2  2        NuStep   Level  2  2      SPM  80  80      Minutes  34  34      METs  1.9  1.9        Track   Laps  10  6      Minutes  17  17         Exercise Comments:  Exercise Comments    Row Name 06/19/18 0831           Exercise Comments  Home exercise completed          Exercise Goals and Review:  Exercise Goals    Row Name 04/09/18 1615             Exercise Goals   Increase Physical Activity  Yes       Intervention  Provide advice, education, support and counseling about physical activity/exercise  needs.;Develop an  individualized exercise prescription for aerobic and resistive training based on initial evaluation findings, risk stratification, comorbidities and participant's personal goals.       Expected Outcomes  Short Term: Attend rehab on a regular basis to increase amount of physical activity.;Long Term: Add in home exercise to make exercise part of routine and to increase amount of physical activity.;Long Term: Exercising regularly at least 3-5 days a week.       Increase Strength and Stamina  Yes       Intervention  Provide advice, education, support and counseling about physical activity/exercise needs.;Develop an individualized exercise prescription for aerobic and resistive training based on initial evaluation findings, risk stratification, comorbidities and participant's personal goals.       Expected Outcomes  Short Term: Increase workloads from initial exercise prescription for resistance, speed, and METs.;Short Term: Perform resistance training exercises routinely during rehab and add in resistance training at home;Long Term: Improve cardiorespiratory fitness, muscular endurance and strength as measured by increased METs and functional capacity (6MWT)       Able to understand and use rate of perceived exertion (RPE) scale  Yes       Intervention  Provide education and explanation on how to use RPE scale       Expected Outcomes  Short Term: Able to use RPE daily in rehab to express subjective intensity level;Long Term:  Able to use RPE to guide intensity level when exercising independently       Able to understand and use Dyspnea scale  Yes       Intervention  Provide education and explanation on how to use Dyspnea scale       Expected Outcomes  Short Term: Able to use Dyspnea scale daily in rehab to express subjective sense of shortness of breath during exertion;Long Term: Able to use Dyspnea scale to guide intensity level when exercising independently       Knowledge and  understanding of Target Heart Rate Range (THRR)  Yes       Intervention  Provide education and explanation of THRR including how the numbers were predicted and where they are located for reference       Expected Outcomes  Short Term: Able to state/look up THRR;Short Term: Able to use daily as guideline for intensity in rehab;Long Term: Able to use THRR to govern intensity when exercising independently       Understanding of Exercise Prescription  Yes       Intervention  Provide education, explanation, and written materials on patient's individual exercise prescription       Expected Outcomes  Short Term: Able to explain program exercise prescription;Long Term: Able to explain home exercise prescription to exercise independently          Exercise Goals Re-Evaluation : Exercise Goals Re-Evaluation    Row Name 04/22/18 0736 05/20/18 0741 06/17/18 1645 07/16/18 0914       Exercise Goal Re-Evaluation   Exercise Goals Review  Increase Physical Activity;Increase Strength and Stamina;Able to understand and use rate of perceived exertion (RPE) scale;Able to understand and use Dyspnea scale;Knowledge and understanding of Target Heart Rate Range (THRR);Understanding of Exercise Prescription  Increase Physical Activity;Increase Strength and Stamina;Able to understand and use rate of perceived exertion (RPE) scale;Able to understand and use Dyspnea scale;Knowledge and understanding of Target Heart Rate Range (THRR);Understanding of Exercise Prescription  Increase Physical Activity;Increase Strength and Stamina;Able to understand and use rate of perceived exertion (RPE) scale;Able to understand and use Dyspnea scale;Knowledge and understanding  of Target Heart Rate Range (THRR);Understanding of Exercise Prescription  Increase Physical Activity;Increase Strength and Stamina;Able to understand and use rate of perceived exertion (RPE) scale;Able to understand and use Dyspnea scale;Knowledge and understanding of Target  Heart Rate Range (THRR);Understanding of Exercise Prescription    Comments  Patient's first day of exercise should be today.   Patient's progress will be slow due to extreme shortness of breath and deconditioning. Patient's average has increased from 1.5 to 1.9. Patient is able to walk 11 laps (200 ft each) in 15 minutes. Will cont to monitor and motivate.  Patient's progress will be slow due to extreme shortness of breath and deconditioning. Patient's average has increased from 1.5 to 1.9. Patient is able to walk 11 laps (200 ft each) in 15 minutes. Patient is interested in Pulmonary Rehab Maintenance when she graduates. I discussed home exercise with patient and highly encouraged. Will cont to monitor and motivate.  Pt has had slow progress. METs remain low ranging from 1.6 to 2.0. Pt walks 6-10 laps (1 lap=200 feet) in 15 minutes. Will continue to monitor and progress as able.     Expected Outcomes  Through rehab and exercising at home, patient will find it easier to carry out ADL's and will gain the confidence to establish an exercise routine at home.   Through rehab and exercising at home, patient will find it easier to carry out ADL's and will gain the confidence to establish an exercise routine at home.   Through rehab and exercising at home, patient will find it easier to carry out ADL's and will gain the confidence to establish an exercise routine at home.   Through rehab and exercising at home, patient will find it easier to carry out ADL's and will gain the confidence to establish an exercise routine at home.        Discharge Exercise Prescription (Final Exercise Prescription Changes): Exercise Prescription Changes - 07/15/18 1600      Response to Exercise   Blood Pressure (Admit)  136/82    Blood Pressure (Exercise)  130/80    Blood Pressure (Exit)  120/78    Heart Rate (Admit)  95 bpm    Heart Rate (Exercise)  97 bpm    Heart Rate (Exit)  88 bpm    Oxygen Saturation (Admit)  96 %     Oxygen Saturation (Exercise)  94 %    Oxygen Saturation (Exit)  100 %    Rating of Perceived Exertion (Exercise)  12    Perceived Dyspnea (Exercise)  2    Duration  Progress to 45 minutes of aerobic exercise without signs/symptoms of physical distress    Intensity  THRR unchanged      Progression   Progression  Continue to progress workloads to maintain intensity without signs/symptoms of physical distress.      Resistance Training   Training Prescription  Yes    Weight  orange bands    Reps  10-15    Time  10 Minutes      Oxygen   Oxygen  Continuous    Liters  2      NuStep   Level  2    SPM  80    Minutes  34    METs  1.9      Track   Laps  6    Minutes  17       Nutrition:  Target Goals: Understanding of nutrition guidelines, daily intake of sodium <1532m, cholesterol <2064m calories  30% from fat and 7% or less from saturated fats, daily to have 5 or more servings of fruits and vegetables.  Biometrics:    Nutrition Therapy Plan and Nutrition Goals: Nutrition Therapy & Goals - 04/10/18 1403      Nutrition Therapy   Diet  consistent carbohydrate heart healthy      Personal Nutrition Goals   Nutrition Goal  Pt to identify and limit food sources of sodium, saturated fat, refined carbohydrates    Personal Goal #2  Identify food quantities necessary to achieve wt loss of  -1# per week to a goal wt loss of 5-7 lb at graduation from pulmonary rehab.    Personal Goal #3  Pt able to name foods that affect blood glucose       Intervention Plan   Intervention  Prescribe, educate and counsel regarding individualized specific dietary modifications aiming towards targeted core components such as weight, hypertension, lipid management, diabetes, heart failure and other comorbidities.    Expected Outcomes  Short Term Goal: Understand basic principles of dietary content, such as calories, fat, sodium, cholesterol and nutrients.       Nutrition Assessments: Nutrition  Assessments - 04/10/18 1404      Rate Your Plate Scores   Pre Score  43       Nutrition Goals Re-Evaluation:   Nutrition Goals Discharge (Final Nutrition Goals Re-Evaluation):   Psychosocial: Target Goals: Acknowledge presence or absence of significant depression and/or stress, maximize coping skills, provide positive support system. Participant is able to verbalize types and ability to use techniques and skills needed for reducing stress and depression.  Initial Review & Psychosocial Screening: Initial Psych Review & Screening - 04/09/18 1611      Family Dynamics   Comments  pt with no identifiable barriers.  Pt denes any stress or anxiety       Quality of Life Scores:  Scores of 19 and below usually indicate a poorer quality of life in these areas.  A difference of  2-3 points is a clinically meaningful difference.  A difference of 2-3 points in the total score of the Quality of Life Index has been associated with significant improvement in overall quality of life, self-image, physical symptoms, and general health in studies assessing change in quality of life.  PHQ-9: Recent Review Flowsheet Data    Depression screen [facility] 2/9 04/09/2018 12/27/2017 11/12/2015 05/20/2015 04/22/2015   Decreased Interest 0 0 0 0 0   Down, Depressed, Hopeless 0 0 0 0 0   PHQ - 2 Score 0 0 0 0 0   Altered sleeping 0 - - - 1   Tired, decreased energy 1 - - - 0   Change in appetite 0 - - - 0   Feeling bad or failure about yourself  0 - - - 0   Trouble concentrating 0 - - - 0   Moving slowly or fidgety/restless 0 - - - 0   Suicidal thoughts 0 - - - 0   PHQ-9 Score 1 - - - 1   Difficult doing work/chores Not difficult at all - - - -     Interpretation of Total Score  Total Score Depression Severity:  1-4 = Minimal depression, 5-9 = Mild depression, 10-14 = Moderate depression, 15-19 = Moderately severe depression, 20-27 = Severe depression   Psychosocial Evaluation and Intervention: Psychosocial  Evaluation - 07/16/18 1234      Psychosocial Evaluation & Interventions   Interventions  Stress management education;Relaxation education;Encouraged to  exercise with the program and follow exercise prescription    Comments  Pt has positive and healthy coping skills with supportive family. Daughter will sometime accompany her to exercise.     Expected Outcomes  Pt will continue to demonstrate positive coping skills and healthy outlook.    Continue Psychosocial Services   Follow up required by staff       Psychosocial Re-Evaluation: Psychosocial Re-Evaluation    [place] Name 05/22/18 1321 06/18/18 1939 07/16/18 1234         Psychosocial Re-Evaluation   Current issues with  None Identified  None Identified  None Identified     Comments  pt reports no barriers to participating in pulmonary rehab.  Pt appears to enjoy coming to [facility] and is beginning to feel better  pt reports no barriers to participating in pulmonary rehab.  Pt appears to enjoy coming to exericse and is beginning to feel better  pt reports no barriers to participating in pulmonary rehab.  Pt appears to enjoy coming to exericse and is beginning to feel better     Expected Outcomes  Pt will continue mental well being and adopt exercise as a part of her routine  Pt will continue mental well being and adopt exercise as a part of her routine  Pt will continue mental well being and adopt exercise as a part of her routine     Interventions  Stress management education;Encouraged to attend Pulmonary Rehabilitation for the exercise;Relaxation education  Stress management education;Encouraged to attend Pulmonary Rehabilitation for the exercise;Relaxation education  Stress management education;Encouraged to attend Pulmonary Rehabilitation for the exercise;Relaxation education     Continue Psychosocial Services   Follow up required by staff  Follow up required by staff  Follow up required by staff        Psychosocial Discharge (Final  Psychosocial Re-Evaluation): Psychosocial Re-Evaluation - 07/16/18 1234      Psychosocial Re-Evaluation   Current issues with  None Identified    Comments  pt reports no barriers to participating in pulmonary rehab.  Pt appears to enjoy coming to exericse and is beginning to feel better    Expected Outcomes  Pt will continue mental well being and adopt exercise as a part of her routine    Interventions  Stress management education;Encouraged to attend Pulmonary Rehabilitation for the exercise;Relaxation education    Continue Psychosocial Services   Follow up required by staff       Education: Education Goals: Education classes will be provided on a weekly basis, covering required topics. Participant will state understanding/return demonstration of topics presented.  Learning Barriers/Preferences: Learning Barriers/Preferences - 04/09/18 1440      Learning Barriers/Preferences   Learning Barriers  Sight    Learning Preferences  Individual Instruction;Group Instruction;Verbal Instruction;Written Material       Education Topics: Risk Factor Reduction:  -Group instruction that is supported by a PowerPoint presentation. Instructor discusses the definition of a risk factor, different risk factors for pulmonary disease, and how the heart and lungs work together.     Nutrition for Pulmonary Patient:  -Group instruction provided by PowerPoint slides, verbal discussion, and written materials to support subject matter. The instructor gives an explanation and review of healthy diet recommendations, which includes a discussion on weight management, recommendations for fruit and vegetable consumption, as well as protein, fluid, caffeine, fiber, sodium, sugar, and alcohol. Tips for eating when patients are short of breath are discussed.   PULMONARY REHAB OTHER RESPIRATORY from 07/10/2018 in [place]  Date  05/01/18  Educator  Rodman Pickle  Instruction Review Code  2-  Demonstrated Understanding      Pursed Lip Breathing:  -Group instruction that is supported by demonstration and informational handouts. Instructor discusses the benefits of pursed lip and diaphragmatic breathing and detailed demonstration on how to preform both.     Oxygen Safety:  -Group instruction provided by PowerPoint, verbal discussion, and written material to support subject matter. There is an overview of "What is Oxygen" and "Why do we need it".  Instructor also reviews how to create a safe environment for oxygen use, the importance of using oxygen as prescribed, and the risks of noncompliance. There is a brief discussion on traveling with oxygen and resources the patient may utilize.   PULMONARY REHAB OTHER RESPIRATORY from 07/10/2018 in [place]  Date  06/05/18  Educator  EP  Instruction Review Code  1- Verbalizes Understanding      Oxygen Equipment:  -Group instruction provided by [employer] utilizing handouts, written materials, and Insurance underwriter.   Signs and Symptoms:  -Group instruction provided by written material and verbal discussion to support subject matter. Warning signs and symptoms of infection, stroke, and heart attack are reviewed and when to call the physician/911 reinforced. Tips for preventing the spread of infection discussed.   PULMONARY REHAB OTHER RESPIRATORY from 07/10/2018 in [place]  Date  05/29/18  Educator  rn  Instruction Review Code  1- Verbalizes Understanding      Advanced Directives:  -Group instruction provided by verbal instruction and written material to support subject matter. Instructor reviews Advanced Directive laws and proper instruction for filling out document.   Pulmonary Video:  -Group video education that reviews the importance of medication and oxygen compliance, exercise, good nutrition, pulmonary hygiene, and pursed lip and diaphragmatic  breathing for the pulmonary patient.   Exercise for the Pulmonary Patient:  -Group instruction that is supported by a PowerPoint presentation. Instructor discusses benefits of exercise, core components of exercise, frequency, duration, and intensity of an exercise routine, importance of utilizing pulse oximetry during exercise, safety while exercising, and options of places to exercise outside of rehab.     Pulmonary Medications:  -Verbally interactive group education provided by instructor with focus on inhaled medications and proper administration.   PULMONARY REHAB OTHER RESPIRATORY from 07/10/2018 in [place]  Date  05/13/18  Educator  pharm  Instruction Review Code  2- Demonstrated Understanding      Anatomy and Physiology of the Respiratory System and Intimacy:  -Group instruction provided by PowerPoint, verbal discussion, and written material to support subject matter. Instructor reviews respiratory cycle and anatomical components of the respiratory system and their functions. Instructor also reviews differences in obstructive and restrictive respiratory diseases with examples of each. Intimacy, Sex, and Sexuality differences are reviewed with a discussion on how relationships can change when diagnosed with pulmonary disease. Common sexual concerns are reviewed.   PULMONARY REHAB OTHER RESPIRATORY from 07/10/2018 in [place]  Date  07/10/18  Educator  RN  Instruction Review Code  1- Verbalizes Understanding      MD DAY -A group question and answer session with a medical doctor that allows participants to ask questions that relate to their pulmonary disease state.   PULMONARY REHAB OTHER RESPIRATORY from 07/10/2018 in [place]  Date  06/26/18  Educator  Dr. Nelda Marseille  Instruction Review Code  1- Verbalizes Understanding      OTHER EDUCATION -Group or individual verbal, written, or  video instructions that support the educational goals of the pulmonary rehab program.   PULMONARY REHAB OTHER RESPIRATORY from 07/10/2018 in [place]  Date  05/22/18  Educator  Cloyde Reams  Instruction Review Code  1- Verbalizes Understanding [Sedentary Lifestyle]      Holiday Eating Survival Tips:  -Group instruction provided by [employer], verbal discussion, and written materials to support subject matter. The instructor gives patients tips, tricks, and techniques to help them not only survive but enjoy the holidays despite the onslaught of food that accompanies the holidays.   Knowledge Questionnaire Score:   Core Components/Risk Factors/Patient Goals at Admission: Personal Goals and Risk Factors at Admission - 04/09/18 1436      Core Components/Risk Factors/Patient Goals on Admission    Weight Management  Yes;Weight Loss    Intervention  Weight Management: Develop a combined nutrition and exercise program designed to reach desired caloric intake, while maintaining appropriate intake of nutrient and fiber, sodium and fats, and appropriate energy expenditure required for the weight goal.;Weight Management: Provide education and appropriate resources to help participant work on and attain dietary goals.;Weight Management/Obesity: Establish reasonable short term and long term weight goals.    Admit Weight  157 lb 13.6 oz (71.6 kg)    Goal Weight: Short Term  153 lb (69.4 kg)    Goal Weight: Long Term  150 lb (68 kg)    Expected Outcomes  Short Term: Continue to assess and modify interventions until short term weight is achieved;Long Term: Adherence to nutrition and physical activity/exercise program aimed toward attainment of established weight goal;Weight Loss: Understanding of general recommendations for a balanced deficit meal plan, which promotes 1-2 lb weight loss per week and includes a negative energy balance of [phone] kcal/d;Understanding  recommendations for meals to include 15-35% energy as protein, 25-35% energy from fat, 35-60% energy from carbohydrates, less than 231m of dietary cholesterol, 20-35 gm of total fiber daily;Understanding of distribution of calorie intake throughout the day with the consumption of 4-5 meals/snacks    Improve shortness of breath with ADL's  Yes    Intervention  Provide education, individualized exercise plan and daily activity instruction to help decrease symptoms of SOB with activities of daily living.    Expected Outcomes  Short Term: Improve cardiorespiratory fitness to achieve a reduction of symptoms when performing ADLs;Long Term: Be able to perform more ADLs without symptoms or delay the onset of symptoms       Core Components/Risk Factors/Patient Goals Review:  Goals and Risk Factor Review    Row Name 04/23/18 1202 05/22/18 1322 06/18/18 1940 07/16/18 1235       Core Components/Risk Factors/Patient Goals Review   Personal Goals Review  Weight Management/Obesity;Improve shortness of breath with ADL's;Increase knowledge of respiratory medications and ability to use respiratory devices properly.;Develop more efficient breathing techniques such as purse lipped breathing and diaphragmatic breathing and practicing self-pacing with activity.  Weight Management/Obesity;Improve shortness of breath with ADL's;Increase knowledge of respiratory medications and ability to use respiratory devices properly.;Develop more efficient breathing techniques such as purse lipped breathing and diaphragmatic breathing and practicing self-pacing with activity.  Weight Management/Obesity;Improve shortness of breath with ADL's;Increase knowledge of respiratory medications and ability to use respiratory devices properly.;Develop more efficient breathing techniques such as purse lipped breathing and diaphragmatic breathing and practicing self-pacing with activity.  Weight Management/Obesity;Improve shortness  of breath with  ADL's;Increase knowledge of respiratory medications and ability to use respiratory devices properly.;Develop more efficient breathing techniques such as purse lipped breathing and diaphragmatic breathing and practicing self-pacing with activity.    Review  Pt has completed 1 exercise session on 8/20.  Unable to adequately assess at this time pt progress toward her patient goals  Pt has completed 7 exercise sessions, Pt has attended the respiratory medication class. Pt with weight loss of .7 kg since 8/22.  Pt with increase to level 2 on the nustep and averages 10 laps in 15 minutes.  Pt rates her dyspnea level 2.  I anticipate with consistent attendance, pt will show measurable progress in the next 30 day assessment.  Pt has completed 13 exercise sessions and 5 education classes.  Pt had brief absence for minor bladder procedure.  Pt has history of bladder CA,  Pt with weight loss of .8 kg since 8/22.  Pt maintained level 2 on the nustep and averages 10 laps in 15 minutes.  Pt rates her dyspnea level 2.  I anticipate with consistent attendance, pt will show measurable progress in the next 30 day assessment.  Pt has completed 20 exercise sessions and 7 education classes.  Pt with weight loss of 1.6 kg since 8/22.  Pt maintained level 2 on the nustep and averages 8-10 laps in 15 minutes.  Pt rates her dyspnea level 2. Pt resiistant to increasing workloads. Difficult to motivate to increase activity.  Will moitor any  progress in the next 30 day assessment.    Expected Outcomes  See Admission Outcomes/Goals  See Admission Outcomes/Goals  See Admission Outcomes/Goals  See Admission Outcomes/Goals       Core Components/Risk Factors/Patient Goals at Discharge (Final Review):  Goals and Risk Factor Review - 07/16/18 1235      Core Components/Risk Factors/Patient Goals Review   Personal Goals Review  Weight Management/Obesity;Improve shortness of breath with ADL's;Increase knowledge of respiratory medications and  ability to use respiratory devices properly.;Develop more efficient breathing techniques such as purse lipped breathing and diaphragmatic breathing and practicing self-pacing with activity.    Review  Pt has completed 20 exercise sessions and 7 education classes.  Pt with weight loss of 1.6 kg since 8/22.  Pt maintained level 2 on the nustep and averages 8-10 laps in 15 minutes.  Pt rates her dyspnea level 2. Pt resiistant to increasing workloads. Difficult to motivate to increase activity.  Will moitor any  progress in the next 30 day assessment.    Expected Outcomes  See Admission Outcomes/Goals       ITP Comments: ITP Comments    Row Name 04/09/18 1431 04/23/18 1201 05/22/18 1320 06/18/18 1939 07/16/18 1234   ITP Comments  Dr. Jennet Maduro, Medical Director  Dr. Jennet Maduro, Medical Director  Dr. Jennet Maduro, Medical Director Pulmonary Rehab  Dr. Jennet Maduro, Medical Director Pulmonary Rehab  Dr. Jennet Maduro, Medical Director Pulmonary Rehab      Comments: Pt has completed 20 exercise sessions.  Pt is nearing graduation.  Continue to monitor pt progress. Cherre Huger, BSN Cardiac and Training and development officer

## 2018-07-17 ENCOUNTER — Encounter (HOSPITAL_COMMUNITY): Payer: Medicare Other

## 2018-07-18 DIAGNOSIS — H2000 Unspecified acute and subacute iridocyclitis: Secondary | ICD-10-CM | POA: Diagnosis not present

## 2018-07-22 ENCOUNTER — Encounter (HOSPITAL_COMMUNITY)
Admission: RE | Admit: 2018-07-22 | Discharge: 2018-07-22 | Disposition: A | Discharge status: Home / Self Care | Payer: Medicare Other | Source: Ambulatory Visit | Attending: Pulmonary Disease | Admitting: Pulmonary Disease

## 2018-07-22 DIAGNOSIS — J9611 Chronic respiratory failure with hypoxia: Secondary | ICD-10-CM

## 2018-07-22 NOTE — Progress Notes (Signed)
I have reviewed the pulmonary graduate packet with [name] . Cordella is scheduled to graduate on 07/24/18. The patient was given an exercise prescription and stated that they were going to join the pulmonary maintenance program at [facility].  Pamala Hurry and I discussed how to progress their exercise prescription.  The patient stated that they understand the exercise prescription.  We reviewed exercise guidelines, target heart rate during exercise, RPE Scale, weather conditions, NTG use, endpoints for exercise, warmup and cool down.  The patient was also given information regarding the pulmonary maintenance program and given homework to complete and return before graduation. Patient is encouraged to come to me with any questions.

## 2018-07-22 NOTE — Progress Notes (Signed)
Daily Session Note  Patient Details  Name: [name] MRN: [id number] Date of Birth: Dec 02, 1934 Referring Provider:     Pulmonary Rehab Walk Test from 04/10/2018 in [place]  Referring Provider  Dr. Elsworth Soho      Encounter Date: 07/22/2018  Check In:   Capillary Blood Glucose: No results found for this or any previous visit (from the past 24 hour(s)).    Social History   Tobacco Use  Smoking Status Former Smoker  . Packs/day: 1.00  . Years: 40.00  . Pack years: 40.00  . Types: Cigarettes  . Last attempt to quit: 03/20/2003  . Years since quitting: 15.3  Smokeless Tobacco Never Used    Goals Met:  Proper associated with RPD/PD & O2 Sat Exercise tolerated well  Goals Unmet:  Not Applicable  Comments: Service time is from 1330 to 1510    Dr. Rush Farmer is Medical Director for Pulmonary Rehab at [facility].

## 2018-07-24 ENCOUNTER — Encounter (HOSPITAL_COMMUNITY)
Admission: RE | Admit: 2018-07-24 | Discharge: 2018-07-24 | Disposition: A | Discharge status: Home / Self Care | Payer: Medicare Other | Source: Ambulatory Visit | Attending: Pulmonary Disease | Admitting: Pulmonary Disease

## 2018-07-24 DIAGNOSIS — J9611 Chronic respiratory failure with hypoxia: Secondary | ICD-10-CM | POA: Diagnosis not present

## 2018-07-25 DIAGNOSIS — H2000 Unspecified acute and subacute iridocyclitis: Secondary | ICD-10-CM | POA: Diagnosis not present

## 2018-08-15 ENCOUNTER — Ambulatory Visit (INDEPENDENT_AMBULATORY_CARE_PROVIDER_SITE_OTHER): Payer: Medicare Other | Admitting: Nurse Practitioner

## 2018-08-15 ENCOUNTER — Encounter: Payer: Self-pay | Admitting: Nurse Practitioner

## 2018-08-15 VITALS — BP 98/60 | HR 60 | Temp 98.0°F | Ht 65.5 in | Wt 154.2 lb

## 2018-08-15 DIAGNOSIS — J069 Acute upper respiratory infection, unspecified: Secondary | ICD-10-CM

## 2018-08-15 DIAGNOSIS — J029 Acute pharyngitis, unspecified: Secondary | ICD-10-CM

## 2018-08-15 DIAGNOSIS — H2000 Unspecified acute and subacute iridocyclitis: Secondary | ICD-10-CM | POA: Diagnosis not present

## 2018-08-15 DIAGNOSIS — J9611 Chronic respiratory failure with hypoxia: Secondary | ICD-10-CM

## 2018-08-15 DIAGNOSIS — J449 Chronic obstructive pulmonary disease, unspecified: Secondary | ICD-10-CM

## 2018-08-15 LAB — STREP COMPLETE PANEL
STREP DYSGALACTIAE: NEGATIVE
STREP PYOGENES: NEGATIVE

## 2018-08-15 MED ORDER — NYSTATIN 100000 UNIT/ML MT SUSP: 5.0000 mL | Freq: Four times a day (QID) | OROMUCOSAL | 0 refills | Status: DC

## 2018-08-15 MED ORDER — PREDNISONE 10 MG PO TABS: ORAL_TABLET | ORAL | 0 refills | Status: DC

## 2018-08-15 MED ORDER — CEFDINIR 300 MG PO CAPS: 300.0000 mg | ORAL_CAPSULE | Freq: Two times a day (BID) | ORAL | 0 refills | Status: DC

## 2018-08-15 NOTE — Assessment & Plan Note (Signed)
Continue O2 at 2 L [place]

## 2018-08-15 NOTE — Assessment & Plan Note (Signed)
Will treat for URI. Concerned for strep throat - did appear to have exudate. Could be thrush - will also treat with Nystatin mouth wash.   Patient Instructions  Will order strep swab/culture Will order omnicef Will order prednisone taper Will order nystatin swish and spit - throat irritation May take mucinex  May take delsym for cough Follow up with Dr. Elsworth Soho in 2 months or sooner if symptoms fail to improve

## 2018-08-15 NOTE — Patient Instructions (Addendum)
Will order strep swab/culture Will order omnicef Will order prednisone taper Will order nystatin swish and spit - throat irritation May take mucinex  May take delsym for cough Follow up with Dr. Elsworth Soho in 2 months or sooner if symptoms fail to improve

## 2018-08-15 NOTE — Progress Notes (Signed)
_0  ID: [name], female    DOB: 10/21/1934, 83 y.o.   MRN: [id number]  Chief Complaint  Patient presents with  . Acute Visit    Referring provider: Janith Lima, MD  HPI 83 year old female former smoker seen for pulmonary consult June 2019 for COPD and chronic respiratory failure on oxygen.  She is a patient of Dr. Elsworth Soho. Past medical history significant for congestive heart failure, A. fib , glaucoma, breast cancer status post resection, recurrent bladder cancer status post TURP/chemo Previous history of intracranial bleed status post craniotomy and 2016  TEST /Events  Admitted April 2019 for COPD exacerbation Spirometry June 2019 showed severe COPD with FEV1 at 137%, ratio 48, FVC 57%.  O2 saturations at rest on room air 87%. Chest x-ray April 2019 consistent with COPD and chronic bronchitic changes. Pneumovax 2018, Prevnar 13 2015  OV 08/15/18 - acute cough Patient presents for cough and chest congestion.  She states that symptoms started this past Wednesday (08/06/18).  She states that her cough is nonproductive.  She states that symptoms are progressively worsened. She also complains of a sore throat.  She is on 2 L nasal cannula continuous.  Is compliant with her medications. She is currently on Brovana and Pulmicort.  She has Proventil ordered as needed but states that she has not had to use it. She does denies any sinus pressure, pain, or congestion.  She denies any fever, chest pain, or edema.     Allergies  Allergen Reactions  . Heparin     Previous history of hemorrhagic stroke.  Marland Kitchen Morphine And Related Nausea And Vomiting    Immunization History  Administered Date(s) Administered  . Influenza, High Dose Seasonal PF 06/08/2016, 05/13/2017, 06/10/2018  . Influenza-Unspecified 07/05/2015  . Pneumococcal Conjugate-13 05/20/2014  . Pneumococcal Polysaccharide-23 10/09/2011, 06/17/2017  . Tdap 09/03/2005, 11/09/2015  . Zoster 09/04/2007    Past  Medical History:  Diagnosis Date  . Arthritis   . COPD (chronic obstructive pulmonary disease) ([place])   . Dyspnea on exertion   . GERD (gastroesophageal reflux disease)   . Glaucoma of both eyes   . History of atrial fibrillation without current medication 2010   POST SURG 2010--  PER PT NO ISSUES SINCE  . History of breast cancer    DX DCIS IN 2004--  S/P RIGHT MASTECTOMY , NO CHEMORADIATION---  NO RECURRENCE  . History of CHF (congestive heart failure)    POST SURG 2010  . History of hypertension   . History of shingles    02/ 2015--  back of neck and left flank--  no residual pain  . ICH (intracerebral hemorrhage) ([place]) 02/05/2015  . Nephrolithiasis   . Recurrent bladder papillary carcinoma ([place]) first dx 06/ 2014   s/p  turbt's  and instillation mitomycin c (chemo)//dx again in 2015-different type    Tobacco History: Social History   Tobacco Use  Smoking Status Former Smoker  . Packs/day: 1.00  . Years: 40.00  . Pack years: 40.00  . Types: Cigarettes  . Last attempt to quit: 03/20/2003  . Years since quitting: 15.4  Smokeless Tobacco Never Used   Counseling given: Yes   Outpatient Encounter Medications as of 08/15/2018  Medication Sig  . arformoterol (BROVANA) 15 MCG/2ML NEBU Take 2 mLs (15 mcg total) by nebulization 2 (two) times daily.  Marland Kitchen atorvastatin (LIPITOR) 10 MG tablet TAKE ONE TABLET DAILY AT 6PM  . budesonide (PULMICORT) 0.5 MG/2ML nebulizer solution Take 2 mLs (0.5 mg total)  by nebulization 2 (two) times daily.  Marland Kitchen DIGOX 125 MCG tablet TAKE ONE TABLET DAILY  . diltiazem (CARDIZEM CD) 240 MG 24 hr capsule TAKE ONE CAPSULE EACH DAY  . latanoprost (XALATAN) 0.005 % ophthalmic solution Place 1 drop into both eyes at bedtime. Reported on 11/09/2015  . pantoprazole (PROTONIX) 40 MG tablet TAKE ONE TABLET DAILY  . SIMBRINZA 1-0.2 % SUSP Place 1 drop into both eyes 2 (two) times daily.   Marland Kitchen albuterol (PROVENTIL HFA;VENTOLIN HFA) 108 (90 Base) MCG/ACT inhaler Inhale 2  puffs into the lungs every 6 (six) hours as needed for wheezing or shortness of breath. (Patient not taking: Reported on 08/15/2018)  . cefdinir (OMNICEF) 300 MG capsule Take 1 capsule (300 mg total) by mouth 2 (two) times daily.  Marland Kitchen nystatin (MYCOSTATIN) 100000 UNIT/ML suspension Take 5 mLs (500,000 Units total) by mouth 4 (four) times daily.  . predniSONE (DELTASONE) 10 MG tablet Take 3 tabs for 2 days, then 2 tabs for 2 days, then 1 tab for 2 days, then stop  . [DISCONTINUED] cefdinir (OMNICEF) 300 MG capsule Take 1 capsule (300 mg total) by mouth 2 (two) times daily.   No facility-administered encounter medications on file as of 08/15/2018.      Review of Systems  Review of Systems  Constitutional: Negative.  Negative for chills and fever.  HENT: Positive for sore throat.   Respiratory: Positive for cough. Negative for shortness of breath and wheezing.   Cardiovascular: Negative.  Negative for chest pain, palpitations and leg swelling.  Gastrointestinal: Negative.   Allergic/Immunologic: Negative.   Neurological: Negative.   Psychiatric/Behavioral: Negative.        Physical Exam  BP 98/60 (BP Location: Left Arm, Cuff Size: Normal)   Pulse 60   Temp 98 F (36.7 C) (Oral)   Ht 5' 5.5" (1.664 m)   Wt 154 lb 3.2 oz (69.9 kg)   LMP 12/03/2002   SpO2 96%   BMI 25.27 kg/m   Wt Readings from Last 5 Encounters:  08/15/18 154 lb 3.2 oz (69.9 kg)  07/15/18 156 lb 1.4 oz (70.8 kg)  07/01/18 156 lb 4.9 oz (70.9 kg)  06/17/18 157 lb 13.6 oz (71.6 kg)  06/03/18 157 lb 6.5 oz (71.4 kg)     Physical Exam Vitals signs and nursing note reviewed.  Constitutional:      General: She is not in acute distress.    Appearance: She is well-developed.  HENT:     Mouth/Throat:     Pharynx: Oropharyngeal exudate and posterior oropharyngeal erythema present.  Cardiovascular:     Rate and Rhythm: Normal rate and regular rhythm.  Pulmonary:     Effort: Pulmonary effort is normal. No  respiratory distress.     Breath sounds: Normal breath sounds. No wheezing, rhonchi or rales.  Musculoskeletal:        General: No swelling.  Neurological:     Mental Status: She is alert and oriented to person, place, and time.        Assessment & Plan:   Chronic respiratory failure with hypoxia (HCC) Continue O2 at 2 L [place]  Upper respiratory tract infection Will treat for URI. Concerned for strep throat - did appear to have exudate. Could be thrush - will also treat with Nystatin mouth wash.   Patient Instructions  Will order strep swab/culture Will order omnicef Will order prednisone taper Will order nystatin swish and spit - throat irritation May take mucinex  May take delsym for cough Follow  up with Dr. Elsworth Soho in 2 months or sooner if symptoms fail to improve       Fenton Foy, NP 08/15/2018

## 2018-08-18 DIAGNOSIS — Z1231 Encounter for screening mammogram for malignant neoplasm of breast: Secondary | ICD-10-CM | POA: Diagnosis not present

## 2018-08-18 LAB — HM MAMMOGRAPHY

## 2018-08-19 ENCOUNTER — Other Ambulatory Visit: Payer: Self-pay | Admitting: Internal Medicine

## 2018-08-19 DIAGNOSIS — J418 Mixed simple and mucopurulent chronic bronchitis: Secondary | ICD-10-CM

## 2018-08-21 ENCOUNTER — Telehealth: Payer: Self-pay | Admitting: Pulmonary Disease

## 2018-08-21 DIAGNOSIS — J449 Chronic obstructive pulmonary disease, unspecified: Secondary | ICD-10-CM

## 2018-08-21 NOTE — Telephone Encounter (Signed)
Unable to contact pulmonary rehab, due to it being after hours.  Will call back on 08/22/18.

## 2018-08-22 ENCOUNTER — Ambulatory Visit (INDEPENDENT_AMBULATORY_CARE_PROVIDER_SITE_OTHER)
Admission: RE | Admit: 2018-08-22 | Discharge: 2018-08-22 | Disposition: A | Discharge status: Home / Self Care | Payer: Medicare Other | Source: Ambulatory Visit | Attending: Primary Care | Admitting: Primary Care

## 2018-08-22 ENCOUNTER — Encounter: Payer: Self-pay | Admitting: Primary Care

## 2018-08-22 ENCOUNTER — Ambulatory Visit (INDEPENDENT_AMBULATORY_CARE_PROVIDER_SITE_OTHER): Payer: Medicare Other | Admitting: Primary Care

## 2018-08-22 VITALS — BP 124/80 | HR 98 | Temp 98.3°F | Ht 65.5 in | Wt 150.4 lb

## 2018-08-22 DIAGNOSIS — I5042 Chronic combined systolic (congestive) and diastolic (congestive) heart failure: Secondary | ICD-10-CM

## 2018-08-22 DIAGNOSIS — J441 Chronic obstructive pulmonary disease with (acute) exacerbation: Secondary | ICD-10-CM

## 2018-08-22 DIAGNOSIS — J90 Pleural effusion, not elsewhere classified: Secondary | ICD-10-CM | POA: Diagnosis not present

## 2018-08-22 DIAGNOSIS — J9611 Chronic respiratory failure with hypoxia: Secondary | ICD-10-CM

## 2018-08-22 DIAGNOSIS — J439 Emphysema, unspecified: Secondary | ICD-10-CM | POA: Diagnosis not present

## 2018-08-22 LAB — BASIC METABOLIC PANEL
BUN: 21 mg/dL (ref 6–23)
CO2: 31 mEq/L (ref 19–32)
Calcium: 8.9 mg/dL (ref 8.4–10.5)
Chloride: 99 mEq/L (ref 96–112)
Creatinine, Ser: 0.84 mg/dL (ref 0.40–1.20)
GFR: 69.14 mL/min (ref >60.00)
Glucose, Bld: 93 mg/dL (ref 70–99)
Potassium: 3.8 mEq/L (ref 3.5–5.1)
Sodium: 139 mEq/L (ref 135–145)

## 2018-08-22 LAB — CBC
HCT: 46.9 % — ABNORMAL HIGH (ref 36.0–46.0)
Hemoglobin: 15.4 g/dL — ABNORMAL HIGH (ref 12.0–15.0)
MCHC: 32.9 g/dL (ref 30.0–36.0)
MCV: 95.9 fl (ref 78.0–100.0)
Platelets: 226 10*3/uL (ref 150.0–400.0)
RBC: 4.89 Mil/uL (ref 3.87–5.11)
RDW: 17.5 % — ABNORMAL HIGH (ref 11.5–15.5)
WBC: 11.7 10*3/uL — ABNORMAL HIGH (ref 4.0–10.5)

## 2018-08-22 LAB — D-DIMER, QUANTITATIVE: D-Dimer, Quant: 0.41 mcg/mL FEU (ref <0.50)

## 2018-08-22 LAB — BRAIN NATRIURETIC PEPTIDE: Pro B Natriuretic peptide (BNP): 483 pg/mL — ABNORMAL HIGH (ref 0.0–100.0)

## 2018-08-22 MED ORDER — METHYLPREDNISOLONE ACETATE 80 MG/ML IJ SUSP
80.0000 mg | Freq: Once | INTRAMUSCULAR | Status: AC
  Administered 2018-08-22: 80 mg via INTRAMUSCULAR

## 2018-08-22 MED ORDER — PREDNISONE 10 MG PO TABS: ORAL_TABLET | ORAL | 0 refills | Status: DC

## 2018-08-22 MED ORDER — FUROSEMIDE 20 MG PO TABS: ORAL_TABLET | ORAL | 0 refills | Status: DC

## 2018-08-22 MED ORDER — LEVALBUTEROL HCL 0.63 MG/3ML IN NEBU
0.6300 mg | INHALATION_SOLUTION | RESPIRATORY_TRACT | Status: DC
  Administered 2018-08-22: 0.63 mg via RESPIRATORY_TRACT

## 2018-08-22 MED ORDER — DOXYCYCLINE HYCLATE 100 MG PO TABS: 100.0000 mg | ORAL_TABLET | Freq: Two times a day (BID) | ORAL | 0 refills | Status: DC

## 2018-08-22 MED ORDER — IPRATROPIUM BROMIDE 0.02 % IN SOLN: 0.5000 mg | Freq: Four times a day (QID) | RESPIRATORY_TRACT | 12 refills | Status: DC

## 2018-08-22 MED ORDER — LEVALBUTEROL HCL 0.63 MG/3ML IN NEBU
0.6300 mg | INHALATION_SOLUTION | Freq: Once | RESPIRATORY_TRACT | Status: AC
  Administered 2018-08-22: 0.63 mg via RESPIRATORY_TRACT

## 2018-08-22 NOTE — Progress Notes (Signed)
_0  ID: [name], female    DOB: [birth year], 83 y.o.   MRN: [id number]  Chief Complaint  Patient presents with  . Acute Visit    SOB all the time but worse with exertion, cough with clear to yellow tinged mucus, hurts by left breast when coughing    Referring provider: Janith Lima, MD  HPI: 83 year old female, former smoker quit in 2004 (40 pack year hx). PMH significant for COPD, chronic respiratory failure with hypoxia (2L oxygen), HTN, afib (not anticoagulated), chronic combined systolic and diastolic congestive heart failure, breast cancer s/p resection, recurrent bladder cancer s/p TURP/chemo. Patient of Dr. Elsworth Soho, seen for initial consult on 02/18/18. Admitted in April 2019 for COPD exacerbation. Most recently seen by pulmonary NP on 08/05/18 for cough, chest congestion and sore throat symptoms treated with omnicef and prednisone taper. Maintained on Pulmicort and Brovana nebulizer twice daily. Up today with Pneumovax and Prevnar.   08/22/2018 Patient presents today for acute visit with complaints of ongoing shortness of breath and np cough. Experiences sob all the time but mostly with exertion. She is chronically on 2L oxygen, O2 sat today 94%. Cough is mostly dry, occassionally she gets some clear/yellow mucus up. Having left chest wall chest pain with cough, states that it feels muscular in nature. States that she cough so much the last two nights. Associated weakness. Completed omnicef course and prednisone. Felt better for 1 day. Not currently taking anything additional for her cough. No fever.   Testing:  06/19 Spirometry - FVC 57%, FEV1 137%, ratio 48  04/19 CXR - COPD and chronic bronchitic changes   Allergies  Allergen Reactions  . Heparin     Previous history of hemorrhagic stroke.  Marland Kitchen Morphine And Related Nausea And Vomiting    Immunization History  Administered Date(s) Administered  . Influenza, High Dose Seasonal PF 06/08/2016, 05/13/2017,  06/10/2018  . Influenza-Unspecified 07/05/2015  . Pneumococcal Conjugate-13 05/20/2014  . Pneumococcal Polysaccharide-23 10/09/2011, 06/17/2017  . Tdap 09/03/2005, 11/09/2015  . Zoster 09/04/2007    Past Medical History:  Diagnosis Date  . Arthritis   . COPD (chronic obstructive pulmonary disease) ([place])   . Dyspnea on exertion   . GERD (gastroesophageal reflux disease)   . Glaucoma of both eyes   . History of atrial fibrillation without current medication 2010   POST SURG 2010--  PER PT NO ISSUES SINCE  . History of breast cancer    DX DCIS IN 2004--  S/P RIGHT MASTECTOMY , NO CHEMORADIATION---  NO RECURRENCE  . History of CHF (congestive heart failure)    POST SURG 2010  . History of hypertension   . History of shingles    02/ 2015--  back of neck and left flank--  no residual pain  . ICH (intracerebral hemorrhage) ([place]) 02/05/2015  . Nephrolithiasis   . Recurrent bladder papillary carcinoma ([place]) first dx 06/ 2014   s/p  turbt's  and instillation mitomycin c (chemo)//dx again in 2015-different type    Tobacco History: Social History   Tobacco Use  Smoking Status Former Smoker  . Packs/day: 1.00  . Years: 40.00  . Pack years: 40.00  . Types: Cigarettes  . Last attempt to quit: 03/20/2003  . Years since quitting: 15.4  Smokeless Tobacco Never Used   Counseling given: Not Answered   Outpatient Medications Prior to Visit  Medication Sig Dispense Refill  . albuterol (PROVENTIL HFA;VENTOLIN HFA) 108 (90 Base) MCG/ACT inhaler Inhale 2 puffs into the lungs  every 6 (six) hours as needed for wheezing or shortness of breath. 1 Inhaler 2  . arformoterol (BROVANA) 15 MCG/2ML NEBU Take 2 mLs (15 mcg total) by nebulization 2 (two) times daily. 120 mL 5  . atorvastatin (LIPITOR) 10 MG tablet TAKE ONE TABLET DAILY AT 6PM 60 tablet 1  . budesonide (PULMICORT) 0.5 MG/2ML nebulizer solution 2ML BY NEBULIZER TWICE DAILY 120 mL 5  . DIGOX 125 MCG tablet TAKE ONE TABLET DAILY 90 tablet 0   . diltiazem (CARDIZEM CD) 240 MG 24 hr capsule TAKE ONE CAPSULE EACH DAY 90 capsule 0  . latanoprost (XALATAN) 0.005 % ophthalmic solution Place 1 drop into both eyes at bedtime. Reported on 11/09/2015    . pantoprazole (PROTONIX) 40 MG tablet TAKE ONE TABLET DAILY 90 tablet 1  . SIMBRINZA 1-0.2 % SUSP Place 1 drop into both eyes 2 (two) times daily.   99  . cefdinir (OMNICEF) 300 MG capsule Take 1 capsule (300 mg total) by mouth 2 (two) times daily. 14 capsule 0  . nystatin (MYCOSTATIN) 100000 UNIT/ML suspension Take 5 mLs (500,000 Units total) by mouth 4 (four) times daily. 60 mL 0  . predniSONE (DELTASONE) 10 MG tablet Take 3 tabs for 2 days, then 2 tabs for 2 days, then 1 tab for 2 days, then stop 12 tablet 0   No facility-administered medications prior to visit.     Review of Systems  Review of Systems  Constitutional: Negative.   HENT: Positive for congestion.   Respiratory: Positive for cough and shortness of breath. Negative for chest tightness and wheezing.   Cardiovascular: Negative.     Physical Exam  BP 124/80 (BP Location: Left Arm, Cuff Size: Normal)   Pulse 98   Temp 98.3 F (36.8 C)   Ht 5' 5.5" (1.664 m)   Wt 150 lb 6.4 oz (68.2 kg)   LMP 12/03/2002   SpO2 94%   BMI 24.65 kg/m  Physical Exam Constitutional:      Appearance: Normal appearance.  HENT:     Head: Normocephalic and atraumatic.     Nose: Nose normal.     Mouth/Throat:     Mouth: Mucous membranes are moist.     Pharynx: Oropharynx is clear.  Eyes:     Extraocular Movements: Extraocular movements intact.     Pupils: Pupils are equal, round, and reactive to light.  Neck:     Musculoskeletal: Normal range of motion and neck supple.  Cardiovascular:     Rate and Rhythm: Normal rate and regular rhythm.     Comments: No edema Pulmonary:     Effort: No respiratory distress.     Breath sounds: Wheezing and rhonchi present. No rales.     Comments: Coarse wheeze left base  Musculoskeletal: Normal  range of motion.  Skin:    General: Skin is warm and dry.  Neurological:     General: No focal deficit present.     Mental Status: She is alert and oriented to person, place, and time. Mental status is at baseline.  Psychiatric:        Mood and Affect: Mood normal.        Behavior: Behavior normal.        Thought Content: Thought content normal.        Judgment: Judgment normal.      Lab Results:  CBC    Component Value Date/Time   WBC 11.7 (H) 08/22/2018 1507   RBC 4.89 08/22/2018 1507   HGB  15.4 (H) 08/22/2018 1507   HCT 46.9 (H) 08/22/2018 1507   PLT 226.0 08/22/2018 1507   MCV 95.9 08/22/2018 1507   MCH 30.1 12/27/2017 0217   MCHC 32.9 08/22/2018 1507   RDW 17.5 (H) 08/22/2018 1507   LYMPHSABS 2.4 02/23/2015 0751   MONOABS 0.5 02/23/2015 0751   EOSABS 0.3 02/23/2015 0751   BASOSABS 0.1 02/23/2015 0751    BMET    Component Value Date/Time   NA 139 08/22/2018 1507   NA 143 04/19/2017 0936   K 3.8 08/22/2018 1507   CL 99 08/22/2018 1507   CO2 31 08/22/2018 1507   GLUCOSE 93 08/22/2018 1507   BUN 21 08/22/2018 1507   BUN 20 04/19/2017 0936   CREATININE 0.84 08/22/2018 1507   CREATININE 0.69 07/11/2015 0910   CALCIUM 8.9 08/22/2018 1507   GFRNONAA 58 (L) 12/28/2017 0536   GFRAA >60 12/28/2017 0536    BNP    Component Value Date/Time   BNP 506.5 (H) 12/27/2017 0931    ProBNP    Component Value Date/Time   PROBNP 483.0 (H) 08/22/2018 1507    Imaging: Dg Chest 2 View  Result Date: 08/22/2018 CLINICAL DATA:  83 year old female with a history of worsening dyspnea EXAM: CHEST - 2 VIEW COMPARISON:  12/26/2017 FINDINGS: Cardiomediastinal silhouette similar in size and contour. No evidence of central vascular congestion. No interlobular septal thickening. Compared to the prior plain film there is resolution of the pleural fluid on the left. Persistent blunting of the right costophrenic angle compared to the prior. No confluent airspace disease. Linear opacity  overlying the left heart border has been present on multiple prior plain films and is unchanged. Stigmata of emphysema, with increased retrosternal airspace, flattened hemidiaphragms, increased AP diameter, and hyperinflation on the AP view. Degenerative changes of the spine. IMPRESSION: Interval resolution of left-sided pleural effusion, with unchanged appearance of either residual trace right pleural effusion and/or scarring. Emphysema and chronic changes with no evidence of lobar pneumonia. Degenerative changes of the spine. Electronically Signed   By: Corrie Mckusick D.O.   On: 08/22/2018 16:04     Assessment & Plan:  83 year old female with past medical history significant for COPD, chronic respiratory failure and chronic combined diastolic and systolic CHF.  She was seen last week in pulmonary office for cough and chest congestion, given a course of Omnicef and prednisone with mild improvement for 1 day.  Presents today 1 week later with continued complaints of shortness of breath and chest congestion. Experiences shortness of breath all the time but worse on exertion. She continues Pulmicort and Brovana nebulizers twice a day as prescribed. States that she rarely will miss a treatment. CXR showed no evidence of PNA, resolution of left-sided pleural effusion, unchanged appearance of either residual trace right pleural effusion and/or scarring. D/t wheezing and rhonchi to left base will treat for acute copd exacerbation with doxycyline and prednisone. Received xopenex nebulizer and depo-medrol 65m IM today. BNP came back elevated at 483,  dyspnea and cough could be related to acute CHF exacerbation as well. Review labs and CXR with patient, she is agreeing to lasix and would like an abx sent. D-dimer pending, if elevated would order CTA. Follow up in 7-10 days.  COPD (chronic obstructive pulmonary disease) (HCC) - Acute exacerbation, tx doxycyline 1 tab BID x 7 days and prednisone taper  - Continue  Pulmicort and Brovana nebulizer twice a day  - Add Ipratropium 0.578mQID   Chronic respiratory [place] with [place]  O2 sat 94% 2L today - Increased dyspnea  - Check labs and CXR today r/o PE, PNA or pleural effusion   Chronic combined systolic and diastolic congestive heart failure - Pro-BNP 483 - Not currently on diuretic - Plan lasix 80m x 5 days    EMartyn Ehrich NP 08/22/2018

## 2018-08-22 NOTE — Assessment & Plan Note (Addendum)
-  Pro-BNP 483 - Not currently on diuretic - Plan lasix 12m x 5 days

## 2018-08-22 NOTE — Telephone Encounter (Signed)
Called Pulmonary Rehab.  [name], is only available Tues, Weds, Thurs.  Was told that she was the only one to talk with about maintenance orders.    Will hold until Tues, Thurs. Next week

## 2018-08-22 NOTE — Assessment & Plan Note (Addendum)
-  O2 sat 94% 2L today - Increased dyspnea  - Check labs and CXR today r/o PE, PNA or pleural effusion

## 2018-08-22 NOTE — Patient Instructions (Addendum)
  RX: Ipatroprium 3-4 times a day scheduled   Office treatments: Xopenex neb x 1 today Depo-medrol 24m IM injection x 1  Orders: Labs and CXR today  Recommend: Mucinex twice a day Delsym cough syrup 5-128mevery 12 hours   Follow-up in 1-2 weeks with BeEustaquio MaizeP

## 2018-08-22 NOTE — Assessment & Plan Note (Addendum)
-  Acute exacerbation, tx doxycyline 1 tab BID x 7 days and prednisone taper  - Continue Pulmicort and Brovana nebulizer twice a day  - Add Ipratropium 0.57m QID

## 2018-08-25 NOTE — Telephone Encounter (Signed)
[name], Pulmonary Rehab, not available today.  Only available M,Tues,and Thurs.

## 2018-08-26 NOTE — Telephone Encounter (Signed)
Attempted to call patient today regarding pulm maintance. I did not receive an answer at time of call. I have left a voicemail message for pt to return call. X1

## 2018-08-26 NOTE — Progress Notes (Signed)
Spoke with pt and notified of results per [name].  Pt verbalized understanding and denied any questions. 

## 2018-08-26 NOTE — Telephone Encounter (Signed)
Pt is calling back [phone]

## 2018-08-28 NOTE — Telephone Encounter (Signed)
That's fine, thank you

## 2018-08-28 NOTE — Telephone Encounter (Signed)
Called and spoke with Rodney Langton at pulmonary rehab at phone [phone] this morning regarding pt has now graduated from pulmonary rehab. Pt is requesting to con't with pulmonary maintenance. Lattie Haw states that RA or NP will need to sign for order 1023 for the pulmonary maintenance if they agree with this request.   Guy Franco that RA is out of the office until 09/15/2018 Pt was last seen on 08/22/18 by Ilda Foil, will route message to her and RA to advise  Beth, please advise if order for pulmonary maintenance for patient to the pulmonary rehab to complete. Thank you.

## 2018-08-28 NOTE — Telephone Encounter (Signed)
Placed order today for pulmonary maintenance. Nothing further needed at this time.

## 2018-08-31 NOTE — Progress Notes (Signed)
Discharge Progress Report  Patient Details  Name: [name] MRN: [id number] Date of Birth: 1935/06/20 Referring Provider:     Pulmonary Rehab Walk Test from 04/10/2018 in [place]  Referring Provider  Dr. Elsworth Soho       Number of Visits: 22 exercise sessions and 9 education classes  Reason for Discharge:  Patient reached a stable level of exercise. Patient independent in their exercise. Patient has met program and personal goals.  Smoking History:  Social History   Tobacco Use  Smoking Status Former Smoker  . Packs/day: 1.00  . Years: 40.00  . Pack years: 40.00  . Types: Cigarettes  . Last attempt to quit: 03/20/2003  . Years since quitting: 15.4  Smokeless Tobacco Never Used    Diagnosis:  Chronic respiratory failure with hypoxia ([place])  ADL [facility]: Pulmonary Assessment Scores    Row Name 04/11/18 1003 07/24/18 1617       ADL [facility]   ADL Phase  Entry  Exit    SOB Score total  -  35      CAT Score   CAT Score  -  11      mMRC Score   mMRC Score  2  -       Initial Exercise Prescription: Initial Exercise Prescription - 04/11/18 1000      Date of Initial Exercise RX and Referring Provider   Date  04/11/18    Referring Provider  Dr. Elsworth Soho      Oxygen   Oxygen  Continuous    Liters  3      Recumbant Bike   Level  1    RPM  10    Minutes  17      NuStep   Level  1    SPM  80    Minutes  17    METs  1.3      Prescription Details   Frequency (times per week)  2    Duration  Progress to 45 minutes of aerobic exercise without signs/symptoms of physical distress      Intensity   THRR 40-80% of Max Heartrate  56-112    Ratings of Perceived Exertion  11-13    Perceived Dyspnea  0-4      Progression   Progression  Continue progressive overload as per policy without signs/symptoms or physical distress.      Resistance Training   Training Prescription  Yes    Weight  orange bands    Reps  10-15        Discharge Exercise Prescription (Final Exercise Prescription Changes): Exercise Prescription Changes - 07/15/18 1600      Response to Exercise   Blood Pressure (Admit)  136/82    Blood Pressure (Exercise)  130/80    Blood Pressure (Exit)  120/78    Heart Rate (Admit)  95 bpm    Heart Rate (Exercise)  97 bpm    Heart Rate (Exit)  88 bpm    Oxygen Saturation (Admit)  96 %    Oxygen Saturation (Exercise)  94 %    Oxygen Saturation (Exit)  100 %    Rating of Perceived Exertion (Exercise)  12    Perceived Dyspnea (Exercise)  2    Duration  Progress to 45 minutes of aerobic exercise without signs/symptoms of physical distress    Intensity  THRR unchanged      Progression   Progression  Continue to progress workloads to maintain intensity without  signs/symptoms of physical distress.      Resistance Training   Training Prescription  Yes    Weight  orange bands    Reps  10-15    Time  10 Minutes      Oxygen   Oxygen  Continuous    Liters  2      NuStep   Level  2    SPM  80    Minutes  34    METs  1.9      Track   Laps  6    Minutes  17       Functional Capacity: 6 Minute Walk    Row Name 04/11/18 0958 07/24/18 1621       6 Minute Walk   Phase  Initial  Discharge    Distance  700 feet  926 feet    Distance Feet Change  -  226 ft    Walk Time  6 minutes  6 minutes    # of Rest Breaks  - 1 standing rest break for 30 seconds  0    MPH  1.33  1.75    METS  2  2.38    RPE  12  13    Perceived Dyspnea   2  2    Symptoms  -  Yes (comment)    Comments  used wheelchair/ used forehead probe/blurry vision during 28mt which resided/lost balance around corners due to stroke  used wheelchair     Resting HR  68 bpm  78 bpm    Resting BP  104/70  114/60    Resting Oxygen Saturation   92 %  96 %    Exercise Oxygen Saturation  during 6 min walk  88 %  82 %    Max Ex. HR  89 bpm  105 bpm    Max Ex. BP  130/64  140/68    2 Minute Post BP  -  118/62      Interval HR   1  Minute HR  70  88    2 Minute HR  78  95    3 Minute HR  73  101    4 Minute HR  89  102    5 Minute HR  82  105    6 Minute HR  67  100    2 Minute Post HR  -  75    Interval Heart Rate?  Yes  Yes      Interval Oxygen   Interval Oxygen?  Yes  Yes    Baseline Oxygen Saturation %  92 %  96 %    1 Minute Oxygen Saturation %  88 %  91 %    1 Minute Liters of Oxygen  2 L  2 L    2 Minute Oxygen Saturation %  94 %  91 %    2 Minute Liters of Oxygen  3 L  2 L    3 Minute Oxygen Saturation %  92 %  82 %    3 Minute Liters of Oxygen  3 L  2 L    4 Minute Oxygen Saturation %  93 %  91 %    4 Minute Liters of Oxygen  3 L  3 L    5 Minute Oxygen Saturation %  96 %  90 %    5 Minute Liters of Oxygen  3 L  3 L    6 Minute Oxygen Saturation %  88 %  89 %    6 Minute Liters of Oxygen  3 L  3 L    2 Minute Post Oxygen Saturation %  -  96 %    2 Minute Post Liters of Oxygen  -  2 L       Psychological, QOL, Others - Outcomes: PHQ 2/9: Depression screen [facility] 2/9 07/24/2018 04/09/2018 12/27/2017 11/12/2015 05/20/2015  Decreased Interest 0 0 0 0 0  Down, Depressed, Hopeless 0 0 0 0 0  PHQ - 2 Score 0 0 0 0 0  Altered sleeping 0 0 - - -  Tired, decreased energy 0 1 - - -  Change in appetite 0 0 - - -  Feeling bad or failure about yourself  0 0 - - -  Trouble concentrating 0 0 - - -  Moving slowly or fidgety/restless 0 0 - - -  Suicidal thoughts 0 0 - - -  PHQ-9 Score 0 1 - - -  Difficult doing work/chores Not difficult at all Not difficult at all - - -    Quality of Life:   Personal Goals: Goals established at orientation with interventions provided to work toward goal. Personal Goals and Risk Factors at Admission - 04/09/18 1436      Core Components/Risk Factors/Patient Goals on Admission    Weight Management  Yes;Weight Loss    Intervention  Weight Management: Develop a combined nutrition and exercise program designed to reach desired caloric intake, while maintaining appropriate intake  of nutrient and fiber, sodium and fats, and appropriate energy expenditure required for the weight goal.;Weight Management: Provide education and appropriate resources to help participant work on and attain dietary goals.;Weight Management/Obesity: Establish reasonable short term and long term weight goals.    Admit Weight  157 lb 13.6 oz (71.6 kg)    Goal Weight: Short Term  153 lb (69.4 kg)    Goal Weight: Long Term  150 lb (68 kg)    Expected Outcomes  Short Term: Continue to assess and modify interventions until short term weight is achieved;Long Term: Adherence to nutrition and physical activity/exercise program aimed toward attainment of established weight goal;Weight Loss: Understanding of general recommendations for a balanced deficit meal plan, which promotes 1-2 lb weight loss per week and includes a negative energy balance of [phone] kcal/d;Understanding recommendations for meals to include 15-35% energy as protein, 25-35% energy from fat, 35-60% energy from carbohydrates, less than 271m of dietary cholesterol, 20-35 gm of total fiber daily;Understanding of distribution of calorie intake throughout the day with the consumption of 4-5 meals/snacks    Improve shortness of breath with ADL's  Yes    Intervention  Provide education, individualized exercise plan and daily activity instruction to help decrease symptoms of SOB with activities of daily living.    Expected Outcomes  Short Term: Improve cardiorespiratory fitness to achieve a reduction of symptoms when performing ADLs;Long Term: Be able to perform more ADLs without symptoms or delay the onset of symptoms        Personal Goals Discharge: Goals and Risk Factor Review    Row Name 04/23/18 1202 05/22/18 1322 06/18/18 1940 07/16/18 1235 07/24/18 1518     Core Components/Risk Factors/Patient Goals Review   Personal Goals Review  Weight Management/Obesity;Improve shortness of breath with ADL's;Increase knowledge of respiratory medications  and ability to use respiratory devices properly.;Develop more efficient breathing techniques such as purse lipped breathing and diaphragmatic breathing and practicing self-pacing with activity.  Weight Management/Obesity;Improve shortness of breath  with ADL's;Increase knowledge of respiratory medications and ability to use respiratory devices properly.;Develop more efficient breathing techniques such as purse lipped breathing and diaphragmatic breathing and practicing self-pacing with activity.  Weight Management/Obesity;Improve shortness of breath with ADL's;Increase knowledge of respiratory medications and ability to use respiratory devices properly.;Develop more efficient breathing techniques such as purse lipped breathing and diaphragmatic breathing and practicing self-pacing with activity.  Weight Management/Obesity;Improve shortness of breath with ADL's;Increase knowledge of respiratory medications and ability to use respiratory devices properly.;Develop more efficient breathing techniques such as purse lipped breathing and diaphragmatic breathing and practicing self-pacing with activity.  -   Review  Pt has completed 1 exercise session on 8/20.  Unable to adequately assess at this time pt progress toward her patient goals  Pt has completed 7 exercise sessions, Pt has attended the respiratory medication class. Pt with weight loss of .7 kg since 8/22.  Pt with increase to level 2 on the nustep and averages 10 laps in 15 minutes.  Pt rates her dyspnea level 2.  I anticipate with consistent attendance, pt will show measurable progress in the next 30 day assessment.  Pt has completed 13 exercise sessions and 5 education classes.  Pt had brief absence for minor bladder procedure.  Pt has history of bladder CA,  Pt with weight loss of .8 kg since 8/22.  Pt maintained level 2 on the nustep and averages 10 laps in 15 minutes.  Pt rates her dyspnea level 2.  I anticipate with consistent attendance, pt will show  measurable progress in the next 30 day assessment.  Pt has completed 20 exercise sessions and 7 education classes.  Pt with weight loss of 1.6 kg since 8/22.  Pt maintained level 2 on the nustep and averages 8-10 laps in 15 minutes.  Pt rates her dyspnea level 2. Pt resiistant to increasing workloads. Difficult to motivate to increase activity.  Will moitor any  progress in the next 30 day assessment.  Pt graduates today with the completion of 24 exercise sessions.  Pt plans to exercise at the pulmonary rehab maintenance program here at [facility].   Expected Outcomes  See Admission Outcomes/Goals  See Admission Outcomes/Goals  See Admission Outcomes/Goals  See Admission Outcomes/Goals  -   Row Name 08/31/18 2147             Core Components/Risk Factors/Patient Goals Review   Review  Pt graduates today with the completion of 22 exercise sessions.  Pt plans to exercise at the pulmonary rehab maintenance program here at [facility].       Expected Outcomes  Pt made progress toward meeting Program goals and outcomes.          Exercise Goals and Review: Exercise Goals    Row Name 04/09/18 1615             Exercise Goals   Increase Physical Activity  Yes       Intervention  Provide advice, education, support and counseling about physical activity/exercise needs.;Develop an individualized exercise prescription for aerobic and resistive training based on initial evaluation findings, risk stratification, comorbidities and participant's personal goals.       Expected Outcomes  Short Term: Attend rehab on a regular basis to increase amount of physical activity.;Long Term: Add in home exercise to make exercise part of routine and to increase amount of physical activity.;Long Term: Exercising regularly at least 3-5 days a week.       Increase Strength and Stamina  Yes  Intervention  Provide advice, education, support and counseling about physical activity/exercise needs.;Develop an individualized exercise  prescription for aerobic and resistive training based on initial evaluation findings, risk stratification, comorbidities and participant's personal goals.       Expected Outcomes  Short Term: Increase workloads from initial exercise prescription for resistance, speed, and METs.;Short Term: Perform resistance training exercises routinely during rehab and add in resistance training at home;Long Term: Improve cardiorespiratory fitness, muscular endurance and strength as measured by increased METs and functional capacity (6MWT)       Able to understand and use rate of perceived exertion (RPE) scale  Yes       Intervention  Provide education and explanation on how to use RPE scale       Expected Outcomes  Short Term: Able to use RPE daily in rehab to express subjective intensity level;Long Term:  Able to use RPE to guide intensity level when exercising independently       Able to understand and use Dyspnea scale  Yes       Intervention  Provide education and explanation on how to use Dyspnea scale       Expected Outcomes  Short Term: Able to use Dyspnea scale daily in rehab to express subjective sense of shortness of breath during exertion;Long Term: Able to use Dyspnea scale to guide intensity level when exercising independently       Knowledge and understanding of Target Heart Rate Range (THRR)  Yes       Intervention  Provide education and explanation of THRR including how the numbers were predicted and where they are located for reference       Expected Outcomes  Short Term: Able to state/look up THRR;Short Term: Able to use daily as guideline for intensity in rehab;Long Term: Able to use THRR to govern intensity when exercising independently       Understanding of Exercise Prescription  Yes       Intervention  Provide education, explanation, and written materials on patient's individual exercise prescription       Expected Outcomes  Short Term: Able to explain program exercise prescription;Long Term:  Able to explain home exercise prescription to exercise independently          Exercise Goals Re-Evaluation: Exercise Goals Re-Evaluation    Row Name 04/22/18 0736 05/20/18 0741 06/17/18 1645 07/16/18 0914       Exercise Goal Re-Evaluation   Exercise Goals Review  Increase Physical Activity;Increase Strength and Stamina;Able to understand and use rate of perceived exertion (RPE) scale;Able to understand and use Dyspnea scale;Knowledge and understanding of Target Heart Rate Range (THRR);Understanding of Exercise Prescription  Increase Physical Activity;Increase Strength and Stamina;Able to understand and use rate of perceived exertion (RPE) scale;Able to understand and use Dyspnea scale;Knowledge and understanding of Target Heart Rate Range (THRR);Understanding of Exercise Prescription  Increase Physical Activity;Increase Strength and Stamina;Able to understand and use rate of perceived exertion (RPE) scale;Able to understand and use Dyspnea scale;Knowledge and understanding of Target Heart Rate Range (THRR);Understanding of Exercise Prescription  Increase Physical Activity;Increase Strength and Stamina;Able to understand and use rate of perceived exertion (RPE) scale;Able to understand and use Dyspnea scale;Knowledge and understanding of Target Heart Rate Range (THRR);Understanding of Exercise Prescription    Comments  Patient's first day of exercise should be today.   Patient's progress will be slow due to extreme shortness of breath and deconditioning. Patient's average has increased from 1.5 to 1.9. Patient is able to walk 11 laps (200 ft  each) in 15 minutes. Will cont to monitor and motivate.  Patient's progress will be slow due to extreme shortness of breath and deconditioning. Patient's average has increased from 1.5 to 1.9. Patient is able to walk 11 laps (200 ft each) in 15 minutes. Patient is interested in Pulmonary Rehab Maintenance when she graduates. I discussed home exercise with patient and  highly encouraged. Will cont to monitor and motivate.  Pt has had slow progress. METs remain low ranging from 1.6 to 2.0. Pt walks 6-10 laps (1 lap=200 feet) in 15 minutes. Will continue to monitor and progress as able.     Expected Outcomes  Through rehab and exercising at home, patient will find it easier to carry out ADL's and will gain the confidence to establish an exercise routine at home.   Through rehab and exercising at home, patient will find it easier to carry out ADL's and will gain the confidence to establish an exercise routine at home.   Through rehab and exercising at home, patient will find it easier to carry out ADL's and will gain the confidence to establish an exercise routine at home.   Through rehab and exercising at home, patient will find it easier to carry out ADL's and will gain the confidence to establish an exercise routine at home.        Nutrition & Weight - Outcomes:    Nutrition: Nutrition Therapy & Goals - 08/01/18 1655      Nutrition Therapy   Diet  consistent carbohydrate heart healthy      Personal Nutrition Goals   Nutrition Goal  Pt to identify and limit food sources of sodium, saturated fat, refined carbohydrates    Personal Goal #2  Identify food quantities necessary to achieve wt loss of  -1# per week to a goal wt loss of 5-7 lb at graduation from pulmonary rehab.   weight loss goal not met, continue to encourage weight loss   Personal Goal #3  Pt able to name foods that affect blood glucose    nutrition goal met     Intervention Plan   Intervention  Prescribe, educate and counsel regarding individualized specific dietary modifications aiming towards targeted core components such as weight, hypertension, lipid management, diabetes, heart failure and other comorbidities.    Expected Outcomes  Short Term Goal: Understand basic principles of dietary content, such as calories, fat, sodium, cholesterol and nutrients.       Nutrition  Discharge: Nutrition Assessments - 08/01/18 1615      Rate Your Plate Scores   Pre Score  43    Post Score  37       Education Questionnaire Score: Knowledge Questionnaire Score - 07/24/18 1617      Knowledge Questionnaire Score   Post Score  14/18       Goals reviewed with patient. Cherre Huger, BSN Cardiac and Training and development officer

## 2018-08-31 NOTE — Addendum Note (Signed)
Encounter addended by: Rowe Pavy, RN on: 08/31/2018 9:52 PM  Actions taken: Episode resolved, Flowsheet data copied forward, Visit Navigator Flowsheet section accepted, Clinical Note Signed

## 2018-09-01 ENCOUNTER — Ambulatory Visit (INDEPENDENT_AMBULATORY_CARE_PROVIDER_SITE_OTHER): Payer: Medicare Other | Admitting: Primary Care

## 2018-09-01 ENCOUNTER — Encounter: Payer: Self-pay | Admitting: Primary Care

## 2018-09-01 VITALS — BP 100/70 | HR 84 | Temp 97.6°F | Ht 65.5 in | Wt 155.2 lb

## 2018-09-01 DIAGNOSIS — J449 Chronic obstructive pulmonary disease, unspecified: Secondary | ICD-10-CM | POA: Diagnosis not present

## 2018-09-01 MED ORDER — FLUTICASONE-UMECLIDIN-VILANT 100-62.5-25 MCG/INH IN AEPB: 1.0000 | INHALATION_SPRAY | Freq: Every day | RESPIRATORY_TRACT | 1 refills | Status: DC

## 2018-09-01 MED ORDER — FLUTICASONE-UMECLIDIN-VILANT 100-62.5-25 MCG/INH IN AEPB: 1.0000 | INHALATION_SPRAY | Freq: Every day | RESPIRATORY_TRACT | 3 refills | Status: DC

## 2018-09-01 NOTE — Patient Instructions (Addendum)
COPD Treatment: - Stop all nebulizers - Start Trelegy 1 puff daily - Continue Albuterol rescue inhaler, take 2 puffs every 6 hours as needed for shortness of breath or wheezing   *If like Trelegy inhaler, please let us know and we will send an RX to pharmacy (do not stop taking)   Follow-up: Next visit schedule on February 13th at 1:30pm with Dr. Elsworth Soho

## 2018-09-01 NOTE — Progress Notes (Signed)
_0  ID: [name], female    DOB: October 30, 1934, 83 y.o.   MRN: [id number]  Chief Complaint  Patient presents with  . Follow-up    breathing is a little worse, cough getting better,     Referring provider: Janith Lima, MD  HPI: 83 year old female, former smoker quit in 2004 (40 pack year hx). PMH significant for COPD, chronic respiratory failure with hypoxia (2L oxygen), HTN, afib (not anticoagulated), chronic combined systolic and diastolic congestive heart failure, breast cancer s/p resection, recurrent bladder cancer s/p TURP/chemo. Patient of Dr. Elsworth Soho, seen for initial consult on 02/18/18. Admitted in April 2019 for COPD exacerbation. Most recently seen by pulmonary NP on 08/05/18 for cough, chest congestion and sore throat symptoms treated with omnicef and prednisone taper. Maintained on Pulmicort and Brovana nebulizer twice daily. Up today with Pneumovax and Prevnar.   08/22/2018 Patient presents today for acute visit with complaints of ongoing shortness of breath and np cough. Experiences sob all the time but mostly with exertion. She is chronically on 2L oxygen, O2 sat today 94%. Cough is mostly dry, occassionally she gets some clear/yellow mucus up. Having left chest wall chest pain with cough, states that it feels muscular in nature. States that she cough so much the last two nights. Associated weakness. Completed omnicef course and prednisone. Felt better for 1 day. Not currently taking anything additional for her cough. No fever.   09/02/2018 Patient presents today for 1-2 week follow-up. At her last visit she received xopenex neb and depo-medrol 26m injection. Given RX for ipatroprium nebulizers 3-4 time a day scheduled. BNP was 483, given 261moral lasix x 5 days. She reports that her cough has improved a lot. Completed doxycyline, reports constipation. She did not fill ipatroprium nebulizer, states that she is not going to do another nebulizer treatment. Discussed  options including Trelegy inhaler or Symbicort/Spiriva. In-check dial peak flow normal set on ellipta inhaler.    Testing:  06/19 Spirometry - FVC 57%, FEV1 137%, ratio 48  04/19 CXR - COPD and chronic bronchitic changes   Allergies  Allergen Reactions  . Heparin     Previous history of hemorrhagic stroke.  . Marland Kitchenorphine And Related Nausea And Vomiting    Immunization History  Administered Date(s) Administered  . Influenza, High Dose Seasonal PF 06/08/2016, 05/13/2017, 06/10/2018  . Influenza-Unspecified 07/05/2015  . Pneumococcal Conjugate-13 05/20/2014  . Pneumococcal Polysaccharide-23 10/09/2011, 06/17/2017  . Tdap 09/03/2005, 11/09/2015  . Zoster 09/04/2007    Past Medical History:  Diagnosis Date  . Arthritis   . COPD (chronic obstructive pulmonary disease) ([facility]  . Dyspnea on exertion   . GERD (gastroesophageal reflux disease)   . Glaucoma of both eyes   . History of atrial fibrillation without current medication 2010   POST SURG 2010--  PER PT NO ISSUES SINCE  . History of breast cancer    DX DCIS IN 2004--  S/P RIGHT MASTECTOMY , NO CHEMORADIATION---  NO RECURRENCE  . History of CHF (congestive heart failure)    POST SURG 2010  . History of hypertension   . History of shingles    02/ 2015--  back of neck and left flank--  no residual pain  . ICH (intracerebral hemorrhage) ([facility]6/12/2014  . Nephrolithiasis   . Recurrent bladder papillary carcinoma ([facility]first dx 06/ 2014   s/p  turbt's  and instillation mitomycin c (chemo)//dx again in 2015-different type    Tobacco History: Social History   Tobacco  Use  Smoking Status Former Smoker  . Packs/day: 1.00  . Years: 40.00  . Pack years: 40.00  . Types: Cigarettes  . Last attempt to quit: 03/20/2003  . Years since quitting: 15.4  Smokeless Tobacco Never Used   Counseling given: Not Answered   Outpatient Medications Prior to Visit  Medication Sig Dispense Refill  . albuterol (PROVENTIL HFA;VENTOLIN HFA)  108 (90 Base) MCG/ACT inhaler Inhale 2 puffs into the lungs every 6 (six) hours as needed for wheezing or shortness of breath. 1 Inhaler 2  . arformoterol (BROVANA) 15 MCG/2ML NEBU Take 2 mLs (15 mcg total) by nebulization 2 (two) times daily. 120 mL 5  . atorvastatin (LIPITOR) 10 MG tablet TAKE ONE TABLET DAILY AT 6PM 60 tablet 1  . budesonide (PULMICORT) 0.5 MG/2ML nebulizer solution 2ML BY NEBULIZER TWICE DAILY 120 mL 5  . DIGOX 125 MCG tablet TAKE ONE TABLET DAILY 90 tablet 0  . diltiazem (CARDIZEM CD) 240 MG 24 hr capsule TAKE ONE CAPSULE EACH DAY 90 capsule 0  . furosemide (LASIX) 20 MG tablet 1 tab daily x 5 days 5 tablet 0  . latanoprost (XALATAN) 0.005 % ophthalmic solution Place 1 drop into both eyes at bedtime. Reported on 11/09/2015    . pantoprazole (PROTONIX) 40 MG tablet TAKE ONE TABLET DAILY 90 tablet 1  . SIMBRINZA 1-0.2 % SUSP Place 1 drop into both eyes 2 (two) times daily.   99  . ipratropium (ATROVENT) 0.02 % nebulizer solution Take 2.5 mLs (0.5 mg total) by nebulization 4 (four) times daily. (Patient not taking: Reported on 09/01/2018) 75 mL 12  . doxycycline (VIBRA-TABS) 100 MG tablet Take 1 tablet (100 mg total) by mouth 2 (two) times daily. 14 tablet 0  . predniSONE (DELTASONE) 10 MG tablet Take 4 tabs po daily x 2 days; then 3 tabs for 2 days; then 2 tabs for 2 days; then 1 tab for 2 days 20 tablet 0   No facility-administered medications prior to visit.     Review of Systems  Review of Systems  Constitutional: Negative.   HENT: Negative.   Respiratory: Positive for shortness of breath. Negative for apnea, cough, choking, chest tightness, wheezing and stridor.   Cardiovascular: Negative.     Physical Exam  BP 100/70 (BP Location: Left Arm, Cuff Size: Normal)   Pulse 84   Temp 97.6 F (36.4 C)   Ht 5' 5.5" (1.664 m)   Wt 155 lb 3.2 oz (70.4 kg)   LMP 12/03/2002   SpO2 98%   BMI 25.43 kg/m  Physical Exam Constitutional:      Appearance: Normal appearance.  She is not ill-appearing.  HENT:     Head: Normocephalic and atraumatic.     Nose: Nose normal.     Mouth/Throat:     Mouth: Mucous membranes are moist.     Pharynx: Oropharynx is clear.  Eyes:     Extraocular Movements: Extraocular movements intact.     Pupils: Pupils are equal, round, and reactive to light.  Neck:     Musculoskeletal: Normal range of motion and neck supple.  Cardiovascular:     Rate and Rhythm: Normal rate and regular rhythm.  Pulmonary:     Effort: Pulmonary effort is normal. No respiratory distress.     Breath sounds: Normal breath sounds. No wheezing.  Musculoskeletal: Normal range of motion.  Skin:    General: Skin is warm and dry.  Neurological:     General: No focal deficit present.  Mental Status: She is alert and oriented to person, place, and time.     Comments: Forgetful   Psychiatric:        Mood and Affect: Mood normal.        Behavior: Behavior normal.        Thought Content: Thought content normal.        Judgment: Judgment normal.      Lab Results:  CBC    Component Value Date/Time   WBC 11.7 (H) 08/22/2018 1507   RBC 4.89 08/22/2018 1507   HGB 15.4 (H) 08/22/2018 1507   HCT 46.9 (H) 08/22/2018 1507   PLT 226.0 08/22/2018 1507   MCV 95.9 08/22/2018 1507   MCH 30.1 12/27/2017 0217   MCHC 32.9 08/22/2018 1507   RDW 17.5 (H) 08/22/2018 1507   LYMPHSABS 2.4 02/23/2015 0751   MONOABS 0.5 02/23/2015 0751   EOSABS 0.3 02/23/2015 0751   BASOSABS 0.1 02/23/2015 0751    BMET    Component Value Date/Time   NA 139 08/22/2018 1507   NA 143 04/19/2017 0936   K 3.8 08/22/2018 1507   CL 99 08/22/2018 1507   CO2 31 08/22/2018 1507   GLUCOSE 93 08/22/2018 1507   BUN 21 08/22/2018 1507   BUN 20 04/19/2017 0936   CREATININE 0.84 08/22/2018 1507   CREATININE 0.69 07/11/2015 0910   CALCIUM 8.9 08/22/2018 1507   GFRNONAA 58 (L) 12/28/2017 0536   GFRAA >60 12/28/2017 0536    BNP    Component Value Date/Time   BNP 506.5 (H)  12/27/2017 0931    ProBNP    Component Value Date/Time   PROBNP 483.0 (H) 08/22/2018 1507    Imaging: Dg Chest 2 View  Result Date: 08/22/2018 CLINICAL DATA:  83 year old female with a history of worsening dyspnea EXAM: CHEST - 2 VIEW COMPARISON:  12/26/2017 FINDINGS: Cardiomediastinal silhouette similar in size and contour. No evidence of central vascular congestion. No interlobular septal thickening. Compared to the prior plain film there is resolution of the pleural fluid on the left. Persistent blunting of the right costophrenic angle compared to the prior. No confluent airspace disease. Linear opacity overlying the left heart border has been present on multiple prior plain films and is unchanged. Stigmata of emphysema, with increased retrosternal airspace, flattened hemidiaphragms, increased AP diameter, and hyperinflation on the AP view. Degenerative changes of the spine. IMPRESSION: Interval resolution of left-sided pleural effusion, with unchanged appearance of either residual trace right pleural effusion and/or scarring. Emphysema and chronic changes with no evidence of lobar pneumonia. Degenerative changes of the spine. Electronically Signed   By: Corrie Mckusick D.O.   On: 08/22/2018 16:04     Assessment & Plan:   COPD (chronic obstructive pulmonary disease) (HCC) - Breathing is worse, cough is better  - She did not start ipatroprium nebulizers - Agreeing to trial of new inhaler medication in place of Brovana and Pulmicort nebulizer  - Given samples of Trelegy, called pharmacy and it will cost her $30 a month for RX  - Continue Albuterol rescue inhaler, take 2 puffs every 6 hours as needed for shortness of breath or wheezing  - Follow up in February with Dr. Candise Che, NP 09/02/2018

## 2018-09-02 ENCOUNTER — Encounter: Payer: Self-pay | Admitting: Primary Care

## 2018-09-02 ENCOUNTER — Other Ambulatory Visit: Payer: Self-pay | Admitting: Cardiology

## 2018-09-02 NOTE — Assessment & Plan Note (Addendum)
-  Breathing is worse, cough is better  - She did not start ipatroprium nebulizers - Agreeing to trial of new inhaler medication in place of Brovana and Pulmicort nebulizer  - Given samples of Trelegy, called pharmacy and it will cost her $30 a month for RX  - Continue Albuterol rescue inhaler, take 2 puffs every 6 hours as needed for shortness of breath or wheezing  - Follow up in February with Dr. Elsworth Soho

## 2018-09-04 DIAGNOSIS — H2000 Unspecified acute and subacute iridocyclitis: Secondary | ICD-10-CM | POA: Diagnosis not present

## 2018-09-16 ENCOUNTER — Encounter (HOSPITAL_COMMUNITY): Payer: Self-pay | Admitting: *Deleted

## 2018-09-16 NOTE — Progress Notes (Signed)
I have attempted to reach [name] twice about joining the Pulmonary Maintenance program per her request. I left messages both time at her home phone number. I have not received any calls back from her. Today I reached her on her cell phone. [name] states that she has been very busy, that they are downsizing their home.She has been busy trying to put their house on the market to sell and has just found out that her husband has Parkinson's. With all that she has going on I ask her if she would prefer to call me when she is ready to start the program and she agreed.

## 2018-09-19 ENCOUNTER — Telehealth: Payer: Self-pay | Admitting: Primary Care

## 2018-09-19 NOTE — Telephone Encounter (Signed)
LM for patient to call back make sure she is taking Trelegy inhaler. If she has found it beneficial need to send in RX

## 2018-09-19 NOTE — Telephone Encounter (Signed)
-----  Message from Martyn Ehrich, NP sent at 09/01/2018  5:55 PM EST ----- Call patient make sure she is taking Trelegy

## 2018-09-19 NOTE — Telephone Encounter (Signed)
Spoke with pt, she states the Trelegy is doing ok right now but wants to know if she should still be using her nebulizer medication? She states she is having some chest congestion but doesn't know if its from the Trelegy or something else. She states she already has a prescription for [facility] please advise.

## 2018-09-22 ENCOUNTER — Telehealth: Payer: Self-pay | Admitting: Primary Care

## 2018-09-22 NOTE — Telephone Encounter (Signed)
Chest congestion would not be from Trelegy. She can take continue nebulizer and take Mucinex twice daily.

## 2018-09-22 NOTE — Telephone Encounter (Signed)
Spoke with the pt and notified of Beth's response per last phone note-   Chest congestion would not be from Trelegy. She can take continue nebulizer and take Mucinex twice daily.   Pt verbalized understanding and states nothing further needed

## 2018-09-22 NOTE — Telephone Encounter (Signed)
LMTCB x1 for pt.

## 2018-09-23 ENCOUNTER — Other Ambulatory Visit: Payer: Self-pay | Admitting: Cardiology

## 2018-09-23 NOTE — Telephone Encounter (Signed)
Rx request sent to pharmacy.

## 2018-09-24 ENCOUNTER — Encounter: Payer: Self-pay | Admitting: Internal Medicine

## 2018-09-24 NOTE — Progress Notes (Signed)
Abstracted and sent to scan

## 2018-10-07 ENCOUNTER — Other Ambulatory Visit: Payer: Self-pay | Admitting: Cardiology

## 2018-10-10 DIAGNOSIS — T1512XA Foreign body in conjunctival sac, left eye, initial encounter: Secondary | ICD-10-CM | POA: Diagnosis not present

## 2018-10-10 DIAGNOSIS — H43812 Vitreous degeneration, left eye: Secondary | ICD-10-CM | POA: Diagnosis not present

## 2018-10-10 DIAGNOSIS — B0052 Herpesviral keratitis: Secondary | ICD-10-CM | POA: Diagnosis not present

## 2018-10-13 DIAGNOSIS — B0052 Herpesviral keratitis: Secondary | ICD-10-CM | POA: Diagnosis not present

## 2018-10-16 ENCOUNTER — Encounter: Payer: Self-pay | Admitting: Pulmonary Disease

## 2018-10-16 ENCOUNTER — Ambulatory Visit (INDEPENDENT_AMBULATORY_CARE_PROVIDER_SITE_OTHER): Payer: Medicare Other | Admitting: Pulmonary Disease

## 2018-10-16 ENCOUNTER — Telehealth: Payer: Self-pay | Admitting: Pulmonary Disease

## 2018-10-16 DIAGNOSIS — J9611 Chronic respiratory failure with hypoxia: Secondary | ICD-10-CM | POA: Diagnosis not present

## 2018-10-16 DIAGNOSIS — J418 Mixed simple and mucopurulent chronic bronchitis: Secondary | ICD-10-CM | POA: Diagnosis not present

## 2018-10-16 MED ORDER — IPRATROPIUM BROMIDE 0.02 % IN SOLN: 0.5000 mg | Freq: Two times a day (BID) | RESPIRATORY_TRACT | 2 refills | Status: DC

## 2018-10-16 NOTE — Patient Instructions (Signed)
Continue on budesonide and Brovana. Prescription for Atrovent nebs twice daily x 60 x 2 refills.  Oxygen recertification will be sent. Call us as needed

## 2018-10-16 NOTE — Assessment & Plan Note (Signed)
Ambulatory saturation to recertify need for oxygen

## 2018-10-16 NOTE — Assessment & Plan Note (Signed)
Continue on budesonide and Brovana. Prescription for Atrovent nebs twice daily x 60 x 2 refills.

## 2018-10-16 NOTE — Progress Notes (Signed)
Subjective:    Patient ID: [name], female    DOB: [birth year], 84 y.o.   MRN: [id number]  HPI  84 year old remote smoker for follow-up of COPD. She smoked 40 pack years before she quit in 2004  PMH - atrial fibrillation not on anticoagulation due to prior history of intracranial hemorrhage in 2016. Exacerbation 12/21/2017 and 08/22/2018  Chief Complaint  Patient presents with  . Follow-up    pt states breathing has been the same since last visit; denies chest tightness/pain; is onl 2L O2, needs requalification for oxygen   She had her last flareup 08/2018, chest x-ray 08/22/2018 was reviewed which shows resolution of pleural effusion and no new infiltrates.  She feels recovered back to her baseline. She was given Trelegy which she did not tolerate due to constipation and worsening dyspnea. She is back to her old regimen of budesonide and Brovana nebs twice daily.  Although Atrovent is listed in her medication list, she does not seem to have this.  She is compliant with her oxygen and has received a letter from her DME about recertification  Room air saturation was 86%.improved with oxygen  Significant tests/ events reviewed  Spirometry 02/2018  ratio 48, FEV1 37% and FVC 57% consistent with severe obstruction  Review of Systems neg for any significant sore throat, dysphagia, itching, sneezing, nasal congestion or excess/ purulent secretions, fever, chills, sweats, unintended wt loss, pleuritic or exertional cp, hempoptysis, orthopnea pnd or change in chronic leg swelling. Also denies presyncope, palpitations, heartburn, abdominal pain, nausea, vomiting, diarrhea or change in bowel or urinary habits, dysuria,hematuria, rash, arthralgias, visual complaints, headache, numbness weakness or ataxia.     Objective:   Physical Exam   Gen. Pleasant, well-nourished, in no distress ENT - no thrush, no pallor/icterus,no post nasal drip Neck: No JVD, no thyromegaly, no carotid  bruits Lungs: no use of accessory muscles, no dullness to percussion, decresead without rales or rhonchi  Cardiovascular: Rhythm regular, heart sounds  normal, no murmurs or gallops, no peripheral edema Musculoskeletal: No deformities, no cyanosis or clubbing         Assessment & Plan:

## 2018-10-16 NOTE — Telephone Encounter (Signed)
Medication has been resent to pharmacy.

## 2018-10-27 DIAGNOSIS — B0052 Herpesviral keratitis: Secondary | ICD-10-CM | POA: Diagnosis not present

## 2018-10-27 DIAGNOSIS — H52203 Unspecified astigmatism, bilateral: Secondary | ICD-10-CM | POA: Diagnosis not present

## 2018-10-27 DIAGNOSIS — H401134 Primary open-angle glaucoma, bilateral, indeterminate stage: Secondary | ICD-10-CM | POA: Diagnosis not present

## 2018-10-27 DIAGNOSIS — H04123 Dry eye syndrome of bilateral lacrimal glands: Secondary | ICD-10-CM | POA: Diagnosis not present

## 2018-11-19 ENCOUNTER — Other Ambulatory Visit: Payer: Self-pay | Admitting: Internal Medicine

## 2018-11-24 DIAGNOSIS — H401134 Primary open-angle glaucoma, bilateral, indeterminate stage: Secondary | ICD-10-CM | POA: Diagnosis not present

## 2018-11-24 DIAGNOSIS — H04123 Dry eye syndrome of bilateral lacrimal glands: Secondary | ICD-10-CM | POA: Diagnosis not present

## 2018-11-24 DIAGNOSIS — B0052 Herpesviral keratitis: Secondary | ICD-10-CM | POA: Diagnosis not present

## 2018-12-23 ENCOUNTER — Telehealth: Payer: Self-pay | Admitting: Cardiology

## 2018-12-23 ENCOUNTER — Other Ambulatory Visit: Payer: Self-pay | Admitting: Cardiology

## 2018-12-23 NOTE — Telephone Encounter (Signed)
LVM for patient to call back to schedule appt from recall on 01/01/19

## 2018-12-24 NOTE — Progress Notes (Signed)
Virtual Visit via Video Note changed to telephone visit at patient request; difficulty with video technology   This visit type was conducted due to national recommendations for restrictions regarding the COVID-19 Pandemic (e.g. social distancing) in an effort to limit this patient's exposure and mitigate transmission in our community.  Due to her co-morbid illnesses, this patient is at least at moderate risk for complications without adequate follow up.  This format is felt to be most appropriate for this patient at this time.  All issues noted in this document were discussed and addressed.  A limited physical exam was performed with this format.  Please refer to the patient's chart for her consent to telehealth for [facility].   Evaluation Performed:  Follow-up visit  Date:  01/01/2019   ID:  [name], DOB 05-19-1935, MRN [id number]  Patient Location: Home Provider Location: Home  PCP:  Janith Lima, MD  Cardiologist:  Dr Stanford Breed   Chief Complaint:  FU atrial fibrillation  History of Present Illness:    FU atrial fibrillation. Echocardiogram June 2016 showed vigorous LV function, mild left ventricular hypertrophy, grade 1 diastolic dysfunction and mild right atrial enlargement. Patient was admitted in June 2016 with a large right cerebellar hemorrhage. Note her only anticoagulate at that time was aspirin. The hemorrhage required surgical evacuation. Patient developed atrial fibrillation during the hospitalization with difficult to control heart rate. She was felt not to be a candidate for anticoagulation given intracranial hemorrhage. Holter 7/16 showed atrial fibrillation with elevated rate; digoxin added. I previously contacted Dr Kathyrn Sheriff Neurosurgery to see if she would be a candidate for short-term anticoagulation which would then qualify her for a watchman device. He felt this could be pursued. Since last seen,she has some dyspnea which is baseline.  No chest pain,  palpitations or syncope.  Over the past 2 weeks she has noticed swelling in her left lower extremity.  This is predominantly in the left calf and knee area.  She also describes erythema and a rash over that area.  She denies fevers, chills and there is no right lower extremity edema.  The patient does not have symptoms concerning for COVID-19 infection (fever, chills, cough, or new shortness of breath).    Past Medical History:  Diagnosis Date  . Arthritis   . COPD (chronic obstructive pulmonary disease) ([place])   . Dyspnea on exertion   . GERD (gastroesophageal reflux disease)   . Glaucoma of both eyes   . History of atrial fibrillation without current medication 2010   POST SURG 2010--  PER PT NO ISSUES SINCE  . History of breast cancer    DX DCIS IN 2004--  S/P RIGHT MASTECTOMY , NO CHEMORADIATION---  NO RECURRENCE  . History of CHF (congestive heart failure)    POST SURG 2010  . History of hypertension   . History of shingles    02/ 2015--  back of neck and left flank--  no residual pain  . ICH (intracerebral hemorrhage) ([place]) 02/05/2015  . Nephrolithiasis   . Recurrent bladder papillary carcinoma ([place]) first dx 06/ 2014   s/p  turbt's  and instillation mitomycin c (chemo)//dx again in 2015-different type   Past Surgical History:  Procedure Laterality Date  . CATARACT EXTRACTION W/ INTRAOCULAR LENS  IMPLANT, BILATERAL    . CRANIECTOMY N/A 02/05/2015   Procedure: SUBOCCIPITAL CRANIECTOMY EVACUATION OF HEMATOMA;  Surgeon: Consuella Lose, MD;  Location: [place] [facility];  Service: Neurosurgery;  Laterality: N/A;  . CYSTOSCOPY W/  RETROGRADES Bilateral 08/11/2014   Procedure: CYSTOSCOPY WITH BILATERAL RETROGRADE PYELOGRAM AND MITOMYCIN INSTILLATION;  Surgeon: Alexis Frock, MD;  Location: [facility];  Service: Urology;  Laterality: Bilateral;  . CYSTOSCOPY W/ URETERAL STENT PLACEMENT Bilateral 03/26/2013   Procedure: CYSTOSCOPY WITH BILATERAL RETROGRADE PYELOGRAM/URETERAL  STENT PLACEMENT;  Surgeon: Alexis Frock, MD;  Location: [facility];  Service: Urology;  Laterality: Bilateral;  . CYSTOSCOPY W/ URETERAL STENT PLACEMENT Left 05/13/2013   Procedure: CYSTOSCOPY WITH RETROGRADE PYELOGRAM/URETERAL STENT PLACEMENT STENT EXCHANGE;  Surgeon: Alexis Frock, MD;  Location: [facility];  Service: Urology;  Laterality: Left;  . PARTIAL MASTECTOMY WITH NEEDLE LOCALIZATION Right 01-14-2003   DCIS  . REMOVAL CYST LEFT HAND  2013  . TOTAL KNEE ARTHROPLASTY Right 03-22-2009  . TOTAL MASTECTOMY Right 02-03-2003   W/ SLN BX  AND  POST 03-01-2003 EVACUATION HEMATOMA  . TRANSTHORACIC ECHOCARDIOGRAM  03-25-2009   MILD LVF/ EF 70-80%/ MILD INCREASE SYSTOLIC PULMONARY  PRESSURE  . TRANSURETHRAL RESECTION OF BLADDER TUMOR WITH GYRUS (TURBT-GYRUS) N/A 03/26/2013   Procedure: TRANSURETHRAL RESECTION OF BLADDER TUMOR WITH GYRUS (TURBT-GYRUS);  Surgeon: Alexis Frock, MD;  Location: [facility];  Service: Urology;  Laterality: N/A;  . TRANSURETHRAL RESECTION OF BLADDER TUMOR WITH GYRUS (TURBT-GYRUS) N/A 05/13/2013   Procedure: TRANSURETHRAL RESECTION OF BLADDER TUMOR WITH GYRUS (TURBT-GYRUS)  RE-STAGING TRANSURETHRAL RESECTION OF BLADDER TUMOR, LEFT RETROGRADE PYELOGRAM AND STENT EXCHANGE;  Surgeon: Alexis Frock, MD;  Location: [facility];  Service: Urology;  Laterality: N/A;  . TRANSURETHRAL RESECTION OF BLADDER TUMOR WITH GYRUS (TURBT-GYRUS) N/A 08/11/2014   Procedure: TRANSURETHRAL RESECTION OF BLADDER TUMOR WITH GYRUS (TURBT-GYRUS);  Surgeon: Alexis Frock, MD;  Location: [facility];  Service: Urology;  Laterality: N/A;  . TUBAL LIGATION       Current Meds  Medication Sig  . albuterol (PROVENTIL HFA;VENTOLIN HFA) 108 (90 Base) MCG/ACT inhaler Inhale 2 puffs into the lungs every 6 (six) hours as needed for wheezing or shortness of breath.  Marland Kitchen arformoterol (BROVANA) 15 MCG/2ML NEBU USE 1 VIAL  IN   NEBULIZER TWICE  DAILY - (Morning and Evening)  . atorvastatin (LIPITOR) 10 MG tablet TAKE ONE TABLET DAILY AT 6PM  . budesonide (PULMICORT) 0.5 MG/2ML nebulizer solution 2ML BY NEBULIZER TWICE DAILY  . digoxin (LANOXIN) 0.125 MG tablet TAKE ONE TABLET DAILY  . diltiazem (CARDIZEM CD) 240 MG 24 hr capsule TAKE ONE CAPSULE EACH DAY  . fluorometholone (FML) 0.1 % ophthalmic suspension   . Fluticasone-Umeclidin-Vilant (TRELEGY ELLIPTA) 100-62.5-25 MCG/INH AEPB Inhale 1 puff into the lungs daily.  Marland Kitchen ipratropium (ATROVENT) 0.02 % nebulizer solution Take 2.5 mLs (0.5 mg total) by nebulization 2 (two) times daily.  Marland Kitchen latanoprost (XALATAN) 0.005 % ophthalmic solution Place 1 drop into both eyes at bedtime. Reported on 11/09/2015  . OXYGEN Inhale into the lungs.  . pantoprazole (PROTONIX) 40 MG tablet TAKE ONE TABLET DAILY  . SIMBRINZA 1-0.2 % SUSP Place 1 drop into both eyes 2 (two) times daily.      Allergies:   Heparin and Morphine and related   Social History   Tobacco Use  . Smoking status: Former Smoker    Packs/day: 1.00    Years: 40.00    Pack years: 40.00    Types: Cigarettes    Last attempt to quit: 03/20/2003    Years since quitting: 15.7  . Smokeless tobacco: Never Used  Substance Use Topics  . Alcohol use: No    Alcohol/week: 0.0 standard  drinks  . Drug use: No     Family Hx: The patient's family history includes Breast cancer (age of onset: 78) in her maternal aunt; Breast cancer (age of onset: 44) in her daughter; Breast cancer (age of onset: 1) in an other family member; Cancer in her maternal aunt; Colon cancer in her maternal grandmother; Colon cancer (age of onset: 64) in her mother; Stroke in her maternal grandmother and paternal grandmother. There is no history of Heart attack or Hypertension.  ROS:   Please see the history of present illness.   Left lower extremity edema as outlined. No fevers or chills and no productive cough. All other systems reviewed and are  negative.   Recent Labs: 08/22/2018: BUN 21; Creatinine, Ser 0.84; Hemoglobin 15.4; Platelets 226.0; Potassium 3.8; Pro B Natriuretic peptide (BNP) 483.0; Sodium 139   Recent Lipid Panel Lab Results  Component Value Date/Time   CHOL 121 04/19/2017 09:36 AM   TRIG 114 04/19/2017 09:36 AM   HDL 46 04/19/2017 09:36 AM   CHOLHDL 2.6 04/19/2017 09:36 AM   CHOLHDL 3 05/09/2016 12:16 PM   LDLCALC 52 04/19/2017 09:36 AM    Wt Readings from Last 3 Encounters:  01/01/19 155 lb (70.3 kg)  10/16/18 156 lb (70.8 kg)  09/01/18 155 lb 3.2 oz (70.4 kg)     Objective:    Vital Signs:  Ht _0  (1.676 m)   Wt 155 lb (70.3 kg)   LMP 12/03/2002   BMI 25.02 kg/m    VITAL SIGNS:  reviewed  No acute distress Answers questions appropriately Normal affect Remainder of physical examination not performed (telehealth visit; coronavirus pandemic)  ASSESSMENT & PLAN:    1. Permanent atrial fibrillation-continue Cardizem and digoxin for rate control.  We previously discussed anticoagulation to reduce the risk of embolic event.  However she had a prior intracranial hemorrhage on aspirin.  We have previously discussed the possibility of short-term anticoagulation and placement of watchman device.  Neurosurgery felt this would be a possibility.  However patient does not want to pursue this.  She understands the higher risk of CVA. 2. Hypertension-patient's blood pressure is controlled by report.  Continue present medications and follow.   3. Hyperlipidemia-continue statin.   4. Chronic diastolic congestive heart failure-based on history she is euvolemic. 5. Left lower extremity edema-patient describes lower extremity edema as well as erythema and rash.  She denies fevers or chills.  Given this is a telehealth visit I cannot examine her leg.  I will arrange venous Dopplers tomorrow to exclude DVT.  I will see her immediately after in the office to rule out cellulitis. 6. COPD-managed by pulmonary.   COVID-19 Education: The importance of social distancing was discussed today.  Time:   Today, I have spent 15 minutes with the patient with telehealth technology discussing the above problems.     Medication Adjustments/Labs and Tests Ordered: Current medicines are reviewed at length with the patient today.  Concerns regarding medicines are outlined above.   Tests Ordered: No orders of the defined types were placed in this encounter.   Medication Changes: No orders of the defined types were placed in this encounter.   Disposition:  Follow up in 1 day(s)  Signed, Kirk Ruths, MD  01/01/2019 8:48 AM    [facility]

## 2018-12-31 ENCOUNTER — Telehealth: Payer: Self-pay | Admitting: Cardiology

## 2018-12-31 NOTE — Telephone Encounter (Signed)
Call smartphone/ my chart/ consent/ pre reg completed

## 2019-01-01 ENCOUNTER — Encounter: Payer: Self-pay | Admitting: Cardiology

## 2019-01-01 ENCOUNTER — Telehealth (INDEPENDENT_AMBULATORY_CARE_PROVIDER_SITE_OTHER): Payer: Medicare Other | Admitting: Cardiology

## 2019-01-01 VITALS — Ht 66.0 in | Wt 155.0 lb

## 2019-01-01 DIAGNOSIS — R600 Localized edema: Secondary | ICD-10-CM | POA: Diagnosis not present

## 2019-01-01 DIAGNOSIS — I4821 Permanent atrial fibrillation: Secondary | ICD-10-CM

## 2019-01-01 DIAGNOSIS — I10 Essential (primary) hypertension: Secondary | ICD-10-CM

## 2019-01-01 NOTE — Progress Notes (Signed)
HPI: FU atrial fibrillation. Echocardiogram June 2016 showed vigorous LV function, mild left ventricular hypertrophy, grade 1 diastolic dysfunction and mild right atrial enlargement. Patient was admitted in June 2016 with a large right cerebellar hemorrhage. Note her only anticoagulate at that time was aspirin. The hemorrhage required surgical evacuation. Patient developed atrial fibrillation during the hospitalization with difficult to control heart rate. She was felt not to be a candidate for anticoagulation given intracranial hemorrhage. Holter 7/16 showed atrial fibrillation with elevated rate; digoxin added. Ipreviously contacted Dr Kathyrn Sheriff Neurosurgery to see if she would be a candidate for short-term anticoagulation which would then qualify her for a watchman device. He felt this could be pursued.  During telehealth visit yesterday patient described unilateral left lower extremity edema and erythema.  I was concerned about the possibility of DVT and scheduled her for an ultrasound today and then follow-up with me. Since last seen,she denies chest pain, palpitations or syncope.  She has some dyspnea on exertion.  She has left lower extremity edema and erythema.  Current Outpatient Medications  Medication Sig Dispense Refill   albuterol (PROVENTIL HFA;VENTOLIN HFA) 108 (90 Base) MCG/ACT inhaler Inhale 2 puffs into the lungs every 6 (six) hours as needed for wheezing or shortness of breath. 1 Inhaler 2   arformoterol (BROVANA) 15 MCG/2ML NEBU USE 1 VIAL  IN  NEBULIZER TWICE  DAILY - (Morning and Evening) 60 mL 11   atorvastatin (LIPITOR) 10 MG tablet TAKE ONE TABLET DAILY AT 6PM 90 tablet 1   budesonide (PULMICORT) 0.5 MG/2ML nebulizer solution 2ML BY NEBULIZER TWICE DAILY 120 mL 5   digoxin (LANOXIN) 0.125 MG tablet TAKE ONE TABLET DAILY 90 tablet 0   diltiazem (CARDIZEM CD) 240 MG 24 hr capsule TAKE ONE CAPSULE EACH DAY 90 capsule 3   fluorometholone (FML) 0.1 % ophthalmic  suspension      Fluticasone-Umeclidin-Vilant (TRELEGY ELLIPTA) 100-62.5-25 MCG/INH AEPB Inhale 1 puff into the lungs daily. 60 each 3   ipratropium (ATROVENT) 0.02 % nebulizer solution Take 2.5 mLs (0.5 mg total) by nebulization 2 (two) times daily. 60 mL 2   latanoprost (XALATAN) 0.005 % ophthalmic solution Place 1 drop into both eyes at bedtime. Reported on 11/09/2015     OXYGEN Inhale into the lungs.     pantoprazole (PROTONIX) 40 MG tablet TAKE ONE TABLET DAILY 90 tablet 1   SIMBRINZA 1-0.2 % SUSP Place 1 drop into both eyes 2 (two) times daily.   99   No current facility-administered medications for this visit.      Past Medical History:  Diagnosis Date   Arthritis    COPD (chronic obstructive pulmonary disease) ([place])    Dyspnea on exertion    GERD (gastroesophageal reflux disease)    Glaucoma of both eyes    History of atrial fibrillation without current medication 2010   POST SURG 2010--  PER PT NO ISSUES SINCE   History of breast cancer    DX DCIS IN 2004--  S/P RIGHT MASTECTOMY , NO CHEMORADIATION---  NO RECURRENCE   History of CHF (congestive heart failure)    POST SURG 2010   History of hypertension    History of shingles    02/ 2015--  back of neck and left flank--  no residual pain   ICH (intracerebral hemorrhage) ([place]) 02/05/2015   Nephrolithiasis    Recurrent bladder papillary carcinoma ([place]) first dx 06/ 2014   s/p  turbt's  and instillation mitomycin c (chemo)//dx again in 2015-different  type    Past Surgical History:  Procedure Laterality Date   CATARACT EXTRACTION W/ INTRAOCULAR LENS  IMPLANT, BILATERAL     CRANIECTOMY N/A 02/05/2015   Procedure: SUBOCCIPITAL CRANIECTOMY EVACUATION OF HEMATOMA;  Surgeon: Consuella Lose, MD;  Location: [place] [facility];  Service: Neurosurgery;  Laterality: N/A;   CYSTOSCOPY W/ RETROGRADES Bilateral 08/11/2014   Procedure: CYSTOSCOPY WITH BILATERAL RETROGRADE PYELOGRAM AND MITOMYCIN INSTILLATION;  Surgeon:  Alexis Frock, MD;  Location: [facility];  Service: Urology;  Laterality: Bilateral;   CYSTOSCOPY W/ URETERAL STENT PLACEMENT Bilateral 03/26/2013   Procedure: CYSTOSCOPY WITH BILATERAL RETROGRADE PYELOGRAM/URETERAL STENT PLACEMENT;  Surgeon: Alexis Frock, MD;  Location: [facility];  Service: Urology;  Laterality: Bilateral;   CYSTOSCOPY W/ URETERAL STENT PLACEMENT Left 05/13/2013   Procedure: CYSTOSCOPY WITH RETROGRADE PYELOGRAM/URETERAL STENT PLACEMENT STENT EXCHANGE;  Surgeon: Alexis Frock, MD;  Location: [facility];  Service: Urology;  Laterality: Left;   PARTIAL MASTECTOMY WITH NEEDLE LOCALIZATION Right 01-14-2003   DCIS   REMOVAL CYST LEFT HAND  2013   TOTAL KNEE ARTHROPLASTY Right 03-22-2009   TOTAL MASTECTOMY Right 02-03-2003   W/ SLN BX  AND  POST 03-01-2003 EVACUATION HEMATOMA   TRANSTHORACIC ECHOCARDIOGRAM  03-25-2009   MILD LVF/ EF 70-80%/ MILD INCREASE SYSTOLIC PULMONARY  PRESSURE   TRANSURETHRAL RESECTION OF BLADDER TUMOR WITH GYRUS (TURBT-GYRUS) N/A 03/26/2013   Procedure: TRANSURETHRAL RESECTION OF BLADDER TUMOR WITH GYRUS (TURBT-GYRUS);  Surgeon: Alexis Frock, MD;  Location: [facility];  Service: Urology;  Laterality: N/A;   TRANSURETHRAL RESECTION OF BLADDER TUMOR WITH GYRUS (TURBT-GYRUS) N/A 05/13/2013   Procedure: TRANSURETHRAL RESECTION OF BLADDER TUMOR WITH GYRUS (TURBT-GYRUS)  RE-STAGING TRANSURETHRAL RESECTION OF BLADDER TUMOR, LEFT RETROGRADE PYELOGRAM AND STENT EXCHANGE;  Surgeon: Alexis Frock, MD;  Location: [facility];  Service: Urology;  Laterality: N/A;   TRANSURETHRAL RESECTION OF BLADDER TUMOR WITH GYRUS (TURBT-GYRUS) N/A 08/11/2014   Procedure: TRANSURETHRAL RESECTION OF BLADDER TUMOR WITH GYRUS (TURBT-GYRUS);  Surgeon: Alexis Frock, MD;  Location: [facility];  Service: Urology;  Laterality: N/A;   TUBAL LIGATION      Social History   Socioeconomic  History   Marital status: Married    Spouse name: Not on file   Number of children: Not on file   Years of education: Not on file   Highest education level: Not on file  Occupational History   Not on file  Social Needs   Financial resource strain: Not on file   Food insecurity:    Worry: Not on file    Inability: Not on file   Transportation needs:    Medical: Not on file    Non-medical: Not on file  Tobacco Use   Smoking status: Former Smoker    Packs/day: 1.00    Years: 40.00    Pack years: 40.00    Types: Cigarettes    Last attempt to quit: 03/20/2003    Years since quitting: 15.8   Smokeless tobacco: Never Used  Substance and Sexual Activity   Alcohol use: No    Alcohol/week: 0.0 standard drinks   Drug use: No   Sexual activity: Yes    Partners: Male    Birth control/protection: Surgical    Comment: BTL  Lifestyle   Physical activity:    Days per week: Not on file    Minutes per session: Not on file   Stress: Not on file  Relationships   Social connections:    Talks on  phone: Not on file    Gets together: Not on file    Attends religious service: Not on file    Active member of club or organization: Not on file    Attends meetings of clubs or organizations: Not on file    Relationship status: Not on file   Intimate partner violence:    Fear of current or ex partner: Not on file    Emotionally abused: Not on file    Physically abused: Not on file    Forced sexual activity: Not on file  Other Topics Concern   Not on file  Social History Narrative   Not on file    Family History  Problem Relation Age of Onset   Colon cancer Mother 21   Colon cancer Maternal Grandmother    Stroke Maternal Grandmother    Breast cancer Maternal Aunt 67   Cancer Maternal Aunt        ? ovarian   Breast cancer Other 60       breast cancer, ovarian cancer   Breast cancer Daughter 76   Stroke Paternal Grandmother    Heart attack Neg Hx     Hypertension Neg Hx     ROS: Edema left lower extremity and rash but no fevers or chills, productive cough, hemoptysis, dysphasia, odynophagia, melena, hematochezia, dysuria, hematuria, rash, seizure activity, orthopnea, PND, claudication. Remaining systems are negative.  Physical Exam: Well-developed frail in no acute distress.  Skin is warm and dry.  HEENT is normal.  Neck is supple.  Chest is clear to auscultation with normal expansion.  Cardiovascular exam is irregular Abdominal exam nontender or distended. No masses palpated. Extremities show 1+ edema left lower extremity from calf to ankle.  Minimal erythema.  Varicosities noted. neuro grossly intact  ECG-atrial fibrillation at a rate of 71, left ventricular hypertrophy with repolarization abnormality.  Personally reviewed  A/P  1. Permanent atrial fibrillation-continue Cardizem and digoxin for rate control.  Anticoagulation has been discussed extensively previously.  She had a prior intracranial hemorrhage on aspirin.  This required evacuation.  We have previously discussed the possibility of short-term anticoagulation and placement of watchman device.  Neurosurgery felt this would be a possibility.  However patient does not want to pursue this.  She understands the higher risk of CVA. 2. Hypertension-patient's blood pressure is controlled.  Continue present medications and follow.   3. Hyperlipidemia-continue statin.   4. Chronic diastolic congestive heart failure-she appears to be euvolemic.  Continue fluid restriction and low-sodium diet. 5. Left lower extremity edema-lower extremity venous Doppler preliminary report shows no DVT.  Likely venous insufficiency.  I have asked her to keep her feet elevated at night and to consider compression hose on the left. 6. COPD-managed by pulmonary.  Kirk Ruths, MD

## 2019-01-01 NOTE — Patient Instructions (Signed)
Medication Instructions:  NO CHANGE If you need a refill on your cardiac medications before your next appointment, please call your pharmacy.   Lab work: If you have labs (blood work) drawn today and your tests are completely normal, you will receive your results only by: Marland Kitchen MyChart Message (if you have MyChart) OR . A paper copy in the mail If you have any lab test that is abnormal or we need to change your treatment, we will call you to review the results.  Testing/Procedures: Your physician has requested that you have a lower extremity venous duplex. This test is an ultrasound of the veins in the legs or arms. It looks at venous blood flow that carries blood from the heart to the legs or arms. Allow one hour for a Lower Venous exam. Allow thirty minutes for an Upper Venous exam. There are no restrictions or special instructions.    Follow-Up: At [facility], you and your health needs are our priority.  As part of our continuing mission to provide you with exceptional heart care, we have created designated Provider Care Teams.  These Care Teams include your primary Cardiologist (physician) and Advanced Practice Providers (APPs -  Physician Assistants and Nurse Practitioners) who all work together to provide you with the care you need, when you need it. Your physician recommends that you schedule a follow-up appointment in: [place]

## 2019-01-02 ENCOUNTER — Ambulatory Visit (INDEPENDENT_AMBULATORY_CARE_PROVIDER_SITE_OTHER): Payer: Medicare Other | Admitting: Cardiology

## 2019-01-02 ENCOUNTER — Ambulatory Visit (HOSPITAL_COMMUNITY)
Admission: RE | Admit: 2019-01-02 | Discharge: 2019-01-02 | Disposition: A | Discharge status: Home / Self Care | Payer: Medicare Other | Source: Ambulatory Visit | Attending: Cardiology | Admitting: Cardiology

## 2019-01-02 ENCOUNTER — Encounter: Payer: Self-pay | Admitting: Cardiology

## 2019-01-02 ENCOUNTER — Other Ambulatory Visit: Payer: Self-pay

## 2019-01-02 VITALS — BP 112/62 | HR 71 | Resp 12 | Ht 66.0 in | Wt 152.8 lb

## 2019-01-02 DIAGNOSIS — I10 Essential (primary) hypertension: Secondary | ICD-10-CM

## 2019-01-02 DIAGNOSIS — I4891 Unspecified atrial fibrillation: Secondary | ICD-10-CM | POA: Diagnosis not present

## 2019-01-02 DIAGNOSIS — R600 Localized edema: Secondary | ICD-10-CM

## 2019-01-02 DIAGNOSIS — I5042 Chronic combined systolic (congestive) and diastolic (congestive) heart failure: Secondary | ICD-10-CM

## 2019-01-02 NOTE — Patient Instructions (Signed)
Medication Instructions:  NO CHANGE If you need a refill on your cardiac medications before your next appointment, please call your pharmacy.   Lab work: If you have labs (blood work) drawn today and your tests are completely normal, you will receive your results only by: Marland Kitchen MyChart Message (if you have MyChart) OR . A paper copy in the mail If you have any lab test that is abnormal or we need to change your treatment, we will call you to review the results.  Follow-Up: At [facility], you and your health needs are our priority.  As part of our continuing mission to provide you with exceptional heart care, we have created designated Provider Care Teams.  These Care Teams include your primary Cardiologist (physician) and Advanced Practice Providers (APPs -  Physician Assistants and Nurse Practitioners) who all work together to provide you with the care you need, when you need it. You will need a follow up appointment in 6 months.  Please call our office 2 months in advance to schedule this appointment.  You may see Kirk Ruths MD or one of the following Advanced Practice Providers on your designated Care Team:   Kerin Ransom, PA-C Roby Lofts, [place] . Sande Rives, PA-C

## 2019-01-13 DIAGNOSIS — H401134 Primary open-angle glaucoma, bilateral, indeterminate stage: Secondary | ICD-10-CM | POA: Diagnosis not present

## 2019-01-13 DIAGNOSIS — H04123 Dry eye syndrome of bilateral lacrimal glands: Secondary | ICD-10-CM | POA: Diagnosis not present

## 2019-01-14 ENCOUNTER — Encounter: Payer: Self-pay | Admitting: Primary Care

## 2019-01-14 ENCOUNTER — Ambulatory Visit (INDEPENDENT_AMBULATORY_CARE_PROVIDER_SITE_OTHER): Payer: Medicare Other | Admitting: Primary Care

## 2019-01-14 ENCOUNTER — Other Ambulatory Visit: Payer: Self-pay

## 2019-01-14 DIAGNOSIS — J449 Chronic obstructive pulmonary disease, unspecified: Secondary | ICD-10-CM

## 2019-01-14 NOTE — Progress Notes (Signed)
Virtual Visit via Telephone Note  I connected with [name] on 01/14/19 at  1:30 PM EDT by telephone and verified that I am speaking with the correct person using two identifiers.  Location: Patient: Home Provider: Office   I discussed the limitations, risks, security and privacy concerns of performing an evaluation and management service by telephone and the availability of in person appointments. I also discussed with the patient that there may be a patient responsible charge related to this service. The patient expressed understanding and agreed to proceed.   History of Present Illness: 84 year old female, former smoker quit in 2004 (40 pack year hx). PMH significant for COPD, chronic respiratory failure with hypoxia (2L oxygen), HTN, afib (not anticoagulated), chronic combined systolic and diastolic congestive heart failure, breast cancer s/p resection, recurrent bladder cancer s/p TURP/chemo. Patient of Dr. Elsworth Soho, seen for initial consult on 02/18/18. Admitted in April 2019 for COPD exacerbation. Maintained on Pulmicort and Brovana nebulizer twice daily. Up today with Pneumovax and Prevnar.   01/14/2019 Contacted today by phone call for 3 month follow-up visit, she was not able to speak for long d/t currently moving. She is doing well, breathing is baseline. No recent exacerbations of her COPD. Only using Brovana/Pulmicort once a day because she has been so busy. She does feel her breathing is better when she uses her nebulizer's twice a day. Reports left knee swelling, ultrasound negative for DVT. She plans to follow up with her PCP regarding this and a rash on her ankles. Denies fever, cough or wheeze.   Observations/Objective: - No significant shortness of breath, wheezing or cough observed during phone conversation  Assessment and Plan:  COPD: - Stable interval; no recent exacerbations of her symptoms  - Continue Brovana twice daily - Continue Pulmicort twice daily  -  Continue Atrovent twice daily  - PRN albuterol/proventil hfa q6 hours for sob/wheezing - Reinforced importance of taking nebulizer as prescribed  Follow Up Instructions:  - July/August with Dr. Elsworth Soho  I discussed the assessment and treatment plan with the patient. The patient was provided an opportunity to ask questions and all were answered. The patient agreed with the plan and demonstrated an understanding of the instructions.   The patient was advised to call back or seek an in-person evaluation if the symptoms worsen or if the condition fails to improve as anticipated.  I provided 15 minutes of non-face-to-face time during this encounter.   Martyn Ehrich, NP

## 2019-01-14 NOTE — Patient Instructions (Addendum)
Continue Brovana/Pulmicort/Atrovent nebulizer twice daily Use Albuterol/Proventil rescue inhaler every 6 hours for sob/wheezing   Follow up: - July/August with Dr. Elsworth Soho or sooner if symptoms worsen

## 2019-03-06 ENCOUNTER — Other Ambulatory Visit: Payer: Self-pay | Admitting: Pulmonary Disease

## 2019-03-11 DIAGNOSIS — B0052 Herpesviral keratitis: Secondary | ICD-10-CM | POA: Diagnosis not present

## 2019-03-11 DIAGNOSIS — H43811 Vitreous degeneration, right eye: Secondary | ICD-10-CM | POA: Diagnosis not present

## 2019-03-17 ENCOUNTER — Other Ambulatory Visit: Payer: Self-pay | Admitting: Cardiology

## 2019-03-18 DIAGNOSIS — B0052 Herpesviral keratitis: Secondary | ICD-10-CM | POA: Diagnosis not present

## 2019-03-20 ENCOUNTER — Other Ambulatory Visit: Payer: Self-pay

## 2019-03-24 ENCOUNTER — Other Ambulatory Visit: Payer: Self-pay

## 2019-03-24 ENCOUNTER — Ambulatory Visit (INDEPENDENT_AMBULATORY_CARE_PROVIDER_SITE_OTHER): Payer: Medicare Other | Admitting: Obstetrics & Gynecology

## 2019-03-24 ENCOUNTER — Encounter: Payer: Self-pay | Admitting: Obstetrics & Gynecology

## 2019-03-24 ENCOUNTER — Telehealth: Payer: Self-pay | Admitting: *Deleted

## 2019-03-24 VITALS — BP 92/52 | HR 80 | Temp 97.1°F | Ht 66.0 in | Wt 149.0 lb

## 2019-03-24 DIAGNOSIS — Z01419 Encounter for gynecological examination (general) (routine) without abnormal findings: Secondary | ICD-10-CM | POA: Diagnosis not present

## 2019-03-24 NOTE — Progress Notes (Signed)
84 y.o. G8P4 Married White or Caucasian female here for annual exam.  Having some trouble with breathing today due to heat and humidity.  Denies vaginal bleeding.  She has a left breast lump that she wants me to check today.  Has MMG scheduled in December.  Area she is feeling is not tender.  She downsized this past year.  Still trying to unpack her things.  It is such a "job" at this time in her life.    Did pulmonary rehab this past year.    Patient's last menstrual period was 12/03/2002.          Sexually active: No.  The current method of family planning is tubal ligation.    Exercising: No.   Smoker:  no  Health Maintenance: Pap:  09/27/16 neg   01/01/14 neg  History of abnormal Pap:  no MMG:  08/18/18 BIRADS1:neg  Colonoscopy:  11/13/12 polyps. No f/u needed  BMD:   2015 TDaP:  2017  Pneumonia vaccine(s):  2015 Shingrix:   Zostavax 2009 Hep C testing: n/a Screening Labs: PCP   reports that she quit smoking about 16 years ago. Her smoking use included cigarettes. She has a 40.00 pack-year smoking history. She has never used smokeless tobacco. She reports that she does not drink alcohol or use drugs.  Past Medical History:  Diagnosis Date  . Arthritis   . COPD (chronic obstructive pulmonary disease) ([place])   . Dyspnea on exertion   . GERD (gastroesophageal reflux disease)   . Glaucoma of both eyes   . History of atrial fibrillation without current medication 2010   POST SURG 2010--  PER PT NO ISSUES SINCE  . History of breast cancer    DX DCIS IN 2004--  S/P RIGHT MASTECTOMY , NO CHEMORADIATION---  NO RECURRENCE  . History of CHF (congestive heart failure)    POST SURG 2010  . History of hypertension   . History of shingles    02/ 2015--  back of neck and left flank--  no residual pain  . ICH (intracerebral hemorrhage) ([place]) 02/05/2015  . Nephrolithiasis   . Recurrent bladder papillary carcinoma ([place]) first dx 06/ 2014   s/p  turbt's  and instillation mitomycin c  (chemo)//dx again in 2015-different type    Past Surgical History:  Procedure Laterality Date  . CATARACT EXTRACTION W/ INTRAOCULAR LENS  IMPLANT, BILATERAL    . CRANIECTOMY N/A 02/05/2015   Procedure: SUBOCCIPITAL CRANIECTOMY EVACUATION OF HEMATOMA;  Surgeon: Consuella Lose, MD;  Location: [facility];  Service: Neurosurgery;  Laterality: N/A;  . CYSTOSCOPY W/ RETROGRADES Bilateral 08/11/2014   Procedure: CYSTOSCOPY WITH BILATERAL RETROGRADE PYELOGRAM AND MITOMYCIN INSTILLATION;  Surgeon: Alexis Frock, MD;  Location[facility];  Service: Urology;  Laterality: Bilateral;  . CYSTOSCOPY W/ URETERAL STENT PLACEMENT Bilateral 03/26/2013   Procedure: CYSTOSCOPY WITH BILATERAL RETROGRADE PYELOGRAM/URETERAL STENT PLACEMENT;  Surgeon: Alexis Frock, MD;  Location[facility];  Service: Urology;  Laterality: Bilateral;  . CYSTOSCOPY W/ URETERAL STENT PLACEMENT Left 05/13/2013   Procedure: CYSTOSCOPY WITH RETROGRADE PYELOGRAM/URETERAL STENT PLACEMENT STENT EXCHANGE;  Surgeon: Alexis Frock, MD;  Location[facility];  Service: Urology;  Laterality: Left;  . PARTIAL MASTECTOMY WITH NEEDLE LOCALIZATION Right 01-14-2003   DCIS  . REMOVAL CYST LEFT HAND  2013  . TOTAL KNEE ARTHROPLASTY Right 03-22-2009  . TOTAL MASTECTOMY Right 02-03-2003   W/ SLN BX  AND  POST 03-01-2003 EVACUATION HEMATOMA  . TRANSTHORACIC ECHOCARDIOGRAM  03-25-2009  MILD LVF/ EF 70-80%/ MILD INCREASE SYSTOLIC PULMONARY  PRESSURE  . TRANSURETHRAL RESECTION OF BLADDER TUMOR WITH GYRUS (TURBT-GYRUS) N/A 03/26/2013   Procedure: TRANSURETHRAL RESECTION OF BLADDER TUMOR WITH GYRUS (TURBT-GYRUS);  Surgeon: Alexis Frock, MD;  Location: [facility];  Service: Urology;  Laterality: N/A;  . TRANSURETHRAL RESECTION OF BLADDER TUMOR WITH GYRUS (TURBT-GYRUS) N/A 05/13/2013   Procedure: TRANSURETHRAL RESECTION OF BLADDER TUMOR WITH GYRUS (TURBT-GYRUS)  RE-STAGING TRANSURETHRAL  RESECTION OF BLADDER TUMOR, LEFT RETROGRADE PYELOGRAM AND STENT EXCHANGE;  Surgeon: Alexis Frock, MD;  Location: [facility];  Service: Urology;  Laterality: N/A;  . TRANSURETHRAL RESECTION OF BLADDER TUMOR WITH GYRUS (TURBT-GYRUS) N/A 08/11/2014   Procedure: TRANSURETHRAL RESECTION OF BLADDER TUMOR WITH GYRUS (TURBT-GYRUS);  Surgeon: Alexis Frock, MD;  Location: [facility];  Service: Urology;  Laterality: N/A;  . TUBAL LIGATION      Current Outpatient Medications  Medication Sig Dispense Refill  . albuterol (PROVENTIL HFA;VENTOLIN HFA) 108 (90 Base) MCG/ACT inhaler Inhale 2 puffs into the lungs every 6 (six) hours as needed for wheezing or shortness of breath. 1 Inhaler 2  . arformoterol (BROVANA) 15 MCG/2ML NEBU USE 1 VIAL  IN  NEBULIZER TWICE  DAILY - (Morning and Evening) 60 mL 11  . atorvastatin (LIPITOR) 10 MG tablet TAKE ONE TABLET DAILY AT 6PM 90 tablet 1  . budesonide (PULMICORT) 0.5 MG/2ML nebulizer solution 2ML BY NEBULIZER TWICE DAILY 120 mL 5  . digoxin (LANOXIN) 0.125 MG tablet TAKE ONE TABLET DAILY 90 tablet 0  . diltiazem (CARDIZEM CD) 240 MG 24 hr capsule TAKE ONE CAPSULE EACH DAY 90 capsule 3  . fluorometholone (FML) 0.1 % ophthalmic suspension     . ipratropium (ATROVENT) 0.02 % nebulizer solution USE 2.46m BY NEBULIZER TWICE DAILY 150 mL 2  . latanoprost (XALATAN) 0.005 % ophthalmic solution Place 1 drop into both eyes at bedtime. Reported on 11/09/2015    . OXYGEN Inhale into the lungs.    . pantoprazole (PROTONIX) 40 MG tablet TAKE ONE TABLET DAILY 90 tablet 1  . SIMBRINZA 1-0.2 % SUSP Place 1 drop into both eyes 2 (two) times daily.   99  . valACYclovir (VALTREX) 1000 MG tablet Take 1 tablet by mouth 3 (three) times daily as needed.     No current facility-administered medications for this visit.     Family History  Problem Relation Age of Onset  . Colon cancer Mother 644 . Colon cancer Maternal Grandmother   . Stroke Maternal  Grandmother   . Breast cancer Maternal Aunt 474 . Cancer Maternal Aunt        ? ovarian  . Breast cancer Other 60       breast cancer, ovarian cancer  . Breast cancer Daughter 435 . Stroke Paternal Grandmother   . Heart attack Neg Hx   . Hypertension Neg Hx     Review of Systems  All other systems reviewed and are negative.   Exam:   BP (!) 92/52   Pulse 80   Temp (!) 97.1 F (36.2 C) (Temporal)   Ht _0  (1.676 m)   Wt 149 lb (67.6 kg)   LMP 12/03/2002   BMI 24.05 kg/m    Height: _1  (167.6 cm)  Ht Readings from Last 3 Encounters:  03/24/19 _2  (1.676 m)  01/02/19 _3  (1.676 m)  01/01/19 _4  (1.676 m)    General appearance: alert, cooperative and appears stated age Head: Normocephalic, without  obvious abnormality, atraumatic Neck: no adenopathy, supple, symmetrical, trachea midline and thyroid normal to inspection and palpation Breasts: absent right breast without masses or skin changes; left breast with 3-22m area of firmness that feels like calcificatoin collection, the anterior portion of her breast is thickened as well Heart: regular rate and rhythm Abdomen: soft, non-tender; bowel sounds normal; no masses,  no organomegaly Extremities: extremities normal, atraumatic, no cyanosis or edema Skin: Skin color, texture, turgor normal. No rashes or lesions Lymph nodes: Cervical, supraclavicular, and axillary nodes normal. No abnormal inguinal nodes palpated Neurologic: Grossly normal   Pelvic: External genitalia:  no lesions              Urethra:  normal appearing urethra with no masses, tenderness or lesions              Bartholins and Skenes: normal                 Vagina: normal appearing vagina with normal color and discharge, no lesions              Cervix: no lesions              Pap taken: No. Bimanual Exam:  Uterus:  normal size, contour, position, consistency, mobility, non-tender              Adnexa: normal adnexa and no mass, fullness,  tenderness               Rectovaginal: Confirms               Anus:  normal sphincter tone, no lesions  Chaperone was present for exam.  A:  Well Woman with normal exam PMP, no HRT H/o bladder cancer 2014, followed by Dr. MTammi KlippelH/o [place] afib, COPD Breast thickened and very firm area that feels like calcifications Family hx of barest cancer with daughter who had negative genetic testing H/O cerebellar hemorrhage with hematoma evlauation with associated stoke 6/16  P:   [facility] pap smears no longer indicated due to age Lab work is done with Dr. JRonnald RampVaccines UTD except Shingrix.  Had done Zostavax and declines having new vaccine utdate Return annually or prn

## 2019-03-24 NOTE — Telephone Encounter (Signed)
Spoke with Vaughan Basta at [place], was advised first available appt 8/4. RN will contact [place] location for scheudling.   Call placed to [facility] at [place], Left message to call Sharee Pimple, RN at [facility] [phone] to assist with scheduling.

## 2019-03-24 NOTE — Telephone Encounter (Signed)
Spoke with [name] at [place]. Left breast Dx MMG and [place], if needed at [facility] on 03/30/19 at 7:45am. Order faxed to [facility].    Call placed to patient, patient is agreeable to date and time.  Placed in [place] hold.   Routing to provider for final review. Patient is agreeable to disposition. Will close encounter.

## 2019-03-25 ENCOUNTER — Other Ambulatory Visit: Payer: Self-pay | Admitting: Cardiology

## 2019-03-30 DIAGNOSIS — N6321 Unspecified lump in the left breast, upper outer quadrant: Secondary | ICD-10-CM | POA: Diagnosis not present

## 2019-03-30 DIAGNOSIS — Z853 Personal history of malignant neoplasm of breast: Secondary | ICD-10-CM | POA: Diagnosis not present

## 2019-03-30 LAB — HM MAMMOGRAPHY

## 2019-04-01 ENCOUNTER — Other Ambulatory Visit: Payer: Self-pay | Admitting: Radiology

## 2019-04-01 ENCOUNTER — Encounter: Payer: Self-pay | Admitting: Obstetrics & Gynecology

## 2019-04-01 ENCOUNTER — Encounter: Payer: Self-pay | Admitting: Internal Medicine

## 2019-04-01 DIAGNOSIS — C50412 Malignant neoplasm of upper-outer quadrant of left female breast: Secondary | ICD-10-CM | POA: Diagnosis not present

## 2019-04-01 DIAGNOSIS — N6321 Unspecified lump in the left breast, upper outer quadrant: Secondary | ICD-10-CM | POA: Diagnosis not present

## 2019-04-01 DIAGNOSIS — B0052 Herpesviral keratitis: Secondary | ICD-10-CM | POA: Diagnosis not present

## 2019-04-02 ENCOUNTER — Telehealth: Payer: Self-pay | Admitting: Hematology

## 2019-04-02 ENCOUNTER — Encounter: Payer: Self-pay | Admitting: *Deleted

## 2019-04-02 NOTE — Telephone Encounter (Signed)
Spoke to patient to confirm afternoon [facility] appointment for 8/5, letter sent

## 2019-04-03 ENCOUNTER — Encounter: Payer: Self-pay | Admitting: *Deleted

## 2019-04-03 ENCOUNTER — Other Ambulatory Visit: Payer: Self-pay | Admitting: *Deleted

## 2019-04-03 DIAGNOSIS — Z170 Estrogen receptor positive status [ER+]: Secondary | ICD-10-CM | POA: Insufficient documentation

## 2019-04-03 DIAGNOSIS — C50412 Malignant neoplasm of upper-outer quadrant of left female breast: Secondary | ICD-10-CM

## 2019-04-06 ENCOUNTER — Encounter: Payer: Self-pay | Admitting: General Surgery

## 2019-04-07 ENCOUNTER — Encounter: Payer: Self-pay | Admitting: Internal Medicine

## 2019-04-08 ENCOUNTER — Ambulatory Visit (HOSPITAL_BASED_OUTPATIENT_CLINIC_OR_DEPARTMENT_OTHER): Payer: Medicare Other | Admitting: Licensed Clinical Social Worker

## 2019-04-08 ENCOUNTER — Ambulatory Visit
Admission: RE | Admit: 2019-04-08 | Discharge: 2019-04-08 | Disposition: A | Discharge status: Home / Self Care | Payer: Medicare Other | Source: Ambulatory Visit | Attending: Radiation Oncology | Admitting: Radiation Oncology

## 2019-04-08 ENCOUNTER — Encounter: Payer: Self-pay | Admitting: Hematology

## 2019-04-08 ENCOUNTER — Ambulatory Visit: Payer: Medicare Other | Admitting: Physical Therapy

## 2019-04-08 ENCOUNTER — Inpatient Hospital Stay: Payer: Medicare Other

## 2019-04-08 ENCOUNTER — Other Ambulatory Visit: Payer: Self-pay

## 2019-04-08 ENCOUNTER — Encounter: Payer: Self-pay | Admitting: Licensed Clinical Social Worker

## 2019-04-08 ENCOUNTER — Inpatient Hospital Stay: Payer: Medicare Other | Attending: Hematology | Admitting: Hematology

## 2019-04-08 VITALS — BP 106/70 | HR 63 | Temp 98.2°F | Resp 18 | Ht 66.0 in | Wt 149.6 lb

## 2019-04-08 DIAGNOSIS — Z9981 Dependence on supplemental oxygen: Secondary | ICD-10-CM | POA: Insufficient documentation

## 2019-04-08 DIAGNOSIS — Z8041 Family history of malignant neoplasm of ovary: Secondary | ICD-10-CM

## 2019-04-08 DIAGNOSIS — Z86000 Personal history of in-situ neoplasm of breast: Secondary | ICD-10-CM | POA: Diagnosis not present

## 2019-04-08 DIAGNOSIS — C50412 Malignant neoplasm of upper-outer quadrant of left female breast: Secondary | ICD-10-CM | POA: Diagnosis not present

## 2019-04-08 DIAGNOSIS — Z853 Personal history of malignant neoplasm of breast: Secondary | ICD-10-CM | POA: Diagnosis not present

## 2019-04-08 DIAGNOSIS — I509 Heart failure, unspecified: Secondary | ICD-10-CM | POA: Diagnosis not present

## 2019-04-08 DIAGNOSIS — C672 Malignant neoplasm of lateral wall of bladder: Secondary | ICD-10-CM

## 2019-04-08 DIAGNOSIS — Z809 Family history of malignant neoplasm, unspecified: Secondary | ICD-10-CM

## 2019-04-08 DIAGNOSIS — Z86011 Personal history of benign neoplasm of the brain: Secondary | ICD-10-CM

## 2019-04-08 DIAGNOSIS — Z170 Estrogen receptor positive status [ER+]: Secondary | ICD-10-CM | POA: Insufficient documentation

## 2019-04-08 DIAGNOSIS — I10 Essential (primary) hypertension: Secondary | ICD-10-CM

## 2019-04-08 DIAGNOSIS — I482 Chronic atrial fibrillation, unspecified: Secondary | ICD-10-CM | POA: Diagnosis not present

## 2019-04-08 DIAGNOSIS — Z8042 Family history of malignant neoplasm of prostate: Secondary | ICD-10-CM | POA: Diagnosis not present

## 2019-04-08 DIAGNOSIS — B029 Zoster without complications: Secondary | ICD-10-CM | POA: Diagnosis not present

## 2019-04-08 DIAGNOSIS — Z800 Family history of malignant neoplasm of digestive organs: Secondary | ICD-10-CM

## 2019-04-08 DIAGNOSIS — D0510 Intraductal carcinoma in situ of unspecified breast: Secondary | ICD-10-CM | POA: Diagnosis not present

## 2019-04-08 DIAGNOSIS — I4891 Unspecified atrial fibrillation: Secondary | ICD-10-CM | POA: Diagnosis not present

## 2019-04-08 DIAGNOSIS — Z8673 Personal history of transient ischemic attack (TIA), and cerebral infarction without residual deficits: Secondary | ICD-10-CM | POA: Diagnosis not present

## 2019-04-08 DIAGNOSIS — Z87891 Personal history of nicotine dependence: Secondary | ICD-10-CM | POA: Diagnosis not present

## 2019-04-08 DIAGNOSIS — Z803 Family history of malignant neoplasm of breast: Secondary | ICD-10-CM | POA: Insufficient documentation

## 2019-04-08 DIAGNOSIS — M1990 Unspecified osteoarthritis, unspecified site: Secondary | ICD-10-CM | POA: Insufficient documentation

## 2019-04-08 DIAGNOSIS — Z8679 Personal history of other diseases of the circulatory system: Secondary | ICD-10-CM | POA: Diagnosis not present

## 2019-04-08 DIAGNOSIS — J449 Chronic obstructive pulmonary disease, unspecified: Secondary | ICD-10-CM | POA: Diagnosis not present

## 2019-04-08 DIAGNOSIS — Z9011 Acquired absence of right breast and nipple: Secondary | ICD-10-CM | POA: Diagnosis not present

## 2019-04-08 DIAGNOSIS — K219 Gastro-esophageal reflux disease without esophagitis: Secondary | ICD-10-CM | POA: Diagnosis not present

## 2019-04-08 DIAGNOSIS — Z8551 Personal history of malignant neoplasm of bladder: Secondary | ICD-10-CM

## 2019-04-08 DIAGNOSIS — C679 Malignant neoplasm of bladder, unspecified: Secondary | ICD-10-CM | POA: Diagnosis not present

## 2019-04-08 LAB — CMP (CANCER CENTER ONLY)
ALT: 31 U/L (ref 0–44)
AST: 26 U/L (ref 15–41)
Albumin: 3.7 g/dL (ref 3.5–5.0)
Alkaline Phosphatase: 132 U/L — ABNORMAL HIGH (ref 38–126)
Anion gap: 10 (ref 5–15)
BUN: 18 mg/dL (ref 8–23)
CO2: 27 mmol/L (ref 22–32)
Calcium: 8.9 mg/dL (ref 8.9–10.3)
Chloride: 106 mmol/L (ref 98–111)
Creatinine: 0.83 mg/dL (ref 0.44–1.00)
GFR, Est AFR Am: >60 mL/min (ref >60)
GFR, Estimated: >60 mL/min (ref >60)
Glucose, Bld: 85 mg/dL (ref 70–99)
Potassium: 3.9 mmol/L (ref 3.5–5.1)
Sodium: 143 mmol/L (ref 135–145)
Total Bilirubin: 0.6 mg/dL (ref 0.3–1.2)
Total Protein: 6.7 g/dL (ref 6.5–8.1)

## 2019-04-08 LAB — CBC WITH DIFFERENTIAL (CANCER CENTER ONLY)
Abs Immature Granulocytes: 0.02 10*3/uL (ref 0.00–0.07)
Basophils Absolute: 0.1 10*3/uL (ref 0.0–0.1)
Basophils Relative: 1 %
Eosinophils Absolute: 0.1 10*3/uL (ref 0.0–0.5)
Eosinophils Relative: 2 %
HCT: 43.6 % (ref 36.0–46.0)
Hemoglobin: 13.9 g/dL (ref 12.0–15.0)
Immature Granulocytes: 0 %
Lymphocytes Relative: 19 %
Lymphs Abs: 1.7 10*3/uL (ref 0.7–4.0)
MCH: 32.5 pg (ref 26.0–34.0)
MCHC: 31.9 g/dL (ref 30.0–36.0)
MCV: 101.9 fL — ABNORMAL HIGH (ref 80.0–100.0)
Monocytes Absolute: 0.8 10*3/uL (ref 0.1–1.0)
Monocytes Relative: 9 %
Neutro Abs: 6 10*3/uL (ref 1.7–7.7)
Neutrophils Relative %: 69 %
Platelet Count: 223 10*3/uL (ref 150–400)
RBC: 4.28 MIL/uL (ref 3.87–5.11)
RDW: 15.3 % (ref 11.5–15.5)
WBC Count: 8.6 10*3/uL (ref 4.0–10.5)
nRBC: 0 % (ref 0.0–0.2)

## 2019-04-08 NOTE — Progress Notes (Signed)
[place]   [phone] Fax:([phone]) [place] Note   Patient Care Team: Janith Lima, MD as PCP - General (Internal Medicine) Rockwell Germany, RN as Oncology Nurse Navigator Mauro Kaufmann, RN as Oncology Nurse Navigator Truitt Merle, MD as Consulting Physician (Hematology) Fanny Skates, MD as Consulting Physician (General Surgery) Kyung Rudd, MD as Consulting Physician (Radiation Oncology) Alexis Frock, MD as Consulting Physician (Anesthesiology) Alexis Frock, MD as Consulting Physician (Urology) [name], MD as Consulting Physician (Cardiology) Rigoberto Noel, MD as Consulting Physician (Pulmonary Disease)  Date of Service:  04/08/2019   CHIEF COMPLAINTS/PURPOSE OF CONSULTATION:  Newly diagnosed malignant neoplasm of upper-outer quadrant of left breast    Oncology History  Malignant neoplasm of upper-outer quadrant of left breast in female, estrogen receptor positive ([place])  03/30/2019 Mammogram   Left diagnostic mammogram and [place] 03/30/19  IMPRESSION The 1.6cm heterogenous focal area in the left breast at 1:00-2:00 4-6cm form the nipple, corresponding to the hard palpable area in the left breast is suspicious of malignancy, particularly lobular carcinoma.     04/01/2019 Cancer Staging   Staging form: Breast, AJCC 8th Edition - Clinical stage from 04/01/2019: Stage IA (cT1c, cN0, cM0, G2, ER+, PR+, HER2-) - Signed by Truitt Merle, MD on 04/07/2019   04/01/2019 Initial Biopsy   Diagnosis 04/01/19 Breast, left, needle core biopsy, 2 o'clock, 5cmfn - INVASIVE MAMMARY CARCINOMA, SEE COMMENT. - MAMMARY CARCINOMA IN SITU.   04/01/2019 Receptors her2   The tumor cells are NEGATIVE for Her2 (1+). Estrogen Receptor: 100%, POSITIVE, STRONG STAINING INTENSITY Progesterone Receptor: 30%, POSITIVE, STRONG STAINING INTENSITY Proliferation Marker Ki67: 20%   04/03/2019 Initial Diagnosis   Malignant neoplasm of upper-outer quadrant  of left breast in female, estrogen receptor positive ([place])      HISTORY OF PRESENTING ILLNESS:  [name] 84 y.o. female is a here because of newly diagnosed left breast cancer. The patient presents to the clinic today alone.   She felt left breast lump herself in December 2019. This has not been painful to her. When she saw her Gyn for check up it was felt and Mammogram and biopsy was recommended. Results showed cancer.  After biopsy she only has mild soreness of left breast. She notes her appetite and eating is adequate. She has someone to do her housework.  Today she notes recurrent eye issues, She is on eye drops currently. She is also on valtrex once daily for her shingles.   Socially she is married with 4 adult children. She has 2 children in [place], 1 in [place] and another in [place]. She ambulates with cane at home. Given her husband still works as an Forensic psychologist, she is alone at home often. She still drives and long distance.  She has COPD, on oxygen, HTN, Afib, GERD, Arthritis. She has a h/o stroke, s/p craniotomy of cerebellum from [place], Recurrent bladder papillary carcinoma, H/o right breast cancer, HTN, Afib.  Her mother had colon cancer. Her daughter had breast cancer.    GYN HISTORY  Menarchal: 15 LMP: 2004  Contraceptive: No HRT: Yes, stopped in 2004 with right breast cancer G4P4, first at age 20 yo    REVIEW OF SYSTEMS:    Constitutional: Denies fevers, chills or abnormal night sweats Eyes: Denies blurriness of vision, double vision or watery eyes Ears, nose, mouth, throat, and face: Denies mucositis or sore throat Respiratory: Denies cough, dyspnea or wheezes Cardiovascular: Denies palpitation, chest discomfort or  lower extremity swelling Gastrointestinal:  Denies nausea, heartburn or change in bowel habits Skin: Denies abnormal skin rashes Lymphatics: Denies new lymphadenopathy or easy bruising Neurological:Denies numbness, tingling or new  weaknesses Behavioral/Psych: Mood is stable, no new changes  All other systems were reviewed with the patient and are negative.   MEDICAL HISTORY:  Past Medical History:  Diagnosis Date  . Arthritis   . COPD (chronic obstructive pulmonary disease) ([place])   . Dyspnea on exertion   . Family history of breast cancer   . Family history of colon cancer   . Family history of ovarian cancer   . Family history of prostate cancer   . GERD (gastroesophageal reflux disease)   . Glaucoma of both eyes   . History of atrial fibrillation without current medication 2010   POST SURG 2010--  PER PT NO ISSUES SINCE  . History of breast cancer    DX DCIS IN 2004--  S/P RIGHT MASTECTOMY , NO CHEMORADIATION---  NO RECURRENCE  . History of CHF (congestive heart failure)    POST SURG 2010  . History of hypertension   . History of shingles    02/ 2015--  back of neck and left flank--  no residual pain  . ICH (intracerebral hemorrhage) ([place]) 02/05/2015  . Nephrolithiasis   . Recurrent bladder papillary carcinoma ([place]) first dx 06/ 2014   s/p  turbt's  and instillation mitomycin c (chemo)//dx again in 2015-different type  . Stroke [facility])     SURGICAL HISTORY: Past Surgical History:  Procedure Laterality Date  . CATARACT EXTRACTION W/ INTRAOCULAR LENS  IMPLANT, BILATERAL    . CRANIECTOMY N/A 02/05/2015   Procedure: SUBOCCIPITAL CRANIECTOMY EVACUATION OF HEMATOMA;  Surgeon: Consuella Lose, MD;  Location: [facility];  Service: Neurosurgery;  Laterality: N/A;  . CYSTOSCOPY W/ RETROGRADES Bilateral 08/11/2014   Procedure: CYSTOSCOPY WITH BILATERAL RETROGRADE PYELOGRAM AND MITOMYCIN INSTILLATION;  Surgeon: Alexis Frock, MD;  Location: [facility];  Service: Urology;  Laterality: Bilateral;  . CYSTOSCOPY W/ URETERAL STENT PLACEMENT Bilateral 03/26/2013   Procedure: CYSTOSCOPY WITH BILATERAL RETROGRADE PYELOGRAM/URETERAL STENT PLACEMENT;  Surgeon: Alexis Frock, MD;  Location: [facility];  Service: Urology;  Laterality: Bilateral;  . CYSTOSCOPY W/ URETERAL STENT PLACEMENT Left 05/13/2013   Procedure: CYSTOSCOPY WITH RETROGRADE PYELOGRAM/URETERAL STENT PLACEMENT STENT EXCHANGE;  Surgeon: Alexis Frock, MD;  Location: [facility];  Service: Urology;  Laterality: Left;  . PARTIAL MASTECTOMY WITH NEEDLE LOCALIZATION Right 01-14-2003   DCIS  . REMOVAL CYST LEFT HAND  2013  . TOTAL KNEE ARTHROPLASTY Right 03-22-2009  . TOTAL MASTECTOMY Right 02-03-2003   W/ SLN BX  AND  POST 03-01-2003 EVACUATION HEMATOMA  . TRANSTHORACIC ECHOCARDIOGRAM  03-25-2009   MILD LVF/ EF 70-80%/ MILD INCREASE SYSTOLIC PULMONARY  PRESSURE  . TRANSURETHRAL RESECTION OF BLADDER TUMOR WITH GYRUS (TURBT-GYRUS) N/A 03/26/2013   Procedure: TRANSURETHRAL RESECTION OF BLADDER TUMOR WITH GYRUS (TURBT-GYRUS);  Surgeon: Alexis Frock, MD;  Location[facility];  Service: Urology;  Laterality: N/A;  . TRANSURETHRAL RESECTION OF BLADDER TUMOR WITH GYRUS (TURBT-GYRUS) N/A 05/13/2013   Procedure: TRANSURETHRAL RESECTION OF BLADDER TUMOR WITH GYRUS (TURBT-GYRUS)  RE-STAGING TRANSURETHRAL RESECTION OF BLADDER TUMOR, LEFT RETROGRADE PYELOGRAM AND STENT EXCHANGE;  Surgeon: Alexis Frock, MD;  Location: [facility];  Service: Urology;  Laterality: N/A;  . TRANSURETHRAL RESECTION OF BLADDER TUMOR WITH GYRUS (TURBT-GYRUS) N/A 08/11/2014   Procedure: TRANSURETHRAL RESECTION OF BLADDER TUMOR WITH GYRUS (TURBT-GYRUS);  Surgeon: Alexis Frock, MD;  Location: [place];  Service: Urology;  Laterality: N/A;  . TUBAL LIGATION      SOCIAL HISTORY: Social History   Socioeconomic History  . Marital status: Married    Spouse name: Not on file  . Number of children: 4  . Years of education: Not on file  . Highest education level: Not on file  Occupational History  . Not on file  Social Needs  . Financial resource strain: Not on file  . Food insecurity     Worry: Not on file    Inability: Not on file  . Transportation needs    Medical: Not on file    Non-medical: Not on file  Tobacco Use  . Smoking status: Former Smoker    Packs/day: 1.00    Years: 40.00    Pack years: 40.00    Types: Cigarettes    Quit date: 03/20/2003    Years since quitting: 16.0  . Smokeless tobacco: Never Used  Substance and Sexual Activity  . Alcohol use: No    Alcohol/week: 0.0 standard drinks  . Drug use: No  . Sexual activity: Not Currently    Partners: Male    Birth control/protection: Surgical    Comment: BTL  Lifestyle  . Physical activity    Days per week: Not on file    Minutes per session: Not on file  . Stress: Not on file  Relationships  . Social Herbalist on phone: Not on file    Gets together: Not on file    Attends religious service: Not on file    Active member of club or organization: Not on file    Attends meetings of clubs or organizations: Not on file    Relationship status: Not on file  . Intimate partner violence    Fear of current or ex partner: Not on file    Emotionally abused: Not on file    Physically abused: Not on file    Forced sexual activity: Not on file  Other Topics Concern  . Not on file  Social History Narrative  . Not on file    FAMILY HISTORY: Family History  Problem Relation Age of Onset  . Colon cancer Mother 18  . Colon cancer Maternal Grandmother   . Stroke Maternal Grandmother   . Breast cancer Maternal Aunt 47  . Cancer Maternal Aunt        ? ovarian  . Breast cancer Other 60       breast cancer, ovarian cancer  . Breast cancer Daughter 66  . Stroke Paternal Grandmother   . Prostate cancer Paternal Grandfather   . Heart attack Neg Hx   . Hypertension Neg Hx     ALLERGIES:  is allergic to heparin and morphine and related.  MEDICATIONS:  Current Outpatient Medications  Medication Sig Dispense Refill  . arformoterol (BROVANA) 15 MCG/2ML NEBU USE 1 VIAL  IN  NEBULIZER TWICE   DAILY - (Morning and Evening) 60 mL 11  . atorvastatin (LIPITOR) 10 MG tablet TAKE ONE TABLET DAILY AT 6PM 90 tablet 1  . budesonide (PULMICORT) 0.5 MG/2ML nebulizer solution 2ML BY NEBULIZER TWICE DAILY 120 mL 5  . digoxin (LANOXIN) 0.125 MG tablet TAKE ONE TABLET DAILY 90 tablet 0  . diltiazem (CARDIZEM CD) 240 MG 24 hr capsule TAKE ONE CAPSULE EACH DAY 90 capsule 3  . fluorometholone (FML) 0.1 % ophthalmic suspension     . ipratropium (ATROVENT) 0.02 % nebulizer solution USE  2.76m BY NEBULIZER TWICE DAILY 150 mL 2  . latanoprost (XALATAN) 0.005 % ophthalmic solution Place 1 drop into both eyes at bedtime. Reported on 11/09/2015    . OXYGEN Inhale into the lungs.    . pantoprazole (PROTONIX) 40 MG tablet TAKE ONE TABLET DAILY 90 tablet 1  . SIMBRINZA 1-0.2 % SUSP Place 1 drop into both eyes 2 (two) times daily.   99  . valACYclovir (VALTREX) 1000 MG tablet Take 1 tablet by mouth 3 (three) times daily as needed.    .Marland Kitchenalbuterol (PROVENTIL HFA;VENTOLIN HFA) 108 (90 Base) MCG/ACT inhaler Inhale 2 puffs into the lungs every 6 (six) hours as needed for wheezing or shortness of breath. 1 Inhaler 2   No current facility-administered medications for this visit.     PHYSICAL EXAMINATION: ECOG PERFORMANCE STATUS: 2   Vitals:   04/08/19 1311  BP: 106/70  Pulse: 63  Resp: 18  Temp: 98.2 F (36.8 C)  SpO2: 97%   Filed Weights   04/08/19 1311  Weight: 149 lb 9.6 oz (67.9 kg)   GENERAL:alert, no distress and comfortable SKIN: skin color, texture, turgor are normal, no rashes or significant lesions EYES: normal, Conjunctiva are pink and non-injected, sclera clear  NECK: supple, thyroid normal size, non-tender, without nodularity LYMPH:  no palpable lymphadenopathy in the cervical, axillary  LUNGS: clear to auscultation and percussion with normal breathing effort (+) On O2 2L canula  HEART: regular rate & rhythm and no murmurs and no lower extremity edema ABDOMEN:abdomen soft, non-tender and  normal bowel sounds Musculoskeletal:no cyanosis of digits and no clubbing  NEURO: alert & oriented x 3 with fluent speech, no focal motor/sensory deficits BREAST: S/p right lumpectomy: Surgical incision healed well (+) palpable 4x3cm of UOQ of left breast, mildly tender at biopsy site. No right breast palpable mass, nodules or adenopathy bilaterally.   LABORATORY DATA:  I have reviewed the data as listed CBC Latest Ref Rng & Units 04/08/2019 08/22/2018 12/27/2017  WBC 4.0 - 10.5 K/uL 8.6 11.7(H) 9.0  Hemoglobin 12.0 - 15.0 g/dL 13.9 15.4(H) 13.4  Hematocrit 36.0 - 46.0 % 43.6 46.9(H) 42.5  Platelets 150 - 400 K/uL 223 226.0 219    CMP Latest Ref Rng & Units 04/08/2019 08/22/2018 12/28/2017  Glucose 70 - 99 mg/dL 85 93 96  BUN 8 - 23 mg/dL 18 21 22(H)  Creatinine 0.44 - 1.00 mg/dL 0.83 0.84 0.91  Sodium 135 - 145 mmol/L 143 139 141  Potassium 3.5 - 5.1 mmol/L 3.9 3.8 4.5  Chloride 98 - 111 mmol/L 106 99 101  CO2 22 - 32 mmol/L _0 Calcium 8.9 - 10.3 mg/dL 8.9 8.9 9.0  Total Protein 6.5 - 8.1 g/dL 6.7 - -  Total Bilirubin 0.3 - 1.2 mg/dL 0.6 - -  Alkaline Phos 38 - 126 U/L 132(H) - -  AST 15 - 41 U/L 26 - -  ALT 0 - 44 U/L 31 - -   H/o of right breast DCIS   ---MICROSCOPIC EXAMINATION AND DIAGNOSIS 01/14/03 LUMPECTOMY, RIGHT BREAST:  - EXTENSIVE COMPLEX DUCTAL CARCINOMA IN SITU,  INTERMEDIATE GRADE, APPROXIMATELY  8 CM.  - DUCTAL CARCINOMA IN SITU IS TRANSECTED AT THE MEDIAL,  SUPERIOR AND INFERIOR MARGINS.  - ATYPICAL LOBULAR HYPERPLASIA AND DUCTAL INVOLVEMENT BY  ATYPICAL LOBULAR  HYPERPLASIA.  - MULTIPLE INTRADUCTAL PAPILLOMAS PARTIALLY INVOLVED BY  DUCTAL CARCINOMA IN SITU.  - RADIAL SCAR WITH FLORID EPITHELIAL HYPERPLASIA WITHOUT  ATYPIA.  - SEE COMMENT.    ---  MICROSCOPIC EXAMINATION AND DIAGNOSIS 02/03/03 1. SENTINEL LYMPH NODE: NO METASTATIC CARCINOMA IDENTIFIED WITH  H  2. SENTINEL LYMPH NODE: NO METASTATIC CARCINOMA IDENTIFIED WITH  H   CYTOKERATIN IMMUNOPEROXIDASE SHOWS ISOLATED TUMORS CELLS ALL LESS  THAN 0.2 MM.  3. RIGHT BREAST, MASTECTOMY: FOCAL RESIDUAL DUCTAL CARCINOMA IN  SITU, INTERMEDIATE GRADE, 2 MM (0.2 CM IN GREATEST DIMENSION).  MARGINS NOT INVOLVED.    RADIOGRAPHIC STUDIES: I have personally reviewed the radiological images as listed and agreed with the findings in the report. No results found.  ASSESSMENT & PLAN:  [name] is a 84 y.o. Caucasian female with a history of COPD, s/p craniotomy of cerebellum from [place], GERD, Arthritis, Recurrent bladder papillary carcinoma, H/o right breast cancer, HTN, Afib, h/o shingles.   1 Malignant neoplasm of upper-outer quadrant of left breast, lobular carcinoma, Stage IA c(T1cN0M0), ER/PR+, HER2-, Grade II -We discussed her image findings and the biopsy results in great details. -She likely need a mastectomy. She is agreeable with that. She was seen by Dr. Dalbert Batman today and likely will proceed with surgery soon. However she will have to be cleared by her pulmonologist and cardiologist for anesthesia and surgery due to her COPD and CHF.  If she is at high risk for general anesthesia, Dr. Dalbert Batman may consider nerve block and local anesthesia for resection. -The risk of recurrence depends on the stage and biology of the tumor. She is early stage, with ER/PR positive and HER2 negative markers. I discussed this is the more common type of slow growing tumor.  -Given her age and comorbidities, she is not a candidate for chemotherapy. Will not do Oncotype test on surgical sample.  -She will meet with Dr. Lisbeth Renshaw to discuss her eligibility for radiation.  If she has mastectomy, she will likely does not need postmastectomy radiation. -Given the strong ER and PR expression in her postmenopausal status, I recommend adjuvant endocrine therapy with aromatase inhibitor Anastrozole for a total of 5 years to reduce the risk of cancer recurrence. Potential benefits and side effects  were discussed with patient and she is interested. I have low threshold for stopping if not tolerable. May start sooner if surgery is postponed.  We also discussed using aromatase inhibitor to control her disease if she is not a candidate for surgery. -We also discussed the breast cancer surveillance after her surgery. She will continue annual screening mammogram, self exam, and a routine office visit with lab and exam with [place]. -I encouraged her to have healthy diet and exercise regularly -Physical exam benign except 4x3cm mass of left breast.  -Labs reviewed, CBC and CMP WNL except MCV 101.9, Alk Phos 132.  -F/u after surgery    2. H/o of right breast DCIS  -diagnosed in 2004  -s/p right lumpectomy by Dr. Margot Chimes on 01/14/2003 then right mastectomy on 02/03/2003, no chemo or RT  3. H/o of Recurrent bladder papillary carcinoma -Initially diagnosed on 03/26/13 with high grade papillary urothelial carcinoma -Diagnosed recurrent low grade papillary urothelial carcinoma on 08/11/14 -underwent transurethral resection of bladder with Gyrus and stent placement.  -She continues to be followed by her Urologist Dr. Tresa Moore   4. Genetics  -I discussed her extensive family history of cancer. She is eligible for genetic testing. She declined given her daughter had breast cancer and already did her own genetic testing which was negative.   5. H/o of ICH and stroke, S/p craniotomy in 02/2015  6. HTN, Afib -Managed by cardiologist Dr. Stanford Breed -on Lipitor, Cardizem,  Protonix   7. Shingles and GERD and arthritis   -On Valtrex once daily and Protonix  -She stays up to date with shingles  -Managed by her PCP   8. COPD -On 2L O2 canula for less than a year, on it constantly -On albuterol and inhalers  -Overall stable  -Managed by Dr. Elsworth Soho    PLAN:  -She will see cardiology and pulmonary for preop clearance, if the risk is acceptable, she will proceed with mastectomy with Dr. Dalbert Batman -I will see her back  after surgery, to start her on AI    No orders of the defined types were placed in this encounter.   All questions were answered. The patient knows to call the clinic with any problems, questions or concerns. I spent 30 minutes counseling the patient face to face. The total time spent in the appointment was 45 minutes and more than 50% was on counseling.     Truitt Merle, MD 04/08/2019 4:08 PM  I, Joslyn Devon, am acting as scribe for Truitt Merle, MD.   I have reviewed the above documentation for accuracy and completeness, and I agree with the above.

## 2019-04-08 NOTE — Progress Notes (Signed)
REFERRING PROVIDER: Janith Lima, MD [address] [place],  [place] [address]  PRIMARY PROVIDER:  Janith Lima, MD  PRIMARY REASON FOR VISIT:  1. Malignant neoplasm of lateral wall of urinary bladder (HCC)   2. Family history of breast cancer   3. Family history of ovarian cancer   4. Family history of colon cancer   5. Family history of prostate cancer   6. Ductal carcinoma in situ (DCIS) of breast, unspecified laterality   7. Malignant neoplasm of upper-outer quadrant of left breast in female, estrogen receptor positive ([place])    I connected with [name] on 04/08/2019 at 2:30 pm EDT by Webex video conference and verified that I am speaking with the correct person using two identifiers.    Patient location: [facility] Provider location: clinic   HISTORY OF PRESENT ILLNESS:   [name], a 84 y.o. female, was seen for a [place] cancer genetics consultation due to a personal and family history of cancer.  [name] presents to clinic today to discuss the possibility of a hereditary predisposition to cancer, genetic testing, and to further clarify her future cancer risks, as well as potential cancer risks for family members.   In 2004, at the age of 54, [name] was diagnosed with right breast  DCIS, treated with mastectomy.   In 2014, at the age of 77, [name] was diagnosed with papillary urothelial carcinoma, and then diagnosed with recurrence of this in 2015. She underwent a resection of her bladder and is followed by urology.   In 2020, at the age of 56, [name] was diagnosed with  Invasive mammary carcinoma of the left breast, ER/PR+, Her2-. The treatment plan includes likely mastectomy and antiestrogen therapy.    CANCER HISTORY:  Oncology History  Malignant neoplasm of upper-outer quadrant of left breast in female, estrogen receptor positive ([place])  03/30/2019 Mammogram   Left diagnostic mammogram and [place] 03/30/19  IMPRESSION The 1.6cm  heterogenous focal area in the left breast at 1:00-2:00 4-6cm form the nipple, corresponding to the hard palpable area in the left breast is suspicious of malignancy, particularly lobular carcinoma.     04/01/2019 Cancer Staging   Staging form: Breast, AJCC 8th Edition - Clinical stage from 04/01/2019: Stage IA (cT1c, cN0, cM0, G2, ER+, PR+, HER2-) - Signed by Truitt Merle, MD on 04/07/2019   04/01/2019 Initial Biopsy   Diagnosis 04/01/19 Breast, left, needle core biopsy, 2 o'clock, 5cmfn - INVASIVE MAMMARY CARCINOMA, SEE COMMENT. - MAMMARY CARCINOMA IN SITU.   04/01/2019 Receptors her2   The tumor cells are NEGATIVE for Her2 (1+). Estrogen Receptor: 100%, POSITIVE, STRONG STAINING INTENSITY Progesterone Receptor: 30%, POSITIVE, STRONG STAINING INTENSITY Proliferation Marker Ki67: 20%   04/03/2019 Initial Diagnosis   Malignant neoplasm of upper-outer quadrant of left breast in female, estrogen receptor positive ([place])      RISK FACTORS:  Menarche was at age 55.  First live birth at age 89.  OCP use for approximately 0 years.  Menopausal status: postmenopausal.    Past Medical History:  Diagnosis Date  . Arthritis   . COPD (chronic obstructive pulmonary disease) ([place])   . Dyspnea on exertion   . Family history of breast cancer   . Family history of colon cancer   . Family history of ovarian cancer   . Family history of prostate cancer   . GERD (gastroesophageal reflux disease)   . Glaucoma of both eyes   . History of atrial fibrillation  without current medication 2010   POST SURG 2010--  PER PT NO ISSUES SINCE  . History of breast cancer    DX DCIS IN 2004--  S/P RIGHT MASTECTOMY , NO CHEMORADIATION---  NO RECURRENCE  . History of CHF (congestive heart failure)    POST SURG 2010  . History of hypertension   . History of shingles    02/ 2015--  back of neck and left flank--  no residual pain  . ICH (intracerebral hemorrhage) ([place]) 02/05/2015  . Nephrolithiasis   . Recurrent  bladder papillary carcinoma ([place]) first dx 06/ 2014   s/p  turbt's  and instillation mitomycin c (chemo)//dx again in 2015-different type  . Stroke [facility])     Past Surgical History:  Procedure Laterality Date  . CATARACT EXTRACTION W/ INTRAOCULAR LENS  IMPLANT, BILATERAL    . CRANIECTOMY N/A 02/05/2015   Procedure: SUBOCCIPITAL CRANIECTOMY EVACUATION OF HEMATOMA;  Surgeon: Consuella Lose, MD;  Location: [place] [facility];  Service: Neurosurgery;  Laterality: N/A;  . CYSTOSCOPY W/ RETROGRADES Bilateral 08/11/2014   Procedure: CYSTOSCOPY WITH BILATERAL RETROGRADE PYELOGRAM AND MITOMYCIN INSTILLATION;  Surgeon: Alexis Frock, MD;  Location: [facility];  Service: Urology;  Laterality: Bilateral;  . CYSTOSCOPY W/ URETERAL STENT PLACEMENT Bilateral 03/26/2013   Procedure: CYSTOSCOPY WITH BILATERAL RETROGRADE PYELOGRAM/URETERAL STENT PLACEMENT;  Surgeon: Alexis Frock, MD;  Location: [facility];  Service: Urology;  Laterality: Bilateral;  . CYSTOSCOPY W/ URETERAL STENT PLACEMENT Left 05/13/2013   Procedure: CYSTOSCOPY WITH RETROGRADE PYELOGRAM/URETERAL STENT PLACEMENT STENT EXCHANGE;  Surgeon: Alexis Frock, MD;  Location: [facility];  Service: Urology;  Laterality: Left;  . PARTIAL MASTECTOMY WITH NEEDLE LOCALIZATION Right 01-14-2003   DCIS  . REMOVAL CYST LEFT HAND  2013  . TOTAL KNEE ARTHROPLASTY Right 03-22-2009  . TOTAL MASTECTOMY Right 02-03-2003   W/ SLN BX  AND  POST 03-01-2003 EVACUATION HEMATOMA  . TRANSTHORACIC ECHOCARDIOGRAM  03-25-2009   MILD LVF/ EF 70-80%/ MILD INCREASE SYSTOLIC PULMONARY  PRESSURE  . TRANSURETHRAL RESECTION OF BLADDER TUMOR WITH GYRUS (TURBT-GYRUS) N/A 03/26/2013   Procedure: TRANSURETHRAL RESECTION OF BLADDER TUMOR WITH GYRUS (TURBT-GYRUS);  Surgeon: Alexis Frock, MD;  Location: [facility];  Service: Urology;  Laterality: N/A;  . TRANSURETHRAL RESECTION OF BLADDER TUMOR WITH GYRUS (TURBT-GYRUS) N/A  05/13/2013   Procedure: TRANSURETHRAL RESECTION OF BLADDER TUMOR WITH GYRUS (TURBT-GYRUS)  RE-STAGING TRANSURETHRAL RESECTION OF BLADDER TUMOR, LEFT RETROGRADE PYELOGRAM AND STENT EXCHANGE;  Surgeon: Alexis Frock, MD;  Location[facility];  Service: Urology;  Laterality: N/A;  . TRANSURETHRAL RESECTION OF BLADDER TUMOR WITH GYRUS (TURBT-GYRUS) N/A 08/11/2014   Procedure: TRANSURETHRAL RESECTION OF BLADDER TUMOR WITH GYRUS (TURBT-GYRUS);  Surgeon: Alexis Frock, MD;  Location: [facility];  Service: Urology;  Laterality: N/A;  . TUBAL LIGATION      Social History   Socioeconomic History  . Marital status: Married    Spouse name: Not on file  . Number of children: 4  . Years of education: Not on file  . Highest education level: Not on file  Occupational History  . Not on file  Social Needs  . Financial resource strain: Not on file  . Food insecurity    Worry: Not on file    Inability: Not on file  . Transportation needs    Medical: Not on file    Non-medical: Not on file  Tobacco Use  . Smoking status: Former Smoker    Packs/day: 1.00  Years: 40.00    Pack years: 40.00    Types: Cigarettes    Quit date: 03/20/2003    Years since quitting: 16.0  . Smokeless tobacco: Never Used  Substance and Sexual Activity  . Alcohol use: No    Alcohol/week: 0.0 standard drinks  . Drug use: No  . Sexual activity: Not Currently    Partners: Male    Birth control/protection: Surgical    Comment: BTL  Lifestyle  . Physical activity    Days per week: Not on file    Minutes per session: Not on file  . Stress: Not on file  Relationships  . Social Herbalist on phone: Not on file    Gets together: Not on file    Attends religious service: Not on file    Active member of club or organization: Not on file    Attends meetings of clubs or organizations: Not on file    Relationship status: Not on file  Other Topics Concern  . Not on file  Social  History Narrative  . Not on file     FAMILY HISTORY:  We obtained a detailed, 4-generation family history.  Significant diagnoses are listed below: Family History  Problem Relation Age of Onset  . Colon cancer Mother 67  . Colon cancer Maternal Grandmother   . Stroke Maternal Grandmother   . Breast cancer Maternal Aunt 45  . Cancer Maternal Aunt        ? ovarian  . Breast cancer Other 60       breast cancer, ovarian cancer  . Breast cancer Daughter 70  . Stroke Paternal Grandmother   . Prostate cancer Paternal Grandfather   . Heart attack Neg Hx   . Hypertension Neg Hx    [name] has 3 daughters and 1 son. Her daughter, [name], had breast cancer at 22 and reportedly negative genetic testing. Patient does not have siblings.  [name] mother had colon cancer in her 85's and died at 41. Patient had 2 maternal uncles, 2 maternal aunts. One of her aunts had breast cancer at 50. This aunt had a daughter who had breast and ovarian cancer and died at 52. [name] other maternal aunt also had cancer, she believes ovarian. No other known cancers in maternal cousins. Her maternal grandmother had colon cancer and died in her 73s. Maternal grandfather died in his 27s.   [name] father died at 33. Patient had 4 paternal uncles, 1 paternal aunt. No known cancers in paternal cousins. Paternal grandfather had prostate cancer and died at 95. Paternal grandmother died at 26.   [name] is aware of previous family history of genetic testing for hereditary cancer risks.  There is no reported Ashkenazi Jewish ancestry. There is no known consanguinity.  GENETIC COUNSELING ASSESSMENT: [name] is a 84 y.o. female with a personal and family history which is somewhat suggestive of a hereditary cancer syndrome and predisposition to cancer. We, therefore, discussed and recommended the following at today's visit.   DISCUSSION: We discussed that 5 - 10% of breast cancer is hereditary, with  most cases associated with BRCA1/BRCA2 mutations.  There are other genes that can be associated with hereditary breast cancer syndromes.  These include PALB2, ATM, CHEK2.  We discussed that testing is beneficial for several reasons including surgical decision-making for breast cancer, knowing how to follow individuals after completing their treatment, and understand if other family members could be at risk for cancer and  allow them to undergo genetic testing.   We reviewed the characteristics, features and inheritance patterns of hereditary cancer syndromes. We also discussed genetic testing, including the appropriate family members to test, the process of testing, insurance coverage and turn-around-time for results. We discussed the implications of a negative, positive and/or variant of uncertain significant result. In order to get genetic test results in a timely manner so that [name] can use these genetic test results for surgical decisions, we recommended [name] pursue genetic testing for the [facility] Breast Cancer STAT Panel. Once complete, we recommend [name] pursue reflex genetic testing to the Common Hereditary Cancers gene panel.   The STAT Breast cancer panel offered by [facility] includes sequencing and rearrangement analysis for the following 9 genes:  ATM, BRCA1, BRCA2, CDH1, CHEK2, PALB2, PTEN, STK11 and TP53.    The Common Hereditary Cancers Panel offered by [facility] includes sequencing and/or deletion duplication testing of the following 48 genes: APC, ATM, AXIN2, BARD1, BMPR1A, BRCA1, BRCA2, BRIP1, CDH1, CDKN2A (p14ARF), CDKN2A (p16INK4a), CKD4, CHEK2, CTNNA1, DICER1, EPCAM (Deletion/duplication testing only), GREM1 (promoter region deletion/duplication testing only), KIT, MEN1, MLH1, MSH2, MSH3, MSH6, MUTYH, NBN, NF1, NHTL1, PALB2, PDGFRA, PMS2, POLD1, POLE, PTEN, RAD50, RAD51C, RAD51D, RNF43, SDHB, SDHC, SDHD, SMAD4, SMARCA4. STK11, TP53, TSC1, TSC2, and VHL.  The following genes  were evaluated for sequence changes only: SDHA and HOXB13 c.251G>A variant only.  Based on Ms. Ciulla's personal and family history of cancer, she meets medical criteria for genetic testing. Despite that she meets criteria, she may still have an out of pocket cost.   PLAN: After considering the risks, benefits, and limitations, Ms. Rabadan provided informed consent to pursue genetic testing and the blood sample was sent to [facility] for analysis of the Breast Cancer STAT Panel + Common Hereditary Cancers Panel.. Initial results should be available within approximately 5-12 days' time, at which point they will be disclosed by telephone to [name], as will any additional recommendations warranted by these results. Ms. Rothlisberger will receive a summary of her genetic counseling visit and a copy of her results once available. This information will also be available in Epic.   Lastly, we encouraged Ms. Stucki to remain in contact with cancer genetics annually so that we can continuously update the family history and inform her of any changes in cancer genetics and testing that may be of benefit for this family.   Ms. Cressy questions were answered to her satisfaction today. Our contact information was provided should additional questions or concerns arise. Thank you for the referral and allowing [place] to share in the care of your patient.   Faith Rogue, MS, [facility] Genetic Counselor [email or web address] Phone: [phone]  The patient was seen for a total of 15 minutes in virtual genetic counseling.  Drs. Magrinat, Lindi Adie and/or Burr Medico were available for discussion regarding this case.   _______________________________________________________________________ For Office Staff:  Number of people involved in session: 1 Was an Intern/ student involved with case: no

## 2019-04-08 NOTE — Progress Notes (Signed)
Radiation Oncology         [phone] ________________________________  Name: [name]        MRN: [id number]  Date of Service: 04/08/2019 DOB: [birth year]  ER:[id number], Arvid Right, MD  Fanny Skates, MD     REFERRING PHYSICIAN: Fanny Skates, MD   DIAGNOSIS: The encounter diagnosis was Malignant neoplasm of upper-outer quadrant of left breast in female, estrogen receptor positive ([place]).   HISTORY OF PRESENT ILLNESS: [name] is a 84 y.o. female seen in the multidisciplinary breast clinic for a new diagnosis of left breast cancer. The patient was noted to have a prior cancer in the right breast in 2004 treated with mastectomy.  She reports that she palpated an abnormal area in the left upper breast.  Diagnostic imaging revealed a 1.6 cm mass from 1-2 o'clock in the left breast that was indistinct, and her axilla was negative for any adenopathy, interestingly on examination this was felt to be more along the lines of 3 to 4 cm in the upper outer quadrant.  She did undergo ultrasound-guided biopsy on 04/01/2019 which revealed a grade 2 invasive lobular carcinoma with an LCIS.  Her tumor was ER/PR positive, and HER-2/neu negative.  Her Ki-67 was 20%.  She comes today to discuss treatment recommendations for her cancer.   PREVIOUS RADIATION THERAPY: No   PAST MEDICAL HISTORY:  Past Medical History:  Diagnosis Date  . Arthritis   . COPD (chronic obstructive pulmonary disease) ([place])   . Dyspnea on exertion   . Family history of breast cancer   . Family history of colon cancer   . Family history of ovarian cancer   . Family history of prostate cancer   . GERD (gastroesophageal reflux disease)   . Glaucoma of both eyes   . History of atrial fibrillation without current medication 2010   POST SURG 2010--  PER PT NO ISSUES SINCE  . History of breast cancer    DX DCIS IN 2004--  S/P RIGHT MASTECTOMY , NO CHEMORADIATION---  NO RECURRENCE  . History of CHF (congestive heart  failure)    POST SURG 2010  . History of hypertension   . History of shingles    02/ 2015--  back of neck and left flank--  no residual pain  . ICH (intracerebral hemorrhage) ([place]) 02/05/2015  . Nephrolithiasis   . Recurrent bladder papillary carcinoma ([place]) first dx 06/ 2014   s/p  turbt's  and instillation mitomycin c (chemo)//dx again in 2015-different type  . Stroke [facility])        PAST SURGICAL HISTORY: Past Surgical History:  Procedure Laterality Date  . CATARACT EXTRACTION W/ INTRAOCULAR LENS  IMPLANT, BILATERAL    . CRANIECTOMY N/A 02/05/2015   Procedure: SUBOCCIPITAL CRANIECTOMY EVACUATION OF HEMATOMA;  Surgeon: Consuella Lose, MD;  Location: [place] [facility];  Service: Neurosurgery;  Laterality: N/A;  . CYSTOSCOPY W/ RETROGRADES Bilateral 08/11/2014   Procedure: CYSTOSCOPY WITH BILATERAL RETROGRADE PYELOGRAM AND MITOMYCIN INSTILLATION;  Surgeon: Alexis Frock, MD;  Location: [facility];  Service: Urology;  Laterality: Bilateral;  . CYSTOSCOPY W/ URETERAL STENT PLACEMENT Bilateral 03/26/2013   Procedure: CYSTOSCOPY WITH BILATERAL RETROGRADE PYELOGRAM/URETERAL STENT PLACEMENT;  Surgeon: Alexis Frock, MD;  Location: [facility];  Service: Urology;  Laterality: Bilateral;  . CYSTOSCOPY W/ URETERAL STENT PLACEMENT Left 05/13/2013   Procedure: CYSTOSCOPY WITH RETROGRADE PYELOGRAM/URETERAL STENT PLACEMENT STENT EXCHANGE;  Surgeon: Alexis Frock, MD;  Location: [facility];  Service: Urology;  Laterality:  Left;  . PARTIAL MASTECTOMY WITH NEEDLE LOCALIZATION Right 01-14-2003   DCIS  . REMOVAL CYST LEFT HAND  2013  . TOTAL KNEE ARTHROPLASTY Right 03-22-2009  . TOTAL MASTECTOMY Right 02-03-2003   W/ SLN BX  AND  POST 03-01-2003 EVACUATION HEMATOMA  . TRANSTHORACIC ECHOCARDIOGRAM  03-25-2009   MILD LVF/ EF 70-80%/ MILD INCREASE SYSTOLIC PULMONARY  PRESSURE  . TRANSURETHRAL RESECTION OF BLADDER TUMOR WITH GYRUS (TURBT-GYRUS) N/A 03/26/2013    Procedure: TRANSURETHRAL RESECTION OF BLADDER TUMOR WITH GYRUS (TURBT-GYRUS);  Surgeon: Alexis Frock, MD;  Location: [facility];  Service: Urology;  Laterality: N/A;  . TRANSURETHRAL RESECTION OF BLADDER TUMOR WITH GYRUS (TURBT-GYRUS) N/A 05/13/2013   Procedure: TRANSURETHRAL RESECTION OF BLADDER TUMOR WITH GYRUS (TURBT-GYRUS)  RE-STAGING TRANSURETHRAL RESECTION OF BLADDER TUMOR, LEFT RETROGRADE PYELOGRAM AND STENT EXCHANGE;  Surgeon: Alexis Frock, MD;  Location: [facility];  Service: Urology;  Laterality: N/A;  . TRANSURETHRAL RESECTION OF BLADDER TUMOR WITH GYRUS (TURBT-GYRUS) N/A 08/11/2014   Procedure: TRANSURETHRAL RESECTION OF BLADDER TUMOR WITH GYRUS (TURBT-GYRUS);  Surgeon: Alexis Frock, MD;  Location: [facility];  Service: Urology;  Laterality: N/A;  . TUBAL LIGATION       FAMILY HISTORY:  Family History  Problem Relation Age of Onset  . Colon cancer Mother 27  . Colon cancer Maternal Grandmother   . Stroke Maternal Grandmother   . Breast cancer Maternal Aunt 42  . Cancer Maternal Aunt        ? ovarian  . Breast cancer Other 60       breast cancer, ovarian cancer  . Breast cancer Daughter 66  . Stroke Paternal Grandmother   . Prostate cancer Paternal Grandfather   . Heart attack Neg Hx   . Hypertension Neg Hx      SOCIAL HISTORY:  reports that she quit smoking about 16 years ago. Her smoking use included cigarettes. She has a 40.00 pack-year smoking history. She has never used smokeless tobacco. She reports that she does not drink alcohol or use drugs.   ALLERGIES: Heparin and Morphine and related   MEDICATIONS:  Current Outpatient Medications  Medication Sig Dispense Refill  . albuterol (PROVENTIL HFA;VENTOLIN HFA) 108 (90 Base) MCG/ACT inhaler Inhale 2 puffs into the lungs every 6 (six) hours as needed for wheezing or shortness of breath. 1 Inhaler 2  . arformoterol (BROVANA) 15 MCG/2ML NEBU USE 1 VIAL  IN  NEBULIZER  TWICE  DAILY - (Morning and Evening) 60 mL 11  . atorvastatin (LIPITOR) 10 MG tablet TAKE ONE TABLET DAILY AT 6PM 90 tablet 1  . budesonide (PULMICORT) 0.5 MG/2ML nebulizer solution 2ML BY NEBULIZER TWICE DAILY 120 mL 5  . digoxin (LANOXIN) 0.125 MG tablet TAKE ONE TABLET DAILY 90 tablet 0  . diltiazem (CARDIZEM CD) 240 MG 24 hr capsule TAKE ONE CAPSULE EACH DAY 90 capsule 3  . fluorometholone (FML) 0.1 % ophthalmic suspension     . ipratropium (ATROVENT) 0.02 % nebulizer solution USE 2.36m BY NEBULIZER TWICE DAILY 150 mL 2  . latanoprost (XALATAN) 0.005 % ophthalmic solution Place 1 drop into both eyes at bedtime. Reported on 11/09/2015    . OXYGEN Inhale into the lungs.    . pantoprazole (PROTONIX) 40 MG tablet TAKE ONE TABLET DAILY 90 tablet 1  . SIMBRINZA 1-0.2 % SUSP Place 1 drop into both eyes 2 (two) times daily.   99  . valACYclovir (VALTREX) 1000 MG tablet Take 1 tablet by mouth 3 (three) times daily  as needed.     No current facility-administered medications for this encounter.      REVIEW OF SYSTEMS: On review of systems, the patient reports that she is doing well overall. She denies any chest pain, shortness of breath, cough, fevers, chills, night sweats, unintended weight changes. She denies any bowel or bladder disturbances, and denies abdominal pain, nausea or vomiting. She denies any new musculoskeletal or joint aches or pains. A complete review of systems is obtained and is otherwise negative.     PHYSICAL EXAM:  Wt Readings from Last 3 Encounters:  04/08/19 149 lb 9.6 oz (67.9 kg)  03/24/19 149 lb (67.6 kg)  01/02/19 152 lb 12.8 oz (69.3 kg)   Temp Readings from Last 3 Encounters:  04/08/19 98.2 F (36.8 C) (Oral)  03/24/19 (!) 97.1 F (36.2 C) (Temporal)  09/01/18 97.6 F (36.4 C)   BP Readings from Last 3 Encounters:  04/08/19 106/70  03/24/19 (!) 92/52  01/02/19 112/62   Pulse Readings from Last 3 Encounters:  04/08/19 63  03/24/19 80  01/02/19 71   In  general this is a well appearing Caucasian female in no acute distress.  She's alert and oriented x4 and appropriate throughout the examination. Cardiopulmonary assessment is negative for acute distress and she exhibits normal effort.  Breast exam is deferred.    ECOG = 1  0 - Asymptomatic (Fully active, able to carry on all predisease activities without restriction)  1 - Symptomatic but completely ambulatory (Restricted in physically strenuous activity but ambulatory and able to carry out work of a light or sedentary nature. For example, light housework, office work)  2 - Symptomatic, <50% in bed during the day (Ambulatory and capable of all self care but unable to carry out any work activities. Up and about more than 50% of waking hours)  3 - Symptomatic, >50% in bed, but not bedbound (Capable of only limited self-care, confined to bed or chair 50% or more of waking hours)  4 - Bedbound (Completely disabled. Cannot carry on any self-care. Totally confined to bed or chair)  5 - Death   Eustace Pen MM, Creech RH, Tormey DC, et al. [phone]). "Toxicity and response criteria of the [facility]". [place] Oncol. 5 (6): 649-55    LABORATORY DATA:  Lab Results  Component Value Date   WBC 8.6 04/08/2019   HGB 13.9 04/08/2019   HCT 43.6 04/08/2019   MCV 101.9 (H) 04/08/2019   PLT 223 04/08/2019   Lab Results  Component Value Date   NA 143 04/08/2019   K 3.9 04/08/2019   CL 106 04/08/2019   CO2 27 04/08/2019   Lab Results  Component Value Date   ALT 31 04/08/2019   AST 26 04/08/2019   ALKPHOS 132 (H) 04/08/2019   BILITOT 0.6 04/08/2019      RADIOGRAPHY: No results found.     IMPRESSION/PLAN: 1. Stage IA, cT1cN0M0 grade 2 ER/PR positive invasive lobular carcinoma of the left breast.  I met with the patient today to reiterate the recommendations discussed in conference.  She is currently planning to proceed with mastectomy with Dr. Dalbert Batman.  We reviewed the  limitations of lobular carcinoma as far as extent of disease represented on mammography and ultrasound, and discussed that we would follow-up with the results of her final pathology.  This would help guide whether she would benefit from any adjuvant treatment with radiation.  She would benefit from antiestrogen therapy and that has been reviewed  with her.  We discussed the scenarios for which radiotherapy would be used in the postmastectomy setting, and discussed the risks, benefits, short and long-term effects of radiotherapy as well as delivery and logistics of treatment.     Carola Rhine, PAC Seen on behalf of Jodelle Gross, PhD, MD

## 2019-04-09 ENCOUNTER — Telehealth: Payer: Self-pay | Admitting: Pulmonary Disease

## 2019-04-09 ENCOUNTER — Encounter: Payer: Self-pay | Admitting: Hematology

## 2019-04-09 ENCOUNTER — Telehealth: Payer: Self-pay | Admitting: Hematology

## 2019-04-09 NOTE — Telephone Encounter (Signed)
No los per 8/5.

## 2019-04-09 NOTE — Telephone Encounter (Signed)
Received a fax from [facility]. Patient will be having a left total mastectomy in the near future under general anesthesia. Surgery has not been scheduled yet due to them waiting on a surgical clearance from [place].   Patient last had a OV on 01/14/19 with Beth which was a televisit. OV notes are below:    History of Present Illness: 84 year old female, former smoker quit in 2004 (40 pack year hx). PMH significant for COPD, chronic respiratory failure with hypoxia (2L oxygen), HTN, afib (not anticoagulated), chronic combined systolic and diastolic congestive heart failure, breast cancer s/p resection, recurrent bladder cancer s/p TURP/chemo. Patient of Dr. Elsworth Soho, seen for initial consult on 02/18/18. Admitted in April 2019 for COPD exacerbation. Maintained on Pulmicort and Brovana nebulizer twice daily. Up today with Pneumovax and Prevnar.   01/14/2019 Contacted today by phone call for 3 month follow-up visit, she was not able to speak for long d/t currently moving. She is doing well, breathing is baseline. No recent exacerbations of her COPD. Only using Brovana/Pulmicort once a day because she has been so busy. She does feel her breathing is better when she uses her nebulizer's twice a day. Reports left knee swelling, ultrasound negative for DVT. She plans to follow up with her PCP regarding this and a rash on her ankles. Denies fever, cough or wheeze.   Observations/Objective: - No significant shortness of breath, wheezing or cough observed during phone conversation  Assessment and Plan:  COPD: - Stable interval; no recent exacerbations of her symptoms  - Continue Brovana twice daily - Continue Pulmicort twice daily  - Continue Atrovent twice daily  - PRN albuterol/proventil hfa q6 hours for sob/wheezing - Reinforced importance of taking nebulizer as prescribed  Follow Up Instructions:  - July/August with Dr. Elsworth Soho  I discussed the assessment and treatment plan with the  patient. The patient was provided an opportunity to ask questions and all were answered. The patient agreed with the plan and demonstrated an understanding of the instructions.  The patient was advised to call back or seek an in-person evaluation if the symptoms worsen or if the condition fails to improve as anticipated.  RA, are you ok with clearing her for surgery? Please advise. Thanks!

## 2019-04-09 NOTE — Telephone Encounter (Signed)
She does have chronic respiratory failure requiring oxygen and is at moderate risk for postop pulmonary complications We can clear her for surgery with this caveat

## 2019-04-09 NOTE — Telephone Encounter (Signed)
See send clearance letter to [facility].   Nothing further needed.

## 2019-04-09 NOTE — Telephone Encounter (Signed)
Agree. See Dr. Bari Mantis response

## 2019-04-10 ENCOUNTER — Telehealth: Payer: Self-pay

## 2019-04-10 NOTE — Telephone Encounter (Signed)
   [facility] Pre-operative Risk Assessment    Request for surgical clearance:  1. What type of surgery is being performed? LEFT TOTAL MASTECOMY   2. When is this surgery scheduled? TBD   3. What type of clearance is required (medical clearance vs. Pharmacy clearance to hold med vs. Both)? MEDICAL  4. Are there any medications that need to be held prior to surgery and how long? NONE   5. Practice name and name of physician performing surgery? [facility] Fanny Skates MD  ATTN:JACQUELINE   6. What is your office phone number  [phone]    7.   What is your office fax number  [phone]  8.   Anesthesia type (None, local, MAC, general) ? NOT LISTED   Waylan Rocher 04/10/2019, 7:47 AM  _________________________________________________________________   (provider comments below)

## 2019-04-13 NOTE — Telephone Encounter (Signed)
Left voicemail.

## 2019-04-13 NOTE — Telephone Encounter (Signed)
   Primary Cardiologist: Kirk Ruths, MD  Chart reviewed as part of pre-operative protocol coverage. Patient was contacted 04/13/2019 in reference to pre-operative risk assessment for pending surgery as outlined below.  [name] was last seen on 01/02/19 by Dr. Stanford Breed.  Since that day, [name] has done well. Her mobility is limited by her prior strokes and home oxygen needs. She can still complete nearly 4.0 METS without angina. No history of myocardial infarction. Her main surgical risks are exacerbation of her atrial fibrillation and complications stemming from her pulmonary status. She understands that she is at increased, but acceptable, risk for planned procedure. Per the patient, the surgeon will also reach out to her pulmonologist for clearance.    Therefore, based on ACC/AHA guidelines, the patient would be at acceptable risk for the planned procedure without further cardiovascular testing.   I will route this recommendation to the requesting party via Epic fax function and remove from pre-op pool.  Please call with questions.  Pink, PA 04/13/2019, 4:26 PM

## 2019-04-13 NOTE — Telephone Encounter (Signed)
  Patient is returning call

## 2019-04-14 ENCOUNTER — Encounter: Payer: Self-pay | Admitting: General Practice

## 2019-04-14 NOTE — Progress Notes (Signed)
[place] Psychosocial Distress Screening Spiritual Care  Met with Pamala Hurry by phone following Breast [facility] to introduce [place] team/resources, reviewing distress screen per protocol.  The patient scored a 3 on the Psychosocial Distress Thermometer which indicates mild distress. Also assessed for distress and other psychosocial needs.   ONCBCN DISTRESS SCREENING 04/14/2019  Screening Type Initial Screening  Distress experienced in past week (1-10) 3  Referral to support programs Yes   Ms Broers reports good support from her husband and three children. Although she feels some anxiety at the waiting (for clearance re surgery), per pt she is coping well overall because this is a familiar experience (mastectomy 2004).    Follow up needed: No. Per pt, no needs or questions at this time, but she knows to reach out to Patient and Family Support center whenever needed/desired re team/programming resources. Please also page if needs arise or circumstances change. Thank you.   [place], [place], [facility] Pager [phone] Voicemail [phone]

## 2019-04-15 ENCOUNTER — Telehealth: Payer: Self-pay

## 2019-04-15 NOTE — Telephone Encounter (Signed)
Nutrition Assessment  Reason for Assessment:  Pt attended Breast Clinic on 8/5 and received nutrition packet from nurse navigator  ASSESSMENT: 84 year old female with left breast cancer (stage 1a, lobular carcinoma).  Past medical history of COPD, HTN, afib, GERD, stroke, mastectomy on right in 2004.  Planning surgery if medically cleared and then adjuvant antiestrogens.   Called and left message for patient to return call.   Return number left on answering machine  Ellinore Merced B. Zenia Resides, [place], [place] Registered Dietitian [phone] (pager)

## 2019-04-16 ENCOUNTER — Telehealth: Payer: Self-pay | Admitting: *Deleted

## 2019-04-16 NOTE — Telephone Encounter (Signed)
Called and spoke with patient from [facility].  She states she was able to get cardiac clearance for surgery but still waiting on pulmonary clearance. Encouraged her to call with any needs or concerns.

## 2019-04-17 ENCOUNTER — Ambulatory Visit: Payer: Self-pay | Admitting: Licensed Clinical Social Worker

## 2019-04-17 ENCOUNTER — Telehealth: Payer: Self-pay | Admitting: Licensed Clinical Social Worker

## 2019-04-17 ENCOUNTER — Encounter: Payer: Self-pay | Admitting: Licensed Clinical Social Worker

## 2019-04-17 ENCOUNTER — Other Ambulatory Visit: Payer: Self-pay | Admitting: General Surgery

## 2019-04-17 DIAGNOSIS — Z8042 Family history of malignant neoplasm of prostate: Secondary | ICD-10-CM

## 2019-04-17 DIAGNOSIS — Z8041 Family history of malignant neoplasm of ovary: Secondary | ICD-10-CM

## 2019-04-17 DIAGNOSIS — Z170 Estrogen receptor positive status [ER+]: Secondary | ICD-10-CM

## 2019-04-17 DIAGNOSIS — Z803 Family history of malignant neoplasm of breast: Secondary | ICD-10-CM

## 2019-04-17 DIAGNOSIS — Z1379 Encounter for other screening for genetic and chromosomal anomalies: Secondary | ICD-10-CM

## 2019-04-17 DIAGNOSIS — Z800 Family history of malignant neoplasm of digestive organs: Secondary | ICD-10-CM

## 2019-04-17 DIAGNOSIS — C50412 Malignant neoplasm of upper-outer quadrant of left female breast: Secondary | ICD-10-CM

## 2019-04-17 NOTE — Telephone Encounter (Signed)
Revealed negative genetic testing.   We discussed that we do not know why she has breast cancer or why there is cancer in the family. It could be due to a different gene that we are not testing, or something our current technology cannot pick up.  It will be important for her to keep in contact with genetics to learn if additional testing may be needed in the future.

## 2019-04-17 NOTE — Progress Notes (Signed)
HPI:  [name] was previously seen in the  Cancer Genetics clinic due to a personal and family history of cancer and concerns regarding a hereditary predisposition to cancer. Please refer to our prior cancer genetics clinic note for more information regarding our discussion, assessment and recommendations, at the time. [name]'s recent genetic test results were disclosed to her, as were recommendations warranted by these results. These results and recommendations are discussed in more detail below.  CANCER HISTORY:  Oncology History  Malignant neoplasm of upper-outer quadrant of left breast in female, estrogen receptor positive (HCC)  03/30/2019 Mammogram   Left diagnostic mammogram and US 03/30/19  IMPRESSION The 1.6cm heterogenous focal area in the left breast at 1:00-2:00 4-6cm form the nipple, corresponding to the hard palpable area in the left breast is suspicious of malignancy, particularly lobular carcinoma.     04/01/2019 Cancer Staging   Staging form: Breast, AJCC 8th Edition - Clinical stage from 04/01/2019: Stage IA (cT1c, cN0, cM0, G2, ER+, PR+, HER2-) - Signed by Feng, Yan, MD on 04/07/2019   04/01/2019 Initial Biopsy   Diagnosis 04/01/19 Breast, left, needle core biopsy, 2 o'clock, 5cmfn - INVASIVE MAMMARY CARCINOMA, SEE COMMENT. - MAMMARY CARCINOMA IN SITU.   04/01/2019 Receptors her2   The tumor cells are NEGATIVE for Her2 (1+). Estrogen Receptor: 100%, POSITIVE, STRONG STAINING INTENSITY Progesterone Receptor: 30%, POSITIVE, STRONG STAINING INTENSITY Proliferation Marker Ki67: 20%   04/03/2019 Initial Diagnosis   Malignant neoplasm of upper-outer quadrant of left breast in female, estrogen receptor positive (HCC)    Genetic Testing   Negative genetic testing. No pathogenic variants identified on the [facility] Breast Cancer STAT Panel + Common Hereditary Cancers Panel. The STAT Breast cancer panel offered by [facility] includes sequencing and rearrangement analysis  for the following 9 genes:  ATM, BRCA1, BRCA2, CDH1, CHEK2, PALB2, PTEN, STK11 and TP53.  The Common Hereditary Cancers Panel offered by [facility] includes sequencing and/or deletion duplication testing of the following 47 genes: APC, ATM, AXIN2, BARD1, BMPR1A, BRCA1, BRCA2, BRIP1, CDH1, CDKN2A (p14ARF), CDKN2A (p16INK4a), CKD4, CHEK2, CTNNA1, DICER1, EPCAM (Deletion/duplication testing only), GREM1 (promoter region deletion/duplication testing only), KIT, MEN1, MLH1, MSH2, MSH3, MSH6, MUTYH, NBN, NF1, NHTL1, PALB2, PDGFRA, PMS2, POLD1, POLE, PTEN, RAD50, RAD51C, RAD51D,  SDHB, SDHC, SDHD, SMAD4, SMARCA4. STK11, TP53, TSC1, TSC2, and VHL.  The following genes were evaluated for sequence changes only: SDHA and HOXB13 c.251G>A variant only. The report date is 04/17/2019.      FAMILY HISTORY:  We obtained a detailed, 4-generation family history.  Significant diagnoses are listed below: Family History  Problem Relation Age of Onset  . Colon cancer Mother 60  . Colon cancer Maternal Grandmother   . Stroke Maternal Grandmother   . Breast cancer Maternal Aunt 40  . Cancer Maternal Aunt        ? ovarian  . Breast cancer Other 60       breast cancer, ovarian cancer  . Breast cancer Daughter 47  . Stroke Paternal Grandmother   . Prostate cancer Paternal Grandfather   . Heart attack Neg Hx   . Hypertension Neg Hx    [name] has 3 daughters and 1 son. Her daughter, [name], had breast cancer at 47 and reportedly negative genetic testing. Patient does not have siblings.  [name]'s mother had colon cancer in her 50's and died at 60. Patient had 2 maternal uncles, 2 maternal aunts. One of her aunts had breast cancer at 40. This aunt had a daughter   who had breast and ovarian cancer and died at 60. [name]'s other maternal aunt also had cancer, she believes ovarian. No other known cancers in maternal cousins. Her maternal grandmother had colon cancer and died in her 70s. Maternal grandfather died in  his 40s.   [name]'s father died at 88. Patient had 4 paternal uncles, 1 paternal aunt. No known cancers in paternal cousins. Paternal grandfather had prostate cancer and died at 60. Paternal grandmother died at 94.   [name] is aware of previous family history of genetic testing for hereditary cancer risks.  There is no reported Ashkenazi Jewish ancestry. There is no known consanguinity.   GENETIC TEST RESULTS: Genetic testing reported out on 04/17/2019 through the [facility] Breast Cancer STAT Panel + Common Hereditary cancer panel found no pathogenic mutations. The STAT Breast cancer panel offered by [facility] includes sequencing and rearrangement analysis for the following 9 genes:  ATM, BRCA1, BRCA2, CDH1, CHEK2, PALB2, PTEN, STK11 and TP53.  The Common Hereditary Cancers Panel offered by [facility] includes sequencing and/or deletion duplication testing of the following 47 genes: APC, ATM, AXIN2, BARD1, BMPR1A, BRCA1, BRCA2, BRIP1, CDH1, CDKN2A (p14ARF), CDKN2A (p16INK4a), CKD4, CHEK2, CTNNA1, DICER1, EPCAM (Deletion/duplication testing only), GREM1 (promoter region deletion/duplication testing only), KIT, MEN1, MLH1, MSH2, MSH3, MSH6, MUTYH, NBN, NF1, NHTL1, PALB2, PDGFRA, PMS2, POLD1, POLE, PTEN, RAD50, RAD51C, RAD51D, SDHB, SDHC, SDHD, SMAD4, SMARCA4. STK11, TP53, TSC1, TSC2, and VHL.  The following genes were evaluated for sequence changes only: SDHA and HOXB13 c.251G>A variant only. The test report has been scanned into EPIC and is located under the Molecular Pathology section of the Results Review tab.  A portion of the result report is included below for reference.     We discussed with [name] that because current genetic testing is not perfect, it is possible there may be a gene mutation in one of these genes that current testing cannot detect, but that chance is small.  We also discussed, that there could be another gene that has not yet been discovered, or that we have not yet  tested, that is responsible for the cancer diagnoses in the family. It is also possible there is a hereditary cause for the cancer in the family that [name] did not inherit and therefore was not identified in her testing.  Therefore, it is important to remain in touch with cancer genetics in the future so that we can continue to offer [name] the most up to date genetic testing.   ADDITIONAL GENETIC TESTING: We discussed with [name] that her genetic testing was fairly extensive.  If there are genes identified to increase cancer risk that can be analyzed in the future, we would be happy to discuss and coordinate this testing at that time.    CANCER SCREENING RECOMMENDATIONS: [name]'s test result is considered negative (normal).  This means that we have not identified a hereditary cause for her  personal and family history of cancer at this time. Most cancers happen by chance and this negative test suggests that her cancer may fall into this category.    While reassuring, this does not definitively rule out a hereditary predisposition to cancer. It is still possible that there could be genetic mutations that are undetectable by current technology. There could be genetic mutations in genes that have not been tested or identified to increase cancer risk.  Therefore, it is recommended she continue to follow the cancer management and screening guidelines provided by her oncology   and primary healthcare provider.   Given [name]'s personal and family histories, we must interpret these negative results with some caution.  Families with features suggestive of hereditary risk for cancer tend to have multiple family members with cancer, diagnoses in multiple generations and diagnoses before the age of 50. [name]'s family exhibits some of these features. Thus, this result may simply reflect our current inability to detect all mutations within these genes or there may be a different gene that  has not yet been discovered or tested.   An individual's cancer risk and medical management are not determined by genetic test results alone. Overall cancer risk assessment incorporates additional factors, including personal medical history, family history, and any available genetic information that may result in a personalized plan for cancer prevention and surveillance.  RECOMMENDATIONS FOR FAMILY MEMBERS:  Relatives in this family might be at some increased risk of developing cancer, over the general population risk, simply due to the family history of cancer.  We recommended female relatives in this family have a yearly mammogram beginning at age 40, or 10 years younger than the earliest onset of cancer, an annual clinical breast exam, and perform monthly breast self-exams. Female relatives in this family should also have a gynecological exam as recommended by their primary provider. All family members should have a colonoscopy by age 50, or as directed by their physicians.   It is also possible there is a hereditary cause for the cancer in [name]'s family that she did not inherit and therefore was not identified in her.  Based on [name]'s family history, we recommended her maternal relatives have genetic counseling and testing. [name] will let us know if we can be of any assistance in coordinating genetic counseling and/or testing for these family members.  FOLLOW-UP: Lastly, we discussed with [name] that cancer genetics is a rapidly advancing field and it is possible that new genetic tests will be appropriate for her and/or her family members in the future. We encouraged her to remain in contact with cancer genetics on an annual basis so we can update her personal and family histories and let her know of advances in cancer genetics that may benefit this family.   Our contact number was provided. [name]'s questions were answered to her satisfaction, and she knows she is  welcome to call us at anytime with additional questions or concerns.   Brianna Cowan, MS, LCGC Genetic Counselor [email or web address] Phone: [phone]  

## 2019-04-21 ENCOUNTER — Telehealth: Payer: Self-pay | Admitting: Hematology

## 2019-04-21 NOTE — Telephone Encounter (Signed)
Scheduled apt per 8/17 sch message- pt aware of appt date and time

## 2019-04-29 NOTE — Progress Notes (Signed)
[place], [place] - [address] [address] Phone: [phone] Fax: [phone]  [place], [place] [address] Phone: [phone] Fax: [phone]  [name], [place] - [address] [place] [address] Phone: [phone] Fax: [place], [place] [place] [place] [address] Phone: [phone] Fax: [phone]    Your procedure is scheduled on Monday, August 31st.  Report to [facility] Main Entrance "A" at 1:00 P.M., and check in at the Admitting office.  Call this number if you have problems the morning of surgery:  [phone]  Call [phone] if you have any questions prior to your surgery date Monday-Friday 8am-4pm   Remember:  Do not eat after midnight the night before your surgery  You may drink clear liquids until 12:00 the afternoon of your surgery.   Clear liquids allowed are: Water, Non-Citrus Juices (without pulp), Carbonated Beverages, Clear Tea, Black Coffee Only, and Gatorade    Take these medicines the morning of surgery with A SIP OF WATER  arformoterol (BROVANA) - nebulizer budesonide (PULMICORT) - nebulizer digoxin (LANOXIN) diltiazem (CARDIZEM CD) ipratropium (ATROVENT) - nebulizer pantoprazole (PROTONIX)  SIMBRINZA  -eye drops valACYclovir (VALTREX)   If needed - albuterol (PROVENTIL HFA;VENTOLIN HFA) 108 (90 Base)/inhaler  ---bring inhaler with you the day of surgery---  7 days prior to surgery STOP taking any Aspirin (unless otherwise instructed by your surgeon), Aleve, Naproxen, Ibuprofen, Motrin, Advil, Goody's, BC's, all herbal medications, fish oil, and all vitamins.   The Morning of Surgery  Do not wear jewelry, make-up or nail polish.  Do not wear lotions, powders, or  perfumes/colognes, or deodorant  Do not shave 48 hours prior to surgery.    Do not bring valuables to the hospital.  [facility] is not responsible for any belongings or valuables.  If you are a smoker, DO NOT Smoke 24 hours prior to surgery IF you wear a CPAP at night please bring your mask, tubing, and machine the morning of surgery   Remember that you must have someone to transport you home after your surgery, and remain with you for 24 hours if you are discharged the same day.  Contacts, glasses, hearing aids, dentures or bridgework may not be worn into surgery.   Leave your suitcase in the car.  After surgery it may be brought to your room.  For patients admitted to the hospital, discharge time will be determined by your treatment team.  Patients discharged the day of surgery will not be allowed to drive home.    Special instructions:   [facility]- Preparing For Surgery  Before surgery, you can play an important role. Because skin is not sterile, your skin needs to be as free of germs as possible. You can reduce the number of germs on your skin by washing with CHG (chlorahexidine gluconate) Soap before surgery.  CHG is an antiseptic cleaner which kills germs and bonds with the skin to continue killing germs even after washing.    Oral Hygiene is also important to reduce your risk of infection.  Remember - BRUSH YOUR TEETH THE MORNING OF SURGERY WITH YOUR REGULAR TOOTHPASTE  Please do not use if you have an allergy to CHG or antibacterial soaps. If your skin becomes reddened/irritated stop  using the CHG.  Do not shave (including legs and underarms) for at least 48 hours prior to first CHG shower. It is OK to shave your face.  Please follow these instructions carefully.   1. Shower the NIGHT BEFORE SURGERY and the MORNING OF SURGERY with CHG Soap.   2. If you chose to wash your hair, wash your hair first as usual with your normal shampoo.  3. After you shampoo, rinse your hair  and body thoroughly to remove the shampoo.  4. Use CHG as you would any other liquid soap. You can apply CHG directly to the skin and wash gently with a scrungie or a clean washcloth.   5. Apply the CHG Soap to your body ONLY FROM THE NECK DOWN.  Do not use on open wounds or open sores. Avoid contact with your eyes, ears, mouth and genitals (private parts). Wash Face and genitals (private parts)  with your normal soap.   6. Wash thoroughly, paying special attention to the area where your surgery will be performed.  7. Thoroughly rinse your body with warm water from the neck down.  8. DO NOT shower/wash with your normal soap after using and rinsing off the CHG Soap.  9. Pat yourself dry with a CLEAN TOWEL.  10. Wear CLEAN PAJAMAS to bed the night before surgery, wear comfortable clothes the morning of surgery  11. Place CLEAN SHEETS on your bed the night of your first shower and DO NOT SLEEP WITH PETS.  Day of Surgery: Do not apply any deodorants/lotions. Please shower the morning of surgery with the CHG soap  Please wear clean clothes to the hospital/surgery center.   Remember to brush your teeth WITH YOUR REGULAR TOOTHPASTE.  Please read over the following fact sheets that you were given.

## 2019-04-30 ENCOUNTER — Encounter (HOSPITAL_COMMUNITY): Payer: Self-pay

## 2019-04-30 ENCOUNTER — Other Ambulatory Visit (HOSPITAL_COMMUNITY)
Admission: RE | Admit: 2019-04-30 | Discharge: 2019-04-30 | Disposition: A | Discharge status: Home / Self Care | Payer: Medicare Other | Source: Ambulatory Visit | Attending: General Surgery | Admitting: General Surgery

## 2019-04-30 ENCOUNTER — Other Ambulatory Visit: Payer: Self-pay

## 2019-04-30 ENCOUNTER — Encounter (HOSPITAL_COMMUNITY)
Admission: RE | Admit: 2019-04-30 | Discharge: 2019-04-30 | Disposition: A | Discharge status: Home / Self Care | Payer: Medicare Other | Source: Ambulatory Visit | Attending: General Surgery | Admitting: General Surgery

## 2019-04-30 DIAGNOSIS — Z01812 Encounter for preprocedural laboratory examination: Secondary | ICD-10-CM | POA: Diagnosis not present

## 2019-04-30 DIAGNOSIS — Z20828 Contact with and (suspected) exposure to other viral communicable diseases: Secondary | ICD-10-CM | POA: Insufficient documentation

## 2019-04-30 LAB — BASIC METABOLIC PANEL
Anion gap: 10 (ref 5–15)
BUN: 17 mg/dL (ref 8–23)
CO2: 25 mmol/L (ref 22–32)
Calcium: 8.7 mg/dL — ABNORMAL LOW (ref 8.9–10.3)
Chloride: 103 mmol/L (ref 98–111)
Creatinine, Ser: 0.68 mg/dL (ref 0.44–1.00)
GFR calc Af Amer: >60 mL/min (ref >60)
GFR calc non Af Amer: >60 mL/min (ref >60)
Glucose, Bld: 89 mg/dL (ref 70–99)
Potassium: 4.1 mmol/L (ref 3.5–5.1)
Sodium: 138 mmol/L (ref 135–145)

## 2019-04-30 LAB — CBC
HCT: 43.8 % (ref 36.0–46.0)
Hemoglobin: 14.1 g/dL (ref 12.0–15.0)
MCH: 33.3 pg (ref 26.0–34.0)
MCHC: 32.2 g/dL (ref 30.0–36.0)
MCV: 103.3 fL — ABNORMAL HIGH (ref 80.0–100.0)
Platelets: 230 10*3/uL (ref 150–400)
RBC: 4.24 MIL/uL (ref 3.87–5.11)
RDW: 14.8 % (ref 11.5–15.5)
WBC: 9.6 10*3/uL (ref 4.0–10.5)
nRBC: 0 % (ref 0.0–0.2)

## 2019-04-30 LAB — SARS CORONAVIRUS 2 (TAT 6-24 HRS): SARS Coronavirus 2: NEGATIVE

## 2019-04-30 NOTE — Progress Notes (Signed)
  Coronavirus Screening COVID test done today Have you experienced the following symptoms:  Cough yes/no: No Fever (>100.46F)  yes/no: No Runny nose yes/no: No Sore throat yes/no: No Have you or a family member traveled in the last 14 days and where? yes/no: No  PCP - Dr [name]  Cardiologist - Dr Stanford Breed  Pulmonologist-Dr Elsworth Soho  Urologist- Dr Tresa Moore  Chest x-ray -   EKG - 01-02-19  Stress Test - denies  ECHO - 02-07-15  Cardiac Cath - denies  AICD-denies PM-denies LOOP-denies  Sleep Study - denies CPAP - NA. Pt on Home O2 -2L  LABS-CBC,BMP  ASA-denies ERAS-NA  HA1C-denies Fasting Blood Sugar -  Checks Blood Sugar _____ times a day  Anesthesia-Y. H/O stroke,CAD, Afib. Pt denies having chest pain, sob, or fever at this time. All instructions explained to the pt, with a verbal understanding of the material. Pt agrees to go over the instructions while at home for a better understanding. Pt also instructed to self quarantine after being tested for COVID-19. The opportunity to ask questions was provided.

## 2019-05-01 DIAGNOSIS — B0052 Herpesviral keratitis: Secondary | ICD-10-CM | POA: Diagnosis not present

## 2019-05-01 NOTE — Anesthesia Preprocedure Evaluation (Addendum)
Anesthesia Evaluation  Patient identified by MRN, date of birth, ID band Patient awake    Reviewed: Allergy & Precautions, NPO status , Patient's Chart, lab work & pertinent test results  Airway Mallampati: II  TM Distance: >3 FB Neck ROM: Full    Dental  (+) Edentulous Upper, Edentulous Lower   Pulmonary former smoker,     + decreased breath sounds      Cardiovascular  Rhythm:Irregular Rate:Normal     Neuro/Psych    GI/Hepatic   Endo/Other    Renal/GU      Musculoskeletal   Abdominal   Peds  Hematology   Anesthesia Other Findings   Reproductive/Obstetrics                            Anesthesia Physical Anesthesia Plan  ASA: III  Anesthesia Plan: General   Post-op Pain Management:  Regional for Post-op pain   Induction: Intravenous  PONV Risk Score and Plan: Ondansetron and Dexamethasone  Airway Management Planned: Natural Airway and Simple Face Mask  Additional Equipment:   Intra-op Plan:   Post-operative Plan:   Informed Consent: I have reviewed the patients History and Physical, chart, labs and discussed the procedure including the risks, benefits and alternatives for the proposed anesthesia with the patient or authorized representative who has indicated his/her understanding and acceptance.     Dental advisory given  Plan Discussed with: CRNA and Anesthesiologist  Anesthesia Plan Comments: (Follows with cardiology for hx of atrial fibrillation not on anticoagulation due to prior history of intracranial hemorrhage in 2016.  Follows with pulmonology for COPD with chronic resp failure on 2L O2.  Pulm clearance per telephone encounter 04/09/19: "She does have chronic respiratory failure requiring oxygen and is at moderate risk for postop pulmonary complications. We can clear her for surgery with this caveat."  Cardiac clearance per telephone encounter 04/13/19: "LUCREZIA DEHNE was last seen on 01/02/19 by Dr. Stanford Breed.  Since that day, ENEZ MONAHAN has done well. Her mobility is limited by her prior strokes and home oxygen needs. She can still complete nearly 4.0 METS without angina. No history of myocardial infarction. Her main surgical risks are exacerbation of her atrial fibrillation and complications stemming from her pulmonary status. She understands that she is at increased, but acceptable, risk for planned procedure. Per the patient, the surgeon will also reach out to her pulmonologist for clearance.  Therefore, based on ACC/AHA guidelines, the patient would be at acceptable risk for the planned procedure without further cardiovascular testing."  Spirometry 02/2018  ratio 48, FEV1 37% and FVC 57% consistent with severe obstruction  TTE 02/07/2015: - Procedure narrative: Transthoracic echocardiography. Image   quality was poor. The study was technically difficult, as a   result of poor acoustic windows, poor sound wave transmission,   and restricted patient mobility. - Left ventricle: The cavity size was normal. Wall thickness was   increased in a pattern of mild LVH. Systolic function was   vigorous. The estimated ejection fraction was in the range of 65%   to 70%. Wall motion was normal; there were no regional wall   motion abnormalities. Doppler parameters are consistent with   abnormal left ventricular relaxation (grade 1 diastolic   dysfunction). The E/e&' ratio is between 8-15, suggesting   indeterminate LV filling pressure. - Aortic valve: Poorly visualized. There was no stenosis. There was   no regurgitation. - Mitral valve: Calcified annulus. There was trivial  regurgitation. - Left atrium: The atrium was normal in size. - Right atrium: The atrium was mildly dilated.  Impressions:  - Although difficult to compare, there do not appear to be   significant changes compared to the echo in 2010.  )      Anesthesia Quick Evaluation

## 2019-05-02 NOTE — H&P (Signed)
[name] Location: [facility] Patient #: [id number] DOB: 09-Aug-1935 Undefined / Language: Suszanne Dudley / Race: White Female      History of Present Illness     . This is a very pleasant 84 year old female who is seen at the [facility] today because of a new diagnosis of left breast cancer. she was referred by Emmit Pomfret at [facility] mammography. Dr. Burr Medico and Dr. Lisbeth Renshaw and I saw the patient. [name] is her PCP. Edwinna Areola is her gynecologist. Dr. Elsworth Soho is her pulmonologist. Dr. Stanford Breed is her cardiologist. the nursing technician from the [facility] served as my chaperone.      Significant past history of right breast cancer and right total mastectomy and sentinel node biopsy in 2004 by Dr. Margot Chimes. 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 There's been no local recurrence. She recently felt a lump in the left breast, outer, slightly upper. Imaging studies showed indistinct deformity in the upper outer quadrant of the left breast, 1.6 cm in diameter. The left axilla was negative by ultrasound. Exam revealed that there was a larger harder mass, 3-4 cm in size. Image guided biopsy of the left breast mass shows invasive lobular carcinoma, grade 2. HER-2/neu negative. ER 100%. PR 30%. Proliferation index 20%.      Past history is significant for oxygen-dependent COPD. 2 L a day. She is otherwise independent and drives her own car. She is followed by Dr. Elsworth Soho. She had intracranial hemorrhage, right occipital area, 2016, head craniotomy. She recovered from that. She has atrial fibrillation but she is not a candidate for anticoagulation because of intracranial hemorrhage. 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 Hypertension. CHF. History right total mastectomy for extensive DCIS, 2004. No local recurrence     Family history significant for breast cancer in her daughter in her 1s.  Treated in [place]. Genetic testing negative. Maternal aunt breast cancer in her 5s. Maternal grandmother had colon cancer. Mother had colon cancer. Social history reveals she is married. Her husband [name] is a Research officer, trade union here in town. They live in [address]. She denies alcohol.      We talked for a long time about management of her left breast cancer, especially in light of her prior right mastectomy and comorbidities. She is motivated to have surgery to remove the cancer from her body and achieve definitive surgery. The real question is whether her cardiac or pulmonary disease or a prohibitive risk. This is not clear to me as she seems to be doing fairly well other than being oxygen dependent. Surgical goal would be to do a left simple mastectomy. I can do this under LMA anesthesia with pectoral block. Alternatively I could do it under monitored sedation with ketamine and propofol and pectoral block and local anesthesia. I would need to discuss the best option with anesthesia Although this is a lobular cancer, I think we can forego the MRI since the right breast has previously been removed and we plan a mastectomy/subtotal mastectomy on the left, followed by an antiestrogen therapy as an adjunct.      She will be referred to Dr. Elsworth Soho and [facility] further risk assessment and clearance for surgery If she cannot be cleared for surgery the only other option is chronic antiestrogen therapy and follow closely  Plan: Referred to pulmonary medicine cardiology for risk assessment and clearance for left mastectomy Consider preoperative anesthesia consult to decide whether  LMA or monitor sedation with regional block would be best Genetic counseling and testing is offered Hold off on MRI for reasons stated above As soon as we hear from cardiology and pulmonary medicine we will finalize the surgical plan      Physical Exam  General Mental Status-Alert. General  Appearance-Consistent with stated age. Hydration-Well hydrated. Voice-Normal. Note: Able to ambulate from chair to exam table and back again independently. Nasal O2. Mental status excellent. She has good recent and remote memory. Insight excellent. Cognition normal. In no distress   Head and Neck Head-normocephalic, atraumatic with no lesions or palpable masses. Trachea-midline. Thyroid Gland Characteristics - normal size and consistency.  Eye Eyeball - Bilateral-Extraocular movements intact. Sclera/Conjunctiva - Bilateral-No scleral icterus.  Chest and Lung Exam Chest and lung exam reveals -quiet, even and easy respiratory effort with no use of accessory muscles and on auscultation, normal breath sounds, no adventitious sounds and normal vocal resonance. Inspection Chest Wall - Normal. Back - normal.  Breast Note: Transverse right mastectomy incision. Well healed. No evidence of local recurrence Tiny erythematous nevi above midpoint of right mastectomy incision and a second erythematous nevus at medial edge of mastectomy incision. No axillary adenopathy on the right The left breast is not large. Some ecchymoses. 3-4 cm mass in the left breast at about the 2:30 position just outside the areola. This quite mobile and not fixed to the chest wall. No axillary adenopathy.   Cardiovascular Cardiovascular examination reveals -normal pedal pulses bilaterally. Note: Rate controlled. Somewhat irregular pulses. Auscultation Rhythm - Regularly irregular. Note: No loud murmurs.   Abdomen Inspection Inspection of the abdomen reveals - No Hernias. Skin - Scar - no surgical scars. Palpation/Percussion Palpation and Percussion of the abdomen reveal - Soft, Non Tender, No Rebound tenderness, No Rigidity (guarding) and No hepatosplenomegaly. Auscultation Auscultation of the abdomen reveals - Bowel sounds normal.  Neurologic Neurologic evaluation reveals -alert and  oriented x 3 with no impairment of recent or remote memory. Mental Status-Normal.  Musculoskeletal Normal Exam - Left-Upper Extremity Strength Normal and Lower Extremity Strength Normal. Normal Exam - Right-Upper Extremity Strength Normal and Lower Extremity Strength Normal.  Lymphatic Head & Neck  General Head & Neck Lymphatics: Bilateral - Description - Normal. Axillary  General Axillary Region: Bilateral - Description - Normal. Tenderness - Non Tender. Femoral & Inguinal  Generalized Femoral & Inguinal Lymphatics: Bilateral - Description - Normal. Tenderness - Non Tender.    Assessment & Plan   PRIMARY CANCER OF UPPER OUTER QUADRANT OF LEFT FEMALE BREAST (C50.412)  you underwent right total mastectomy in 2004. There is no evidence of recurrence on the right side  you have developed a relatively large cancer in the left breast, upper outer quadrant, 3-4 cm in size I think that the most straightforward surgical treatment should be left total mastectomy You have significant lung disease and also some heart disease. We need to be sure that your cardiologist and your pulmonary medicine doctor feel that this is acceptable risk  There are a variety of anesthesia techniques that can be chosen to protect your heart and lungs  The next step is to ask for risk assessment and clearance from Dr. Stanford Breed, your cardiologist, and Dr. Elsworth Soho, your pulmonologist Once we know their opinion we will go ahead with the surgery after discussion with the anesthesia doctors  If, however, your cardiologist or pulmonologist think that you are not a candidate for any type of anesthesia, then we would simply start you on antiestrogen pills  and keep a close eye on the cancer. We are also hoping that we can get the surgery done, which is the most definitive treatment Return to see Dr. Dalbert Batman in 1-2 weeks, after we have heard from your cardiologist and pulmonologist. We will go ahead with surgery in  the near future, if this is deemed an acceptable risk.   HISTORY OF MASTECTOMY, RIGHT (Z90.11)  COPD, SEVERE (J44.9) Impression: Oxygen dependent. 2 L/m, otherwise independent and driving her car Followed by Dr. Elsworth Soho  ATRIAL FIBRILLATION, CHRONIC (I48.20)  HISTORY OF INTRACRANIAL HEMORRHAGE (Z86.79) Impression: 2016. Right occipital craniotomy  RECURRENT BLADDER PAPILLARY CARCINOMA (C67.9)  HYPERTENSION, ESSENTIAL (I10)  CHF, CHRONIC (I50.9)  FAMILY HISTORY OF BREAST CANCER (Z80.3) Impression: Maternal aunt in her 72s. Daughter in her 47s. Daughter's genetic testing was negative.  FAMILY HISTORY OF COLON CANCER (Z80.0) Impression: Mother and maternal grandmother     Edsel Petrin. Dalbert Batman, M.D., [facility] General and Minimally invasive Surgery Breast and Colorectal Surgery Office:   [phone] Pager:   [phone]

## 2019-05-04 ENCOUNTER — Other Ambulatory Visit: Payer: Self-pay

## 2019-05-04 ENCOUNTER — Encounter (HOSPITAL_COMMUNITY): Payer: Self-pay | Admitting: Certified Registered Nurse Anesthetist

## 2019-05-04 ENCOUNTER — Ambulatory Visit (HOSPITAL_COMMUNITY): Payer: Medicare Other | Admitting: Physician Assistant

## 2019-05-04 ENCOUNTER — Ambulatory Visit (HOSPITAL_COMMUNITY): Payer: Medicare Other | Admitting: Certified Registered Nurse Anesthetist

## 2019-05-04 ENCOUNTER — Encounter (HOSPITAL_COMMUNITY)
Admission: RE | Disposition: A | Discharge status: Home / Self Care | Payer: Self-pay | Source: Home / Self Care | Attending: General Surgery

## 2019-05-04 ENCOUNTER — Observation Stay (HOSPITAL_COMMUNITY)
Admission: RE | Admit: 2019-05-04 | Discharge: 2019-05-05 | Disposition: A | Discharge status: Home / Self Care | Payer: Medicare Other | Attending: General Surgery | Admitting: General Surgery

## 2019-05-04 DIAGNOSIS — J449 Chronic obstructive pulmonary disease, unspecified: Secondary | ICD-10-CM | POA: Diagnosis present

## 2019-05-04 DIAGNOSIS — Z9981 Dependence on supplemental oxygen: Secondary | ICD-10-CM | POA: Insufficient documentation

## 2019-05-04 DIAGNOSIS — Z79899 Other long term (current) drug therapy: Secondary | ICD-10-CM | POA: Diagnosis not present

## 2019-05-04 DIAGNOSIS — Z800 Family history of malignant neoplasm of digestive organs: Secondary | ICD-10-CM | POA: Diagnosis not present

## 2019-05-04 DIAGNOSIS — E785 Hyperlipidemia, unspecified: Secondary | ICD-10-CM | POA: Diagnosis not present

## 2019-05-04 DIAGNOSIS — I4891 Unspecified atrial fibrillation: Secondary | ICD-10-CM | POA: Diagnosis present

## 2019-05-04 DIAGNOSIS — I5042 Chronic combined systolic (congestive) and diastolic (congestive) heart failure: Secondary | ICD-10-CM | POA: Diagnosis present

## 2019-05-04 DIAGNOSIS — K219 Gastro-esophageal reflux disease without esophagitis: Secondary | ICD-10-CM | POA: Diagnosis not present

## 2019-05-04 DIAGNOSIS — Z8551 Personal history of malignant neoplasm of bladder: Secondary | ICD-10-CM | POA: Diagnosis not present

## 2019-05-04 DIAGNOSIS — Z96659 Presence of unspecified artificial knee joint: Secondary | ICD-10-CM | POA: Diagnosis not present

## 2019-05-04 DIAGNOSIS — Z87891 Personal history of nicotine dependence: Secondary | ICD-10-CM | POA: Diagnosis not present

## 2019-05-04 DIAGNOSIS — G8918 Other acute postprocedural pain: Secondary | ICD-10-CM | POA: Diagnosis not present

## 2019-05-04 DIAGNOSIS — C679 Malignant neoplasm of bladder, unspecified: Secondary | ICD-10-CM | POA: Diagnosis present

## 2019-05-04 DIAGNOSIS — Z853 Personal history of malignant neoplasm of breast: Secondary | ICD-10-CM | POA: Insufficient documentation

## 2019-05-04 DIAGNOSIS — Z803 Family history of malignant neoplasm of breast: Secondary | ICD-10-CM | POA: Diagnosis not present

## 2019-05-04 DIAGNOSIS — Z888 Allergy status to other drugs, medicaments and biological substances status: Secondary | ICD-10-CM | POA: Insufficient documentation

## 2019-05-04 DIAGNOSIS — I482 Chronic atrial fibrillation, unspecified: Secondary | ICD-10-CM | POA: Diagnosis not present

## 2019-05-04 DIAGNOSIS — I110 Hypertensive heart disease with heart failure: Secondary | ICD-10-CM | POA: Insufficient documentation

## 2019-05-04 DIAGNOSIS — Z885 Allergy status to narcotic agent status: Secondary | ICD-10-CM | POA: Diagnosis not present

## 2019-05-04 DIAGNOSIS — Z9011 Acquired absence of right breast and nipple: Secondary | ICD-10-CM | POA: Insufficient documentation

## 2019-05-04 DIAGNOSIS — C50912 Malignant neoplasm of unspecified site of left female breast: Secondary | ICD-10-CM | POA: Diagnosis present

## 2019-05-04 DIAGNOSIS — C50412 Malignant neoplasm of upper-outer quadrant of left female breast: Secondary | ICD-10-CM | POA: Diagnosis not present

## 2019-05-04 DIAGNOSIS — Z8673 Personal history of transient ischemic attack (TIA), and cerebral infarction without residual deficits: Secondary | ICD-10-CM | POA: Insufficient documentation

## 2019-05-04 DIAGNOSIS — I10 Essential (primary) hypertension: Secondary | ICD-10-CM | POA: Diagnosis not present

## 2019-05-04 DIAGNOSIS — L821 Other seborrheic keratosis: Secondary | ICD-10-CM | POA: Insufficient documentation

## 2019-05-04 DIAGNOSIS — Z170 Estrogen receptor positive status [ER+]: Secondary | ICD-10-CM

## 2019-05-04 HISTORY — PX: TOTAL MASTECTOMY: SHX6129

## 2019-05-04 LAB — CBC
HCT: 37.9 % (ref 36.0–46.0)
Hemoglobin: 12.1 g/dL (ref 12.0–15.0)
MCH: 32.8 pg (ref 26.0–34.0)
MCHC: 31.9 g/dL (ref 30.0–36.0)
MCV: 102.7 fL — ABNORMAL HIGH (ref 80.0–100.0)
Platelets: 170 10*3/uL (ref 150–400)
RBC: 3.69 MIL/uL — ABNORMAL LOW (ref 3.87–5.11)
RDW: 14.5 % (ref 11.5–15.5)
WBC: 7.4 10*3/uL (ref 4.0–10.5)
nRBC: 0 % (ref 0.0–0.2)

## 2019-05-04 LAB — CREATININE, SERUM
Creatinine, Ser: 0.68 mg/dL (ref 0.44–1.00)
GFR calc Af Amer: >60 mL/min (ref >60)
GFR calc non Af Amer: >60 mL/min (ref >60)

## 2019-05-04 SURGERY — MASTECTOMY, SIMPLE
Anesthesia: Monitor Anesthesia Care | Site: Breast | Laterality: Left

## 2019-05-04 MED ORDER — CEFAZOLIN SODIUM-DEXTROSE 2-4 GM/100ML-% IV SOLN
INTRAVENOUS | Status: AC
  Filled 2019-05-04: qty 100

## 2019-05-04 MED ORDER — PROPOFOL 10 MG/ML IV BOLUS
INTRAVENOUS | Status: AC
  Filled 2019-05-04: qty 20

## 2019-05-04 MED ORDER — CHLORHEXIDINE GLUCONATE CLOTH 2 % EX PADS: 6.0000 | MEDICATED_PAD | Freq: Once | CUTANEOUS | Status: DC

## 2019-05-04 MED ORDER — PROPOFOL 10 MG/ML IV BOLUS
INTRAVENOUS | Status: DC | PRN
  Administered 2019-05-04: 90 mg via INTRAVENOUS

## 2019-05-04 MED ORDER — PHENYLEPHRINE 40 MCG/ML (10ML) SYRINGE FOR IV PUSH (FOR BLOOD PRESSURE SUPPORT)
PREFILLED_SYRINGE | INTRAVENOUS | Status: DC | PRN
  Administered 2019-05-04: 120 ug via INTRAVENOUS

## 2019-05-04 MED ORDER — FENTANYL CITRATE (PF) 250 MCG/5ML IJ SOLN
INTRAMUSCULAR | Status: DC | PRN
  Administered 2019-05-04 (×3): 25 ug via INTRAVENOUS

## 2019-05-04 MED ORDER — ONDANSETRON HCL 4 MG/2ML IJ SOLN
INTRAMUSCULAR | Status: DC | PRN
  Administered 2019-05-04: 4 mg via INTRAVENOUS

## 2019-05-04 MED ORDER — ACETAMINOPHEN 500 MG PO TABS
1000.0000 mg | ORAL_TABLET | ORAL | Status: AC
  Administered 2019-05-04: 14:00:00 1000 mg via ORAL

## 2019-05-04 MED ORDER — ENOXAPARIN SODIUM 30 MG/0.3ML ~~LOC~~ SOLN: 30.0000 mg | SUBCUTANEOUS | Status: DC

## 2019-05-04 MED ORDER — ACETAMINOPHEN 500 MG PO TABS
ORAL_TABLET | ORAL | Status: AC
  Administered 2019-05-04: 1000 mg via ORAL
  Filled 2019-05-04: qty 2

## 2019-05-04 MED ORDER — DIGOXIN 125 MCG PO TABS
125.0000 ug | ORAL_TABLET | Freq: Every day | ORAL | Status: DC
  Administered 2019-05-05: 125 ug via ORAL
  Filled 2019-05-04: qty 1

## 2019-05-04 MED ORDER — GABAPENTIN 300 MG PO CAPS
300.0000 mg | ORAL_CAPSULE | ORAL | Status: AC
  Administered 2019-05-04: 14:00:00 300 mg via ORAL

## 2019-05-04 MED ORDER — IPRATROPIUM BROMIDE 0.02 % IN SOLN
0.2500 mg | Freq: Two times a day (BID) | RESPIRATORY_TRACT | Status: DC
  Administered 2019-05-04: 21:00:00 0.5 mg via RESPIRATORY_TRACT
  Filled 2019-05-04 (×2): qty 2.5

## 2019-05-04 MED ORDER — ALBUMIN HUMAN 5 % IV SOLN
12.5000 g | Freq: Once | INTRAVENOUS | Status: AC
  Administered 2019-05-04: 16:00:00 12.5 g via INTRAVENOUS

## 2019-05-04 MED ORDER — 0.9 % SODIUM CHLORIDE (POUR BTL) OPTIME
TOPICAL | Status: DC | PRN
  Administered 2019-05-04: 15:00:00 1000 mL

## 2019-05-04 MED ORDER — LIDOCAINE 2% (20 MG/ML) 5 ML SYRINGE
INTRAMUSCULAR | Status: AC
  Filled 2019-05-04: qty 5

## 2019-05-04 MED ORDER — LACTATED RINGERS IV SOLN
INTRAVENOUS | Status: DC
  Administered 2019-05-04: 22:00:00 via INTRAVENOUS

## 2019-05-04 MED ORDER — BUDESONIDE 0.5 MG/2ML IN SUSP
0.5000 mg | Freq: Two times a day (BID) | RESPIRATORY_TRACT | Status: DC
  Administered 2019-05-04: 0.5 mg via RESPIRATORY_TRACT
  Filled 2019-05-04 (×2): qty 2

## 2019-05-04 MED ORDER — FENTANYL CITRATE (PF) 100 MCG/2ML IJ SOLN
INTRAMUSCULAR | Status: AC
  Administered 2019-05-04: 14:00:00 50 ug via INTRAVENOUS
  Filled 2019-05-04: qty 2

## 2019-05-04 MED ORDER — FENTANYL CITRATE (PF) 250 MCG/5ML IJ SOLN
INTRAMUSCULAR | Status: AC
  Filled 2019-05-04: qty 5

## 2019-05-04 MED ORDER — PANTOPRAZOLE SODIUM 40 MG PO TBEC
40.0000 mg | DELAYED_RELEASE_TABLET | Freq: Every day | ORAL | Status: DC
  Administered 2019-05-05: 10:00:00 40 mg via ORAL
  Filled 2019-05-04: qty 1

## 2019-05-04 MED ORDER — HYDROCODONE-ACETAMINOPHEN 5-325 MG PO TABS
1.0000 | ORAL_TABLET | ORAL | Status: DC | PRN
  Filled 2019-05-04: qty 2

## 2019-05-04 MED ORDER — MIDAZOLAM HCL 2 MG/2ML IJ SOLN
1.0000 mg | Freq: Once | INTRAMUSCULAR | Status: AC
  Administered 2019-05-04: 14:00:00 1 mg via INTRAVENOUS

## 2019-05-04 MED ORDER — EPHEDRINE SULFATE-NACL 50-0.9 MG/10ML-% IV SOSY
PREFILLED_SYRINGE | INTRAVENOUS | Status: DC | PRN
  Administered 2019-05-04: 15 mg via INTRAVENOUS
  Administered 2019-05-04: 10 mg via INTRAVENOUS
  Administered 2019-05-04: 15 mg via INTRAVENOUS

## 2019-05-04 MED ORDER — DEXAMETHASONE SODIUM PHOSPHATE 10 MG/ML IJ SOLN
INTRAMUSCULAR | Status: AC
  Filled 2019-05-04: qty 1

## 2019-05-04 MED ORDER — GABAPENTIN 300 MG PO CAPS
ORAL_CAPSULE | ORAL | Status: AC
  Administered 2019-05-04: 300 mg via ORAL
  Filled 2019-05-04: qty 1

## 2019-05-04 MED ORDER — ALBUTEROL SULFATE HFA 108 (90 BASE) MCG/ACT IN AERS: 2.0000 | INHALATION_SPRAY | Freq: Four times a day (QID) | RESPIRATORY_TRACT | Status: DC | PRN

## 2019-05-04 MED ORDER — ONDANSETRON HCL 4 MG/2ML IJ SOLN
INTRAMUSCULAR | Status: AC
  Filled 2019-05-04: qty 4

## 2019-05-04 MED ORDER — FENTANYL CITRATE (PF) 100 MCG/2ML IJ SOLN
50.0000 ug | Freq: Once | INTRAMUSCULAR | Status: AC
  Administered 2019-05-04: 14:00:00 50 ug via INTRAVENOUS

## 2019-05-04 MED ORDER — FENTANYL CITRATE (PF) 250 MCG/5ML IJ SOLN: INTRAMUSCULAR | Status: DC | PRN

## 2019-05-04 MED ORDER — SENNA 8.6 MG PO TABS
1.0000 | ORAL_TABLET | Freq: Two times a day (BID) | ORAL | Status: DC
  Administered 2019-05-05: 8.6 mg via ORAL
  Filled 2019-05-04 (×2): qty 1

## 2019-05-04 MED ORDER — DEXAMETHASONE SODIUM PHOSPHATE 10 MG/ML IJ SOLN
INTRAMUSCULAR | Status: DC | PRN
  Administered 2019-05-04: 5 mg via INTRAVENOUS

## 2019-05-04 MED ORDER — GLYCOPYRROLATE PF 0.2 MG/ML IJ SOSY
PREFILLED_SYRINGE | INTRAMUSCULAR | Status: DC | PRN
  Administered 2019-05-04: .2 mg via INTRAVENOUS

## 2019-05-04 MED ORDER — SODIUM CHLORIDE 0.9 % IV SOLN
INTRAVENOUS | Status: DC | PRN
  Administered 2019-05-04: 60 ug/min via INTRAVENOUS

## 2019-05-04 MED ORDER — VALACYCLOVIR HCL 500 MG PO TABS
1000.0000 mg | ORAL_TABLET | Freq: Every day | ORAL | Status: DC
  Administered 2019-05-04 – 2019-05-05 (×2): 1000 mg via ORAL
  Filled 2019-05-04 (×2): qty 2

## 2019-05-04 MED ORDER — DILTIAZEM HCL ER COATED BEADS 240 MG PO CP24
240.0000 mg | ORAL_CAPSULE | Freq: Every day | ORAL | Status: DC
  Administered 2019-05-05: 10:00:00 240 mg via ORAL
  Filled 2019-05-04: qty 1

## 2019-05-04 MED ORDER — EPHEDRINE 5 MG/ML INJ
INTRAVENOUS | Status: AC
  Filled 2019-05-04: qty 10

## 2019-05-04 MED ORDER — TRAMADOL HCL 50 MG PO TABS: 50.0000 mg | ORAL_TABLET | Freq: Four times a day (QID) | ORAL | Status: DC | PRN

## 2019-05-04 MED ORDER — ALBUTEROL SULFATE (2.5 MG/3ML) 0.083% IN NEBU: 2.5000 mg | INHALATION_SOLUTION | Freq: Four times a day (QID) | RESPIRATORY_TRACT | Status: DC | PRN

## 2019-05-04 MED ORDER — CEFAZOLIN SODIUM-DEXTROSE 2-4 GM/100ML-% IV SOLN
2.0000 g | INTRAVENOUS | Status: AC
  Administered 2019-05-04: 15:00:00 2 g via INTRAVENOUS

## 2019-05-04 MED ORDER — ARFORMOTEROL TARTRATE 15 MCG/2ML IN NEBU
15.0000 ug | INHALATION_SOLUTION | Freq: Two times a day (BID) | RESPIRATORY_TRACT | Status: DC
  Administered 2019-05-04: 21:00:00 15 ug via RESPIRATORY_TRACT
  Filled 2019-05-04 (×2): qty 2

## 2019-05-04 MED ORDER — ONDANSETRON HCL 4 MG/2ML IJ SOLN: 4.0000 mg | Freq: Four times a day (QID) | INTRAMUSCULAR | Status: DC | PRN

## 2019-05-04 MED ORDER — FENTANYL CITRATE (PF) 100 MCG/2ML IJ SOLN: 25.0000 ug | INTRAMUSCULAR | Status: DC | PRN

## 2019-05-04 MED ORDER — LIDOCAINE 2% (20 MG/ML) 5 ML SYRINGE
INTRAMUSCULAR | Status: DC | PRN
  Administered 2019-05-04: 20 mg via INTRAVENOUS

## 2019-05-04 MED ORDER — LACTATED RINGERS IV SOLN
INTRAVENOUS | Status: DC
  Administered 2019-05-04: 14:00:00 via INTRAVENOUS

## 2019-05-04 MED ORDER — PHENYLEPHRINE 40 MCG/ML (10ML) SYRINGE FOR IV PUSH (FOR BLOOD PRESSURE SUPPORT)
PREFILLED_SYRINGE | INTRAVENOUS | Status: AC
  Filled 2019-05-04: qty 10

## 2019-05-04 MED ORDER — MIDAZOLAM HCL 2 MG/2ML IJ SOLN
INTRAMUSCULAR | Status: AC
  Administered 2019-05-04: 14:00:00 1 mg via INTRAVENOUS
  Filled 2019-05-04: qty 2

## 2019-05-04 MED ORDER — ONDANSETRON HCL 4 MG/2ML IJ SOLN: 4.0000 mg | Freq: Once | INTRAMUSCULAR | Status: DC | PRN

## 2019-05-04 MED ORDER — FENTANYL CITRATE (PF) 100 MCG/2ML IJ SOLN: 12.5000 ug | INTRAMUSCULAR | Status: DC | PRN

## 2019-05-04 MED ORDER — ALBUMIN HUMAN 5 % IV SOLN
INTRAVENOUS | Status: AC
  Administered 2019-05-04: 12.5 g via INTRAVENOUS
  Filled 2019-05-04: qty 250

## 2019-05-04 MED ORDER — LATANOPROST 0.005 % OP SOLN
1.0000 [drp] | Freq: Every day | OPHTHALMIC | Status: DC
  Filled 2019-05-04: qty 2.5

## 2019-05-04 MED ORDER — ATORVASTATIN CALCIUM 10 MG PO TABS: 10.0000 mg | ORAL_TABLET | Freq: Every day | ORAL | Status: DC

## 2019-05-04 MED ORDER — CEFAZOLIN SODIUM-DEXTROSE 2-4 GM/100ML-% IV SOLN
2.0000 g | Freq: Three times a day (TID) | INTRAVENOUS | Status: AC
  Administered 2019-05-04: 2 g via INTRAVENOUS
  Filled 2019-05-04: qty 100

## 2019-05-04 MED ORDER — ONDANSETRON 4 MG PO TBDP: 4.0000 mg | ORAL_TABLET | Freq: Four times a day (QID) | ORAL | Status: DC | PRN

## 2019-05-04 SURGICAL SUPPLY — 48 items
ADH SKN CLS APL DERMABOND .7 (GAUZE/BANDAGES/DRESSINGS) ×1
APL PRP STRL LF DISP 70% ISPRP (MISCELLANEOUS) ×1
APPLIER CLIP 9.375 MED OPEN (MISCELLANEOUS) ×3
APR CLP MED 9.3 20 MLT OPN (MISCELLANEOUS) ×1
BINDER BREAST LRG (GAUZE/BANDAGES/DRESSINGS) IMPLANT
BINDER BREAST MEDIUM (GAUZE/BANDAGES/DRESSINGS) ×2 IMPLANT
BINDER BREAST XLRG (GAUZE/BANDAGES/DRESSINGS) IMPLANT
BIOPATCH RED 1 DISK 7.0 (GAUZE/BANDAGES/DRESSINGS) ×2 IMPLANT
BIOPATCH RED 1IN DISK 7.0MM (GAUZE/BANDAGES/DRESSINGS) ×1
CANISTER SUCT 3000ML PPV (MISCELLANEOUS) ×3 IMPLANT
CHLORAPREP W/TINT 26 (MISCELLANEOUS) ×3 IMPLANT
CLIP APPLIE 9.375 MED OPEN (MISCELLANEOUS) ×1 IMPLANT
COVER SURGICAL LIGHT HANDLE (MISCELLANEOUS) ×3 IMPLANT
COVER WAND RF STERILE (DRAPES) ×3 IMPLANT
DERMABOND ADVANCED (GAUZE/BANDAGES/DRESSINGS) ×2
DERMABOND ADVANCED .7 DNX12 (GAUZE/BANDAGES/DRESSINGS) ×1 IMPLANT
DRAIN CHANNEL 19F RND (DRAIN) ×3 IMPLANT
DRAPE CHEST BREAST 15X10 FENES (DRAPES) ×3 IMPLANT
DRAPE HALF SHEET 40X57 (DRAPES) ×3 IMPLANT
DRSG TEGADERM 4X4.75 (GAUZE/BANDAGES/DRESSINGS) ×3 IMPLANT
ELECT BLADE 4.0 EZ CLEAN MEGAD (MISCELLANEOUS) ×3
ELECT CAUTERY BLADE 6.4 (BLADE) ×3 IMPLANT
ELECT REM PT RETURN 9FT ADLT (ELECTROSURGICAL) ×3
ELECTRODE BLDE 4.0 EZ CLN MEGD (MISCELLANEOUS) ×1 IMPLANT
ELECTRODE REM PT RTRN 9FT ADLT (ELECTROSURGICAL) ×1 IMPLANT
EVACUATOR SILICONE 100CC (DRAIN) ×3 IMPLANT
GLOVE BIO SURGEON STRL SZ7.5 (GLOVE) ×2 IMPLANT
GLOVE BIOGEL PI IND STRL 7.5 (GLOVE) IMPLANT
GLOVE BIOGEL PI INDICATOR 7.5 (GLOVE) ×2
GLOVE SS BIOGEL STRL SZ 7 (GLOVE) ×1 IMPLANT
GLOVE SUPERSENSE BIOGEL SZ 7 (GLOVE) ×2
GOWN STRL REUS W/ TWL LRG LVL3 (GOWN DISPOSABLE) ×1 IMPLANT
GOWN STRL REUS W/ TWL XL LVL3 (GOWN DISPOSABLE) ×1 IMPLANT
GOWN STRL REUS W/TWL LRG LVL3 (GOWN DISPOSABLE) ×3
GOWN STRL REUS W/TWL XL LVL3 (GOWN DISPOSABLE) ×3
KIT BASIN OR (CUSTOM PROCEDURE TRAY) ×3 IMPLANT
KIT TURNOVER KIT B (KITS) ×3 IMPLANT
NS IRRIG 1000ML POUR BTL (IV SOLUTION) ×3 IMPLANT
PACK GENERAL/GYN (CUSTOM PROCEDURE TRAY) ×3 IMPLANT
PAD ARMBOARD 7.5X6 YLW CONV (MISCELLANEOUS) ×3 IMPLANT
PENCIL SMOKE EVACUATOR COATED (MISCELLANEOUS) ×2 IMPLANT
SPECIMEN JAR X LARGE (MISCELLANEOUS) ×3 IMPLANT
SUT ETHILON 3 0 FSL (SUTURE) ×5 IMPLANT
SUT MNCRL AB 4-0 PS2 18 (SUTURE) ×3 IMPLANT
SUT SILK 2 0 PERMA HAND 18 BK (SUTURE) ×3 IMPLANT
SUT VIC AB 3-0 SH 18 (SUTURE) ×3 IMPLANT
TOWEL GREEN STERILE (TOWEL DISPOSABLE) ×3 IMPLANT
TOWEL GREEN STERILE FF (TOWEL DISPOSABLE) ×3 IMPLANT

## 2019-05-04 NOTE — Anesthesia Procedure Notes (Signed)
Anesthesia Regional Block: Pectoralis block   Pre-Anesthetic Checklist: ,, timeout performed, Correct Patient, Correct Site, Correct Laterality, Correct Procedure, Correct Position, site marked, Risks and benefits discussed,  Surgical consent,  Pre-op evaluation,  At surgeon's request and post-op pain management  Laterality: Left  Prep: chloraprep       Needles:  Injection technique: Single-shot  Needle Type: Echogenic Needle     Needle Length: 9cm  Needle Gauge: 21     Additional Needles:   Narrative:  Start time: 05/04/2019 2:00 PM End time: 05/04/2019 2:10 PM Injection made incrementally with aspirations every 5 mL.  Performed by: Personally  Anesthesiologist: Roberts Gaudy, MD  Additional Notes: 35 cc 0.25% Bupivacaine with 1:200 epi injected easily

## 2019-05-04 NOTE — Transfer of Care (Signed)
Immediate Anesthesia Transfer of Care Note  Patient: ALESIA OSHIELDS  Procedure(s) Performed: LEFT TOTAL MASTECTOMY (Left Breast)  Patient Location: PACU  Anesthesia Type:GA combined with regional for post-op pain  Level of Consciousness: awake, alert  and patient cooperative  Airway & Oxygen Therapy: Patient Spontanous Breathing and Patient connected to nasal cannula oxygen  Post-op Assessment: Report given to RN and Post -op Vital signs reviewed and stable  Post vital signs: Reviewed and stable  Last Vitals:  Vitals Value Taken Time  BP 94/59 05/04/19 1541  Temp    Pulse 45 05/04/19 1541  Resp 20 05/04/19 1541  SpO2 90 % 05/04/19 1541  Vitals shown include unvalidated device data.  Last Pain:  Vitals:   05/04/19 1415  TempSrc:   PainSc: 0-No pain      Patients Stated Pain Goal: 3 (97/[id number]/50 [id number])  Complications: No apparent anesthesia complications

## 2019-05-04 NOTE — Op Note (Signed)
Patient Name:           [name]   Date of Surgery:        05/04/2019  Pre op Diagnosis:      Cancer left breast  Post op Diagnosis:    Same  Procedure:                 Left simple mastectomy  Surgeon:                     Edsel Petrin. Dalbert Batman, M.D., FACS  Assistant:                      RNFA   Indication for Assistant: Expedite procedure in patient with multiple medical problems.  Expedite hemorrhage control.  Operative Indications:   This is a very pleasant 84 year old female who was seen at the [facility] today because of a new diagnosis of left breast cancer. she was referred by Emmit Pomfret at [facility] mammography. Dr. Burr Medico and Dr. Lisbeth Renshaw and I saw the patient. [name] is her PCP. Edwinna Areola is her gynecologist. Dr. Elsworth Soho is her pulmonologist. Dr. Stanford Breed is her cardiologist. the nursing technician from the [facility] served as my chaperone.      Significant past history of right breast cancer and right total mastectomy and sentinel node biopsy in 2004 by Dr. Margot Chimes. 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 There's been no local recurrence. She recently felt a lump in the left breast, outer, slightly upper. Imaging studies showed indistinct deformity in the upper outer quadrant of the left breast, 1.6 cm in diameter. The left axilla was negative by ultrasound. Exam revealed that there was a larger harder mass, 3-4 cm in size.  This more or less feels the lateral part of the breast but does not appear to be invading the skin and is not fixed to the chest wall. Image guided biopsy of the left breast mass shows invasive lobular carcinoma, grade 2. HER-2/neu negative. ER 100%. PR 30%. Proliferation index 20%.      Past history is significant for oxygen-dependent COPD. 2 L a day. She is otherwise independent and drives her own car. She is followed by Dr. Elsworth Soho. She had intracranial hemorrhage, right occipital area, 2016, head  craniotomy. She recovered from that. She has atrial fibrillation but she is not a candidate for anticoagulation because of intracranial hemorrhage. 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 Hypertension. CHF. History right total mastectomy for extensive DCIS, 2004. No local recurrence     Family history significant for breast cancer in her daughter in her 2s. Treated in [place]. Genetic testing negative. Maternal aunt breast cancer in her 41s. Maternal grandmother had colon cancer. Mother had colon cancer.      We talked for a long time about management of her left breast cancer, especially in light of her prior right mastectomy and comorbidities. She is motivated to have surgery to remove the cancer from her body and achieve definitive surgery. We do not think that she would benefit from any type of neoadjuvant therapy to downstage the tumor.  Most likely we can remove the cancer but there is somewhat of a risk of positive margins . Surgical goal would be to do a left simple mastectomy, followed by antiestrogen therapy as an adjunct.. . I would need to discuss the best anesthetic option with anesthesiat.  She has been referred to Dr. Elsworth Soho and [facility] further risk assessment and clearance for surgery.  She is obvious at intermediate to high risk but acceptable for general anesthesia.   Operative Findings:       The tumor felt 5 cm in size.  The breast is small.  The cancer seems closer to the inferior skin flaps than the superior.  Cancer did not appear to be invading the chest wall.  If our margins are close anywhere it will be the inferior flap.  Procedure in Detail:          Following the induction of general LMA anesthesia the patient's left chest wall was prepped and draped in a sterile fashion.  Surgical timeout was performed.  Intravenous antibiotics were given.      I designed a transverse elliptical incision and took as much skin as  I could.  Closure was still a little tight but the wound was closed primarily.     The incision was made with a knife.  Skin flaps were raised medially to the parasternal area, superiorly to the infraclavicular area, laterally to the latissimus dorsi muscle and inferiorly to the inframammary crease.  The breast was dissected off of the pectoralis major and minor muscles.  The lateral skin margin was marked with silk suture.  The specimen was amputated and sent to the lab.  Hemostasis was excellent and achieved electrocautery.  The wound was irrigated.  A single 80 [place] Blake drain was placed across the skin flaps and then down toward the axillary area.  This was brought out through a separate skin incision inferiorly and sutured to the skin and connected to suction bulbs.    As mentioned above, the closure was a little bit tight centrally because of my efforts to stay away from the cancer.  I placed 3 or 4 interrupted skin sutures of 3-0 nylon to take tension off of the closure.  We then closed the subcutaneous tissue with 3-0 Vicryl sutures and the skin was closed with a running subcuticular 4-0 Monocryl and Dermabond.  Dry bandages and a breast binder were placed.  The patient tolerated the procedure well and was taken to PACU in stable condition.  EBL 75 cc.  Counts correct.  Complications none.    Addendum: I logged onto the PMP aware website and reviewed her prescription medication history     Oniya Mandarino M. Dalbert Batman, M.D., FACS General and Minimally Invasive Surgery Breast and Colorectal Surgery  05/04/2019 3:22 PM

## 2019-05-04 NOTE — Anesthesia Postprocedure Evaluation (Addendum)
Anesthesia Post Note  Patient: [name]  Procedure(s) Performed: LEFT TOTAL MASTECTOMY (Left Breast)     Patient location during evaluation: PACU Anesthesia Type: General Level of consciousness: awake and alert Pain management: pain level controlled Vital Signs Assessment: post-procedure vital signs reviewed and stable Respiratory status: spontaneous breathing, nonlabored ventilation, respiratory function stable and patient connected to nasal cannula oxygen Cardiovascular status: blood pressure returned to baseline and stable Postop Assessment: no apparent nausea or vomiting Anesthetic complications: no    Last Vitals:  Vitals:   05/04/19 2038 05/04/19 2044  BP:    Pulse:    Resp:    Temp:    SpO2: 96% 96%    Last Pain:  Vitals:   05/04/19 1814  TempSrc:   PainSc: 0-No pain                 Emmilee Reamer COKER

## 2019-05-04 NOTE — Anesthesia Procedure Notes (Signed)
Procedure Name: LMA Insertion Date/Time: 05/04/2019 2:28 PM Performed by: Elayne Snare, CRNA Pre-anesthesia Checklist: Patient identified, Emergency Drugs available, Suction available and Patient being monitored Patient Re-evaluated:Patient Re-evaluated prior to induction Oxygen Delivery Method: Circle System Utilized Preoxygenation: Pre-oxygenation with 100% oxygen Induction Type: IV induction Ventilation: Mask ventilation without difficulty LMA: LMA inserted LMA Size: 3.0 Number of attempts: 1 Placement Confirmation: positive ETCO2 Tube secured with: Tape Dental Injury: Teeth and Oropharynx as per pre-operative assessment

## 2019-05-04 NOTE — Interval H&P Note (Signed)
History and Physical Interval Note:  05/04/2019 1:03 PM  [name]  has presented today for surgery, with the diagnosis of BREAST CANCER.  The various methods of treatment have been discussed with the patient and family. After consideration of risks, benefits and other options for treatment, the patient has consented to  Procedure(s): LEFT TOTAL MASTECTOMY (Left) as a surgical intervention.  The patient's history has been reviewed, patient examined, no change in status, stable for surgery.  I have reviewed the patient's chart and labs.  Questions were answered to the patient's satisfaction.     Adin Hector

## 2019-05-05 ENCOUNTER — Encounter (HOSPITAL_COMMUNITY): Payer: Self-pay | Admitting: General Surgery

## 2019-05-05 DIAGNOSIS — C50412 Malignant neoplasm of upper-outer quadrant of left female breast: Secondary | ICD-10-CM | POA: Diagnosis not present

## 2019-05-05 DIAGNOSIS — L821 Other seborrheic keratosis: Secondary | ICD-10-CM | POA: Diagnosis not present

## 2019-05-05 DIAGNOSIS — I482 Chronic atrial fibrillation, unspecified: Secondary | ICD-10-CM | POA: Diagnosis not present

## 2019-05-05 DIAGNOSIS — Z9011 Acquired absence of right breast and nipple: Secondary | ICD-10-CM | POA: Diagnosis not present

## 2019-05-05 DIAGNOSIS — I110 Hypertensive heart disease with heart failure: Secondary | ICD-10-CM | POA: Diagnosis not present

## 2019-05-05 DIAGNOSIS — I5042 Chronic combined systolic (congestive) and diastolic (congestive) heart failure: Secondary | ICD-10-CM | POA: Diagnosis not present

## 2019-05-05 LAB — BASIC METABOLIC PANEL
Anion gap: 7 (ref 5–15)
BUN: 12 mg/dL (ref 8–23)
CO2: 23 mmol/L (ref 22–32)
Calcium: 8.7 mg/dL — ABNORMAL LOW (ref 8.9–10.3)
Chloride: 100 mmol/L (ref 98–111)
Creatinine, Ser: 0.69 mg/dL (ref 0.44–1.00)
GFR calc Af Amer: >60 mL/min (ref >60)
GFR calc non Af Amer: >60 mL/min (ref >60)
Glucose, Bld: 162 mg/dL — ABNORMAL HIGH (ref 70–99)
Potassium: 4.2 mmol/L (ref 3.5–5.1)
Sodium: 130 mmol/L — ABNORMAL LOW (ref 135–145)

## 2019-05-05 LAB — CBC
HCT: 40.3 % (ref 36.0–46.0)
Hemoglobin: 13 g/dL (ref 12.0–15.0)
MCH: 33.1 pg (ref 26.0–34.0)
MCHC: 32.3 g/dL (ref 30.0–36.0)
MCV: 102.5 fL — ABNORMAL HIGH (ref 80.0–100.0)
Platelets: 190 10*3/uL (ref 150–400)
RBC: 3.93 MIL/uL (ref 3.87–5.11)
RDW: 14.3 % (ref 11.5–15.5)
WBC: 6.3 10*3/uL (ref 4.0–10.5)
nRBC: 0 % (ref 0.0–0.2)

## 2019-05-05 MED ORDER — TRAMADOL HCL 50 MG PO TABS: 50.0000 mg | ORAL_TABLET | Freq: Four times a day (QID) | ORAL | 0 refills | Status: DC | PRN

## 2019-05-05 NOTE — Discharge Summary (Signed)
Patient ID: [name] [id number] 83 y.o. 21-Mar-1935  Admit date: 05/04/2019  Discharge date and time: 05/05/2019  Admitting Physician: Adin Hector  Discharge Physician: Adin Hector  Admission Diagnoses: BREAST CANCER  Discharge Diagnoses: Cancer left breast                                         History right mastectomy for cancer                                          COPD, moderately severe, oxygen dependent                                         Atrial fibrillation, chronic                                         History of intracranial hemorrhage                                         Recurrent bladder papillary carcinoma                                         CHF, chronic                                         Hypertension, essential                                         Family history of breast cancer                                           Operations: Procedure(s): LEFT TOTAL MASTECTOMY  Admission Condition: fair  Discharged Condition: fair  Indication for Admission: This is a very pleasant 84 year old female who was seen at the [facility] today because of a new diagnosis of left breast cancer. she was referred by Emmit Pomfret at [facility] mammography. Dr. Burr Medico and Dr. Lisbeth Renshaw and I saw the patient. [name] is her PCP. [name] is her gynecologist. Dr. Elsworth Soho is her pulmonologist. Dr. Stanford Breed is her cardiologist. the nursing technician from the [facility] served as my chaperone. Significant past history of right breast cancer and right total mastectomy and sentinel node biopsy in 2004 by Dr. Margot Chimes. 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 There's been no local recurrence. She recently felt a lump in the left breast, outer, slightly upper. Imaging studies showed indistinct deformity in the upper outer quadrant of the left breast, 1.6 cm in diameter. The left axilla was negative by ultrasound.  Exam revealed that there was a larger harder mass, 3-4 cm in size.  This more or less feels the lateral part of the breast but does not appear to be invading the skin and is not fixed to the chest wall. Image guided biopsy of the left breast mass shows invasive lobular carcinoma, grade 2. HER-2/neu negative. ER 100%. PR 30%. Proliferation index 20%. Past history is significant for oxygen-dependent COPD. 2 L a day. She is otherwise independent and drives her own car. She is followed by Dr. Elsworth Soho. She had intracranial hemorrhage, right occipital area, 2016, head craniotomy. She recovered from that. She has atrial fibrillation but she is not a candidate for anticoagulation because of intracranial hemorrhage. 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 Hypertension. CHF. History right total mastectomy for extensive DCIS, 2004. No local recurrence Family history significant for breast cancer in her daughter in her 56s. Treated in [place]. Genetic testing negative. Maternal aunt breast cancer in her 61s. Maternal grandmother had colon cancer. Mother had colon cancer. We talked for a long time about management of her left breast cancer, especially in light of her prior right mastectomy and comorbidities. She is motivated to have surgery to remove the cancer from her body and achieve definitive surgery. We do not think that she would benefit from any type of neoadjuvant therapy to downstage the tumor.  Most likely we can remove the cancer but there is somewhat of a risk of positive margins . Surgical goal would be to do a left simple mastectomy, followed by antiestrogen therapy as an adjunct.. . I would need to discuss the best anesthetic option with anesthesiat. She has been referred to Dr. Elsworth Soho and [facility] further risk assessment and clearance for surgery.  She is obvious at intermediate to high risk but acceptable for general  anesthesia.  Hospital Course: On the day of admission the patient was taken to the operating room and underwent a left total mastectomy.  Surgery was uneventful.  She had a large tumor in the lateral aspect of the left breast but it was not invading the chest wall.  Primary closure was achieved with just a little bit of tension.      Overnight she did very well.  She had no deterioration of her pulmonary status.  She had no pain.  She was tolerating a diet without nausea.  She was able to void uneventfully.  She wanted to go home       Examination the morning of discharge revealed that her lungs were fairly clear.  O2 saturation was in the low 90s which is probably as good or better than baseline.  Mastectomy wound looks good.  Skin flaps viable.  No hematoma.  Low volume drainage.      Lab work on postop day 1 revealed hemoglobin 13.0 stable.  Sodium 130.  Potassium 4.2.  Glucose 162.  Creatinine 0.69.      She was given instructions in diet, activities, drain care, shoulder range of motion exercises       She was told to continue all of her usual medications.  She was given instructions and management of pain with high-dose Tylenol or high-dose ibuprofen.  I also called in a prescription for tramadol to the pharmacy in case she needs it but she says she may not need that.  I will see her back in the office in 2 weeks.  Because of her multiple comorbidities I encouraged her to make  an appointment with her PCP within about a week to make sure everything was stable  Consults: None  Significant Diagnostic Studies: Surgical pathology, pending  Treatments: surgery: Left total mastectomy  Disposition: Home  Patient Instructions:  Allergies as of 05/05/2019      Reactions   Heparin    Previous history of hemorrhagic stroke.   Morphine And Related Nausea And Vomiting      Medication List    TAKE these medications   albuterol 108 (90 Base) MCG/ACT inhaler Commonly known as: VENTOLIN HFA Inhale 2  puffs into the lungs every 6 (six) hours as needed for wheezing or shortness of breath.   arformoterol 15 MCG/2ML Nebu Commonly known as: BROVANA USE 1 VIAL  IN  NEBULIZER TWICE  DAILY - (Morning and Evening) What changed: See the new instructions.   atorvastatin 10 MG tablet Commonly known as: LIPITOR TAKE ONE TABLET DAILY AT 6PM What changed: See the new instructions.   budesonide 0.5 MG/2ML nebulizer solution Commonly known as: PULMICORT 2ML BY NEBULIZER TWICE DAILY What changed: See the new instructions.   digoxin 0.125 MG tablet Commonly known as: LANOXIN TAKE ONE TABLET DAILY   diltiazem 240 MG 24 hr capsule Commonly known as: CARDIZEM CD TAKE ONE CAPSULE EACH DAY What changed: See the new instructions.   ipratropium 0.02 % nebulizer solution Commonly known as: ATROVENT USE 2.80m BY NEBULIZER TWICE DAILY What changed: See the new instructions.   latanoprost 0.005 % ophthalmic solution Commonly known as: XALATAN Place 1 drop into both eyes at bedtime. Reported on 11/09/2015   OXYGEN Inhale 2 L into the lungs continuous.   pantoprazole 40 MG tablet Commonly known as: PROTONIX TAKE ONE TABLET DAILY   Simbrinza 1-0.2 % Susp Generic drug: Brinzolamide-Brimonidine Place 1 drop into both eyes 2 (two) times daily.   traMADol 50 MG tablet Commonly known as: ULTRAM Take 1 tablet (50 mg total) by mouth every 6 (six) hours as needed (mild pain).   valACYclovir 1000 MG tablet Commonly known as: VALTREX Take 1,000 mg by mouth daily.            Discharge Care Instructions  (From admission, onward)         Start     Ordered   05/05/19 0000  Discharge wound care:    Comments: Keep the wound clean and dry for the most part. Keep a written record of the drainage and bring the written record to the office with you at each office visit The nursing staff will teach you how to manage the drain and record the output  Move your right shoulder around several times a  day to avoid stiffness  You may take a quick shower if you wish. No tub baths   05/05/19 0602          Activity: I encouraged her to be out of bed to chair, ambulate as much as possible.  Continue incentive spirometry.  Shoulder range of motion exercises outlined. Diet: low fat, low cholesterol diet Wound Care: as directed  Follow-up:  With Dr. IDalbert Batmanin 2 weeks    Addendum: I logged onto the PMP aware website and reviewed her prescription medication history.  Signed: HEdsel Petrin IDalbert Batman M.D., FACS General and minimally invasive surgery Breast and Colorectal Surgery  05/05/2019, 6:03 AM

## 2019-05-05 NOTE — Addendum Note (Signed)
Addendum  created 05/05/19 1034 by Roberts Gaudy, MD   Clinical Note Signed

## 2019-05-05 NOTE — Progress Notes (Signed)
Pt discharged home in stable condition

## 2019-05-06 ENCOUNTER — Telehealth: Payer: Self-pay | Admitting: *Deleted

## 2019-05-06 NOTE — Telephone Encounter (Signed)
Transition Care Management Follow-up Telephone Call  How have you been since you were released from the hospital? I am doing pretty well.    Do you understand why you were in the hospital? Yes, I had my left breast removed due to cancer.   Do you understand the discharge instrcutions? yes  Items Reviewed:  Medications reviewed: yes  Allergies reviewed: yes  Dietary changes reviewed: yes  Referrals reviewed: yes   Functional Questionnaire:   Activities of Daily Living (ADLs):   She states they are independent in the following: ambulation, bathing and hygiene, feeding, continence, grooming, toileting and dressing States they require assistance with the following: none   Any transportation issues/concerns?: no   Any patient concerns? Yes, I have a little red blood on my dressing and I am planning to call Dr. Dalbert Batman if it gets any worse.    Confirmed importance and date/time of follow-up visits scheduled: yes, 05/13/19 @ 2:00 with PCP. Dr. Dalbert Batman wanted a PCP f/u because of patient's multiple comorbidities.    Confirmed with patient if condition begins to worsen call PCP or go to the ER.  Patient was given the Call-a-Nurse line [phone]: no

## 2019-05-07 ENCOUNTER — Encounter: Payer: Self-pay | Admitting: *Deleted

## 2019-05-12 ENCOUNTER — Telehealth: Payer: Self-pay | Admitting: Hematology

## 2019-05-12 NOTE — Telephone Encounter (Signed)
Returned patient's phone call regarding rescheduling 09/11 appointment, per patient's request appointment has been moved to 09/23.

## 2019-05-13 ENCOUNTER — Ambulatory Visit (INDEPENDENT_AMBULATORY_CARE_PROVIDER_SITE_OTHER): Payer: Medicare Other | Admitting: Internal Medicine

## 2019-05-13 ENCOUNTER — Encounter: Payer: Self-pay | Admitting: Internal Medicine

## 2019-05-13 ENCOUNTER — Other Ambulatory Visit: Payer: Self-pay

## 2019-05-13 VITALS — BP 100/60 | HR 70 | Temp 97.5°F | Resp 20 | Ht 66.0 in | Wt 149.0 lb

## 2019-05-13 DIAGNOSIS — I4891 Unspecified atrial fibrillation: Secondary | ICD-10-CM | POA: Diagnosis not present

## 2019-05-13 DIAGNOSIS — J418 Mixed simple and mucopurulent chronic bronchitis: Secondary | ICD-10-CM | POA: Diagnosis not present

## 2019-05-13 DIAGNOSIS — R7303 Prediabetes: Secondary | ICD-10-CM | POA: Diagnosis not present

## 2019-05-13 DIAGNOSIS — I5042 Chronic combined systolic (congestive) and diastolic (congestive) heart failure: Secondary | ICD-10-CM | POA: Diagnosis not present

## 2019-05-13 LAB — POCT GLYCOSYLATED HEMOGLOBIN (HGB A1C): Hemoglobin A1C: 5.7 % — AB (ref 4.0–5.6)

## 2019-05-13 LAB — POCT GLUCOSE (DEVICE FOR HOME USE): Glucose Fasting, POC: 106 mg/dL — AB (ref 70–99)

## 2019-05-13 NOTE — Patient Instructions (Signed)

## 2019-05-13 NOTE — Progress Notes (Signed)
Subjective:  Patient ID: [name], female    DOB: 1935-03-12  Age: 84 y.o. MRN: [id number]  CC: Atrial Fibrillation and COPD   HPI [name] presents for f/up - She is two-week status post mastectomy for the treatment of breast cancer.  She tells me she will not be doing any chemotherapy or radiation.  She is at her baseline with persistent shortness of breath.  She denies any recent episodes of chest pain, palpitations, diaphoresis, or cough.  She was found to be hyperglycemic preoperatively.  Outpatient Medications Prior to Visit  Medication Sig Dispense Refill  . albuterol (PROVENTIL HFA;VENTOLIN HFA) 108 (90 Base) MCG/ACT inhaler Inhale 2 puffs into the lungs every 6 (six) hours as needed for wheezing or shortness of breath. 1 Inhaler 2  . arformoterol (BROVANA) 15 MCG/2ML NEBU USE 1 VIAL  IN  NEBULIZER TWICE  DAILY - (Morning and Evening) (Patient taking differently: Take 15 mcg by nebulization 2 (two) times daily. ) 60 mL 11  . atorvastatin (LIPITOR) 10 MG tablet TAKE ONE TABLET DAILY AT 6PM (Patient taking differently: Take 10 mg by mouth daily at 6 PM. ) 90 tablet 1  . budesonide (PULMICORT) 0.5 MG/2ML nebulizer solution 2ML BY NEBULIZER TWICE DAILY (Patient taking differently: Take 0.5 mg by nebulization 2 (two) times daily. ) 120 mL 5  . digoxin (LANOXIN) 0.125 MG tablet TAKE ONE TABLET DAILY 90 tablet 0  . diltiazem (CARDIZEM CD) 240 MG 24 hr capsule TAKE ONE CAPSULE EACH DAY (Patient taking differently: Take 240 mg by mouth daily. ) 90 capsule 3  . ipratropium (ATROVENT) 0.02 % nebulizer solution USE 2.72m BY NEBULIZER TWICE DAILY (Patient taking differently: Take 0.25 mg by nebulization 2 (two) times daily. ) 150 mL 2  . latanoprost (XALATAN) 0.005 % ophthalmic solution Place 1 drop into both eyes at bedtime. Reported on 11/09/2015    . OXYGEN Inhale 2 L into the lungs continuous.     . pantoprazole (PROTONIX) 40 MG tablet TAKE ONE TABLET DAILY 90 tablet 1  .  SIMBRINZA 1-0.2 % SUSP Place 1 drop into both eyes 2 (two) times daily.   99  . traMADol (ULTRAM) 50 MG tablet Take 1 tablet (50 mg total) by mouth every 6 (six) hours as needed (mild pain). 20 tablet 0  . valACYclovir (VALTREX) 1000 MG tablet Take 1,000 mg by mouth daily.      No facility-administered medications prior to visit.     ROS Review of Systems  Constitutional: Negative.  Negative for diaphoresis and fatigue.  HENT: Negative.   Eyes: Negative for visual disturbance.  Respiratory: Positive for shortness of breath. Negative for cough and wheezing.   Cardiovascular: Negative for chest pain, palpitations and leg swelling.  Gastrointestinal: Negative for abdominal pain, constipation, diarrhea, nausea and vomiting.  Endocrine: Negative.   Genitourinary: Negative.  Negative for difficulty urinating.  Musculoskeletal: Negative for arthralgias and myalgias.  Skin: Negative.  Negative for color change and pallor.  Neurological: Negative.  Negative for dizziness, weakness, light-headedness and headaches.  Hematological: Negative for adenopathy. Does not bruise/bleed easily.  Psychiatric/Behavioral: Negative.     Objective:  BP 100/60 (BP Location: Right Arm, Patient Position: Sitting, Cuff Size: Normal)   Pulse 70   Temp (!) 97.5 F (36.4 C) (Oral)   Resp 20   Ht _0  (1.676 m)   Wt 149 lb (67.6 kg)   LMP 12/03/2002   SpO2 93%   BMI 24.05 kg/m   BP Readings  from Last 3 Encounters:  05/13/19 100/60  05/05/19 110/66  04/30/19 116/86    Wt Readings from Last 3 Encounters:  05/13/19 149 lb (67.6 kg)  04/30/19 149 lb 14.4 oz (68 kg)  04/08/19 149 lb 9.6 oz (67.9 kg)    Physical Exam Vitals signs reviewed.  Constitutional:      General: She is not in acute distress.    Appearance: She is ill-appearing. She is not toxic-appearing.  HENT:     Nose: Nose normal.     Mouth/Throat:     Pharynx: No oropharyngeal exudate.  Eyes:     General: No scleral icterus.     Conjunctiva/sclera: Conjunctivae normal.  Neck:     Musculoskeletal: Normal range of motion and neck supple. No neck rigidity.  Cardiovascular:     Rate and Rhythm: Normal rate. Rhythm irregularly irregular.     Heart sounds: No systolic murmur. No diastolic murmur. No gallop.   Pulmonary:     Effort: Tachypnea and accessory muscle usage present. No respiratory distress.     Breath sounds: No decreased breath sounds, wheezing, rhonchi or rales.  Abdominal:     General: Abdomen is flat.     Palpations: There is no hepatomegaly, splenomegaly or mass.     Tenderness: There is no abdominal tenderness.  Musculoskeletal: Normal range of motion.     Right lower leg: No edema.     Left lower leg: No edema.  Lymphadenopathy:     Cervical: No cervical adenopathy.  Skin:    General: Skin is warm and dry.     Coloration: Skin is not pale.  Neurological:     General: No focal deficit present.     Mental Status: She is alert.     Lab Results  Component Value Date   WBC 6.3 05/05/2019   HGB 13.0 05/05/2019   HCT 40.3 05/05/2019   PLT 190 05/05/2019   GLUCOSE 162 (H) 05/05/2019   CHOL 121 04/19/2017   TRIG 114 04/19/2017   HDL 46 04/19/2017   LDLCALC 52 04/19/2017   ALT 31 04/08/2019   AST 26 04/08/2019   NA 130 (L) 05/05/2019   K 4.2 05/05/2019   CL 100 05/05/2019   CREATININE 0.69 05/05/2019   BUN 12 05/05/2019   CO2 23 05/05/2019   TSH 0.632 12/26/2017   INR 1.06 02/05/2015   HGBA1C 5.7 (A) 05/13/2019    No results found.  Assessment & Plan:   [name] was seen today for atrial fibrillation and copd.  Diagnoses and all orders for this visit:  Prediabetes- She has mild prediabetes.  Medical therapy is not indicated. -     POCT glycosylated hemoglobin (Hb A1C) -     POCT Glucose (Device for Home Use)  Atrial fibrillation with RVR ([name])- She has good rate control but remains in atrial fibrillation.  Anticoagulation is contraindicated.  Mixed simple and mucopurulent  chronic bronchitis ([name])- She is doing well on the combination of nebulized LABA/ICS.  Chronic combined systolic and diastolic congestive heart failure ([name])- She has a normal volume status today.   I am having [name] maintain her latanoprost, Simbrinza, albuterol, budesonide, diltiazem, arformoterol, pantoprazole, OXYGEN, ipratropium, atorvastatin, valACYclovir, digoxin, and traMADol.  No orders of the defined types were placed in this encounter.    Follow-up: No follow-ups on file.  [name], MD

## 2019-05-15 ENCOUNTER — Ambulatory Visit: Payer: Medicare Other | Admitting: Hematology

## 2019-05-20 ENCOUNTER — Other Ambulatory Visit: Payer: Self-pay | Admitting: Internal Medicine

## 2019-05-22 NOTE — Progress Notes (Signed)
[name]   Telephone:([phone]) [phone] Fax:(336) [phone]   Clinic Follow up Note   Patient Care Team: Janith Lima, MD as PCP - General (Internal Medicine) Stanford Breed, Denice Bors, MD as PCP - Cardiology (Cardiology) Rockwell Germany, RN as Oncology Nurse Navigator Mauro Kaufmann, RN as Oncology Nurse Navigator Truitt Merle, MD as Consulting Physician (Hematology) Fanny Skates, MD as Consulting Physician (General Surgery) Kyung Rudd, MD as Consulting Physician (Radiation Oncology) Alexis Frock, MD as Consulting Physician (Anesthesiology) Alexis Frock, MD as Consulting Physician (Urology) [name], MD as Consulting Physician (Cardiology) Rigoberto Noel, MD as Consulting Physician (Pulmonary Disease)  Date of Service:  05/27/2019  CHIEF COMPLAINT: f/u of left breast cancer  SUMMARY OF ONCOLOGIC HISTORY: Oncology History Overview Note  Cancer Staging Malignant neoplasm of upper-outer quadrant of left breast in female, estrogen receptor positive ([place]) Staging form: Breast, AJCC 8th Edition - Clinical stage from 04/01/2019: Stage IA (cT1c, cN0, cM0, G2, ER+, PR+, HER2-) - Signed by Truitt Merle, MD on 04/07/2019 - Pathologic stage from 05/04/2019: Stage Unknown (pT2, pNX, cM0, G2, ER+, PR+, HER2-) - Signed by Truitt Merle, MD on 05/27/2019    Malignant neoplasm of upper-outer quadrant of left breast in female, estrogen receptor positive ([place])  03/30/2019 Mammogram   Left diagnostic mammogram and [place] 03/30/19  IMPRESSION The 1.6cm heterogenous focal area in the left breast at 1:00-2:00 4-6cm form the nipple, corresponding to the hard palpable area in the left breast is suspicious of malignancy, particularly lobular carcinoma.     04/01/2019 Cancer Staging   Staging form: Breast, AJCC 8th Edition - Clinical stage from 04/01/2019: Stage IA (cT1c, cN0, cM0, G2, ER+, PR+, HER2-) - Signed by Truitt Merle, MD on 04/07/2019   04/01/2019 Initial Biopsy   Diagnosis 04/01/19 Breast,  left, needle core biopsy, 2 o'clock, 5cmfn - INVASIVE MAMMARY CARCINOMA, SEE COMMENT. - MAMMARY CARCINOMA IN SITU.   04/01/2019 Receptors her2   The tumor cells are NEGATIVE for Her2 (1+). Estrogen Receptor: 100%, POSITIVE, STRONG STAINING INTENSITY Progesterone Receptor: 30%, POSITIVE, STRONG STAINING INTENSITY Proliferation Marker Ki67: 20%   04/03/2019 Initial Diagnosis   Malignant neoplasm of upper-outer quadrant of left breast in female, estrogen receptor positive (H[facility])    Genetic Testing   Negative genetic testing. No pathogenic variants identified on the [facility] Breast Cancer STAT Panel + Common Hereditary Cancers Panel. The STAT Breast cancer panel offered by [facility] includes sequencing and rearrangement analysis for the following 9 genes:  ATM, BRCA1, BRCA2, CDH1, CHEK2, PALB2, PTEN, STK11 and TP53.  The Common Hereditary Cancers Panel offered by [facility] includes sequencing and/or deletion duplication testing of the following 47 genes: APC, ATM, AXIN2, BARD1, BMPR1A, BRCA1, BRCA2, BRIP1, CDH1, CDKN2A (p14ARF), CDKN2A (p16INK4a), CKD4, CHEK2, CTNNA1, DICER1, EPCAM (Deletion/duplication testing only), GREM1 (promoter region deletion/duplication testing only), KIT, MEN1, MLH1, MSH2, MSH3, MSH6, MUTYH, NBN, NF1, NHTL1, PALB2, PDGFRA, PMS2, POLD1, POLE, PTEN, RAD50, RAD51C, RAD51D,  SDHB, SDHC, SDHD, SMAD4, SMARCA4. STK11, TP53, TSC1, TSC2, and VHL.  The following genes were evaluated for sequence changes only: SDHA and HOXB13 c.251G>A variant only. The report date is 04/17/2019.    05/04/2019 Surgery    LEFT TOTAL MASTECTOMY by Dr. Dalbert Batman 05/04/19   05/04/2019 Pathology Results   Diagnosis 05/04/19 1. Breast, excision - INVASIVE LOBULAR CARCINOMA, NOTTINGHAM GRADE 2 - SEE COMMENT 2. Breast, simple mastectomy, Left total - MULTIFOCAL, INVASIVE LOBULAR CARCINOMA, NOTTINGHAM GRADE 2 OF 3, 2.1 CM - MARGINS UNINVOLVED BY CARCINOMA (0.2 CM; POSTERIOR MARGIN) -  LYMPHOVASCULAR SPACE INVASION  PRESENT - PREVIOUS BIOPSY SITE CHANGES PRESENT - SEBORRHEIC KERATOSIS - SEE ONCOLOGY TABLE AND COMMENT BELOW    Anti-estrogen oral therapy   She declined antiestrogen therapy.    05/04/2019 Cancer Staging   Staging form: Breast, AJCC 8th Edition - Pathologic stage from 05/04/2019: Stage Unknown (pT2, pNX, cM0, G2, ER+, PR+, HER2-) - Signed by Truitt Merle, MD on 05/27/2019      CURRENT THERAPY:  Surveillance  INTERVAL HISTORY:  [name] is here for a follow up after mastectomy. She notes she has recovered well from surgery. She notes she had draining tube removed more than 1 week ago. She notes she has normal left arm ROM. She is no longer on pain meds. She notes she had hormonal replacement when she started menopause. She did not stop until her right breast cancer. She notes when she stopped she started having hot flashes. She notes her husband has parkinson's. He is still working. She notes one of her daughters has breast cancer and stopped her Tamoxifen due to poor tolerance.    REVIEW OF SYSTEMS:   Constitutional: Denies fevers, chills or abnormal weight loss Eyes: Denies blurriness of vision Ears, nose, mouth, throat, and face: Denies mucositis or sore throat Respiratory: Denies cough, dyspnea or wheezes (+) On oxygen canula  Cardiovascular: Denies palpitation, chest discomfort or lower extremity swelling Gastrointestinal:  Denies nausea, heartburn or change in bowel habits Skin: Denies abnormal skin rashes Lymphatics: Denies new lymphadenopathy or easy bruising Neurological:Denies numbness, tingling or new weaknesses Behavioral/Psych: Mood is stable, no new changes  All other systems were reviewed with the patient and are negative.  MEDICAL HISTORY:  Past Medical History:  Diagnosis Date  . Arthritis   . COPD (chronic obstructive pulmonary disease) ([place])   . Dyspnea on exertion   . Family history of breast cancer   . Family history of colon cancer   . Family  history of ovarian cancer   . Family history of prostate cancer   . GERD (gastroesophageal reflux disease)   . Glaucoma of both eyes   . History of atrial fibrillation without current medication 2010   POST SURG 2010--  PER PT NO ISSUES SINCE  . History of breast cancer    DX DCIS IN 2004--  S/P RIGHT MASTECTOMY , NO CHEMORADIATION---  NO RECURRENCE  . History of CHF (congestive heart failure)    POST SURG 2010  . History of hypertension   . History of shingles    02/ 2015--  back of neck and left flank--  no residual pain  . ICH (intracerebral hemorrhage) ([place]) 02/05/2015  . Nephrolithiasis   . Recurrent bladder papillary carcinoma ([place]) first dx 06/ 2014   s/p  turbt's  and instillation mitomycin c (chemo)//dx again in 2015-different type  . Stroke [facility])     SURGICAL HISTORY: Past Surgical History:  Procedure Laterality Date  . CATARACT EXTRACTION W/ INTRAOCULAR LENS  IMPLANT, BILATERAL    . CRANIECTOMY N/A 02/05/2015   Procedure: SUBOCCIPITAL CRANIECTOMY EVACUATION OF HEMATOMA;  Surgeon: Consuella Lose, MD;  Location: [place] [facility];  Service: Neurosurgery;  Laterality: N/A;  . CYSTOSCOPY W/ RETROGRADES Bilateral 08/11/2014   Procedure: CYSTOSCOPY WITH BILATERAL RETROGRADE PYELOGRAM AND MITOMYCIN INSTILLATION;  Surgeon: Alexis Frock, MD;  Location: [facility];  Service: Urology;  Laterality: Bilateral;  . CYSTOSCOPY W/ URETERAL STENT PLACEMENT Bilateral 03/26/2013   Procedure: CYSTOSCOPY WITH BILATERAL RETROGRADE PYELOGRAM/URETERAL STENT PLACEMENT;  Surgeon: Alexis Frock, MD;  Location:  [place];  Service: Urology;  Laterality: Bilateral;  . CYSTOSCOPY W/ URETERAL STENT PLACEMENT Left 05/13/2013   Procedure: CYSTOSCOPY WITH RETROGRADE PYELOGRAM/URETERAL STENT PLACEMENT STENT EXCHANGE;  Surgeon: Alexis Frock, MD;  Location: [facility];  Service: Urology;  Laterality: Left;  . PARTIAL MASTECTOMY WITH NEEDLE LOCALIZATION Right  01-14-2003   DCIS  . REMOVAL CYST LEFT HAND  2013  . TOTAL KNEE ARTHROPLASTY Right 03-22-2009  . TOTAL MASTECTOMY Right 02-03-2003   W/ SLN BX  AND  POST 03-01-2003 EVACUATION HEMATOMA  . TOTAL MASTECTOMY Left 05/04/2019   Procedure: LEFT TOTAL MASTECTOMY;  Surgeon: Fanny Skates, MD;  Location: [place];  Service: General;  Laterality: Left;  . TRANSTHORACIC ECHOCARDIOGRAM  03-25-2009   MILD LVF/ EF 70-80%/ MILD INCREASE SYSTOLIC PULMONARY  PRESSURE  . TRANSURETHRAL RESECTION OF BLADDER TUMOR WITH GYRUS (TURBT-GYRUS) N/A 03/26/2013   Procedure: TRANSURETHRAL RESECTION OF BLADDER TUMOR WITH GYRUS (TURBT-GYRUS);  Surgeon: Alexis Frock, MD;  Location: [facility];  Service: Urology;  Laterality: N/A;  . TRANSURETHRAL RESECTION OF BLADDER TUMOR WITH GYRUS (TURBT-GYRUS) N/A 05/13/2013   Procedure: TRANSURETHRAL RESECTION OF BLADDER TUMOR WITH GYRUS (TURBT-GYRUS)  RE-STAGING TRANSURETHRAL RESECTION OF BLADDER TUMOR, LEFT RETROGRADE PYELOGRAM AND STENT EXCHANGE;  Surgeon: Alexis Frock, MD;  Location: [facility];  Service: Urology;  Laterality: N/A;  . TRANSURETHRAL RESECTION OF BLADDER TUMOR WITH GYRUS (TURBT-GYRUS) N/A 08/11/2014   Procedure: TRANSURETHRAL RESECTION OF BLADDER TUMOR WITH GYRUS (TURBT-GYRUS);  Surgeon: Alexis Frock, MD;  Location: [facility];  Service: Urology;  Laterality: N/A;  . TUBAL LIGATION      I have reviewed the social history and family history with the patient and they are unchanged from previous note.  ALLERGIES:  is allergic to heparin and morphine and related.  MEDICATIONS:  Current Outpatient Medications  Medication Sig Dispense Refill  . albuterol (PROVENTIL HFA;VENTOLIN HFA) 108 (90 Base) MCG/ACT inhaler Inhale 2 puffs into the lungs every 6 (six) hours as needed for wheezing or shortness of breath. 1 Inhaler 2  . arformoterol (BROVANA) 15 MCG/2ML NEBU USE 1 VIAL  IN  NEBULIZER TWICE  DAILY - (Morning and Evening)  (Patient taking differently: Take 15 mcg by nebulization 2 (two) times daily. ) 60 mL 11  . atorvastatin (LIPITOR) 10 MG tablet TAKE ONE TABLET DAILY AT 6PM (Patient taking differently: Take 10 mg by mouth daily at 6 PM. ) 90 tablet 1  . budesonide (PULMICORT) 0.5 MG/2ML nebulizer solution 2ML BY NEBULIZER TWICE DAILY (Patient taking differently: Take 0.5 mg by nebulization 2 (two) times daily. ) 120 mL 5  . digoxin (LANOXIN) 0.125 MG tablet TAKE ONE TABLET DAILY 90 tablet 0  . diltiazem (CARDIZEM CD) 240 MG 24 hr capsule TAKE ONE CAPSULE EACH DAY (Patient taking differently: Take 240 mg by mouth daily. ) 90 capsule 3  . ipratropium (ATROVENT) 0.02 % nebulizer solution USE 2.54m BY NEBULIZER TWICE DAILY (Patient taking differently: Take 0.25 mg by nebulization 2 (two) times daily. ) 150 mL 2  . latanoprost (XALATAN) 0.005 % ophthalmic solution Place 1 drop into both eyes at bedtime. Reported on 11/09/2015    . OXYGEN Inhale 2 L into the lungs continuous.     . pantoprazole (PROTONIX) 40 MG tablet TAKE ONE TABLET DAILY 90 tablet 1  . SIMBRINZA 1-0.2 % SUSP Place 1 drop into both eyes 2 (two) times daily.   99  . valACYclovir (VALTREX) 1000 MG tablet Take 1,000 mg by mouth  daily.     . traMADol (ULTRAM) 50 MG tablet Take 1 tablet (50 mg total) by mouth every 6 (six) hours as needed (mild pain). (Patient not taking: Reported on 05/27/2019) 20 tablet 0   No current facility-administered medications for this visit.     PHYSICAL EXAMINATION: ECOG PERFORMANCE STATUS: 3 - Symptomatic, >50% confined to bed  Vitals:   05/27/19 1407  BP: 116/74  Pulse: 77  Resp: 18  Temp: 98 F (36.7 C)  SpO2: 91%   Filed Weights   05/27/19 1407  Weight: 151 lb 12.8 oz (68.9 kg)    GENERAL:alert, no distress and comfortable SKIN: skin color, texture, turgor are normal, no rashes or significant lesions EYES: normal, Conjunctiva are pink and non-injected, sclera clear  NECK: supple, thyroid normal size,  non-tender, without nodularity LYMPH:  no palpable lymphadenopathy in the cervical, axillary  LUNGS: clear to auscultation and percussion with normal breathing effort (+) On oxygen canula HEART: regular rate & rhythm and no murmurs and no lower extremity edema ABDOMEN:abdomen soft, non-tender and normal bowel sounds Musculoskeletal:no cyanosis of digits and no clubbing  NEURO: alert & oriented x 3 with fluent speech, no focal motor/sensory deficits BREAST: S/p right and left mastectomy: left Surgical incision healing well. No palpable mass, nodules or adenopathy bilaterally. Breast exam benign.   LABORATORY DATA:  I have reviewed the data as listed CBC Latest Ref Rng & Units 05/05/2019 05/04/2019 04/30/2019  WBC 4.0 - 10.5 K/uL 6.3 7.4 9.6  Hemoglobin 12.0 - 15.0 g/dL 13.0 12.1 14.1  Hematocrit 36.0 - 46.0 % 40.3 37.9 43.8  Platelets 150 - 400 K/uL 190 170 230     CMP Latest Ref Rng & Units 05/05/2019 05/04/2019 04/30/2019  Glucose 70 - 99 mg/dL 162(H) - 89  BUN 8 - 23 mg/dL 12 - 17  Creatinine 0.44 - 1.00 mg/dL 0.69 0.68 0.68  Sodium 135 - 145 mmol/L 130(L) - 138  Potassium 3.5 - 5.1 mmol/L 4.2 - 4.1  Chloride 98 - 111 mmol/L 100 - 103  CO2 22 - 32 mmol/L 23 - 25  Calcium 8.9 - 10.3 mg/dL 8.7(L) - 8.7(L)  Total Protein 6.5 - 8.1 g/dL - - -  Total Bilirubin 0.3 - 1.2 mg/dL - - -  Alkaline Phos 38 - 126 U/L - - -  AST 15 - 41 U/L - - -  ALT 0 - 44 U/L - - -      RADIOGRAPHIC STUDIES: I have personally reviewed the radiological images as listed and agreed with the findings in the report. No results found.   ASSESSMENT & PLAN:  [name] is a 84 y.o. female with   1 Malignant neoplasm of upper-outer quadrant of left breast, lobular carcinoma, Stage IA pT2cN0M0, ER/PR+, HER2-, Grade II -She was diagnosed in 03/2019. She underwent left total mastectomy by Dr Dalbert Batman on 05/04/19.  -We discussed her surgical pathology which showed 2 grade II invasive lobular carcinoma masses with  clear margins, tumor was completely resected.   -Oncotype was not recommend due to her advanced age and medical comorbidities -Given early stage disease, post mastectomy radiation is not recommended.  -Given the strong ER and PR positivity, I do recommend adjuvant aromatase inhibitor to reduce her risk of cancer recurrence. The potential benefit and side effects, which includes but not limited to, hot flash, skin and vaginal dryness, metabolic changes ( increased blood glucose, cholesterol, weight, etc.), slightly in increased risk of cardiovascular disease, cataracts, muscular and joint discomfort,  osteopenia and osteoporosis, etc, were discussed with her in great details. -After lengthy discussion she understands her risk of recurrence. Given her age and comorbidities, especially COPD which requires constant supplemental oxygen, she has declined antiestrogen therapy. That is understandable.  Given her early stage disease, I think her risk of recurrence is probably low.  -We also discussed the breast cancer surveillance. She no longer requires mammogram given b/l mastectomy. She will continue self exam, and a routine office visit with lab and exam with [place]. -F/u in 1 year with me and f/u with her other physicians in interim.  -I offered her flu shot today. She would like to wait until mid next month. I encouraged her to have one this year.   2. H/o of right breast DCIS  -diagnosed in 2004  -s/p right lumpectomy by Dr. Margot Chimes on 01/14/2003 then right mastectomy on 02/03/2003, no chemo or RT or AI  3. H/o of Recurrent bladder papillary carcinoma -Initially diagnosed on 03/26/13 with high grade papillary urothelial carcinoma -Diagnosed recurrent low grade papillary urothelial carcinoma on 08/11/14 -underwent transurethral resection of bladder with Gyrus and stent placement.  -She continues to be followed by her Urologist Dr. Tresa Moore   4. Genetic testing was negative for pathogenetic mutations  5. H/o  of ICH and stroke, S/p craniotomy in 02/2015  6. HTN, Afib -Managed by cardiologist Dr. Stanford Breed -on Lipitor, Cardizem, Protonix   7. Shingles and GERD and arthritis   -On Valtrex once daily and Protonix  -She stays up to date with shingles  -Managed by her PCP   8. COPD -She has 40 year h/o smoking and quit 18 years ago -On 2L O2 canula since 2019 for about a year, on it constantly -On albuterol and inhalers  -Overall stable  -Managed by Dr. Elsworth Soho    PLAN:  -Pt declined adjuvant antiestrogen therapy  -Proceed with surveillance.  -lab and f/u in 1 year    No problem-specific Assessment & Plan notes found for this encounter.   No orders of the defined types were placed in this encounter.  All questions were answered. The patient knows to call the clinic with any problems, questions or concerns. No barriers to learning was detected. I spent 20 minutes counseling the patient face to face. The total time spent in the appointment was 25 minutes and more than 50% was on counseling and review of test results     Truitt Merle, MD 05/27/2019   I, Joslyn Devon, am acting as scribe for Truitt Merle, MD.   I have reviewed the above documentation for accuracy and completeness, and I agree with the above.

## 2019-05-26 DIAGNOSIS — B0052 Herpesviral keratitis: Secondary | ICD-10-CM | POA: Diagnosis not present

## 2019-05-27 ENCOUNTER — Encounter: Payer: Self-pay | Admitting: Hematology

## 2019-05-27 ENCOUNTER — Other Ambulatory Visit: Payer: Self-pay

## 2019-05-27 ENCOUNTER — Inpatient Hospital Stay: Payer: Medicare Other | Attending: Hematology | Admitting: Hematology

## 2019-05-27 VITALS — BP 116/74 | HR 77 | Temp 98.0°F | Resp 18 | Ht 66.0 in | Wt 151.8 lb

## 2019-05-27 DIAGNOSIS — J449 Chronic obstructive pulmonary disease, unspecified: Secondary | ICD-10-CM | POA: Insufficient documentation

## 2019-05-27 DIAGNOSIS — I10 Essential (primary) hypertension: Secondary | ICD-10-CM | POA: Diagnosis not present

## 2019-05-27 DIAGNOSIS — Z170 Estrogen receptor positive status [ER+]: Secondary | ICD-10-CM | POA: Diagnosis not present

## 2019-05-27 DIAGNOSIS — Z86000 Personal history of in-situ neoplasm of breast: Secondary | ICD-10-CM | POA: Insufficient documentation

## 2019-05-27 DIAGNOSIS — I4891 Unspecified atrial fibrillation: Secondary | ICD-10-CM | POA: Diagnosis not present

## 2019-05-27 DIAGNOSIS — Z803 Family history of malignant neoplasm of breast: Secondary | ICD-10-CM | POA: Diagnosis not present

## 2019-05-27 DIAGNOSIS — C50412 Malignant neoplasm of upper-outer quadrant of left female breast: Secondary | ICD-10-CM | POA: Diagnosis not present

## 2019-05-27 DIAGNOSIS — Z9012 Acquired absence of left breast and nipple: Secondary | ICD-10-CM | POA: Diagnosis not present

## 2019-05-28 ENCOUNTER — Encounter: Payer: Self-pay | Admitting: *Deleted

## 2019-05-28 ENCOUNTER — Telehealth: Payer: Self-pay | Admitting: Hematology

## 2019-05-28 NOTE — Telephone Encounter (Signed)
Scheduled appt per 9/23 los.  Spoke with patient and she is aware of the appt date and time.

## 2019-05-29 ENCOUNTER — Encounter: Payer: Self-pay | Admitting: *Deleted

## 2019-05-29 ENCOUNTER — Telehealth: Payer: Self-pay | Admitting: Internal Medicine

## 2019-05-29 DIAGNOSIS — J418 Mixed simple and mucopurulent chronic bronchitis: Secondary | ICD-10-CM

## 2019-06-01 MED ORDER — BUDESONIDE 0.5 MG/2ML IN SUSP: 0.5000 mg | Freq: Two times a day (BID) | RESPIRATORY_TRACT | 5 refills | Status: DC

## 2019-06-01 NOTE — Telephone Encounter (Addendum)
Pharm is calling and needs dx code on rx and please resend

## 2019-06-01 NOTE — Telephone Encounter (Signed)
erx sent with dx.

## 2019-06-01 NOTE — Addendum Note (Signed)
Addended by: Karle Barr on: 06/01/2019 04:34 PM   Modules accepted: Orders

## 2019-06-08 ENCOUNTER — Telehealth: Payer: Self-pay | Admitting: Internal Medicine

## 2019-06-08 ENCOUNTER — Telehealth: Payer: Self-pay | Admitting: Pulmonary Disease

## 2019-06-08 DIAGNOSIS — J449 Chronic obstructive pulmonary disease, unspecified: Secondary | ICD-10-CM

## 2019-06-08 NOTE — Telephone Encounter (Signed)
Called and spoke w/ pt. Pt states she needs a new nebulizer compressor. She states the one she has now was given to her by her PCP, Dr. [name]; however, that it takes her an hour to take 3 ampules of her nebulizer medications and that the machine is starting to make a strange, malfunctioning noise. I let her know that since she is established with [facility] for O2, they may be able to provide her with a nebulizer compressor. I also included to her that I will need to get a provider's approval before sending the order, which pt expressed understanding to.   RA, please advise if you would be ok with [place] placing an order to [facility] to provide pt with a nebulizer compressor. Thank you.

## 2019-06-08 NOTE — Telephone Encounter (Signed)
Pt called stating she is needing a new nebulizer. Pt states the one she has now is making a loud noise and is taking an hour for her to get her medicines. Please advise.

## 2019-06-09 ENCOUNTER — Ambulatory Visit (INDEPENDENT_AMBULATORY_CARE_PROVIDER_SITE_OTHER): Payer: Medicare Other

## 2019-06-09 DIAGNOSIS — Z23 Encounter for immunization: Secondary | ICD-10-CM

## 2019-06-09 NOTE — Telephone Encounter (Signed)
Okay to place order for nebulizer

## 2019-06-09 NOTE — Telephone Encounter (Signed)
Pt came in a picked up a replacement.

## 2019-06-09 NOTE — Telephone Encounter (Signed)
Spoke with pt. She is aware of Dr. Bari Mantis response. Order has been placed. Nothing further was needed.

## 2019-06-09 NOTE — Telephone Encounter (Signed)
Nebulizer order entered through [employer]. (this is a test run for the software).

## 2019-06-22 ENCOUNTER — Other Ambulatory Visit: Payer: Self-pay | Admitting: Cardiology

## 2019-06-22 ENCOUNTER — Telehealth: Payer: Self-pay | Admitting: Pulmonary Disease

## 2019-06-22 ENCOUNTER — Other Ambulatory Visit: Payer: Self-pay | Admitting: Pulmonary Disease

## 2019-06-22 MED ORDER — ALBUTEROL SULFATE HFA 108 (90 BASE) MCG/ACT IN AERS: 2.0000 | INHALATION_SPRAY | Freq: Four times a day (QID) | RESPIRATORY_TRACT | 2 refills | Status: DC | PRN

## 2019-06-22 NOTE — Telephone Encounter (Signed)
Spoke with the pt to verify the msg from earlier  She is asking for ventolin to be sent to [place]  Rx sent  Nothing further needed

## 2019-06-22 NOTE — Telephone Encounter (Signed)
Pt returning call CB# [phone]

## 2019-06-22 NOTE — Telephone Encounter (Signed)
lmtcb for pt.  

## 2019-06-25 NOTE — Telephone Encounter (Signed)
Pt called and stated she received inhaler from [facility].

## 2019-06-25 NOTE — Telephone Encounter (Signed)
Called and spoke to pt. Pt states she already received her albuterol hfa from [facility]. Albuterol hfa was sent in on 06/22/2019. Pt denied needing anything further. Will sign off.

## 2019-06-25 NOTE — Telephone Encounter (Signed)
LMTCB x2 for pt 

## 2019-07-10 ENCOUNTER — Ambulatory Visit (INDEPENDENT_AMBULATORY_CARE_PROVIDER_SITE_OTHER): Payer: Medicare Other | Admitting: Pulmonary Disease

## 2019-07-10 ENCOUNTER — Other Ambulatory Visit: Payer: Self-pay

## 2019-07-10 ENCOUNTER — Telehealth: Payer: Self-pay | Admitting: Pulmonary Disease

## 2019-07-10 ENCOUNTER — Encounter: Payer: Self-pay | Admitting: Pulmonary Disease

## 2019-07-10 DIAGNOSIS — J9611 Chronic respiratory failure with hypoxia: Secondary | ICD-10-CM

## 2019-07-10 DIAGNOSIS — J449 Chronic obstructive pulmonary disease, unspecified: Secondary | ICD-10-CM

## 2019-07-10 DIAGNOSIS — J441 Chronic obstructive pulmonary disease with (acute) exacerbation: Secondary | ICD-10-CM | POA: Diagnosis not present

## 2019-07-10 MED ORDER — AZITHROMYCIN 250 MG PO TABS: ORAL_TABLET | ORAL | 0 refills | Status: DC

## 2019-07-10 MED ORDER — PREDNISONE 10 MG PO TABS: ORAL_TABLET | ORAL | 0 refills | Status: DC

## 2019-07-10 MED ORDER — CEFDINIR 300 MG PO CAPS: 300.0000 mg | ORAL_CAPSULE | Freq: Two times a day (BID) | ORAL | 0 refills | Status: DC

## 2019-07-10 NOTE — Assessment & Plan Note (Signed)
Treat as exacerbation -no clear cause identified during the phone interview Prednisone 10 mg tabs  Take 2 tabs daily with food x 5ds, then 1 tab daily with food x 5ds then STOP  Zpak  Take mucinex 600 mg daily x 7 days  Cal if no better

## 2019-07-10 NOTE — Assessment & Plan Note (Signed)
Okay to use 2.5 L of oxygen continuously. We will reassess oxygen needs at next visit

## 2019-07-10 NOTE — Progress Notes (Signed)
   Subjective:    Patient ID: [name], female    DOB: 1935/08/16, 84 y.o.   MRN: [id number]  HPI  I connected with  [name] on 07/10/19 by aand verified that I am speaking with the correct person using two identifiers.   I discussed the limitations of evaluation and management by telemedicine. The patient expressed understanding and agreed to proceed.    84 yo remote smoker for follow-up of COPD. She smoked 40 pack years before she quit in 2004  PMH - atrial fibrillation not on anticoagulation due to prior history of intracranial hemorrhagein 2016. Bladder CA ,  Breast CA s/p lt total mastectomy Exacerbation 12/21/2017 and 08/22/2018  Chief Complaint  Patient presents with  . Shortness of Breath    Increased in the last week, worse when laying down or exerting herself. Shortness of breath wakes her up about 4:00 am for last week as well. Both knees are swollen, but left is worse.    She underwent left total mastectomy 04/2019, does not need chemotherapy, refused hormonal therapy.  She states breathing is worse and she has had to increase her oxygen to 2.5 L continuously. She reports right knee swelling for the past 2 weeks, I note that venous duplex was - 01/2019 she denies ankle swelling or leg pains.  She is compliant with Pulmicort and Brovana and uses albuterol nebs as needed in between. She reports sticky occasionally brown mucus-no sick contacts, no fever. She is masking and social distancing during the pandemic   Significant tests/ events reviewed  Spirometry 02/2018  ratio 48, FEV1 37% and FVC 57% consistent with severe obstruction  Review of Systems neg for any significant sore throat, dysphagia, itching, sneezing, nasal congestion or excess/ purulent secretions, fever, chills, sweats, unintended wt loss, pleuritic or exertional cp, hempoptysis, orthopnea pnd or change in chronic leg swelling. Also denies presyncope, palpitations, heartburn, abdominal  pain, nausea, vomiting, diarrhea or change in bowel or urinary habits, dysuria,hematuria, rash, arthralgias, visual complaints, headache, numbness weakness or ataxia.     Objective:   Physical Exam   Unable since phone visit       Assessment & Plan:   See problem list charting  Total time spent x 21 minutes

## 2019-07-10 NOTE — Addendum Note (Signed)
Addended by: Nena Polio on: 07/10/2019 04:45 PM   Modules accepted: Orders

## 2019-07-10 NOTE — Progress Notes (Deleted)
Virtual Visit via Telephone Note  I connected with FALLEN CRISOSTOMO on 07/10/19 at  3:30 PM EST by telephone and verified that I am speaking with the correct person using two identifiers.  Location: Patient: Home Provider: Office [facility] - [address], [place], [place], [place] [address]   I discussed the limitations, risks, security and privacy concerns of performing an evaluation and management service by telephone and the availability of in person appointments. I also discussed with the patient that there may be a patient responsible charge related to this service. The patient expressed understanding and agreed to proceed.  Patient consented to consult via telephone: Yes People present and their role in pt care: Pt     History of Present Illness:    Chief complaint:     Observations/Objective:   Assessment and Plan:  No problem-specific Assessment & Plan notes found for this encounter.   Follow Up Instructions:  No follow-ups on file.   I discussed the assessment and treatment plan with the patient. The patient was provided an opportunity to ask questions and all were answered. The patient agreed with the plan and demonstrated an understanding of the instructions.   The patient was advised to call back or seek an in-person evaluation if the symptoms worsen or if the condition fails to improve as anticipated.  I provided *** minutes of non-face-to-face time during this encounter.   Lauraine Rinne, NP

## 2019-07-10 NOTE — Patient Instructions (Signed)
Prednisone 10 mg tabs  Take 2 tabs daily with food x 5ds, then 1 tab daily with food x 5ds then STOP  Zpak  Take mucinex 600 mg daily x 7 days  Cal if no better

## 2019-07-10 NOTE — Telephone Encounter (Signed)
Spoke with pharmacist, she stated that the zpak would cause the patient's digoxin levels to increase. She wanted to know if RA would prescribe another antibiotic. Spoke with RA on the phone since he had left for the day. He stated that it was ok for her to have Omnicef 363m twice daily for 7 days. Gave the pharmacist a verbal.   Nothing further needed at time of call.

## 2019-07-10 NOTE — Telephone Encounter (Signed)
Spoke with pt, states that she has had increased b/l leg swelling from feet to knees, increased sob Xseveral days.  Pt states that she has increased her supplemental O2 to 2.5LPM based on how she feels.  States she has a pulse ox at home but it does not work properly, so she is unable to obtain O2 sats.  Pt has had difficulty breathing when lying down flat, so has been sitting up/reclining to sleep Xseveral days. Pt has not been seen since May of this year- scheduled for 3:30 televisit with Aaron Edelman for further evaluation.  Nothing further needed at this time- will close encounter.

## 2019-07-17 ENCOUNTER — Telehealth: Payer: Self-pay | Admitting: Pulmonary Disease

## 2019-07-17 NOTE — Telephone Encounter (Signed)
Called the patient back and she stated she does not feel like she has gotten much better. Still has 2 days of prednisone left to take, but completed the antibiotic issued.  Set patient up for an appointment with Derl Barrow, NP on 07/20/19 at 2:30 for in office visit. Nothing further needed at this time.

## 2019-07-20 ENCOUNTER — Ambulatory Visit (INDEPENDENT_AMBULATORY_CARE_PROVIDER_SITE_OTHER): Payer: Medicare Other | Admitting: Primary Care

## 2019-07-20 ENCOUNTER — Other Ambulatory Visit: Payer: Self-pay

## 2019-07-20 ENCOUNTER — Encounter: Payer: Self-pay | Admitting: Primary Care

## 2019-07-20 ENCOUNTER — Ambulatory Visit (INDEPENDENT_AMBULATORY_CARE_PROVIDER_SITE_OTHER): Payer: Medicare Other

## 2019-07-20 VITALS — BP 116/70 | HR 74 | Temp 97.1°F | Ht 66.0 in | Wt 155.0 lb

## 2019-07-20 DIAGNOSIS — R0602 Shortness of breath: Secondary | ICD-10-CM | POA: Diagnosis not present

## 2019-07-20 DIAGNOSIS — I5042 Chronic combined systolic (congestive) and diastolic (congestive) heart failure: Secondary | ICD-10-CM | POA: Diagnosis not present

## 2019-07-20 DIAGNOSIS — R05 Cough: Secondary | ICD-10-CM | POA: Diagnosis not present

## 2019-07-20 NOTE — Progress Notes (Signed)
_0  ID: [name], female    DOB: Sep 15, 1934, 84 y.o.   MRN: [id number]  Chief Complaint  Patient presents with   COPD (chronic obstructive pulmonary disease)    Had televisit 07/10/19 w/Dr. Elsworth Soho. Wanted in office visit. Finished prednisone yesterday, feels better, but wants a chest xray.    Referring provider: Janith Lima, MD  HPI: 84 year old female, former smoker quit in 2004 (40 pack year hx). PMH significant for COPD, chronic respiratory failure with hypoxia (2L oxygen), HTN, afib (not anticoagulated), chronic combined systolic and diastolic congestive heart failure, breast cancer s/p resection, recurrent bladder cancer s/p TURP/chemo. Patient of Dr. Elsworth Soho, last seen on 07/10/19 for exacerbation treated with Zpack and prednisone taper. Maintained on Pulmicort and Brovana nebulizer twice daily. Up today with Pneumovax and Prevnar.   Previous [facility] pulmonary encounters: 01/14/2019 Contacted today by phone call for 3 month follow-up visit, she was not able to speak for long d/t currently moving. She is doing well, breathing is baseline. No recent exacerbations of her COPD. Only using Brovana/Pulmicort once a day because she has been so busy. She does feel her breathing is better when she uses her nebulizer's twice a day. Reports left knee swelling, ultrasound negative for DVT. She plans to follow up with her PCP regarding this and a rash on her ankles. Denies fever, cough or wheeze.   07/10/19 SOB increased in the last week, worse when laying down or exerting herself. Shortness of breath wakes her up about 4:00 am for last week as well. Both knees are swollen, but left is worse.  She underwent left total mastectomy 04/2019, does not need chemotherapy, refused hormonal therapy.  She states breathing is worse and she has had to increase her oxygen to 2.5 L continuously. She reports right knee swelling for the past 2 weeks, I note that venous duplex was - 01/2019 she denies ankle  swelling or leg pains.  She is compliant with Pulmicort and Brovana and uses albuterol nebs as needed in between. She reports sticky occasionally brown mucus-no sick contacts, no fever. She is masking and social distancing during the pandemic  07/20/2019 Patient presents today for follow-up visit. She is feeling some better. Finished omicef and prednisone prescribed by Dr. Elsworth Soho. States that her breathing is ok, not as good as it used to be. Gets short of breath with activity. Wearing 2.5L oxygen continuously. States that she is not coughing anything up. Taking mucinex twice a day. She has bilateral knee swelling which she reports is better than it was. Up to date with influenza vaccine.    02/2018 Spirometry-ratio 48, FEV1 37% and FVC 57% consistent with severe obstruction  Allergies  Allergen Reactions   Heparin     Previous history of hemorrhagic stroke.   Morphine And Related Nausea And Vomiting    Immunization History  Administered Date(s) Administered   Fluad Quad(high Dose 65+) 06/09/2019   Influenza, High Dose Seasonal PF 06/08/2016, 05/13/2017, 06/10/2018   Influenza-Unspecified 07/05/2015   Pneumococcal Conjugate-13 05/20/2014   Pneumococcal Polysaccharide-23 10/09/2011, 06/17/2017   Tdap 09/03/2005, 11/09/2015   Zoster 09/04/2007   Zoster Recombinat (Shingrix) 04/20/2019    Past Medical History:  Diagnosis Date   Arthritis    COPD (chronic obstructive pulmonary disease) ([place])    Dyspnea on exertion    Family history of breast cancer    Family history of colon cancer    Family history of ovarian cancer    Family history of prostate cancer  GERD (gastroesophageal reflux disease)    Glaucoma of both eyes    History of atrial fibrillation without current medication 2010   POST SURG 2010--  PER PT NO ISSUES SINCE   History of breast cancer    DX DCIS IN 2004--  S/P RIGHT MASTECTOMY , NO CHEMORADIATION---  NO RECURRENCE   History of CHF  (congestive heart failure)    POST SURG 2010   History of hypertension    History of shingles    02/ 2015--  back of neck and left flank--  no residual pain   ICH (intracerebral hemorrhage) ([place]) 02/05/2015   Nephrolithiasis    Recurrent bladder papillary carcinoma ([place]) first dx 06/ 2014   s/p  turbt's  and instillation mitomycin c (chemo)//dx again in 2015-different type   Stroke ([place])     Tobacco History: Social History   Tobacco Use  Smoking Status Former Smoker   Packs/day: 1.00   Years: 40.00   Pack years: 40.00   Types: Cigarettes   Quit date: 03/20/2003   Years since quitting: 16.3  Smokeless Tobacco Never Used   Counseling given: Not Answered   Outpatient Medications Prior to Visit  Medication Sig Dispense Refill   albuterol (VENTOLIN HFA) 108 (90 Base) MCG/ACT inhaler Inhale 2 puffs into the lungs every 6 (six) hours as needed for wheezing or shortness of breath. 8 g 2   arformoterol (BROVANA) 15 MCG/2ML NEBU USE 1 VIAL  IN  NEBULIZER TWICE  DAILY - (Morning and Evening) (Patient taking differently: Take 15 mcg by nebulization 2 (two) times daily. ) 60 mL 11   atorvastatin (LIPITOR) 10 MG tablet TAKE ONE TABLET DAILY AT 6PM (Patient taking differently: Take 10 mg by mouth daily at 6 PM. ) 90 tablet 1   budesonide (PULMICORT) 0.5 MG/2ML nebulizer solution Take 2 mLs (0.5 mg total) by nebulization 2 (two) times daily. 120 mL 5   digoxin (LANOXIN) 0.125 MG tablet Take 1 tablet (0.125 mg total) by mouth daily. 90 tablet 0   diltiazem (CARDIZEM CD) 240 MG 24 hr capsule TAKE ONE CAPSULE EACH DAY (Patient taking differently: Take 240 mg by mouth daily. ) 90 capsule 3   fluorometholone (FML) 0.1 % ophthalmic suspension Place 1 drop into both eyes 2 (two) times daily.      ipratropium (ATROVENT) 0.02 % nebulizer solution USE 2.42m BY NEBULIZER TWICE DAILY 150 mL 2   latanoprost (XALATAN) 0.005 % ophthalmic solution Place 1 drop into both eyes at bedtime.  Reported on 11/09/2015     OXYGEN Inhale 2 L into the lungs continuous.      pantoprazole (PROTONIX) 40 MG tablet TAKE ONE TABLET DAILY 90 tablet 1   SIMBRINZA 1-0.2 % SUSP Place 1 drop into both eyes 2 (two) times daily.   99   valACYclovir (VALTREX) 1000 MG tablet Take 1,000 mg by mouth daily.      cefdinir (OMNICEF) 300 MG capsule Take 1 capsule (300 mg total) by mouth 2 (two) times daily. (Patient not taking: Reported on 07/20/2019) 14 capsule 0   predniSONE (DELTASONE) 10 MG tablet Take 2 tablets daily with food x 5 days, then 1 tablet daily with food x 5 days then stop. (Patient not taking: Reported on 07/20/2019) 15 tablet 0   No facility-administered medications prior to visit.    Review of Systems  Review of Systems  Constitutional: Negative.   HENT: Negative.   Respiratory: Positive for cough and shortness of breath. Negative for chest tightness and  wheezing.   Cardiovascular: Positive for leg swelling.   Physical Exam  BP 116/70 (BP Location: Right Arm, Patient Position: Sitting, Cuff Size: Normal)    Pulse 74    Temp (!) 97.1 F (36.2 C)    Ht 5' 6" (1.676 m)    Wt 155 lb (70.3 kg)    LMP 12/03/2002    SpO2 95% Comment: on 2L of O2   BMI 25.02 kg/m  Physical Exam Constitutional:      Appearance: Normal appearance.  HENT:     Mouth/Throat:     Mouth: Mucous membranes are moist.     Pharynx: Oropharynx is clear.  Neck:     Musculoskeletal: Normal range of motion and neck supple.  Cardiovascular:     Rate and Rhythm: Normal rate. Rhythm irregular.  Pulmonary:     Comments: Mostly clear, ?scant rales right base Musculoskeletal:     Comments: Amb with cane  Skin:    General: Skin is warm and dry.  Neurological:     General: No focal deficit present.     Mental Status: She is alert and oriented to person, place, and time. Mental status is at baseline.  Psychiatric:        Mood and Affect: Mood normal.        Behavior: Behavior normal.        Thought Content:  Thought content normal.        Judgment: Judgment normal.     Lab Results:  CBC    Component Value Date/Time   WBC 6.3 05/05/2019 0151   RBC 3.93 05/05/2019 0151   HGB 13.0 05/05/2019 0151   HGB 13.9 04/08/2019 1239   HCT 40.3 05/05/2019 0151   PLT 190 05/05/2019 0151   PLT 223 04/08/2019 1239   MCV 102.5 (H) 05/05/2019 0151   MCH 33.1 05/05/2019 0151   MCHC 32.3 05/05/2019 0151   RDW 14.3 05/05/2019 0151   LYMPHSABS 1.7 04/08/2019 1239   MONOABS 0.8 04/08/2019 1239   EOSABS 0.1 04/08/2019 1239   BASOSABS 0.1 04/08/2019 1239    BMET    Component Value Date/Time   NA 130 (L) 05/05/2019 0151   NA 143 04/19/2017 0936   K 4.2 05/05/2019 0151   CL 100 05/05/2019 0151   CO2 23 05/05/2019 0151   GLUCOSE 162 (H) 05/05/2019 0151   BUN 12 05/05/2019 0151   BUN 20 04/19/2017 0936   CREATININE 0.69 05/05/2019 0151   CREATININE 0.83 04/08/2019 1239   CREATININE 0.69 07/11/2015 0910   CALCIUM 8.7 (L) 05/05/2019 0151   GFRNONAA >60 05/05/2019 0151   GFRNONAA >60 04/08/2019 1239   GFRAA >60 05/05/2019 0151   GFRAA >60 04/08/2019 1239    BNP    Component Value Date/Time   BNP 506.5 (H) 12/27/2017 0931    ProBNP    Component Value Date/Time   PROBNP 483.0 (H) 08/22/2018 1507    Imaging: No results found.   Assessment & Plan:   Chronic combined systolic and diastolic congestive heart failure (H[facility]) - Increased dyspnea on exertion with bilateral knee swelling - Grade 1 diastolic dysfunction, mild LVH on echocardiogram  - Checking BNP and CXR  COPD (chronic obstructive pulmonary disease) ([place]) - Acute exacerbation improved after Omnicef and prednisone - Continue Pulmicort, Brovana and Atrovent nebulizer's twice a day - Up to date with influenza vaccine    Martyn Ehrich, NP 07/20/2019

## 2019-07-20 NOTE — Assessment & Plan Note (Addendum)
-  Acute exacerbation improved after Omnicef and prednisone - Continue Pulmicort, Brovana and Atrovent nebulizer's twice a day - Up to date with influenza vaccine

## 2019-07-20 NOTE — Patient Instructions (Signed)
  Recommendations: Continue Pulmicort, Brovana and Atrovent nebulizer twice daily  Orders: CXR today Labs today  Follow-up: 2-3 months with Dr. Elsworth Soho

## 2019-07-20 NOTE — Assessment & Plan Note (Signed)
-  Increased dyspnea on exertion with bilateral knee swelling - Grade 1 diastolic dysfunction, mild LVH on echocardiogram  - Checking BNP and CXR

## 2019-07-21 LAB — BASIC METABOLIC PANEL
BUN: 19 mg/dL (ref 6–23)
CO2: 32 mEq/L (ref 19–32)
Calcium: 8.9 mg/dL (ref 8.4–10.5)
Chloride: 101 mEq/L (ref 96–112)
Creatinine, Ser: 0.72 mg/dL (ref 0.40–1.20)
GFR: 77.54 mL/min (ref >60.00)
Glucose, Bld: 83 mg/dL (ref 70–99)
Potassium: 4 mEq/L (ref 3.5–5.1)
Sodium: 140 mEq/L (ref 135–145)

## 2019-07-21 LAB — BRAIN NATRIURETIC PEPTIDE: Pro B Natriuretic peptide (BNP): 571 pg/mL — ABNORMAL HIGH (ref 0.0–100.0)

## 2019-07-21 NOTE — Progress Notes (Signed)
Labs showed for evidence of fluid overload. I believe she has diuretic at home. If she does I would like her to take 64m once daily x 1 week. She should follow-up with cardiology.

## 2019-07-22 ENCOUNTER — Telehealth: Payer: Self-pay | Admitting: Cardiology

## 2019-07-22 DIAGNOSIS — I5042 Chronic combined systolic (congestive) and diastolic (congestive) heart failure: Secondary | ICD-10-CM

## 2019-07-22 MED ORDER — FUROSEMIDE 20 MG PO TABS: 20.0000 mg | ORAL_TABLET | Freq: Every day | ORAL | 3 refills | Status: DC

## 2019-07-22 NOTE — Telephone Encounter (Signed)
New Message   Pt c/o swelling: STAT is pt has developed SOB within 24 hours  1) How much weight have you gained and in what time span?no   2) If swelling, where is the swelling located?both knees   3) Are you currently taking a fluid pill? Yes   4) Are you currently SOB? Yes   5) Do you have a log of your daily weights (if so, list)? No   6) Have you gained 3 pounds in a day or 5 pounds in a week? No   7) Have you traveled recently? No

## 2019-07-22 NOTE — Telephone Encounter (Signed)
Spoke with pt, for a couple weeks she has been on antibiotics and prednisone, she has developed SOB. She has some swelling in her feet and knees. Dr Elsworth Soho did a BNP and it was elevated. They gave the patient furosemide 20 mg once daily for 5 days. She took the first dose this morning. She denies orthopnea or chest pain. She would like to know what dr Stanford Breed thinks. Will forward for dr Stanford Breed review

## 2019-07-22 NOTE — Telephone Encounter (Signed)
Spoke with pt, Aware of dr Jacalyn Lefevre recommendations. New script sent to the pharmacy and Lab orders mailed to the pt

## 2019-07-22 NOTE — Telephone Encounter (Signed)
Agree with lasix 20 mg daily for 5 days and then daily as needed; bmet one week Kirk Ruths

## 2019-08-04 DIAGNOSIS — H401134 Primary open-angle glaucoma, bilateral, indeterminate stage: Secondary | ICD-10-CM | POA: Diagnosis not present

## 2019-08-04 DIAGNOSIS — B0052 Herpesviral keratitis: Secondary | ICD-10-CM | POA: Diagnosis not present

## 2019-08-05 ENCOUNTER — Encounter: Payer: Self-pay | Admitting: *Deleted

## 2019-08-05 LAB — BASIC METABOLIC PANEL
BUN/Creatinine Ratio: 23 (ref 12–28)
BUN: 18 mg/dL (ref 8–27)
CO2: 30 mmol/L — ABNORMAL HIGH (ref 20–29)
Calcium: 9 mg/dL (ref 8.7–10.3)
Chloride: 102 mmol/L (ref 96–106)
Creatinine, Ser: 0.77 mg/dL (ref 0.57–1.00)
GFR calc Af Amer: 83 mL/min/{1.73_m2} (ref >59)
GFR calc non Af Amer: 72 mL/min/{1.73_m2} (ref >59)
Glucose: 84 mg/dL (ref 65–99)
Potassium: 4.4 mmol/L (ref 3.5–5.2)
Sodium: 144 mmol/L (ref 134–144)

## 2019-08-18 DIAGNOSIS — I482 Chronic atrial fibrillation, unspecified: Secondary | ICD-10-CM | POA: Diagnosis not present

## 2019-08-18 DIAGNOSIS — C50412 Malignant neoplasm of upper-outer quadrant of left female breast: Secondary | ICD-10-CM | POA: Diagnosis not present

## 2019-08-18 DIAGNOSIS — Z803 Family history of malignant neoplasm of breast: Secondary | ICD-10-CM | POA: Diagnosis not present

## 2019-08-18 DIAGNOSIS — Z9011 Acquired absence of right breast and nipple: Secondary | ICD-10-CM | POA: Diagnosis not present

## 2019-08-18 DIAGNOSIS — Z800 Family history of malignant neoplasm of digestive organs: Secondary | ICD-10-CM | POA: Diagnosis not present

## 2019-08-18 DIAGNOSIS — I509 Heart failure, unspecified: Secondary | ICD-10-CM | POA: Diagnosis not present

## 2019-08-18 DIAGNOSIS — Z8679 Personal history of other diseases of the circulatory system: Secondary | ICD-10-CM | POA: Diagnosis not present

## 2019-08-18 DIAGNOSIS — J449 Chronic obstructive pulmonary disease, unspecified: Secondary | ICD-10-CM | POA: Diagnosis not present

## 2019-08-18 DIAGNOSIS — I10 Essential (primary) hypertension: Secondary | ICD-10-CM | POA: Diagnosis not present

## 2019-08-31 ENCOUNTER — Encounter: Payer: Self-pay | Admitting: *Deleted

## 2019-09-15 ENCOUNTER — Other Ambulatory Visit: Payer: Self-pay | Admitting: Cardiology

## 2019-09-21 ENCOUNTER — Other Ambulatory Visit: Payer: Medicare Other

## 2019-09-23 ENCOUNTER — Other Ambulatory Visit: Payer: Self-pay | Admitting: Pulmonary Disease

## 2019-09-28 ENCOUNTER — Other Ambulatory Visit: Payer: Self-pay | Admitting: Internal Medicine

## 2019-09-29 MED ORDER — IPRATROPIUM BROMIDE 0.02 % IN SOLN: RESPIRATORY_TRACT | 2 refills | Status: DC

## 2019-09-29 NOTE — Addendum Note (Signed)
Addended by: Jannette Spanner on: 09/29/2019 02:04 PM   Modules accepted: Orders

## 2019-10-06 ENCOUNTER — Other Ambulatory Visit: Payer: Self-pay | Admitting: Cardiology

## 2019-10-13 ENCOUNTER — Telehealth: Payer: Self-pay | Admitting: Pulmonary Disease

## 2019-10-13 ENCOUNTER — Other Ambulatory Visit: Payer: Self-pay

## 2019-10-13 ENCOUNTER — Ambulatory Visit (INDEPENDENT_AMBULATORY_CARE_PROVIDER_SITE_OTHER): Payer: Medicare Other | Admitting: Adult Health

## 2019-10-13 ENCOUNTER — Encounter: Payer: Self-pay | Admitting: Adult Health

## 2019-10-13 DIAGNOSIS — J441 Chronic obstructive pulmonary disease with (acute) exacerbation: Secondary | ICD-10-CM

## 2019-10-13 MED ORDER — IPRATROPIUM BROMIDE 0.02 % IN SOLN: 0.5000 mg | Freq: Three times a day (TID) | RESPIRATORY_TRACT | 5 refills | Status: DC

## 2019-10-13 MED ORDER — CEFDINIR 300 MG PO CAPS: 300.0000 mg | ORAL_CAPSULE | Freq: Two times a day (BID) | ORAL | 0 refills | Status: DC

## 2019-10-13 MED ORDER — PREDNISONE 20 MG PO TABS: 20.0000 mg | ORAL_TABLET | Freq: Every day | ORAL | 0 refills | Status: AC

## 2019-10-13 NOTE — Progress Notes (Signed)
Cardiology Clinic Note   Patient Name: [name] Date of Encounter: 10/14/2019  Primary Care Provider:  Janith Lima, MD Primary Cardiologist:  Kirk Ruths, MD  Patient Profile    [name] 85 year old female presents today for follow-up evaluation of her chronic combined systolic and diastolic CHF, atrial fibrillation with RVR, benign essential hypertension, COPD, and HLD.  Past Medical History    Past Medical History:  Diagnosis Date  . Arthritis   . COPD (chronic obstructive pulmonary disease) ([place])   . Dyspnea on exertion   . Family history of breast cancer   . Family history of colon cancer   . Family history of ovarian cancer   . Family history of prostate cancer   . GERD (gastroesophageal reflux disease)   . Glaucoma of both eyes   . History of atrial fibrillation without current medication 2010   POST SURG 2010--  PER PT NO ISSUES SINCE  . History of breast cancer    DX DCIS IN 2004--  S/P RIGHT MASTECTOMY , NO CHEMORADIATION---  NO RECURRENCE  . History of CHF (congestive heart failure)    POST SURG 2010  . History of hypertension   . History of shingles    02/ 2015--  back of neck and left flank--  no residual pain  . ICH (intracerebral hemorrhage) ([place]) 02/05/2015  . Nephrolithiasis   . Recurrent bladder papillary carcinoma ([place]) first dx 06/ 2014   s/p  turbt's  and instillation mitomycin c (chemo)//dx again in 2015-different type  . Stroke [facility])    Past Surgical History:  Procedure Laterality Date  . CATARACT EXTRACTION W/ INTRAOCULAR LENS  IMPLANT, BILATERAL    . CRANIECTOMY N/A 02/05/2015   Procedure: SUBOCCIPITAL CRANIECTOMY EVACUATION OF HEMATOMA;  Surgeon: Consuella Lose, MD;  Location: [place];  Service: Neurosurgery;  Laterality: N/A;  . CYSTOSCOPY W/ RETROGRADES Bilateral 08/11/2014   Procedure: CYSTOSCOPY WITH BILATERAL RETROGRADE PYELOGRAM AND MITOMYCIN INSTILLATION;  Surgeon: Alexis Frock, MD;  [facility];  Service: Urology;  Laterality: Bilateral;  . CYSTOSCOPY W/ URETERAL STENT PLACEMENT Bilateral 03/26/2013   Procedure: CYSTOSCOPY WITH BILATERAL RETROGRADE PYELOGRAM/URETERAL STENT PLACEMENT;  Surgeon: Alexis Frock, MD;  Location: [facility];  Service: Urology;  Laterality: Bilateral;  . CYSTOSCOPY W/ URETERAL STENT PLACEMENT Left 05/13/2013   Procedure: CYSTOSCOPY WITH RETROGRADE PYELOGRAM/URETERAL STENT PLACEMENT STENT EXCHANGE;  Surgeon: Alexis Frock, MD;  Location: [facility];  Service: Urology;  Laterality: Left;  . PARTIAL MASTECTOMY WITH NEEDLE LOCALIZATION Right 01-14-2003   DCIS  . REMOVAL CYST LEFT HAND  2013  . TOTAL KNEE ARTHROPLASTY Right 03-22-2009  . TOTAL MASTECTOMY Right 02-03-2003   W/ SLN BX  AND  POST 03-01-2003 EVACUATION HEMATOMA  . TOTAL MASTECTOMY Left 05/04/2019   Procedure: LEFT TOTAL MASTECTOMY;  Surgeon: Fanny Skates, MD;  Location: [place];  Service: General;  Laterality: Left;  . TRANSTHORACIC ECHOCARDIOGRAM  03-25-2009   MILD LVF/ EF 70-80%/ MILD INCREASE SYSTOLIC PULMONARY  PRESSURE  . TRANSURETHRAL RESECTION OF BLADDER TUMOR WITH GYRUS (TURBT-GYRUS) N/A 03/26/2013   Procedure: TRANSURETHRAL RESECTION OF BLADDER TUMOR WITH GYRUS (TURBT-GYRUS);  Surgeon: Alexis Frock, MD;  Location: [facility];  Service: Urology;  Laterality: N/A;  . TRANSURETHRAL RESECTION OF BLADDER TUMOR WITH GYRUS (TURBT-GYRUS) N/A 05/13/2013   Procedure: TRANSURETHRAL RESECTION OF BLADDER TUMOR WITH GYRUS (TURBT-GYRUS)  RE-STAGING TRANSURETHRAL RESECTION OF BLADDER TUMOR, LEFT RETROGRADE PYELOGRAM AND STENT EXCHANGE;  Surgeon: Alexis Frock, MD;  Location: [place];  Service: Urology;  Laterality: N/A;  . TRANSURETHRAL RESECTION OF BLADDER TUMOR WITH GYRUS (TURBT-GYRUS) N/A 08/11/2014   Procedure: TRANSURETHRAL RESECTION OF BLADDER TUMOR WITH GYRUS (TURBT-GYRUS);  Surgeon: Alexis Frock, MD;  Location: [facility];  Service: Urology;  Laterality: N/A;  . TUBAL LIGATION      Allergies  Allergies  Allergen Reactions  . Heparin     Previous history of hemorrhagic stroke.  Marland Kitchen Morphine And Related Nausea And Vomiting    History of Present Illness    [name] has a PMH of atrial fibrillation.  Her echocardiogram 02/2015 showed vigorous LV function, mild LVH, grade 1 DD, and mild right atrial enlargement.  She was admitted 02/2015 with a large right cerebral hemorrhage.  Her only anticoagulant at that time was aspirin.  The hemorrhage required surgical intervention/evacuation.  She developed atrial fibrillation during hospitalization which was difficult to control her heart rate.  She was felt not to be a candidate for anticoagulation given her intracranial hemorrhage.  A Holter 7/16 showed atrial fibrillation with elevated rate, digoxin was added.  Dr. Stanford Breed contacted neurosurgery to see if she would be a candidate for short-term anticoagulation which might qualify her for a watchman device.  Neurosurgery felt this could be pursued.  During her telehealth visit 01/2019 she described unilateral left lower extremity edema and erythema.  There was concern for possible DVT and she was scheduled for an ultrasound.  The lower extremity ultrasound was performed 01/02/2019 and showed no lower extremity DVT .  She was last seen by Dr. Stanford Breed on 01/02/2019.  During that time she was doing well and denied chest pain, palpitations, and syncope.  She did notice some dyspnea with exertion and had lower extremity edema with erythema that was ruled out for DVT.  She presents the clinic today and states she saw pulmonology yesterday who prescribed antibiotics, prednisone, extra Lasix, Mucinex, and continuation of her nebulizers.  She has not taken her extra dose of Lasix today due to needing to be able to make this cardiology appointment.  She has not had any fevers or chills.  She has noticed increased work  of breathing over the last week.  She does feel she has increased lower extremity edema today.  I will increase her Lasix for the next 3 days and give her 20 mEq of potassium for the next 3 days.  She is having more difficulty with her daily activities due to her COPD, oxygen dependence, and heart failure.  I will order a wheelchair for her.  Check a BMP in 1 week and have her follow-up in 1 week.  Her oxygen remains at 2 L/min.  She denies chest pain, shortness of breath, lower extremity edema, fatigue, palpitations, melena, hematuria, hemoptysis, diaphoresis, weakness, presyncope, syncope, orthopnea, and PND.   Home Medications    Prior to Admission medications   Medication Sig Start Date End Date Taking? Authorizing Provider  albuterol (VENTOLIN HFA) 108 (90 Base) MCG/ACT inhaler Inhale 2 puffs into the lungs every 6 (six) hours as needed for wheezing or shortness of breath. 06/22/19   Rigoberto Noel, MD  atorvastatin (LIPITOR) 10 MG tablet TAKE ONE TABLET DAILY AT 6PM 10/06/19   [name], MD  BROVANA 15 MCG/2ML NEBU USE 1 VIAL  IN  NEBULIZER TWICE  DAILY - (Morning and Evening) 09/28/19   Janith Lima, MD  budesonide (PULMICORT) 0.5 MG/2ML nebulizer solution Take 2 mLs (0.5 mg total)  by nebulization 2 (two) times daily. 06/01/19   Janith Lima, MD  cefdinir (OMNICEF) 300 MG capsule Take 1 capsule (300 mg total) by mouth 2 (two) times daily. 10/13/19   Parrett, Fonnie Mu, NP  digoxin (LANOXIN) 0.125 MG tablet Take 1 tablet (125 mcg total) by mouth daily. 09/15/19   [name], MD  diltiazem (CARDIZEM CD) 240 MG 24 hr capsule TAKE ONE CAPSULE EACH DAY 10/06/19   [name], MD  fluorometholone (FML) 0.1 % ophthalmic suspension Place 1 drop into both eyes daily.  05/31/19   [provider]  furosemide (LASIX) 20 MG tablet Take 1 tablet (20 mg total) by mouth daily. 07/22/19 10/20/19  [name], MD  guaiFENesin (MUCINEX) 600 MG 12 hr tablet Take 600 mg by mouth  daily.    [provider]  ipratropium (ATROVENT) 0.02 % nebulizer solution Take 2.5 mLs (0.5 mg total) by nebulization 3 (three) times daily. DX: J44.9 10/13/19   Parrett, Fonnie Mu, NP  latanoprost (XALATAN) 0.005 % ophthalmic solution Place 1 drop into both eyes at bedtime. Reported on 11/09/2015    [provider]  OXYGEN Inhale 2 L into the lungs continuous.     [provider]  pantoprazole (PROTONIX) 40 MG tablet TAKE ONE TABLET DAILY 05/20/19   Janith Lima, MD  predniSONE (DELTASONE) 20 MG tablet Take 1 tablet (20 mg total) by mouth daily with breakfast for 3 days. 10/13/19 10/16/19  Parrett, Fonnie Mu, NP  SIMBRINZA 1-0.2 % SUSP Place 1 drop into both eyes 2 (two) times daily.  12/23/14   [provider]  valACYclovir (VALTREX) 1000 MG tablet Take 1,000 mg by mouth daily.  03/11/19   [provider]    Family History    Family History  Problem Relation Age of Onset  . Colon cancer Mother 49  . Colon cancer Maternal Grandmother   . Stroke Maternal Grandmother   . Breast cancer Maternal Aunt 45  . Cancer Maternal Aunt        ? ovarian  . Breast cancer Other 60       breast cancer, ovarian cancer  . Breast cancer Daughter 86  . Stroke Paternal Grandmother   . Prostate cancer Paternal Grandfather   . Heart attack Neg Hx   . Hypertension Neg Hx    She indicated that her mother is deceased. She indicated that her father is deceased. She indicated that her maternal grandmother is deceased. She indicated that her maternal grandfather is deceased. She indicated that her paternal grandmother is deceased. She indicated that her paternal grandfather is deceased. She indicated that all of her three daughters are alive. She indicated that her son is alive. She indicated that both of her maternal aunts are deceased. She indicated that her maternal uncle is deceased. She indicated that her paternal aunt is deceased. She indicated that her paternal uncle is  deceased. She indicated that the status of her neg hx is unknown. She indicated that the status of her other is unknown.  Social History    Social History   Socioeconomic History  . Marital status: Married    Spouse name: Not on file  . Number of children: 4  . Years of education: Not on file  . Highest education level: Not on file  Occupational History  . Not on file  Tobacco Use  . Smoking status: Former Smoker    Packs/day: 1.00    Years: 40.00  Pack years: 40.00    Types: Cigarettes    Quit date: 03/20/2003    Years since quitting: 16.5  . Smokeless tobacco: Never Used  Substance and Sexual Activity  . Alcohol use: No    Alcohol/week: 0.0 standard drinks  . Drug use: No  . Sexual activity: Not Currently    Partners: Male    Birth control/protection: Surgical    Comment: BTL  Other Topics Concern  . Not on file  Social History Narrative  . Not on file   Social Determinants of Health   Financial Resource Strain:   . Difficulty of Paying Living Expenses: Not on file  Food Insecurity:   . Worried About Charity fundraiser in the Last Year: Not on file  . Ran Out of Food in the Last Year: Not on file  Transportation Needs:   . Lack of Transportation (Medical): Not on file  . Lack of Transportation (Non-Medical): Not on file  Physical Activity:   . Days of Exercise per Week: Not on file  . Minutes of Exercise per Session: Not on file  Stress:   . Feeling of Stress : Not on file  Social Connections:   . Frequency of Communication with Friends and Family: Not on file  . Frequency of Social Gatherings with Friends and Family: Not on file  . Attends Religious Services: Not on file  . Active Member of Clubs or Organizations: Not on file  . Attends Archivist Meetings: Not on file  . Marital Status: Not on file  Intimate Partner Violence:   . Fear of Current or Ex-Partner: Not on file  . Emotionally Abused: Not on file  . Physically Abused: Not on file    . Sexually Abused: Not on file     Review of Systems    General:  No chills, fever, night sweats or weight changes.  Cardiovascular:  No chest pain, dyspnea on exertion, edema, orthopnea, palpitations, paroxysmal nocturnal dyspnea. Dermatological: No rash, lesions/masses Respiratory: No cough, dyspnea Urologic: No hematuria, dysuria Abdominal:   No nausea, vomiting, diarrhea, bright red blood per rectum, melena, or hematemesis Neurologic:  No visual changes, wkns, changes in mental status. All other systems reviewed and are otherwise negative except as noted above.  Physical Exam    VS:  BP 117/64   Pulse 79   Ht _0  (1.676 m)   Wt 155 lb (70.3 kg)   LMP 12/03/2002   BMI 25.02 kg/m  , BMI Body mass index is 25.02 kg/m. GEN: Well nourished, well developed, in no acute distress. HEENT: normal. Neck: Supple, no JVD, carotid bruits, or masses. Cardiac: RRR, no murmurs, rubs, or gallops. No clubbing, cyanosis, bilateral lower extremity +1 pitting edema.  Radials/DP/PT 2+ and equal bilaterally.  Respiratory:  Respirations shallow and unlabored, clear to auscultation bilaterally. GI: Soft, nontender, nondistended, BS + x 4. MS: no deformity or atrophy. Skin: warm and dry, no rash. Neuro:  Strength and sensation are intact. Psych: Normal affect.  Accessory Clinical Findings    ECG personally reviewed by me today-atrial fibrillation LVH with QRS widening ST elevation consider lateral injury 79 bpm- No acute changes   EKG 01/02/2019 Atrial fibrillation RSR, LVH, ST abnormality anteriolateral subendocardial injury 71 bpm  Echocardiogram 02/07/2015 Study Conclusions   - Procedure narrative: Transthoracic echocardiography. Image  quality was poor. The study was technically difficult, as a  result of poor acoustic windows, poor sound wave transmission,  and restricted patient mobility.  - Left  ventricle: The cavity size was normal. Wall thickness was  increased in a  pattern of mild LVH. Systolic function was  vigorous. The estimated ejection fraction was in the range of 65%  to 70%. Wall motion was normal; there were no regional wall  motion abnormalities. Doppler parameters are consistent with  abnormal left ventricular relaxation (grade 1 diastolic  dysfunction). The E/e&' ratio is between 8-15, suggesting  indeterminate LV filling pressure.  - Aortic valve: Poorly visualized. There was no stenosis. There was  no regurgitation.  - Mitral valve: Calcified annulus. There was trivial regurgitation.  - Left atrium: The atrium was normal in size.  - Right atrium: The atrium was mildly dilated.  Lower extremity Dopplers 01/02/2019 Summary:  Right: No evidence of common femoral vein obstruction.    Left: No evidence of deep vein thrombosis in the lower extremity. No  indirect evidence of obstruction proximal to the inguinal ligament. A  cystic structure is found in the popliteal fossa, measuring 3.9 x .60 s  2.0 cm. Inhomogeneous enlarged lymph node  adjacent to the proximal GSV in the groin/proximal thigh area, measuring  1.8 x 1.2 cm.     Assessment & Plan   1.  Permanent atrial fibrillation-EKG today shows atrial fibrillation left ventricular hypertrophy with QRS widening 79 bpm.  Not a candidate for anticoagulation due to intracranial hemorrhage on aspirin.  This required surgical evacuation.  Previously discussed possibility of short-term anticoagulation and placement of watchman device.  Neurosurgery felt this would be a possibility.  However, [name] did not want to go forward with this.  She has expressed understanding of higher risk of CVA. Continue Cardizem 240 daily Continue digoxin 0.25 mcg Avoid triggers caffeine, chocolate, EtOH, OSA, etc.  Essential hypertension-BP today 117/64.  Well-controlled at home. Continue diltiazem 240 mg daily Continue furosemide 20 mg daily Heart healthy low-sodium diet-salty 6 Increase  physical activity as tolerated  Hyperlipidemia-  LDL 52 04/20/19 Continue atorvastatin 10 mg daily Heart healthy low-sodium high-fiber diet Increase physical activity as tolerated  Acute on chronic diastolic CHF-bilateral lower extremity +1 pitting edema today.  Weight today 155 pounds.  She is having a harder time getting around doing daily activities and demonstrates poor activity tolerance. Continue digoxin 0.125 mg daily Increase furosemide to 40 mg daily x3 days then continue furosemide 20 mg daily Start potassium 20 mEq x 3 days then stop Heart healthy low-sodium diet-salty 6 given Increase physical activity as tolerated Continue fluid restriction Lower extremity support stockings Daily weights-log given to have to put all these in my note Order BMP in 1 week Order a wheelchair.  COPD-  Continues on 2 L oxygen, oxygen dependent.  Had visit with pulmonology 10/13/2019 who prescribed  who prescribed antibiotics, prednisone, extra Lasix, Mucinex, and continuation of her nebulizers.  2-week follow-up Followed by pulmonary  Disposition: Follow-up with me in 1 week  Jossie Ng. [facility] [address] Office [phone] Fax [phone]

## 2019-10-13 NOTE — Patient Instructions (Addendum)
Omnicef 321m Twice daily  For 1 wek .  Prednisone 228mdaily for 3 days .  Take extra Lasix 2030maily for 1 day.  Continue on Pulmicort and Brovana Neb Twice daily  .  Increase Ipratropium Neb Three times a day  .  Mucinex DM Twice daily  As needed  Cough /congestion  Follow up with Cardiology as planned.  Follow up in 2 weeks with Dr. AlvElsworth Sohond As needed   Please contact office for sooner follow up if symptoms do not improve or worsen or seek emergency care

## 2019-10-13 NOTE — Telephone Encounter (Signed)
I called and spoke with patient and she states that for the past 2 weeks she has had chest congestion and unable to produce sputum. She state that its getting worse and this morning she coughed up yellow sputum. She denies any fever. I have scheduled her with Tammy for a televisit today at 3:30.

## 2019-10-13 NOTE — Telephone Encounter (Signed)
Patient is stating she thinks she has an infection and was wanting to get and antibiotic and prednisone. She states she has some congestion that she can not cough up and also that her breathing is getting worse. Pt can be reached at [phone]

## 2019-10-13 NOTE — Progress Notes (Signed)
Virtual Visit via Telephone Note  I connected with [name] on 10/13/19 at  3:30 PM EST by telephone and verified that I am speaking with the correct person using two identifiers.  Location: Patient: Home  Provider: Office    I discussed the limitations, risks, security and privacy concerns of performing an evaluation and management service by telephone and the availability of in person appointments. I also discussed with the patient that there may be a patient responsible charge related to this service. The patient expressed understanding and agreed to proceed.   History of Present Illness: 85 year old female former smoker followed for COPD. Medical history significant for A. Fib, diastolic heart failure  Today's televisit is for an acute office visit.  Patient complains over the last week that she has had increased cough congestion with thick yellow mucus.  Has had increased shortness of breath with activity.  She denies any hemoptysis, chest pain, fever, loss of taste or smell no known sick contacts.  She denies any hemoptysis. She remains on Pulmicort and Brovana and ipratropium nebulizers twice daily. Patient says she has had some increased ankle edema but not too bad.  Labs in November showed elevated BNP at 571.  Patient denies orthopnea.  Patient Active Problem List   Diagnosis Date Noted  . Genetic testing 04/17/2019  . Malignant neoplasm of upper-outer quadrant of left breast in female, estrogen receptor positive ([place]) 04/03/2019  . Chronic respiratory failure with hypoxia ([place]) 01/08/2018  . History of intracranial hemorrhage 12/27/2017  . Eczema, dyshidrotic 05/09/2016  . Routine general medical examination at a health care facility 11/12/2015  . Prediabetes 11/09/2015  . Estrogen deficiency 11/09/2015  . Alkaline phosphatase elevation 07/12/2015  . Gait disturbance, post-stroke 04/22/2015  . Cerebellar hemorrhage, nontraumatic (H[facility]) 02/22/2015  . Benign essential  HTN 02/21/2015  . Chronic combined systolic and diastolic congestive heart failure ([place])   . Atrial fibrillation with RVR ([place])   . HLD (hyperlipidemia)   . Obstructive hydrocephalus ([place])   . COPD (chronic obstructive pulmonary disease) ([place]) 12/30/2014  . GERD (gastroesophageal reflux disease) 12/30/2014  . Bladder cancer ([place]) 01/01/2014  . Glaucoma 01/01/2014   Current Outpatient Medications on File Prior to Visit  Medication Sig Dispense Refill  . albuterol (VENTOLIN HFA) 108 (90 Base) MCG/ACT inhaler Inhale 2 puffs into the lungs every 6 (six) hours as needed for wheezing or shortness of breath. 8 g 2  . atorvastatin (LIPITOR) 10 MG tablet TAKE ONE TABLET DAILY AT 6PM 90 tablet 0  . BROVANA 15 MCG/2ML NEBU USE 1 VIAL  IN  NEBULIZER TWICE  DAILY - (Morning and Evening) 60 mL 5  . budesonide (PULMICORT) 0.5 MG/2ML nebulizer solution Take 2 mLs (0.5 mg total) by nebulization 2 (two) times daily. 120 mL 5  . digoxin (LANOXIN) 0.125 MG tablet Take 1 tablet (125 mcg total) by mouth daily. 90 tablet 0  . diltiazem (CARDIZEM CD) 240 MG 24 hr capsule TAKE ONE CAPSULE EACH DAY 90 capsule 0  . fluorometholone (FML) 0.1 % ophthalmic suspension Place 1 drop into both eyes daily.     . furosemide (LASIX) 20 MG tablet Take 1 tablet (20 mg total) by mouth daily. 90 tablet 3  . guaiFENesin (MUCINEX) 600 MG 12 hr tablet Take 600 mg by mouth daily.    Marland Kitchen ipratropium (ATROVENT) 0.02 % nebulizer solution USE 2.19m BY NEBULIZER TWICE DAILY. DX: J44.9 150 mL 2  . latanoprost (XALATAN) 0.005 % ophthalmic solution Place 1 drop into both  eyes at bedtime. Reported on 11/09/2015    . OXYGEN Inhale 2 L into the lungs continuous.     . pantoprazole (PROTONIX) 40 MG tablet TAKE ONE TABLET DAILY 90 tablet 1  . SIMBRINZA 1-0.2 % SUSP Place 1 drop into both eyes 2 (two) times daily.   99  . valACYclovir (VALTREX) 1000 MG tablet Take 1,000 mg by mouth daily.      No current facility-administered medications on file  prior to visit.    Observations/Objective: 10/13/2019 -speaks in full sentences without audible wheezing   Spirometry 02/2018  ratio 48, FEV1 37% and FVC 57% consistent with severe obstruction Assessment and Plan:  Acute COPD exacerbation  Diastolic heart failure-May have component of some mild fluid buildup.  We will use gentle diuresis.  Patient does follow-up with cardiology tomorrow.  Plan  Patient Instructions  Omnicef 347m Twice daily  For 1 wek .  Prednisone 257mdaily for 3 days .  Take extra Lasix 206maily for 1 day.  Continue on Pulmicort and Brovana Neb Twice daily  .  Increase Ipratropium Neb Three times a day  .  Mucinex DM Twice daily  As needed  Cough /congestion  Follow up with Cardiology as planned.  Follow up in 2 weeks with Dr. AlvElsworth Sohond As needed   Please contact office for sooner follow up if symptoms do not improve or worsen or seek emergency care       Follow Up Instructions:   Follow-up and 2 weeks and as needed Please contact office for sooner follow up if symptoms do not improve or worsen or seek emergency care   I discussed the assessment and treatment plan with the patient. The patient was provided an opportunity to ask questions and all were answered. The patient agreed with the plan and demonstrated an understanding of the instructions.   The patient was advised to call back or seek an in-person evaluation if the symptoms worsen or if the condition fails to improve as anticipated.  I provided 22  minutes of non-face-to-face time during this encounter.   [name]

## 2019-10-14 ENCOUNTER — Encounter: Payer: Self-pay | Admitting: General Practice

## 2019-10-14 ENCOUNTER — Ambulatory Visit (INDEPENDENT_AMBULATORY_CARE_PROVIDER_SITE_OTHER): Payer: Medicare Other | Admitting: General Practice

## 2019-10-14 ENCOUNTER — Other Ambulatory Visit: Payer: Self-pay

## 2019-10-14 VITALS — BP 117/64 | HR 79 | Ht 66.0 in | Wt 155.0 lb

## 2019-10-14 DIAGNOSIS — Z79899 Other long term (current) drug therapy: Secondary | ICD-10-CM | POA: Diagnosis not present

## 2019-10-14 DIAGNOSIS — E7800 Pure hypercholesterolemia, unspecified: Secondary | ICD-10-CM | POA: Diagnosis not present

## 2019-10-14 DIAGNOSIS — J441 Chronic obstructive pulmonary disease with (acute) exacerbation: Secondary | ICD-10-CM | POA: Diagnosis not present

## 2019-10-14 DIAGNOSIS — Z789 Other specified health status: Secondary | ICD-10-CM | POA: Diagnosis not present

## 2019-10-14 DIAGNOSIS — I5042 Chronic combined systolic (congestive) and diastolic (congestive) heart failure: Secondary | ICD-10-CM

## 2019-10-14 DIAGNOSIS — Z9981 Dependence on supplemental oxygen: Secondary | ICD-10-CM

## 2019-10-14 DIAGNOSIS — I4891 Unspecified atrial fibrillation: Secondary | ICD-10-CM

## 2019-10-14 MED ORDER — FUROSEMIDE 20 MG PO TABS: 20.0000 mg | ORAL_TABLET | Freq: Every day | ORAL | 3 refills | Status: DC

## 2019-10-14 MED ORDER — POTASSIUM CHLORIDE CRYS ER 20 MEQ PO TBCR: 20.0000 meq | EXTENDED_RELEASE_TABLET | Freq: Every day | ORAL | 0 refills | Status: DC

## 2019-10-14 NOTE — Patient Instructions (Addendum)
Medication Instructions:  TAKE LASIX 40MG X3 DAYS (Thursday, Friday, SATURDAY)THEN BACK TO 20MG DAILY POTASSIUM ONE DAILY FOR 3 DAYS ONLY  If you need a refill on your cardiac medications before your next appointment, please call your pharmacy.  Labwork: BMET 3 DAYS BEFORE FOLLOW UP APPOINTMENT HERE IN OUR OFFICE AT [facility]     You will NOT need to fast   If you have labs (blood work) drawn today and your tests are completely normal, you will receive your results only by: Marland Kitchen MyChart Message (if you have MyChart) OR A paper copy in the mail If you have any lab test that is abnormal or we need to change your treatment, we will call you to review these results. You may go to any [facility] lab that is convenient for you however, we do have a lab in our office that is able to assist you. You do NOT need an appointment for our lab. Once in our office in our office lobby there is a podium where you sign-in and ring the doorbell to alert [place] that you are here. Lab is open 8:00am and closes at 4:00pm; closes for lunch from 12:45 - 1:45pm. PLEASE BRING A COPY OF YOUR INSURANCE CARD WITH YOU.  Special Instructions: WHEELCHAIR RX PROVIDED YOU MAY TAKE TO ANY MEDICAL SUPPLY  PLEASE TAKE AND LOG YOUR WEIGHT DAILY  PLEASE READ AND FOLLOW SALTY 6 ATTACHED  Reduce your risk of getting COVID-19 With your heart disease it is especially important for people at increased risk of severe illness from COVID-19, and those who live with them, to protect themselves from getting COVID-19. The best way to protect yourself and to help reduce the spread of the virus that causes COVID-19 is to: Marland Kitchen Limit your interactions with other people as much as possible. . Take precautions to prevent getting COVID-19 when you do interact with others. If you start feeling sick and think you may have COVID-19, get in touch with your healthcare provider within 24 hours.  Follow-Up: IN ONE WEEK  In Person Coletta Memos, [place].    At [facility] you and your health needs are our priority.  As part of our continuing mission to provide you with exceptional heart care, we have created designated Provider Care Teams.  These Care Teams include your primary Cardiologist (physician) and Advanced Practice Providers (APPs -  Physician Assistants and Nurse Practitioners) who all work together to provide you with the care you need, when you need it.  Thank you for choosing [facility] at [facility]!!

## 2019-10-19 DIAGNOSIS — I5042 Chronic combined systolic (congestive) and diastolic (congestive) heart failure: Secondary | ICD-10-CM | POA: Diagnosis not present

## 2019-10-19 DIAGNOSIS — J441 Chronic obstructive pulmonary disease with (acute) exacerbation: Secondary | ICD-10-CM | POA: Diagnosis not present

## 2019-10-19 DIAGNOSIS — I4891 Unspecified atrial fibrillation: Secondary | ICD-10-CM | POA: Diagnosis not present

## 2019-10-19 DIAGNOSIS — Z789 Other specified health status: Secondary | ICD-10-CM | POA: Diagnosis not present

## 2019-10-19 DIAGNOSIS — Z79899 Other long term (current) drug therapy: Secondary | ICD-10-CM | POA: Diagnosis not present

## 2019-10-19 DIAGNOSIS — Z9981 Dependence on supplemental oxygen: Secondary | ICD-10-CM | POA: Diagnosis not present

## 2019-10-20 LAB — BASIC METABOLIC PANEL
BUN/Creatinine Ratio: 22 (ref 12–28)
BUN: 19 mg/dL (ref 8–27)
CO2: 29 mmol/L (ref 20–29)
Calcium: 9.3 mg/dL (ref 8.7–10.3)
Chloride: 100 mmol/L (ref 96–106)
Creatinine, Ser: 0.86 mg/dL (ref 0.57–1.00)
GFR calc Af Amer: 73 mL/min/{1.73_m2} (ref >59)
GFR calc non Af Amer: 63 mL/min/{1.73_m2} (ref >59)
Glucose: 68 mg/dL (ref 65–99)
Potassium: 4.4 mmol/L (ref 3.5–5.2)
Sodium: 145 mmol/L — ABNORMAL HIGH (ref 134–144)

## 2019-10-21 NOTE — Progress Notes (Signed)
Virtual Visit via Telephone Note   This visit type was conducted due to national recommendations for restrictions regarding the COVID-19 Pandemic (e.g. social distancing) in an effort to limit this patient's exposure and mitigate transmission in our community.  Due to her co-morbid illnesses, this patient is at least at moderate risk for complications without adequate follow up.  This format is felt to be most appropriate for this patient at this time.  The patient did not have access to video technology/had technical difficulties with video requiring transitioning to audio format only (telephone).  All issues noted in this document were discussed and addressed.  No physical exam could be performed with this format.  Please refer to the patient's chart for her  consent to telehealth for [facility].  Evaluation Performed:  Follow-up visit  This visit type was conducted due to national recommendations for restrictions regarding the COVID-19 Pandemic (e.g. social distancing).  This format is felt to be most appropriate for this patient at this time.  All issues noted in this document were discussed and addressed.  No physical exam was performed (except for noted visual exam findings with Video Visits).  Please refer to the patient's chart (MyChart message for video visits and phone note for telephone visits) for the patient's consent to telehealth for [facility]  Date:  10/22/2019   ID:  [name], DOB 10/14/1934, MRN [id number]  Patient Location:  [place] [address]   Provider location:   [address] Office [phone] Fax [phone]   PCP:  Janith Lima, MD  Cardiologist:  Kirk Ruths, MD  Electrophysiologist:  None   Chief Complaint: Follow-up  History of Present Illness:    [name] is a 85 y.o. female who presents via audio/video conferencing for a telehealth visit  today.  Patient verified DOB and address.  The patient does not symptoms concerning for COVID-19 infection (fever, chills, cough, or new SHORTNESS OF BREATH).   [name] has a PMH of atrial fibrillation.  Her echocardiogram 02/2015 showed vigorous LV function, mild LVH, grade 1 DD, and mild right atrial enlargement.  She was admitted 02/2015 with a large right cerebral hemorrhage.  Her only anticoagulant at that time was aspirin.  The hemorrhage required surgical intervention/evacuation.  She developed atrial fibrillation during hospitalization which was difficult to control her heart rate.  She was felt not to be a candidate for anticoagulation given her intracranial hemorrhage.  A Holter 7/16 showed atrial fibrillation with elevated rate, digoxin was added.  Dr. Stanford Breed contacted neurosurgery to see if she would be a candidate for short-term anticoagulation which might qualify her for a watchman device.  Neurosurgery felt this could be pursued.  During her telehealth visit 01/2019 she described unilateral left lower extremity edema and erythema.  There was concern for possible DVT and she was scheduled for an ultrasound.  The lower extremity ultrasound was performed 01/02/2019 and showed no lower extremity DVT .  She was last seen by Dr. Stanford Breed on 01/02/2019.  During that time she was doing well and denied chest pain, palpitations, and syncope.  She did notice some dyspnea with exertion and had lower extremity edema with erythema that was ruled out for DVT.  She presented the clinic 10/14/2019 and stated she saw pulmonology 10/13/2019 who prescribed antibiotics, prednisone, extra Lasix, Mucinex, and continuation of her nebulizers.  She had not taken her extra dose of Lasix that  day due to needing to be able to make the cardiology appointment.  She had not had any fevers or chills.  She had noticed increased work of breathing over the last week.  She did feel she had increased lower extremity edema.  I will  increased her Lasix for the next 3 days and gave her 20 mEq of potassium for the next 3 days.  She was having more difficulty with her daily activities due to her COPD, oxygen dependence, and heart failure.  I order a wheelchair for her.  Her oxygen remained at 2 L/min.  She is seen virtually in follow-up today and states she is feeling like she is almost back to her baseline with her breathing.  However, she notices that her COPD has progressed over time.  Today she is using 2 L of oxygen.  Her weight is back down to her baseline at 149 pounds.  She was able to have lab work drawn earlier in the week and her renal function is stable.  She states she has a follow-up appointment with pulmonology Tuesday for a follow-up evaluation of her COPD exacerbation.  She states that she has much less swelling in her lower extremities however, she has chronic swelling in her left knee as a result of an injury that she suffered several years ago.  I have instructed her to resume her normal Lasix dosing, follow a low-sodium diet, weigh daily, and we will see her back in 3 months.  She denies chest pain, increased shortness of breath, lower extremity edema, fatigue, palpitations, melena, hematuria, hemoptysis, diaphoresis, weakness, syncope, orthopnea, and PND.   Prior CV studies:   The following studies were reviewed today:  EKG 10/14/2019 atrial fibrillation LVH with QRS widening ST elevation consider lateral injury 79 bpm- No acute changes   EKG 01/02/2019 Atrial fibrillation RSR, LVH, ST abnormality anteriolateral subendocardial injury 71 bpm  Echocardiogram 02/07/2015 Study Conclusions   - Procedure narrative: Transthoracic echocardiography. Image  quality was poor. The study was technically difficult, as a  result of poor acoustic windows, poor sound wave transmission,  and restricted patient mobility.  - Left ventricle: The cavity size was normal. Wall thickness was  increased in a pattern of  mild LVH. Systolic function was  vigorous. The estimated ejection fraction was in the range of 65%  to 70%. Wall motion was normal; there were no regional wall  motion abnormalities. Doppler parameters are consistent with  abnormal left ventricular relaxation (grade 1 diastolic  dysfunction). The E/e&' ratio is between 8-15, suggesting  indeterminate LV filling pressure.  - Aortic valve: Poorly visualized. There was no stenosis. There was  no regurgitation.  - Mitral valve: Calcified annulus. There was trivial regurgitation.  - Left atrium: The atrium was normal in size.  - Right atrium: The atrium was mildly dilated.  Lower extremity Dopplers 01/02/2019 Summary:  Right: No evidence of common femoral vein obstruction.    Left: No evidence of deep vein thrombosis in the lower extremity. No  indirect evidence of obstruction proximal to the inguinal ligament. A  cystic structure is found in the popliteal fossa, measuring 3.9 x .60 s  2.0 cm. Inhomogeneous enlarged lymph node  adjacent to the proximal GSV in the groin/proximal thigh area, measuring  1.8 x 1.2 cm.   Past Medical History:  Diagnosis Date  . Arthritis   . COPD (chronic obstructive pulmonary disease) ([place])   . Dyspnea on exertion   . Family history of breast cancer   .  Family history of colon cancer   . Family history of ovarian cancer   . Family history of prostate cancer   . GERD (gastroesophageal reflux disease)   . Glaucoma of both eyes   . History of atrial fibrillation without current medication 2010   POST SURG 2010--  PER PT NO ISSUES SINCE  . History of breast cancer    DX DCIS IN 2004--  S/P RIGHT MASTECTOMY , NO CHEMORADIATION---  NO RECURRENCE  . History of CHF (congestive heart failure)    POST SURG 2010  . History of hypertension   . History of shingles    02/ 2015--  back of neck and left flank--  no residual pain  . ICH (intracerebral hemorrhage) ([place]) 02/05/2015  . Nephrolithiasis     . Recurrent bladder papillary carcinoma ([place]) first dx 06/ 2014   s/p  turbt's  and instillation mitomycin c (chemo)//dx again in 2015-different type  . Stroke [facility])    Past Surgical History:  Procedure Laterality Date  . CATARACT EXTRACTION W/ INTRAOCULAR LENS  IMPLANT, BILATERAL    . CRANIECTOMY N/A 02/05/2015   Procedure: SUBOCCIPITAL CRANIECTOMY EVACUATION OF HEMATOMA;  Surgeon: Consuella Lose, MD;  Location: [place] [facility];  Service: Neurosurgery;  Laterality: N/A;  . CYSTOSCOPY W/ RETROGRADES Bilateral 08/11/2014   Procedure: CYSTOSCOPY WITH BILATERAL RETROGRADE PYELOGRAM AND MITOMYCIN INSTILLATION;  Surgeon: Alexis Frock, MD;  Location: [facility];  Service: Urology;  Laterality: Bilateral;  . CYSTOSCOPY W/ URETERAL STENT PLACEMENT Bilateral 03/26/2013   Procedure: CYSTOSCOPY WITH BILATERAL RETROGRADE PYELOGRAM/URETERAL STENT PLACEMENT;  Surgeon: Alexis Frock, MD;  [facility];  Service: Urology;  Laterality: Bilateral;  . CYSTOSCOPY W/ URETERAL STENT PLACEMENT Left 05/13/2013   Procedure: CYSTOSCOPY WITH RETROGRADE PYELOGRAM/URETERAL STENT PLACEMENT STENT EXCHANGE;  Surgeon: Alexis Frock, MD;  Location: [facility];  Service: Urology;  Laterality: Left;  . PARTIAL MASTECTOMY WITH NEEDLE LOCALIZATION Right 01-14-2003   DCIS  . REMOVAL CYST LEFT HAND  2013  . TOTAL KNEE ARTHROPLASTY Right 03-22-2009  . TOTAL MASTECTOMY Right 02-03-2003   W/ SLN BX  AND  POST 03-01-2003 EVACUATION HEMATOMA  . TOTAL MASTECTOMY Left 05/04/2019   Procedure: LEFT TOTAL MASTECTOMY;  Surgeon: Fanny Skates, MD;  Location: [place];  Service: General;  Laterality: Left;  . TRANSTHORACIC ECHOCARDIOGRAM  03-25-2009   MILD LVF/ EF 70-80%/ MILD INCREASE SYSTOLIC PULMONARY  PRESSURE  . TRANSURETHRAL RESECTION OF BLADDER TUMOR WITH GYRUS (TURBT-GYRUS) N/A 03/26/2013   Procedure: TRANSURETHRAL RESECTION OF BLADDER TUMOR WITH GYRUS (TURBT-GYRUS);  Surgeon:  Alexis Frock, MD;  Location: [facility];  Service: Urology;  Laterality: N/A;  . TRANSURETHRAL RESECTION OF BLADDER TUMOR WITH GYRUS (TURBT-GYRUS) N/A 05/13/2013   Procedure: TRANSURETHRAL RESECTION OF BLADDER TUMOR WITH GYRUS (TURBT-GYRUS)  RE-STAGING TRANSURETHRAL RESECTION OF BLADDER TUMOR, LEFT RETROGRADE PYELOGRAM AND STENT EXCHANGE;  Surgeon: Alexis Frock, MD;  Location: [facility];  Service: Urology;  Laterality: N/A;  . TRANSURETHRAL RESECTION OF BLADDER TUMOR WITH GYRUS (TURBT-GYRUS) N/A 08/11/2014   Procedure: TRANSURETHRAL RESECTION OF BLADDER TUMOR WITH GYRUS (TURBT-GYRUS);  Surgeon: Alexis Frock, MD;  Location: [facility];  Service: Urology;  Laterality: N/A;  . TUBAL LIGATION       Current Meds  Medication Sig  . albuterol (VENTOLIN HFA) 108 (90 Base) MCG/ACT inhaler Inhale 2 puffs into the lungs every 6 (six) hours as needed for wheezing or shortness of breath.  Marland Kitchen atorvastatin (LIPITOR) 10 MG tablet TAKE ONE TABLET  DAILY AT 6PM  . BROVANA 15 MCG/2ML NEBU USE 1 VIAL  IN  NEBULIZER TWICE  DAILY - (Morning and Evening)  . budesonide (PULMICORT) 0.5 MG/2ML nebulizer solution Take 2 mLs (0.5 mg total) by nebulization 2 (two) times daily.  . digoxin (LANOXIN) 0.125 MG tablet Take 1 tablet (125 mcg total) by mouth daily.  Marland Kitchen diltiazem (CARDIZEM CD) 240 MG 24 hr capsule TAKE ONE CAPSULE EACH DAY  . fluorometholone (FML) 0.1 % ophthalmic suspension Place 1 drop into both eyes daily.   . furosemide (LASIX) 20 MG tablet Take 1 tablet (20 mg total) by mouth daily.  Marland Kitchen ipratropium (ATROVENT) 0.02 % nebulizer solution Take 2.5 mLs (0.5 mg total) by nebulization 3 (three) times daily. DX: J44.9  . latanoprost (XALATAN) 0.005 % ophthalmic solution Place 1 drop into both eyes at bedtime. Reported on 11/09/2015  . OXYGEN Inhale 2 L into the lungs continuous.   . pantoprazole (PROTONIX) 40 MG tablet TAKE ONE TABLET DAILY  . SIMBRINZA 1-0.2 % SUSP  Place 1 drop into both eyes 2 (two) times daily.   . valACYclovir (VALTREX) 1000 MG tablet Take 1,000 mg by mouth daily.   . [DISCONTINUED] furosemide (LASIX) 20 MG tablet Take 1 tablet (20 mg total) by mouth daily. TAKE 40MG X3 DAYS THEN BACK TO 20MG     Allergies:   Heparin and Morphine and related   Social History   Tobacco Use  . Smoking status: Former Smoker    Packs/day: 1.00    Years: 40.00    Pack years: 40.00    Types: Cigarettes    Quit date: 03/20/2003    Years since quitting: 16.6  . Smokeless tobacco: Never Used  Substance Use Topics  . Alcohol use: No    Alcohol/week: 0.0 standard drinks  . Drug use: No     Family Hx: The patient's family history includes Breast cancer (age of onset: 57) in her maternal aunt; Breast cancer (age of onset: 80) in her daughter; Breast cancer (age of onset: 94) in an other family member; Cancer in her maternal aunt; Colon cancer in her maternal grandmother; Colon cancer (age of onset: 53) in her mother; Prostate cancer in her paternal grandfather; Stroke in her maternal grandmother and paternal grandmother. There is no history of Heart attack or Hypertension.  ROS:   Please see the history of present illness.     All other systems reviewed and are negative.   Labs/Other Tests and Data Reviewed:    Recent Labs: 04/08/2019: ALT 31 05/05/2019: Hemoglobin 13.0; Platelets 190 07/20/2019: Pro B Natriuretic peptide (BNP) 571.0 10/19/2019: BUN 19; Creatinine, Ser 0.86; Potassium 4.4; Sodium 145   Recent Lipid Panel Lab Results  Component Value Date/Time   CHOL 121 04/19/2017 09:36 AM   TRIG 114 04/19/2017 09:36 AM   HDL 46 04/19/2017 09:36 AM   CHOLHDL 2.6 04/19/2017 09:36 AM   CHOLHDL 3 05/09/2016 12:16 PM   LDLCALC 52 04/19/2017 09:36 AM    Wt Readings from Last 3 Encounters:  10/22/19 149 lb (67.6 kg)  10/14/19 155 lb (70.3 kg)  07/20/19 155 lb (70.3 kg)     Exam:    Vital Signs:  Wt 149 lb (67.6 kg)   LMP 12/03/2002   BMI  24.05 kg/m    Well nourished, well developed female in no  acute distress.   ASSESSMENT & PLAN:    1.  Acute on chronic diastolic CHF-much less edema today.  Weight today 149 pounds which is  down from 155 pounds 10/14/2019.  She is having a harder time getting around doing daily activities and demonstrates poor activity tolerance.  Renal function is stable. Continue digoxin 0.125 mg daily Resume furosemide 20 mg daily Stop potassium supplement Heart healthy low-sodium diet-salty 6 given Increase physical activity as tolerated Continue fluid restriction Lower extremity support stockings Continue Daily weights   Permanent atrial fibrillation-virtual visit today unable to obtain heart rate.  Not a candidate for anticoagulation due to intracranial hemorrhage on aspirin.  This required surgical evacuation.  Previously discussed possibility of short-term anticoagulation and placement of watchman device.  Neurosurgery felt this would be a possibility.  However, [name] did not want to go forward with this.  She has expressed understanding of higher risk of CVA. Continue Cardizem 240 daily Continue digoxin 0.25 mcg Avoid triggers caffeine, chocolate, EtOH, OSA, etc.  Essential hypertension-unable to obtain blood pressure today.  BP 10/14/19 117/64.   Continue diltiazem 240 mg daily Continue furosemide 20 mg daily Heart healthy low-sodium diet-salty 6 Increase physical activity as tolerated  Hyperlipidemia-  LDL 52 04/20/19 Continue atorvastatin 10 mg daily Heart healthy low-sodium high-fiber diet Increase physical activity as tolerated  COPD-  Continues on 2 L oxygen 10/22/19, oxygen dependent.  Had visit with pulmonology 10/13/2019 who prescribed  who prescribed antibiotics, prednisone, extra Lasix, Mucinex, and continuation of her nebulizers.   follow-up with Dr. Elsworth Soho 10/27/2019 Followed by pulmonary  COVID-19 Education: The signs and symptoms of COVID-19 were discussed with the  patient and how to seek care for testing (follow up with PCP or arrange E-visit).  The importance of social distancing was discussed today.  Patient Risk:   After full review of this patients clinical status, I feel that they are at least moderate risk at this time.  Time:   Today, I have spent 15 minutes with the patient with telehealth technology discussing lower extremity edema, low-sodium diet, exercise, furosemide, potassium, renal function, daily weights.     Medication Adjustments/Labs and Tests Ordered: Current medicines are reviewed at length with the patient today.  Concerns regarding medicines are outlined above.   Tests Ordered: No orders of the defined types were placed in this encounter.  Medication Changes: Meds ordered this encounter  Medications  . furosemide (LASIX) 20 MG tablet    Sig: Take 1 tablet (20 mg total) by mouth daily.    Dispense:  33 tablet    Refill:  3    Disposition:  in 3 month(s)  Signed, Jossie Ng. [facility] [place] [address] Office [phone] Fax [phone]

## 2019-10-22 ENCOUNTER — Telehealth (INDEPENDENT_AMBULATORY_CARE_PROVIDER_SITE_OTHER): Payer: Medicare Other | Admitting: General Practice

## 2019-10-22 ENCOUNTER — Ambulatory Visit: Payer: Medicare Other | Admitting: General Practice

## 2019-10-22 VITALS — Wt 149.0 lb

## 2019-10-22 DIAGNOSIS — I5042 Chronic combined systolic (congestive) and diastolic (congestive) heart failure: Secondary | ICD-10-CM

## 2019-10-22 DIAGNOSIS — J441 Chronic obstructive pulmonary disease with (acute) exacerbation: Secondary | ICD-10-CM

## 2019-10-22 DIAGNOSIS — I4891 Unspecified atrial fibrillation: Secondary | ICD-10-CM

## 2019-10-22 DIAGNOSIS — I10 Essential (primary) hypertension: Secondary | ICD-10-CM

## 2019-10-22 DIAGNOSIS — E7800 Pure hypercholesterolemia, unspecified: Secondary | ICD-10-CM

## 2019-10-22 MED ORDER — FUROSEMIDE 20 MG PO TABS: 20.0000 mg | ORAL_TABLET | Freq: Every day | ORAL | 3 refills | Status: DC

## 2019-10-22 NOTE — Patient Instructions (Addendum)
Special Instructions: CONTINUE TO TAKE AND LOG YOUR WEIGHT DAILY  INCREASE PHYSICAL ACTIVITY AS TOLERATED  PLEASE FOLLOW LOW SODIUM DIET ATTACHED  PLEASE READ AND FOLLOW SALTY 6 ATTACHED  Follow-Up: 3 months  In Person Susan Ruths, MD.  APPOINTMENT MADE FOR 01-19-2020 AT 21PM ARRIVE ABOUT 410PM ALONE AND WEARING A MASK  At [facility], you and your health needs are our priority.  As part of our continuing mission to provide you with exceptional heart care, we have created designated Provider Care Teams.  These Care Teams include your primary Cardiologist (physician) and Advanced Practice Providers (APPs -  Physician Assistants and Nurse Practitioners) who all work together to provide you with the care you need, when you need it  Reduce your risk of getting COVID-19 With your heart disease it is especially important for people at increased risk of severe illness from COVID-19, and those who live with them, to protect themselves from getting COVID-19. The best way to protect yourself and to help reduce the spread of the virus that causes COVID-19 is to: Marland Kitchen Limit your interactions with other people as much as possible. . Take precautions to prevent getting COVID-19 when you do interact with others. If you start feeling sick and think you may have COVID-19, get in touch with your healthcare provider within 24 hours.  Thank you for choosing [facility] at [facility]!!     Low-Sodium Eating Plan Sodium, which is an element that makes up salt, helps you maintain a healthy balance of fluids in your body. Too much sodium can increase your blood pressure and cause fluid and waste to be held in your body. Your health care provider or dietitian may recommend following this plan if you have high blood pressure (hypertension), kidney disease, liver disease, or heart failure. Eating less sodium can help lower your blood pressure, reduce swelling, and protect your heart, liver, and kidneys. What  are tips for following this plan? General guidelines  Most people on this plan should limit their sodium intake to 1,500-2,000 mg (milligrams) of sodium each day. Reading food labels   The Nutrition Facts label lists the amount of sodium in one serving of the food. If you eat more than one serving, you must multiply the listed amount of sodium by the number of servings.  Choose foods with less than 140 mg of sodium per serving.  Avoid foods with 300 mg of sodium or more per serving. Shopping  Look for lower-sodium products, often labeled as "low-sodium" or "no salt added."  Always check the sodium content even if foods are labeled as "unsalted" or "no salt added".  Buy fresh foods. ? Avoid canned foods and premade or frozen meals. ? Avoid canned, cured, or processed meats  Buy breads that have less than 80 mg of sodium per slice. Cooking  Eat more home-cooked food and less restaurant, buffet, and fast food.  Avoid adding salt when cooking. Use salt-free seasonings or herbs instead of table salt or sea salt. Check with your health care provider or pharmacist before using salt substitutes.  Cook with plant-based oils, such as canola, sunflower, or olive oil. Meal planning  When eating at a restaurant, ask that your food be prepared with less salt or no salt, if possible.  Avoid foods that contain MSG (monosodium glutamate). MSG is sometimes added to [place] food, bouillon, and some canned foods. What foods are recommended? The items listed may not be a complete list. Talk with your dietitian about what dietary  choices are best for you. Grains Low-sodium cereals, including oats, puffed wheat and rice, and shredded wheat. Low-sodium crackers. Unsalted rice. Unsalted pasta. Low-sodium bread. Whole-grain breads and whole-grain pasta. Vegetables Fresh or frozen vegetables. "No salt added" canned vegetables. "No salt added" tomato sauce and paste. Low-sodium or reduced-sodium tomato  and vegetable juice. Fruits Fresh, frozen, or canned fruit. Fruit juice. Meats and other protein foods Fresh or frozen (no salt added) meat, poultry, seafood, and fish. Low-sodium canned tuna and salmon. Unsalted nuts. Dried peas, beans, and lentils without added salt. Unsalted canned beans. Eggs. Unsalted nut butters. Dairy Milk. Soy milk. Cheese that is naturally low in sodium, such as ricotta cheese, fresh mozzarella, or Swiss cheese Low-sodium or reduced-sodium cheese. Cream cheese. Yogurt. Fats and oils Unsalted butter. Unsalted margarine with no trans fat. Vegetable oils such as canola or olive oils. Seasonings and other foods Fresh and dried herbs and spices. Salt-free seasonings. Low-sodium mustard and ketchup. Sodium-free salad dressing. Sodium-free light mayonnaise. Fresh or refrigerated horseradish. Lemon juice. Vinegar. Homemade, reduced-sodium, or low-sodium soups. Unsalted popcorn and pretzels. Low-salt or salt-free chips. What foods are not recommended? The items listed may not be a complete list. Talk with your dietitian about what dietary choices are best for you. Grains Instant hot cereals. Bread stuffing, pancake, and biscuit mixes. Croutons. Seasoned rice or pasta mixes. Noodle soup cups. Boxed or frozen macaroni and cheese. Regular salted crackers. Self-rising flour. Vegetables Sauerkraut, pickled vegetables, and relishes. Olives. [place] fries. Onion rings. Regular canned vegetables (not low-sodium or reduced-sodium). Regular canned tomato sauce and paste (not low-sodium or reduced-sodium). Regular tomato and vegetable juice (not low-sodium or reduced-sodium). Frozen vegetables in sauces. Meats and other protein foods Meat or fish that is salted, canned, smoked, spiced, or pickled. Bacon, ham, sausage, hotdogs, corned beef, chipped beef, packaged lunch meats, salt pork, jerky, pickled herring, anchovies, regular canned tuna, sardines, salted nuts. Dairy Processed cheese and  cheese spreads. Cheese curds. Blue cheese. Feta cheese. String cheese. Regular cottage cheese. Buttermilk. Canned milk. Fats and oils Salted butter. Regular margarine. Ghee. Bacon fat. Seasonings and other foods Onion salt, garlic salt, seasoned salt, table salt, and sea salt. Canned and packaged gravies. Worcestershire sauce. Tartar sauce. Barbecue sauce. Teriyaki sauce. Soy sauce, including reduced-sodium. Steak sauce. Fish sauce. Oyster sauce. Cocktail sauce. Horseradish that you find on the shelf. Regular ketchup and mustard. Meat flavorings and tenderizers. Bouillon cubes. Hot sauce and Tabasco sauce. Premade or packaged marinades. Premade or packaged taco seasonings. Relishes. Regular salad dressings. Salsa. Potato and tortilla chips. Corn chips and puffs. Salted popcorn and pretzels. Canned or dried soups. Pizza. Frozen entrees and pot pies. Summary  Eating less sodium can help lower your blood pressure, reduce swelling, and protect your heart, liver, and kidneys.  Most people on this plan should limit their sodium intake to 1,500-2,000 mg (milligrams) of sodium each day.  Canned, boxed, and frozen foods are high in sodium. Restaurant foods, fast foods, and pizza are also very high in sodium. You also get sodium by adding salt to food.  Try to cook at home, eat more fresh fruits and vegetables, and eat less fast food, canned, processed, or prepared foods. This information is not intended to replace advice given to you by your health care provider. Make sure you discuss any questions you have with your health care provider.

## 2019-10-27 ENCOUNTER — Other Ambulatory Visit: Payer: Self-pay

## 2019-10-27 ENCOUNTER — Ambulatory Visit (INDEPENDENT_AMBULATORY_CARE_PROVIDER_SITE_OTHER): Payer: Medicare Other | Admitting: Pulmonary Disease

## 2019-10-27 ENCOUNTER — Ambulatory Visit (INDEPENDENT_AMBULATORY_CARE_PROVIDER_SITE_OTHER): Payer: Medicare Other

## 2019-10-27 ENCOUNTER — Encounter: Payer: Self-pay | Admitting: Pulmonary Disease

## 2019-10-27 VITALS — BP 100/60 | HR 89 | Temp 97.7°F | Ht 66.0 in | Wt 153.2 lb

## 2019-10-27 DIAGNOSIS — J449 Chronic obstructive pulmonary disease, unspecified: Secondary | ICD-10-CM | POA: Diagnosis not present

## 2019-10-27 DIAGNOSIS — J9611 Chronic respiratory failure with hypoxia: Secondary | ICD-10-CM

## 2019-10-27 DIAGNOSIS — I739 Peripheral vascular disease, unspecified: Secondary | ICD-10-CM

## 2019-10-27 DIAGNOSIS — J439 Emphysema, unspecified: Secondary | ICD-10-CM | POA: Diagnosis not present

## 2019-10-27 NOTE — Assessment & Plan Note (Signed)
Obtain lower extremity Dopplers with ABI

## 2019-10-27 NOTE — Assessment & Plan Note (Signed)
Continue on 3 L oxygen.  I encouraged her to get a pulse oximeter but problem is this does not pick up on her hands

## 2019-10-27 NOTE — Assessment & Plan Note (Addendum)
Appears worse, but improved after recent steroid taper for exacerbation Continue Pulmicort and Brovana twice daily Decrease Atrovent nebs to twice daily to simplify her regimen, she can take at that time if needed if she is having a bad day  Chest x-ray today, she does not seem to require more antibiotics. Bronchospasm is resolved so I doubt she needs more prednisone  She is at high risk for hospital admission

## 2019-10-27 NOTE — Progress Notes (Signed)
Subjective:    Patient ID: [name], female    DOB: [birth year], 85 y.o.   MRN: [id number]  HPI  85 yo remote smoker for follow-up of COPD chronic respiratory failure on home O2 She smoked 40 pack years before she quit in 2004  PMH -atrial fibrillation not on anticoagulation due to prior history of intracranial hemorrhagein 2016. Bladder CA ,  Breast CA s/p lt total mastectomy Exacerbation 12/21/2017 and 08/22/2018   Televisit 10/13/2019 reviewed-given Omnicef and prednisone Accompanied by her daughter today, history was independently verified.  She is in a wheelchair.  She has downsized and moved into a assisted living, husband has Parkinson's but is still working as a Chief Executive Officer. Weight is stable between 156 to 153 pounds.  Takes Lasix as needed for pedal edema. Breathing is gotten worse, takes Pulmicort and Brovana twice daily and using Atrovent 3 times daily, Atrovent causes dizziness and she wonders if she can cut down  Oxygen saturation does not pick up on her fingers with a head probe sats 91% on arrival on 3 L oxygen and slowly improved to 96%  She reports poor circulation in her feet and hands  Chest x-ray was obtained today and personally reviewed-no significant effusions, cardiomegaly and mild hyperinflation, no new infiltrate  Significant tests/ events reviewed  Spirometry6/2019ratio 48, FEV1 37% and FVC 57% consistent with severe obstruction  Review of Systems Patient denies significant dyspnea,cough, hemoptysis,  chest pain, palpitations, pedal edema, orthopnea, paroxysmal nocturnal dyspnea, lightheadedness, nausea, vomiting, abdominal or  leg pains      Objective:   Physical Exam   Gen. Pleasant,, in no distress, normal affect, in wheelchair ENT - no pallor,icterus, no post nasal drip, class 2 airway Neck: No JVD, no thyromegaly, no carotid bruits Lungs: no use of accessory muscles, no dullness to percussion, decreased without rales or rhonchi    Cardiovascular: Rhythm regular, heart sounds  normal, no murmurs or gallops, no peripheral edema Abdomen: soft and non-tender, no hepatosplenomegaly, BS normal. Musculoskeletal: No deformities, no  Clubbing, cyanotic toes chronic per patient Neuro:  alert, non focal, mild tremors        Assessment & Plan:

## 2019-10-27 NOTE — Patient Instructions (Signed)
Chest x-ray today. Arterial ultrasound of both legs to check circulation.  Decrease Atrovent/ipratropium nebs to twice daily  COVID-19 Vaccine Information can be found at: [email or web address] For questions related to vaccine distribution or appointments, please email [email or web address] or call [phone].

## 2019-10-28 ENCOUNTER — Telehealth: Payer: Self-pay | Admitting: Pulmonary Disease

## 2019-10-28 DIAGNOSIS — I739 Peripheral vascular disease, unspecified: Secondary | ICD-10-CM

## 2019-10-28 NOTE — Telephone Encounter (Signed)
Dr. Elsworth Soho,  This patient was seen by you 2/23/ for peripheral arterial disease. The order that was placed. There are two options that come up based on the information given to the [facility] regarding the incorrect order.  Would the vascular ultrasound lower extremity arterial pseudoaneurysm injection be an option?  OR the vascular ultrasound lower extremity arterial pseudoaneurysm compression?  Both have been attached so you can see which I was talking about. If there is another option please let me know. Thanks.

## 2019-10-30 ENCOUNTER — Other Ambulatory Visit: Payer: Self-pay | Admitting: General Surgery

## 2019-10-30 ENCOUNTER — Ambulatory Visit: Payer: Medicare Other | Attending: Internal Medicine

## 2019-10-30 DIAGNOSIS — I739 Peripheral vascular disease, unspecified: Secondary | ICD-10-CM

## 2019-10-30 DIAGNOSIS — Z23 Encounter for immunization: Secondary | ICD-10-CM

## 2019-10-30 NOTE — Telephone Encounter (Signed)
Neither of these 2 orders. Needs -arterial ultrasound /duplex both lower extremities with ABI

## 2019-10-30 NOTE — Telephone Encounter (Signed)
Order placed with notation in comment section this needs to be done with ABI per Dr. Elsworth Soho.  Nothing further needed at this time.

## 2019-10-30 NOTE — Progress Notes (Signed)
   Covid-19 Vaccination Clinic  Name:  [name]    MRN: [id number] DOB: May 07, 1935  10/30/2019  [name] was observed post Covid-19 immunization for 15 minutes without incidence. She was provided with Vaccine Information Sheet and instruction to access the V-Safe system.   [name] was instructed to call 911 with any severe reactions post vaccine: Marland Kitchen Difficulty breathing  . Swelling of your face and throat  . A fast heartbeat  . A bad rash all over your body  . Dizziness and weakness    Immunizations Administered    Name Date Dose VIS Date Route   Pfizer COVID-19 Vaccine 10/30/2019  2:26 PM 0.3 mL 08/14/2019 Intramuscular   Manufacturer: [place]   Lot: [id number]   [place]: [address]-5

## 2019-11-02 NOTE — Progress Notes (Signed)
Done

## 2019-11-04 ENCOUNTER — Encounter (HOSPITAL_COMMUNITY): Payer: Medicare Other

## 2019-11-04 DIAGNOSIS — H401134 Primary open-angle glaucoma, bilateral, indeterminate stage: Secondary | ICD-10-CM | POA: Diagnosis not present

## 2019-11-04 DIAGNOSIS — B0052 Herpesviral keratitis: Secondary | ICD-10-CM | POA: Diagnosis not present

## 2019-11-04 DIAGNOSIS — H26491 Other secondary cataract, right eye: Secondary | ICD-10-CM | POA: Diagnosis not present

## 2019-11-05 ENCOUNTER — Ambulatory Visit (HOSPITAL_COMMUNITY): Admission: RE | Admit: 2019-11-05 | Payer: Medicare Other | Source: Ambulatory Visit

## 2019-11-16 ENCOUNTER — Other Ambulatory Visit: Payer: Self-pay | Admitting: Internal Medicine

## 2019-11-24 ENCOUNTER — Ambulatory Visit (INDEPENDENT_AMBULATORY_CARE_PROVIDER_SITE_OTHER): Payer: Medicare Other | Admitting: Adult Health

## 2019-11-24 ENCOUNTER — Other Ambulatory Visit: Payer: Self-pay

## 2019-11-24 ENCOUNTER — Encounter: Payer: Self-pay | Admitting: Adult Health

## 2019-11-24 DIAGNOSIS — I5042 Chronic combined systolic (congestive) and diastolic (congestive) heart failure: Secondary | ICD-10-CM

## 2019-11-24 DIAGNOSIS — J449 Chronic obstructive pulmonary disease, unspecified: Secondary | ICD-10-CM

## 2019-11-24 DIAGNOSIS — I739 Peripheral vascular disease, unspecified: Secondary | ICD-10-CM

## 2019-11-24 NOTE — Progress Notes (Signed)
_0  ID: [name], female    DOB: 1935-01-18, 85 y.o.   MRN: [id number]  Chief Complaint  Patient presents with  . Follow-up    COPD     Referring provider: Janith Lima, MD  HPI: 86 year old female former smoker followed for COPD Medical history significant for A. fib and diastolic heart failure  TEST/EVENTS :  Spirometry6/2019ratio 48, FEV1 37% and FVC 57% consistent with severe obstruction  11/24/2019 Follow up : COPD , Diastolic CHF , O2 RF  Patient returns for a 1 month follow-up.  Patient was seen last visit for COPD exacerbation.  She was treated with [facility] for 1 week and prednisone 20 mg for 3 days.  She was also given Lasix 20 mg daily .   Since last visit patient says she is feeling some better with decreased cough and congestion.  She remains short of breath with heavy activity.  She is somewhat sedentary and gets winded easily. She remains on Pulmicort and Brovana nebulizer twice daily.  She is on ipratropium nebulizer 2 times daily.. She remains on Oxygen 2.5 L.  Patient has diastolic dysfunction.  Has chronic lower extremity swelling.  Says that it goes up and down.  Last visit patient was set up for arterial Dopplers.  However she declined to go.  Patient says she does not have any pain in her legs.  She is on Lasix 20 mg daily.  cvoid vaccine #1 , #2 tomorrow.    Allergies  Allergen Reactions  . Heparin     Previous history of hemorrhagic stroke.  Marland Kitchen Morphine And Related Nausea And Vomiting    Immunization History  Administered Date(s) Administered  . Fluad Quad(high Dose 65+) 06/09/2019  . Influenza, High Dose Seasonal PF 06/08/2016, 05/13/2017, 06/10/2018  . Influenza-Unspecified 07/05/2015  . PFIZER SARS-COV-2 Vaccination 10/30/2019  . Pneumococcal Conjugate-13 05/20/2014  . Pneumococcal Polysaccharide-23 10/09/2011, 06/17/2017  . Tdap 09/03/2005, 11/09/2015  . Zoster 09/04/2007  . Zoster Recombinat (Shingrix) 04/20/2019    Past  Medical History:  Diagnosis Date  . Arthritis   . COPD (chronic obstructive pulmonary disease) ([place])   . Dyspnea on exertion   . Family history of breast cancer   . Family history of colon cancer   . Family history of ovarian cancer   . Family history of prostate cancer   . GERD (gastroesophageal reflux disease)   . Glaucoma of both eyes   . History of atrial fibrillation without current medication 2010   POST SURG 2010--  PER PT NO ISSUES SINCE  . History of breast cancer    DX DCIS IN 2004--  S/P RIGHT MASTECTOMY , NO CHEMORADIATION---  NO RECURRENCE  . History of CHF (congestive heart failure)    POST SURG 2010  . History of hypertension   . History of shingles    02/ 2015--  back of neck and left flank--  no residual pain  . ICH (intracerebral hemorrhage) ([place]) 02/05/2015  . Nephrolithiasis   . 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) first dx 06/ 2014   s/p  turbt's  and instillation mitomycin c (chemo)//dx again in 2015-different type  . Stroke [facility])     Tobacco History: Social History   Tobacco Use  Smoking Status Former Smoker  . Packs/day: 1.00  . Years: 40.00  . Pack years: 40.00  . Types: Cigarettes  . Quit date: 03/20/2003  . Years since quitting: 16.6  Smokeless Tobacco Never Used   Counseling given: Not Answered  Outpatient Medications Prior to Visit  Medication Sig Dispense Refill  . albuterol (VENTOLIN HFA) 108 (90 Base) MCG/ACT inhaler Inhale 2 puffs into the lungs every 6 (six) hours as needed for wheezing or shortness of breath. 8 g 2  . atorvastatin (LIPITOR) 10 MG tablet TAKE ONE TABLET DAILY AT 6PM 90 tablet 0  . BROVANA 15 MCG/2ML NEBU USE 1 VIAL  IN  NEBULIZER TWICE  DAILY - (Morning and Evening) 60 mL 5  . budesonide (PULMICORT) 0.5 MG/2ML nebulizer solution Take 2 mLs (0.5 mg total) by nebulization 2 (two) times daily. 120 mL 5  . digoxin (LANOXIN) 0.125 MG tablet Take 1 tablet (125 mcg total) by mouth daily. 90 tablet 0  . diltiazem  (CARDIZEM CD) 240 MG 24 hr capsule TAKE ONE CAPSULE EACH DAY 90 capsule 0  . fluorometholone (FML) 0.1 % ophthalmic suspension Place 1 drop into both eyes daily.     . furosemide (LASIX) 20 MG tablet Take 1 tablet (20 mg total) by mouth daily. 33 tablet 3  . guaiFENesin (MUCINEX) 600 MG 12 hr tablet Take 600 mg by mouth daily.    Marland Kitchen ipratropium (ATROVENT) 0.02 % nebulizer solution Take 2.5 mLs (0.5 mg total) by nebulization 3 (three) times daily. DX: J44.9 225 mL 5  . latanoprost (XALATAN) 0.005 % ophthalmic solution Place 1 drop into both eyes at bedtime. Reported on 11/09/2015    . OXYGEN Inhale 2 L into the lungs continuous.     . pantoprazole (PROTONIX) 40 MG tablet TAKE ONE TABLET DAILY 90 tablet 1  . SHINGRIX injection     . SIMBRINZA 1-0.2 % SUSP Place 1 drop into both eyes 2 (two) times daily.   99  . valACYclovir (VALTREX) 1000 MG tablet Take 1,000 mg by mouth daily.      No facility-administered medications prior to visit.     Review of Systems:   Constitutional:   No  weight loss, night sweats,  Fevers, chills,  +fatigue, or  lassitude.  HEENT:   No headaches,  Difficulty swallowing,  Tooth/dental problems, or  Sore throat,                No sneezing, itching, ear ache, nasal congestion, post nasal drip,   CV:  No chest pain,  Orthopnea, PND,   anasarca, dizziness, palpitations, syncope.   GI  No heartburn, indigestion, abdominal pain, nausea, vomiting, diarrhea, change in bowel habits, loss of appetite, bloody stools.   Resp:   No chest wall deformity  Skin: no rash or lesions.  GU: no dysuria, change in color of urine, no urgency or frequency.  No flank pain, no hematuria   MS:  No joint pain or swelling.  No decreased range of motion.  No back pain.    Physical Exam  BP 104/60 (BP Location: Left Arm, Patient Position: Sitting, Cuff Size: Normal)   Pulse 77   Temp (!) 97.1 F (36.2 C) (Temporal)   Ht _0  (1.676 m)   Wt 155 lb 6.4 oz (70.5 kg)   LMP 12/03/2002    SpO2 96% Comment: 02 2l/min  BMI 25.08 kg/m   GEN: A/Ox3; pleasant , NAD, elderly on oxygen and wheelchair   HEENT:  [place]/AT,   NOSE-clear, THROAT-clear, no lesions, no postnasal drip or exudate noted.   NECK:  Supple w/ fair ROM; no JVD; normal carotid impulses w/o bruits; no thyromegaly or nodules palpated; no lymphadenopathy.    RESP  Clear  P & A; w/o,  wheezes/ rales/ or rhonchi. no accessory muscle use, no dullness to percussion  CARD:  RRR, no m/r/g, 1-2+ edema peripheral edema, pulses intact, no cyanosis or clubbing.. Stasis dermatitis changes  GI:   Soft & nt; nml bowel sounds; no organomegaly or masses detected.   Musco: Warm bil, no deformities or joint swelling noted.   Neuro: alert, no focal deficits noted.    Skin: Warm, no lesions or rashes    Lab Results:  CBC    Component Value Date/Time   WBC 6.3 05/05/2019 0151   RBC 3.93 05/05/2019 0151   HGB 13.0 05/05/2019 0151   HGB 13.9 04/08/2019 1239   HCT 40.3 05/05/2019 0151   PLT 190 05/05/2019 0151   PLT 223 04/08/2019 1239   MCV 102.5 (H) 05/05/2019 0151   MCH 33.1 05/05/2019 0151   MCHC 32.3 05/05/2019 0151   RDW 14.3 05/05/2019 0151   LYMPHSABS 1.7 04/08/2019 1239   MONOABS 0.8 04/08/2019 1239   EOSABS 0.1 04/08/2019 1239   BASOSABS 0.1 04/08/2019 1239    BMET    Component Value Date/Time   NA 145 (H) 10/19/2019 1221   K 4.4 10/19/2019 1221   CL 100 10/19/2019 1221   CO2 29 10/19/2019 1221   GLUCOSE 68 10/19/2019 1221   GLUCOSE 83 07/20/2019 1532   BUN 19 10/19/2019 1221   CREATININE 0.86 10/19/2019 1221   CREATININE 0.83 04/08/2019 1239   CREATININE 0.69 07/11/2015 0910   CALCIUM 9.3 10/19/2019 1221   GFRNONAA 63 10/19/2019 1221   GFRNONAA >60 04/08/2019 1239   GFRAA 73 10/19/2019 1221   GFRAA >60 04/08/2019 1239    BNP    Component Value Date/Time   BNP 506.5 (H) 12/27/2017 0931    ProBNP    Component Value Date/Time   PROBNP 571.0 (H) 07/20/2019 1532    Imaging: DG  Chest 2 View  Result Date: 10/27/2019 CLINICAL DATA:  85 year old female with COPD EXAM: CHEST - 2 VIEW COMPARISON:  Chest radiograph dated 07/20/2019 FINDINGS: There is emphysematous changes of the lungs with diffuse interstitial coarsening. Small bilateral pleural effusions may be present. Bibasilar densities may represent atelectasis/scarring although infiltrate is not excluded. No pneumothorax. Mild cardiomegaly. Atherosclerotic calcification of the aorta. No acute osseous pathology. IMPRESSION: 1. Probable small bilateral pleural effusions and bibasilar subsegmental atelectasis. Pneumonia is not excluded. 2. Aortic Atherosclerosis (ICD10-I70.0) and Emphysema (ICD10-J43.9). Electronically Signed   By: Anner Crete M.D.   On: 10/27/2019 17:22      No flowsheet data found.  No results found for: NITRICOXIDE      Assessment & Plan:   COPD (chronic obstructive pulmonary disease) (HCC) Recent exacerbation seems to be improving now.  She is back to her baseline continue on present regimen  Plan  Patient Instructions  Call back if you change mind on arterial dopplers. Continue on Pulmicort and Brovana Neb Twice daily  .  Increase Ipratropium Neb 2  times a day  .  Mucinex DM Twice daily  As needed  Cough /congestion  Continue on Oxygen 2 l/m .  Legs elevated.  Low salt diet .  Follow up in 3- months with Dr. Elsworth Soho  and As needed   Please contact office for sooner follow up if symptoms do not improve or worsen or seek emergency care         Chronic combined systolic and diastolic congestive heart failure ([place]) Appears compensated patient is advised on a low-salt diet.  Keep legs elevated. Continue on current diuretics  Plan  Patient Instructions  Call back if you change mind on arterial dopplers. Continue on Pulmicort and Brovana Neb Twice daily  .  Increase Ipratropium Neb 2  times a day  .  Mucinex DM Twice daily  As needed  Cough /congestion  Continue on Oxygen 2 l/m  .  Legs elevated.  Low salt diet .  Follow up in 3- months with Dr. Elsworth Soho  and As needed   Please contact office for sooner follow up if symptoms do not improve or worsen or seek emergency care         Peripheral arterial disease [facility]) Patient has chronic leg swelling stasis dermatitis changes.  And has risk factors for peripheral artery disease.  Agree with arterial Dopplers patient declines at this time.  Patient education given.     Rexene Edison, NP 11/24/2019

## 2019-11-24 NOTE — Assessment & Plan Note (Signed)
Patient has chronic leg swelling stasis dermatitis changes.  And has risk factors for peripheral artery disease.  Agree with arterial Dopplers patient declines at this time.  Patient education given.

## 2019-11-24 NOTE — Assessment & Plan Note (Signed)
Recent exacerbation seems to be improving now.  She is back to her baseline continue on present regimen  Plan  Patient Instructions  Call back if you change mind on arterial dopplers. Continue on Pulmicort and Brovana Neb Twice daily  .  Increase Ipratropium Neb 2  times a day  .  Mucinex DM Twice daily  As needed  Cough /congestion  Continue on Oxygen 2 l/m .  Legs elevated.  Low salt diet .  Follow up in 3- months with Dr. Elsworth Soho  and As needed   Please contact office for sooner follow up if symptoms do not improve or worsen or seek emergency care

## 2019-11-24 NOTE — Patient Instructions (Addendum)
Call back if you change mind on arterial dopplers. Continue on Pulmicort and Brovana Neb Twice daily  .  Increase Ipratropium Neb 2  times a day  .  Mucinex DM Twice daily  As needed  Cough /congestion  Continue on Oxygen 2 l/m .  Legs elevated.  Low salt diet .  Follow up in 3- months with Dr. Elsworth Soho  and As needed   Please contact office for sooner follow up if symptoms do not improve or worsen or seek emergency care

## 2019-11-24 NOTE — Assessment & Plan Note (Signed)
Appears compensated patient is advised on a low-salt diet.  Keep legs elevated. Continue on current diuretics  Plan  Patient Instructions  Call back if you change mind on arterial dopplers. Continue on Pulmicort and Brovana Neb Twice daily  .  Increase Ipratropium Neb 2  times a day  .  Mucinex DM Twice daily  As needed  Cough /congestion  Continue on Oxygen 2 l/m .  Legs elevated.  Low salt diet .  Follow up in 3- months with Dr. Elsworth Soho  and As needed   Please contact office for sooner follow up if symptoms do not improve or worsen or seek emergency care

## 2019-11-25 ENCOUNTER — Ambulatory Visit: Payer: Medicare Other | Attending: Internal Medicine

## 2019-11-25 DIAGNOSIS — Z23 Encounter for immunization: Secondary | ICD-10-CM

## 2019-11-25 NOTE — Progress Notes (Signed)
Covid-19 Vaccination Clinic  Name:  [name]    MRN: [id number] DOB: 01-Nov-1934  11/25/2019  [name] was observed post Covid-19 immunization for 15 minutes without incident. She was provided with Vaccine Information Sheet and instruction to access the V-Safe system.   [name] was instructed to call 911 with any severe reactions post vaccine: Marland Kitchen Difficulty breathing  . Swelling of face and throat  . A fast heartbeat  . A bad rash all over body  . Dizziness and weakness   Immunizations Administered    Name Date Dose VIS Date Route   Pfizer COVID-19 Vaccine 11/25/2019  2:07 PM 0.3 mL 08/14/2019 Intramuscular   Manufacturer: [place]   Lot: [id number]   [place]: [id number]

## 2019-11-30 ENCOUNTER — Other Ambulatory Visit: Payer: Self-pay | Admitting: Internal Medicine

## 2019-11-30 DIAGNOSIS — J418 Mixed simple and mucopurulent chronic bronchitis: Secondary | ICD-10-CM

## 2019-12-08 ENCOUNTER — Telehealth: Payer: Self-pay | Admitting: Cardiology

## 2019-12-08 ENCOUNTER — Encounter: Payer: Self-pay | Admitting: Student

## 2019-12-08 ENCOUNTER — Telehealth: Payer: Self-pay | Admitting: Pulmonary Disease

## 2019-12-08 NOTE — Telephone Encounter (Signed)
Spoke with pt. She is aware of Tammy's recommendations. Nothing further needed.

## 2019-12-08 NOTE — Progress Notes (Signed)
Cardiology Office Note:    Date:  12/10/2019   ID:  [name], DOB 1935/03/01, MRN [id number]  PCP:  Janith Lima, MD  Cardiologist:  Kirk Ruths, MD  Electrophysiologist:  None   Referring MD: Janith Lima, MD   Chief Complaint: lower extremity edema   History of Present Illness:    [name] is a 85 y.o. female with a history of chronic atrial fibrillation not on anticoagulation, chronic diastolic CHF, intracerebral hemorrhage in 2016, COPD, hypertension, GERD, and breast cancer who is followed by Dr. Stanford Breed and presents today for evaluation of lower extremity edema.  Patient was admitted in 02/2015 with a large right cerebral hemorrhage which required surgical intervention/evacuation.  Her only anticoagulant at that time was Aspirin. She developed atrial fibrillation with RVR during this hospitalization with difficult to control rates. Echo showed LVEF of 65-70% with normal wall motions, mild LVH, and grade 1 diastolic dysfunction. She was not felt to be a candidate for anticoagulation given her intracranial hemorrhage. A Holter Monitor in 03/2015 showed atiral fibrillation with elevated rates. Digoxin was added. Dr. Stanford Breed contacted Neurosurgery to see if she would be a candidate for short-term anticoagulation which might qualify her for a Watchman Device. Neurosurgery felt this could be pursued. However, patient declined Watchman Device and prefers no anticoagulation. During telehealth visit in 12/2018, she described unilateral left lower extremity edema and erythema. Doppler negative for DVT. Felt to be due to venous insufficiency. She was advised to elevate her feet at night and consider wearing compression stockings. She was seen by Coletta Memos, NP, on 10/14/2019 and reported increased work of breathing over the last week as well as increased lower extremity edema. She saw Pulmonology the day before and they prescribed her antibiotics and Prednisone for her chronic  COPD. Lasix was increased to 53m for 3 days with instructions to then return to 284mdaily. She was seen for a virtual visit one week later and reported improvement in both breathing and lower extremity edema.   Patient called our office on 12/08/2019 with reports on bilateral lower extremity edema and today's visit was scheduled for further evaluation. Here today with daughter. She notes progressive worsening of lower extremity edema, abdominal distension, and shortness of breath for the last couple of weeks. She is visibly short of breath in the office with tachypnea and increased work of breathing. She is currently on 3L of her home O2. Normally between 2-2.5 L. She does have chronic shortness of breath with COPD but this is worse than usual. Daughter does state that it is worse when she moves from one placed to another but improves some at rest and she does better with her O2 machine at home compared to her travel machine. Patient denies any clear orthopnea but daughter states she thinks she sleep better a recline. Unclear if she has had any PND. She is up 5 lbs since office visit on 10/14/2019. She does note some early satiety but denies any nausea or vomiting. No chest pain. She notes a little bit of dizziness lately but denies any palpitations or syncope.   It was difficult to obtain O2 sats in the office and patient states she also has trouble getting the finger pulse ox to work. We were finally able to get a reading of 88% but unclear if this is accurate.   Past Medical History:  Diagnosis Date  . Arthritis   . COPD (chronic obstructive pulmonary disease) ([place]  . Dyspnea  on exertion   . GERD (gastroesophageal reflux disease)   . Glaucoma of both eyes   . History of atrial fibrillation without current medication 2010   POST SURG 2010--  PER PT NO ISSUES SINCE  . History of breast cancer    DX DCIS IN 2004--  S/P RIGHT MASTECTOMY , NO CHEMORADIATION---  NO RECURRENCE  . History of CHF  (congestive heart failure)    POST SURG 2010  . History of hypertension   . History of shingles    02/ 2015--  back of neck and left flank--  no residual pain  . ICH (intracerebral hemorrhage) ([place]) 02/05/2015  . Nephrolithiasis   . Recurrent bladder papillary carcinoma ([place]) first dx 06/ 2014   s/p  turbt's  and instillation mitomycin c (chemo)//dx again in 2015-different type  . Stroke [facility])     Past Surgical History:  Procedure Laterality Date  . CATARACT EXTRACTION W/ INTRAOCULAR LENS  IMPLANT, BILATERAL    . CRANIECTOMY N/A 02/05/2015   Procedure: SUBOCCIPITAL CRANIECTOMY EVACUATION OF HEMATOMA;  Surgeon: Consuella Lose, MD;  Location: [facility];  Service: Neurosurgery;  Laterality: N/A;  . CYSTOSCOPY W/ RETROGRADES Bilateral 08/11/2014   Procedure: CYSTOSCOPY WITH BILATERAL RETROGRADE PYELOGRAM AND MITOMYCIN INSTILLATION;  Surgeon: Alexis Frock, MD;  Location: [facility];  Service: Urology;  Laterality: Bilateral;  . CYSTOSCOPY W/ URETERAL STENT PLACEMENT Bilateral 03/26/2013   Procedure: CYSTOSCOPY WITH BILATERAL RETROGRADE PYELOGRAM/URETERAL STENT PLACEMENT;  Surgeon: Alexis Frock, MD;  Location[facility];  Service: Urology;  Laterality: Bilateral;  . CYSTOSCOPY W/ URETERAL STENT PLACEMENT Left 05/13/2013   Procedure: CYSTOSCOPY WITH RETROGRADE PYELOGRAM/URETERAL STENT PLACEMENT STENT EXCHANGE;  Surgeon: Alexis Frock, MD;  Location[facility];  Service: Urology;  Laterality: Left;  . PARTIAL MASTECTOMY WITH NEEDLE LOCALIZATION Right 01-14-2003   DCIS  . REMOVAL CYST LEFT HAND  2013  . TOTAL KNEE ARTHROPLASTY Right 03-22-2009  . TOTAL MASTECTOMY Right 02-03-2003   W/ SLN BX  AND  POST 03-01-2003 EVACUATION HEMATOMA  . TOTAL MASTECTOMY Left 05/04/2019   Procedure: LEFT TOTAL MASTECTOMY;  Surgeon: Fanny Skates, MD;  Location: [facility];  Service: General;  Laterality: Left;  . TRANSTHORACIC ECHOCARDIOGRAM  03-25-2009   MILD  LVF/ EF 70-80%/ MILD INCREASE SYSTOLIC PULMONARY  PRESSURE  . TRANSURETHRAL RESECTION OF BLADDER TUMOR WITH GYRUS (TURBT-GYRUS) N/A 03/26/2013   Procedure: TRANSURETHRAL RESECTION OF BLADDER TUMOR WITH GYRUS (TURBT-GYRUS);  Surgeon: Alexis Frock, MD;  Location: [facility];  Service: Urology;  Laterality: N/A;  . TRANSURETHRAL RESECTION OF BLADDER TUMOR WITH GYRUS (TURBT-GYRUS) N/A 05/13/2013   Procedure: TRANSURETHRAL RESECTION OF BLADDER TUMOR WITH GYRUS (TURBT-GYRUS)  RE-STAGING TRANSURETHRAL RESECTION OF BLADDER TUMOR, LEFT RETROGRADE PYELOGRAM AND STENT EXCHANGE;  Surgeon: Alexis Frock, MD;  Location: [facility]  Service: Urology;  Laterality: N/A;  . TRANSURETHRAL RESECTION OF BLADDER TUMOR WITH GYRUS (TURBT-GYRUS) N/A 08/11/2014   Procedure: TRANSURETHRAL RESECTION OF BLADDER TUMOR WITH GYRUS (TURBT-GYRUS);  Surgeon: Alexis Frock, MD;  Location: [facility];  Service: Urology;  Laterality: N/A;  . TUBAL LIGATION      Current Medications: Current Meds  Medication Sig  . albuterol (VENTOLIN HFA) 108 (90 Base) MCG/ACT inhaler Inhale 2 puffs into the lungs every 6 (six) hours as needed for wheezing or shortness of breath.  Marland Kitchen atorvastatin (LIPITOR) 10 MG tablet TAKE ONE TABLET DAILY AT 6PM  . BROVANA 15 MCG/2ML NEBU USE 1 VIAL  IN  NEBULIZER TWICE  DAILY - (Morning and Evening)  . budesonide (PULMICORT) 0.5 MG/2ML nebulizer solution 2ML BY NEBULIZER TWICE DAILY  . digoxin (LANOXIN) 0.125 MG tablet Take 1 tablet (125 mcg total) by mouth daily.  Marland Kitchen diltiazem (CARDIZEM CD) 240 MG 24 hr capsule TAKE ONE CAPSULE EACH DAY  . fluorometholone (FML) 0.1 % ophthalmic suspension Place 1 drop into both eyes daily.   Marland Kitchen guaiFENesin (MUCINEX) 600 MG 12 hr tablet Take 600 mg by mouth daily.  Marland Kitchen ipratropium (ATROVENT) 0.02 % nebulizer solution Take 2.5 mLs (0.5 mg total) by nebulization 3 (three) times daily. DX: J44.9  . latanoprost (XALATAN) 0.005 % ophthalmic  solution Place 1 drop into both eyes at bedtime. Reported on 11/09/2015  . OXYGEN Inhale 2 L into the lungs continuous.   . pantoprazole (PROTONIX) 40 MG tablet TAKE ONE TABLET DAILY  . SHINGRIX injection   . SIMBRINZA 1-0.2 % SUSP Place 1 drop into both eyes 2 (two) times daily.   . valACYclovir (VALTREX) 1000 MG tablet Take 1,000 mg by mouth daily.   . [DISCONTINUED] furosemide (LASIX) 20 MG tablet Take 1 tablet (20 mg total) by mouth daily.     Allergies:   Heparin and Morphine and related   Social History   Socioeconomic History  . Marital status: Married    Spouse name: Not on file  . Number of children: 4  . Years of education: Not on file  . Highest education level: Not on file  Occupational History  . Not on file  Tobacco Use  . Smoking status: Former Smoker    Packs/day: 1.00    Years: 40.00    Pack years: 40.00    Types: Cigarettes    Quit date: 03/20/2003    Years since quitting: 16.7  . Smokeless tobacco: Never Used  Substance and Sexual Activity  . Alcohol use: No    Alcohol/week: 0.0 standard drinks  . Drug use: No  . Sexual activity: Not Currently    Partners: Male    Birth control/protection: Surgical    Comment: BTL  Other Topics Concern  . Not on file  Social History Narrative  . Not on file   Social Determinants of Health   Financial Resource Strain:   . Difficulty of Paying Living Expenses:   Food Insecurity:   . Worried About Charity fundraiser in the Last Year:   . Arboriculturist in the Last Year:   Transportation Needs:   . Film/video editor (Medical):   Marland Kitchen Lack of Transportation (Non-Medical):   Physical Activity:   . Days of Exercise per Week:   . Minutes of Exercise per Session:   Stress:   . Feeling of Stress :   Social Connections:   . Frequency of Communication with Friends and Family:   . Frequency of Social Gatherings with Friends and Family:   . Attends Religious Services:   . Active Member of Clubs or Organizations:     . Attends Archivist Meetings:   Marland Kitchen Marital Status:      Family History: The patient's family history includes Breast cancer (age of onset: 90) in her maternal aunt; Breast cancer (age of onset: 67) in her daughter; Breast cancer (age of onset: 1) in an other family member; Cancer in her maternal aunt; Colon cancer in her maternal grandmother; Colon cancer (age of onset: 24) in her mother; Prostate cancer in her paternal grandfather; Stroke in her maternal grandmother and paternal grandmother. There is no  history of Heart attack or Hypertension.  ROS:   Please see the history of present illness.      EKGs/Labs/Other Studies Reviewed:    The following studies were reviewed today:  Echocardiogram 02/07/2015: Study Conclusions: - Procedure narrative: Transthoracic echocardiography. Image  quality was poor. The study was technically difficult, as a  result of poor acoustic windows, poor sound wave transmission,  and restricted patient mobility.  - Left ventricle: The cavity size was normal. Wall thickness was  increased in a pattern of mild LVH. Systolic function was  vigorous. The estimated ejection fraction was in the range of 65%  to 70%. Wall motion was normal; there were no regional wall  motion abnormalities. Doppler parameters are consistent with  abnormal left ventricular relaxation (grade 1 diastolic  dysfunction). The E/e&' ratio is between 8-15, suggesting  indeterminate LV filling pressure.  - Aortic valve: Poorly visualized. There was no stenosis. There was  no regurgitation.  - Mitral valve: Calcified annulus. There was trivial regurgitation.  - Left atrium: The atrium was normal in size.  - Right atrium: The atrium was mildly dilated.   Impressions: - Although difficult to compare, there do not appear to be  significant changes compared to the echo in 2010. _______________  Holter Monitor 03/2015: Atrial fibrillation. Rates  elevated.  _______________  Left Lower Extremity Ultrasound 01/02/2019: Summary:  - Right: No evidence of common femoral vein obstruction.  - Left: No evidence of deep vein thrombosis in the lower extremity. No  indirect evidence of obstruction proximal to the inguinal ligament. A  cystic structure is found in the popliteal fossa, measuring 3.9 x .60 s  2.0 cm. Inhomogeneous enlarged lymph node  adjacent to the proximal GSV in the groin/proximal thigh area, measuring  1.8 x 1.2 cm.   EKG:  EKG not ordered today.   Recent Labs: 04/08/2019: ALT 31 05/05/2019: Hemoglobin 13.0; Platelets 190 07/20/2019: Pro B Natriuretic peptide (BNP) 571.0 10/19/2019: BUN 19; Creatinine, Ser 0.86; Potassium 4.4; Sodium 145  Recent Lipid Panel    Component Value Date/Time   CHOL 121 04/19/2017 0936   TRIG 114 04/19/2017 0936   HDL 46 04/19/2017 0936   CHOLHDL 2.6 04/19/2017 0936   CHOLHDL 3 05/09/2016 1216   VLDL 36.8 05/09/2016 1216   LDLCALC 52 04/19/2017 0936    Physical Exam:    Vital Signs: BP (!) 96/54   Pulse 75   Ht _0  (1.676 m)   Wt 160 lb 3.2 oz (72.7 kg)   LMP 12/03/2002   BMI 25.86 kg/m     Wt Readings from Last 3 Encounters:  12/10/19 160 lb 3.2 oz (72.7 kg)  11/24/19 155 lb 6.4 oz (70.5 kg)  10/27/19 153 lb 3.2 oz (69.5 kg)     General: 85 y.o. female with increased work of breathing.  HEENT: Normocephalic and atraumatic. Sclera clear. EOMs intact. Neck: Supple. No carotid bruits. No JVD appreciated with patient sitting upright. Heart: Irregularly irregular with normal rate. Distinct S1 and S2. II/VI systolic murmur best heard at the apex. No rubs or gallops appreciated. Radial pulses 2+ and equal bilaterally. Distal pedal pulses 1+ and equal bilaterally.   Lungs: Tachypneic with increased work of breathing. However, lungs clear to ausculation bilaterally. No wheezes, rhonchi, or rales.  Abdomen: Soft, mildly distended in lower abdomen, and non-tender to palpation.   Extremities: 2-3+ pitting edema of bilateral lower extremities up to knees.  Skin: Warm and dry. Discoloration of distal third of bilateral feet. Distal  pulses 1+ bilaterally. Please see photo below.  Neuro: No focal deficits. Psych: Normal affect. Responds appropriately.       Assessment:    1. Acute on chronic diastolic CHF (congestive heart failure) ([place])   2. COPD with hypoxia ([place])   3. Murmur   4. Chronic atrial fibrillation (HCC)   5. Essential hypertension     Plan:    Acute on Chronic Diastolic  - Patient notes worsening shortness of breath, lower extremity edema, and abdominal distension over the last 2 weeks. She is visibly short of breath in the office today and has significant lower extremity edema. However, lungs actually clear on exam. SpO2 88% on home O2 but unsure if this is accurate given it was very difficult to check pulse ox on her finger.  - Most recent Echo from 02/2015 showed LVEF of 65-70% with normal wall motion and grade 1 diastolic dysfunction.  - Discussed with DOD (Dr. Audie Box) - Given tachypnea and increased work of breathing in the office and soft BP that may make aggressive diuresis difficult, initially recommended patient go to the ED for further evaluation/management. However, patient did not want to do this. Therefore, will try to manage as outpatient for now. Will check BNP, CBC, BMET. And will get chest x-ray and Echo. BP is soft and patient has noted some dizziness, so will only increase Lasix from 20m to 473mdaily (she did seem to respond to this well a couple of months ago). Will also add K-Dur 1080mdaily with increased dose. Will have patient return for close follow-up visit on Monday 12/14/2019 and can recheck BMET at that time. Emphasized that if breathing worsens at call or shortness of breath/edema does not responds to increased Lasix, she needs to go to the ED prior to this. Patient and daughter voiced understanding and agreed.   COPD with  Hypoxia  - Patient does have COPD and is on 2L of nasal cannula at baseline at home. She has had to increase O2 to 3L occasionally recently.  - No wheezing on exam.  - Continue inhalers.   Murmur - Systolic murmur noted on exam.  - Will evaluated with Echo as above.   Chronic Atrial Fibrillation - Rates well controlled today.  - Continue Cardizem 240m66mily and Digoxin 0.125mg94mly.  - Not on anticoagulation. This was initial due to intracranial hemorrhage while on aspirin. Dr. CrensStanford Breedpreviously discussed the possibility of short-term anticoagulation and placement of Watchman Device which Neurosurgery felt would be a possibility. However, patient did not want to pursue this and prefers not to be on anticoagulation.   - Does not look like patient has Digoxin level checked in a while. Can discuss doing this at follow-up visit.   Hypertension - History of hypertension by BP soft today. On my check, BP was 100/52.  - Will continue Cardizem for rate control.   Possible PAD - Patient's toes on both feet are very discolored and purple to blue at times and normal at other times. Please see picture above.  - Patient denies any pain any leg and she I was able to palpate distal pedal pulses on exam.  - Looks like PCP has ordered ABI and lower extremity arterial dopplers but patient has cancelled these. Would recommend these at a later time.   Disposition: Follow up on Monday with APP.   Medication Adjustments/Labs and Tests Ordered: Current medicines are reviewed at length with the patient today.  Concerns regarding medicines are outlined above.  Orders Placed This Encounter  Procedures  . DG Chest 2 View  . Basic metabolic panel  . CBC  . Pro b natriuretic peptide (BNP)  . ECHOCARDIOGRAM COMPLETE   Meds ordered this encounter  Medications  . furosemide (LASIX) 40 MG tablet    Sig: Take 1 tablet (40 mg total) by mouth daily.    Dispense:  90 tablet    Refill:  3  . potassium  chloride (KLOR-CON M10) 10 MEQ tablet    Sig: Take 1 tablet (10 mEq total) by mouth 2 (two) times daily.    Dispense:  90 tablet    Refill:  3    Patient Instructions  Medication Instructions:  Your physician has recommended you make the following change in your medication:  1. INCREASE LASIX TO 40 MG DAILY  2. START POTASSIUM 10 MEG DAILY.  *If you need a refill on your cardiac medications before your next appointment, please call your pharmacy*   Lab Work: TODAY: BMET, BNP, CBC  If you have labs (blood work) drawn today and your tests are completely normal, you will receive your results only by: Marland Kitchen MyChart Message (if you have MyChart) OR . A paper copy in the mail If you have any lab test that is abnormal or we need to change your treatment, we will call you to review the results.   Testing/Procedures: Your physician has requested that you have an echocardiogram. Echocardiography is a painless test that uses sound waves to create images of your heart. It provides your doctor with information about the size and shape of your heart and how well your heart's chambers and valves are working. This procedure takes approximately one hour. There are no restrictions for this procedure.  A chest x-ray takes a picture of the organs and structures inside the chest, including the heart, lungs, and blood vessels. This test can show several things, including, whether the heart is enlarges; whether fluid is building up in the lungs; and whether pacemaker / defibrillator leads are still in place.   Follow-Up: At [facility], you and your health needs are our priority.  As part of our continuing mission to provide you with exceptional heart care, we have created designated Provider Care Teams.  These Care Teams include your primary Cardiologist (physician) and Advanced Practice Providers (APPs -  Physician Assistants and Nurse Practitioners) who all work together to provide you with the care you  need, when you need it.  We recommend signing up for the patient portal called "MyChart".  Sign up information is provided on this After Visit Summary.  MyChart is used to connect with patients for Virtual Visits (Telemedicine).  Patients are able to view lab/test results, encounter notes, upcoming appointments, etc.  Non-urgent messages can be sent to your provider as well.   To learn more about what you can do with MyChart, go to [email or web address].    Your next appointment:   ON 4/12 AT 3:15 PM  The format for your next appointment:   In Person  Provider:   Jory Sims, DNP, ANP   Other Instructions  IF YOU STILL FEEL SHORT OF [place] TO BE EVALUATED.       Signed, Darreld Mclean, PA-C  12/10/2019 5:28 PM    [facility]

## 2019-12-08 NOTE — Telephone Encounter (Signed)
Returned the call to the patient. She stated that for the past several weeks she has been having increased swelling on her legs bilaterally. She denies any shortness of breath and is on 2 liters of oxygen.   She currently takes Furosemide 20 mg once daily. She did state that she rarely elevates her legs and mainly sits in her wheelchair. She stated that the swelling seems to go down overnight. She does not weigh herself.  She has been advised to try and elevate her legs when she can and watch her sodium intake. She has an appointment on 12/10/19.  She has been advised to call back if she feels like her swelling is worsening or if she develops shortness of breath.

## 2019-12-08 NOTE — Telephone Encounter (Signed)
This does sound more like CHF she is on lasix  Please make sure she is taking this daily  Keep weight log .  Low salt diet and keep legs elevated.  Would call cardiology to advise on leg swellign  Glad for he to be seen in office this week to evaluate , may need labs if cards cant see her. If no APP ov , some MD have openings   Please contact office for sooner follow up if symptoms do not improve or worsen or seek emergency care

## 2019-12-08 NOTE — Telephone Encounter (Signed)
New Message   Pt c/o swelling: STAT is pt has developed SOB within 24 hours  1) How much weight have you gained and in what time span? Unknown patient does not weight herself daily   2) If swelling, where is the swelling located? legs  3) Are you currently taking a fluid pill? yes  4) Are you currently SOB? Yes, but the patient is on oxygen    5) Do you have a log of your daily weights (if so, list)? no  6) Have you gained 3 pounds in a day or 5 pounds in a week? unknown  7) Have you traveled recently? No    Patient states that her legs are just tight but they do not hurt. I did schedule her an appt with Callie on 4/8

## 2019-12-08 NOTE — Telephone Encounter (Signed)
Called and spoke to pt. Pt states she is having an increase in LE edema in BIL knees and thighs. Pt states the swelling has slightly worsened since seeing TP on 3/23. Pt denies pain, redness or warmth at the swollen sites. Pt also denies CP/tightness, f/c/s, dizziness. Pt states she has noticed a slight increase in SOB. Dr. Elsworth Soho had requested LE doppler in early March but pt canceled this because she wasn't having any pain, pt is willing to reschedule this. I was going to advise pt to contact her cardiologist or PCP but since Dr. Elsworth Soho has addressed this in the past I will forward message to APP of the day.   Tammy, please advise. Thank you.

## 2019-12-10 ENCOUNTER — Ambulatory Visit (INDEPENDENT_AMBULATORY_CARE_PROVIDER_SITE_OTHER): Payer: Medicare Other | Admitting: Student

## 2019-12-10 ENCOUNTER — Encounter: Payer: Self-pay | Admitting: Student

## 2019-12-10 ENCOUNTER — Other Ambulatory Visit: Payer: Self-pay

## 2019-12-10 VITALS — BP 96/54 | HR 75 | Ht 66.0 in | Wt 160.2 lb

## 2019-12-10 DIAGNOSIS — I10 Essential (primary) hypertension: Secondary | ICD-10-CM

## 2019-12-10 DIAGNOSIS — I5032 Chronic diastolic (congestive) heart failure: Secondary | ICD-10-CM | POA: Diagnosis not present

## 2019-12-10 DIAGNOSIS — J449 Chronic obstructive pulmonary disease, unspecified: Secondary | ICD-10-CM

## 2019-12-10 DIAGNOSIS — R011 Cardiac murmur, unspecified: Secondary | ICD-10-CM

## 2019-12-10 DIAGNOSIS — R0902 Hypoxemia: Secondary | ICD-10-CM | POA: Diagnosis not present

## 2019-12-10 DIAGNOSIS — I482 Chronic atrial fibrillation, unspecified: Secondary | ICD-10-CM

## 2019-12-10 DIAGNOSIS — I5033 Acute on chronic diastolic (congestive) heart failure: Secondary | ICD-10-CM | POA: Diagnosis not present

## 2019-12-10 DIAGNOSIS — R600 Localized edema: Secondary | ICD-10-CM | POA: Diagnosis not present

## 2019-12-10 MED ORDER — FUROSEMIDE 40 MG PO TABS: 40.0000 mg | ORAL_TABLET | Freq: Every day | ORAL | 3 refills | Status: DC

## 2019-12-10 MED ORDER — POTASSIUM CHLORIDE CRYS ER 10 MEQ PO TBCR: 10.0000 meq | EXTENDED_RELEASE_TABLET | Freq: Two times a day (BID) | ORAL | 3 refills | Status: DC

## 2019-12-10 NOTE — Patient Instructions (Addendum)
Medication Instructions:  Your physician has recommended you make the following change in your medication:  1. INCREASE LASIX TO 40 MG DAILY  2. START POTASSIUM 10 MEG DAILY.  *If you need a refill on your cardiac medications before your next appointment, please call your pharmacy*   Lab Work: TODAY: BMET, BNP, CBC  If you have labs (blood work) drawn today and your tests are completely normal, you will receive your results only by: Marland Kitchen MyChart Message (if you have MyChart) OR . A paper copy in the mail If you have any lab test that is abnormal or we need to change your treatment, we will call you to review the results.   Testing/Procedures: Your physician has requested that you have an echocardiogram. Echocardiography is a painless test that uses sound waves to create images of your heart. It provides your doctor with information about the size and shape of your heart and how well your heart's chambers and valves are working. This procedure takes approximately one hour. There are no restrictions for this procedure.  A chest x-ray takes a picture of the organs and structures inside the chest, including the heart, lungs, and blood vessels. This test can show several things, including, whether the heart is enlarges; whether fluid is building up in the lungs; and whether pacemaker / defibrillator leads are still in place.   Follow-Up: At [facility], you and your health needs are our priority.  As part of our continuing mission to provide you with exceptional heart care, we have created designated Provider Care Teams.  These Care Teams include your primary Cardiologist (physician) and Advanced Practice Providers (APPs -  Physician Assistants and Nurse Practitioners) who all work together to provide you with the care you need, when you need it.  We recommend signing up for the patient portal called "MyChart".  Sign up information is provided on this After Visit Summary.  MyChart is used to  connect with patients for Virtual Visits (Telemedicine).  Patients are able to view lab/test results, encounter notes, upcoming appointments, etc.  Non-urgent messages can be sent to your provider as well.   To learn more about what you can do with MyChart, go to [email or web address].    Your next appointment:   ON 4/12 AT 3:15 PM  The format for your next appointment:   In Person  Provider:   Jory Sims, DNP, ANP   Other Instructions  IF YOU STILL FEEL SHORT OF [place] TO BE EVALUATED.

## 2019-12-11 ENCOUNTER — Telehealth: Payer: Self-pay | Admitting: Student

## 2019-12-11 ENCOUNTER — Telehealth: Payer: Self-pay | Admitting: Adult Health

## 2019-12-11 ENCOUNTER — Ambulatory Visit
Admission: RE | Admit: 2019-12-11 | Discharge: 2019-12-11 | Disposition: A | Discharge status: Home / Self Care | Payer: Medicare Other | Source: Ambulatory Visit | Attending: Student | Admitting: Student

## 2019-12-11 DIAGNOSIS — I5033 Acute on chronic diastolic (congestive) heart failure: Secondary | ICD-10-CM

## 2019-12-11 DIAGNOSIS — I482 Chronic atrial fibrillation, unspecified: Secondary | ICD-10-CM

## 2019-12-11 DIAGNOSIS — R0602 Shortness of breath: Secondary | ICD-10-CM | POA: Diagnosis not present

## 2019-12-11 LAB — BASIC METABOLIC PANEL
BUN/Creatinine Ratio: 21 (ref 12–28)
BUN: 17 mg/dL (ref 8–27)
CO2: 28 mmol/L (ref 20–29)
Calcium: 9 mg/dL (ref 8.7–10.3)
Chloride: 99 mmol/L (ref 96–106)
Creatinine, Ser: 0.82 mg/dL (ref 0.57–1.00)
GFR calc Af Amer: 77 mL/min/{1.73_m2} (ref >59)
GFR calc non Af Amer: 67 mL/min/{1.73_m2} (ref >59)
Glucose: 90 mg/dL (ref 65–99)
Potassium: 4.2 mmol/L (ref 3.5–5.2)
Sodium: 142 mmol/L (ref 134–144)

## 2019-12-11 LAB — CBC
Hematocrit: 44.1 % (ref 34.0–46.6)
Hemoglobin: 13.5 g/dL (ref 11.1–15.9)
MCH: 27.5 pg (ref 26.6–33.0)
MCHC: 30.6 g/dL — ABNORMAL LOW (ref 31.5–35.7)
MCV: 90 fL (ref 79–97)
Platelets: 278 10*3/uL (ref 150–450)
RBC: 4.91 x10E6/uL (ref 3.77–5.28)
RDW: 14.7 % (ref 11.7–15.4)
WBC: 9.3 10*3/uL (ref 3.4–10.8)

## 2019-12-11 LAB — PRO B NATRIURETIC PEPTIDE: NT-Pro BNP: 3745 pg/mL — ABNORMAL HIGH (ref 0–738)

## 2019-12-11 IMAGING — DX DG CHEST 2V
2 series · 2 of 2 positions shown · non-contrast
Comparison: August 22, 2018.

CLINICAL DATA: Shortness of breath, cough.

EXAM:
CHEST - 2 VIEW

[chest pa]
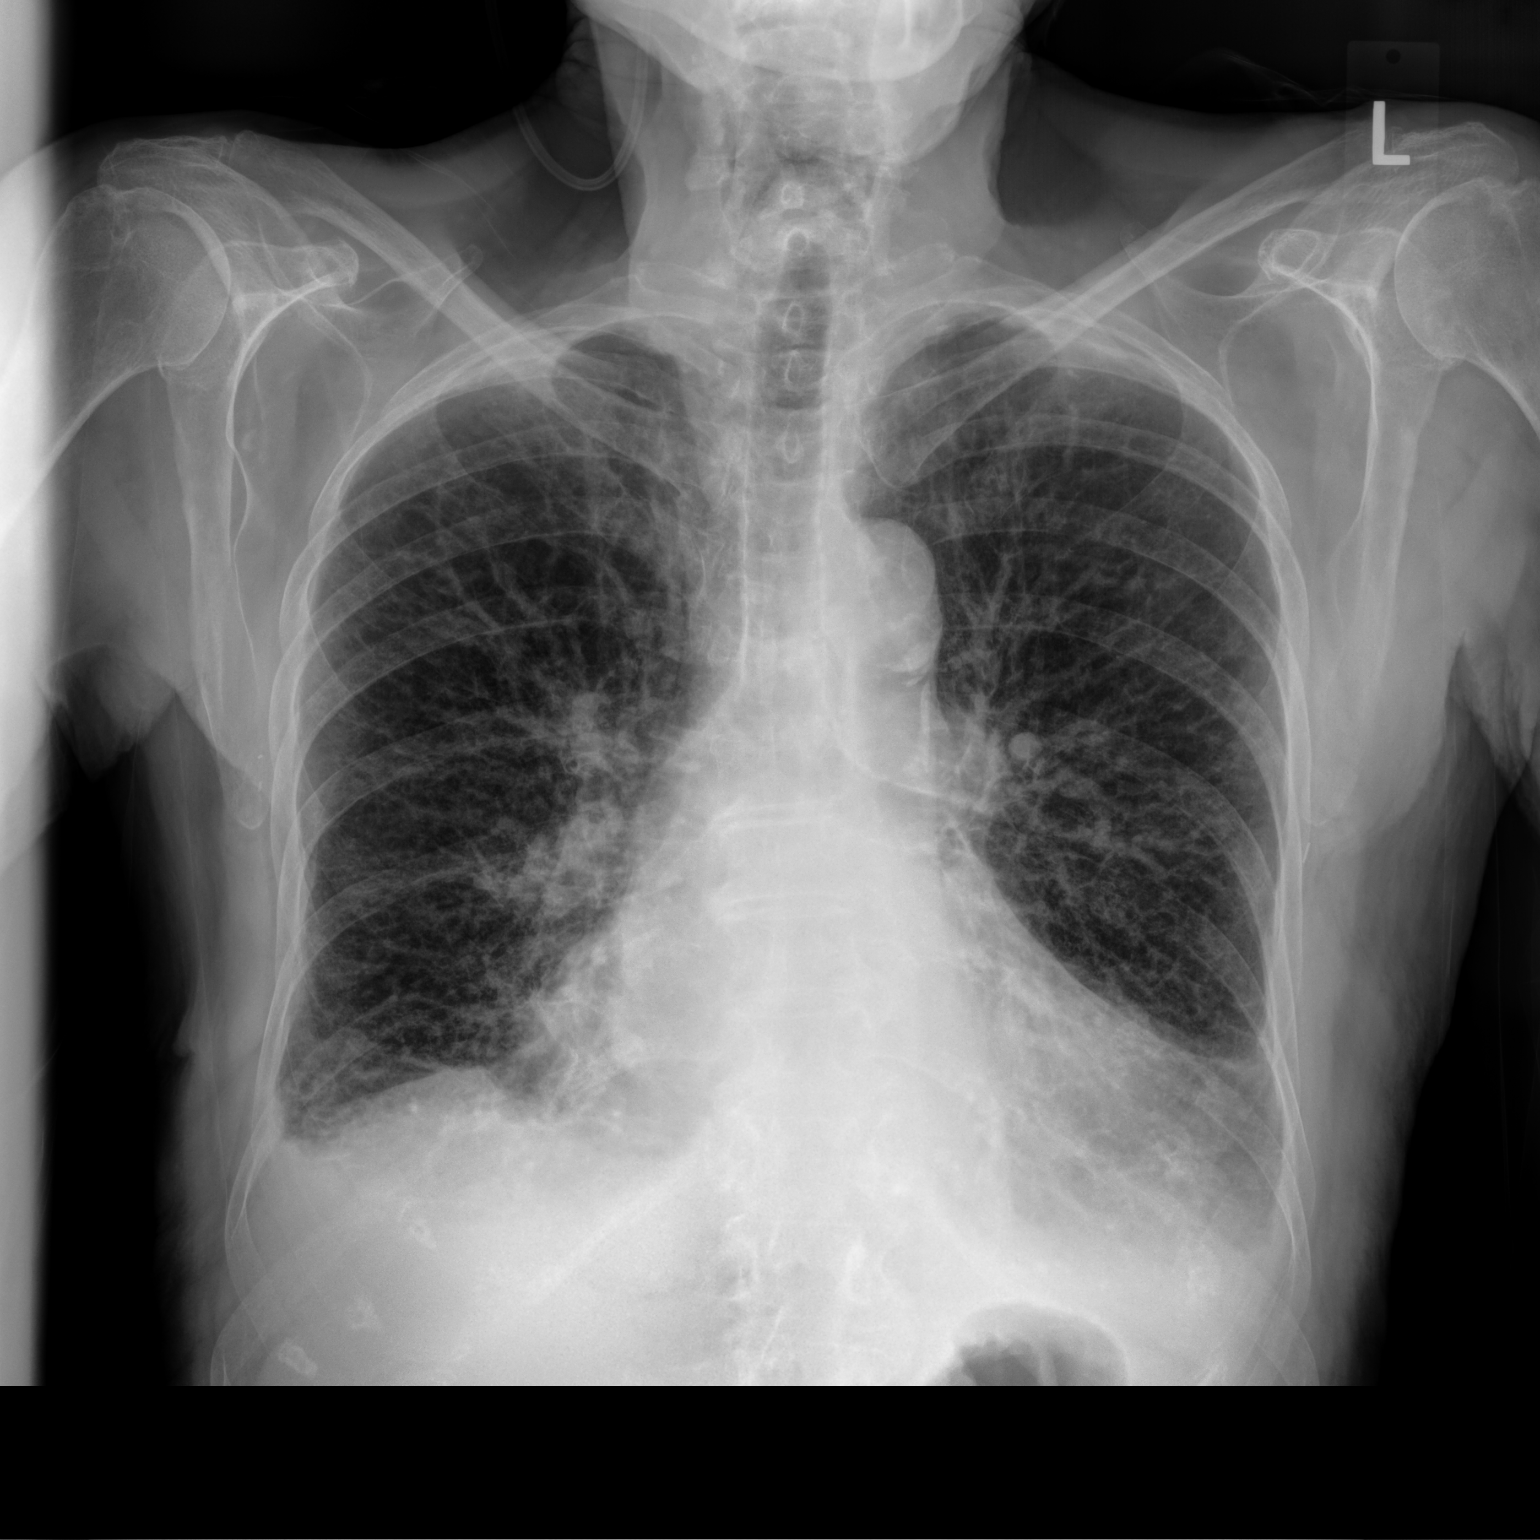

[chest lat]
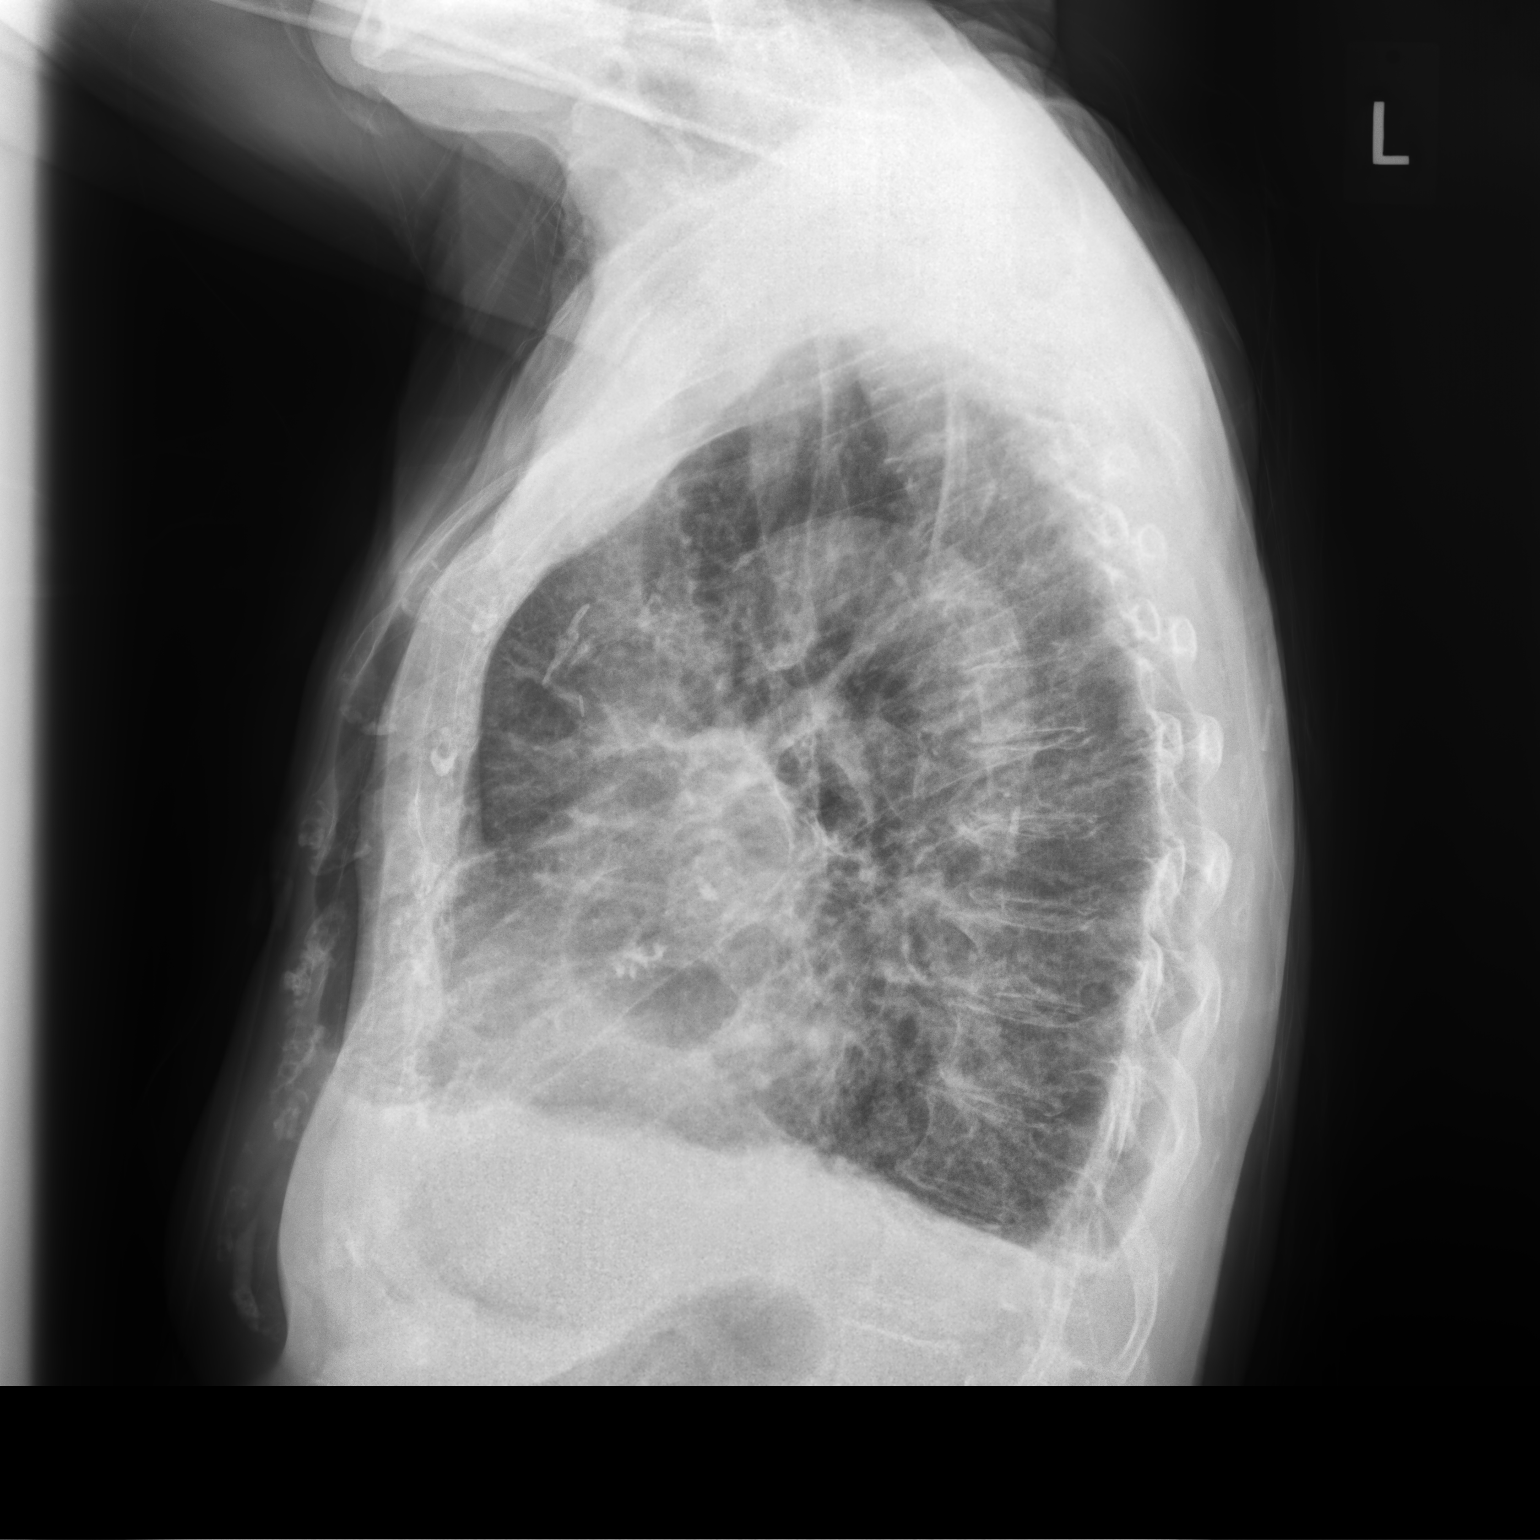

[2 of 2 positions shown; findings below may reference images not displayed]

FINDINGS: Stable cardiomegaly. No pneumothorax is noted. Mild bibasilar
subsegmental atelectasis is noted with small bilateral pleural
effusions. Bony thorax is unremarkable.
IMPRESSION: Mild bibasilar subsegmental atelectasis with small bilateral pleural
effusions.

## 2019-12-11 NOTE — Telephone Encounter (Signed)
[phone]

## 2019-12-11 NOTE — Telephone Encounter (Signed)
   Called patient to discuss lab results from yesterday. Pro BNP elevated in the 3,000s. BP has been soft but discussed with Dr. Audie Box who thinks increasing to Lasix 40 mg twice daily (I told her to increase from 36m to 439monce daily yesterday) should be OK. Patient does note some dizziness in the morning after taking Lasix but states this does not last long and improves after she eats breakfast. Advised patient try Lasix twice daily but if dizziness worsens after higher dose in the morning to hold off on the evening dose. Advised taking additional dose of K-Dur 10 mEq if she does taken second dose of Lasix in the afternoon/evening. Patient is going to try to find her BP machine so she can monitor this at home. Patient is going for chest x-ray today so we will see what that shows. Re-emphasized that if breathing worsens at all before follow-up visit on Monday, she should go to the ED for further evaluation.  CaDarreld McleanPA-C 12/11/2019 9:48 AM

## 2019-12-11 NOTE — Telephone Encounter (Signed)
She was seen by cardiology and they adjusted her diuretics.  Would not recommend prednisone or abx for this  Cont to follow up with cardiology for this   Please contact office for sooner follow up if symptoms do not improve or worsen or seek emergency care

## 2019-12-11 NOTE — Telephone Encounter (Signed)
I called and spoke with the patient and made her aware of providers recommendation. She verbalized understanding.

## 2019-12-11 NOTE — Telephone Encounter (Signed)
Pt calling back about prednisone.  Pharmacy closes at 5.  Not sure if she needs an antibiotic.  [name].  Please advise.

## 2019-12-11 NOTE — Telephone Encounter (Signed)
Please advise if patient can have prednisone.

## 2019-12-13 NOTE — Progress Notes (Signed)
Cardiology Office Note   Date:  12/13/2019   ID:  [name], DOB [birth year], MRN [id number]  PCP:  Janith Lima, MD  Cardiologist:  Lubertha South  No chief complaint on file.    History of Present Illness: [name] is a 85 y.o. female who presents for ongoing assessment and management of chronic atrial fibrillation not on anticoagulation, this is due to history of intracranial hemorrhage in 0355, chronic diastolic heart failure, hypertension, with other history to include COPD, GERD, and breast cancer.  She was last seen in the office by Kathyrn Sheriff, PA, on 12/10/2019 with complaints of lower extremity edema. She was evaluated for a watchman device however the patient declined. She is followed by pulmonology for COPD.  On that office visit she was noted to be visibly short of breath despite wearing O2, and was found to have worsening lower extremity edema. The patient did not have orthopnea but was more comfortable sleeping in a recliner. In the office her O2 sat was 97% but was uncertain if this was accurate.  At that office visit labs were drawn to include BNP, CBC, and BMET. Echocardiogram and chest x-ray was also ordered. Patient did have a soft blood pressure and therefore Lasix was increased to 20 mg daily to 40 mg daily, potassium was added at 10 mEq with increased dose. She is here for close follow-up to evaluate her response to medication. She was advised to go to the ED if breathing status worsened.  Review of labs revealed elevated BNP of 3745. After review of labs the patient was increased to Lasix 40 mg twice daily. She was advised to take additional doses of potassium when she took the second dose of Lasix in the evening. Chest x-ray completed on 12/11/2019 revealed CHF with interstitial edema and effusions which were slightly worsened. Echocardiogram has been scheduled for 12/10/2019.  She has not yet had her echo, scheduled for 12/14/2019.   She remains dyspneic,  had only lost 3 lbs, and is becoming very frail. She is on 02 via [place]. She is actually feeling worse instead of better.   Past Medical History:  Diagnosis Date  . Arthritis   . COPD (chronic obstructive pulmonary disease) ([place])   . Dyspnea on exertion   . GERD (gastroesophageal reflux disease)   . Glaucoma of both eyes   . History of atrial fibrillation without current medication 2010   POST SURG 2010--  PER PT NO ISSUES SINCE  . History of breast cancer    DX DCIS IN 2004--  S/P RIGHT MASTECTOMY , NO CHEMORADIATION---  NO RECURRENCE  . History of CHF (congestive heart failure)    POST SURG 2010  . History of hypertension   . History of shingles    02/ 2015--  back of neck and left flank--  no residual pain  . ICH (intracerebral hemorrhage) ([place]) 02/05/2015  . Nephrolithiasis   . Recurrent bladder papillary carcinoma ([place]) first dx 06/ 2014   s/p  turbt's  and instillation mitomycin c (chemo)//dx again in 2015-different type  . Stroke [facility])     Past Surgical History:  Procedure Laterality Date  . CATARACT EXTRACTION W/ INTRAOCULAR LENS  IMPLANT, BILATERAL    . CRANIECTOMY N/A 02/05/2015   Procedure: SUBOCCIPITAL CRANIECTOMY EVACUATION OF HEMATOMA;  Surgeon: Consuella Lose, MD;  Location: [place] [facility];  Service: Neurosurgery;  Laterality: N/A;  . CYSTOSCOPY W/ RETROGRADES Bilateral 08/11/2014   Procedure: CYSTOSCOPY WITH BILATERAL RETROGRADE PYELOGRAM AND MITOMYCIN INSTILLATION;  Surgeon: Alexis Frock, MD;  Location: [facility];  Service: Urology;  Laterality: Bilateral;  . CYSTOSCOPY W/ URETERAL STENT PLACEMENT Bilateral 03/26/2013   Procedure: CYSTOSCOPY WITH BILATERAL RETROGRADE PYELOGRAM/URETERAL STENT PLACEMENT;  Surgeon: Alexis Frock, MD;  Location[facility];  Service: Urology;  Laterality: Bilateral;  . CYSTOSCOPY W/ URETERAL STENT PLACEMENT Left 05/13/2013   Procedure: CYSTOSCOPY WITH RETROGRADE PYELOGRAM/URETERAL STENT PLACEMENT STENT  EXCHANGE;  Surgeon: Alexis Frock, MD;  Location: [facility];  Service: Urology;  Laterality: Left;  . PARTIAL MASTECTOMY WITH NEEDLE LOCALIZATION Right 01-14-2003   DCIS  . REMOVAL CYST LEFT HAND  2013  . TOTAL KNEE ARTHROPLASTY Right 03-22-2009  . TOTAL MASTECTOMY Right 02-03-2003   W/ SLN BX  AND  POST 03-01-2003 EVACUATION HEMATOMA  . TOTAL MASTECTOMY Left 05/04/2019   Procedure: LEFT TOTAL MASTECTOMY;  Surgeon: Fanny Skates, MD;  Location: [place];  Service: General;  Laterality: Left;  . TRANSTHORACIC ECHOCARDIOGRAM  03-25-2009   MILD LVF/ EF 70-80%/ MILD INCREASE SYSTOLIC PULMONARY  PRESSURE  . TRANSURETHRAL RESECTION OF BLADDER TUMOR WITH GYRUS (TURBT-GYRUS) N/A 03/26/2013   Procedure: TRANSURETHRAL RESECTION OF BLADDER TUMOR WITH GYRUS (TURBT-GYRUS);  Surgeon: Alexis Frock, MD;  Location: [facility];  Service: Urology;  Laterality: N/A;  . TRANSURETHRAL RESECTION OF BLADDER TUMOR WITH GYRUS (TURBT-GYRUS) N/A 05/13/2013   Procedure: TRANSURETHRAL RESECTION OF BLADDER TUMOR WITH GYRUS (TURBT-GYRUS)  RE-STAGING TRANSURETHRAL RESECTION OF BLADDER TUMOR, LEFT RETROGRADE PYELOGRAM AND STENT EXCHANGE;  Surgeon: Alexis Frock, MD;  Location: [facility];  Service: Urology;  Laterality: N/A;  . TRANSURETHRAL RESECTION OF BLADDER TUMOR WITH GYRUS (TURBT-GYRUS) N/A 08/11/2014   Procedure: TRANSURETHRAL RESECTION OF BLADDER TUMOR WITH GYRUS (TURBT-GYRUS);  Surgeon: Alexis Frock, MD;  Location: [facility];  Service: Urology;  Laterality: N/A;  . TUBAL LIGATION       Current Outpatient Medications  Medication Sig Dispense Refill  . albuterol (VENTOLIN HFA) 108 (90 Base) MCG/ACT inhaler Inhale 2 puffs into the lungs every 6 (six) hours as needed for wheezing or shortness of breath. 8 g 2  . atorvastatin (LIPITOR) 10 MG tablet TAKE ONE TABLET DAILY AT 6PM 90 tablet 0  . BROVANA 15 MCG/2ML NEBU USE 1 VIAL  IN  NEBULIZER TWICE  DAILY -  (Morning and Evening) 60 mL 5  . budesonide (PULMICORT) 0.5 MG/2ML nebulizer solution 2ML BY NEBULIZER TWICE DAILY 120 mL 5  . digoxin (LANOXIN) 0.125 MG tablet Take 1 tablet (125 mcg total) by mouth daily. 90 tablet 0  . diltiazem (CARDIZEM CD) 240 MG 24 hr capsule TAKE ONE CAPSULE EACH DAY 90 capsule 0  . fluorometholone (FML) 0.1 % ophthalmic suspension Place 1 drop into both eyes daily.     . furosemide (LASIX) 40 MG tablet Take 1 tablet (40 mg total) by mouth daily. 90 tablet 3  . guaiFENesin (MUCINEX) 600 MG 12 hr tablet Take 600 mg by mouth daily.    Marland Kitchen ipratropium (ATROVENT) 0.02 % nebulizer solution Take 2.5 mLs (0.5 mg total) by nebulization 3 (three) times daily. DX: J44.9 225 mL 5  . latanoprost (XALATAN) 0.005 % ophthalmic solution Place 1 drop into both eyes at bedtime. Reported on 11/09/2015    . OXYGEN Inhale 2 L into the lungs continuous.     . pantoprazole (PROTONIX) 40 MG tablet TAKE ONE TABLET DAILY 90 tablet 1  . potassium chloride (KLOR-CON M10) 10 MEQ tablet Take 1 tablet (10 mEq total) by mouth 2 (  two) times daily. 90 tablet 3  . SHINGRIX injection     . SIMBRINZA 1-0.2 % SUSP Place 1 drop into both eyes 2 (two) times daily.   99  . valACYclovir (VALTREX) 1000 MG tablet Take 1,000 mg by mouth daily.      No current facility-administered medications for this visit.    Allergies:   Heparin and Morphine and related    Social History:  The patient  reports that she quit smoking about 16 years ago. Her smoking use included cigarettes. She has a 40.00 pack-year smoking history. She has never used smokeless tobacco. She reports that she does not drink alcohol or use drugs.   Family History:  The patient's family history includes Breast cancer (age of onset: 64) in her maternal aunt; Breast cancer (age of onset: 23) in her daughter; Breast cancer (age of onset: 64) in an other family member; Cancer in her maternal aunt; Colon cancer in her maternal grandmother; Colon cancer (age  of onset: 11) in her mother; Prostate cancer in her paternal grandfather; Stroke in her maternal grandmother and paternal grandmother.    ROS: All other systems are reviewed and negative. Unless otherwise mentioned in H&P    PHYSICAL EXAM: VS:  LMP 12/03/2002  , BMI There is no height or weight on file to calculate BMI. GEN: Well nourished, well developed, in no acute distress HEENT: normal Neck: no JVD, carotid bruits, or masses Cardiac: RRR; no murmurs, rubs, or gallops, 2+ pitting edema up to the knees bilaterally with erythema bilaterally of the lower extremities.  Respiratory:  Clear to auscultation bilaterally, wearing O2 via nasal cannula at 3 L, dyspneic at rest GI: soft, nontender, nondistended, + BS MS: no deformity or atrophy Skin: warm and dry, no rash, pale neuro:  Strength and sensation are intact Psych: euthymic mood, full affect   EKG:  EKG is not ordered today.   Recent Labs: 04/08/2019: ALT 31 12/10/2019: BUN 17; Creatinine, Ser 0.82; Hemoglobin 13.5; NT-Pro BNP 3,745; Platelets 278; Potassium 4.2; Sodium 142    Lipid Panel    Component Value Date/Time   CHOL 121 04/19/2017 0936   TRIG 114 04/19/2017 0936   HDL 46 04/19/2017 0936   CHOLHDL 2.6 04/19/2017 0936   CHOLHDL 3 05/09/2016 1216   VLDL 36.8 05/09/2016 1216   LDLCALC 52 04/19/2017 0936      Wt Readings from Last 3 Encounters:  12/10/19 160 lb 3.2 oz (72.7 kg)  11/24/19 155 lb 6.4 oz (70.5 kg)  10/27/19 153 lb 3.2 oz (69.5 kg)      Other studies Reviewed: CXR 12/11/2019 FINDINGS: Number ready to go home never nurse a ready to go vision that is Cardiomegaly and aortic atherosclerosis. Pulmonary venous hypertension with interstitial pulmonary edema. Small effusions layering dependently with dependent atelectasis. Findings slightly worsened compared to the study of February.  Echocardiogram 02/07/2015 Procedure narrative: Transthoracic echocardiography. Image  quality was poor. The study was  technically difficult, as a  result of poor acoustic windows, poor sound wave transmission,  and restricted patient mobility.  - Left ventricle: The cavity size was normal. Wall thickness was  increased in a pattern of mild LVH. Systolic function was  vigorous. The estimated ejection fraction was in the range of 65%  to 70%. Wall motion was normal; there were no regional wall  motion abnormalities. Doppler parameters are consistent with  abnormal left ventricular relaxation (grade 1 diastolic  dysfunction). The E/e&' ratio is between 8-15, suggesting  indeterminate LV  filling pressure.  - Aortic valve: Poorly visualized. There was no stenosis. There was  no regurgitation.  - Mitral valve: Calcified annulus. There was trivial regurgitation.  - Left atrium: The atrium was normal in size.  - Right atrium: The atrium was mildly dilated.   IMPRESSION: Congestive heart failure with interstitial edema and effusions, slightly worsened.    ASSESSMENT AND PLAN:  1.  Acute on chronic decompensated diastolic CHF: She continues to be very dyspneic with volume overload despite outpatient treatment with increased dose of Lasix.  She is only lost 3 pounds.  She continues to have significant lower extremity edema.  Dyspnea remains prominent despite O2 via nasal cannula.  Chest x-ray and BNP last week indicated decompensated CHF.  I will have her admitted for IV diuresis.  Question whether or not thoracentesis is necessary with bilateral pleural effusions.  Will defer to Dr. Stanford Breed on his evaluation and follow-up chest x-ray.  Echocardiogram was scheduled as an outpatient on 12/15/2019 but I will get this ordered as an inpatient.  I have discussed with his doctor [name] who is currently DOD in [employer] today.  She has had an opportunity to speak with the patient and examine her as well.  She is in agreement with my assessment and plan to have this patient admitted.  Dr.  Stanford Breed has been notified as well.  2.  Hypotension: I am concerned about her blood pressure being so soft on diltiazem with IV Lasix.  I will significantly reduce diltiazem to 30 mg twice daily initially to allow for better blood pressure status during diuresis.  This of course can be titrated while hospitalized if blood pressure becomes elevated.  3.  O2 dependent COPD: Daughter endorses that her breathing status is worsened compared to baseline with her COPD.  We will check Covid test on admission along with BN P and repeat her chest x-ray.  4.  Atrial fibrillation: She remains on digoxin 0.125 mg daily, and diltiazem.  Will reduce diltiazem dose to allow for better blood pressure in the setting of IV diuresis to prevent hypotension worsening.  She is currently not on anticoagulation with history of cerebellar hemorrhage.  We will check a CBC.  5.  Glaucoma: The patient may take her home glaucoma medications.    Current medicines are reviewed at length with the patient today.  I have spent 45 minutes dedicated to the care of this patient on the date of this encounter to include pre-visit review of records, assessment, management and diagnostic testing,with shared decision making.  Labs/ tests ordered today include: Hospital admission with labs per orders.  Phill Myron. West Pugh, ANP, AACC   12/13/2019 1:54 PM    [facility] [address] Office [phone] Fax [phone]  Notice: This dictation was prepared with Dragon dictation along with smaller phrase technology. Any transcriptional errors that result from this process are unintentional and may not be corrected upon review.

## 2019-12-14 ENCOUNTER — Other Ambulatory Visit: Payer: Self-pay | Admitting: Physician Assistant

## 2019-12-14 ENCOUNTER — Other Ambulatory Visit: Payer: Self-pay | Admitting: Adult Health

## 2019-12-14 ENCOUNTER — Ambulatory Visit: Payer: Medicare Other | Admitting: Adult Health

## 2019-12-14 ENCOUNTER — Ambulatory Visit (INDEPENDENT_AMBULATORY_CARE_PROVIDER_SITE_OTHER): Payer: Medicare Other | Admitting: Adult Health

## 2019-12-14 ENCOUNTER — Inpatient Hospital Stay (HOSPITAL_COMMUNITY)
Admission: AD | Admit: 2019-12-14 | Discharge: 2019-12-17 | DRG: 292 | Disposition: A | Discharge status: Home / Self Care | Payer: Medicare Other | Attending: Cardiology | Admitting: Cardiology

## 2019-12-14 ENCOUNTER — Encounter: Payer: Self-pay | Admitting: Adult Health

## 2019-12-14 ENCOUNTER — Other Ambulatory Visit: Payer: Self-pay

## 2019-12-14 VITALS — BP 98/59 | HR 81 | Ht 66.0 in | Wt 157.4 lb

## 2019-12-14 DIAGNOSIS — I5033 Acute on chronic diastolic (congestive) heart failure: Secondary | ICD-10-CM | POA: Diagnosis present

## 2019-12-14 DIAGNOSIS — I4891 Unspecified atrial fibrillation: Secondary | ICD-10-CM | POA: Diagnosis not present

## 2019-12-14 DIAGNOSIS — Z853 Personal history of malignant neoplasm of breast: Secondary | ICD-10-CM | POA: Diagnosis not present

## 2019-12-14 DIAGNOSIS — I614 Nontraumatic intracerebral hemorrhage in cerebellum: Secondary | ICD-10-CM

## 2019-12-14 DIAGNOSIS — Z823 Family history of stroke: Secondary | ICD-10-CM

## 2019-12-14 DIAGNOSIS — Z20822 Contact with and (suspected) exposure to covid-19: Secondary | ICD-10-CM | POA: Diagnosis present

## 2019-12-14 DIAGNOSIS — J449 Chronic obstructive pulmonary disease, unspecified: Secondary | ICD-10-CM | POA: Diagnosis present

## 2019-12-14 DIAGNOSIS — J9811 Atelectasis: Secondary | ICD-10-CM | POA: Diagnosis present

## 2019-12-14 DIAGNOSIS — Z79899 Other long term (current) drug therapy: Secondary | ICD-10-CM | POA: Diagnosis not present

## 2019-12-14 DIAGNOSIS — I4811 Longstanding persistent atrial fibrillation: Secondary | ICD-10-CM

## 2019-12-14 DIAGNOSIS — H409 Unspecified glaucoma: Secondary | ICD-10-CM | POA: Diagnosis present

## 2019-12-14 DIAGNOSIS — Z800 Family history of malignant neoplasm of digestive organs: Secondary | ICD-10-CM | POA: Diagnosis not present

## 2019-12-14 DIAGNOSIS — I472 Ventricular tachycardia: Secondary | ICD-10-CM | POA: Diagnosis not present

## 2019-12-14 DIAGNOSIS — K219 Gastro-esophageal reflux disease without esophagitis: Secondary | ICD-10-CM | POA: Diagnosis present

## 2019-12-14 DIAGNOSIS — Z885 Allergy status to narcotic agent status: Secondary | ICD-10-CM | POA: Diagnosis not present

## 2019-12-14 DIAGNOSIS — Z87891 Personal history of nicotine dependence: Secondary | ICD-10-CM

## 2019-12-14 DIAGNOSIS — Z888 Allergy status to other drugs, medicaments and biological substances status: Secondary | ICD-10-CM | POA: Diagnosis not present

## 2019-12-14 DIAGNOSIS — Z87442 Personal history of urinary calculi: Secondary | ICD-10-CM

## 2019-12-14 DIAGNOSIS — Z9981 Dependence on supplemental oxygen: Secondary | ICD-10-CM | POA: Diagnosis not present

## 2019-12-14 DIAGNOSIS — I110 Hypertensive heart disease with heart failure: Secondary | ICD-10-CM | POA: Diagnosis present

## 2019-12-14 DIAGNOSIS — I5032 Chronic diastolic (congestive) heart failure: Secondary | ICD-10-CM

## 2019-12-14 DIAGNOSIS — Z9013 Acquired absence of bilateral breasts and nipples: Secondary | ICD-10-CM

## 2019-12-14 DIAGNOSIS — R0902 Hypoxemia: Secondary | ICD-10-CM

## 2019-12-14 DIAGNOSIS — I482 Chronic atrial fibrillation, unspecified: Secondary | ICD-10-CM

## 2019-12-14 DIAGNOSIS — Z8673 Personal history of transient ischemic attack (TIA), and cerebral infarction without residual deficits: Secondary | ICD-10-CM

## 2019-12-14 DIAGNOSIS — R54 Age-related physical debility: Secondary | ICD-10-CM | POA: Diagnosis present

## 2019-12-14 DIAGNOSIS — R0600 Dyspnea, unspecified: Secondary | ICD-10-CM

## 2019-12-14 DIAGNOSIS — Z803 Family history of malignant neoplasm of breast: Secondary | ICD-10-CM

## 2019-12-14 DIAGNOSIS — Z66 Do not resuscitate: Secondary | ICD-10-CM | POA: Diagnosis present

## 2019-12-14 DIAGNOSIS — I4821 Permanent atrial fibrillation: Secondary | ICD-10-CM | POA: Diagnosis present

## 2019-12-14 DIAGNOSIS — I959 Hypotension, unspecified: Secondary | ICD-10-CM | POA: Diagnosis present

## 2019-12-14 DIAGNOSIS — Z8679 Personal history of other diseases of the circulatory system: Secondary | ICD-10-CM

## 2019-12-14 DIAGNOSIS — J9611 Chronic respiratory failure with hypoxia: Secondary | ICD-10-CM | POA: Diagnosis present

## 2019-12-14 DIAGNOSIS — Z7951 Long term (current) use of inhaled steroids: Secondary | ICD-10-CM

## 2019-12-14 DIAGNOSIS — Z8619 Personal history of other infectious and parasitic diseases: Secondary | ICD-10-CM

## 2019-12-14 LAB — CBC WITH DIFFERENTIAL/PLATELET
Abs Immature Granulocytes: 0.02 10*3/uL (ref 0.00–0.07)
Basophils Absolute: 0.1 10*3/uL (ref 0.0–0.1)
Basophils Relative: 1 %
Eosinophils Absolute: 0.2 10*3/uL (ref 0.0–0.5)
Eosinophils Relative: 2 %
HCT: 43 % (ref 36.0–46.0)
Hemoglobin: 13.2 g/dL (ref 12.0–15.0)
Immature Granulocytes: 0 %
Lymphocytes Relative: 17 %
Lymphs Abs: 1.5 10*3/uL (ref 0.7–4.0)
MCH: 27.8 pg (ref 26.0–34.0)
MCHC: 30.7 g/dL (ref 30.0–36.0)
MCV: 90.5 fL (ref 80.0–100.0)
Monocytes Absolute: 1 10*3/uL (ref 0.1–1.0)
Monocytes Relative: 12 %
Neutro Abs: 6.1 10*3/uL (ref 1.7–7.7)
Neutrophils Relative %: 68 %
Platelets: 296 10*3/uL (ref 150–400)
RBC: 4.75 MIL/uL (ref 3.87–5.11)
RDW: 15.9 % — ABNORMAL HIGH (ref 11.5–15.5)
WBC: 9 10*3/uL (ref 4.0–10.5)
nRBC: 0 % (ref 0.0–0.2)

## 2019-12-14 LAB — COMPREHENSIVE METABOLIC PANEL
ALT: 16 U/L (ref 0–44)
AST: 24 U/L (ref 15–41)
Albumin: 3.6 g/dL (ref 3.5–5.0)
Alkaline Phosphatase: 106 U/L (ref 38–126)
Anion gap: 8 (ref 5–15)
BUN: 18 mg/dL (ref 8–23)
CO2: 33 mmol/L — ABNORMAL HIGH (ref 22–32)
Calcium: 8.9 mg/dL (ref 8.9–10.3)
Chloride: 101 mmol/L (ref 98–111)
Creatinine, Ser: 0.84 mg/dL (ref 0.44–1.00)
GFR calc Af Amer: >60 mL/min (ref >60)
GFR calc non Af Amer: >60 mL/min (ref >60)
Glucose, Bld: 106 mg/dL — ABNORMAL HIGH (ref 70–99)
Potassium: 4.6 mmol/L (ref 3.5–5.1)
Sodium: 142 mmol/L (ref 135–145)
Total Bilirubin: 0.9 mg/dL (ref 0.3–1.2)
Total Protein: 6.6 g/dL (ref 6.5–8.1)

## 2019-12-14 LAB — DIGOXIN LEVEL: Digoxin Level: 0.8 ng/mL (ref 0.8–2.0)

## 2019-12-14 LAB — TSH: TSH: 1.49 u[IU]/mL (ref 0.350–4.500)

## 2019-12-14 LAB — BRAIN NATRIURETIC PEPTIDE: B Natriuretic Peptide: 738.5 pg/mL — ABNORMAL HIGH (ref 0.0–100.0)

## 2019-12-14 MED ORDER — FUROSEMIDE 10 MG/ML IJ SOLN
40.0000 mg | Freq: Two times a day (BID) | INTRAMUSCULAR | Status: DC
  Administered 2019-12-14: 40 mg via INTRAVENOUS
  Filled 2019-12-14: qty 4

## 2019-12-14 MED ORDER — BRIMONIDINE TARTRATE 0.2 % OP SOLN
1.0000 [drp] | Freq: Two times a day (BID) | OPHTHALMIC | Status: DC
  Administered 2019-12-14 – 2019-12-15 (×2): 1 [drp] via OPHTHALMIC
  Filled 2019-12-14: qty 5

## 2019-12-14 MED ORDER — SODIUM CHLORIDE 0.9% FLUSH: 3.0000 mL | INTRAVENOUS | Status: DC | PRN

## 2019-12-14 MED ORDER — ALBUTEROL SULFATE HFA 108 (90 BASE) MCG/ACT IN AERS: 2.0000 | INHALATION_SPRAY | Freq: Four times a day (QID) | RESPIRATORY_TRACT | Status: DC | PRN

## 2019-12-14 MED ORDER — DILTIAZEM HCL 60 MG PO TABS
30.0000 mg | ORAL_TABLET | Freq: Four times a day (QID) | ORAL | Status: DC
  Administered 2019-12-15 – 2019-12-16 (×5): 30 mg via ORAL
  Filled 2019-12-14 (×5): qty 1

## 2019-12-14 MED ORDER — ACETAMINOPHEN 325 MG PO TABS: 650.0000 mg | ORAL_TABLET | ORAL | Status: DC | PRN

## 2019-12-14 MED ORDER — ASPIRIN EC 81 MG PO TBEC: 81.0000 mg | DELAYED_RELEASE_TABLET | Freq: Every day | ORAL | Status: DC

## 2019-12-14 MED ORDER — FLUOROMETHOLONE 0.1 % OP OINT
TOPICAL_OINTMENT | Freq: Every day | OPHTHALMIC | Status: DC
  Filled 2019-12-14: qty 3.5

## 2019-12-14 MED ORDER — DIGOXIN 125 MCG PO TABS
0.1250 mg | ORAL_TABLET | Freq: Every day | ORAL | Status: DC
  Administered 2019-12-15 – 2019-12-17 (×3): 0.125 mg via ORAL
  Filled 2019-12-14 (×3): qty 1

## 2019-12-14 MED ORDER — ATORVASTATIN CALCIUM 10 MG PO TABS: 10.0000 mg | ORAL_TABLET | Freq: Every day | ORAL | Status: DC

## 2019-12-14 MED ORDER — POTASSIUM CHLORIDE CRYS ER 10 MEQ PO TBCR
10.0000 meq | EXTENDED_RELEASE_TABLET | Freq: Two times a day (BID) | ORAL | Status: DC
  Administered 2019-12-14: 10 meq via ORAL
  Filled 2019-12-14: qty 1

## 2019-12-14 MED ORDER — LATANOPROST 0.005 % OP SOLN: 1.0000 [drp] | Freq: Every day | OPHTHALMIC | Status: DC

## 2019-12-14 MED ORDER — PANTOPRAZOLE SODIUM 40 MG PO TBEC
40.0000 mg | DELAYED_RELEASE_TABLET | Freq: Every day | ORAL | Status: DC
  Administered 2019-12-15 – 2019-12-17 (×3): 40 mg via ORAL
  Filled 2019-12-14 (×3): qty 1

## 2019-12-14 MED ORDER — BUDESONIDE 0.25 MG/2ML IN SUSP
0.2500 mg | Freq: Two times a day (BID) | RESPIRATORY_TRACT | Status: DC
  Administered 2019-12-14 – 2019-12-17 (×5): 0.25 mg via RESPIRATORY_TRACT
  Filled 2019-12-14 (×6): qty 2

## 2019-12-14 MED ORDER — ALBUTEROL SULFATE (2.5 MG/3ML) 0.083% IN NEBU
2.5000 mg | INHALATION_SOLUTION | Freq: Four times a day (QID) | RESPIRATORY_TRACT | Status: DC | PRN
  Administered 2019-12-15: 2.5 mg via RESPIRATORY_TRACT
  Filled 2019-12-14: qty 3

## 2019-12-14 MED ORDER — ONDANSETRON HCL 4 MG/2ML IJ SOLN: 4.0000 mg | Freq: Four times a day (QID) | INTRAMUSCULAR | Status: DC | PRN

## 2019-12-14 MED ORDER — SODIUM CHLORIDE 0.9 % IV SOLN: 250.0000 mL | INTRAVENOUS | Status: DC | PRN

## 2019-12-14 MED ORDER — VALACYCLOVIR HCL 500 MG PO TABS: 1000.0000 mg | ORAL_TABLET | Freq: Every day | ORAL | Status: DC

## 2019-12-14 MED ORDER — ALBUTEROL SULFATE HFA 108 (90 BASE) MCG/ACT IN AERS: 2.0000 | INHALATION_SPRAY | Freq: Four times a day (QID) | RESPIRATORY_TRACT | Status: DC

## 2019-12-14 MED ORDER — LATANOPROST 0.005 % OP SOLN
1.0000 [drp] | Freq: Every day | OPHTHALMIC | Status: DC
  Administered 2019-12-14 – 2019-12-15 (×2): 1 [drp] via OPHTHALMIC
  Filled 2019-12-14: qty 2.5

## 2019-12-14 MED ORDER — GUAIFENESIN ER 600 MG PO TB12: 600.0000 mg | ORAL_TABLET | Freq: Every day | ORAL | Status: DC

## 2019-12-14 MED ORDER — ARFORMOTEROL TARTRATE 15 MCG/2ML IN NEBU: 15.0000 ug | INHALATION_SOLUTION | Freq: Two times a day (BID) | RESPIRATORY_TRACT | Status: DC

## 2019-12-14 MED ORDER — BRINZOLAMIDE 1 % OP SUSP
1.0000 [drp] | Freq: Two times a day (BID) | OPHTHALMIC | Status: DC
  Administered 2019-12-14 – 2019-12-15 (×2): 1 [drp] via OPHTHALMIC
  Filled 2019-12-14: qty 10

## 2019-12-14 MED ORDER — VALACYCLOVIR HCL 500 MG PO TABS
1000.0000 mg | ORAL_TABLET | Freq: Every day | ORAL | Status: DC
  Administered 2019-12-15 – 2019-12-17 (×3): 1000 mg via ORAL
  Filled 2019-12-14 (×3): qty 2

## 2019-12-14 MED ORDER — DIGOXIN 0.0625 MG HALF TABLET: 0.1250 mg | ORAL_TABLET | Freq: Every day | ORAL | Status: DC

## 2019-12-14 MED ORDER — PANTOPRAZOLE SODIUM 20 MG PO TBEC: 40.0000 mg | DELAYED_RELEASE_TABLET | Freq: Every day | ORAL | Status: DC

## 2019-12-14 MED ORDER — FLUOROMETHOLONE 0.1 % OP OINT: TOPICAL_OINTMENT | Freq: Every day | OPHTHALMIC | Status: DC

## 2019-12-14 MED ORDER — ATORVASTATIN CALCIUM 10 MG PO TABS
10.0000 mg | ORAL_TABLET | Freq: Every evening | ORAL | Status: DC
  Administered 2019-12-14 – 2019-12-16 (×3): 10 mg via ORAL
  Filled 2019-12-14 (×3): qty 1

## 2019-12-14 MED ORDER — FUROSEMIDE 10 MG/ML IJ SOLN: 40.0000 mg | Freq: Two times a day (BID) | INTRAMUSCULAR | Status: DC

## 2019-12-14 MED ORDER — SODIUM CHLORIDE 0.9% FLUSH
3.0000 mL | Freq: Two times a day (BID) | INTRAVENOUS | Status: DC
  Administered 2019-12-14 – 2019-12-17 (×5): 3 mL via INTRAVENOUS

## 2019-12-14 MED ORDER — POTASSIUM CHLORIDE CRYS ER 10 MEQ PO TBCR: 20.0000 meq | EXTENDED_RELEASE_TABLET | Freq: Two times a day (BID) | ORAL | Status: DC

## 2019-12-14 MED ORDER — SODIUM CHLORIDE 0.9 % IV SOLN: 4.0000 mg | Freq: Four times a day (QID) | INTRAVENOUS | Status: DC | PRN

## 2019-12-14 MED ORDER — SODIUM CHLORIDE 0.9% FLUSH: 3.0000 mL | Freq: Two times a day (BID) | INTRAVENOUS | Status: DC

## 2019-12-14 MED ORDER — DILTIAZEM HCL 30 MG PO TABS: 60.0000 mg | ORAL_TABLET | Freq: Two times a day (BID) | ORAL | Status: DC

## 2019-12-14 MED ORDER — IPRATROPIUM BROMIDE 0.02 % IN SOLN
0.5000 mg | Freq: Three times a day (TID) | RESPIRATORY_TRACT | Status: DC
  Administered 2019-12-14 – 2019-12-15 (×2): 0.5 mg via RESPIRATORY_TRACT
  Filled 2019-12-14 (×2): qty 2.5

## 2019-12-14 MED ORDER — IPRATROPIUM BROMIDE 0.02 % IN SOLN: 0.5000 mg | Freq: Four times a day (QID) | RESPIRATORY_TRACT | Status: DC | PRN

## 2019-12-14 MED ORDER — GUAIFENESIN ER 600 MG PO TB12
600.0000 mg | ORAL_TABLET | Freq: Every day | ORAL | Status: DC
  Administered 2019-12-15 – 2019-12-17 (×3): 600 mg via ORAL
  Filled 2019-12-14 (×3): qty 1

## 2019-12-14 MED ORDER — ARFORMOTEROL TARTRATE 15 MCG/2ML IN NEBU
15.0000 ug | INHALATION_SOLUTION | Freq: Two times a day (BID) | RESPIRATORY_TRACT | Status: DC
  Administered 2019-12-14 – 2019-12-17 (×6): 15 ug via RESPIRATORY_TRACT
  Filled 2019-12-14 (×5): qty 2

## 2019-12-14 MED ORDER — ACETAMINOPHEN 325 MG PO TABS
650.0000 mg | ORAL_TABLET | ORAL | Status: DC | PRN
  Administered 2019-12-15 – 2019-12-16 (×2): 650 mg via ORAL
  Filled 2019-12-14 (×2): qty 2

## 2019-12-14 NOTE — Progress Notes (Signed)
   Dr. Stanford Breed requested collaboration writing orders. Orders were entered per our discussion. I also called into the patient to clarify her home med rec. It appears that when office APP tried to enter admit orders it got filed into her home meds. I called into room to clarify med rec and pharmacy team will also be adjusting home med list. Home meds re-ordered per discussion with the patient of last doses. Pharmacist is also helping enter [place] components since we do not have on formulary. Of note, she also clarified she is not on a baby aspirin (h/o ICH). We also had discussion about code status since it is required on admit orders and she is very clear she wishes to be DNR/DNI.  Rhys Anchondo PA-C

## 2019-12-14 NOTE — H&P (Addendum)
12/13/2019 1:54 PM  [facility]  [address]  Office [phone] Fax [phone]  Notice: This dictation was prepared with Dragon dictation along with smaller phrase technology. Any transcriptional errors that result from this process are unintentional and may not be corrected upon review.  Date: 12/13/2019  ID: [name], DOB [birth year], MRN [id number]   PCP: Janith Lima, MD  Cardiologist: Lubertha South  No chief complaint on file.   History of Present Illness:   [name] is a 85 y.o. female who presents for ongoing assessment and management of chronic atrial fibrillation not on anticoagulation, this is due to history of intracranial hemorrhage in 0630, chronic diastolic heart failure, hypertension, with other history to include COPD, GERD, and breast cancer.  She was last seen in the office by Kathyrn Sheriff, PA, on 12/10/2019 with complaints of lower extremity edema. She was evaluated for a watchman device however the patient declined. She is followed by pulmonology for COPD.  On that office visit she was noted to be visibly short of breath despite wearing O2, and was found to have worsening lower extremity edema. The patient did not have orthopnea but was more comfortable sleeping in a recliner. In the office her O2 sat was 16% but was uncertain if this was accurate.  At that office visit labs were drawn to include BNP, CBC, and BMET. Echocardiogram and chest x-ray was also ordered. Patient did have a soft blood pressure and therefore Lasix was increased to 20 mg daily to 40 mg daily, potassium was added at 10 mEq with increased dose. She is here for close follow-up to evaluate her response to medication. She was advised to go to the ED if breathing status worsened.  Review of labs revealed elevated BNP of 3745. After review of labs the patient was increased to Lasix 40 mg twice daily. She was advised to take additional doses of  potassium when she took the second dose of Lasix in the evening. Chest x-ray completed on 12/11/2019 revealed CHF with interstitial edema and effusions which were slightly worsened. Echocardiogram has been scheduled for 12/10/2019. She has not yet had her echo, scheduled for 12/14/2019.  She remains dyspneic, had only lost 3 lbs, and is becoming very frail. She is on 02 via [place]. She is actually feeling worse instead of better.      Past Medical History:  Diagnosis Date  . Arthritis   . COPD (chronic obstructive pulmonary disease) ([place])   . Dyspnea on exertion   . GERD (gastroesophageal reflux disease)   . Glaucoma of both eyes   . History of atrial fibrillation without current medication 2010   POST SURG 2010-- PER PT NO ISSUES SINCE  . History of breast cancer    DX DCIS IN 2004-- S/P RIGHT MASTECTOMY , NO CHEMORADIATION--- NO RECURRENCE  . History of CHF (congestive heart failure)    POST SURG 2010  . History of hypertension   . History of shingles    02/ 2015-- back of neck and left flank-- no residual pain  . ICH (intracerebral hemorrhage) ([place]) 02/05/2015  . Nephrolithiasis   . Recurrent bladder papillary carcinoma ([place]) first dx 06/ 2014   s/p turbt's and instillation mitomycin c (chemo)//dx again in 2015-different type  . Stroke [facility])         Past Surgical History:  Procedure Laterality Date  . CATARACT EXTRACTION W/ INTRAOCULAR LENS IMPLANT, BILATERAL    . CRANIECTOMY N/A 02/05/2015  Procedure: SUBOCCIPITAL CRANIECTOMY EVACUATION OF HEMATOMA; Surgeon: Consuella Lose, MD; Location: [place] [facility]; Service: Neurosurgery; Laterality: N/A;  . CYSTOSCOPY W/ RETROGRADES Bilateral 08/11/2014   Procedure: CYSTOSCOPY WITH BILATERAL RETROGRADE PYELOGRAM AND MITOMYCIN INSTILLATION; Surgeon: Alexis Frock, MD; Location: [facility]; Service: Urology; Laterality: Bilateral;  . CYSTOSCOPY W/ URETERAL STENT PLACEMENT Bilateral 03/26/2013   Procedure: CYSTOSCOPY WITH BILATERAL  RETROGRADE PYELOGRAM/URETERAL STENT PLACEMENT; Surgeon: Alexis Frock, MD; Location: [facility]; Service: Urology; Laterality: Bilateral;  . CYSTOSCOPY W/ URETERAL STENT PLACEMENT Left 05/13/2013   Procedure: CYSTOSCOPY WITH RETROGRADE PYELOGRAM/URETERAL STENT PLACEMENT STENT EXCHANGE; Surgeon: Alexis Frock, MD; Location: [facility]; Service: Urology; Laterality: Left;  . PARTIAL MASTECTOMY WITH NEEDLE LOCALIZATION Right 01-14-2003   DCIS  . REMOVAL CYST LEFT HAND  2013  . TOTAL KNEE ARTHROPLASTY Right 03-22-2009  . TOTAL MASTECTOMY Right 02-03-2003   W/ SLN BX AND POST 03-01-2003 EVACUATION HEMATOMA  . TOTAL MASTECTOMY Left 05/04/2019   Procedure: LEFT TOTAL MASTECTOMY; Surgeon: Fanny Skates, MD; Location: [place]; Service: General; Laterality: Left;  . TRANSTHORACIC ECHOCARDIOGRAM  03-25-2009   MILD LVF/ EF 70-80%/ MILD INCREASE SYSTOLIC PULMONARY PRESSURE  . TRANSURETHRAL RESECTION OF BLADDER TUMOR WITH GYRUS (TURBT-GYRUS) N/A 03/26/2013   Procedure: TRANSURETHRAL RESECTION OF BLADDER TUMOR WITH GYRUS (TURBT-GYRUS); Surgeon: Alexis Frock, MD; Location: [facility]; Service: Urology; Laterality: N/A;  . TRANSURETHRAL RESECTION OF BLADDER TUMOR WITH GYRUS (TURBT-GYRUS) N/A 05/13/2013   Procedure: TRANSURETHRAL RESECTION OF BLADDER TUMOR WITH GYRUS (TURBT-GYRUS) RE-STAGING TRANSURETHRAL RESECTION OF BLADDER TUMOR, LEFT RETROGRADE PYELOGRAM AND STENT EXCHANGE; Surgeon: Alexis Frock, MD; Location: [facility]; Service: Urology; Laterality: N/A;  . TRANSURETHRAL RESECTION OF BLADDER TUMOR WITH GYRUS (TURBT-GYRUS) N/A 08/11/2014   Procedure: TRANSURETHRAL RESECTION OF BLADDER TUMOR WITH GYRUS (TURBT-GYRUS); Surgeon: Alexis Frock, MD; Location: [facility]; Service: Urology; Laterality: N/A;  . TUBAL LIGATION           Current Outpatient Medications  Medication Sig Dispense Refill  . albuterol (VENTOLIN HFA) 108 (90  Base) MCG/ACT inhaler Inhale 2 puffs into the lungs every 6 (six) hours as needed for wheezing or shortness of breath. 8 g 2  . atorvastatin (LIPITOR) 10 MG tablet TAKE ONE TABLET DAILY AT 6PM 90 tablet 0  . BROVANA 15 MCG/2ML NEBU USE 1 VIAL IN NEBULIZER TWICE DAILY - (Morning and Evening) 60 mL 5  . budesonide (PULMICORT) 0.5 MG/2ML nebulizer solution 2ML BY NEBULIZER TWICE DAILY 120 mL 5  . digoxin (LANOXIN) 0.125 MG tablet Take 1 tablet (125 mcg total) by mouth daily. 90 tablet 0  . diltiazem (CARDIZEM CD) 240 MG 24 hr capsule TAKE ONE CAPSULE EACH DAY 90 capsule 0  . fluorometholone (FML) 0.1 % ophthalmic suspension Place 1 drop into both eyes daily.     . furosemide (LASIX) 40 MG tablet Take 1 tablet (40 mg total) by mouth daily. 90 tablet 3  . guaiFENesin (MUCINEX) 600 MG 12 hr tablet Take 600 mg by mouth daily.    Marland Kitchen ipratropium (ATROVENT) 0.02 % nebulizer solution Take 2.5 mLs (0.5 mg total) by nebulization 3 (three) times daily. DX: J44.9 225 mL 5  . latanoprost (XALATAN) 0.005 % ophthalmic solution Place 1 drop into both eyes at bedtime. Reported on 11/09/2015    . OXYGEN Inhale 2 L into the lungs continuous.     . pantoprazole (PROTONIX) 40 MG tablet TAKE ONE TABLET DAILY 90 tablet 1  . potassium chloride (KLOR-CON M10) 10 MEQ tablet Take 1 tablet (  10 mEq total) by mouth 2 (two) times daily. 90 tablet 3  . SHINGRIX injection     . SIMBRINZA 1-0.2 % SUSP Place 1 drop into both eyes 2 (two) times daily.   99  . valACYclovir (VALTREX) 1000 MG tablet Take 1,000 mg by mouth daily.      No current facility-administered medications for this visit.  Allergies: Heparin and Morphine and related  Social History: The patient reports that she quit smoking about 16 years ago. Her smoking use included cigarettes. She has a 40.00 pack-year smoking history. She has never used smokeless tobacco. She reports that she does not drink alcohol or use drugs.  Family History: The patient's family history  includes Breast cancer (age of onset: 34) in her maternal aunt; Breast cancer (age of onset: 19) in her daughter; Breast cancer (age of onset: 38) in an other family member; Cancer in her maternal aunt; Colon cancer in her maternal grandmother; Colon cancer (age of onset: 45) in her mother; Prostate cancer in her paternal grandfather; Stroke in her maternal grandmother and paternal grandmother.  ROS: All other systems are reviewed and negative. Unless otherwise mentioned in H&P  PHYSICAL EXAM:  VS: LMP 12/03/2002 , BMI There is no height or weight on file to calculate BMI.  GEN: Well nourished, well developed, in no acute distress  HEENT: normal  Neck: no JVD, carotid bruits, or masses  Cardiac: RRR; no murmurs, rubs, or gallops, 2+ pitting edema up to the knees bilaterally with erythema bilaterally of the lower extremities.  Respiratory: Clear to auscultation bilaterally, wearing O2 via nasal cannula at 3 L, dyspneic at rest  GI: soft, nontender, nondistended, + BS  MS: no deformity or atrophy  Skin: warm and dry, no rash, pale neuro: Strength and sensation are intact  Psych: euthymic mood, full affect  EKG: EKG is not ordered today.  Recent Labs:  04/08/2019: ALT 31  12/10/2019: BUN 17; Creatinine, Ser 0.82; Hemoglobin 13.5; NT-Pro BNP 3,745; Platelets 278; Potassium 4.2; Sodium 142  Lipid Panel         Component Value Date/Time   CHOL 121 04/19/2017 0936   TRIG 114 04/19/2017 0936   HDL 46 04/19/2017 0936   CHOLHDL 2.6 04/19/2017 0936   CHOLHDL 3 05/09/2016 1216   VLDL 36.8 05/09/2016 1216   LDLCALC 52 04/19/2017 0936      Wt Readings from Last 3 Encounters:  12/10/19 160 lb 3.2 oz (72.7 kg)  11/24/19 155 lb 6.4 oz (70.5 kg)  10/27/19 153 lb 3.2 oz (69.5 kg)  Other studies Reviewed:  CXR 12/11/2019  FINDINGS: Number ready to go home never nurse a ready to go vision that is  Cardiomegaly and aortic atherosclerosis. Pulmonary venous  hypertension with interstitial pulmonary edema.  Small effusions  layering dependently with dependent atelectasis. Findings slightly  worsened compared to the study of February.  Echocardiogram 02/07/2015  Procedure narrative: Transthoracic echocardiography. Image  quality was poor. The study was technically difficult, as a  result of poor acoustic windows, poor sound wave transmission,  and restricted patient mobility.  - Left ventricle: The cavity size was normal. Wall thickness was  increased in a pattern of mild LVH. Systolic function was  vigorous. The estimated ejection fraction was in the range of 65%  to 70%. Wall motion was normal; there were no regional wall  motion abnormalities. Doppler parameters are consistent with  abnormal left ventricular relaxation (grade 1 diastolic  dysfunction). The E/e&' ratio is between 8-15, suggesting  indeterminate LV filling pressure.  - Aortic valve: Poorly visualized. There was no stenosis. There was  no regurgitation.  - Mitral valve: Calcified annulus. There was trivial regurgitation.  - Left atrium: The atrium was normal in size.  - Right atrium: The atrium was mildly dilated.  IMPRESSION:  Congestive heart failure with interstitial edema and effusions,  slightly worsened.  ASSESSMENT AND PLAN:  1. Acute on chronic decompensated diastolic CHF: She continues to be very dyspneic with volume overload despite outpatient treatment with increased dose of Lasix. She is only lost 3 pounds. She continues to have significant lower extremity edema. Dyspnea remains prominent despite O2 via nasal cannula.  Chest x-ray and BNP last week indicated decompensated CHF. I will have her admitted for IV diuresis. Question whether or not thoracentesis is necessary with bilateral pleural effusions. Will defer to Dr. Stanford Breed on his evaluation and follow-up chest x-ray. Echocardiogram was scheduled as an outpatient on 12/15/2019 but I will get this ordered as an inpatient.  I have discussed with his doctor Margaretann Loveless  who is currently DOD in [employer] today. She has had an opportunity to speak with the patient and examine her as well. She is in agreement with my assessment and plan to have this patient admitted. Dr. Stanford Breed has been notified as well.  2. Hypotension: I am concerned about her blood pressure being so soft on diltiazem with IV Lasix. I will significantly reduce diltiazem to 30 mg twice daily initially to allow for better blood pressure status during diuresis. This of course can be titrated while hospitalized if blood pressure becomes elevated.  3. O2 dependent COPD: Daughter endorses that her breathing status is worsened compared to baseline with her COPD. We will check Covid test on admission along with BN P and repeat her chest x-ray.  4. Atrial fibrillation: She remains on digoxin 0.125 mg daily, and diltiazem. Will reduce diltiazem dose to allow for better blood pressure in the setting of IV diuresis to prevent hypotension worsening. She is currently not on anticoagulation with history of cerebellar hemorrhage. We will check a CBC.  5. Glaucoma: The patient may take her home glaucoma medications.  Current medicines are reviewed at length with the patient today. I have spent 45 minutes dedicated to the care of this patient on the date of this encounter to include pre-visit review of records, assessment, management and diagnostic testing,with shared decision making.  Labs/ tests ordered today include: Hospital admission with labs per orders.   Phill Myron. Lawrence DNP, ANP, AACC   As above, patient seen and examined.  Briefly she is an 85 year old female with past medical history of permanent atrial fibrillation, prior intracranial hemorrhage, hypertension, chronic diastolic congestive heart failure, COPD, breast cancer with acute on chronic diastolic congestive heart failure.  Patient has had increasing dyspnea on exertion for the last 6 to 8 weeks.  She also has developed worsening bilateral  lower extremity edema.  She has mild orthopnea but denies PND, chest pain or syncope.  She recently had oral Lasix increased but her edema did not improve.  She returned today for further evaluation.  On physical exam the patient is markedly volume overloaded with 3+ bilateral lower extremity edema.  She has diminished breath sounds throughout her lungs.  Cardiovascular Sam reveals an irregular rhythm.  Chest x-ray shows CHF with interstitial edema and effusions.  Recent proBNP 3745.  1 acute on chronic diastolic congestive heart failure-patient is markedly volume overloaded.  Will treat with Lasix 40  mg IV twice daily.  Follow potassium and renal function.  Repeat echocardiogram.  2 permanent atrial fibrillation-we will continue digoxin for rate control.  Change Cardizem to 30 mg by mouth every 6 hours and follow heart rate.  Blood pressure has been borderline and I would like to reduce Cardizem if possible. CHADSvasc 7.  However she is not an anticoagulation candidate given history of spontaneous intracranial hemorrhage.  She also declined watchman device.  We will continue aspirin.  Will repeat TSH.  3 COPD-significant.  Continue preadmission bronchodilators.  She is also on home oxygen.  Kirk Ruths, MD

## 2019-12-14 NOTE — Progress Notes (Signed)
Patient was placed on HFNC for saturation in the 80's

## 2019-12-14 NOTE — H&P (Signed)
Date:  12/13/2019   ID:  [name], DOB 05/09/1935, MRN [id number]  PCP:  Janith Lima, MD  Cardiologist:  Lubertha South  No chief complaint on file.    History of Present Illness: [name] is a 85 y.o. female who presents for ongoing assessment and management of chronic atrial fibrillation not on anticoagulation, this is due to history of intracranial hemorrhage in 6073, chronic diastolic heart failure, hypertension, with other history to include COPD, GERD, and breast cancer.  She was last seen in the office by Kathyrn Sheriff, PA, on 12/10/2019 with complaints of lower extremity edema. She was evaluated for a watchman device however the patient declined. She is followed by pulmonology for COPD.  On that office visit she was noted to be visibly short of breath despite wearing O2, and was found to have worsening lower extremity edema. The patient did not have orthopnea but was more comfortable sleeping in a recliner. In the office her O2 sat was 71% but was uncertain if this was accurate.  At that office visit labs were drawn to include BNP, CBC, and BMET. Echocardiogram and chest x-ray was also ordered. Patient did have a soft blood pressure and therefore Lasix was increased to 20 mg daily to 40 mg daily, potassium was added at 10 mEq with increased dose. She is here for close follow-up to evaluate her response to medication. She was advised to go to the ED if breathing status worsened.  Review of labs revealed elevated BNP of 3745. After review of labs the patient was increased to Lasix 40 mg twice daily. She was advised to take additional doses of potassium when she took the second dose of Lasix in the evening. Chest x-ray completed on 12/11/2019 revealed CHF with interstitial edema and effusions which were slightly worsened. Echocardiogram has been scheduled for 12/10/2019.  She has not yet had her echo, scheduled for 12/14/2019.   She remains dyspneic, had only lost 3 lbs, and is  becoming very frail. She is on 02 via [place]. She is actually feeling worse instead of better.   Past Medical History:  Diagnosis Date  . Arthritis   . COPD (chronic obstructive pulmonary disease) ([place])   . Dyspnea on exertion   . GERD (gastroesophageal reflux disease)   . Glaucoma of both eyes   . History of atrial fibrillation without current medication 2010   POST SURG 2010--  PER PT NO ISSUES SINCE  . History of breast cancer    DX DCIS IN 2004--  S/P RIGHT MASTECTOMY , NO CHEMORADIATION---  NO RECURRENCE  . History of CHF (congestive heart failure)    POST SURG 2010  . History of hypertension   . History of shingles    02/ 2015--  back of neck and left flank--  no residual pain  . ICH (intracerebral hemorrhage) ([place]) 02/05/2015  . Nephrolithiasis   . Recurrent bladder papillary carcinoma ([place]) first dx 06/ 2014   s/p  turbt's  and instillation mitomycin c (chemo)//dx again in 2015-different type  . Stroke [facility])     Past Surgical History:  Procedure Laterality Date  . CATARACT EXTRACTION W/ INTRAOCULAR LENS  IMPLANT, BILATERAL    . CRANIECTOMY N/A 02/05/2015   Procedure: SUBOCCIPITAL CRANIECTOMY EVACUATION OF HEMATOMA;  Surgeon: Consuella Lose, MD;  Location: [place] [facility];  Service: Neurosurgery;  Laterality: N/A;  . CYSTOSCOPY W/ RETROGRADES Bilateral 08/11/2014   Procedure: CYSTOSCOPY WITH BILATERAL RETROGRADE PYELOGRAM AND MITOMYCIN INSTILLATION;  Surgeon: Alexis Frock, MD;  Location: [place];  Service: Urology;  Laterality: Bilateral;  . CYSTOSCOPY W/ URETERAL STENT PLACEMENT Bilateral 03/26/2013   Procedure: CYSTOSCOPY WITH BILATERAL RETROGRADE PYELOGRAM/URETERAL STENT PLACEMENT;  Surgeon: Alexis Frock, MD;  Location: [facility];  Service: Urology;  Laterality: Bilateral;  . CYSTOSCOPY W/ URETERAL STENT PLACEMENT Left 05/13/2013   Procedure: CYSTOSCOPY WITH RETROGRADE PYELOGRAM/URETERAL STENT PLACEMENT STENT EXCHANGE;  Surgeon: Alexis Frock, MD;  Location: [facility];  Service: Urology;  Laterality: Left;  . PARTIAL MASTECTOMY WITH NEEDLE LOCALIZATION Right 01-14-2003   DCIS  . REMOVAL CYST LEFT HAND  2013  . TOTAL KNEE ARTHROPLASTY Right 03-22-2009  . TOTAL MASTECTOMY Right 02-03-2003   W/ SLN BX  AND  POST 03-01-2003 EVACUATION HEMATOMA  . TOTAL MASTECTOMY Left 05/04/2019   Procedure: LEFT TOTAL MASTECTOMY;  Surgeon: Fanny Skates, MD;  Location: [place];  Service: General;  Laterality: Left;  . TRANSTHORACIC ECHOCARDIOGRAM  03-25-2009   MILD LVF/ EF 70-80%/ MILD INCREASE SYSTOLIC PULMONARY  PRESSURE  . TRANSURETHRAL RESECTION OF BLADDER TUMOR WITH GYRUS (TURBT-GYRUS) N/A 03/26/2013   Procedure: TRANSURETHRAL RESECTION OF BLADDER TUMOR WITH GYRUS (TURBT-GYRUS);  Surgeon: Alexis Frock, MD;  Location: [facility]  Service: Urology;  Laterality: N/A;  . TRANSURETHRAL RESECTION OF BLADDER TUMOR WITH GYRUS (TURBT-GYRUS) N/A 05/13/2013   Procedure: TRANSURETHRAL RESECTION OF BLADDER TUMOR WITH GYRUS (TURBT-GYRUS)  RE-STAGING TRANSURETHRAL RESECTION OF BLADDER TUMOR, LEFT RETROGRADE PYELOGRAM AND STENT EXCHANGE;  Surgeon: Alexis Frock, MD;  Location: [facility];  Service: Urology;  Laterality: N/A;  . TRANSURETHRAL RESECTION OF BLADDER TUMOR WITH GYRUS (TURBT-GYRUS) N/A 08/11/2014   Procedure: TRANSURETHRAL RESECTION OF BLADDER TUMOR WITH GYRUS (TURBT-GYRUS);  Surgeon: Alexis Frock, MD;  Location: [facility];  Service: Urology;  Laterality: N/A;  . TUBAL LIGATION       Current Outpatient Medications  Medication Sig Dispense Refill  . albuterol (VENTOLIN HFA) 108 (90 Base) MCG/ACT inhaler Inhale 2 puffs into the lungs every 6 (six) hours as needed for wheezing or shortness of breath. 8 g 2  . atorvastatin (LIPITOR) 10 MG tablet TAKE ONE TABLET DAILY AT 6PM 90 tablet 0  . BROVANA 15 MCG/2ML NEBU USE 1 VIAL  IN  NEBULIZER TWICE  DAILY - (Morning and Evening) 60 mL  5  . budesonide (PULMICORT) 0.5 MG/2ML nebulizer solution 2ML BY NEBULIZER TWICE DAILY 120 mL 5  . digoxin (LANOXIN) 0.125 MG tablet Take 1 tablet (125 mcg total) by mouth daily. 90 tablet 0  . diltiazem (CARDIZEM CD) 240 MG 24 hr capsule TAKE ONE CAPSULE EACH DAY 90 capsule 0  . fluorometholone (FML) 0.1 % ophthalmic suspension Place 1 drop into both eyes daily.     . furosemide (LASIX) 40 MG tablet Take 1 tablet (40 mg total) by mouth daily. 90 tablet 3  . guaiFENesin (MUCINEX) 600 MG 12 hr tablet Take 600 mg by mouth daily.    Marland Kitchen ipratropium (ATROVENT) 0.02 % nebulizer solution Take 2.5 mLs (0.5 mg total) by nebulization 3 (three) times daily. DX: J44.9 225 mL 5  . latanoprost (XALATAN) 0.005 % ophthalmic solution Place 1 drop into both eyes at bedtime. Reported on 11/09/2015    . OXYGEN Inhale 2 L into the lungs continuous.     . pantoprazole (PROTONIX) 40 MG tablet TAKE ONE TABLET DAILY 90 tablet 1  . potassium chloride (KLOR-CON M10) 10 MEQ tablet Take 1 tablet (10 mEq total) by mouth 2 (two) times daily. 90 tablet  3  . SHINGRIX injection     . SIMBRINZA 1-0.2 % SUSP Place 1 drop into both eyes 2 (two) times daily.   99  . valACYclovir (VALTREX) 1000 MG tablet Take 1,000 mg by mouth daily.      No current facility-administered medications for this visit.    Allergies:   Heparin and Morphine and related    Social History:  The patient  reports that she quit smoking about 16 years ago. Her smoking use included cigarettes. She has a 40.00 pack-year smoking history. She has never used smokeless tobacco. She reports that she does not drink alcohol or use drugs.   Family History:  The patient's family history includes Breast cancer (age of onset: 96) in her maternal aunt; Breast cancer (age of onset: 65) in her daughter; Breast cancer (age of onset: 32) in an other family member; Cancer in her maternal aunt; Colon cancer in her maternal grandmother; Colon cancer (age of onset: 57) in her mother;  Prostate cancer in her paternal grandfather; Stroke in her maternal grandmother and paternal grandmother.    ROS: All other systems are reviewed and negative. Unless otherwise mentioned in H&P    PHYSICAL EXAM: VS:  LMP 12/03/2002  , BMI There is no height or weight on file to calculate BMI. GEN: Well nourished, well developed, in no acute distress HEENT: normal Neck: no JVD, carotid bruits, or masses Cardiac: RRR; no murmurs, rubs, or gallops, 2+ pitting edema up to the knees bilaterally with erythema bilaterally of the lower extremities.  Respiratory:  Clear to auscultation bilaterally, wearing O2 via nasal cannula at 3 L, dyspneic at rest GI: soft, nontender, nondistended, + BS MS: no deformity or atrophy Skin: warm and dry, no rash, pale neuro:  Strength and sensation are intact Psych: euthymic mood, full affect   EKG:  EKG is not ordered today.  Recent Labs: 04/08/2019: ALT 31 12/10/2019: BUN 17; Creatinine, Ser 0.82; Hemoglobin 13.5; NT-Pro BNP 3,745; Platelets 278; Potassium 4.2; Sodium 142    Lipid Panel    Component Value Date/Time   CHOL 121 04/19/2017 0936   TRIG 114 04/19/2017 0936   HDL 46 04/19/2017 0936   CHOLHDL 2.6 04/19/2017 0936   CHOLHDL 3 05/09/2016 1216   VLDL 36.8 05/09/2016 1216   LDLCALC 52 04/19/2017 0936      Wt Readings from Last 3 Encounters:  12/10/19 160 lb 3.2 oz (72.7 kg)  11/24/19 155 lb 6.4 oz (70.5 kg)  10/27/19 153 lb 3.2 oz (69.5 kg)      Other studies Reviewed: CXR 12/11/2019 FINDINGS: Number ready to go home never nurse a ready to go vision that is Cardiomegaly and aortic atherosclerosis. Pulmonary venous hypertension with interstitial pulmonary edema. Small effusions layering dependently with dependent atelectasis. Findings slightly worsened compared to the study of February.  Echocardiogram 02/07/2015 Procedure narrative: Transthoracic echocardiography. Image  quality was poor. The study was technically difficult, as a   result of poor acoustic windows, poor sound wave transmission,  and restricted patient mobility.  - Left ventricle: The cavity size was normal. Wall thickness was  increased in a pattern of mild LVH. Systolic function was  vigorous. The estimated ejection fraction was in the range of 65%  to 70%. Wall motion was normal; there were no regional wall  motion abnormalities. Doppler parameters are consistent with  abnormal left ventricular relaxation (grade 1 diastolic  dysfunction). The E/e&' ratio is between 8-15, suggesting  indeterminate LV filling pressure.  - Aortic valve:  Poorly visualized. There was no stenosis. There was  no regurgitation.  - Mitral valve: Calcified annulus. There was trivial regurgitation.  - Left atrium: The atrium was normal in size.  - Right atrium: The atrium was mildly dilated.   IMPRESSION: Congestive heart failure with interstitial edema and effusions, slightly worsened.    ASSESSMENT AND PLAN:  1.  Acute on chronic decompensated diastolic CHF: She continues to be very dyspneic with volume overload despite outpatient treatment with increased dose of Lasix.  She is only lost 3 pounds.  She continues to have significant lower extremity edema.  Dyspnea remains prominent despite O2 via nasal cannula.  Chest x-ray and BNP last week indicated decompensated CHF.  I will have her admitted for IV diuresis.  Question whether or not thoracentesis is necessary with bilateral pleural effusions.  Will defer to Dr. Stanford Breed on his evaluation and follow-up chest x-ray.  Echocardiogram was scheduled as an outpatient on 12/15/2019 but I will get this ordered as an inpatient.  I have discussed with his doctor Margaretann Loveless who is currently DOD in [employer] today.  She has had an opportunity to speak with the patient and examine her as well.  She is in agreement with my assessment and plan to have this patient admitted.  Dr. Stanford Breed has been notified as  well.  2.  Hypotension: I am concerned about her blood pressure being so soft on diltiazem with IV Lasix.  I will significantly reduce diltiazem to 30 mg twice daily initially to allow for better blood pressure status during diuresis.  This of course can be titrated while hospitalized if blood pressure becomes elevated.  3.  O2 dependent COPD: Daughter endorses that her breathing status is worsened compared to baseline with her COPD.  We will check Covid test on admission along with BN P and repeat her chest x-ray.  4.  Atrial fibrillation: She remains on digoxin 0.125 mg daily, and diltiazem.  Will reduce diltiazem dose to allow for better blood pressure in the setting of IV diuresis to prevent hypotension worsening.  She is currently not on anticoagulation with history of cerebellar hemorrhage.  We will check a CBC.  5.  Glaucoma: The patient may take her home glaucoma medications.    Current medicines are reviewed at length with the patient today.  I have spent 45 minutes dedicated to the care of this patient on the date of this encounter to include pre-visit review of records, assessment, management and diagnostic testing,with shared decision making.  Labs/ tests ordered today include: Hospital admission with labs per orders.  Phill Myron. West Pugh, ANP, AACC   12/13/2019 1:54 PM    [facility] [place] [address] Office [phone] Fax [phone]  Notice: This dictation was prepared with Dragon dictation along with smaller phrase technology. Any transcriptional errors that result from this process are unintentional and may not be corrected upon review.

## 2019-12-15 ENCOUNTER — Inpatient Hospital Stay (HOSPITAL_COMMUNITY): Payer: Medicare Other

## 2019-12-15 ENCOUNTER — Ambulatory Visit (HOSPITAL_COMMUNITY): Payer: Medicare Other

## 2019-12-15 DIAGNOSIS — I5033 Acute on chronic diastolic (congestive) heart failure: Secondary | ICD-10-CM

## 2019-12-15 LAB — DIGOXIN LEVEL: Digoxin Level: 1 ng/mL (ref 0.8–2.0)

## 2019-12-15 LAB — BASIC METABOLIC PANEL
Anion gap: 9 (ref 5–15)
BUN: 14 mg/dL (ref 8–23)
CO2: 33 mmol/L — ABNORMAL HIGH (ref 22–32)
Calcium: 8.6 mg/dL — ABNORMAL LOW (ref 8.9–10.3)
Chloride: 100 mmol/L (ref 98–111)
Creatinine, Ser: 0.78 mg/dL (ref 0.44–1.00)
GFR calc Af Amer: >60 mL/min (ref >60)
GFR calc non Af Amer: >60 mL/min (ref >60)
Glucose, Bld: 94 mg/dL (ref 70–99)
Potassium: 3.9 mmol/L (ref 3.5–5.1)
Sodium: 142 mmol/L (ref 135–145)

## 2019-12-15 LAB — SARS CORONAVIRUS 2 (TAT 6-24 HRS): SARS Coronavirus 2: NEGATIVE

## 2019-12-15 LAB — ECHOCARDIOGRAM COMPLETE
Height: 66 in
Weight: 2497.37 oz

## 2019-12-15 LAB — MAGNESIUM: Magnesium: 2 mg/dL (ref 1.7–2.4)

## 2019-12-15 MED ORDER — FUROSEMIDE 10 MG/ML IJ SOLN
60.0000 mg | Freq: Two times a day (BID) | INTRAMUSCULAR | Status: DC
  Administered 2019-12-15 (×2): 60 mg via INTRAVENOUS
  Filled 2019-12-15 (×2): qty 6

## 2019-12-15 MED ORDER — IPRATROPIUM BROMIDE 0.02 % IN SOLN
0.5000 mg | Freq: Two times a day (BID) | RESPIRATORY_TRACT | Status: DC
  Administered 2019-12-15 – 2019-12-17 (×4): 0.5 mg via RESPIRATORY_TRACT
  Filled 2019-12-15 (×4): qty 2.5

## 2019-12-15 MED ORDER — POTASSIUM CHLORIDE CRYS ER 20 MEQ PO TBCR
20.0000 meq | EXTENDED_RELEASE_TABLET | Freq: Two times a day (BID) | ORAL | Status: DC
  Administered 2019-12-15 – 2019-12-17 (×5): 20 meq via ORAL
  Filled 2019-12-15 (×5): qty 1

## 2019-12-15 NOTE — Progress Notes (Signed)
  ReDS Clip Diuretic Study Pt study # [id number]  Your patient has been enrolled in the ReDS Clip Diuretic Study   Changes to prescribed diuretics recommended:  Continue lasix IV 60 mg BID Provider contacted: Dr. Stanford Breed Recommendation was accepted by provider.   REDS Clip  READING= 42%  CHEST RULER = 31 Clip Station = C   Orthodema score = 3 Signs/Symptoms Score   Mild edema, no orthopnea 0 No congestion  Moderate edema, no orthopnea 1 Low-grade orthodema/congestion  Severe edema OR orthopnea 2   Moderate edema and orthopnea 3 High-grade orthodema/congestion  Severe edema AND orthopnea 4    Vertis Kelch, PharmD, [facility] PGY2 Cardiology Pharmacy Resident Phone [phone] 12/15/2019       3:39 PM  Please check [email or web address] for unit-specific pharmacist phone numbers

## 2019-12-15 NOTE — Progress Notes (Addendum)
Progress Note  Patient Name: [name] Date of Encounter: 12/15/2019  Primary Cardiologist: Kirk Ruths, MD   Subjective   Patient still noticeably shortness of breath. O2 sats in the low 80's on 5L via HFNC. Occasional palpitations overnight. No chest pain.   Inpatient Medications    Scheduled Meds: . arformoterol  15 mcg Nebulization BID  . atorvastatin  10 mg Oral QPM  . brinzolamide  1 drop Both Eyes BID   And  . brimonidine  1 drop Both Eyes BID  . budesonide (PULMICORT) nebulizer solution  0.25 mg Nebulization BID  . digoxin  0.125 mg Oral Daily  . diltiazem  30 mg Oral Q6H  . fluorometholone   Both Eyes Daily  . furosemide  40 mg Intravenous BID  . guaiFENesin  600 mg Oral Daily  . ipratropium  0.5 mg Nebulization TID  . latanoprost  1 drop Both Eyes QHS  . pantoprazole  40 mg Oral Q0600  . potassium chloride  10 mEq Oral BID  . sodium chloride flush  3 mL Intravenous Q12H  . valACYclovir  1,000 mg Oral Daily   Continuous Infusions: . sodium chloride     PRN Meds: sodium chloride, acetaminophen, albuterol, ondansetron (ZOFRAN) IV, sodium chloride flush   Vital Signs    Vitals:   12/14/19 2031 12/15/19 0009 12/15/19 0534 12/15/19 0604  BP:  104/72 113/79 118/62  Pulse:  81  82  Resp:    20  Temp:  (!) 97.5 F (36.4 C)    TempSrc:  Oral    SpO2: 94% 92% 94% 92%  Weight:        Intake/Output Summary (Last 24 hours) at 12/15/2019 0645 Last data filed at 12/15/2019 0400 Gross per 24 hour  Intake --  Output 1000 ml  Net -1000 ml   Last 3 Weights 12/14/2019 12/14/2019 12/10/2019  Weight (lbs) 157 lb 6.4 oz 157 lb 6.4 oz 160 lb 3.2 oz  Weight (kg) 71.396 kg 71.396 kg 72.666 kg      Telemetry    Atrial fibrillation/PVC with rates in the 70's to 90's. One 18 beat run of non-sustained VT noted.  - Personally Reviewed  ECG    No new ECG tracing. - Personally Reviewed  Physical Exam   GEN: Mildly tachypneic but in no acute distress. On 5L  via HFNC. Neck: Supple. No JVD Cardiac: Irregularly irregular rhythm with normal rate. Possible soft systolic murmur at apex. No rubs or gallops. Radial and distal pedal pulses 2+ and equal bilaterally.  Respiratory: Mildly tachypneic and mild increased work of breathing. Diminished breath sounds throughout and possible mild crackles in bases.  GI: Soft, non-distended, and non-tender. Bowel sounds present.  MS: 2+ pitting edema of bilateral lower extremities extending up to her thighs. No deformity. Skin: Warm and dry. Neuro:  No focal deficits. Psych: Normal affect. Responds appropriately.   Labs    High Sensitivity Troponin:  No results for input(s): TROPONINIHS in the last 720 hours.    Chemistry Recent Labs  Lab 12/10/19 1620 12/14/19 1940 12/15/19 0353  NA 142 142 142  K 4.2 4.6 3.9  CL 99 101 100  CO2 28 33* 33*  GLUCOSE 90 106* 94  BUN _0 CREATININE 0.82 0.84 0.78  CALCIUM 9.0 8.9 8.6*  PROT  --  6.6  --   ALBUMIN  --  3.6  --   AST  --  24  --   ALT  --  16  --   ALKPHOS  --  106  --   BILITOT  --  0.9  --   GFRNONAA 67 >60 >60  GFRAA 77 >60 >60  ANIONGAP  --  8 9     Hematology Recent Labs  Lab 12/10/19 1620 12/14/19 1940  WBC 9.3 9.0  RBC 4.91 4.75  HGB 13.5 13.2  HCT 44.1 43.0  MCV 90 90.5  MCH 27.5 27.8  MCHC 30.6* 30.7  RDW 14.7 15.9*  PLT 278 296    BNP Recent Labs  Lab 12/10/19 1620 12/14/19 1940  BNP  --  738.5*  PROBNP 3,745*  --      DDimer No results for input(s): DDIMER in the last 168 hours.   Radiology    No results found.  Cardiac Studies   Echo pending.  Patient Profile   Ms. Sant is a 85 y.o. female with a history of chronic atrial fibrillation not on anticoagulation, chronic diastolic CHF, intracerebral hemorrhage in 2016, COPD, hypertension, GERD, and breast cancer who was admitted from the office on 12/14/2019 for acute on chronic diastolic CHF after failing outpatient treatment  Assessment & Plan     Acute on Chronic Diastolic CHF - Patient admitted with worsening shortness of breath and significantly lower extremity edema.  - Chest x-ray on 12/11/2019 showed interstitial edema and small layering bilateral effusion with dependent atelectasis.  - Pro BNP 3,745 in office on 12/10/2019.  - BNP 738.5 on admission. - Echo pending. - Started on IV Lasix 46m twice daily. Documented urinary output of 1 L after one dose. Renal function stable. - Continue current dose of IV Lasix.  - If EF is low, will need to discontinued Cardizem and transition to beta-blocker.  - Continue to monitor daily weights, strict I/O's, and renal function.  Permanent Atrial Fibrillation - On Cardizem CD 2448mdaily at home. Changed to short-acting Cardizem 3066mwice daily due to soft BP to allow for more aggressive diuresis.  - TSH normal.  - Continue Digoxin 0.125m17mily. Digoxin level 1.0.   - Not on anticoagulation due to prior intracranial hemorrhage while on Aspirin.   Non-Sustained VT - One 18 beat run of non-sustained VT noted on telemetry.  - Potassium 3.9 today. Goal >4.0.  Will increase K-Dur to 20 mEq twice daily given IV Lasix. - Will check Magnesium.  - On Cardizem as above.  - Continue to monitor.  Hypotensive - BP has been soft but stable. Most recent BP 118/62. - Cardizem reduced to 30mg82mce daily as stated above.  - Continue to monitor closely with diuresis.   COPD on Home O2 - O2 sats in the low 80's on 5L via HFNC this morning. Continue to titrate as needed.  - No active wheezing.  - Continue home inhalers.    For questions or updates, please contact [facility] consult [email or web address] for contact info under        Signed, CalliDarreld McleanC  12/15/2019, 6:45 AM   As above, patient seen and examined.  She remains dyspneic but denies chest pain.  On exam she continues with 3+ bilateral lower extremity edema.  Will increase Lasix to 60 mg IV twice daily.  Follow  potassium and renal function.  Continue low-dose Cardizem and digoxin for rate control.  Can increase Cardizem if needed.  Will avoid beta blockade given severity of COPD.  Await repeat echocardiogram for LV function.  Patient had a short run of nonsustained ventricular tachycardia.  Check magnesium. Kirk Ruths, MD

## 2019-12-15 NOTE — Progress Notes (Signed)
Echocardiogram 2D Echocardiogram has been performed.  Oneal Deputy Sayvion Vigen 12/15/2019, 11:11 AM

## 2019-12-16 ENCOUNTER — Encounter (HOSPITAL_COMMUNITY): Payer: Self-pay | Admitting: Cardiology

## 2019-12-16 LAB — BASIC METABOLIC PANEL
Anion gap: 10 (ref 5–15)
BUN: 14 mg/dL (ref 8–23)
CO2: 35 mmol/L — ABNORMAL HIGH (ref 22–32)
Calcium: 8.8 mg/dL — ABNORMAL LOW (ref 8.9–10.3)
Chloride: 99 mmol/L (ref 98–111)
Creatinine, Ser: 0.91 mg/dL (ref 0.44–1.00)
GFR calc Af Amer: >60 mL/min (ref >60)
GFR calc non Af Amer: 59 mL/min — ABNORMAL LOW (ref >60)
Glucose, Bld: 93 mg/dL (ref 70–99)
Potassium: 4.3 mmol/L (ref 3.5–5.1)
Sodium: 144 mmol/L (ref 135–145)

## 2019-12-16 MED ORDER — DILTIAZEM HCL ER COATED BEADS 120 MG PO CP24
120.0000 mg | ORAL_CAPSULE | Freq: Every day | ORAL | Status: DC
  Administered 2019-12-16 – 2019-12-17 (×2): 120 mg via ORAL
  Filled 2019-12-16 (×2): qty 1

## 2019-12-16 MED ORDER — FUROSEMIDE 10 MG/ML IJ SOLN
40.0000 mg | Freq: Two times a day (BID) | INTRAMUSCULAR | Status: DC
  Administered 2019-12-16: 40 mg via INTRAVENOUS
  Filled 2019-12-16 (×3): qty 4

## 2019-12-16 NOTE — Plan of Care (Signed)

## 2019-12-16 NOTE — Progress Notes (Signed)
Progress Note  Patient Name: [name] Date of Encounter: 12/16/2019  Primary Cardiologist: Kirk Ruths, MD   Subjective   Mild dyspnea; no chest pain  Inpatient Medications    Scheduled Meds: . arformoterol  15 mcg Nebulization BID  . atorvastatin  10 mg Oral QPM  . brinzolamide  1 drop Both Eyes BID   And  . brimonidine  1 drop Both Eyes BID  . budesonide (PULMICORT) nebulizer solution  0.25 mg Nebulization BID  . digoxin  0.125 mg Oral Daily  . diltiazem  30 mg Oral Q6H  . fluorometholone   Both Eyes Daily  . furosemide  60 mg Intravenous BID  . guaiFENesin  600 mg Oral Daily  . ipratropium  0.5 mg Nebulization BID  . latanoprost  1 drop Both Eyes QHS  . pantoprazole  40 mg Oral Q0600  . potassium chloride  20 mEq Oral BID  . sodium chloride flush  3 mL Intravenous Q12H  . valACYclovir  1,000 mg Oral Daily   Continuous Infusions: . sodium chloride     PRN Meds: sodium chloride, acetaminophen, albuterol, ondansetron (ZOFRAN) IV, sodium chloride flush   Vital Signs    Vitals:   12/16/19 0554 12/16/19 0840 12/16/19 0843 12/16/19 0848  BP: 127/82     Pulse: 90     Resp: (!) 22     Temp: 97.9 F (36.6 C)     TempSrc: Oral     SpO2: 97% 93% 100% 100%  Weight:        Intake/Output Summary (Last 24 hours) at 12/16/2019 0854 Last data filed at 12/16/2019 0520 Gross per 24 hour  Intake 600 ml  Output 3925 ml  Net -3325 ml   Last 3 Weights 12/16/2019 12/15/2019 12/14/2019  Weight (lbs) 147 lb 9.6 oz 156 lb 1.4 oz 157 lb 6.4 oz  Weight (kg) 66.951 kg 70.8 kg 71.396 kg      Telemetry    Atrial fibrillation; rate controlled- Personally Reviewed   Physical Exam   GEN: No acute distress.   Neck: supple Cardiac: irregular Respiratory: Diminished BS GI: Soft, nontender, non-distended  MS: 2+ edema Neuro:  Nonfocal  Psych: Normal affect   Labs    High Sensitivity Troponin:  No results for input(s): TROPONINIHS in the last 720 hours.     Chemistry Recent Labs  Lab 12/14/19 1940 12/15/19 0353 12/16/19 0410  NA 142 142 144  K 4.6 3.9 4.3  CL 101 100 99  CO2 33* 33* 35*  GLUCOSE 106* 94 93  BUN _0 CREATININE 0.84 0.78 0.91  CALCIUM 8.9 8.6* 8.8*  PROT 6.6  --   --   ALBUMIN 3.6  --   --   AST 24  --   --   ALT 16  --   --   ALKPHOS 106  --   --   BILITOT 0.9  --   --   GFRNONAA >60 >60 59*  GFRAA >60 >60 >60  ANIONGAP _1 Hematology Recent Labs  Lab 12/10/19 1620 12/14/19 1940  WBC 9.3 9.0  RBC 4.91 4.75  HGB 13.5 13.2  HCT 44.1 43.0  MCV 90 90.5  MCH 27.5 27.8  MCHC 30.6* 30.7  RDW 14.7 15.9*  PLT 278 296    BNP Recent Labs  Lab 12/10/19 1620 12/14/19 1940  BNP  --  738.5*  PROBNP 3,745*  --     Radiology  ECHOCARDIOGRAM COMPLETE  Result Date: 12/15/2019    ECHOCARDIOGRAM REPORT   Patient Name:   [name] Date of Exam: 12/15/2019 Medical Rec #:  [id number]         Height:       66.0 in Accession #:    [id number]        Weight:       156.1 lb Date of Birth:  11-Aug-1935        BSA:          1.800 m Patient Age:    85 years          BP:           97/53 mmHg Patient Gender: F                 HR:           65 bpm. Exam Location:  Inpatient Procedure: 2D Echo, Color Doppler and Cardiac Doppler Indications:    I50.9* Heart failure (unspecified)  History:        Patient has prior history of Echocardiogram examinations, most                 recent 02/07/2015. CHF, COPD, Arrythmias:Atrial Fibrillation; Risk                 Factors:Dyslipidemia and Hypertension.  Sonographer:    Raquel Sarna Senior RDCS Referring Phys: [phone] [place]  Sonographer Comments: Technically difficult study due to patient body habitus. No parsternal window. IMPRESSIONS  1. Left ventricular ejection fraction, by estimation, is 65 to 70%. The left ventricle has hyperdynamic function. The left ventricle has no regional wall motion abnormalities. Left ventricular diastolic function could not be evaluated.  2. Right  ventricular systolic function is mildly reduced. The right ventricular size is normal. There is moderately elevated pulmonary artery systolic pressure. The estimated right ventricular systolic pressure is 83.8 mmHg.  3. Left atrial size was severely dilated.  4. Right atrial size was severely dilated.  5. The mitral valve is normal in structure. No evidence of mitral valve regurgitation.  6. The aortic valve is grossly normal. Aortic valve regurgitation is not visualized. Mild aortic valve sclerosis is present, with no evidence of aortic valve stenosis.  7. The inferior vena cava is dilated in size with <50% respiratory variability, suggesting right atrial pressure of 15 mmHg. Comparison(s): Prior images reviewed side by side. There is now biatrial dilation. FINDINGS  Left Ventricle: Left ventricular ejection fraction, by estimation, is 65 to 70%. The left ventricle has hyperdynamic function. The left ventricle has no regional wall motion abnormalities. The left ventricular internal cavity size was normal in size. There is no left ventricular hypertrophy. Left ventricular diastolic function could not be evaluated due to atrial fibrillation. Left ventricular diastolic function could not be evaluated. Right Ventricle: The right ventricular size is normal. No increase in right ventricular wall thickness. Right ventricular systolic function is mildly reduced. There is moderately elevated pulmonary artery systolic pressure. The tricuspid regurgitant velocity is 3.31 m/s, and with an assumed right atrial pressure of 15 mmHg, the estimated right ventricular systolic pressure is 18.4 mmHg. Left Atrium: Left atrial size was severely dilated. Right Atrium: Right atrial size was severely dilated. Pericardium: There is no evidence of pericardial effusion. Mitral Valve: The mitral valve is normal in structure. No evidence of mitral valve regurgitation. Tricuspid Valve: The tricuspid valve is normal in structure. Tricuspid valve  regurgitation is mild. Aortic Valve: The aortic valve  is grossly normal. Aortic valve regurgitation is not visualized. Mild aortic valve sclerosis is present, with no evidence of aortic valve stenosis. Pulmonic Valve: The pulmonic valve was not well visualized. Pulmonic valve regurgitation is not visualized. Aorta: The aortic root was not well visualized. Venous: The inferior vena cava is dilated in size with less than 50% respiratory variability, suggesting right atrial pressure of 15 mmHg. IAS/Shunts: No atrial level shunt detected by color flow Doppler.  LEFT VENTRICLE PLAX 2D LVOT diam:     1.90 cm  Diastology LV SV:         53       LV e' lateral:   9.79 cm/s LV SV Index:   30       LV E/e' lateral: 10.9 LVOT Area:     2.84 cm LV e' medial:    7.62 cm/s                         LV E/e' medial:  14.0  RIGHT VENTRICLE RV S prime:     10.90 cm/s TAPSE (M-mode): 1.7 cm LEFT ATRIUM              Index       RIGHT ATRIUM           Index LA Vol (A2C):   119.0 ml 66.12 ml/m RA Area:     27.90 cm LA Vol (A4C):   103.0 ml 57.23 ml/m RA Volume:   78.30 ml  43.50 ml/m LA Biplane Vol: 113.0 ml 62.78 ml/m  AORTIC VALVE LVOT Vmax:   108.60 cm/s LVOT Vmean:  72.867 cm/s LVOT VTI:    0.188 m  AORTA Ao Root diam: 3.20 cm Ao Asc diam:  2.90 cm MITRAL VALVE                TRICUSPID VALVE MV Area (PHT): 3.48 cm     TR Peak grad:   43.8 mmHg MV Decel Time: 218 msec     TR Vmax:        331.00 cm/s MV E velocity: 107.00 cm/s MV A velocity: 23.30 cm/s   SHUNTS MV E/A ratio:  4.59         Systemic VTI:  0.19 m                             Systemic Diam: 1.90 cm Mihai Croitoru MD Electronically signed by Sanda Klein MD Signature Date/Time: 12/15/2019/11:31:15 AM    Final     Patient Profile     85 y.o. female with past medical history of chronic diastolic heart failure, permanent atrial fibrillation, prior intracranial hemorrhage, home O2 dependent COPD, breast cancer admitted with acute on chronic diastolic congestive heart  failure.  Echocardiogram shows normal LV function.  Assessment & Plan    1 acute on chronic diastolic congestive heart failure-I/O-3325; Wt 67 kg.  Patient slowly improving.  Decrease Lasix to 40 mg IV twice daily.  Follow renal function.  Potential discharge tomorrow if stable.  2 permanent atrial fibrillation-we will continue digoxin for rate control.  Change Cardizem to 120 mg CD daily.  Heart rate is controlled.  CHADSvasc 7.  However she is not an anticoagulation candidate given history of spontaneous intracranial hemorrhage.  She also declined watchman device.    3 COPD-significant.  Continue preadmission bronchodilators.  She is also on home oxygen.  For questions or updates, please contact  [facility] Please consult [email or web address] for contact info under        Signed, Kirk Ruths, MD  12/16/2019, 8:54 AM

## 2019-12-16 NOTE — Progress Notes (Signed)
  ReDS Clip Diuretic Study Pt study # [id number]  Your patient has been enrolled in the ReDS Clip Diuretic Study   Changes to prescribed diuretics recommended:  Consider change to furosemide 22m po BID (PTA daily)  Net-4L , LE edema  improved weight down 3Kg  Provider contacted: Dr. CStanford BreedRecommendation was accepted by provider.   REDS Clip  READING= 34%  CHEST RULER = 31 Clip Station = C   Orthodema score = 3 Signs/Symptoms Score   Mild edema, no orthopnea 0 No congestion  Moderate edema, no orthopnea 1 Low-grade orthodema/congestion  Severe edema OR orthopnea 2   Moderate edema and orthopnea 3 High-grade orthodema/congestion  Severe edema AND orthopnea 4    LBonnita NasutiPharm.D. CPP, BCPS Clinical Pharmacist [phone]/14/2021 8:29 AM    Please check [email or web address] for unit-specific pharmacist phone numbers

## 2019-12-17 ENCOUNTER — Other Ambulatory Visit: Payer: Self-pay | Admitting: Physician Assistant

## 2019-12-17 DIAGNOSIS — I5033 Acute on chronic diastolic (congestive) heart failure: Secondary | ICD-10-CM

## 2019-12-17 LAB — BASIC METABOLIC PANEL
Anion gap: 8 (ref 5–15)
BUN: 14 mg/dL (ref 8–23)
CO2: 34 mmol/L — ABNORMAL HIGH (ref 22–32)
Calcium: 8.9 mg/dL (ref 8.9–10.3)
Chloride: 101 mmol/L (ref 98–111)
Creatinine, Ser: 0.83 mg/dL (ref 0.44–1.00)
GFR calc Af Amer: >60 mL/min (ref >60)
GFR calc non Af Amer: >60 mL/min (ref >60)
Glucose, Bld: 86 mg/dL (ref 70–99)
Potassium: 4.2 mmol/L (ref 3.5–5.1)
Sodium: 143 mmol/L (ref 135–145)

## 2019-12-17 MED ORDER — POTASSIUM CHLORIDE ER 20 MEQ PO TBCR: 20.0000 meq | EXTENDED_RELEASE_TABLET | Freq: Two times a day (BID) | ORAL | 11 refills | Status: DC

## 2019-12-17 MED ORDER — FUROSEMIDE 40 MG PO TABS: 40.0000 mg | ORAL_TABLET | Freq: Two times a day (BID) | ORAL | 3 refills | Status: DC

## 2019-12-17 MED ORDER — FUROSEMIDE 40 MG PO TABS
40.0000 mg | ORAL_TABLET | Freq: Two times a day (BID) | ORAL | Status: DC
  Administered 2019-12-17: 40 mg via ORAL
  Filled 2019-12-17: qty 1

## 2019-12-17 MED ORDER — DILTIAZEM HCL ER COATED BEADS 120 MG PO CP24: 120.0000 mg | ORAL_CAPSULE | Freq: Every day | ORAL | 11 refills | Status: DC

## 2019-12-17 NOTE — Care Management Important Message (Signed)
Important Message  Patient Details  Name: [name] MRN: [id number] Date of Birth: 1935-06-03   Medicare Important Message Given:  Yes     [name] 12/17/2019, 9:36 AM

## 2019-12-17 NOTE — Plan of Care (Signed)

## 2019-12-17 NOTE — Discharge Summary (Signed)
None  Discharge Summary    Patient ID: [name] MRN: [id number]; DOB: April 09, 1935  Admit date: 12/14/2019 Discharge date: 12/17/2019  Primary Care Provider: Janith Lima, MD  Primary Cardiologist: Kirk Ruths, MD  Primary Electrophysiologist:  None   Discharge Diagnoses    Principal Problem:   CHF (congestive heart failure), NYHA class III, acute on chronic, diastolic (HCC) Active Problems:   COPD (chronic obstructive pulmonary disease) ([place])   Permanent atrial fibrillation (HCC)   Allergies Allergies  Allergen Reactions  . Heparin     Previous history of hemorrhagic stroke.  Marland Kitchen Morphine And Related Nausea And Vomiting    Diagnostic Studies/Procedures    2D echocardiogram, results below _____________   History of Present Illness     [name] is a 85 y.o. female with history of chronic diastolic heart failure, permanent atrial fibrillation, prior intracranial hemorrhage, home O2 dependent COPD, breast cancer, who was admitted 4/12 with acute on chronic diastolic congestive heart failure.    Hospital Course     Consultants: None  She had been seen in the office and diuretics increased, but her symptoms worsened.  At first, her O2 saturation was in the 80s, even on 5 L of oxygen.  She was diuresed with Lasix 40 mg IV twice daily.  She was enrolled in the ReDS Clip diuretic study.  This was used to help manage her diuretics and determine the transition to p.o. meds.  An echocardiogram was performed, results below.  Her EF is normal.  She had been on Cardizem CD 240 mg daily at home.  Her blood pressure was soft with diuresis and her Cardizem was decreased to 120 mg a day.  Her dig level was 1 so she was continued on digoxin 0.125 mg daily.  She is not an anticoagulation candidate due to her prior ICH while on aspirin.  She had some nonsustained VT on telemetry and her potassium goal is greater than 4.  It was supplemented.   Her renal function was  followed closely with diuresis.  Her COPD was managed with home inhalers and nebulizers.  She had no active wheezing.  She expressed a wish to be a DNR/DNI.  This was put in her chart.  On 12/17/2019, she was seen by Dr. Stanford Breed and all data were reviewed.  Her current weight is 69 kg and she is encouraged to weigh daily and contact us for weight gain or increasing shortness of breath.  Her previous Lasix dose had been 40 mg daily, she will be discharged on 40 mg twice daily.  She needs to be met next Tuesday, and TOC follow-up has been arranged.  Her home potassium supplement was increased.  She will be on 20 mEq twice daily instead of 10 mEq twice daily.  She is to get an early BMET and her potassium will need to be followed closely.   Her heart rate was stable on the dig and decreased dose of Cardizem.  She had previously declined watchman device.  Her respiratory status is felt to be at baseline.  No further inpatient work-up is indicated and she is considered stable for discharge, with outpatient follow-up arranged   Did the patient have an acute coronary syndrome (MI, NSTEMI, STEMI, etc) this admission?:  No                               Did the patient have a percutaneous coronary  intervention (stent / angioplasty)?:  No.   _____________  Discharge Vitals Blood pressure 119/68, pulse (!) 102, temperature 98.2 F (36.8 C), temperature source Oral, resp. rate 19, height _0  (1.676 m), weight 69 kg, last menstrual period 12/03/2002, SpO2 100 %.  Filed Weights   12/15/19 0604 12/16/19 0500 12/17/19 0435  Weight: 70.8 kg 67 kg 69 kg    Labs & Radiologic Studies    CBC Recent Labs    12/14/19 1940  WBC 9.0  NEUTROABS 6.1  HGB 13.2  HCT 43.0  MCV 90.5  PLT 350   Basic Metabolic Panel Recent Labs    12/15/19 0353 12/15/19 0755 12/16/19 0410 12/17/19 0439  NA   < >  --  144 143  K   < >  --  4.3 4.2  CL   < >  --  99 101  CO2   < >  --  35* 34*  GLUCOSE   < >  --   93 86  BUN   < >  --  14 14  CREATININE   < >  --  0.91 0.83  CALCIUM   < >  --  8.8* 8.9  MG  --  2.0  --   --    < > = values in this interval not displayed.   Liver Function Tests Recent Labs    12/14/19 1940  AST 24  ALT 16  ALKPHOS 106  BILITOT 0.9  PROT 6.6  ALBUMIN 3.6   Thyroid Function Tests Recent Labs    12/14/19 1940  TSH 1.490   Magnesium  Date Value Ref Range Status  12/15/2019 2.0 1.7 - 2.4 mg/dL Final    Comment:    Performed at [facility], [place] [address] [place], [place] [address]   Lab Results  Component Value Date   DIGOXIN 1.0 12/15/2019    _____________  DG Chest 2 View  Result Date: 12/11/2019 CLINICAL DATA:  Acute heart failure and shortness of breath. EXAM: CHEST - 2 VIEW COMPARISON:  10/27/2019 FINDINGS: Cardiomegaly and aortic atherosclerosis. Pulmonary venous hypertension with interstitial pulmonary edema. Small effusions layering dependently with dependent atelectasis. Findings slightly worsened compared to the study of February. IMPRESSION: Congestive heart failure with interstitial edema and effusions, slightly worsened. Electronically Signed   By: Nelson Chimes M.D.   On: 12/11/2019 13:53   ECHOCARDIOGRAM COMPLETE  Result Date: 12/15/2019    ECHOCARDIOGRAM REPORT   Patient Name:   [name] Date of Exam: 12/15/2019 Medical Rec #:  [id number]         Height:       66.0 in Accession #:    [id number]        Weight:       156.1 lb Date of Birth:  Dec 31, 1934        BSA:          1.800 m Patient Age:    85 years          BP:           97/53 mmHg Patient Gender: F                 HR:           65 bpm. Exam Location:  Inpatient Procedure: 2D Echo, Color Doppler and Cardiac Doppler Indications:    I50.9* Heart failure (unspecified)  History:        Patient has prior history of Echocardiogram examinations, most  recent 02/07/2015. CHF, COPD, Arrythmias:Atrial Fibrillation; Risk                 Factors:Dyslipidemia and  Hypertension.  Sonographer:    Raquel Sarna Senior RDCS Referring Phys: [phone] [place]  Sonographer Comments: Technically difficult study due to patient body habitus. No parsternal window. IMPRESSIONS  1. Left ventricular ejection fraction, by estimation, is 65 to 70%. The left ventricle has hyperdynamic function. The left ventricle has no regional wall motion abnormalities. Left ventricular diastolic function could not be evaluated.  2. Right ventricular systolic function is mildly reduced. The right ventricular size is normal. There is moderately elevated pulmonary artery systolic pressure. The estimated right ventricular systolic pressure is 49.6 mmHg.  3. Left atrial size was severely dilated.  4. Right atrial size was severely dilated.  5. The mitral valve is normal in structure. No evidence of mitral valve regurgitation.  6. The aortic valve is grossly normal. Aortic valve regurgitation is not visualized. Mild aortic valve sclerosis is present, with no evidence of aortic valve stenosis.  7. The inferior vena cava is dilated in size with <50% respiratory variability, suggesting right atrial pressure of 15 mmHg. Comparison(s): Prior images reviewed side by side. There is now biatrial dilation. FINDINGS  Left Ventricle: Left ventricular ejection fraction, by estimation, is 65 to 70%. The left ventricle has hyperdynamic function. The left ventricle has no regional wall motion abnormalities. The left ventricular internal cavity size was normal in size. There is no left ventricular hypertrophy. Left ventricular diastolic function could not be evaluated due to atrial fibrillation. Left ventricular diastolic function could not be evaluated. Right Ventricle: The right ventricular size is normal. No increase in right ventricular wall thickness. Right ventricular systolic function is mildly reduced. There is moderately elevated pulmonary artery systolic pressure. The tricuspid regurgitant velocity is 3.31 m/s, and with an  assumed right atrial pressure of 15 mmHg, the estimated right ventricular systolic pressure is 75.9 mmHg. Left Atrium: Left atrial size was severely dilated. Right Atrium: Right atrial size was severely dilated. Pericardium: There is no evidence of pericardial effusion. Mitral Valve: The mitral valve is normal in structure. No evidence of mitral valve regurgitation. Tricuspid Valve: The tricuspid valve is normal in structure. Tricuspid valve regurgitation is mild. Aortic Valve: The aortic valve is grossly normal. Aortic valve regurgitation is not visualized. Mild aortic valve sclerosis is present, with no evidence of aortic valve stenosis. Pulmonic Valve: The pulmonic valve was not well visualized. Pulmonic valve regurgitation is not visualized. Aorta: The aortic root was not well visualized. Venous: The inferior vena cava is dilated in size with less than 50% respiratory variability, suggesting right atrial pressure of 15 mmHg. IAS/Shunts: No atrial level shunt detected by color flow Doppler.  LEFT VENTRICLE PLAX 2D LVOT diam:     1.90 cm  Diastology LV SV:         53       LV e' lateral:   9.79 cm/s LV SV Index:   30       LV E/e' lateral: 10.9 LVOT Area:     2.84 cm LV e' medial:    7.62 cm/s                         LV E/e' medial:  14.0  RIGHT VENTRICLE RV S prime:     10.90 cm/s TAPSE (M-mode): 1.7 cm LEFT ATRIUM  Index       RIGHT ATRIUM           Index LA Vol (A2C):   119.0 ml 66.12 ml/m RA Area:     27.90 cm LA Vol (A4C):   103.0 ml 57.23 ml/m RA Volume:   78.30 ml  43.50 ml/m LA Biplane Vol: 113.0 ml 62.78 ml/m  AORTIC VALVE LVOT Vmax:   108.60 cm/s LVOT Vmean:  72.867 cm/s LVOT VTI:    0.188 m  AORTA Ao Root diam: 3.20 cm Ao Asc diam:  2.90 cm MITRAL VALVE                TRICUSPID VALVE MV Area (PHT): 3.48 cm     TR Peak grad:   43.8 mmHg MV Decel Time: 218 msec     TR Vmax:        331.00 cm/s MV E velocity: 107.00 cm/s MV A velocity: 23.30 cm/s   SHUNTS MV E/A ratio:  4.59          Systemic VTI:  0.19 m                             Systemic Diam: 1.90 cm Dani Gobble Croitoru MD Electronically signed by Sanda Klein MD Signature Date/Time: 12/15/2019/11:31:15 AM    Final    Disposition   Pt is being discharged home today in good condition.  Follow-up Plans & Appointments    Follow-up Information    [facility] Follow up on 12/22/2019.   Specialty: Cardiology Why: BMET, lab work to follow your kidney function and potassium levels.  Very important that you get it done that day.  Do not have to be fasting, okay to go ahead and take all morning medications. Contact information: [address] [place] [phone]         Discharge Instructions    ([place]) Call MD:  Anytime you have any of the following symptoms: 1) 3 pound weight gain in 24 hours or 5 pounds in 1 week 2) shortness of breath, with or without a dry hacking cough 3) swelling in the hands, feet or stomach 4) if you have to sleep on extra pillows at night in order to breathe.   Complete by: As directed    Diet - low sodium heart healthy   Complete by: As directed    Increase activity slowly   Complete by: As directed       Discharge Medications   Allergies as of 12/17/2019      Reactions   Heparin    Previous history of hemorrhagic stroke.   Morphine And Related Nausea And Vomiting      Medication List    TAKE these medications   albuterol 108 (90 Base) MCG/ACT inhaler Commonly known as: VENTOLIN HFA Inhale 2 puffs into the lungs every 6 (six) hours as needed for wheezing or shortness of breath.   arformoterol 15 MCG/2ML Nebu Commonly known as: BROVANA Take 15 mcg by nebulization 2 (two) times daily.   atorvastatin 10 MG tablet Commonly known as: LIPITOR Take 10 mg by mouth every evening.   budesonide 0.5 MG/2ML nebulizer solution Commonly known as: PULMICORT 2ML BY NEBULIZER TWICE DAILY What changed: See the  new instructions.   digoxin 0.125 MG tablet Commonly known as: LANOXIN Take 0.125 mg by mouth daily.   diltiazem 120 MG 24 hr capsule Commonly known  as: CARDIZEM CD Take 1 capsule (120 mg total) by mouth daily. What changed:   medication strength  See the new instructions.   fluorometholone 0.1 % ophthalmic suspension Commonly known as: FML Place 1 drop into both eyes daily.   furosemide 40 MG tablet Commonly known as: LASIX Take 1 tablet (40 mg total) by mouth 2 (two) times daily. What changed: when to take this   guaiFENesin 600 MG 12 hr tablet Commonly known as: MUCINEX Take 600 mg by mouth daily.   ipratropium 0.02 % nebulizer solution Commonly known as: ATROVENT Take 2.5 mLs (0.5 mg total) by nebulization 3 (three) times daily. DX: J44.9   latanoprost 0.005 % ophthalmic solution Commonly known as: XALATAN Place 1 drop into both eyes at bedtime.   OXYGEN Inhale 2 L into the lungs continuous.   pantoprazole 40 MG tablet Commonly known as: PROTONIX Take 40 mg by mouth daily.   Potassium Chloride ER 20 MEQ Tbcr Take 20 mEq by mouth 2 (two) times daily. What changed:   medication strength  how much to take   Shingrix injection Generic drug: Zoster Vaccine Adjuvanted Inject 0.5 mLs into the muscle once.   Simbrinza 1-0.2 % Susp Generic drug: Brinzolamide-Brimonidine Place 1 drop into both eyes in the morning and at bedtime.   valACYclovir 1000 MG tablet Commonly known as: VALTREX Take 1,000 mg by mouth daily.          Outstanding Labs/Studies   None  Duration of Discharge Encounter   Greater than 30 minutes including physician time.  Signed, Rosaria Ferries, PA-C 12/17/2019, 9:54 AM

## 2019-12-17 NOTE — Progress Notes (Signed)
  ReDS Clip Diuretic Study Pt study # [id number]  Your patient has been enrolled in the ReDS Clip Diuretic Study   Changes to prescribed diuretics recommended:  Consider change to furosemide 94m po BID (PTA daily)  Net-6L , LE edema  improved weight down 3Kg  Provider contacted: Dr. CStanford BreedRecommendation was accepted by provider.   REDS Clip  READING= 31%  CHEST RULER = 31 Clip Station = C   Orthodema score = 3 Signs/Symptoms Score   Mild edema, no orthopnea 0 No congestion  Moderate edema, no orthopnea 1 Low-grade orthodema/congestion  Severe edema OR orthopnea 2   Moderate edema and orthopnea 3 High-grade orthodema/congestion  Severe edema AND orthopnea 4    LBonnita NasutiPharm.D. CPP, BCPS Clinical Pharmacist [phone]/15/2021 7:31 AM    Please check [email or web address] for unit-specific pharmacist phone numbers

## 2019-12-17 NOTE — Discharge Instructions (Signed)
WEIGH DAILY, every am, wearing the same amount of clothing Record weights, contact Kirk Ruths, MD for weight gain of 3 lbs in a day or 5 lbs in a week Limit sodium to 500 mg per meal, total 2000 mg per day Limit all liquids to 1.5-2 liters/quarts per day

## 2019-12-17 NOTE — Progress Notes (Addendum)
Progress Note  Patient Name: [name] Date of Encounter: 12/17/2019  Primary Cardiologist: Kirk Ruths, MD   Subjective   No CP or palpitations  Inpatient Medications    Scheduled Meds: . arformoterol  15 mcg Nebulization BID  . atorvastatin  10 mg Oral QPM  . brinzolamide  1 drop Both Eyes BID   And  . brimonidine  1 drop Both Eyes BID  . budesonide (PULMICORT) nebulizer solution  0.25 mg Nebulization BID  . digoxin  0.125 mg Oral Daily  . diltiazem  120 mg Oral Daily  . fluorometholone   Both Eyes Daily  . furosemide  40 mg Intravenous BID  . guaiFENesin  600 mg Oral Daily  . ipratropium  0.5 mg Nebulization BID  . latanoprost  1 drop Both Eyes QHS  . pantoprazole  40 mg Oral Q0600  . potassium chloride  20 mEq Oral BID  . sodium chloride flush  3 mL Intravenous Q12H  . valACYclovir  1,000 mg Oral Daily   Continuous Infusions: . sodium chloride     PRN Meds: sodium chloride, acetaminophen, albuterol, ondansetron (ZOFRAN) IV, sodium chloride flush   Vital Signs    Vitals:   12/16/19 2151 12/17/19 0100 12/17/19 0435 12/17/19 0743  BP:   119/68   Pulse: 68  68 (!) 101  Resp: _0 Temp:   98.2 F (36.8 C)   TempSrc:   Oral   SpO2: 92% 92% 93% 100%  Weight:   69 kg   Height:        Intake/Output Summary (Last 24 hours) at 12/17/2019 0748 Last data filed at 12/17/2019 0500 Gross per 24 hour  Intake 3 ml  Output 1700 ml  Net -1697 ml   Last 3 Weights 12/17/2019 12/16/2019 12/15/2019  Weight (lbs) 152 lb 1.9 oz 147 lb 9.6 oz 156 lb 1.4 oz  Weight (kg) 69 kg 66.951 kg 70.8 kg      Telemetry    Afib, rate ok, no sig ectopy Personally Reviewed   Physical Exam   GEN: No acute distress.  WD, chronically ill appearing Neck: No JVD Cardiac: Irreg R&R, no murmurs, rubs, or gallops.  Respiratory: diminished to auscultation bilaterally   GI: Soft, NT/ND MS: No edema; No deformity. Neuro:  Grossly intact Psych: Normal affect    Labs     Chemistry Recent Labs  Lab 12/14/19 1940 12/14/19 1940 12/15/19 0353 12/16/19 0410 12/17/19 0439  NA 142   < > 142 144 143  K 4.6   < > 3.9 4.3 4.2  CL 101   < > 100 99 101  CO2 33*   < > 33* 35* 34*  GLUCOSE 106*   < > 94 93 86  BUN 18   < > _1 CREATININE 0.84   < > 0.78 0.91 0.83  CALCIUM 8.9   < > 8.6* 8.8* 8.9  PROT 6.6  --   --   --   --   ALBUMIN 3.6  --   --   --   --   AST 24  --   --   --   --   ALT 16  --   --   --   --   ALKPHOS 106  --   --   --   --   BILITOT 0.9  --   --   --   --   GFRNONAA >60   < > >  60 59* >60  GFRAA >60   < > >60 >60 >60  ANIONGAP 8   < > _0 < > = values in this interval not displayed.     Hematology Recent Labs  Lab 12/10/19 1620 12/14/19 1940  WBC 9.3 9.0  RBC 4.91 4.75  HGB 13.5 13.2  HCT 44.1 43.0  MCV 90 90.5  MCH 27.5 27.8  MCHC 30.6* 30.7  RDW 14.7 15.9*  PLT 278 296    BNP Recent Labs  Lab 12/10/19 1620 12/14/19 1940  BNP  --  738.5*  PROBNP 3,745*  --     Radiology    ECHOCARDIOGRAM COMPLETE  Result Date: 12/15/2019    ECHOCARDIOGRAM REPORT   Patient Name:   [name] Date of Exam: 12/15/2019 Medical Rec #:  [id number]         Height:       66.0 in Accession #:    [id number]        Weight:       156.1 lb Date of Birth:  09/27/1934        BSA:          1.800 m Patient Age:    85 years          BP:           97/53 mmHg Patient Gender: F                 HR:           65 bpm. Exam Location:  Inpatient Procedure: 2D Echo, Color Doppler and Cardiac Doppler Indications:    I50.9* Heart failure (unspecified)  History:        Patient has prior history of Echocardiogram examinations, most                 recent 02/07/2015. CHF, COPD, Arrythmias:Atrial Fibrillation; Risk                 Factors:Dyslipidemia and Hypertension.  Sonographer:    Raquel Sarna Senior RDCS Referring Phys: [id number]  Sonographer Comments: Technically difficult study due to patient body habitus. No parsternal window. IMPRESSIONS   1. Left ventricular ejection fraction, by estimation, is 65 to 70%. The left ventricle has hyperdynamic function. The left ventricle has no regional wall motion abnormalities. Left ventricular diastolic function could not be evaluated.  2. Right ventricular systolic function is mildly reduced. The right ventricular size is normal. There is moderately elevated pulmonary artery systolic pressure. The estimated right ventricular systolic pressure is 54.2 mmHg.  3. Left atrial size was severely dilated.  4. Right atrial size was severely dilated.  5. The mitral valve is normal in structure. No evidence of mitral valve regurgitation.  6. The aortic valve is grossly normal. Aortic valve regurgitation is not visualized. Mild aortic valve sclerosis is present, with no evidence of aortic valve stenosis.  7. The inferior vena cava is dilated in size with <50% respiratory variability, suggesting right atrial pressure of 15 mmHg. Comparison(s): Prior images reviewed side by side. There is now biatrial dilation. FINDINGS  Left Ventricle: Left ventricular ejection fraction, by estimation, is 65 to 70%. The left ventricle has hyperdynamic function. The left ventricle has no regional wall motion abnormalities. The left ventricular internal cavity size was normal in size. There is no left ventricular hypertrophy. Left ventricular diastolic function could not be evaluated due to atrial fibrillation. Left ventricular diastolic function could not be evaluated. Right  Ventricle: The right ventricular size is normal. No increase in right ventricular wall thickness. Right ventricular systolic function is mildly reduced. There is moderately elevated pulmonary artery systolic pressure. The tricuspid regurgitant velocity is 3.31 m/s, and with an assumed right atrial pressure of 15 mmHg, the estimated right ventricular systolic pressure is 65.4 mmHg. Left Atrium: Left atrial size was severely dilated. Right Atrium: Right atrial size was  severely dilated. Pericardium: There is no evidence of pericardial effusion. Mitral Valve: The mitral valve is normal in structure. No evidence of mitral valve regurgitation. Tricuspid Valve: The tricuspid valve is normal in structure. Tricuspid valve regurgitation is mild. Aortic Valve: The aortic valve is grossly normal. Aortic valve regurgitation is not visualized. Mild aortic valve sclerosis is present, with no evidence of aortic valve stenosis. Pulmonic Valve: The pulmonic valve was not well visualized. Pulmonic valve regurgitation is not visualized. Aorta: The aortic root was not well visualized. Venous: The inferior vena cava is dilated in size with less than 50% respiratory variability, suggesting right atrial pressure of 15 mmHg. IAS/Shunts: No atrial level shunt detected by color flow Doppler.  LEFT VENTRICLE PLAX 2D LVOT diam:     1.90 cm  Diastology LV SV:         53       LV e' lateral:   9.79 cm/s LV SV Index:   30       LV E/e' lateral: 10.9 LVOT Area:     2.84 cm LV e' medial:    7.62 cm/s                         LV E/e' medial:  14.0  RIGHT VENTRICLE RV S prime:     10.90 cm/s TAPSE (M-mode): 1.7 cm LEFT ATRIUM              Index       RIGHT ATRIUM           Index LA Vol (A2C):   119.0 ml 66.12 ml/m RA Area:     27.90 cm LA Vol (A4C):   103.0 ml 57.23 ml/m RA Volume:   78.30 ml  43.50 ml/m LA Biplane Vol: 113.0 ml 62.78 ml/m  AORTIC VALVE LVOT Vmax:   108.60 cm/s LVOT Vmean:  72.867 cm/s LVOT VTI:    0.188 m  AORTA Ao Root diam: 3.20 cm Ao Asc diam:  2.90 cm MITRAL VALVE                TRICUSPID VALVE MV Area (PHT): 3.48 cm     TR Peak grad:   43.8 mmHg MV Decel Time: 218 msec     TR Vmax:        331.00 cm/s MV E velocity: 107.00 cm/s MV A velocity: 23.30 cm/s   SHUNTS MV E/A ratio:  4.59         Systemic VTI:  0.19 m                             Systemic Diam: 1.90 cm Mihai Croitoru MD Electronically signed by Sanda Klein MD Signature Date/Time: 12/15/2019/11:31:15 AM    Final      Patient Profile     85 y.o. female with past medical history of chronic diastolic heart failure, permanent atrial fibrillation, prior intracranial hemorrhage, home O2 dependent COPD, breast cancer admitted with acute on chronic diastolic congestive heart failure.  Echocardiogram shows  normal LV function.  Assessment & Plan    1 acute on chronic diastolic congestive heart failure- - pt in ReDS Clip diuretic study, see pharmacist note, consider change to Lasix 40 mg po bid, pta was on Lasix 40 mg qd - I/O net 6 L but po intake zero yesterday - wt 69 kg, down 2 kg from admission - pt feels resp at baseline  2 permanent atrial fibrillation- - on dig and Cardizem (decreased dose from home med) - no anticoag 2nd hx spont ICH - declined Watchman   3 COPD- - she has nebs and inhalers at home - cont home O2  Plan: d/c today  For questions or updates, please contact [place] Please consult [email or web address] for contact info under   Signed, Rosaria Ferries, PA-C  12/17/2019, 7:48 AM   As above, patient seen and examined.  She is much improved today.  Her dyspnea is at baseline and she denies chest pain.  Will change Lasix to 40 mg by mouth twice daily.  Continue present dose of digoxin and Cardizem for rate control.  As outlined previously she is not on anticoagulation given history of intracranial hemorrhage and declined watchman in the past.  Plan discharge today.  Check potassium and renal function on Tuesday of next week.  Follow-up APP 1 to 2 weeks.  Follow-up with me 8 to 12 weeks.  Greater than 30 minutes PA and physician time. [place]

## 2019-12-17 NOTE — Care Management (Signed)
12-17-19 1054 Case Manager spoke with patient regarding home health needs and heart failure orders. Patient was independent prior to admission and will return home. Patient states she will not need any home health services at this time. Patient's husband will bring oxygen to the hospital for travel home. Patient will transition home via private vehicle. No further needs from Case Manager at this time. [place]-Bigelow, Ocie Cornfield, RN, BSN Case Manager

## 2019-12-18 ENCOUNTER — Other Ambulatory Visit: Payer: Self-pay | Admitting: Cardiology

## 2019-12-18 ENCOUNTER — Telehealth: Payer: Self-pay | Admitting: *Deleted

## 2019-12-18 NOTE — Telephone Encounter (Signed)
Pt was on TCM report admitted 12/14/19 with acute on chronic diastolic congestive heart failure. She was enrolled in the ReDS Clip diuretic study. This was used to help manage her diuretics and determine the transition to p.o. meds. An echocardiogram was performed, Her EF is normal. Pt D/C 12/17/19, and will follow up [facility] (Cardiology) on 12/22/2019.Marland KitchenJohny Chess

## 2019-12-22 ENCOUNTER — Other Ambulatory Visit: Payer: Self-pay | Admitting: Physician Assistant

## 2019-12-22 ENCOUNTER — Telehealth (HOSPITAL_COMMUNITY): Payer: Self-pay

## 2019-12-22 ENCOUNTER — Other Ambulatory Visit: Payer: Self-pay

## 2019-12-22 ENCOUNTER — Other Ambulatory Visit: Payer: Medicare Other

## 2019-12-22 DIAGNOSIS — I5033 Acute on chronic diastolic (congestive) heart failure: Secondary | ICD-10-CM | POA: Diagnosis not present

## 2019-12-22 NOTE — Telephone Encounter (Signed)
  Called patient to follow up and see if she was ready to scheduled her ABI that Dr Elsworth Soho ordered. She informed me that she was in the hospital all last week and she was not interested in having the test at all. She also told me Dr Stanford Breed was now following her for Cardiology. I did cancel her order and made Nani Gasser aware at Dr Bari Mantis office.   [facility]

## 2019-12-23 LAB — BASIC METABOLIC PANEL
BUN/Creatinine Ratio: 23
BUN: 21 mg/dL
CO2: 27 mmol/L (ref 20–29)
Calcium: 9.2 mg/dL
Chloride: 100 mmol/L (ref 96–106)
Creatinine, Ser: 0.92 mg/dL
Glucose: 89 mg/dL (ref 65–99)
Potassium: 4.6 mmol/L (ref 3.5–5.2)
Sodium: 142 mmol/L (ref 134–144)

## 2019-12-24 ENCOUNTER — Other Ambulatory Visit (HOSPITAL_COMMUNITY): Payer: Medicare Other

## 2019-12-29 NOTE — Progress Notes (Signed)
Cardiology Office Note   Date:  12/29/2019   ID:  [name], DOB June 08, 1935, MRN [id number]  PCP:  Janith Lima, MD  Cardiologist: Dr. Stanford Breed CC: Hospital follow-up  History of Present Illness: [name] is a 85 y.o. female who presents for posthospitalization follow-up after admission from the office with decompensated CHF, [place] heart association class III symptoms, with other history of O2 dependent COPD and permanent atrial fibrillation.  During hospitalization she was given IV Lasix 40 mg IV daily.  She was enrolled in the ReDS clip diuretic study.  This was used to help manage her diuretics and determine the transition to p.o. meds.  Echocardiogram revealed normal LV systolic function.  Diltiazem was decreased to 120 mg daily as her blood pressure was soft (had been on 240 mg daily) her dig level was normal and she was continued on digoxin 0.125 mg daily.  She was not found to be an anticoagulation candidate due to prior history of intracranial hemorrhage while on aspirin.  On discharge her weight was 69 kg, she was discharged on Lasix 40 mg twice daily and increase from her daily 40 mg dose prior to admission.  Her respiratory status was felt to be at baseline and she was continued on home O2.  She comes today hypotensive.  She does not feel weak or dizzy but she does feel tired.  She remains on diltiazem 120 mg as directed, Lasix 40 mg twice daily.  She is concerned about her husband who is recently been diagnosed with leukemia.  Past Medical History:  Diagnosis Date  . Arthritis   . COPD (chronic obstructive pulmonary disease) ([place])   . Dyspnea on exertion   . GERD (gastroesophageal reflux disease)   . Glaucoma of both eyes   . History of atrial fibrillation without current medication 2010   POST SURG 2010--  PER PT NO ISSUES SINCE  . History of breast cancer    DX DCIS IN 2004--  S/P RIGHT MASTECTOMY , NO CHEMORADIATION---  NO RECURRENCE  . History of  CHF (congestive heart failure)    POST SURG 2010  . History of hypertension   . History of shingles    02/ 2015--  back of neck and left flank--  no residual pain  . ICH (intracerebral hemorrhage) ([place]) 02/05/2015  . Nephrolithiasis   . Recurrent bladder papillary carcinoma ([place]) first dx 06/ 2014   s/p  turbt's  and instillation mitomycin c (chemo)//dx again in 2015-different type  . Stroke [facility])     Past Surgical History:  Procedure Laterality Date  . CATARACT EXTRACTION W/ INTRAOCULAR LENS  IMPLANT, BILATERAL    . CRANIECTOMY N/A 02/05/2015   Procedure: SUBOCCIPITAL CRANIECTOMY EVACUATION OF HEMATOMA;  Surgeon: Consuella Lose, MD;  Location: [place] [facility];  Service: Neurosurgery;  Laterality: N/A;  . CYSTOSCOPY W/ RETROGRADES Bilateral 08/11/2014   Procedure: CYSTOSCOPY WITH BILATERAL RETROGRADE PYELOGRAM AND MITOMYCIN INSTILLATION;  Surgeon: Alexis Frock, MD;  Location: [facility];  Service: Urology;  Laterality: Bilateral;  . CYSTOSCOPY W/ URETERAL STENT PLACEMENT Bilateral 03/26/2013   Procedure: CYSTOSCOPY WITH BILATERAL RETROGRADE PYELOGRAM/URETERAL STENT PLACEMENT;  Surgeon: Alexis Frock, MD;  Location: [facility];  Service: Urology;  Laterality: Bilateral;  . CYSTOSCOPY W/ URETERAL STENT PLACEMENT Left 05/13/2013   Procedure: CYSTOSCOPY WITH RETROGRADE PYELOGRAM/URETERAL STENT PLACEMENT STENT EXCHANGE;  Surgeon: Alexis Frock, MD;  [facility];  Service: Urology;  Laterality: Left;  . PARTIAL MASTECTOMY WITH NEEDLE  LOCALIZATION Right 01-14-2003   DCIS  . REMOVAL CYST LEFT HAND  2013  . TOTAL KNEE ARTHROPLASTY Right 03-22-2009  . TOTAL MASTECTOMY Right 02-03-2003   W/ SLN BX  AND  POST 03-01-2003 EVACUATION HEMATOMA  . TOTAL MASTECTOMY Left 05/04/2019   Procedure: LEFT TOTAL MASTECTOMY;  Surgeon: Fanny Skates, MD;  Location: [place];  Service: General;  Laterality: Left;  . TRANSTHORACIC ECHOCARDIOGRAM  03-25-2009    MILD LVF/ EF 70-80%/ MILD INCREASE SYSTOLIC PULMONARY  PRESSURE  . TRANSURETHRAL RESECTION OF BLADDER TUMOR WITH GYRUS (TURBT-GYRUS) N/A 03/26/2013   Procedure: TRANSURETHRAL RESECTION OF BLADDER TUMOR WITH GYRUS (TURBT-GYRUS);  Surgeon: Alexis Frock, MD;  Location: [facility];  Service: Urology;  Laterality: N/A;  . TRANSURETHRAL RESECTION OF BLADDER TUMOR WITH GYRUS (TURBT-GYRUS) N/A 05/13/2013   Procedure: TRANSURETHRAL RESECTION OF BLADDER TUMOR WITH GYRUS (TURBT-GYRUS)  RE-STAGING TRANSURETHRAL RESECTION OF BLADDER TUMOR, LEFT RETROGRADE PYELOGRAM AND STENT EXCHANGE;  Surgeon: Alexis Frock, MD;  Location: [facility];  Service: Urology;  Laterality: N/A;  . TRANSURETHRAL RESECTION OF BLADDER TUMOR WITH GYRUS (TURBT-GYRUS) N/A 08/11/2014   Procedure: TRANSURETHRAL RESECTION OF BLADDER TUMOR WITH GYRUS (TURBT-GYRUS);  Surgeon: Alexis Frock, MD;  Location: [facility];  Service: Urology;  Laterality: N/A;  . TUBAL LIGATION       Current Outpatient Medications  Medication Sig Dispense Refill  . albuterol (VENTOLIN HFA) 108 (90 Base) MCG/ACT inhaler Inhale 2 puffs into the lungs every 6 (six) hours as needed for wheezing or shortness of breath. 8 g 2  . arformoterol (BROVANA) 15 MCG/2ML NEBU Take 15 mcg by nebulization 2 (two) times daily.    Marland Kitchen atorvastatin (LIPITOR) 10 MG tablet Take 10 mg by mouth every evening.    . Brinzolamide-Brimonidine (SIMBRINZA) 1-0.2 % SUSP Place 1 drop into both eyes in the morning and at bedtime.    . budesonide (PULMICORT) 0.5 MG/2ML nebulizer solution 2ML BY NEBULIZER TWICE DAILY (Patient taking differently: Take 0.5 mg by nebulization in the morning and at bedtime. ) 120 mL 5  . digoxin (LANOXIN) 0.125 MG tablet TAKE ONE TABLET DAILY 90 tablet 2  . diltiazem (CARDIZEM CD) 120 MG 24 hr capsule Take 1 capsule (120 mg total) by mouth daily. 30 capsule 11  . fluorometholone (FML) 0.1 % ophthalmic suspension Place 1 drop  into both eyes daily.    . furosemide (LASIX) 40 MG tablet Take 1 tablet (40 mg total) by mouth 2 (two) times daily. 180 tablet 3  . guaiFENesin (MUCINEX) 600 MG 12 hr tablet Take 600 mg by mouth daily.    Marland Kitchen ipratropium (ATROVENT) 0.02 % nebulizer solution Take 2.5 mLs (0.5 mg total) by nebulization 3 (three) times daily. DX: J44.9 225 mL 5  . latanoprost (XALATAN) 0.005 % ophthalmic solution Place 1 drop into both eyes at bedtime.    . OXYGEN Inhale 2 L into the lungs continuous.     . pantoprazole (PROTONIX) 40 MG tablet Take 40 mg by mouth daily.    . Potassium Chloride ER 20 MEQ TBCR Take 20 mEq by mouth 2 (two) times daily. 60 tablet 11  . SHINGRIX injection Inject 0.5 mLs into the muscle once.     . valACYclovir (VALTREX) 1000 MG tablet Take 1,000 mg by mouth daily.     No current facility-administered medications for this visit.    Allergies:   Heparin and Morphine and related    Social History:  The patient  reports that she quit smoking  about 16 years ago. Her smoking use included cigarettes. She has a 40.00 pack-year smoking history. She has never used smokeless tobacco. She reports that she does not drink alcohol or use drugs.   Family History:  The patient's family history includes Breast cancer (age of onset: 36) in her maternal aunt; Breast cancer (age of onset: 78) in her daughter; Breast cancer (age of onset: 61) in an other family member; Cancer in her maternal aunt; Colon cancer in her maternal grandmother; Colon cancer (age of onset: 16) in her mother; Prostate cancer in her paternal grandfather; Stroke in her maternal grandmother and paternal grandmother.    ROS: All other systems are reviewed and negative. Unless otherwise mentioned in H&P    PHYSICAL EXAM: VS:  LMP 12/03/2002  , BMI There is no height or weight on file to calculate BMI. GEN: Well nourished, well developed, in no acute distress HEENT: normal Neck: no JVD, carotid bruits, or masses Cardiac: IRRR; no  murmurs, rubs, or gallops,no edema  Respiratory:  Clear to auscultation bilaterally, some mild dyspnea at baseline wearing oxygen via nasal cannula GI: soft, nontender, nondistended, + BS MS: no deformity or atrophy.  Erythemic lower extremities, cool to touch. Skin: warm and dry, no rash Neuro:  Strength and sensation are intact Psych: euthymic mood, full affect   EKG: Not completed this office visit  Recent Labs: 12/10/2019: NT-Pro BNP 3,745 12/14/2019: ALT 16; B Natriuretic Peptide 738.5; Hemoglobin 13.2; Platelets 296; TSH 1.490 01-Jan-2020: Magnesium 2.0 12/22/2019: BUN 21; Creatinine, Ser 0.92; Potassium 4.6; Sodium 142    Lipid Panel    Component Value Date/Time   CHOL 121 04/19/2017 0936   TRIG 114 04/19/2017 0936   HDL 46 04/19/2017 0936   CHOLHDL 2.6 04/19/2017 0936   CHOLHDL 3 05/09/2016 1216   VLDL 36.8 05/09/2016 1216   LDLCALC 52 04/19/2017 0936      Wt Readings from Last 3 Encounters:  12/17/19 152 lb 1.9 oz (69 kg)  12/14/19 157 lb 6.4 oz (71.4 kg)  12/10/19 160 lb 3.2 oz (72.7 kg)      Other studies Reviewed: Echocardiogram 2020/01/01 1. Left ventricular ejection fraction, by estimation, is 65 to 70%. The  left ventricle has hyperdynamic function. The left ventricle has no  regional wall motion abnormalities. Left ventricular diastolic function  could not be evaluated.  2. Right ventricular systolic function is mildly reduced. The right  ventricular size is normal. There is moderately elevated pulmonary artery  systolic pressure. The estimated right ventricular systolic pressure is  78.4 mmHg.  3. Left atrial size was severely dilated.  4. Right atrial size was severely dilated.  5. The mitral valve is normal in structure. No evidence of mitral valve  regurgitation.  6. The aortic valve is grossly normal. Aortic valve regurgitation is not  visualized. Mild aortic valve sclerosis is present, with no evidence of  aortic valve stenosis.  7. The  inferior vena cava is dilated in size with <50% respiratory  variability, suggesting right atrial pressure of 15 mmHg.    ASSESSMENT AND PLAN:  1.  Chronic diastolic heart failure: She is doing much better after significant diuresis.  Her weight is now well controlled.  She denies any worsening breathing status in fact she is breathing much better.  However she is hypotensive.  I am going to decrease her Lasix to 40 mg in the morning and 20 mg in the evening.  However if she begins to gain weight or have worsening breathing  status we will have her take the additional 20 mg in the evening.  She is advised to wear self daily and avoid salted foods.  She is grateful for the care she received during hospitalization.  2.  Atrial fibrillation: Heart rate is currently well controlled.  She is now on lower dose of diltiazem 120 mg daily along with digoxin 0.125 mg daily.  Not on anticoagulation due to [place].  3.  O2 dependent COPD: Continues to wear O2 but is breathing some better more back to her baseline.  She is to follow-up with pulmonary as directed.  Current medicines are reviewed at length with the patient today.  I have spent 25 minutes dedicated to the care of this patient on the date of this encounter to include pre-visit review of records, assessment, management and diagnostic testing,with shared decision making.  Labs/ tests ordered today include: BMET just prior to next office visit in May  Hajer Dwyer M. [place] Pugh, ANP, AACC   12/29/2019 5:31 PM    [facility] [address] Office [phone] Fax [phone]  Notice: This dictation was prepared with Dragon dictation along with smaller phrase technology. Any transcriptional errors that result from this process are unintentional and may not be corrected upon review.

## 2019-12-30 ENCOUNTER — Ambulatory Visit (INDEPENDENT_AMBULATORY_CARE_PROVIDER_SITE_OTHER): Payer: Medicare Other | Admitting: Adult Health

## 2019-12-30 ENCOUNTER — Other Ambulatory Visit: Payer: Self-pay

## 2019-12-30 ENCOUNTER — Encounter: Payer: Self-pay | Admitting: Adult Health

## 2019-12-30 VITALS — BP 96/60 | HR 64 | Ht 66.0 in | Wt 140.2 lb

## 2019-12-30 DIAGNOSIS — I5032 Chronic diastolic (congestive) heart failure: Secondary | ICD-10-CM | POA: Diagnosis not present

## 2019-12-30 DIAGNOSIS — R0902 Hypoxemia: Secondary | ICD-10-CM | POA: Diagnosis not present

## 2019-12-30 DIAGNOSIS — Z79899 Other long term (current) drug therapy: Secondary | ICD-10-CM

## 2019-12-30 DIAGNOSIS — J449 Chronic obstructive pulmonary disease, unspecified: Secondary | ICD-10-CM

## 2019-12-30 DIAGNOSIS — I4821 Permanent atrial fibrillation: Secondary | ICD-10-CM | POA: Diagnosis not present

## 2019-12-30 NOTE — Patient Instructions (Signed)
Medication Instructions:  DECREASE- Furosemide 40 mg by mouth in the morning and 20 mg in the evening DECREASE- Potassium 20 meq by mouth in the morning and 10 meq in the evening  *If you need a refill on your cardiac medications before your next appointment, please call your pharmacy*   Lab Work: BMP in 2 weeks  If you have labs (blood work) drawn today and your tests are completely normal, you will receive your results only by: Marland Kitchen MyChart Message (if you have MyChart) OR . A paper copy in the mail If you have any lab test that is abnormal or we need to change your treatment, we will call you to review the results.   Testing/Procedures: None Ordered   Follow-Up: At [facility], you and your health needs are our priority.  As part of our continuing mission to provide you with exceptional heart care, we have created designated Provider Care Teams.  These Care Teams include your primary Cardiologist (physician) and Advanced Practice Providers (APPs -  Physician Assistants and Nurse Practitioners) who all work together to provide you with the care you need, when you need it.  We recommend signing up for the patient portal called "MyChart".  Sign up information is provided on this After Visit Summary.  MyChart is used to connect with patients for Virtual Visits (Telemedicine).  Patients are able to view lab/test results, encounter notes, upcoming appointments, etc.  Non-urgent messages can be sent to your provider as well.   To learn more about what you can do with MyChart, go to [email or web address].    Your next appointment:   Keep appointment with Dr Stanford Breed on May 18th @ 4:20 pm  The format for your next appointment:   In Person

## 2020-01-06 ENCOUNTER — Other Ambulatory Visit: Payer: Self-pay | Admitting: *Deleted

## 2020-01-06 ENCOUNTER — Telehealth: Payer: Self-pay | Admitting: Adult Health

## 2020-01-06 MED ORDER — ARFORMOTEROL TARTRATE 15 MCG/2ML IN NEBU: 15.0000 ug | INHALATION_SOLUTION | Freq: Two times a day (BID) | RESPIRATORY_TRACT | 5 refills | Status: DC

## 2020-01-06 NOTE — Telephone Encounter (Signed)
Spoke with the pt  She states she is needing rx for brovana sent to [name]  Rx sent  Nothing further needed

## 2020-01-07 ENCOUNTER — Other Ambulatory Visit: Payer: Self-pay | Admitting: Cardiology

## 2020-01-08 ENCOUNTER — Other Ambulatory Visit: Payer: Self-pay | Admitting: Physician Assistant

## 2020-01-08 ENCOUNTER — Other Ambulatory Visit: Payer: Self-pay | Admitting: Cardiology

## 2020-01-08 NOTE — Telephone Encounter (Signed)
*  STAT* If patient is at the pharmacy, call can be transferred to refill team.   1. Which medications need to be refilled? (please list name of each medication and dose if known)  atorvastatin (LIPITOR) 10 MG tablet  2. Which pharmacy/location (including street and city if local pharmacy) is medication to be sent to? [facility] [place], [place] - [address]  3. Do they need a 30 day or 90 day supply? 90  Pt is out of medication

## 2020-01-12 MED ORDER — ATORVASTATIN CALCIUM 10 MG PO TABS: ORAL_TABLET | ORAL | 3 refills | Status: DC

## 2020-01-14 NOTE — Progress Notes (Signed)
HPI: FU atrial fibrillation and CHF. Patient was admitted in June 2016 with a large right cerebellar hemorrhage. Note her only anticoagulate at that time was aspirin. The hemorrhage required surgical evacuation. Patient developed atrial fibrillation during the hospitalization with difficult to control heart rate. She was felt not to be a candidate for anticoagulation given intracranial hemorrhage. Holter 7/16 showed atrial fibrillation with elevated rate; digoxin added. Ipreviously contacted Dr Kathyrn Sheriff Neurosurgery to see if she would be a candidate for short-term anticoagulation which would then qualify her for a watchman device. He felt this could be pursued.  However she declined watchman.  Patient admitted April 2021 with CHF.  Echocardiogram April 2021 showed vigorous LV systolic function, mild RV dysfunction, severe biatrial enlargement, moderate pulmonary hypertension.  Patient seen by Bunnie Domino April 2021 and Lasix decreased.  Since last seen,she has some dyspnea on exertion but no orthopnea, PND, pedal edema, chest pain or syncope.  No bleeding.  Current Outpatient Medications  Medication Sig Dispense Refill  . albuterol (VENTOLIN HFA) 108 (90 Base) MCG/ACT inhaler Inhale 2 puffs into the lungs every 6 (six) hours as needed for wheezing or shortness of breath. 8 g 2  . arformoterol (BROVANA) 15 MCG/2ML NEBU Take 2 mLs (15 mcg total) by nebulization 2 (two) times daily. 120 mL 5  . atorvastatin (LIPITOR) 10 MG tablet TAKE ONE TABLET DAILY AT 6PM 90 tablet 3  . Brinzolamide-Brimonidine (SIMBRINZA) 1-0.2 % SUSP Place 1 drop into both eyes in the morning and at bedtime.    . budesonide (PULMICORT) 0.5 MG/2ML nebulizer solution 2ML BY NEBULIZER TWICE DAILY (Patient taking differently: Take 0.5 mg by nebulization in the morning and at bedtime. ) 120 mL 5  . digoxin (LANOXIN) 0.125 MG tablet TAKE ONE TABLET DAILY 90 tablet 2  . diltiazem (CARDIZEM CD) 120 MG 24 hr capsule Take 1  capsule (120 mg total) by mouth daily. 30 capsule 11  . fluorometholone (FML) 0.1 % ophthalmic suspension Place 1 drop into both eyes daily.    . furosemide (LASIX) 40 MG tablet Take 40 mg by mouth in the morning and 20 mg in the evening    . guaiFENesin (MUCINEX) 600 MG 12 hr tablet Take 600 mg by mouth daily.    Marland Kitchen ipratropium (ATROVENT) 0.02 % nebulizer solution Take 2.5 mLs (0.5 mg total) by nebulization 3 (three) times daily. DX: J44.9 225 mL 5  . latanoprost (XALATAN) 0.005 % ophthalmic solution Place 1 drop into both eyes at bedtime.    . OXYGEN Inhale 2 L into the lungs continuous.     . pantoprazole (PROTONIX) 40 MG tablet Take 40 mg by mouth daily.    . potassium chloride SA (KLOR-CON) 20 MEQ tablet Take 20 meq by mouth in the morning and 10 meq in the evening    . SHINGRIX injection Inject 0.5 mLs into the muscle once.     . valACYclovir (VALTREX) 1000 MG tablet Take 1,000 mg by mouth daily.     No current facility-administered medications for this visit.     Past Medical History:  Diagnosis Date  . Arthritis   . COPD (chronic obstructive pulmonary disease) ([place])   . Dyspnea on exertion   . GERD (gastroesophageal reflux disease)   . Glaucoma of both eyes   . History of atrial fibrillation without current medication 2010   POST SURG 2010--  PER PT NO ISSUES SINCE  . History of breast cancer    DX DCIS IN  2004--  S/P RIGHT MASTECTOMY , NO CHEMORADIATION---  NO RECURRENCE  . History of CHF (congestive heart failure)    POST SURG 2010  . History of hypertension   . History of shingles    02/ 2015--  back of neck and left flank--  no residual pain  . ICH (intracerebral hemorrhage) ([place]) 02/05/2015  . Nephrolithiasis   . Recurrent bladder papillary carcinoma ([place]) first dx 06/ 2014   s/p  turbt's  and instillation mitomycin c (chemo)//dx again in 2015-different type  . Stroke [facility])     Past Surgical History:  Procedure Laterality Date  . CATARACT EXTRACTION W/ INTRAOCULAR  LENS  IMPLANT, BILATERAL    . CRANIECTOMY N/A 02/05/2015   Procedure: SUBOCCIPITAL CRANIECTOMY EVACUATION OF HEMATOMA;  Surgeon: Consuella Lose, MD;  Location: [facility];  Service: Neurosurgery;  Laterality: N/A;  . CYSTOSCOPY W/ RETROGRADES Bilateral 08/11/2014   Procedure: CYSTOSCOPY WITH BILATERAL RETROGRADE PYELOGRAM AND MITOMYCIN INSTILLATION;  Surgeon: Alexis Frock, MD;  Location[facility];  Service: Urology;  Laterality: Bilateral;  . CYSTOSCOPY W/ URETERAL STENT PLACEMENT Bilateral 03/26/2013   Procedure: CYSTOSCOPY WITH BILATERAL RETROGRADE PYELOGRAM/URETERAL STENT PLACEMENT;  Surgeon: Alexis Frock, MD;  [facility];  Service: Urology;  Laterality: Bilateral;  . CYSTOSCOPY W/ URETERAL STENT PLACEMENT Left 05/13/2013   Procedure: CYSTOSCOPY WITH RETROGRADE PYELOGRAM/URETERAL STENT PLACEMENT STENT EXCHANGE;  Surgeon: Alexis Frock, MD;  Location: [facility];  Service: Urology;  Laterality: Left;  . PARTIAL MASTECTOMY WITH NEEDLE LOCALIZATION Right 01-14-2003   DCIS  . REMOVAL CYST LEFT HAND  2013  . TOTAL KNEE ARTHROPLASTY Right 03-22-2009  . TOTAL MASTECTOMY Right 02-03-2003   W/ SLN BX  AND  POST 03-01-2003 EVACUATION HEMATOMA  . TOTAL MASTECTOMY Left 05/04/2019   Procedure: LEFT TOTAL MASTECTOMY;  Surgeon: Fanny Skates, MD;  Location: [facility];  Service: General;  Laterality: Left;  . TRANSTHORACIC ECHOCARDIOGRAM  03-25-2009   MILD LVF/ EF 70-80%/ MILD INCREASE SYSTOLIC PULMONARY  PRESSURE  . TRANSURETHRAL RESECTION OF BLADDER TUMOR WITH GYRUS (TURBT-GYRUS) N/A 03/26/2013   Procedure: TRANSURETHRAL RESECTION OF BLADDER TUMOR WITH GYRUS (TURBT-GYRUS);  Surgeon: Alexis Frock, MD;  Location: [facility];  Service: Urology;  Laterality: N/A;  . TRANSURETHRAL RESECTION OF BLADDER TUMOR WITH GYRUS (TURBT-GYRUS) N/A 05/13/2013   Procedure: TRANSURETHRAL RESECTION OF BLADDER TUMOR WITH GYRUS (TURBT-GYRUS)   RE-STAGING TRANSURETHRAL RESECTION OF BLADDER TUMOR, LEFT RETROGRADE PYELOGRAM AND STENT EXCHANGE;  Surgeon: Alexis Frock, MD;  Location: [facility];  Service: Urology;  Laterality: N/A;  . TRANSURETHRAL RESECTION OF BLADDER TUMOR WITH GYRUS (TURBT-GYRUS) N/A 08/11/2014   Procedure: TRANSURETHRAL RESECTION OF BLADDER TUMOR WITH GYRUS (TURBT-GYRUS);  Surgeon: Alexis Frock, MD;  Location: [facility];  Service: Urology;  Laterality: N/A;  . TUBAL LIGATION      Social History   Socioeconomic History  . Marital status: Married    Spouse name: Not on file  . Number of children: 4  . Years of education: Not on file  . Highest education level: Not on file  Occupational History  . Not on file  Tobacco Use  . Smoking status: Former Smoker    Packs/day: 1.00    Years: 40.00    Pack years: 40.00    Types: Cigarettes    Quit date: 03/20/2003    Years since quitting: 16.8  . Smokeless tobacco: Never Used  Substance and Sexual Activity  . Alcohol use: No    Alcohol/week: 0.0  standard drinks  . Drug use: No  . Sexual activity: Not Currently    Partners: Male    Birth control/protection: Surgical    Comment: BTL  Other Topics Concern  . Not on file  Social History Narrative  . Not on file   Social Determinants of Health   Financial Resource Strain:   . Difficulty of Paying Living Expenses:   Food Insecurity:   . Worried About Charity fundraiser in the Last Year:   . Arboriculturist in the Last Year:   Transportation Needs:   . Film/video editor (Medical):   Marland Kitchen Lack of Transportation (Non-Medical):   Physical Activity:   . Days of Exercise per Week:   . Minutes of Exercise per Session:   Stress:   . Feeling of Stress :   Social Connections:   . Frequency of Communication with Friends and Family:   . Frequency of Social Gatherings with Friends and Family:   . Attends Religious Services:   . Active Member of Clubs or Organizations:   .  Attends Archivist Meetings:   Marland Kitchen Marital Status:   Intimate Partner Violence:   . Fear of Current or Ex-Partner:   . Emotionally Abused:   Marland Kitchen Physically Abused:   . Sexually Abused:     Family History  Problem Relation Age of Onset  . Colon cancer Mother 66  . Colon cancer Maternal Grandmother   . Stroke Maternal Grandmother   . Breast cancer Maternal Aunt 15  . Cancer Maternal Aunt        ? ovarian  . Breast cancer Other 60       breast cancer, ovarian cancer  . Breast cancer Daughter 71  . Stroke Paternal Grandmother   . Prostate cancer Paternal Grandfather   . Heart attack Neg Hx   . Hypertension Neg Hx     ROS: no fevers or chills, productive cough, hemoptysis, dysphasia, odynophagia, melena, hematochezia, dysuria, hematuria, rash, seizure activity, orthopnea, PND, pedal edema, claudication. Remaining systems are negative.  Physical Exam: Well-developed well-nourished in no acute distress.  Skin is warm and dry.  HEENT is normal.  Neck is supple.  Chest is clear to auscultation with normal expansion.  Cardiovascular exam is irregular Abdominal exam nontender or distended. No masses palpated. Extremities show no edema. neuro grossly intact  A/P  1 permanent atrial fibrillation-plan to continue Cardizem and digoxin for rate control.  As outlined above patient had a prior intracranial hemorrhage on aspirin and therefore is not a candidate for long-term anticoagulation.  We offered short-term anticoagulation with placement of watchman device but she declined.  She understands the higher risk of CVA.  2 chronic diastolic congestive heart failure-she is euvolemic today on examination.  Continue Lasix at present dose.  Check potassium and renal function.  3 hypertension-blood pressure controlled.  Continue present medical regimen.  4 hyperlipidemia-continue statin.  5 COPD-follow-up pulmonary.  Kirk Ruths, MD

## 2020-01-19 ENCOUNTER — Other Ambulatory Visit: Payer: Self-pay

## 2020-01-19 ENCOUNTER — Encounter: Payer: Self-pay | Admitting: Cardiology

## 2020-01-19 ENCOUNTER — Ambulatory Visit (INDEPENDENT_AMBULATORY_CARE_PROVIDER_SITE_OTHER): Payer: Medicare Other | Admitting: Cardiology

## 2020-01-19 VITALS — BP 92/54 | HR 79 | Ht 66.0 in | Wt 139.4 lb

## 2020-01-19 DIAGNOSIS — I4821 Permanent atrial fibrillation: Secondary | ICD-10-CM | POA: Diagnosis not present

## 2020-01-19 DIAGNOSIS — I5032 Chronic diastolic (congestive) heart failure: Secondary | ICD-10-CM | POA: Diagnosis not present

## 2020-01-19 DIAGNOSIS — I10 Essential (primary) hypertension: Secondary | ICD-10-CM | POA: Diagnosis not present

## 2020-01-19 NOTE — Patient Instructions (Signed)
Medication Instructions:  NO CHANGE *If you need a refill on your cardiac medications before your next appointment, please call your pharmacy*   Lab Work: Your physician recommends that you return for lab work WHEN CONVENIENT If you have labs (blood work) drawn today and your tests are completely normal, you will receive your results only by: Marland Kitchen MyChart Message (if you have MyChart) OR . A paper copy in the mail If you have any lab test that is abnormal or we need to change your treatment, we will call you to review the results.  Follow-Up: At [facility], you and your health needs are our priority.  As part of our continuing mission to provide you with exceptional heart care, we have created designated Provider Care Teams.  These Care Teams include your primary Cardiologist (physician) and Advanced Practice Providers (APPs -  Physician Assistants and Nurse Practitioners) who all work together to provide you with the care you need, when you need it.  We recommend signing up for the patient portal called "MyChart".  Sign up information is provided on this After Visit Summary.  MyChart is used to connect with patients for Virtual Visits (Telemedicine).  Patients are able to view lab/test results, encounter notes, upcoming appointments, etc.  Non-urgent messages can be sent to your provider as well.   To learn more about what you can do with MyChart, go to [email or web address].    Your next appointment:   6 month(s)  The format for your next appointment:   Either In Person or Virtual  Provider:   You may see Kirk Ruths, MD or one of the following Advanced Practice Providers on your designated Care Team:    Kerin Ransom, PA-C  [place], [place]  Coletta Memos, [place]

## 2020-02-10 DIAGNOSIS — H04121 Dry eye syndrome of right lacrimal gland: Secondary | ICD-10-CM | POA: Diagnosis not present

## 2020-02-10 DIAGNOSIS — H01001 Unspecified blepharitis right upper eyelid: Secondary | ICD-10-CM | POA: Diagnosis not present

## 2020-02-10 DIAGNOSIS — H01004 Unspecified blepharitis left upper eyelid: Secondary | ICD-10-CM | POA: Diagnosis not present

## 2020-02-17 DIAGNOSIS — H401134 Primary open-angle glaucoma, bilateral, indeterminate stage: Secondary | ICD-10-CM | POA: Diagnosis not present

## 2020-02-17 DIAGNOSIS — B0052 Herpesviral keratitis: Secondary | ICD-10-CM | POA: Diagnosis not present

## 2020-02-17 DIAGNOSIS — H04123 Dry eye syndrome of bilateral lacrimal glands: Secondary | ICD-10-CM | POA: Diagnosis not present

## 2020-03-24 ENCOUNTER — Telehealth: Payer: Self-pay | Admitting: Obstetrics & Gynecology

## 2020-03-24 NOTE — Progress Notes (Deleted)
85 y.o. G55P4 Married White or Caucasian female here for annual exam.    Patient's last menstrual period was 12/03/2002.          Sexually active: {yes no:314532}  The current method of family planning is {contraception:315051}.    Exercising: {yes no:314532}  {types:19826} Smoker:  {YES NO:22349}  Health Maintenance: Pap:  *** History of abnormal Pap:  {YES NO:22349} MMG:  *** Colonoscopy:  *** BMD:   *** TDaP:  *** Pneumonia vaccine(s):  *** Shingrix:   *** Hep C testing: *** Screening Labs: ***   reports that she quit smoking about 17 years ago. Her smoking use included cigarettes. She has a 40.00 pack-year smoking history. She has never used smokeless tobacco. She reports that she does not drink alcohol and does not use drugs.  Past Medical History:  Diagnosis Date  . Arthritis   . COPD (chronic obstructive pulmonary disease) ([place])   . Dyspnea on exertion   . GERD (gastroesophageal reflux disease)   . Glaucoma of both eyes   . History of atrial fibrillation without current medication 2010   POST SURG 2010--  PER PT NO ISSUES SINCE  . History of breast cancer    DX DCIS IN 2004--  S/P RIGHT MASTECTOMY , NO CHEMORADIATION---  NO RECURRENCE  . History of CHF (congestive heart failure)    POST SURG 2010  . History of hypertension   . History of shingles    02/ 2015--  back of neck and left flank--  no residual pain  . ICH (intracerebral hemorrhage) ([place]) 02/05/2015  . Nephrolithiasis   . Recurrent bladder papillary carcinoma ([place]) first dx 06/ 2014   s/p  turbt's  and instillation mitomycin c (chemo)//dx again in 2015-different type  . Stroke [facility])     Past Surgical History:  Procedure Laterality Date  . CATARACT EXTRACTION W/ INTRAOCULAR LENS  IMPLANT, BILATERAL    . CRANIECTOMY N/A 02/05/2015   Procedure: SUBOCCIPITAL CRANIECTOMY EVACUATION OF HEMATOMA;  Surgeon: Consuella Lose, MD;  Location: [facility];  Service: Neurosurgery;  Laterality: N/A;  . CYSTOSCOPY W/  RETROGRADES Bilateral 08/11/2014   Procedure: CYSTOSCOPY WITH BILATERAL RETROGRADE PYELOGRAM AND MITOMYCIN INSTILLATION;  Surgeon: Alexis Frock, MD;  Location: [facility];  Service: Urology;  Laterality: Bilateral;  . CYSTOSCOPY W/ URETERAL STENT PLACEMENT Bilateral 03/26/2013   Procedure: CYSTOSCOPY WITH BILATERAL RETROGRADE PYELOGRAM/URETERAL STENT PLACEMENT;  Surgeon: Alexis Frock, MD;  Location: [facility];  Service: Urology;  Laterality: Bilateral;  . CYSTOSCOPY W/ URETERAL STENT PLACEMENT Left 05/13/2013   Procedure: CYSTOSCOPY WITH RETROGRADE PYELOGRAM/URETERAL STENT PLACEMENT STENT EXCHANGE;  Surgeon: Alexis Frock, MD;  Location: [facility];  Service: Urology;  Laterality: Left;  . PARTIAL MASTECTOMY WITH NEEDLE LOCALIZATION Right 01-14-2003   DCIS  . REMOVAL CYST LEFT HAND  2013  . TOTAL KNEE ARTHROPLASTY Right 03-22-2009  . TOTAL MASTECTOMY Right 02-03-2003   W/ SLN BX  AND  POST 03-01-2003 EVACUATION HEMATOMA  . TOTAL MASTECTOMY Left 05/04/2019   Procedure: LEFT TOTAL MASTECTOMY;  Surgeon: Fanny Skates, MD;  Location: [facility] [place];  Service: General;  Laterality: Left;  . TRANSTHORACIC ECHOCARDIOGRAM  03-25-2009   MILD LVF/ EF 70-80%/ MILD INCREASE SYSTOLIC PULMONARY  PRESSURE  . TRANSURETHRAL RESECTION OF BLADDER TUMOR WITH GYRUS (TURBT-GYRUS) N/A 03/26/2013   Procedure: TRANSURETHRAL RESECTION OF BLADDER TUMOR WITH GYRUS (TURBT-GYRUS);  Surgeon: Alexis Frock, MD;  Location: [facility];  Service: Urology;  Laterality: N/A;  . TRANSURETHRAL RESECTION OF  BLADDER TUMOR WITH GYRUS (TURBT-GYRUS) N/A 05/13/2013   Procedure: TRANSURETHRAL RESECTION OF BLADDER TUMOR WITH GYRUS (TURBT-GYRUS)  RE-STAGING TRANSURETHRAL RESECTION OF BLADDER TUMOR, LEFT RETROGRADE PYELOGRAM AND STENT EXCHANGE;  Surgeon: Alexis Frock, MD;  Location: [facility];  Service: Urology;  Laterality: N/A;  . TRANSURETHRAL RESECTION OF BLADDER  TUMOR WITH GYRUS (TURBT-GYRUS) N/A 08/11/2014   Procedure: TRANSURETHRAL RESECTION OF BLADDER TUMOR WITH GYRUS (TURBT-GYRUS);  Surgeon: Alexis Frock, MD;  Location: [facility];  Service: Urology;  Laterality: N/A;  . TUBAL LIGATION      Current Outpatient Medications  Medication Sig Dispense Refill  . albuterol (VENTOLIN HFA) 108 (90 Base) MCG/ACT inhaler Inhale 2 puffs into the lungs every 6 (six) hours as needed for wheezing or shortness of breath. 8 g 2  . arformoterol (BROVANA) 15 MCG/2ML NEBU Take 2 mLs (15 mcg total) by nebulization 2 (two) times daily. 120 mL 5  . atorvastatin (LIPITOR) 10 MG tablet TAKE ONE TABLET DAILY AT 6PM 90 tablet 3  . Brinzolamide-Brimonidine (SIMBRINZA) 1-0.2 % SUSP Place 1 drop into both eyes in the morning and at bedtime.    . budesonide (PULMICORT) 0.5 MG/2ML nebulizer solution 2ML BY NEBULIZER TWICE DAILY (Patient taking differently: Take 0.5 mg by nebulization in the morning and at bedtime. ) 120 mL 5  . digoxin (LANOXIN) 0.125 MG tablet TAKE ONE TABLET DAILY 90 tablet 2  . diltiazem (CARDIZEM CD) 120 MG 24 hr capsule Take 1 capsule (120 mg total) by mouth daily. 30 capsule 11  . fluorometholone (FML) 0.1 % ophthalmic suspension Place 1 drop into both eyes daily.    . furosemide (LASIX) 40 MG tablet Take 40 mg by mouth in the morning and 20 mg in the evening    . guaiFENesin (MUCINEX) 600 MG 12 hr tablet Take 600 mg by mouth daily.    Marland Kitchen ipratropium (ATROVENT) 0.02 % nebulizer solution Take 2.5 mLs (0.5 mg total) by nebulization 3 (three) times daily. DX: J44.9 225 mL 5  . latanoprost (XALATAN) 0.005 % ophthalmic solution Place 1 drop into both eyes at bedtime.    . OXYGEN Inhale 2 L into the lungs continuous.     . pantoprazole (PROTONIX) 40 MG tablet Take 40 mg by mouth daily.    . potassium chloride SA (KLOR-CON) 20 MEQ tablet Take 20 meq by mouth in the morning and 10 meq in the evening    . SHINGRIX injection Inject 0.5 mLs into the  muscle once.     . valACYclovir (VALTREX) 1000 MG tablet Take 1,000 mg by mouth daily.     No current facility-administered medications for this visit.    Family History  Problem Relation Age of Onset  . Colon cancer Mother 56  . Colon cancer Maternal Grandmother   . Stroke Maternal Grandmother   . Breast cancer Maternal Aunt 54  . Cancer Maternal Aunt        ? ovarian  . Breast cancer Other 60       breast cancer, ovarian cancer  . Breast cancer Daughter 32  . Stroke Paternal Grandmother   . Prostate cancer Paternal Grandfather   . Heart attack Neg Hx   . Hypertension Neg Hx     Review of Systems  Exam:   LMP 12/03/2002      General appearance: alert, cooperative and appears stated age Head: Normocephalic, without obvious abnormality, atraumatic Neck: no adenopathy, supple, symmetrical, trachea midline and thyroid {EXAM; THYROID:18604} Lungs: clear to auscultation  bilaterally Breasts: {Exam; breast:13139::"normal appearance, no masses or tenderness"} Heart: regular rate and rhythm Abdomen: soft, non-tender; bowel sounds normal; no masses,  no organomegaly Extremities: extremities normal, atraumatic, no cyanosis or edema Skin: Skin color, texture, turgor normal. No rashes or lesions Lymph nodes: Cervical, supraclavicular, and axillary nodes normal. No abnormal inguinal nodes palpated Neurologic: Grossly normal   Pelvic: External genitalia:  no lesions              Urethra:  normal appearing urethra with no masses, tenderness or lesions              Bartholins and Skenes: normal                 Vagina: normal appearing vagina with normal color and discharge, no lesions              Cervix: {exam; cervix:14595}              Pap taken: {yes no:314532} Bimanual Exam:  Uterus:  {exam; uterus:12215}              Adnexa: {exam; adnexa:12223}               Rectovaginal: Confirms               Anus:  normal sphincter tone, no lesions  Chaperone, ***Terence Lux, CMA,  was present for exam.  A:  Well Woman with normal exam  P:   {plan; gyn:5269::"mammogram","pap smear","return annually or prn"}

## 2020-03-24 NOTE — Telephone Encounter (Addendum)
Patient has an aex 03/28/20 and "no longer needs a pap msear". She wonders if she really needs to be seen for her aex at this point? She does not think she will be able to make it to her appointment on Monday.

## 2020-03-25 NOTE — Telephone Encounter (Signed)
Spoke with patient. Patient states she does not have transportation for her appt on 03/28/20, request to cancel. Patient declines to reschedule at this time. Patient request to be added to wait list for future appt with Dr. Sabra Heck, added to wait list.  Patient denies any current GYN concerns. Per 03/24/19 AEX notes, pap no longer indicated. Patient states she has not seen her PCP. Was advised AEX still recommended. Patient thankful for call, is aware to contact the office if any future concerns.   AEX cancelled for 03/28/20.   Routing to provider for final review. Patient is agreeable to disposition. Will close encounter.

## 2020-03-28 ENCOUNTER — Ambulatory Visit: Payer: Medicare Other | Admitting: Obstetrics & Gynecology

## 2020-05-03 IMAGING — DX DG CHEST 2V
1 series · 1 of 1 positions shown · non-contrast
Comparison: 10/27/2019

CLINICAL DATA: Acute heart failure and shortness of breath.

EXAM:
CHEST - 2 VIEW

[dg chest 2 view]
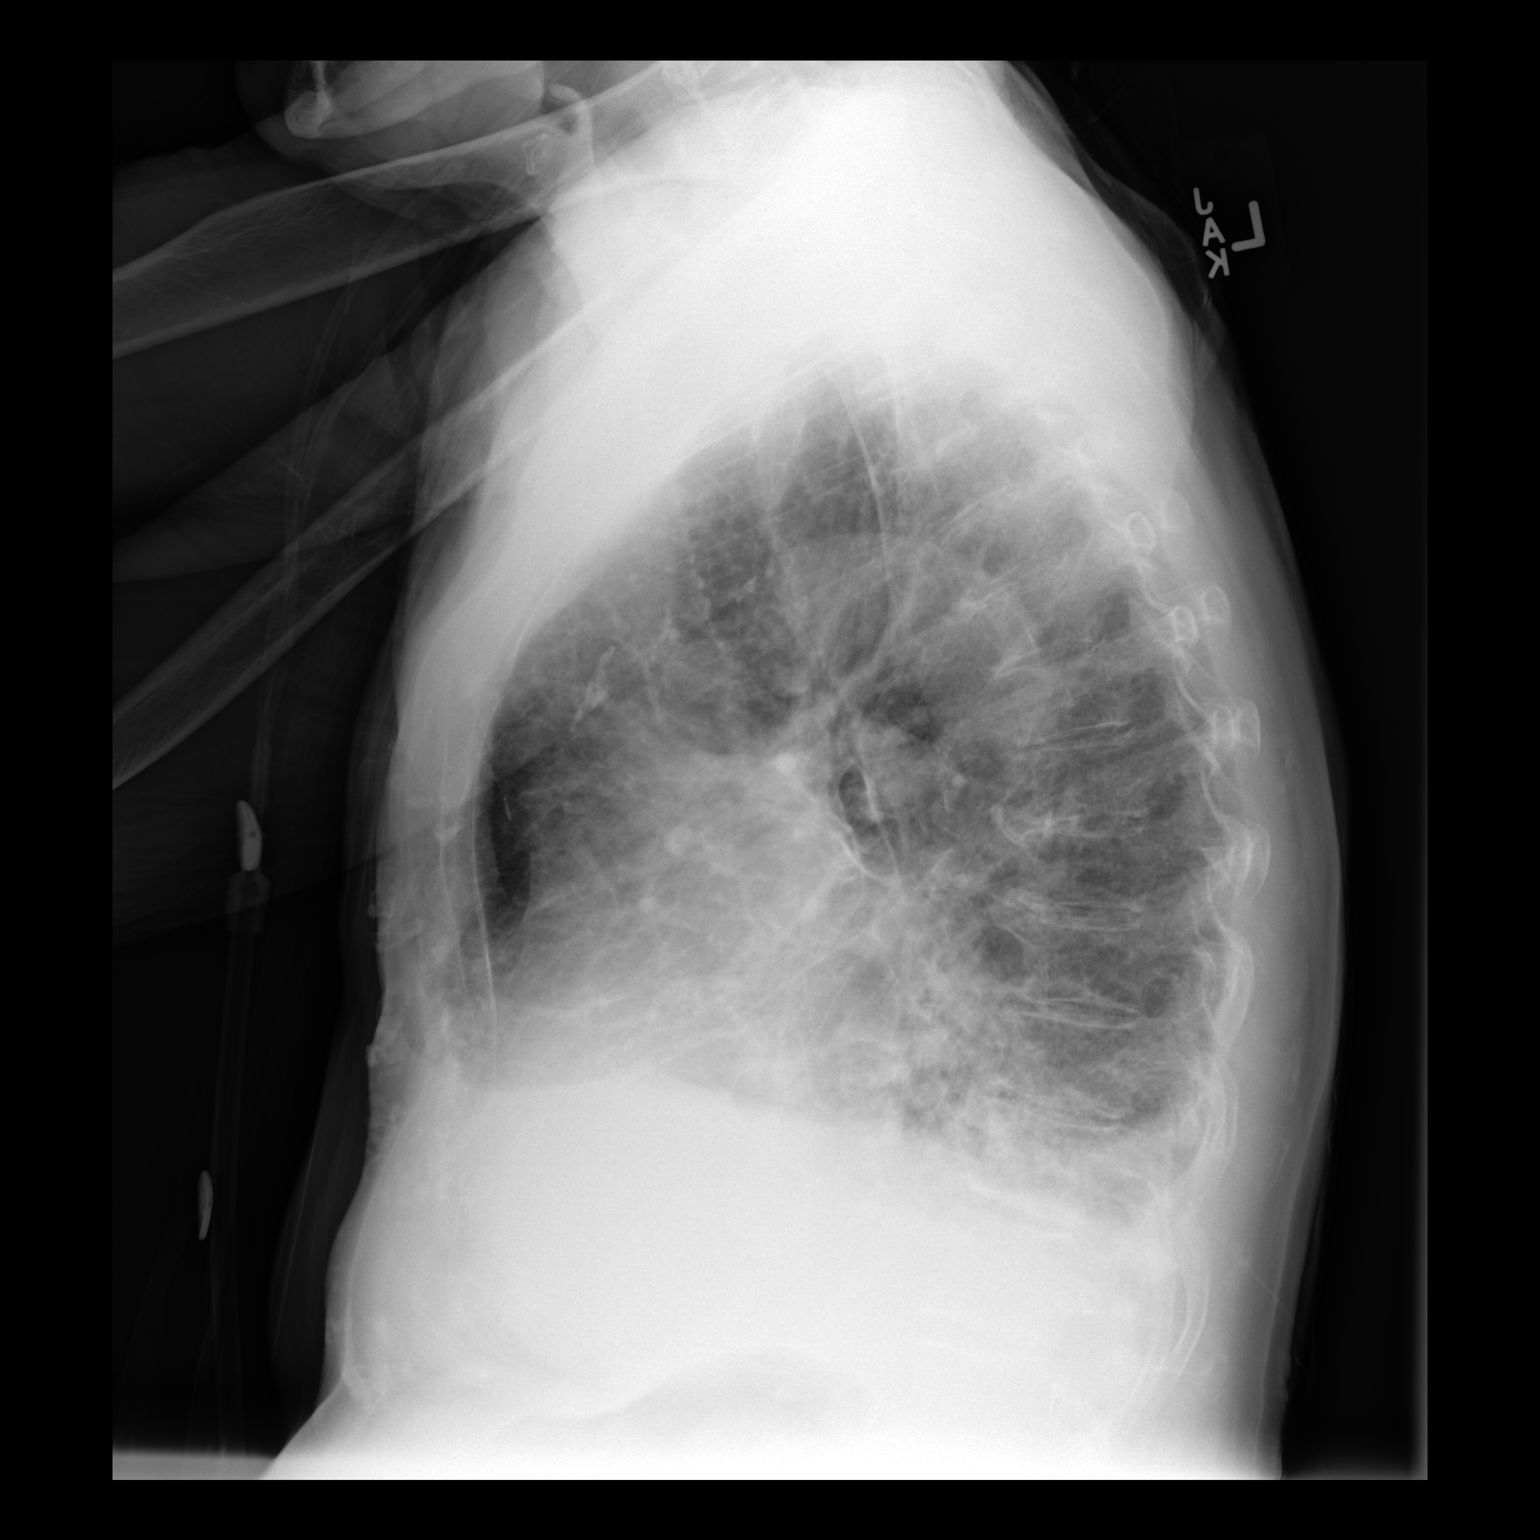

[1 of 1 positions shown; findings below may reference images not displayed]

FINDINGS: Cardiomegaly and aortic atherosclerosis. Pulmonary venous
hypertension with interstitial pulmonary edema. Small effusions
layering dependently with dependent atelectasis. Findings slightly
worsened compared to the study [DATE].
IMPRESSION: Congestive heart failure with interstitial edema and effusions,
slightly worsened.

## 2020-05-25 ENCOUNTER — Other Ambulatory Visit: Payer: Self-pay | Admitting: Nurse Practitioner

## 2020-05-25 DIAGNOSIS — Z170 Estrogen receptor positive status [ER+]: Secondary | ICD-10-CM

## 2020-05-25 NOTE — Progress Notes (Addendum)
[name]   [phone] [phone]   Clinic Follow up Note   Patient Care Team: Janith Lima, MD as PCP - General (Internal Medicine) Stanford Breed, Denice Bors, MD as PCP - Cardiology (Cardiology) Rockwell Germany, RN as Oncology Nurse Navigator Mauro Kaufmann, RN as Oncology Nurse Navigator Truitt Merle, MD as Consulting Physician (Hematology) Fanny Skates, MD as Consulting Physician (General Surgery) Kyung Rudd, MD as Consulting Physician (Radiation Oncology) Alexis Frock, MD as Consulting Physician (Anesthesiology) Alexis Frock, MD as Consulting Physician (Urology) [name], MD as Consulting Physician (Cardiology) Rigoberto Noel, MD as Consulting Physician (Pulmonary Disease) 05/26/2020  CHIEF COMPLAINT: Follow-up left breast cancer  SUMMARY OF ONCOLOGIC HISTORY: Oncology History Overview Note  Cancer Staging Malignant neoplasm of upper-outer quadrant of left breast in female, estrogen receptor positive ([place]) Staging form: Breast, AJCC 8th Edition - Clinical stage from 04/01/2019: Stage IA (cT1c, cN0, cM0, G2, ER+, PR+, HER2-) - Signed by Truitt Merle, MD on 04/07/2019 - Pathologic stage from 05/04/2019: Stage Unknown (pT2, pNX, cM0, G2, ER+, PR+, HER2-) - Signed by Truitt Merle, MD on 05/27/2019    Malignant neoplasm of upper-outer quadrant of left breast in female, estrogen receptor positive ([place])  03/30/2019 Mammogram   Left diagnostic mammogram and [place] 03/30/19  IMPRESSION The 1.6cm heterogenous focal area in the left breast at 1:00-2:00 4-6cm form the nipple, corresponding to the hard palpable area in the left breast is suspicious of malignancy, particularly lobular carcinoma.     04/01/2019 Cancer Staging   Staging form: Breast, AJCC 8th Edition - Clinical stage from 04/01/2019: Stage IA (cT1c, cN0, cM0, G2, ER+, PR+, HER2-) - Signed by Truitt Merle, MD on 04/07/2019   04/01/2019 Initial Biopsy   Diagnosis 04/01/19 Breast, left, needle  core biopsy, 2 o'clock, 5cmfn - INVASIVE MAMMARY CARCINOMA, SEE COMMENT. - MAMMARY CARCINOMA IN SITU.   04/01/2019 Receptors her2   The tumor cells are NEGATIVE for Her2 (1+). Estrogen Receptor: 100%, POSITIVE, STRONG STAINING INTENSITY Progesterone Receptor: 30%, POSITIVE, STRONG STAINING INTENSITY Proliferation Marker Ki67: 20%   04/03/2019 Initial Diagnosis   Malignant neoplasm of upper-outer quadrant of left breast in female, estrogen receptor positive ([facility])    Genetic Testing   Negative genetic testing. No pathogenic variants identified on the [facility] Breast Cancer STAT Panel + Common Hereditary Cancers Panel. The STAT Breast cancer panel offered by [facility] includes sequencing and rearrangement analysis for the following 9 genes:  ATM, BRCA1, BRCA2, CDH1, CHEK2, PALB2, PTEN, STK11 and TP53.  The Common Hereditary Cancers Panel offered by [facility] includes sequencing and/or deletion duplication testing of the following 47 genes: APC, ATM, AXIN2, BARD1, BMPR1A, BRCA1, BRCA2, BRIP1, CDH1, CDKN2A (p14ARF), CDKN2A (p16INK4a), CKD4, CHEK2, CTNNA1, DICER1, EPCAM (Deletion/duplication testing only), GREM1 (promoter region deletion/duplication testing only), KIT, MEN1, MLH1, MSH2, MSH3, MSH6, MUTYH, NBN, NF1, NHTL1, PALB2, PDGFRA, PMS2, POLD1, POLE, PTEN, RAD50, RAD51C, RAD51D,  SDHB, SDHC, SDHD, SMAD4, SMARCA4. STK11, TP53, TSC1, TSC2, and VHL.  The following genes were evaluated for sequence changes only: SDHA and HOXB13 c.251G>A variant only. The report date is 04/17/2019.    05/04/2019 Surgery    LEFT TOTAL MASTECTOMY by Dr. Dalbert Batman 05/04/19   05/04/2019 Pathology Results   Diagnosis 05/04/19 1. Breast, excision - INVASIVE LOBULAR CARCINOMA, NOTTINGHAM GRADE 2 - SEE COMMENT 2. Breast, simple mastectomy, Left total - MULTIFOCAL, INVASIVE LOBULAR CARCINOMA, NOTTINGHAM GRADE 2 OF 3, 2.1 CM - MARGINS UNINVOLVED BY CARCINOMA (0.2 CM; POSTERIOR MARGIN) - LYMPHOVASCULAR SPACE INVASION PRESENT -  PREVIOUS  BIOPSY SITE CHANGES PRESENT - SEBORRHEIC KERATOSIS - SEE ONCOLOGY TABLE AND COMMENT BELOW    Anti-estrogen oral therapy   She declined antiestrogen therapy.    05/04/2019 Cancer Staging   Staging form: Breast, AJCC 8th Edition - Pathologic stage from 05/04/2019: Stage Unknown (pT2, pNX, cM0, G2, ER+, PR+, HER2-) - Signed by Truitt Merle, MD on 05/27/2019     CURRENT THERAPY: Patient declined antiestrogen therapy due to age and medical co-morbidities, on surveillance  INTERVAL HISTORY: [name] returns for follow-up as scheduled.  She was last seen by Dr. Burr Medico on 05/27/2019.  She was hospitalized in April for acute on chronic CHF and COPD exacerbation, no other changes in her health from baseline.  She continues 2 L continuous oxygen.  Her appetite and energy are normal for her.  Denies new lump/mass on the chest wall.  She has occasional left chest/axilla pains from her mastectomy. Denies any new bone pain, abdominal pain, changes in her bowel habits, unintentional weight loss.     MEDICAL HISTORY:  Past Medical History:  Diagnosis Date  . Arthritis   . COPD (chronic obstructive pulmonary disease) ([place])   . Dyspnea on exertion   . GERD (gastroesophageal reflux disease)   . Glaucoma of both eyes   . History of atrial fibrillation without current medication 2010   POST SURG 2010--  PER PT NO ISSUES SINCE  . History of breast cancer    DX DCIS IN 2004--  S/P RIGHT MASTECTOMY , NO CHEMORADIATION---  NO RECURRENCE  . History of CHF (congestive heart failure)    POST SURG 2010  . History of hypertension   . History of shingles    02/ 2015--  back of neck and left flank--  no residual pain  . ICH (intracerebral hemorrhage) ([place]) 02/05/2015  . Nephrolithiasis   . Recurrent bladder papillary carcinoma ([place]) first dx 06/ 2014   s/p  turbt's  and instillation mitomycin c (chemo)//dx again in 2015-different type  . Stroke [facility])     SURGICAL HISTORY: Past Surgical History:  Procedure  Laterality Date  . CATARACT EXTRACTION W/ INTRAOCULAR LENS  IMPLANT, BILATERAL    . CRANIECTOMY N/A 02/05/2015   Procedure: SUBOCCIPITAL CRANIECTOMY EVACUATION OF HEMATOMA;  Surgeon: Consuella Lose, MD;  Location: [place] [facility];  Service: Neurosurgery;  Laterality: N/A;  . CYSTOSCOPY W/ RETROGRADES Bilateral 08/11/2014   Procedure: CYSTOSCOPY WITH BILATERAL RETROGRADE PYELOGRAM AND MITOMYCIN INSTILLATION;  Surgeon: Alexis Frock, MD;  Location: [facility];  Service: Urology;  Laterality: Bilateral;  . CYSTOSCOPY W/ URETERAL STENT PLACEMENT Bilateral 03/26/2013   Procedure: CYSTOSCOPY WITH BILATERAL RETROGRADE PYELOGRAM/URETERAL STENT PLACEMENT;  Surgeon: Alexis Frock, MD;  Location[facility];  Service: Urology;  Laterality: Bilateral;  . CYSTOSCOPY W/ URETERAL STENT PLACEMENT Left 05/13/2013   Procedure: CYSTOSCOPY WITH RETROGRADE PYELOGRAM/URETERAL STENT PLACEMENT STENT EXCHANGE;  Surgeon: Alexis Frock, MD;  Location: [facility];  Service: Urology;  Laterality: Left;  . PARTIAL MASTECTOMY WITH NEEDLE LOCALIZATION Right 01-14-2003   DCIS  . REMOVAL CYST LEFT HAND  2013  . TOTAL KNEE ARTHROPLASTY Right 03-22-2009  . TOTAL MASTECTOMY Right 02-03-2003   W/ SLN BX  AND  POST 03-01-2003 EVACUATION HEMATOMA  . TOTAL MASTECTOMY Left 05/04/2019   Procedure: LEFT TOTAL MASTECTOMY;  Surgeon: Fanny Skates, MD;  Location: [place];  Service: General;  Laterality: Left;  . TRANSTHORACIC ECHOCARDIOGRAM  03-25-2009   MILD LVF/ EF 70-80%/ MILD INCREASE SYSTOLIC PULMONARY  PRESSURE  .  TRANSURETHRAL RESECTION OF BLADDER TUMOR WITH GYRUS (TURBT-GYRUS) N/A 03/26/2013   Procedure: TRANSURETHRAL RESECTION OF BLADDER TUMOR WITH GYRUS (TURBT-GYRUS);  Surgeon: Alexis Frock, MD;  Location: [facility];  Service: Urology;  Laterality: N/A;  . TRANSURETHRAL RESECTION OF BLADDER TUMOR WITH GYRUS (TURBT-GYRUS) N/A 05/13/2013   Procedure: TRANSURETHRAL  RESECTION OF BLADDER TUMOR WITH GYRUS (TURBT-GYRUS)  RE-STAGING TRANSURETHRAL RESECTION OF BLADDER TUMOR, LEFT RETROGRADE PYELOGRAM AND STENT EXCHANGE;  Surgeon: Alexis Frock, MD;  Location: [facility];  Service: Urology;  Laterality: N/A;  . TRANSURETHRAL RESECTION OF BLADDER TUMOR WITH GYRUS (TURBT-GYRUS) N/A 08/11/2014   Procedure: TRANSURETHRAL RESECTION OF BLADDER TUMOR WITH GYRUS (TURBT-GYRUS);  Surgeon: Alexis Frock, MD;  Location: [facility];  Service: Urology;  Laterality: N/A;  . TUBAL LIGATION      I have reviewed the social history and family history with the patient and they are unchanged from previous note.  ALLERGIES:  is allergic to heparin and morphine and related.  MEDICATIONS:  Current Outpatient Medications  Medication Sig Dispense Refill  . arformoterol (BROVANA) 15 MCG/2ML NEBU Take 2 mLs (15 mcg total) by nebulization 2 (two) times daily. 120 mL 5  . atorvastatin (LIPITOR) 10 MG tablet TAKE ONE TABLET DAILY AT 6PM 90 tablet 3  . Brinzolamide-Brimonidine (SIMBRINZA) 1-0.2 % SUSP Place 1 drop into both eyes in the morning and at bedtime.    . budesonide (PULMICORT) 0.5 MG/2ML nebulizer solution 2ML BY NEBULIZER TWICE DAILY (Patient taking differently: Take 0.5 mg by nebulization in the morning and at bedtime. ) 120 mL 5  . digoxin (LANOXIN) 0.125 MG tablet TAKE ONE TABLET DAILY 90 tablet 2  . diltiazem (CARDIZEM CD) 120 MG 24 hr capsule Take 1 capsule (120 mg total) by mouth daily. 30 capsule 11  . fluorometholone (FML) 0.1 % ophthalmic suspension Place 1 drop into both eyes daily.    . furosemide (LASIX) 40 MG tablet Take 40 mg by mouth in the morning and 20 mg in the evening    . guaiFENesin (MUCINEX) 600 MG 12 hr tablet Take 600 mg by mouth daily.    Marland Kitchen ipratropium (ATROVENT) 0.02 % nebulizer solution Take 2.5 mLs (0.5 mg total) by nebulization 3 (three) times daily. DX: J44.9 225 mL 5  . latanoprost (XALATAN) 0.005 % ophthalmic  solution Place 1 drop into both eyes at bedtime.    . OXYGEN Inhale 2 L into the lungs continuous.     . pantoprazole (PROTONIX) 40 MG tablet Take 40 mg by mouth daily.    . Polyethylene Glycol 3350 (MIRALAX PO) Take 17 g by mouth daily.    . potassium chloride SA (KLOR-CON) 20 MEQ tablet Take 20 meq by mouth in the morning and 10 meq in the evening    . valACYclovir (VALTREX) 1000 MG tablet Take 1,000 mg by mouth daily.    Marland Kitchen albuterol (VENTOLIN HFA) 108 (90 Base) MCG/ACT inhaler Inhale 2 puffs into the lungs every 6 (six) hours as needed for wheezing or shortness of breath. (Patient not taking: Reported on 05/26/2020) 8 g 2  . SHINGRIX injection Inject 0.5 mLs into the muscle once.      No current facility-administered medications for this visit.    PHYSICAL EXAMINATION:  Vitals:   05/26/20 1458  BP: (!) 91/53  Pulse: 69  Resp: 17  Temp: 98.5 F (36.9 C)  SpO2: 98%   Filed Weights   05/26/20 1458  Weight: 138 lb 9.6 oz (62.9 kg)  GENERAL:alert, no distress and comfortable SKIN: No rash to exposed skin EYES: sclera clear NECK: Without mass LYMPH:  no palpable cervical or supraclavicular lymphadenopathy  LUNGS: clear with normal breathing effort HEART: regular rate & rhythm, no lower extremity edema Musculoskeletal: Cyanotic distal metatarsals NEURO: alert & oriented x 3 with fluent speech Breast exam: S/p bilateral mastectomy.  Incisions completely healed.  Soft tissue nodules along the mastectomy site. Faint scattered small to medium size erythematous skin eruptions surrounding the incision and more distal on the left chest wall        LABORATORY DATA:  I have reviewed the data as listed CBC Latest Ref Rng & Units 05/26/2020 12/14/2019 12/10/2019  WBC 4.0 - 10.5 K/uL 8.9 9.0 9.3  Hemoglobin 12.0 - 15.0 g/dL 14.4 13.2 13.5  Hematocrit 36 - 46 % 44.2 43.0 44.1  Platelets 150 - 400 K/uL 235 296 278     CMP Latest Ref Rng & Units 05/26/2020 12/22/2019 12/17/2019    Glucose 70 - 99 mg/dL 91 89 86  BUN 8 - 23 mg/dL _0 Creatinine 0.44 - 1.00 mg/dL 0.84 0.92 0.83  Sodium 135 - 145 mmol/L 139 142 143  Potassium 3.5 - 5.1 mmol/L 4.1 4.6 4.2  Chloride 98 - 111 mmol/L 102 100 101  CO2 22 - 32 mmol/L 31 27 34(H)  Calcium 8.9 - 10.3 mg/dL 9.0 9.2 8.9  Total Protein 6.5 - 8.1 g/dL 6.9 - -  Total Bilirubin 0.3 - 1.2 mg/dL 1.0 - -  Alkaline Phos 38 - 126 U/L 137(H) - -  AST 15 - 41 U/L 21 - -  ALT 0 - 44 U/L 15 - -      RADIOGRAPHIC STUDIES: I have personally reviewed the radiological images as listed and agreed with the findings in the report. No results found.   ASSESSMENT & PLAN: [name] is a 85 y.o. female with   1Malignant neoplasm of upper-outer quadrant of left breast,lobular carcinoma,StageIApT2cN0M0, ER/PR+, HER2-,Grade II -She was diagnosed in 03/2019. She underwent left total mastectomy by Dr Dalbert Batman on 05/04/19. Surgical pathology showed 2 grade II invasive lobular carcinoma masses with clear margins, tumor was completely resected.   -Oncotype was not recommend due to her advanced age and medical comorbidities -Given early stage disease, post mastectomy radiation was not recommended.  -Given the strong ER and PR positivity, adjuvant aromatase inhibitor was recommended to reduce her risk of cancer recurrence, after lengthy discussion with Dr. Burr Medico, patient opted to forego antiestrogen therapy due to age and medical co-morbidities -No role for mammogram after bilateral mastectomy -Continue surveillance  2. H/o of right breast DCIS -diagnosedin 2004  -s/p rightlumpectomy by Dr. Floyde Parkins 01/14/2003 then right mastectomy on 02/03/2003, no chemo or RT or AI  3. H/o of Recurrent bladder papillary carcinoma -Initiallydiagnosed on 03/26/13 with high grade papillary urothelial carcinoma -Diagnosedrecurrent low grade papillary urothelial carcinoma on 08/11/14 -underwent transurethral resection of bladder with Gyrus and stent  placement.  -She continues to be followed by her Urologist Dr. Tresa Moore   4. Genetic testing was negative for pathogenetic mutations  5. H/o of ICH and stroke, S/p craniotomy in 02/2015  6. HTN, Afib -Managed by cardiologist Dr. Stanford Breed -on Lipitor, Cardizem, Protonix  -Hospitalized in 12/2019 for acute on chronic CHF and COPD exacerbation  7. Shingles and GERD and arthritis  -On Valtrex once daily and Protonix  -She stays up to date with shingles  -Managed by her PCP   8. COPD -She has 40  year h/o smoking and quit 18 years ago -On continuous O2 and respiratory inhalers, Managed by Dr. Elsworth Soho -Stable  Disposition: [name] is clinically doing well. We reviewed the CBC and CMP from today which shows mild intermittent elevated alk phos, otherwise unremarkable. Exam shows nodularity along the left mastectomy site, possibly scar tissue, and faint skin eruption on the chest wall. See image above. We are referring her for left [place] and possible biopsy to r/o local recurrence.   She will return in 2 weeks for COVID19 booster vaccine and f/u to review results. If negative, continue surveillance and f/u in a year.   Orders Placed This Encounter  Procedures  . US BREAST LTD UNI LEFT INC AXILLA    Possible biopsy of nodule on left mastectomy scar to r/o recurrence    Standing Status:   Future    Standing Expiration Date:   05/26/2021    Scheduling Instructions:     [facility]    Order Specific Question:   Reason for Exam (SYMPTOM  OR DIAGNOSIS REQUIRED)    Answer:   bilat mastectomy, left breast invasive lobular carcinoma with nodules on the incision and skin eruptions, r/o local recurrence    Order Specific Question:   Preferred imaging location?    Answer:   External   All questions were answered. The patient knows to call the clinic with any problems, questions or concerns. No barriers to learning were detected.     Alla Feeling, NP 05/26/20   Addendum  I have seen the patient,  examined her. I agree with the assessment and and plan and have edited the notes.   She is clinically doing well, lab reviewed, her exam showed multiple nodules alone her mastectomy incision and mild skin thickning and erythema. Will obtain US of left chest wall for possible biopsy to rule out recurrent breast cancer. She agrees with the plan.   Truitt Merle  05/26/2020

## 2020-05-26 ENCOUNTER — Encounter: Payer: Self-pay | Admitting: Nurse Practitioner

## 2020-05-26 ENCOUNTER — Other Ambulatory Visit: Payer: Self-pay

## 2020-05-26 ENCOUNTER — Inpatient Hospital Stay (HOSPITAL_BASED_OUTPATIENT_CLINIC_OR_DEPARTMENT_OTHER): Payer: Medicare Other | Admitting: Nurse Practitioner

## 2020-05-26 ENCOUNTER — Inpatient Hospital Stay: Payer: Medicare Other | Attending: Nurse Practitioner

## 2020-05-26 VITALS — BP 91/53 | HR 69 | Temp 98.5°F | Resp 17 | Ht 66.0 in | Wt 138.6 lb

## 2020-05-26 DIAGNOSIS — J449 Chronic obstructive pulmonary disease, unspecified: Secondary | ICD-10-CM | POA: Insufficient documentation

## 2020-05-26 DIAGNOSIS — R748 Abnormal levels of other serum enzymes: Secondary | ICD-10-CM | POA: Diagnosis not present

## 2020-05-26 DIAGNOSIS — Z8551 Personal history of malignant neoplasm of bladder: Secondary | ICD-10-CM | POA: Insufficient documentation

## 2020-05-26 DIAGNOSIS — Z86000 Personal history of in-situ neoplasm of breast: Secondary | ICD-10-CM | POA: Insufficient documentation

## 2020-05-26 DIAGNOSIS — Z170 Estrogen receptor positive status [ER+]: Secondary | ICD-10-CM | POA: Insufficient documentation

## 2020-05-26 DIAGNOSIS — I10 Essential (primary) hypertension: Secondary | ICD-10-CM | POA: Insufficient documentation

## 2020-05-26 DIAGNOSIS — I4891 Unspecified atrial fibrillation: Secondary | ICD-10-CM | POA: Insufficient documentation

## 2020-05-26 DIAGNOSIS — C50412 Malignant neoplasm of upper-outer quadrant of left female breast: Secondary | ICD-10-CM | POA: Diagnosis not present

## 2020-05-26 DIAGNOSIS — K219 Gastro-esophageal reflux disease without esophagitis: Secondary | ICD-10-CM | POA: Insufficient documentation

## 2020-05-26 DIAGNOSIS — Z87891 Personal history of nicotine dependence: Secondary | ICD-10-CM | POA: Insufficient documentation

## 2020-05-26 DIAGNOSIS — Z9013 Acquired absence of bilateral breasts and nipples: Secondary | ICD-10-CM | POA: Insufficient documentation

## 2020-05-26 DIAGNOSIS — Z9981 Dependence on supplemental oxygen: Secondary | ICD-10-CM | POA: Insufficient documentation

## 2020-05-26 DIAGNOSIS — R229 Localized swelling, mass and lump, unspecified: Secondary | ICD-10-CM | POA: Insufficient documentation

## 2020-05-26 LAB — CBC WITH DIFFERENTIAL (CANCER CENTER ONLY)
Abs Immature Granulocytes: 0.01 10*3/uL (ref 0.00–0.07)
Basophils Absolute: 0.1 10*3/uL (ref 0.0–0.1)
Basophils Relative: 1 %
Eosinophils Absolute: 0.1 10*3/uL (ref 0.0–0.5)
Eosinophils Relative: 1 %
HCT: 44.2 % (ref 36.0–46.0)
Hemoglobin: 14.4 g/dL (ref 12.0–15.0)
Immature Granulocytes: 0 %
Lymphocytes Relative: 20 %
Lymphs Abs: 1.7 10*3/uL (ref 0.7–4.0)
MCH: 32.7 pg (ref 26.0–34.0)
MCHC: 32.6 g/dL (ref 30.0–36.0)
MCV: 100.2 fL — ABNORMAL HIGH (ref 80.0–100.0)
Monocytes Absolute: 0.9 10*3/uL (ref 0.1–1.0)
Monocytes Relative: 10 %
Neutro Abs: 6.1 10*3/uL (ref 1.7–7.7)
Neutrophils Relative %: 68 %
Platelet Count: 235 10*3/uL (ref 150–400)
RBC: 4.41 MIL/uL (ref 3.87–5.11)
RDW: 13.7 % (ref 11.5–15.5)
WBC Count: 8.9 10*3/uL (ref 4.0–10.5)
nRBC: 0 % (ref 0.0–0.2)

## 2020-05-26 LAB — CMP (CANCER CENTER ONLY)
ALT: 15 U/L (ref 0–44)
AST: 21 U/L (ref 15–41)
Albumin: 3.6 g/dL (ref 3.5–5.0)
Alkaline Phosphatase: 137 U/L — ABNORMAL HIGH (ref 38–126)
Anion gap: 6 (ref 5–15)
BUN: 20 mg/dL (ref 8–23)
CO2: 31 mmol/L (ref 22–32)
Calcium: 9 mg/dL (ref 8.9–10.3)
Chloride: 102 mmol/L (ref 98–111)
Creatinine: 0.84 mg/dL (ref 0.44–1.00)
GFR, Est AFR Am: >60 mL/min (ref >60)
GFR, Estimated: >60 mL/min (ref >60)
Glucose, Bld: 91 mg/dL (ref 70–99)
Potassium: 4.1 mmol/L (ref 3.5–5.1)
Sodium: 139 mmol/L (ref 135–145)
Total Bilirubin: 1 mg/dL (ref 0.3–1.2)
Total Protein: 6.9 g/dL (ref 6.5–8.1)

## 2020-05-26 NOTE — Progress Notes (Signed)
Patient declined flu vaccine today. Prefers to finish out her COVID vaccines first.

## 2020-05-27 ENCOUNTER — Telehealth: Payer: Self-pay | Admitting: Nurse Practitioner

## 2020-05-27 NOTE — Telephone Encounter (Signed)
Faxed to [facility] for ultrasound. Pt is aware of appts added on 10/7. Reminded pt to bring vaccination card to appt.

## 2020-05-30 ENCOUNTER — Telehealth: Payer: Self-pay | Admitting: Internal Medicine

## 2020-05-30 ENCOUNTER — Other Ambulatory Visit: Payer: Self-pay | Admitting: Internal Medicine

## 2020-05-30 DIAGNOSIS — J418 Mixed simple and mucopurulent chronic bronchitis: Secondary | ICD-10-CM

## 2020-05-30 NOTE — Telephone Encounter (Signed)
Mary with [name] called about budesonide (PULMICORT) 0.5 MG/2ML nebulizer solution She said that it is billed to Medicare B and they are needing a diagnosis code and that the quantity needs to be 120 instead of 180. Can the prescription be resent with 120 quantity and the diagnosis code be on the prescription.

## 2020-05-31 ENCOUNTER — Other Ambulatory Visit: Payer: Self-pay | Admitting: Internal Medicine

## 2020-05-31 DIAGNOSIS — J418 Mixed simple and mucopurulent chronic bronchitis: Secondary | ICD-10-CM

## 2020-05-31 DIAGNOSIS — N6489 Other specified disorders of breast: Secondary | ICD-10-CM | POA: Diagnosis not present

## 2020-05-31 MED ORDER — BUDESONIDE 0.5 MG/2ML IN SUSP: 0.5000 mg | Freq: Two times a day (BID) | RESPIRATORY_TRACT | 1 refills | Status: DC

## 2020-06-06 ENCOUNTER — Other Ambulatory Visit: Payer: Self-pay | Admitting: Internal Medicine

## 2020-06-09 ENCOUNTER — Ambulatory Visit: Payer: Medicare Other | Admitting: Hematology

## 2020-06-09 ENCOUNTER — Other Ambulatory Visit: Payer: Self-pay

## 2020-06-09 ENCOUNTER — Inpatient Hospital Stay: Payer: Medicare Other | Attending: Nurse Practitioner

## 2020-06-09 DIAGNOSIS — C50812 Malignant neoplasm of overlapping sites of left female breast: Secondary | ICD-10-CM | POA: Diagnosis not present

## 2020-06-09 DIAGNOSIS — K219 Gastro-esophageal reflux disease without esophagitis: Secondary | ICD-10-CM | POA: Diagnosis not present

## 2020-06-09 DIAGNOSIS — J449 Chronic obstructive pulmonary disease, unspecified: Secondary | ICD-10-CM | POA: Diagnosis not present

## 2020-06-09 DIAGNOSIS — Z8673 Personal history of transient ischemic attack (TIA), and cerebral infarction without residual deficits: Secondary | ICD-10-CM | POA: Diagnosis not present

## 2020-06-09 DIAGNOSIS — Z86000 Personal history of in-situ neoplasm of breast: Secondary | ICD-10-CM | POA: Diagnosis not present

## 2020-06-09 DIAGNOSIS — Z9981 Dependence on supplemental oxygen: Secondary | ICD-10-CM | POA: Diagnosis not present

## 2020-06-09 DIAGNOSIS — I10 Essential (primary) hypertension: Secondary | ICD-10-CM | POA: Insufficient documentation

## 2020-06-09 DIAGNOSIS — Z87891 Personal history of nicotine dependence: Secondary | ICD-10-CM | POA: Insufficient documentation

## 2020-06-09 DIAGNOSIS — Z8551 Personal history of malignant neoplasm of bladder: Secondary | ICD-10-CM | POA: Diagnosis not present

## 2020-06-09 DIAGNOSIS — Z23 Encounter for immunization: Secondary | ICD-10-CM | POA: Diagnosis not present

## 2020-06-09 DIAGNOSIS — I4891 Unspecified atrial fibrillation: Secondary | ICD-10-CM | POA: Diagnosis not present

## 2020-06-13 ENCOUNTER — Telehealth: Payer: Self-pay | Admitting: Hematology

## 2020-06-13 ENCOUNTER — Telehealth: Payer: Self-pay

## 2020-06-13 NOTE — Telephone Encounter (Signed)
[name] called to schedule appt, message sent to schedulers.

## 2020-06-13 NOTE — Telephone Encounter (Signed)
Scheduled appt per 10/07 sch msg - pt aware of apt date and time

## 2020-06-14 DIAGNOSIS — I4821 Permanent atrial fibrillation: Secondary | ICD-10-CM | POA: Diagnosis not present

## 2020-06-15 ENCOUNTER — Encounter: Payer: Self-pay | Admitting: *Deleted

## 2020-06-15 LAB — BASIC METABOLIC PANEL
BUN/Creatinine Ratio: 20 (ref 12–28)
BUN: 17 mg/dL (ref 8–27)
CO2: 26 mmol/L (ref 20–29)
Calcium: 9 mg/dL (ref 8.7–10.3)
Chloride: 99 mmol/L (ref 96–106)
Creatinine, Ser: 0.84 mg/dL (ref 0.57–1.00)
GFR calc Af Amer: 75 mL/min/{1.73_m2} (ref >59)
GFR calc non Af Amer: 65 mL/min/{1.73_m2} (ref >59)
Glucose: 92 mg/dL (ref 65–99)
Potassium: 4.4 mmol/L (ref 3.5–5.2)
Sodium: 140 mmol/L (ref 134–144)

## 2020-06-16 DIAGNOSIS — Z85828 Personal history of other malignant neoplasm of skin: Secondary | ICD-10-CM | POA: Diagnosis not present

## 2020-06-16 DIAGNOSIS — C44692 Other specified malignant neoplasm of skin of right upper limb, including shoulder: Secondary | ICD-10-CM | POA: Diagnosis not present

## 2020-06-16 DIAGNOSIS — L820 Inflamed seborrheic keratosis: Secondary | ICD-10-CM | POA: Diagnosis not present

## 2020-06-16 DIAGNOSIS — L821 Other seborrheic keratosis: Secondary | ICD-10-CM | POA: Diagnosis not present

## 2020-06-16 DIAGNOSIS — D485 Neoplasm of uncertain behavior of skin: Secondary | ICD-10-CM | POA: Diagnosis not present

## 2020-06-24 ENCOUNTER — Encounter: Payer: Self-pay | Admitting: Hematology

## 2020-06-24 ENCOUNTER — Inpatient Hospital Stay (HOSPITAL_BASED_OUTPATIENT_CLINIC_OR_DEPARTMENT_OTHER): Payer: Medicare Other | Admitting: Hematology

## 2020-06-24 ENCOUNTER — Ambulatory Visit: Payer: Medicare Other | Admitting: Hematology

## 2020-06-24 ENCOUNTER — Other Ambulatory Visit: Payer: Self-pay

## 2020-06-24 VITALS — BP 96/57 | HR 82 | Temp 97.7°F | Resp 18 | Ht 66.0 in | Wt 139.4 lb

## 2020-06-24 DIAGNOSIS — Z23 Encounter for immunization: Secondary | ICD-10-CM | POA: Diagnosis not present

## 2020-06-24 DIAGNOSIS — J449 Chronic obstructive pulmonary disease, unspecified: Secondary | ICD-10-CM | POA: Diagnosis not present

## 2020-06-24 DIAGNOSIS — C50412 Malignant neoplasm of upper-outer quadrant of left female breast: Secondary | ICD-10-CM | POA: Diagnosis not present

## 2020-06-24 DIAGNOSIS — Z86000 Personal history of in-situ neoplasm of breast: Secondary | ICD-10-CM | POA: Diagnosis not present

## 2020-06-24 DIAGNOSIS — C50812 Malignant neoplasm of overlapping sites of left female breast: Secondary | ICD-10-CM | POA: Diagnosis not present

## 2020-06-24 DIAGNOSIS — I10 Essential (primary) hypertension: Secondary | ICD-10-CM | POA: Diagnosis not present

## 2020-06-24 DIAGNOSIS — Z170 Estrogen receptor positive status [ER+]: Secondary | ICD-10-CM

## 2020-06-24 DIAGNOSIS — I4891 Unspecified atrial fibrillation: Secondary | ICD-10-CM | POA: Diagnosis not present

## 2020-06-24 MED ORDER — EXEMESTANE 25 MG PO TABS: 25.0000 mg | ORAL_TABLET | Freq: Every day | ORAL | 1 refills | Status: DC

## 2020-06-24 NOTE — Progress Notes (Signed)
[place]   [phone] [phone]   Clinic Follow up Note   Patient Care Team: Susan Lima, MD as PCP - General (Internal Medicine) [name], Susan Bors, MD as PCP - Cardiology (Cardiology) Susan Germany, RN as Oncology Nurse Navigator Susan Kaufmann, RN as Oncology Nurse Navigator Susan Merle, MD as Consulting Physician (Hematology) Susan Skates, MD as Consulting Physician (General Surgery) Susan Rudd, MD as Consulting Physician (Radiation Oncology) Susan Frock, MD as Consulting Physician (Anesthesiology) Susan Frock, MD as Consulting Physician (Urology) Susan Perla, MD as Consulting Physician (Cardiology) Susan Noel, MD as Consulting Physician (Pulmonary Disease)  Date of Service:  06/24/2020  CHIEF COMPLAINT: f/u of left breast cancer  SUMMARY OF ONCOLOGIC HISTORY: Oncology History Overview Note  Cancer Staging Malignant neoplasm of upper-outer quadrant of left breast in female, estrogen receptor positive ([place]) Staging form: Breast, AJCC 8th Edition - Clinical stage from 04/01/2019: Stage IA (cT1c, cN0, cM0, G2, ER+, PR+, HER2-) - Signed by Susan Merle, MD on 04/07/2019 - Pathologic stage from 05/04/2019: Stage Unknown (pT2, pNX, cM0, G2, ER+, PR+, HER2-) - Signed by Susan Merle, MD on 05/27/2019    Malignant neoplasm of upper-outer quadrant of left breast in female, estrogen receptor positive ([place])  03/30/2019 Mammogram   Left diagnostic mammogram and [place] 03/30/19  IMPRESSION The 1.6cm heterogenous focal area in the left breast at 1:00-2:00 4-6cm form the nipple, corresponding to the hard palpable area in the left breast is suspicious of malignancy, particularly lobular carcinoma.     04/01/2019 Cancer Staging   Staging form: Breast, AJCC 8th Edition - Clinical stage from 04/01/2019: Stage IA (cT1c, cN0, cM0, G2, ER+, PR+, HER2-) - Signed by Susan Merle, MD on 04/07/2019   04/01/2019 Initial Biopsy   Diagnosis  04/01/19 Breast, left, needle core biopsy, 2 o'clock, 5cmfn - INVASIVE MAMMARY CARCINOMA, SEE COMMENT. - MAMMARY CARCINOMA IN SITU.   04/01/2019 Receptors her2   The tumor cells are NEGATIVE for Her2 (1+). Estrogen Receptor: 100%, POSITIVE, STRONG STAINING INTENSITY Progesterone Receptor: 30%, POSITIVE, STRONG STAINING INTENSITY Proliferation Marker Ki67: 20%   04/03/2019 Initial Diagnosis   Malignant neoplasm of upper-outer quadrant of left breast in female, estrogen receptor positive ([facility])    Genetic Testing   Negative genetic testing. No pathogenic variants identified on the [facility] Breast Cancer STAT Panel + Common Hereditary Cancers Panel. The STAT Breast cancer panel offered by [facility] includes sequencing and rearrangement analysis for the following 9 genes:  ATM, BRCA1, BRCA2, CDH1, CHEK2, PALB2, PTEN, STK11 and TP53.  The Common Hereditary Cancers Panel offered by [facility] includes sequencing and/or deletion duplication testing of the following 47 genes: APC, ATM, AXIN2, BARD1, BMPR1A, BRCA1, BRCA2, BRIP1, CDH1, CDKN2A (p14ARF), CDKN2A (p16INK4a), CKD4, CHEK2, CTNNA1, DICER1, EPCAM (Deletion/duplication testing only), GREM1 (promoter region deletion/duplication testing only), KIT, MEN1, MLH1, MSH2, MSH3, MSH6, MUTYH, NBN, NF1, NHTL1, PALB2, PDGFRA, PMS2, POLD1, POLE, PTEN, RAD50, RAD51C, RAD51D,  SDHB, SDHC, SDHD, SMAD4, SMARCA4. STK11, TP53, TSC1, TSC2, and VHL.  The following genes were evaluated for sequence changes only: SDHA and HOXB13 c.251G>A variant only. The report date is 04/17/2019.    05/04/2019 Surgery    LEFT TOTAL MASTECTOMY by Dr. Dalbert Dudley 05/04/19   05/04/2019 Pathology Results   Diagnosis 05/04/19 1. Breast, excision - INVASIVE LOBULAR CARCINOMA, NOTTINGHAM GRADE 2 - SEE COMMENT 2. Breast, simple mastectomy, Left total - MULTIFOCAL, INVASIVE LOBULAR CARCINOMA, NOTTINGHAM GRADE 2 OF 3, 2.1 CM - MARGINS UNINVOLVED BY CARCINOMA (0.2 CM; POSTERIOR MARGIN) -  LYMPHOVASCULAR  SPACE INVASION PRESENT - PREVIOUS BIOPSY SITE CHANGES PRESENT - SEBORRHEIC KERATOSIS - SEE ONCOLOGY TABLE AND COMMENT BELOW   05/04/2019 Cancer Staging   Staging form: Breast, AJCC 8th Edition - Pathologic stage from 05/04/2019: Stage Unknown (pT2, pNX, cM0, G2, ER+, PR+, HER2-) - Signed by Susan Merle, MD on 05/27/2019   05/31/2020 Breast US   Left Breast US 05/31/20 Multiple, raised, hard, pink lesions in the skin of th left chest surrounding the mastectomy scar. There is one inferior and lateral to the scar which has a corresponding 1.1cm vascular mass in teh skin at [place]. Dermatology consult for skin biopsy of the lesion is recommended to exclude recurrent breast cancer to the skin.    06/27/2020 -  Anti-estrogen oral therapy   First-line Exemestane 54m once daily starting 06/27/20      CURRENT THERAPY:  First-line Exemestane 276monce daily starting 06/27/20  INTERVAL HISTORY:  [name] here for a follow up of left breast cancer. She presents to the clinic with her daughter who is also a cancer survivor. She notes after her recent nodularity at her left chest skin, she had punch biopsy about 1 week ago with Dermatologist DaDanella Sensingnd per pt it was cancer. She denies pain at her left chest wall. She denies current joint pain but has OA which is manageable. She uses Tylenol at night if needed. She lives with her husband who is undergoing treatment for his bone marrow disease. She has children who live nearby and further in state. She is willing to try AI now.     REVIEW OF SYSTEMS:   Constitutional: Denies fevers, chills or abnormal weight loss Eyes: Denies blurriness of vision Ears, nose, mouth, throat, and face: Denies mucositis or sore throat Respiratory: Denies cough, dyspnea or wheezes Cardiovascular: Denies palpitation, chest discomfort or lower extremity swelling Gastrointestinal:  Denies nausea, heartburn or change in bowel habits Skin: Denies abnormal skin  rashes MSK: (+) OA  Lymphatics: Denies new lymphadenopathy or easy bruising Neurological:Denies numbness, tingling or new weaknesses Behavioral/Psych: Mood is stable, no new changes  All other systems were reviewed with the patient and are negative.  MEDICAL HISTORY:  Past Medical History:  Diagnosis Date  . Arthritis   . COPD (chronic obstructive pulmonary disease) ([place]  . Dyspnea on exertion   . GERD (gastroesophageal reflux disease)   . Glaucoma of both eyes   . History of atrial fibrillation without current medication 2010   POST SURG 2010--  PER PT NO ISSUES SINCE  . History of breast cancer    DX DCIS IN 2004--  S/P RIGHT MASTECTOMY , NO CHEMORADIATION---  NO RECURRENCE  . History of CHF (congestive heart failure)    POST SURG 2010  . History of hypertension   . History of shingles    02/ 2015--  back of neck and left flank--  no residual pain  . ICH (intracerebral hemorrhage) ([facility]/12/2014  . Nephrolithiasis   . Recurrent bladder papillary carcinoma ([facility]irst dx 06/ 2014   s/p  turbt's  and instillation mitomycin c (chemo)//dx again in 2015-different type  . [facility]    SURGICAL HISTORY: Past Surgical History:  Procedure Laterality Date  . CATARACT EXTRACTION W/ INTRAOCULAR LENS  IMPLANT, BILATERAL    . CRANIECTOMY N/A 02/05/2015   Procedure: SUBOCCIPITAL CRANIECTOMY EVACUATION OF HEMATOMA;  Surgeon: NeConsuella LoseMD;  [facility];  Service: Neurosurgery;  Laterality: N/A;  . CYSTOSCOPY W/ RETROGRADES  Bilateral 08/11/2014   Procedure: CYSTOSCOPY WITH BILATERAL RETROGRADE PYELOGRAM AND MITOMYCIN INSTILLATION;  Surgeon: Susan Frock, MD;  Location: [facility];  Service: Urology;  Laterality: Bilateral;  . CYSTOSCOPY W/ URETERAL STENT PLACEMENT Bilateral 03/26/2013   Procedure: CYSTOSCOPY WITH BILATERAL RETROGRADE PYELOGRAM/URETERAL STENT PLACEMENT;  Surgeon: Susan Frock, MD;  Location: [facility];  Service:  Urology;  Laterality: Bilateral;  . CYSTOSCOPY W/ URETERAL STENT PLACEMENT Left 05/13/2013   Procedure: CYSTOSCOPY WITH RETROGRADE PYELOGRAM/URETERAL STENT PLACEMENT STENT EXCHANGE;  Surgeon: Susan Frock, MD;  Location: [facility];  Service: Urology;  Laterality: Left;  . PARTIAL MASTECTOMY WITH NEEDLE LOCALIZATION Right 01-14-2003   DCIS  . REMOVAL CYST LEFT HAND  2013  . TOTAL KNEE ARTHROPLASTY Right 03-22-2009  . TOTAL MASTECTOMY Right 02-03-2003   W/ SLN BX  AND  POST 03-01-2003 EVACUATION HEMATOMA  . TOTAL MASTECTOMY Left 05/04/2019   Procedure: LEFT TOTAL MASTECTOMY;  Surgeon: Susan Skates, MD;  Location: [facility];  Service: General;  Laterality: Left;  . TRANSTHORACIC ECHOCARDIOGRAM  03-25-2009   MILD LVF/ EF 70-80%/ MILD INCREASE SYSTOLIC PULMONARY  PRESSURE  . TRANSURETHRAL RESECTION OF BLADDER TUMOR WITH GYRUS (TURBT-GYRUS) N/A 03/26/2013   Procedure: TRANSURETHRAL RESECTION OF BLADDER TUMOR WITH GYRUS (TURBT-GYRUS);  Surgeon: Susan Frock, MD;  Location[facility];  Service: Urology;  Laterality: N/A;  . TRANSURETHRAL RESECTION OF BLADDER TUMOR WITH GYRUS (TURBT-GYRUS) N/A 05/13/2013   Procedure: TRANSURETHRAL RESECTION OF BLADDER TUMOR WITH GYRUS (TURBT-GYRUS)  RE-STAGING TRANSURETHRAL RESECTION OF BLADDER TUMOR, LEFT RETROGRADE PYELOGRAM AND STENT EXCHANGE;  Surgeon: Susan Frock, MD;  Location[facility];  Service: Urology;  Laterality: N/A;  . TRANSURETHRAL RESECTION OF BLADDER TUMOR WITH GYRUS (TURBT-GYRUS) N/A 08/11/2014   Procedure: TRANSURETHRAL RESECTION OF BLADDER TUMOR WITH GYRUS (TURBT-GYRUS);  Surgeon: Susan Frock, MD;  Location: [facility];  Service: Urology;  Laterality: N/A;  . TUBAL LIGATION      I have reviewed the social history and family history with the patient and they are unchanged from previous note.  ALLERGIES:  is allergic to heparin and morphine and related.  MEDICATIONS:  Current  Outpatient Medications  Medication Sig Dispense Refill  . albuterol (VENTOLIN HFA) 108 (90 Base) MCG/ACT inhaler Inhale 2 puffs into the lungs every 6 (six) hours as needed for wheezing or shortness of breath. (Patient not taking: Reported on 05/26/2020) 8 g 2  . arformoterol (BROVANA) 15 MCG/2ML NEBU Take 2 mLs (15 mcg total) by nebulization 2 (two) times daily. 120 mL 5  . atorvastatin (LIPITOR) 10 MG tablet TAKE ONE TABLET DAILY AT 6PM 90 tablet 3  . Brinzolamide-Brimonidine (SIMBRINZA) 1-0.2 % SUSP Place 1 drop into both eyes in the morning and at bedtime.    . budesonide (PULMICORT) 0.5 MG/2ML nebulizer solution Take 2 mLs (0.5 mg total) by nebulization in the morning and at bedtime. 120 mL 1  . digoxin (LANOXIN) 0.125 MG tablet TAKE ONE TABLET DAILY 90 tablet 2  . diltiazem (CARDIZEM CD) 120 MG 24 hr capsule Take 1 capsule (120 mg total) by mouth daily. 30 capsule 11  . exemestane (AROMASIN) 25 MG tablet Take 1 tablet (25 mg total) by mouth daily after breakfast. 30 tablet 1  . fluorometholone (FML) 0.1 % ophthalmic suspension Place 1 drop into both eyes daily.    . furosemide (LASIX) 40 MG tablet Take 40 mg by mouth in the morning and 20 mg in the evening    . guaiFENesin (Martin)  600 MG 12 hr tablet Take 600 mg by mouth daily.    Marland Kitchen ipratropium (ATROVENT) 0.02 % nebulizer solution Take 2.5 mLs (0.5 mg total) by nebulization 3 (three) times daily. DX: J44.9 225 mL 5  . latanoprost (XALATAN) 0.005 % ophthalmic solution Place 1 drop into both eyes at bedtime.    . OXYGEN Inhale 2 L into the lungs continuous.     . pantoprazole (PROTONIX) 40 MG tablet TAKE ONE TABLET DAILY 90 tablet 1  . Polyethylene Glycol 3350 (MIRALAX PO) Take 17 g by mouth daily.    . potassium chloride SA (KLOR-CON) 20 MEQ tablet Take 20 meq by mouth in the morning and 10 meq in the evening    . SHINGRIX injection Inject 0.5 mLs into the muscle once.     . valACYclovir (VALTREX) 1000 MG tablet Take 1,000 mg by mouth  daily.     No current facility-administered medications for this visit.    PHYSICAL EXAMINATION: ECOG PERFORMANCE STATUS: 3 - Symptomatic, >50% confined to bed  Vitals:   06/24/20 1442  BP: (!) 96/57  Pulse: 82  Resp: 18  Temp: 97.7 F (36.5 C)  SpO2: (!) 89%   Filed Weights   06/24/20 1442  Weight: 139 lb 6.4 oz (63.2 kg)    GENERAL:alert, no distress and comfortable SKIN: skin color, texture, turgor are normal, no rashes or significant lesions EYES: normal, Conjunctiva are pink and non-injected, sclera clear  NECK: supple, thyroid normal size, non-tender, without nodularity LYMPH:  no palpable lymphadenopathy in the cervical, axillary  LUNGS: clear to auscultation and percussion with normal breathing effort HEART: regular rate & rhythm and no murmurs and no lower extremity edema ABDOMEN:abdomen soft, non-tender and normal bowel sounds Musculoskeletal:no cyanosis of digits and no clubbing  NEURO: alert & oriented x 3 with fluent speech, no focal motor/sensory deficits BREAST: S/p bilateral mastectomy with surgical incision healed well. (+) Increased skin/soft tissue nodularity along mastectomy incision line and lower chest wall.       LABORATORY DATA:  I have reviewed the data as listed CBC Latest Ref Rng & Units 05/26/2020 12/14/2019 12/10/2019  WBC 4.0 - 10.5 K/uL 8.9 9.0 9.3  Hemoglobin 12.0 - 15.0 g/dL 14.4 13.2 13.5  Hematocrit 36 - 46 % 44.2 43.0 44.1  Platelets 150 - 400 K/uL 235 296 278     CMP Latest Ref Rng & Units 06/14/2020 05/26/2020 12/22/2019  Glucose 65 - 99 mg/dL 92 91 89  BUN 8 - 27 mg/dL _0 Creatinine 0.57 - 1.00 mg/dL 0.84 0.84 0.92  Sodium 134 - 144 mmol/L 140 139 142  Potassium 3.5 - 5.2 mmol/L 4.4 4.1 4.6  Chloride 96 - 106 mmol/L 99 102 100  CO2 20 - 29 mmol/L _1 Calcium 8.7 - 10.3 mg/dL 9.0 9.0 9.2  Total Protein 6.5 - 8.1 g/dL - 6.9 -  Total Bilirubin 0.3 - 1.2 mg/dL - 1.0 -  Alkaline Phos 38 - 126 U/L - 137(H) -  AST 15 -  41 U/L - 21 -  ALT 0 - 44 U/L - 15 -      RADIOGRAPHIC STUDIES: I have personally reviewed the radiological images as listed and agreed with the findings in the report. No results found.   ASSESSMENT & PLAN:  [name] is a 85 y.o. female with    1.Malignant neoplasm of upper-outer quadrant of left breast,lobular carcinoma, Stage 1A pT2cN0M0, ER/PR+, HER2-,Grade II, chest wall skin recurrence 05/2020 -  She was diagnosed in 03/2019. She underwent left totalmastectomyby Dr [name] on 05/04/19. -Genetictesting was negative for pathogenetic mutations -Oncotype was not recommend due to her advanced age andmedical comorbidities -Givenearly stage disease,post mastectomy radiation is not recommended.  -Patient declined antiestrogen therapy due to age and medical co-morbidities/ She is onsurveillance -Her 05/26/20 exam with NP Lacie showed nodularity along the left mastectomy site, possibly scar tissue, and faint skin eruption on the chest wall.  -She had skin punch biopsy done on 06/16/20 with Dermatologist [name].  I obtained the pathology report, which showed poorly differentiated adenocarcinoma, consistent with breast primary.  I have requested ER, PR and HER-2 to be done on the biopsy. -I recommend PET scan to further evaluate for distant metastasis. I discussed she may require [place] PET scan for ER+ disease. Unless unexpected findings, I may not do another biopsy. -I discussed surgery will likely not be offered given her skin involvement and diffuse nodules. I discussed without surgery her cancer is not curable but still treatable.  -If this is confirmed ER+/HER2- disease,  recommend Aromatase inhibitor such as Exemestane to control her disease.   -side effects which includes but not limited to, hot flash, skin and vaginal dryness, metabolic changes ( increased blood glucose, cholesterol, weight, etc.), slightly in increased risk of cardiovascular disease, cataracts,  muscular and joint discomfort, osteopenia and osteoporosis, etc, were discussed with her in great details. She is interested, plan to start when I have her ER/PR/HER2 results back -If she tolerates antiestrogen therapy, we can discuss adding CDK4/6 inhibitor such a Ibrance and Verzenio to increase disease control. Due to her advanced age and medical comorbidities, I do not plan to start along with AI at same time.  -I will f/u with her after PET scan.    2. H/o of right breast DCIS, Dx 2004  -S/p rightlumpectomyby Dr. Floyde Parkins 01/14/2003 then right mastectomy on 02/03/2003, no chemo/RT/AI  3. H/o of Recurrent bladder papillary carcinoma -Initiallydiagnosed on 03/26/13 with high grade papillary urothelial carcinoma -Diagnosedrecurrent low grade papillary urothelial carcinoma on 08/11/14 -underwent transurethral resection of bladder with Gyrus and stent placement.  -She continues to be followed by her Urologist Dr. Tresa Moore   5. H/o of ICH and stroke, S/p craniotomy in 02/2015  6. Comorbidities: HTN, Afib, COPD, Shingles and GERD and arthritis  -Managed by cardiologist Dr. Stanford Dudley, PCP and Pulmonologist Dr Elsworth Soho.  -Continue Medications and stay up to date on Shingles Vaccines.   -She has 40 year h/o smoking and quit 18 years ago -On 2L O2 canulasince 2019, On albuterol and inhalers. Stable.    PLAN: -I reviewed her recent skin punch biopsy pathology report, and called pathology lab to request ER, PR, and HER-2 to be done on the biopsy sample  -I called in Exemestane today, will start if her skin biopsy came back ER+ or PR+ and HER2-  -PET scan in 1-2 weeks.  -phone visit after PET  -Copy note to Dr [name] and Dr [name] and Dr Eartha Inch.     No problem-specific Assessment & Plan notes found for this encounter.   No orders of the defined types were placed in this encounter.  All questions were answered. The patient knows to call the clinic with any problems, questions or  concerns. No barriers to learning was detected. The total time spent in the appointment was 40 minutes.     Susan Merle, MD 06/24/2020   I, [name], am acting as scribe for Susan Merle, MD.  I have reviewed the above documentation for accuracy and completeness, and I agree with the above.       

## 2020-06-27 ENCOUNTER — Telehealth: Payer: Self-pay | Admitting: Hematology

## 2020-06-27 NOTE — Telephone Encounter (Signed)
Scheduled appointment per 10/22 los. Spoke to patient who is aware of appointment date and time.

## 2020-06-29 NOTE — Progress Notes (Signed)
Virtual Visit via Video Note   This visit type was conducted due to national recommendations for restrictions regarding the COVID-19 Pandemic (e.g. social distancing) in an effort to limit this patient's exposure and mitigate transmission in our community.  Due to her co-morbid illnesses, this patient is at least at moderate risk for complications without adequate follow up.  This format is felt to be most appropriate for this patient at this time.  All issues noted in this document were discussed and addressed.  A limited physical exam was performed with this format.  Please refer to the patient's chart for her consent to telehealth for [facility].      Date:  06/30/2020   ID:  [name], DOB 1935/03/03, MRN [id number]  Patient Location:Home Provider Location: Home  PCP:  Janith Lima, MD  Cardiologist:  Dr Stanford Breed  Evaluation Performed:  Follow-Up Visit  Chief Complaint:  FU atrial fibrillation  History of Present Illness:    FU atrial fibrillation and CHF. Patient was admitted in June 2016 with a large right cerebellar hemorrhage. Note her only anticoagulate at that time was aspirin. The hemorrhage required surgical evacuation. Patient developed atrial fibrillation during the hospitalization with difficult to control heart rate. She was felt not to be a candidate for anticoagulation given intracranial hemorrhage. Holter 7/16 showed atrial fibrillation with elevated rate; digoxin added. Ipreviously contacted Dr Kathyrn Sheriff Neurosurgery to see if she would be a candidate for short-term anticoagulation which would then qualify her for a watchman device. He felt this could be pursued.  However she declined watchman.  Patient admitted April 2021 with CHF.  Echocardiogram April 2021 showed vigorous LV systolic function, mild RV dysfunction, severe biatrial enlargement, moderate pulmonary hypertension. Since last seen,she notes some increased dyspnea with activities.  She denies  orthopnea, PND, chest pain or syncope.  No fevers or chills and no productive cough.  She has minimal pedal edema and her weight has not changed.  The patient does not have symptoms concerning for COVID-19 infection (fever, chills, cough, or new shortness of breath).    Past Medical History:  Diagnosis Date  . Arthritis   . COPD (chronic obstructive pulmonary disease) ([place])   . Dyspnea on exertion   . GERD (gastroesophageal reflux disease)   . Glaucoma of both eyes   . History of atrial fibrillation without current medication 2010   POST SURG 2010--  PER PT NO ISSUES SINCE  . History of breast cancer    DX DCIS IN 2004--  S/P RIGHT MASTECTOMY , NO CHEMORADIATION---  NO RECURRENCE  . History of CHF (congestive heart failure)    POST SURG 2010  . History of hypertension   . History of shingles    02/ 2015--  back of neck and left flank--  no residual pain  . ICH (intracerebral hemorrhage) ([place]) 02/05/2015  . Nephrolithiasis   . Recurrent bladder papillary carcinoma ([place]) first dx 06/ 2014   s/p  turbt's  and instillation mitomycin c (chemo)//dx again in 2015-different type  . Stroke [facility])    Past Surgical History:  Procedure Laterality Date  . CATARACT EXTRACTION W/ INTRAOCULAR LENS  IMPLANT, BILATERAL    . CRANIECTOMY N/A 02/05/2015   Procedure: SUBOCCIPITAL CRANIECTOMY EVACUATION OF HEMATOMA;  Surgeon: Consuella Lose, MD;  Location: [place] [facility];  Service: Neurosurgery;  Laterality: N/A;  . CYSTOSCOPY W/ RETROGRADES Bilateral 08/11/2014   Procedure: CYSTOSCOPY WITH BILATERAL RETROGRADE PYELOGRAM AND MITOMYCIN INSTILLATION;  Surgeon: Alexis Frock, MD;  Location:  [place];  Service: Urology;  Laterality: Bilateral;  . CYSTOSCOPY W/ URETERAL STENT PLACEMENT Bilateral 03/26/2013   Procedure: CYSTOSCOPY WITH BILATERAL RETROGRADE PYELOGRAM/URETERAL STENT PLACEMENT;  Surgeon: Alexis Frock, MD;  Location: [facility];  Service: Urology;  Laterality:  Bilateral;  . CYSTOSCOPY W/ URETERAL STENT PLACEMENT Left 05/13/2013   Procedure: CYSTOSCOPY WITH RETROGRADE PYELOGRAM/URETERAL STENT PLACEMENT STENT EXCHANGE;  Surgeon: Alexis Frock, MD;  Location: [facility];  Service: Urology;  Laterality: Left;  . PARTIAL MASTECTOMY WITH NEEDLE LOCALIZATION Right 01-14-2003   DCIS  . REMOVAL CYST LEFT HAND  2013  . TOTAL KNEE ARTHROPLASTY Right 03-22-2009  . TOTAL MASTECTOMY Right 02-03-2003   W/ SLN BX  AND  POST 03-01-2003 EVACUATION HEMATOMA  . TOTAL MASTECTOMY Left 05/04/2019   Procedure: LEFT TOTAL MASTECTOMY;  Surgeon: Fanny Skates, MD;  Location: [place];  Service: General;  Laterality: Left;  . TRANSTHORACIC ECHOCARDIOGRAM  03-25-2009   MILD LVF/ EF 70-80%/ MILD INCREASE SYSTOLIC PULMONARY  PRESSURE  . TRANSURETHRAL RESECTION OF BLADDER TUMOR WITH GYRUS (TURBT-GYRUS) N/A 03/26/2013   Procedure: TRANSURETHRAL RESECTION OF BLADDER TUMOR WITH GYRUS (TURBT-GYRUS);  Surgeon: Alexis Frock, MD;  Location: [facility];  Service: Urology;  Laterality: N/A;  . TRANSURETHRAL RESECTION OF BLADDER TUMOR WITH GYRUS (TURBT-GYRUS) N/A 05/13/2013   Procedure: TRANSURETHRAL RESECTION OF BLADDER TUMOR WITH GYRUS (TURBT-GYRUS)  RE-STAGING TRANSURETHRAL RESECTION OF BLADDER TUMOR, LEFT RETROGRADE PYELOGRAM AND STENT EXCHANGE;  Surgeon: Alexis Frock, MD;  Location: [facility];  Service: Urology;  Laterality: N/A;  . TRANSURETHRAL RESECTION OF BLADDER TUMOR WITH GYRUS (TURBT-GYRUS) N/A 08/11/2014   Procedure: TRANSURETHRAL RESECTION OF BLADDER TUMOR WITH GYRUS (TURBT-GYRUS);  Surgeon: Alexis Frock, MD;  Location: [facility];  Service: Urology;  Laterality: N/A;  . TUBAL LIGATION       Current Meds  Medication Sig  . albuterol (VENTOLIN HFA) 108 (90 Base) MCG/ACT inhaler Inhale 2 puffs into the lungs every 6 (six) hours as needed for wheezing or shortness of breath.  Marland Kitchen arformoterol (BROVANA) 15 MCG/2ML  NEBU Take 2 mLs (15 mcg total) by nebulization 2 (two) times daily.  Marland Kitchen atorvastatin (LIPITOR) 10 MG tablet TAKE ONE TABLET DAILY AT 6PM  . Brinzolamide-Brimonidine (SIMBRINZA) 1-0.2 % SUSP Place 1 drop into both eyes in the morning and at bedtime.  . budesonide (PULMICORT) 0.5 MG/2ML nebulizer solution Take 2 mLs (0.5 mg total) by nebulization in the morning and at bedtime.  . digoxin (LANOXIN) 0.125 MG tablet TAKE ONE TABLET DAILY  . diltiazem (CARDIZEM CD) 120 MG 24 hr capsule Take 1 capsule (120 mg total) by mouth daily.  Marland Kitchen exemestane (AROMASIN) 25 MG tablet Take 1 tablet (25 mg total) by mouth daily after breakfast.  . fluorometholone (FML) 0.1 % ophthalmic suspension Place 1 drop into both eyes daily.  . furosemide (LASIX) 40 MG tablet Take 40 mg by mouth in the morning and 20 mg in the evening  . guaiFENesin (MUCINEX) 600 MG 12 hr tablet Take 600 mg by mouth daily.  Marland Kitchen ipratropium (ATROVENT) 0.02 % nebulizer solution Take 2.5 mLs (0.5 mg total) by nebulization 3 (three) times daily. DX: J44.9  . latanoprost (XALATAN) 0.005 % ophthalmic solution Place 1 drop into both eyes at bedtime.  . OXYGEN Inhale 2 L into the lungs continuous.   . pantoprazole (PROTONIX) 40 MG tablet TAKE ONE TABLET DAILY  . Polyethylene Glycol 3350 (MIRALAX PO) Take 17 g by mouth daily.  . potassium chloride  SA (KLOR-CON) 20 MEQ tablet Take 20 meq by mouth in the morning and 10 meq in the evening  . valACYclovir (VALTREX) 1000 MG tablet Take 1,000 mg by mouth daily.     Allergies:   Heparin and Morphine and related   Social History   Tobacco Use  . Smoking status: Former Smoker    Packs/day: 1.00    Years: 40.00    Pack years: 40.00    Types: Cigarettes    Quit date: 03/20/2003    Years since quitting: 17.2  . Smokeless tobacco: Never Used  Vaping Use  . Vaping Use: Never used  Substance Use Topics  . Alcohol use: No    Alcohol/week: 0.0 standard drinks  . Drug use: No     Family Hx: The patient's  family history includes Breast cancer (age of onset: 55) in her maternal aunt; Breast cancer (age of onset: 44) in her daughter; Breast cancer (age of onset: 31) in an other family member; Cancer in her maternal aunt; Colon cancer in her maternal grandmother; Colon cancer (age of onset: 3) in her mother; Prostate cancer in her paternal grandfather; Stroke in her maternal grandmother and paternal grandmother. There is no history of Heart attack or Hypertension.  ROS:   Please see the history of present illness.    No Fever, chills  or productive cough All other systems reviewed and are negative.  Recent Labs: 12/10/2019: NT-Pro BNP 3,745 12/14/2019: B Natriuretic Peptide 738.5; TSH 1.490 12/15/2019: Magnesium 2.0 05/26/2020: ALT 15; Hemoglobin 14.4; Platelet Count 235 06/14/2020: BUN 17; Creatinine, Ser 0.84; Potassium 4.4; Sodium 140   Recent Lipid Panel Lab Results  Component Value Date/Time   CHOL 121 04/19/2017 09:36 AM   TRIG 114 04/19/2017 09:36 AM   HDL 46 04/19/2017 09:36 AM   CHOLHDL 2.6 04/19/2017 09:36 AM   CHOLHDL 3 05/09/2016 12:16 PM   LDLCALC 52 04/19/2017 09:36 AM    Wt Readings from Last 3 Encounters:  06/30/20 138 lb (62.6 kg)  06/24/20 139 lb 6.4 oz (63.2 kg)  05/26/20 138 lb 9.6 oz (62.9 kg)     Objective:    Vital Signs:  Ht _0  (1.676 m)   Wt 138 lb (62.6 kg)   LMP 12/03/2002   BMI 22.27 kg/m    VITAL SIGNS:  reviewed NAD Answers questions appropriately Normal affect Remainder of physical examination not performed (telehealth visit; coronavirus pandemic)  ASSESSMENT & PLAN:    1. Permanent atrial fibrillation-continue Cardizem and digoxin for rate control.  She is not a candidate for anticoagulation given prior intracranial hemorrhage on aspirin alone.  We previously offered short-term anticoagulation with placement of watchman but she declined.  She understands the higher risk of CVA. 2. chronic diastolic congestive heart failure-patient complains  of increased dyspnea with some improvement with nebulizers.  She denies increased pedal edema or increased weight.  Likely pulmonary in etiology.  However she does have a history of diastolic congestive heart failure and given baseline lung disease would have poor pulmonary reserve for any degree of CHF.  I will increase Lasix to 40 mg twice daily.  Check potassium and renal function in 1 week. 3. hypertension-blood pressure controlled.  Continue present medications and follow. 4. Hyperlipidemia-continue statin. 5. COPD-follow-up pulmonary.   COVID-19 Education: The importance of social distancing was discussed today.  Time:   Today, I have spent 17 minutes with the patient with telehealth technology discussing the above problems.     Medication Adjustments/Labs and Tests  Ordered: Current medicines are reviewed at length with the patient today.  Concerns regarding medicines are outlined above.   Tests Ordered: No orders of the defined types were placed in this encounter.   Medication Changes: No orders of the defined types were placed in this encounter.   Follow Up:  In Person in 6 month(s)  Signed, Kirk Ruths, MD  06/30/2020 8:17 AM    [place]

## 2020-06-30 ENCOUNTER — Encounter: Payer: Self-pay | Admitting: Cardiology

## 2020-06-30 ENCOUNTER — Telehealth (INDEPENDENT_AMBULATORY_CARE_PROVIDER_SITE_OTHER): Payer: Medicare Other | Admitting: Cardiology

## 2020-06-30 VITALS — Ht 66.0 in | Wt 138.0 lb

## 2020-06-30 DIAGNOSIS — I4821 Permanent atrial fibrillation: Secondary | ICD-10-CM

## 2020-06-30 DIAGNOSIS — I10 Essential (primary) hypertension: Secondary | ICD-10-CM

## 2020-06-30 DIAGNOSIS — I5032 Chronic diastolic (congestive) heart failure: Secondary | ICD-10-CM | POA: Diagnosis not present

## 2020-06-30 MED ORDER — FUROSEMIDE 40 MG PO TABS: 40.0000 mg | ORAL_TABLET | Freq: Two times a day (BID) | ORAL | 3 refills | Status: DC

## 2020-06-30 NOTE — Patient Instructions (Signed)
Medication Instructions:   INCREASE FUROSEMIDE TO 40 MG TWICE DAILY  *If you need a refill on your cardiac medications before your next appointment, please call your pharmacy*   Lab Work:  Your physician recommends that you return for lab work in: [place]  If you have labs (blood work) drawn today and your tests are completely normal, you will receive your results only by: Marland Kitchen MyChart Message (if you have MyChart) OR . A paper copy in the mail If you have any lab test that is abnormal or we need to change your treatment, we will call you to review the results.   Follow-Up: At [facility], you and your health needs are our priority.  As part of our continuing mission to provide you with exceptional heart care, we have created designated Provider Care Teams.  These Care Teams include your primary Cardiologist (physician) and Advanced Practice Providers (APPs -  Physician Assistants and Nurse Practitioners) who all work together to provide you with the care you need, when you need it.  We recommend signing up for the patient portal called "MyChart".  Sign up information is provided on this After Visit Summary.  MyChart is used to connect with patients for Virtual Visits (Telemedicine).  Patients are able to view lab/test results, encounter notes, upcoming appointments, etc.  Non-urgent messages can be sent to your provider as well.   To learn more about what you can do with MyChart, go to [email or web address].    Your next appointment:    AS SCHEDULED

## 2020-07-01 ENCOUNTER — Telehealth: Payer: Self-pay

## 2020-07-01 NOTE — Telephone Encounter (Signed)
I spoke with [name] and let her know her cancer is ER+ and Dr. Burr Medico recommends she start exemestane.  She states she has started the exemestane.  I reviewed her upcoming appts for PET scan, lab, and f/u here.  She verbalized understanding.

## 2020-07-04 DIAGNOSIS — Z23 Encounter for immunization: Secondary | ICD-10-CM | POA: Diagnosis not present

## 2020-07-08 ENCOUNTER — Ambulatory Visit (HOSPITAL_COMMUNITY)
Admission: RE | Admit: 2020-07-08 | Discharge: 2020-07-08 | Disposition: A | Discharge status: Home / Self Care | Payer: Medicare Other | Source: Ambulatory Visit | Attending: Hematology | Admitting: Hematology

## 2020-07-08 ENCOUNTER — Other Ambulatory Visit: Payer: Self-pay

## 2020-07-08 DIAGNOSIS — C50412 Malignant neoplasm of upper-outer quadrant of left female breast: Secondary | ICD-10-CM | POA: Insufficient documentation

## 2020-07-08 DIAGNOSIS — J9811 Atelectasis: Secondary | ICD-10-CM | POA: Insufficient documentation

## 2020-07-08 DIAGNOSIS — Z170 Estrogen receptor positive status [ER+]: Secondary | ICD-10-CM | POA: Insufficient documentation

## 2020-07-08 DIAGNOSIS — J90 Pleural effusion, not elsewhere classified: Secondary | ICD-10-CM | POA: Insufficient documentation

## 2020-07-08 DIAGNOSIS — Z8551 Personal history of malignant neoplasm of bladder: Secondary | ICD-10-CM | POA: Diagnosis not present

## 2020-07-08 DIAGNOSIS — C50912 Malignant neoplasm of unspecified site of left female breast: Secondary | ICD-10-CM | POA: Diagnosis not present

## 2020-07-08 LAB — GLUCOSE, CAPILLARY: Glucose-Capillary: 97 mg/dL (ref 70–99)

## 2020-07-08 MED ORDER — FLUDEOXYGLUCOSE F - 18 (FDG) INJECTION
7.3000 | Freq: Once | INTRAVENOUS | Status: AC | PRN
  Administered 2020-07-08: 7.3 via INTRAVENOUS

## 2020-07-11 DIAGNOSIS — I5032 Chronic diastolic (congestive) heart failure: Secondary | ICD-10-CM | POA: Diagnosis not present

## 2020-07-12 LAB — BASIC METABOLIC PANEL
BUN/Creatinine Ratio: 19 (ref 12–28)
BUN: 15 mg/dL (ref 8–27)
CO2: 27 mmol/L (ref 20–29)
Calcium: 9.1 mg/dL (ref 8.7–10.3)
Chloride: 104 mmol/L (ref 96–106)
Creatinine, Ser: 0.79 mg/dL (ref 0.57–1.00)
GFR calc Af Amer: 80 mL/min/{1.73_m2} (ref >59)
GFR calc non Af Amer: 69 mL/min/{1.73_m2} (ref >59)
Glucose: 121 mg/dL — ABNORMAL HIGH (ref 65–99)
Potassium: 4.4 mmol/L (ref 3.5–5.2)
Sodium: 145 mmol/L — ABNORMAL HIGH (ref 134–144)

## 2020-07-13 ENCOUNTER — Telehealth: Payer: Self-pay | Admitting: Hematology

## 2020-07-13 NOTE — Telephone Encounter (Signed)
R/s appt per 11/10 sch msg - pt is aware of appt date and time

## 2020-07-13 NOTE — Progress Notes (Signed)
[place]   Telephone:([phone]) [phone] Fax:(336) [phone]   Clinic Follow up Note   Patient Care Team: Janith Lima, MD as PCP - General (Internal Medicine) Stanford Breed Denice Bors, MD as PCP - Cardiology (Cardiology) Rockwell Germany, RN as Oncology Nurse Navigator Mauro Kaufmann, RN as Oncology Nurse Navigator Truitt Merle, MD as Consulting Physician (Hematology) Fanny Skates, MD as Consulting Physician (General Surgery) Kyung Rudd, MD as Consulting Physician (Radiation Oncology) Alexis Frock, MD as Consulting Physician (Anesthesiology) Alexis Frock, MD as Consulting Physician (Urology) Stanford Breed Denice Bors, MD as Consulting Physician (Cardiology) Rigoberto Noel, MD as Consulting Physician (Pulmonary Disease)   I connected with [name] on 07/14/2020 at 11:00 AM EST by telephone visit and verified that I am speaking with the correct person using two identifiers.  I discussed the limitations, risks, security and privacy concerns of performing an evaluation and management service by telephone and the availability of in person appointments. I also discussed with the patient that there may be a patient responsible charge related to this service. The patient expressed understanding and agreed to proceed.   Other persons participating in the visit and their role in the encounter:  [name] husband   Patient's location:  [place]  Provider's location:  Office   CHIEF COMPLAINT: f/u of left breast cancer  SUMMARY OF ONCOLOGIC HISTORY: Oncology History Overview Note  Cancer Staging Malignant neoplasm of upper-outer quadrant of left breast in female, estrogen receptor positive ([place]) Staging form: Breast, AJCC 8th Edition - Clinical stage from 04/01/2019: Stage IA (cT1c, cN0, cM0, G2, ER+, PR+, HER2-) - Signed by Truitt Merle, MD on 04/07/2019 - Pathologic stage from 05/04/2019: Stage Unknown (pT2, pNX, cM0, G2, ER+, PR+, HER2-) - Signed by Truitt Merle, MD on 05/27/2019      Malignant neoplasm of upper-outer quadrant of left breast in female, estrogen receptor positive ([place])  03/30/2019 Mammogram   Left diagnostic mammogram and [place] 03/30/19  IMPRESSION The 1.6cm heterogenous focal area in the left breast at 1:00-2:00 4-6cm form the nipple, corresponding to the hard palpable area in the left breast is suspicious of malignancy, particularly lobular carcinoma.     04/01/2019 Cancer Staging   Staging form: Breast, AJCC 8th Edition - Clinical stage from 04/01/2019: Stage IA (cT1c, cN0, cM0, G2, ER+, PR+, HER2-) - Signed by Truitt Merle, MD on 04/07/2019   04/01/2019 Initial Biopsy   Diagnosis 04/01/19 Breast, left, needle core biopsy, 2 o'clock, 5cmfn - INVASIVE MAMMARY CARCINOMA, SEE COMMENT. - MAMMARY CARCINOMA IN SITU.   04/01/2019 Receptors her2   The tumor cells are NEGATIVE for Her2 (1+). Estrogen Receptor: 100%, POSITIVE, STRONG STAINING INTENSITY Progesterone Receptor: 30%, POSITIVE, STRONG STAINING INTENSITY Proliferation Marker Ki67: 20%   04/03/2019 Initial Diagnosis   Malignant neoplasm of upper-outer quadrant of left breast in female, estrogen receptor positive ([facility])    Genetic Testing   Negative genetic testing. No pathogenic variants identified on the [facility] Breast Cancer STAT Panel + Common Hereditary Cancers Panel. The STAT Breast cancer panel offered by [facility] includes sequencing and rearrangement analysis for the following 9 genes:  ATM, BRCA1, BRCA2, CDH1, CHEK2, PALB2, PTEN, STK11 and TP53.  The Common Hereditary Cancers Panel offered by [facility] includes sequencing and/or deletion duplication testing of the following 47 genes: APC, ATM, AXIN2, BARD1, BMPR1A, BRCA1, BRCA2, BRIP1, CDH1, CDKN2A (p14ARF), CDKN2A (p16INK4a), CKD4, CHEK2, CTNNA1, DICER1, EPCAM (Deletion/duplication testing only), GREM1 (promoter region deletion/duplication testing only), KIT, MEN1, MLH1, MSH2, MSH3, MSH6, MUTYH, NBN, NF1, NHTL1,  PALB2, PDGFRA, PMS2, POLD1, POLE, PTEN, RAD50,  RAD51C, RAD51D,  SDHB, SDHC, SDHD, SMAD4, SMARCA4. STK11, TP53, TSC1, TSC2, and VHL.  The following genes were evaluated for sequence changes only: SDHA and HOXB13 c.251G>A variant only. The report date is 04/17/2019.    05/04/2019 Surgery    LEFT TOTAL MASTECTOMY by Dr. Dalbert Batman 05/04/19   05/04/2019 Pathology Results   Diagnosis 05/04/19 1. Breast, excision - INVASIVE LOBULAR CARCINOMA, NOTTINGHAM GRADE 2 - SEE COMMENT 2. Breast, simple mastectomy, Left total - MULTIFOCAL, INVASIVE LOBULAR CARCINOMA, NOTTINGHAM GRADE 2 OF 3, 2.1 CM - MARGINS UNINVOLVED BY CARCINOMA (0.2 CM; POSTERIOR MARGIN) - LYMPHOVASCULAR SPACE INVASION PRESENT - PREVIOUS BIOPSY SITE CHANGES PRESENT - SEBORRHEIC KERATOSIS - SEE ONCOLOGY TABLE AND COMMENT BELOW   05/04/2019 Cancer Staging   Staging form: Breast, AJCC 8th Edition - Pathologic stage from 05/04/2019: Stage Unknown (pT2, pNX, cM0, G2, ER+, PR+, HER2-) - Signed by Truitt Merle, MD on 05/27/2019   05/31/2020 Breast US   Left Breast US 05/31/20 Multiple, raised, hard, pink lesions in the skin of th left chest surrounding the mastectomy scar. There is one inferior and lateral to the scar which has a corresponding 1.1cm vascular mass in teh skin at [place]. Dermatology consult for skin biopsy of the lesion is recommended to exclude recurrent breast cancer to the skin.    06/27/2020 -  Anti-estrogen oral therapy   First-line Exemestane 78m once daily starting 06/27/20   07/08/2020 PET scan   IMPRESSION: 1. No metabolic activity associated with nodularity along the mastectomy scar in the LEFT chest wall. 2. Two hypermetabolic RIGHT supraclavicular lymph nodes. Hypermetabolic activity may relate to RIGHT arm COVID vaccination of 10/ 03/2020. Estradiol FDG PET scan may differentiate versus attention on follow-up versus biopsy. 3. No evidence distant metastatic disease. 4. Moderate RIGHT pleural effusion and basilar atelectasis.      CURRENT THERAPY:  First-line  Exemestane 245monce daily starting 06/27/20  INTERVAL HISTORY: [name] scheduled for a phone visit to discuss [name] recent PET scan findings.  She identified herself by date of birth and address.  She started Exemestane since I saw [name] 2 weeks ago, she developed slightly worsening left side back pain, it's intermittent, associated with position, no other join pain, no hot flush or other side effects.   She had COVID vaccine boost shot on 06/09/2020 and flu shot on 07/04/20 on right arm, before PET scan on 07/08/2020.   No other new symptoms.  [name] dyspnea and fatigue are stable.  All other systems were reviewed with the patient and are negative.  MEDICAL HISTORY:  Past Medical History:  Diagnosis Date  . Arthritis   . COPD (chronic obstructive pulmonary disease) ([place]  . Dyspnea on exertion   . GERD (gastroesophageal reflux disease)   . Glaucoma of both eyes   . History of atrial fibrillation without current medication 2010   POST SURG 2010--  PER PT NO ISSUES SINCE  . History of breast cancer    DX DCIS IN 2004--  S/P RIGHT MASTECTOMY , NO CHEMORADIATION---  NO RECURRENCE  . History of CHF (congestive heart failure)    POST SURG 2010  . History of hypertension   . History of shingles    02/ 2015--  back of neck and left flank--  no residual pain  . ICH (intracerebral hemorrhage) ([facility]/12/2014  . Nephrolithiasis   . Recurrent bladder papillary carcinoma ([facility] 06/ [place]   s/p  turbt's  and instillation mitomycin c (chemo)//dx again in 2015-different type  . Stroke ([facility])     SURGICAL HISTORY: Past Surgical History:  Procedure Laterality Date  . CATARACT EXTRACTION W/ INTRAOCULAR LENS  IMPLANT, BILATERAL    . CRANIECTOMY N/A 02/05/2015   Procedure: SUBOCCIPITAL CRANIECTOMY EVACUATION OF HEMATOMA;  Surgeon: Neelesh Nundkumar, MD;  Location: [facility];  Service: Neurosurgery;  Laterality: N/A;  . CYSTOSCOPY W/ RETROGRADES Bilateral 08/11/2014   Procedure:  CYSTOSCOPY WITH BILATERAL RETROGRADE PYELOGRAM AND MITOMYCIN INSTILLATION;  Surgeon: Theodore Manny, MD;  [facility];  Service: Urology;  Laterality: Bilateral;  . CYSTOSCOPY W/ URETERAL STENT PLACEMENT Bilateral 03/26/2013   Procedure: CYSTOSCOPY WITH BILATERAL RETROGRADE PYELOGRAM/URETERAL STENT PLACEMENT;  Surgeon: Theodore Manny, MD;  Location: [facility];  Service: Urology;  Laterality: Bilateral;  . CYSTOSCOPY W/ URETERAL STENT PLACEMENT Left 05/13/2013   Procedure: CYSTOSCOPY WITH RETROGRADE PYELOGRAM/URETERAL STENT PLACEMENT STENT EXCHANGE;  Surgeon: Theodore Manny, MD;  Location: [facility];  Service: Urology;  Laterality: Left;  . PARTIAL MASTECTOMY WITH NEEDLE LOCALIZATION Right 01-14-2003   DCIS  . REMOVAL CYST LEFT HAND  2013  . TOTAL KNEE ARTHROPLASTY Right 03-22-2009  . TOTAL MASTECTOMY Right 02-03-2003   W/ SLN BX  AND  POST 03-01-2003 EVACUATION HEMATOMA  . TOTAL MASTECTOMY Left 05/04/2019   Procedure: LEFT TOTAL MASTECTOMY;  Surgeon: Ingram, Haywood, MD;  Location: [facility];  Service: General;  Laterality: Left;  . TRANSTHORACIC ECHOCARDIOGRAM  03-25-2009   MILD LVF/ EF 70-80%/ MILD INCREASE SYSTOLIC PULMONARY  PRESSURE  . TRANSURETHRAL RESECTION OF BLADDER TUMOR WITH GYRUS (TURBT-GYRUS) N/A 03/26/2013   Procedure: TRANSURETHRAL RESECTION OF BLADDER TUMOR WITH GYRUS (TURBT-GYRUS);  Surgeon: Theodore Manny, MD;  Location: [facility];  Service: Urology;  Laterality: N/A;  . TRANSURETHRAL RESECTION OF BLADDER TUMOR WITH GYRUS (TURBT-GYRUS) N/A 05/13/2013   Procedure: TRANSURETHRAL RESECTION OF BLADDER TUMOR WITH GYRUS (TURBT-GYRUS)  RE-STAGING TRANSURETHRAL RESECTION OF BLADDER TUMOR, LEFT RETROGRADE PYELOGRAM AND STENT EXCHANGE;  Surgeon: Theodore Manny, MD;  Location: [facility];  Service: Urology;  Laterality: N/A;  . TRANSURETHRAL RESECTION OF BLADDER TUMOR WITH GYRUS (TURBT-GYRUS) N/A 08/11/2014    Procedure: TRANSURETHRAL RESECTION OF BLADDER TUMOR WITH GYRUS (TURBT-GYRUS);  Surgeon: Theodore Manny, MD;  Location: [facility];  Service: Urology;  Laterality: N/A;  . TUBAL LIGATION      I have reviewed the social history and family history with the patient and they are unchanged from previous note.  ALLERGIES:  is allergic to heparin and morphine and related.  MEDICATIONS:  Current Outpatient Medications  Medication Sig Dispense Refill  . albuterol (VENTOLIN HFA) 108 (90 Base) MCG/ACT inhaler Inhale 2 puffs into the lungs every 6 (six) hours as needed for wheezing or shortness of breath. 8 g 2  . arformoterol (BROVANA) 15 MCG/2ML NEBU Take 2 mLs (15 mcg total) by nebulization 2 (two) times daily. 120 mL 5  . atorvastatin (LIPITOR) 10 MG tablet TAKE ONE TABLET DAILY AT 6PM 90 tablet 3  . Brinzolamide-Brimonidine (SIMBRINZA) 1-0.2 % SUSP Place 1 drop into both eyes in the morning and at bedtime.    . budesonide (PULMICORT) 0.5 MG/2ML nebulizer solution Take 2 mLs (0.5 mg total) by nebulization in the morning and at bedtime. 120 mL 1  . digoxin (LANOXIN) 0.125 MG tablet TAKE ONE TABLET DAILY 90 tablet 2  . diltiazem (CARDIZEM CD) 120 MG 24 hr capsule Take 1 capsule (120 mg total) by mouth daily. 30 capsule   11  . exemestane (AROMASIN) 25 MG tablet Take 1 tablet (25 mg total) by mouth daily after breakfast. 30 tablet 1  . fluorometholone (FML) 0.1 % ophthalmic suspension Place 1 drop into both eyes daily.    . furosemide (LASIX) 40 MG tablet Take 1 tablet (40 mg total) by mouth 2 (two) times daily. 180 tablet 3  . guaiFENesin (MUCINEX) 600 MG 12 hr tablet Take 600 mg by mouth daily.    Marland Kitchen ipratropium (ATROVENT) 0.02 % nebulizer solution Take 2.5 mLs (0.5 mg total) by nebulization 3 (three) times daily. DX: J44.9 225 mL 5  . latanoprost (XALATAN) 0.005 % ophthalmic solution Place 1 drop into both eyes at bedtime.    . OXYGEN Inhale 2 L into the lungs continuous.     .  pantoprazole (PROTONIX) 40 MG tablet TAKE ONE TABLET DAILY 90 tablet 1  . Polyethylene Glycol 3350 (MIRALAX PO) Take 17 g by mouth daily.    . potassium chloride SA (KLOR-CON) 20 MEQ tablet Take 20 meq by mouth in the morning and 10 meq in the evening    . valACYclovir (VALTREX) 1000 MG tablet Take 1,000 mg by mouth daily.     No current facility-administered medications for this visit.    PHYSICAL EXAMINATION: ECOG PERFORMANCE STATUS: 3 - Symptomatic, >50% confined to bed  No vitals taken today, Exam not performed today   LABORATORY DATA:  I have reviewed the data as listed CBC Latest Ref Rng & Units 05/26/2020 12/14/2019 12/10/2019  WBC 4.0 - 10.5 K/uL 8.9 9.0 9.3  Hemoglobin 12.0 - 15.0 g/dL 14.4 13.2 13.5  Hematocrit 36 - 46 % 44.2 43.0 44.1  Platelets 150 - 400 K/uL 235 296 278     CMP Latest Ref Rng & Units 07/11/2020 06/14/2020 05/26/2020  Glucose 65 - 99 mg/dL 121(H) 92 91  BUN 8 - 27 mg/dL _0 Creatinine 0.57 - 1.00 mg/dL 0.79 0.84 0.84  Sodium 134 - 144 mmol/L 145(H) 140 139  Potassium 3.5 - 5.2 mmol/L 4.4 4.4 4.1  Chloride 96 - 106 mmol/L 104 99 102  CO2 20 - 29 mmol/L _1 Calcium 8.7 - 10.3 mg/dL 9.1 9.0 9.0  Total Protein 6.5 - 8.1 g/dL - - 6.9  Total Bilirubin 0.3 - 1.2 mg/dL - - 1.0  Alkaline Phos 38 - 126 U/L - - 137(H)  AST 15 - 41 U/L - - 21  ALT 0 - 44 U/L - - 15      RADIOGRAPHIC STUDIES: I have personally reviewed the radiological images as listed and agreed with the findings in the report. No results found.   ASSESSMENT & PLAN:  ANABETH CHILCOTT is a 85 y.o. female with    1.Malignant neoplasm of upper-outer quadrant of left breast,lobular carcinoma, Stage 1A pT2cN0M0, ER/PR+, HER2-,Grade II, chest wall skin recurrence 05/2020 ER+/PR+/HER2- -She was diagnosed in 03/2019. She underwent left totalmastectomyby Dr Dalbert Batman on 05/04/19. -Genetictesting was negative for pathogenetic mutations -Oncotype was not recommend due to [name] advanced  age andmedical comorbidities -Givenearly stage disease,post mastectomy radiation is not recommended.  -Patient declined antiestrogen therapy due to age and medical co-morbidities -She unfortunately developed local skin recurrence from breast cancer in September 2021.  Biopsy was done by [name] dermatologist.  -I personally reviewed and discussed [name] 07/08/20 PET which shows hypermetabolic right supraclavicular nodes, and negative uptake on the left chest wall with no skin recurrence, no other metastasis.  -I recommend [place] PET scan to see  if [name] right supraclavicular lymph node are ER positive, which would support metastasis from breast cancer, otherwise it is likely related to [name] recent vaccine injections. -Given [name] ER positive disease I started [name] on first line Exemestane 1m once daily starting 06/27/20. She is tolerating well -Due to [name] advanced age and medical comorbidities, I will hold on CK4/6 inhibitor for now -Follow-up in 3 to 4 weeks  2. H/o of right breast DCIS, Dx 2004  -S/p rightlumpectomyby Dr. SFloyde Parkins5/13/2004 then right mastectomy on 02/03/2003, no chemo/RT/AI  3. H/o of Recurrent bladder papillary carcinoma -Initiallydiagnosed on 03/26/13 with high grade papillary urothelial carcinoma -Diagnosedrecurrent low grade papillary urothelial carcinoma on 08/11/14 -underwent transurethral resection of bladder with Gyrus and stent placement.  -She continues to be followed by [name] Urologist Dr. MTresa Moore  5. H/o of ICH and stroke, S/p craniotomy in 02/2015  6. Comorbidities: HTN, Afib, COPD, Shingles and GERD and arthritis  -Managed by cardiologist Dr. CStanford Breed PCP and Pulmonologist Dr AElsworth Soho  -Continue Medications and stay up to date on Shingles Vaccines.   -She has 40 year h/o smoking and quit 18 years ago -On 2L O2 canulasince 2019, On albuterol and inhalers. Stable.    PLAN: -she is scheduled to see general surgeon -I reviewed [name] PET scan findings.  I  recommend [place] PET scan for further evaluation -Continue exemestane -Lab and follow-up in 3 to 4 weeks    No problem-specific Assessment & Plan notes found for this encounter.   Orders Placed This Encounter  Procedures  . [place] PET [place]    Standing Status:   Future    Standing Expiration Date:   07/14/2021    Order Specific Question:   If indicated for the ordered procedure, I authorize the administration of a radiopharmaceutical per Radiology protocol    Answer:   Yes    Order Specific Question:   [facility]?    [facility]  I discussed the assessment and treatment plan with the patient. The patient was provided an opportunity to ask questions and all were answered. The patient agreed with the plan and demonstrated an understanding of the instructions.  The patient was advised to call back or seek an in-person evaluation if the symptoms worsen or if the condition fails to improve as anticipated.  The total time spent in the appointment was 25 minutes.    YTruitt Merle MD 07/14/2020   I, AJoslyn Devon am acting as scribe for YTruitt Merle MD.   I have reviewed the above documentation for accuracy and completeness, and I agree with the above.

## 2020-07-14 ENCOUNTER — Encounter: Payer: Self-pay | Admitting: *Deleted

## 2020-07-14 ENCOUNTER — Encounter: Payer: Self-pay | Admitting: Hematology

## 2020-07-14 ENCOUNTER — Inpatient Hospital Stay: Payer: Medicare Other | Attending: Nurse Practitioner | Admitting: Hematology

## 2020-07-14 DIAGNOSIS — Z170 Estrogen receptor positive status [ER+]: Secondary | ICD-10-CM | POA: Diagnosis not present

## 2020-07-14 DIAGNOSIS — C50412 Malignant neoplasm of upper-outer quadrant of left female breast: Secondary | ICD-10-CM | POA: Diagnosis not present

## 2020-07-14 NOTE — Progress Notes (Signed)
HPI: FU atrial fibrillationand CHF. Patient was admitted in June 2016 with a large right cerebellar hemorrhage. Note her only anticoagulate at that time was aspirin. The hemorrhage required surgical evacuation. Patient developed atrial fibrillation during the hospitalization with difficult to control heart rate. She was felt not to be a candidate for anticoagulation given intracranial hemorrhage. Holter 7/16 showed atrial fibrillation with elevated rate; digoxin added. Ipreviously contacted Dr Kathyrn Sheriff Neurosurgery to see if she would be a candidate for short-term anticoagulation which would then qualify her for a watchman device. He felt this could be pursued.However she declined watchman. Patient admitted April 2021 with CHF. Echocardiogram April 2021 showed vigorous LV systolic function, mild RV dysfunction, severe biatrial enlargement, moderate pulmonary hypertension. Since last seen, she recently contacted the office with increased dyspnea.  I increased her Lasix to 40 mg twice daily but there has been no significant improvement.  She has dyspnea on exertion predominantly.  There is occasional orthopnea.  She denies pedal edema, chest pain or syncope.  Current Outpatient Medications  Medication Sig Dispense Refill   albuterol (VENTOLIN HFA) 108 (90 Base) MCG/ACT inhaler Inhale 2 puffs into the lungs every 6 (six) hours as needed for wheezing or shortness of breath. 8 g 2   arformoterol (BROVANA) 15 MCG/2ML NEBU Take 2 mLs (15 mcg total) by nebulization 2 (two) times daily. 120 mL 5   atorvastatin (LIPITOR) 10 MG tablet TAKE ONE TABLET DAILY AT 6PM 90 tablet 3   Brinzolamide-Brimonidine (SIMBRINZA) 1-0.2 % SUSP Place 1 drop into both eyes in the morning and at bedtime.     budesonide (PULMICORT) 0.5 MG/2ML nebulizer solution Take 2 mLs (0.5 mg total) by nebulization in the morning and at bedtime. 120 mL 1   digoxin (LANOXIN) 0.125 MG tablet TAKE ONE TABLET DAILY 90 tablet 2    diltiazem (CARDIZEM CD) 120 MG 24 hr capsule Take 1 capsule (120 mg total) by mouth daily. 30 capsule 11   exemestane (AROMASIN) 25 MG tablet Take 1 tablet (25 mg total) by mouth daily after breakfast. 30 tablet 1   fluorometholone (FML) 0.1 % ophthalmic suspension Place 1 drop into both eyes daily.     furosemide (LASIX) 40 MG tablet Take 1 tablet (40 mg total) by mouth 2 (two) times daily. 180 tablet 3   guaiFENesin (MUCINEX) 600 MG 12 hr tablet Take 600 mg by mouth daily.     ipratropium (ATROVENT) 0.02 % nebulizer solution Take 2.5 mLs (0.5 mg total) by nebulization 3 (three) times daily. DX: J44.9 225 mL 5   latanoprost (XALATAN) 0.005 % ophthalmic solution Place 1 drop into both eyes at bedtime.     OXYGEN Inhale 2 L into the lungs continuous.      pantoprazole (PROTONIX) 40 MG tablet TAKE ONE TABLET DAILY 90 tablet 1   Polyethylene Glycol 3350 (MIRALAX PO) Take 17 g by mouth daily.     potassium chloride SA (KLOR-CON) 20 MEQ tablet Take 20 meq by mouth in the morning and 10 meq in the evening     valACYclovir (VALTREX) 1000 MG tablet Take 1,000 mg by mouth daily.     No current facility-administered medications for this visit.     Past Medical History:  Diagnosis Date   Arthritis    COPD (chronic obstructive pulmonary disease) (HCC)    Dyspnea on exertion    GERD (gastroesophageal reflux disease)    Glaucoma of both eyes    History of atrial fibrillation without current  medication 2010   POST SURG 2010--  PER PT NO ISSUES SINCE   History of breast cancer    DX DCIS IN 2004--  S/P RIGHT MASTECTOMY , NO CHEMORADIATION---  NO RECURRENCE   History of CHF (congestive heart failure)    POST SURG 2010   History of hypertension    History of shingles    02/ 2015--  back of neck and left flank--  no residual pain   ICH (intracerebral hemorrhage) ([place]) 02/05/2015   Nephrolithiasis    Recurrent bladder papillary carcinoma ([place]) first dx 06/ 2014   s/p  turbt's   and instillation mitomycin c (chemo)//dx again in 2015-different type   Stroke [facility])     Past Surgical History:  Procedure Laterality Date   CATARACT EXTRACTION W/ INTRAOCULAR LENS  IMPLANT, BILATERAL     CRANIECTOMY N/A 02/05/2015   Procedure: SUBOCCIPITAL CRANIECTOMY EVACUATION OF HEMATOMA;  Surgeon: Consuella Lose, MD;  Location: [facility];  Service: Neurosurgery;  Laterality: N/A;   CYSTOSCOPY W/ RETROGRADES Bilateral 08/11/2014   Procedure: CYSTOSCOPY WITH BILATERAL RETROGRADE PYELOGRAM AND MITOMYCIN INSTILLATION;  Surgeon: Alexis Frock, MD;  Location: [facility];  Service: Urology;  Laterality: Bilateral;   CYSTOSCOPY W/ URETERAL STENT PLACEMENT Bilateral 03/26/2013   Procedure: CYSTOSCOPY WITH BILATERAL RETROGRADE PYELOGRAM/URETERAL STENT PLACEMENT;  Surgeon: Alexis Frock, MD;  Location: [facility];  Service: Urology;  Laterality: Bilateral;   CYSTOSCOPY W/ URETERAL STENT PLACEMENT Left 05/13/2013   Procedure: CYSTOSCOPY WITH RETROGRADE PYELOGRAM/URETERAL STENT PLACEMENT STENT EXCHANGE;  Surgeon: Alexis Frock, MD;  Location: [facility];  Service: Urology;  Laterality: Left;   PARTIAL MASTECTOMY WITH NEEDLE LOCALIZATION Right 01-14-2003   DCIS   REMOVAL CYST LEFT HAND  2013   TOTAL KNEE ARTHROPLASTY Right 03-22-2009   TOTAL MASTECTOMY Right 02-03-2003   W/ SLN BX  AND  POST 03-01-2003 EVACUATION HEMATOMA   TOTAL MASTECTOMY Left 05/04/2019   Procedure: LEFT TOTAL MASTECTOMY;  Surgeon: Fanny Skates, MD;  Location: [place];  Service: General;  Laterality: Left;   TRANSTHORACIC ECHOCARDIOGRAM  03-25-2009   MILD LVF/ EF 70-80%/ MILD INCREASE SYSTOLIC PULMONARY  PRESSURE   TRANSURETHRAL RESECTION OF BLADDER TUMOR WITH GYRUS (TURBT-GYRUS) N/A 03/26/2013   Procedure: TRANSURETHRAL RESECTION OF BLADDER TUMOR WITH GYRUS (TURBT-GYRUS);  Surgeon: Alexis Frock, MD;  Location: [facility];  Service: Urology;   Laterality: N/A;   TRANSURETHRAL RESECTION OF BLADDER TUMOR WITH GYRUS (TURBT-GYRUS) N/A 05/13/2013   Procedure: TRANSURETHRAL RESECTION OF BLADDER TUMOR WITH GYRUS (TURBT-GYRUS)  RE-STAGING TRANSURETHRAL RESECTION OF BLADDER TUMOR, LEFT RETROGRADE PYELOGRAM AND STENT EXCHANGE;  Surgeon: Alexis Frock, MD;  Location: [facility];  Service: Urology;  Laterality: N/A;   TRANSURETHRAL RESECTION OF BLADDER TUMOR WITH GYRUS (TURBT-GYRUS) N/A 08/11/2014   Procedure: TRANSURETHRAL RESECTION OF BLADDER TUMOR WITH GYRUS (TURBT-GYRUS);  Surgeon: Alexis Frock, MD;  Location: [facility];  Service: Urology;  Laterality: N/A;   TUBAL LIGATION      Social History   Socioeconomic History   Marital status: Married    Spouse name: Not on file   Number of children: 4   Years of education: Not on file   Highest education level: Not on file  Occupational History   Not on file  Tobacco Use   Smoking status: Former Smoker    Packs/day: 1.00    Years: 40.00    Pack years: 40.00    Types: Cigarettes    Quit date: 03/20/2003  Years since quitting: 17.3   Smokeless tobacco: Never Used  Vaping Use   Vaping Use: Never used  Substance and Sexual Activity   Alcohol use: No    Alcohol/week: 0.0 standard drinks   Drug use: No   Sexual activity: Not Currently    Partners: Male    Birth control/protection: Surgical    Comment: BTL  Other Topics Concern   Not on file  Social History Narrative   Not on file   Social Determinants of Health   Financial Resource Strain:    Difficulty of Paying Living Expenses: Not on file  Food Insecurity:    Worried About Charity fundraiser in the Last Year: Not on file   [employer] of Food in the Last Year: Not on file  Transportation Needs:    Lack of Transportation (Medical): Not on file   Lack of Transportation (Non-Medical): Not on file  Physical Activity:    Days of Exercise per Week: Not on file   Minutes of  Exercise per Session: Not on file  Stress:    Feeling of Stress : Not on file  Social Connections:    Frequency of Communication with Friends and Family: Not on file   Frequency of Social Gatherings with Friends and Family: Not on file   Attends Religious Services: Not on file   Active Member of Clubs or Organizations: Not on file   Attends Archivist Meetings: Not on file   Marital Status: Not on file  Intimate Partner Violence:    Fear of Current or Ex-Partner: Not on file   Emotionally Abused: Not on file   Physically Abused: Not on file   Sexually Abused: Not on file    Family History  Problem Relation Age of Onset   Colon cancer Mother 57   Colon cancer Maternal Grandmother    Stroke Maternal Grandmother    Breast cancer Maternal Aunt 63   Cancer Maternal Aunt        ? ovarian   Breast cancer Other 60       breast cancer, ovarian cancer   Breast cancer Daughter 46   Stroke Paternal Grandmother    Prostate cancer Paternal Grandfather    Heart attack Neg Hx    Hypertension Neg Hx     ROS: no fevers or chills, productive cough, hemoptysis, dysphasia, odynophagia, melena, hematochezia, dysuria, hematuria, rash, seizure activity, orthopnea, PND, pedal edema, claudication. Remaining systems are negative.  Physical Exam: Well-developed dyspneic in no acute distress.  Skin is warm and dry.  HEENT is normal.  Neck is supple.  Chest is clear to auscultation with normal expansion.  Cardiovascular exam is irregular Abdominal exam nontender or distended. No masses palpated. Extremities show no edema. neuro grossly intact  A/P  1 permanent atrial fibrillation-plan to continue Cardizem and digoxin.  As outlined previously she is not a candidate for long-term anticoagulation given prior intracranial hemorrhage on aspirin alone.  We previously discussed short-term anticoagulation with placement of a watchman device and she declined.  She  understands there is a higher risk of CVA off of anticoagulation.  2 chronic diastolic congestive heart failure-she recently contacted the office with worsening dyspnea.  I increased her Lasix but this has not improved her symptoms.  She does not appear to be volume overloaded on examination.  Her blood pressure is borderline.  Decrease Lasix to 40 mg in the morning and 20 mg in the afternoon.  Recent PET scan showed right pleural  effusion.  I will arrange a PA and lateral chest x-ray.  If right effusion is significant may need thoracentesis.  We will also arrange follow-up evaluation with pulmonary given significant underlying lung disease.  3 hypertension-patient's blood pressure is borderline; decrease Lasix as outlined above.  4 hyperlipidemia-continue statin.  5 COPD-will arrange follow-up appointment with pulmonary.  6 recurrent breast cancer-follow-up oncology.  Kirk Ruths, MD

## 2020-07-15 ENCOUNTER — Telehealth: Payer: Self-pay | Admitting: Hematology

## 2020-07-15 NOTE — Telephone Encounter (Signed)
Scheduled per 11/11 los. Pt requested late afternoon. Pt is aware of appt time and date

## 2020-07-18 ENCOUNTER — Ambulatory Visit (INDEPENDENT_AMBULATORY_CARE_PROVIDER_SITE_OTHER): Payer: Medicare Other | Admitting: Cardiology

## 2020-07-18 ENCOUNTER — Other Ambulatory Visit: Payer: Self-pay

## 2020-07-18 ENCOUNTER — Encounter: Payer: Self-pay | Admitting: Cardiology

## 2020-07-18 ENCOUNTER — Other Ambulatory Visit: Payer: Self-pay | Admitting: Adult Health

## 2020-07-18 ENCOUNTER — Ambulatory Visit
Admission: RE | Admit: 2020-07-18 | Discharge: 2020-07-18 | Disposition: A | Discharge status: Home / Self Care | Payer: Medicare Other | Source: Ambulatory Visit | Attending: Cardiology | Admitting: Cardiology

## 2020-07-18 VITALS — BP 90/55 | Ht 66.0 in | Wt 136.0 lb

## 2020-07-18 DIAGNOSIS — I10 Essential (primary) hypertension: Secondary | ICD-10-CM | POA: Diagnosis not present

## 2020-07-18 DIAGNOSIS — I5032 Chronic diastolic (congestive) heart failure: Secondary | ICD-10-CM | POA: Diagnosis not present

## 2020-07-18 DIAGNOSIS — R0602 Shortness of breath: Secondary | ICD-10-CM | POA: Diagnosis not present

## 2020-07-18 DIAGNOSIS — J90 Pleural effusion, not elsewhere classified: Secondary | ICD-10-CM | POA: Diagnosis not present

## 2020-07-18 DIAGNOSIS — I4821 Permanent atrial fibrillation: Secondary | ICD-10-CM | POA: Diagnosis not present

## 2020-07-18 MED ORDER — FUROSEMIDE 40 MG PO TABS: ORAL_TABLET | ORAL | 3 refills | Status: DC

## 2020-07-18 NOTE — Patient Instructions (Signed)
Medication Instructions:   DECREASE FUROSEMIDE TO 40 MG IN THE MORNING AND 20 MG IN THE AFTERNOON  *If you need a refill on your cardiac medications before your next appointment, please call your pharmacy*  Testing/Procedures:  A chest x-ray takes a picture of the organs and structures inside the chest, including the heart, lungs, and blood vessels. This test can show several things, including, whether the heart is enlarges; whether fluid is building up in the lungs; and whether pacemaker / defibrillator leads are still in place. [address]   Follow-Up: At [facility], you and your health needs are our priority.  As part of our continuing mission to provide you with exceptional heart care, we have created designated Provider Care Teams.  These Care Teams include your primary Cardiologist (physician) and Advanced Practice Providers (APPs -  Physician Assistants and Nurse Practitioners) who all work together to provide you with the care you need, when you need it.  We recommend signing up for the patient portal called "MyChart".  Sign up information is provided on this After Visit Summary.  MyChart is used to connect with patients for Virtual Visits (Telemedicine).  Patients are able to view lab/test results, encounter notes, upcoming appointments, etc.  Non-urgent messages can be sent to your provider as well.   To learn more about what you can do with MyChart, go to [email or web address].    Your next appointment:   2-4 week(s)  The format for your next appointment:   In Person  Provider:   Kirk Ruths, MD   Other Instructions FOLLOW UP WITH DR AVVA, PULMONARY ASAP FOR SOB

## 2020-07-19 ENCOUNTER — Encounter: Payer: Self-pay | Admitting: Pulmonary Disease

## 2020-07-19 ENCOUNTER — Telehealth: Payer: Self-pay | Admitting: Hematology

## 2020-07-19 ENCOUNTER — Encounter: Payer: Self-pay | Admitting: Hematology

## 2020-07-19 ENCOUNTER — Telehealth: Payer: Self-pay | Admitting: Pulmonary Disease

## 2020-07-19 ENCOUNTER — Ambulatory Visit (INDEPENDENT_AMBULATORY_CARE_PROVIDER_SITE_OTHER): Payer: Medicare Other | Admitting: Pulmonary Disease

## 2020-07-19 VITALS — BP 112/72 | HR 47 | Temp 97.3°F | Ht 66.0 in | Wt 138.6 lb

## 2020-07-19 DIAGNOSIS — J90 Pleural effusion, not elsewhere classified: Secondary | ICD-10-CM | POA: Diagnosis not present

## 2020-07-19 DIAGNOSIS — J9611 Chronic respiratory failure with hypoxia: Secondary | ICD-10-CM | POA: Diagnosis not present

## 2020-07-19 DIAGNOSIS — C50412 Malignant neoplasm of upper-outer quadrant of left female breast: Secondary | ICD-10-CM | POA: Diagnosis not present

## 2020-07-19 DIAGNOSIS — I5042 Chronic combined systolic (congestive) and diastolic (congestive) heart failure: Secondary | ICD-10-CM | POA: Diagnosis not present

## 2020-07-19 DIAGNOSIS — Z170 Estrogen receptor positive status [ER+]: Secondary | ICD-10-CM | POA: Diagnosis not present

## 2020-07-19 DIAGNOSIS — R0602 Shortness of breath: Secondary | ICD-10-CM | POA: Diagnosis not present

## 2020-07-19 DIAGNOSIS — I4821 Permanent atrial fibrillation: Secondary | ICD-10-CM | POA: Diagnosis not present

## 2020-07-19 DIAGNOSIS — Z0000 Encounter for general adult medical examination without abnormal findings: Secondary | ICD-10-CM

## 2020-07-19 DIAGNOSIS — J449 Chronic obstructive pulmonary disease, unspecified: Secondary | ICD-10-CM

## 2020-07-19 MED ORDER — ALBUTEROL SULFATE (2.5 MG/3ML) 0.083% IN NEBU: 2.5000 mg | INHALATION_SOLUTION | Freq: Four times a day (QID) | RESPIRATORY_TRACT | 11 refills | Status: DC | PRN

## 2020-07-19 MED ORDER — IPRATROPIUM BROMIDE 0.02 % IN SOLN: 0.5000 mg | Freq: Three times a day (TID) | RESPIRATORY_TRACT | 5 refills | Status: AC

## 2020-07-19 MED ORDER — ALBUTEROL SULFATE (2.5 MG/3ML) 0.083% IN NEBU: 2.5000 mg | INHALATION_SOLUTION | Freq: Four times a day (QID) | RESPIRATORY_TRACT | 12 refills | Status: DC | PRN

## 2020-07-19 NOTE — Telephone Encounter (Signed)
Called and spoke with the pharmacy, advised that they needed a new script with the diagnosis code on it.  Script for Albuterol neb resent with diagnosis code of J44.9.  Nothing further needed.

## 2020-07-19 NOTE — Progress Notes (Signed)
_0  ID: [name], female    DOB: 02-22-1935, 85 y.o.   MRN: [id number]  Chief Complaint  Patient presents with  . Follow-up    COPD, more SOB with exertion    Referring provider: Janith Lima, MD  HPI:  85 year old female former smoker found our office for COPD and chronic respiratory failure  PMH: History of bladder cancer, GERD, history of breast cancer, prediabetes, hyperlipidemia, A. fib, CHF Smoker/ Smoking History: Former smoker smoking history Maintenance: Pulmicort, Brovana Pt of: Dr. Elsworth Soho  07/19/2020  - Visit   85 year old female former smoker followed in our office for COPD.  She was last seen in our office in March/2021 by TP NP.  At that office visit it was recommended that she continue Pulmicort and Brovana nebulized meds twice daily, continue oxygen therapy, continue diuretics, follow-up with Dr. Elsworth Soho in 3 months  Patient presenting to office today due to acute worsening shortness of breath.  She was seen by cardiology on 07/18/2020.  She is established with Dr. Stanford Breed.  The assessment and plan from that visit is listed below:  A/P  1 permanent atrial fibrillation-plan to continue Cardizem and digoxin.  As outlined previously she is not a candidate for long-term anticoagulation given prior intracranial hemorrhage on aspirin alone.  We previously discussed short-term anticoagulation with placement of a watchman device and she declined.  She understands there is a higher risk of CVA off of anticoagulation.  2 chronic diastolic congestive heart failure-she recently contacted the office with worsening dyspnea.  I increased her Lasix but this has not improved her symptoms.  She does not appear to be volume overloaded on examination.  Her blood pressure is borderline.  Decrease Lasix to 40 mg in the morning and 20 mg in the afternoon.  Recent PET scan showed right pleural effusion.  I will arrange a PA and lateral chest x-ray.  If right effusion is  significant may need thoracentesis.  We will also arrange follow-up evaluation with pulmonary given significant underlying lung disease.  3 hypertension-patient's blood pressure is borderline; decrease Lasix as outlined above.  4 hyperlipidemia-continue statin.  5 COPD-will arrange follow-up appointment with pulmonary.  6 recurrent breast cancer-follow-up oncology.  Kirk Ruths, MD  Patient was also seen by Dr. Burr Medico with oncology recently.  Assessment and plan from that office visit is below:  1.Malignant neoplasm of upper-outer quadrant of left breast,lobularcarcinoma, Stage 1A pT2cN0M0, ER/PR+, HER2-,Grade II, chest wall skin recurrence 05/2020 ER+/PR+/HER2- -She was diagnosed in 03/2019. She underwent left totalmastectomyby Dr Dalbert Batman on 05/04/19. -Genetictesting was negative for pathogenetic mutations -Oncotype was not recommend due to her advanced age andmedical comorbidities -Givenearly stage disease,post mastectomy radiation is not recommended.  -Patient declined antiestrogen therapy due to age and medical co-morbidities -She unfortunately developed local skin recurrence from breast cancer in September 2021.  Biopsy was done by her dermatologist.  -I personally reviewed and discussed her 07/08/20 PET which shows hypermetabolic right supraclavicular nodes, and negative uptake on the left chest wall with no skin recurrence, no other metastasis.  -I recommend Cerianna PET scan to see if her right supraclavicular lymph node are ER positive, which would support metastasis from breast cancer, otherwise it is likely related to her recent vaccine injections. -Given her ER positive disease I started her on first line Exemestane 74m once daily starting 06/27/20. She is tolerating well -Due to her advanced age and medical comorbidities, I will hold on CK4/6 inhibitor for now -Follow-up in 3 to 4 weeks  2. H/o of right breast DCIS, Dx 2004  -S/p rightlumpectomyby Dr.  Floyde Parkins 01/14/2003 then right mastectomy on 02/03/2003, no chemo/RT/AI  3. H/o of Recurrent bladder papillary carcinoma -Initiallydiagnosed on 03/26/13 with high grade papillary urothelial carcinoma -Diagnosedrecurrent low grade papillary urothelial carcinoma on 08/11/14 -underwent transurethral resection of bladder with Gyrus and stent placement.  -She continues to be followed by her Urologist Dr. Tresa Moore   5. H/o of ICH and stroke, S/p craniotomy in 02/2015  6. Comorbidities: HTN, Afib, COPD, Shingles and GERD and arthritis  -Managed by cardiologist Dr. Stanford Breed, PCP and Pulmonologist Dr Elsworth Soho.  -Continue Medications and stay up to date on Shingles Vaccines.  -She has 40 year h/o smoking and quit 18 years ago -On 2L O2 canulasince 2019, On albuterol and inhalers. Stable.    PLAN: -she is scheduled to see general surgeon -I reviewed her PET scan findings.  I recommend Cerianna PET scan for further evaluation -Continue exemestane -Lab and follow-up in 3 to 4 weeks  Patient also completed a chest x-ray recently.  Those results listed below:  07/18/2020-chest x-ray-small right pleural effusion, mild right middle lobe atelectasis, no edema or airspace opacity, aortic arthrosclerosis  Patient is scheduled for a PET cerianna whole-body managed by oncology on 08/05/2020.  Patient reported that she has had increased dyspnea on exertion.  She is unsure if this is directly related to her new medication that was started by oncology Aromasin.  She feels that she may be having more backaches because of this.  She is also unsure if this is what may be worsening some of her shortness of breath.  She denies any acutely worsened wheezing, cough, congestion.  Denies lower extremity swelling.  She is a history of receiving a thoracentesis earlier this year when hospitalized from cardiology where 6 L of fluid was removed.  This was the first time she ever needed a thoracentesis.  She remains adherent  to her diuretics.  These were recently increased by cardiology and she reports that she is back down to 40 mg in the morning and 20 mg in the evening.   Questionaires / Pulmonary Flowsheets:   ACT:  No flowsheet data found.  MMRC: mMRC Dyspnea Scale mMRC Score  07/19/2020 3    Epworth:  No flowsheet data found.  Tests:   FENO:  No results found for: NITRICOXIDE  PFT: No flowsheet data found.  WALK:  SIX MIN WALK 10/16/2018 07/24/2018 04/11/2018  Supplimental Oxygen during Test? (L/min) Yes - -  O2 Flow Rate 2 - -  Type Pulse - -  2 Minute Oxygen Saturation % - 91 94  2 Minute HR - 95 78  4 Minute Oxygen Saturation % - 91 93  4 Minute HR - 102 89  6 Minute Oxygen Saturation % - 89 88  6 Minute HR - 100 67  Tech Comments: Patient was able to complete 2 laps. O2 dropped after the 2nd lap. She was backed on her O2 at 2L. O2 returned to 93%. Patient denied any symptoms of SOB chest pain or leg pain.  - -    Imaging: DG Chest 2 View  Result Date: 07/19/2020 CLINICAL DATA:  Shortness of breath EXAM: CHEST - 2 VIEW COMPARISON:  December 11, 2019 chest radiograph; PET-CT July 08, 2020 FINDINGS: There is a persistent right pleural effusion. There is mild right middle lobe atelectasis. There is no edema or airspace opacity. Heart is upper normal in size with pulmonary vascularity normal. No adenopathy. There is  aortic atherosclerosis. No bone lesions. IMPRESSION: Small right pleural effusion. Mild right middle lobe atelectasis. No edema or airspace opacity. Heart size within normal limits. Aortic Atherosclerosis (ICD10-I70.0). Electronically Signed   By: Lowella Grip III M.D.   On: 07/19/2020 13:51   NM PET Image Initial (PI) Skull Base To Thigh  Result Date: 07/08/2020 CLINICAL DATA:  Subsequent treatment strategy for LEFT breast carcinoma. bilateral mastectomy 2020. Additional history of bladder cancer. COVID vaccination booster RIGHT arm 06/09/2020. Nodularity along the LEFT  chest wall mastectomy scar was demonstrated poorly differentiated adenocarcinoma of breast origin on punch biopsy from dermatologist in September 2021. EXAM: NUCLEAR MEDICINE PET SKULL BASE TO THIGH TECHNIQUE: 7.1 mCi F-18 FDG was injected intravenously. Full-ring PET imaging was performed from the skull base to thigh after the radiotracer. CT data was obtained and used for attenuation correction and anatomic localization. Fasting blood glucose: 97 mg/dl COMPARISON:  None. FINDINGS: Mediastinal blood pool activity: SUV max 2.1 Liver activity: SUV max NA NECK: No hypermetabolic lymph nodes in the neck. Incidental CT findings: Craniotomy defect and encephalomalacia in the RIGHT posterior fossa CHEST: No discrete metabolic activity associated with the nodular scar along the LEFT chest wall. Nodular thickening is found on image 68 through 66 of the CT data set. No hypermetabolic LEFT axillary nodes. There is a hypermetabolic RIGHT supraclavicular node with SUV max equal 4.9 on image 42. A slightly more superior supraclavicular node SUV max equal 4.5 on image 37. These nodes measure 6 mm and 7 mm. No RIGHT axillary hypermetabolic lymph nodes. No hypermetabolic mediastinal lymph nodes. No suspicious pulmonary nodules. Centrilobular emphysema the upper lobes. Moderate layering RIGHT effusion. Passive atelectasis in the RIGHT lower lobe. Incidental CT findings: none ABDOMEN/PELVIS: No abnormal hypermetabolic activity within the liver, pancreas, adrenal glands, or spleen. No hypermetabolic lymph nodes in the abdomen or pelvis. Incidental CT findings: No uterus or adnexal abnormality. Atherosclerotic calcification of the aorta. SKELETON: No aggressive osseous lesion Incidental CT findings: none IMPRESSION: 1. No metabolic activity associated with nodularity along the mastectomy scar in the LEFT chest wall. 2. Two hypermetabolic RIGHT supraclavicular lymph nodes. Hypermetabolic activity may relate to RIGHT arm COVID  vaccination of 10/ 03/2020. Estradiol FDG PET scan may differentiate versus attention on follow-up versus biopsy. 3. No evidence distant metastatic disease. 4. Moderate RIGHT pleural effusion and basilar atelectasis. Electronically Signed   By: Suzy Bouchard M.D.   On: 07/08/2020 17:21    Lab Results:  CBC    Component Value Date/Time   WBC 8.9 05/26/2020 1431   WBC 9.0 12/14/2019 1940   RBC 4.41 05/26/2020 1431   HGB 14.4 05/26/2020 1431   HGB 13.5 12/10/2019 1620   HCT 44.2 05/26/2020 1431   HCT 44.1 12/10/2019 1620   PLT 235 05/26/2020 1431   PLT 278 12/10/2019 1620   MCV 100.2 (H) 05/26/2020 1431   MCV 90 12/10/2019 1620   MCH 32.7 05/26/2020 1431   MCHC 32.6 05/26/2020 1431   RDW 13.7 05/26/2020 1431   RDW 14.7 12/10/2019 1620   LYMPHSABS 1.7 05/26/2020 1431   MONOABS 0.9 05/26/2020 1431   EOSABS 0.1 05/26/2020 1431   BASOSABS 0.1 05/26/2020 1431    BMET    Component Value Date/Time   NA 145 (H) 07/11/2020 1445   K 4.4 07/11/2020 1445   CL 104 07/11/2020 1445   CO2 27 07/11/2020 1445   GLUCOSE 121 (H) 07/11/2020 1445   GLUCOSE 91 05/26/2020 1431   BUN 15 07/11/2020 1445  CREATININE 0.79 07/11/2020 1445   CREATININE 0.84 05/26/2020 1431   CREATININE 0.69 07/11/2015 0910   CALCIUM 9.1 07/11/2020 1445   GFRNONAA 69 07/11/2020 1445   GFRNONAA >60 05/26/2020 1431   GFRAA 80 07/11/2020 1445   GFRAA >60 05/26/2020 1431    BNP    Component Value Date/Time   BNP 738.5 (H) 12/14/2019 1940    ProBNP    Component Value Date/Time   PROBNP 3,745 (H) 12/10/2019 1620   PROBNP 571.0 (H) 07/20/2019 1532    Specialty Problems      Pulmonary Problems   COPD (chronic obstructive pulmonary disease) (HCC)   Chronic respiratory failure with hypoxia (HCC)   Pleural effusion   Shortness of breath      Allergies  Allergen Reactions  . Heparin     Previous history of hemorrhagic stroke.  Marland Kitchen Morphine And Related Nausea And Vomiting    Immunization History    Administered Date(s) Administered  . Fluad Quad(high Dose 65+) 06/09/2019  . Influenza, High Dose Seasonal PF 06/08/2016, 05/13/2017, 06/10/2018  . Influenza-Unspecified 07/05/2015  . PFIZER SARS-COV-2 Vaccination 10/30/2019, 11/25/2019, 06/09/2020  . Pneumococcal Conjugate-13 05/20/2014  . Pneumococcal Polysaccharide-23 10/09/2011, 06/17/2017  . Tdap 09/03/2005, 11/09/2015  . Zoster 09/04/2007  . Zoster Recombinat (Shingrix) 04/20/2019    Past Medical History:  Diagnosis Date  . Arthritis   . COPD (chronic obstructive pulmonary disease) ([place])   . Dyspnea on exertion   . GERD (gastroesophageal reflux disease)   . Glaucoma of both eyes   . History of atrial fibrillation without current medication 2010   POST SURG 2010--  PER PT NO ISSUES SINCE  . History of breast cancer    DX DCIS IN 2004--  S/P RIGHT MASTECTOMY , NO CHEMORADIATION---  NO RECURRENCE  . History of CHF (congestive heart failure)    POST SURG 2010  . History of hypertension   . History of shingles    02/ 2015--  back of neck and left flank--  no residual pain  . ICH (intracerebral hemorrhage) ([place]) 02/05/2015  . Nephrolithiasis   . Recurrent bladder papillary carcinoma ([place]) first dx 06/ 2014   s/p  turbt's  and instillation mitomycin c (chemo)//dx again in 2015-different type  . Stroke [facility])     Tobacco History: Social History   Tobacco Use  Smoking Status Former Smoker  . Packs/day: 1.00  . Years: 40.00  . Pack years: 40.00  . Types: Cigarettes  . Quit date: 03/20/2003  . Years since quitting: 17.3  Smokeless Tobacco Never Used   Counseling given: Not Answered   Continue to not smoke  Outpatient Encounter Medications as of 07/19/2020  Medication Sig  . albuterol (VENTOLIN HFA) 108 (90 Base) MCG/ACT inhaler Inhale 2 puffs into the lungs every 6 (six) hours as needed for wheezing or shortness of breath.  Marland Kitchen arformoterol (BROVANA) 15 MCG/2ML NEBU Take 2 mLs (15 mcg total) by nebulization 2 (two)  times daily.  Marland Kitchen atorvastatin (LIPITOR) 10 MG tablet TAKE ONE TABLET DAILY AT 6PM  . Brinzolamide-Brimonidine (SIMBRINZA) 1-0.2 % SUSP Place 1 drop into both eyes in the morning and at bedtime.  . budesonide (PULMICORT) 0.5 MG/2ML nebulizer solution Take 2 mLs (0.5 mg total) by nebulization in the morning and at bedtime.  . digoxin (LANOXIN) 0.125 MG tablet TAKE ONE TABLET DAILY  . diltiazem (CARDIZEM CD) 120 MG 24 hr capsule Take 1 capsule (120 mg total) by mouth daily.  Marland Kitchen exemestane (AROMASIN) 25 MG tablet Take 1  tablet (25 mg total) by mouth daily after breakfast.  . fluorometholone (FML) 0.1 % ophthalmic suspension Place 1 drop into both eyes daily.  . furosemide (LASIX) 40 MG tablet Take 1 tablet in the am and 1/2 tablet in the pm  . guaiFENesin (MUCINEX) 600 MG 12 hr tablet Take 600 mg by mouth daily.  Marland Kitchen ipratropium (ATROVENT) 0.02 % nebulizer solution Take 2.5 mLs (0.5 mg total) by nebulization 3 (three) times daily. DX: J44.9  . latanoprost (XALATAN) 0.005 % ophthalmic solution Place 1 drop into both eyes at bedtime.  . OXYGEN Inhale 2 L into the lungs continuous.   . pantoprazole (PROTONIX) 40 MG tablet TAKE ONE TABLET DAILY  . Polyethylene Glycol 3350 (MIRALAX PO) Take 17 g by mouth daily.  . potassium chloride SA (KLOR-CON) 20 MEQ tablet Take 20 meq by mouth in the morning and 10 meq in the evening  . valACYclovir (VALTREX) 1000 MG tablet Take 1,000 mg by mouth daily.  Marland Kitchen albuterol (PROVENTIL) (2.5 MG/3ML) 0.083% nebulizer solution Take 3 mLs (2.5 mg total) by nebulization every 6 (six) hours as needed for wheezing or shortness of breath.  . [DISCONTINUED] ipratropium (ATROVENT) 0.02 % nebulizer solution Take 2.5 mLs (0.5 mg total) by nebulization 3 (three) times daily. DX: J44.9   No facility-administered encounter medications on file as of 07/19/2020.     Review of Systems  Review of Systems  Constitutional: Positive for fatigue. Negative for activity change and fever.  HENT:  Negative for sinus pressure, sinus pain and sore throat.   Respiratory: Positive for shortness of breath. Negative for cough and wheezing.        Denies orthopnea  Cardiovascular: Negative for chest pain and palpitations.  Musculoskeletal: Negative for arthralgias.  Neurological: Negative for dizziness.  Psychiatric/Behavioral: Negative for sleep disturbance. The patient is not nervous/anxious.      Physical Exam  BP 112/72 (BP Location: Left Arm, Cuff Size: Normal)   Pulse (!) 47   Temp (!) 97.3 F (36.3 C) (Oral)   Ht 5' 6" (1.676 m)   Wt 138 lb 9.6 oz (62.9 kg)   LMP 12/03/2002   SpO2 93%   BMI 22.37 kg/m   Wt Readings from Last 5 Encounters:  07/19/20 138 lb 9.6 oz (62.9 kg)  07/18/20 136 lb (61.7 kg)  06/30/20 138 lb (62.6 kg)  06/24/20 139 lb 6.4 oz (63.2 kg)  05/26/20 138 lb 9.6 oz (62.9 kg)    BMI Readings from Last 5 Encounters:  07/19/20 22.37 kg/m  07/18/20 21.95 kg/m  06/30/20 22.27 kg/m  06/24/20 22.50 kg/m  05/26/20 22.37 kg/m     Physical Exam Vitals and nursing note reviewed.  Constitutional:      General: She is not in acute distress.    Appearance: Normal appearance. She is normal weight.  HENT:     Head: Normocephalic and atraumatic.     Right Ear: External ear normal.     Left Ear: External ear normal.     Nose: Nose normal. No congestion.     Mouth/Throat:     Mouth: Mucous membranes are moist.     Pharynx: Oropharynx is clear.  Eyes:     Pupils: Pupils are equal, round, and reactive to light.  Cardiovascular:     Rate and Rhythm: Normal rate. Rhythm regularly irregular.     Pulses: Normal pulses.     Heart sounds: Normal heart sounds. No murmur heard.   Pulmonary:     Breath sounds: No  decreased air movement. Examination of the right-lower field reveals decreased breath sounds. Decreased breath sounds present. No wheezing or rales.  Musculoskeletal:     Cervical back: Normal range of motion.  Skin:    General: Skin is warm and  dry.     Capillary Refill: Capillary refill takes less than 2 seconds.  Neurological:     General: No focal deficit present.     Mental Status: She is alert and oriented to person, place, and time. Mental status is at baseline.     Gait: Gait normal.  Psychiatric:        Mood and Affect: Mood normal.        Behavior: Behavior normal.        Thought Content: Thought content normal.        Judgment: Judgment normal.       Assessment & Plan:   Chronic combined systolic and diastolic congestive heart failure (HCC) Plan: Continue follow-up with cardiology Continue diuretics We will continue to clinically monitor the small right pleural effusion Continue to follow low-sodium diet Elevate legs when able  Permanent atrial fibrillation ([place]) Plan: Continue follow-up with cardiology  Chronic respiratory failure with hypoxia ([place]) Plan: Continue oxygen therapy as prescribed  COPD (chronic obstructive pulmonary disease) ([place]) Plan: Start Yupelri Continue Pulmicort nebs Continue Brovana nebs We will order pulmonary function testing Continue oxygen therapy   Pleural effusion History of previous pleural effusion that required thoracentesis by cardiology, patient reporting over 6 L was removed Patient with small right pleural effusion on chest x-ray from 07/18/2020  Plan: We will continue to clinically monitor small pleural effusion Continue diuretics We will repeat chest x-ray at next follow-up in 6 weeks Reviewed with patient and family member to notify our office if patient has acute worsening shortness of breath, orthopnea or weight increases or lower extremity swelling  Healthcare maintenance Up-to-date with vaccinations  Malignant neoplasm of upper-outer quadrant of left breast in female, estrogen receptor positive ([place]) Plan: Continue follow-up with oncology Please notify oncology of your concerns about your Aromasin  Shortness of breath Believe shortness of  breath is likely multifactorial given known persistent A. fib, severe COPD based off of spirometry in 2019, chronic respiratory failure, Physical deconditioning, active oncology managed by Dr. Burr Medico, recent Aromasin start (dyspnea is listed as a potential adverse reaction and 10% of patients), CHF, small right pleural effusion  Plan: Follow-up with oncology regarding Aromasin Obtain pulmonary function testing Continue oxygen therapy Continue Pulmicort nebs Continue Brovana nebs Continue Yupelri nebs    Return in about 6 weeks (around 08/30/2020), or if symptoms worsen or fail to improve, for Follow up with Dr. Elsworth Soho, Follow up for FULL PFT - 60 min.   Susan Rinne, NP 07/19/2020   This appointment required 42 minutes of patient care (this includes precharting, chart review, review of results, face-to-face care, etc.).

## 2020-07-19 NOTE — Assessment & Plan Note (Addendum)
Plan: Start Yupelri Continue Pulmicort nebs Continue Brovana nebs We will order pulmonary function testing Continue oxygen therapy

## 2020-07-19 NOTE — Patient Instructions (Addendum)
You were seen today by Lauraine Rinne, NP  for:   1. Shortness of breath  - Pulmonary function test; Future - DG Chest 2 View; Future  2. Chronic respiratory failure with hypoxia (HCC)  Continue oxygen therapy as prescribed  >>>maintain oxygen saturations greater than 88 percent  >>>if unable to maintain oxygen saturations please contact the office  >>>do not smoke with oxygen  >>>can use nasal saline gel or nasal saline rinses to moisturize nose if oxygen causes dryness  3. Chronic obstructive pulmonary disease, unspecified COPD type ([place])  - Pulmonary function test; Future - DG Chest 2 View; Future  Continue Brovana nebulized medication taken twice daily Continue Pulmicort nebulized medication taken twice daily  Start Yupelri nebulized medication taken daily >>> Samples provided today  Rinse mouth out after using nebulized medications  We will send in albuterol nebulized medications for you to be able to utilize if short of breath and wheezing at home  Only use your albuterol as a rescue medication to be used if you can't catch your breath by resting or doing a relaxed purse lip breathing pattern.  - The less you use it, the better it will work when you need it. - Ok to use up to 2 puffs  every 4 hours if you must but call for immediate appointment if use goes up over your usual need - Don't leave home without it !!  (think of it like the spare tire for your car)   4. Pleural effusion  - DG Chest 2 View; Future  Continue diuretics  We will repeat a chest x-ray in next follow-up in 6 weeks  5. Chronic combined systolic and diastolic congestive heart failure ([place]) 6. Permanent atrial fibrillation ([place])  Continue follow-up with cardiology  Continue diuretics as outlined  7. Malignant neoplasm of upper-outer quadrant of left breast in female, estrogen receptor positive ([place])  Keep upcoming follow-up with oncology  8. Healthcare maintenance  Great job being  up-to-date on your COVID-19 vaccinations   We recommend today:  Orders Placed This Encounter  Procedures  . DG Chest 2 View    Standing Status:   Future    Standing Expiration Date:   11/16/2020    Order Specific Question:   Reason for Exam (SYMPTOM  OR DIAGNOSIS REQUIRED)    Answer:   pleural effusion f/u    Order Specific Question:   Preferred imaging location?    Answer:   Internal    Order Specific Question:   Radiology Contrast Protocol - do NOT remove file path    Answer:   [email or web address]  . Pulmonary function test    Standing Status:   Future    Standing Expiration Date:   07/19/2021    Order Specific Question:   Where should this test be performed?    Answer:   [facility]    Order Specific Question:   Full PFT: includes the following: basic spirometry, spirometry pre & post bronchodilator, diffusion capacity (DLCO), lung volumes    Answer:   Full PFT   Orders Placed This Encounter  Procedures  . DG Chest 2 View  . Pulmonary function test   Meds ordered this encounter  Medications  . ipratropium (ATROVENT) 0.02 % nebulizer solution    Sig: Take 2.5 mLs (0.5 mg total) by nebulization 3 (three) times daily. DX: J44.9    Dispense:  225 mL    Refill:  5    Follow Up:    Return in about  6 weeks (around 08/30/2020), or if symptoms worsen or fail to improve, for Follow up with Dr. Elsworth Soho, Follow up for FULL PFT - 60 min. Dr. Elsworth Soho has available appointments the second week of December, please try to coordinate follow-up with Dr. Elsworth Soho.  Already established patient should can be 15-minute slot.  Notification of test results are managed in the following manner: If there are  any recommendations or changes to the  plan of care discussed in office today,  we will contact you and let you know what they are. If you do not hear from [place], then your results are normal and you can view them through your  MyChart account , or a  letter will be sent to you. Thank you again for trusting [place] with your care  - Thank you, [facility]    It is flu season:   >>> Best ways to protect herself from the flu: Receive the yearly flu vaccine, practice good hand hygiene washing with soap and also using hand sanitizer when available, eat a nutritious meals, get adequate rest, hydrate appropriately       Please contact the office if your symptoms worsen or you have concerns that you are not improving.   Thank you for choosing [facility] for your healthcare, and for allowing [place] to partner with you on your healthcare journey. I am thankful to be able to provide care to you today.   Wyn Quaker FNP-C

## 2020-07-19 NOTE — Assessment & Plan Note (Signed)
Plan: Continue follow-up with oncology Please notify oncology of your concerns about your Aromasin

## 2020-07-19 NOTE — Assessment & Plan Note (Signed)
Plan: Continue oxygen therapy as prescribed

## 2020-07-19 NOTE — Assessment & Plan Note (Signed)
Up-to-date with vaccinations

## 2020-07-19 NOTE — Assessment & Plan Note (Addendum)
History of previous pleural effusion that required thoracentesis by cardiology, patient reporting over 6 L was removed Patient with small right pleural effusion on chest x-ray from 07/18/2020  Plan: We will continue to clinically monitor small pleural effusion Continue diuretics We will repeat chest x-ray at next follow-up in 6 weeks Reviewed with patient and family member to notify our office if patient has acute worsening shortness of breath, orthopnea or weight increases or lower extremity swelling

## 2020-07-19 NOTE — Assessment & Plan Note (Signed)
Believe shortness of breath is likely multifactorial given known persistent A. fib, severe COPD based off of spirometry in 2019, chronic respiratory failure, Physical deconditioning, active oncology managed by Dr. Burr Medico, recent Aromasin start (dyspnea is listed as a potential adverse reaction and 10% of patients), CHF, small right pleural effusion  Plan: Follow-up with oncology regarding Aromasin Obtain pulmonary function testing Continue oxygen therapy Continue Pulmicort nebs Continue Brovana nebs Continue Yupelri nebs

## 2020-07-19 NOTE — Telephone Encounter (Signed)
Scheduled appt per 11/16 sch msg - pt is aware of appt date and time

## 2020-07-19 NOTE — Assessment & Plan Note (Signed)
Plan: Continue follow-up with cardiology Continue diuretics We will continue to clinically monitor the small right pleural effusion Continue to follow low-sodium diet Elevate legs when able

## 2020-07-19 NOTE — Assessment & Plan Note (Signed)
Plan: Continue follow-up with cardiology

## 2020-07-20 ENCOUNTER — Inpatient Hospital Stay: Payer: Medicare Other | Admitting: Hematology

## 2020-07-20 DIAGNOSIS — B0052 Herpesviral keratitis: Secondary | ICD-10-CM | POA: Diagnosis not present

## 2020-07-20 DIAGNOSIS — H401134 Primary open-angle glaucoma, bilateral, indeterminate stage: Secondary | ICD-10-CM | POA: Diagnosis not present

## 2020-07-20 DIAGNOSIS — H04123 Dry eye syndrome of bilateral lacrimal glands: Secondary | ICD-10-CM | POA: Diagnosis not present

## 2020-07-20 NOTE — Progress Notes (Signed)
[facility]   Telephone:(336) [phone] Fax:(336) [phone]   Clinic Follow up Note   Patient Care Team: Janith Lima, MD as PCP - General (Internal Medicine) Stanford Breed Denice Bors, MD as PCP - Cardiology (Cardiology) Rockwell Germany, RN as Oncology Nurse Navigator Mauro Kaufmann, RN as Oncology Nurse Navigator Truitt Merle, MD as Consulting Physician (Hematology) Fanny Skates, MD as Consulting Physician (General Surgery) Kyung Rudd, MD as Consulting Physician (Radiation Oncology) Alexis Frock, MD as Consulting Physician (Anesthesiology) Alexis Frock, MD as Consulting Physician (Urology) Stanford Breed Denice Bors, MD as Consulting Physician (Cardiology) Rigoberto Noel, MD as Consulting Physician (Pulmonary Disease)   I connected with [name] on 07/21/2020 at  1:00 PM EST by telephone visit and verified that I am speaking with the correct person using two identifiers.  I discussed the limitations, risks, security and privacy concerns of performing an evaluation and management service by telephone and the availability of in person appointments. I also discussed with the patient that there may be a patient responsible charge related to this service. The patient expressed understanding and agreed to proceed.   Other persons participating in the visit and their role in the encounter:  None   Patient's location:  Home  Provider's location:  Office   CHIEF COMPLAINT: f/u of left breast cancer  SUMMARY OF ONCOLOGIC HISTORY: Oncology History Overview Note  Cancer Staging Malignant neoplasm of upper-outer quadrant of left breast in female, estrogen receptor positive ([place]) Staging form: Breast, AJCC 8th Edition - Clinical stage from 04/01/2019: Stage IA (cT1c, cN0, cM0, G2, ER+, PR+, HER2-) - Signed by Truitt Merle, MD on 04/07/2019 - Pathologic stage from 05/04/2019: Stage Unknown (pT2, pNX, cM0, G2, ER+, PR+, HER2-) - Signed by Truitt Merle, MD on 05/27/2019    Malignant  neoplasm of upper-outer quadrant of left breast in female, estrogen receptor positive ([place])  03/30/2019 Mammogram   Left diagnostic mammogram and [place] 03/30/19  IMPRESSION The 1.6cm heterogenous focal area in the left breast at 1:00-2:00 4-6cm form the nipple, corresponding to the hard palpable area in the left breast is suspicious of malignancy, particularly lobular carcinoma.     04/01/2019 Cancer Staging   Staging form: Breast, AJCC 8th Edition - Clinical stage from 04/01/2019: Stage IA (cT1c, cN0, cM0, G2, ER+, PR+, HER2-) - Signed by Truitt Merle, MD on 04/07/2019   04/01/2019 Initial Biopsy   Diagnosis 04/01/19 Breast, left, needle core biopsy, 2 o'clock, 5cmfn - INVASIVE MAMMARY CARCINOMA, SEE COMMENT. - MAMMARY CARCINOMA IN SITU.   04/01/2019 Receptors her2   The tumor cells are NEGATIVE for Her2 (1+). Estrogen Receptor: 100%, POSITIVE, STRONG STAINING INTENSITY Progesterone Receptor: 30%, POSITIVE, STRONG STAINING INTENSITY Proliferation Marker Ki67: 20%   04/03/2019 Initial Diagnosis   Malignant neoplasm of upper-outer quadrant of left breast in female, estrogen receptor positive (HCC)    Genetic Testing   Negative genetic testing. No pathogenic variants identified on the [facility] Breast Cancer STAT Panel + Common Hereditary Cancers Panel. The STAT Breast cancer panel offered by [facility] includes sequencing and rearrangement analysis for the following 9 genes:  ATM, BRCA1, BRCA2, CDH1, CHEK2, PALB2, PTEN, STK11 and TP53.  The Common Hereditary Cancers Panel offered by [facility] includes sequencing and/or deletion duplication testing of the following 47 genes: APC, ATM, AXIN2, BARD1, BMPR1A, BRCA1, BRCA2, BRIP1, CDH1, CDKN2A (p14ARF), CDKN2A (p16INK4a), CKD4, CHEK2, CTNNA1, DICER1, EPCAM (Deletion/duplication testing only), GREM1 (promoter region deletion/duplication testing only), KIT, MEN1, MLH1, MSH2, MSH3, MSH6, MUTYH, NBN, NF1, NHTL1, PALB2,  PDGFRA, PMS2, POLD1, POLE, PTEN, RAD50, RAD51C,  RAD51D,  SDHB, SDHC, SDHD, SMAD4, SMARCA4. STK11, TP53, TSC1, TSC2, and VHL.  The following genes were evaluated for sequence changes only: SDHA and HOXB13 c.251G>A variant only. The report date is 04/17/2019.    05/04/2019 Surgery    LEFT TOTAL MASTECTOMY by Dr. Dalbert Batman 05/04/19   05/04/2019 Pathology Results   Diagnosis 05/04/19 1. Breast, excision - INVASIVE LOBULAR CARCINOMA, NOTTINGHAM GRADE 2 - SEE COMMENT 2. Breast, simple mastectomy, Left total - MULTIFOCAL, INVASIVE LOBULAR CARCINOMA, NOTTINGHAM GRADE 2 OF 3, 2.1 CM - MARGINS UNINVOLVED BY CARCINOMA (0.2 CM; POSTERIOR MARGIN) - LYMPHOVASCULAR SPACE INVASION PRESENT - PREVIOUS BIOPSY SITE CHANGES PRESENT - SEBORRHEIC KERATOSIS - SEE ONCOLOGY TABLE AND COMMENT BELOW   05/04/2019 Cancer Staging   Staging form: Breast, AJCC 8th Edition - Pathologic stage from 05/04/2019: Stage Unknown (pT2, pNX, cM0, G2, ER+, PR+, HER2-) - Signed by Truitt Merle, MD on 05/27/2019   05/31/2020 Breast US   Left Breast US 05/31/20 Multiple, raised, hard, pink lesions in the skin of th left chest surrounding the mastectomy scar. There is one inferior and lateral to the scar which has a corresponding 1.1cm vascular mass in teh skin at [place]. Dermatology consult for skin biopsy of the lesion is recommended to exclude recurrent breast cancer to the skin.    06/17/2020 Relapse/Recurrence   PUNCH BIOPSY by Dermatologist   Diagnosis 1. Skin Biopsy-(P), A) right inferior lateral antecubital shave & ED&C SEBORRHEIC KERATOSIS, IRRITATED  2. Skin Biopsy-(P), B) left inferior central chest punch  POORLY DIFFERENTIATED ADENOCARCINOMA CONSISTENT WITH BREAST ORIGIN INVOLVING THE DERMIS, SEE DESCRIPTION   HER2 NEGATIVE  ER Positive >95% PR NEGATIVE     06/27/2020 -  Anti-estrogen oral therapy   First-line Exemestane 78m once daily starting 06/27/20   07/08/2020 PET scan   IMPRESSION: 1. No metabolic activity associated with nodularity along the mastectomy scar in  the LEFT chest wall. 2. Two hypermetabolic RIGHT supraclavicular lymph nodes. Hypermetabolic activity may relate to RIGHT arm COVID vaccination of 10/ 03/2020. Estradiol FDG PET scan may differentiate versus attention on follow-up versus biopsy. 3. No evidence distant metastatic disease. 4. Moderate RIGHT pleural effusion and basilar atelectasis.      CURRENT THERAPY:  First-line Exemestane 220monce daily starting 06/25/20  INTERVAL HISTORY:  BaKINLEIGH NAULTs scheduled for a phone visit today due to her concern of back pain and worsening dyspnea.   She complains of worsening back pain since she started exemestane, she is not able to sleep on back anymore. No back pain when she stands, no other join pain.   Slightly worse dyspnea on exertion lately, saw pulmonary a few days ago, will do pulmonary function test next month. She is still on 2L oxygen most of time, but does require 3l/min some time when she goes out. No cough or chest pain. She  Is on neb twice daily.   No other new complains.   All other systems were reviewed with the patient and are negative.  MEDICAL HISTORY:  Past Medical History:  Diagnosis Date  . Arthritis   . COPD (chronic obstructive pulmonary disease) ([place]  . Dyspnea on exertion   . GERD (gastroesophageal reflux disease)   . Glaucoma of both eyes   . History of atrial fibrillation without current medication 2010   POST SURG 2010--  PER PT NO ISSUES SINCE  . History of breast cancer    DX DCIS IN 2004--  S/P RIGHT MASTECTOMY ,  NO CHEMORADIATION---  NO RECURRENCE  . History of CHF (congestive heart failure)    POST SURG 2010  . History of hypertension   . History of shingles    02/ 2015--  back of neck and left flank--  no residual pain  . ICH (intracerebral hemorrhage) ([place]) 02/05/2015  . Nephrolithiasis   . 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 Stroke [facility])     SURGICAL HISTORY: Past Surgical History:  Procedure Laterality Date  . CATARACT EXTRACTION W/ INTRAOCULAR LENS  IMPLANT, BILATERAL    . CRANIECTOMY N/A 02/05/2015   Procedure: SUBOCCIPITAL CRANIECTOMY EVACUATION OF HEMATOMA;  Surgeon: Consuella Lose, MD;  Location: [place];  Service: Neurosurgery;  Laterality: N/A;  . CYSTOSCOPY W/ RETROGRADES Bilateral 08/11/2014   Procedure: CYSTOSCOPY WITH BILATERAL RETROGRADE PYELOGRAM AND MITOMYCIN INSTILLATION;  Surgeon: Alexis Frock, MD;  [facility];  Service: Urology;  Laterality: Bilateral;  . CYSTOSCOPY W/ URETERAL STENT PLACEMENT Bilateral 03/26/2013   Procedure: CYSTOSCOPY WITH BILATERAL RETROGRADE PYELOGRAM/URETERAL STENT PLACEMENT;  Surgeon: Alexis Frock, MD;  Location: [facility];  Service: Urology;  Laterality: Bilateral;  . CYSTOSCOPY W/ URETERAL STENT PLACEMENT Left 05/13/2013   Procedure: CYSTOSCOPY WITH RETROGRADE PYELOGRAM/URETERAL STENT PLACEMENT STENT EXCHANGE;  Surgeon: Alexis Frock, MD;  Location: [facility];  Service: Urology;  Laterality: Left;  . PARTIAL MASTECTOMY WITH NEEDLE LOCALIZATION Right 01-14-2003   DCIS  . REMOVAL CYST LEFT HAND  2013  . TOTAL KNEE ARTHROPLASTY Right 03-22-2009  . TOTAL MASTECTOMY Right 02-03-2003   W/ SLN BX  AND  POST 03-01-2003 EVACUATION HEMATOMA  . TOTAL MASTECTOMY Left 05/04/2019   Procedure: LEFT TOTAL MASTECTOMY;  Surgeon: Fanny Skates, MD;  Location: [facility];  Service: General;  Laterality: Left;  . TRANSTHORACIC ECHOCARDIOGRAM  03-25-2009   MILD LVF/ EF 70-80%/ MILD INCREASE SYSTOLIC PULMONARY  PRESSURE  . TRANSURETHRAL RESECTION OF BLADDER TUMOR WITH GYRUS (TURBT-GYRUS) N/A 03/26/2013   Procedure: TRANSURETHRAL RESECTION OF BLADDER TUMOR WITH GYRUS (TURBT-GYRUS);  Surgeon: Alexis Frock, MD;  Location: [facility];  Service: Urology;  Laterality: N/A;  . TRANSURETHRAL  RESECTION OF BLADDER TUMOR WITH GYRUS (TURBT-GYRUS) N/A 05/13/2013   Procedure: TRANSURETHRAL RESECTION OF BLADDER TUMOR WITH GYRUS (TURBT-GYRUS)  RE-STAGING TRANSURETHRAL RESECTION OF BLADDER TUMOR, LEFT RETROGRADE PYELOGRAM AND STENT EXCHANGE;  Surgeon: Alexis Frock, MD;  Location: [facility];  Service: Urology;  Laterality: N/A;  . TRANSURETHRAL RESECTION OF BLADDER TUMOR WITH GYRUS (TURBT-GYRUS) N/A 08/11/2014   Procedure: TRANSURETHRAL RESECTION OF BLADDER TUMOR WITH GYRUS (TURBT-GYRUS);  Surgeon: Alexis Frock, MD;  Location: [facility];  Service: Urology;  Laterality: N/A;  . TUBAL LIGATION      I have reviewed the social history and family history with the patient and they are unchanged from previous note.  ALLERGIES:  is allergic to heparin and morphine and related.  MEDICATIONS:  Current Outpatient Medications  Medication Sig Dispense Refill  . albuterol (PROVENTIL) (2.5 MG/3ML) 0.083% nebulizer solution Take 3 mLs (2.5 mg total) by nebulization every 6 (six) hours as needed for wheezing or shortness of breath. 75 mL 11  . albuterol (VENTOLIN HFA) 108 (90 Base) MCG/ACT inhaler Inhale 2 puffs into the lungs every 6 (six) hours as needed for wheezing or shortness of breath. 8 g 2  . arformoterol (BROVANA) 15 MCG/2ML NEBU Take 2 mLs (15 mcg total)  by nebulization 2 (two) times daily. 120 mL 5  . atorvastatin (LIPITOR) 10 MG tablet TAKE ONE TABLET DAILY AT 6PM 90 tablet 3  . Brinzolamide-Brimonidine (SIMBRINZA) 1-0.2 % SUSP Place 1 drop into both eyes in the morning and at bedtime.    . budesonide (PULMICORT) 0.5 MG/2ML nebulizer solution Take 2 mLs (0.5 mg total) by nebulization in the morning and at bedtime. 120 mL 1  . digoxin (LANOXIN) 0.125 MG tablet TAKE ONE TABLET DAILY 90 tablet 2  . diltiazem (CARDIZEM CD) 120 MG 24 hr capsule Take 1 capsule (120 mg total) by mouth daily. 30 capsule 11  . exemestane (AROMASIN) 25 MG tablet Take 1 tablet (25 mg  total) by mouth daily after breakfast. 30 tablet 1  . fluorometholone (FML) 0.1 % ophthalmic suspension Place 1 drop into both eyes daily.    . furosemide (LASIX) 40 MG tablet Take 1 tablet in the am and 1/2 tablet in the pm 180 tablet 3  . guaiFENesin (MUCINEX) 600 MG 12 hr tablet Take 600 mg by mouth daily.    Marland Kitchen ipratropium (ATROVENT) 0.02 % nebulizer solution Take 2.5 mLs (0.5 mg total) by nebulization 3 (three) times daily. DX: J44.9 225 mL 5  . latanoprost (XALATAN) 0.005 % ophthalmic solution Place 1 drop into both eyes at bedtime.    . OXYGEN Inhale 2 L into the lungs continuous.     . pantoprazole (PROTONIX) 40 MG tablet TAKE ONE TABLET DAILY 90 tablet 1  . Polyethylene Glycol 3350 (MIRALAX PO) Take 17 g by mouth daily.    . potassium chloride SA (KLOR-CON) 20 MEQ tablet Take 20 meq by mouth in the morning and 10 meq in the evening    . valACYclovir (VALTREX) 1000 MG tablet Take 1,000 mg by mouth daily.     No current facility-administered medications for this visit.    PHYSICAL EXAMINATION: ECOG PERFORMANCE STATUS: 3 - Symptomatic, >50% confined to bed  No vitals taken today, Exam not performed today   LABORATORY DATA:  I have reviewed the data as listed CBC Latest Ref Rng & Units 05/26/2020 12/14/2019 12/10/2019  WBC 4.0 - 10.5 K/uL 8.9 9.0 9.3  Hemoglobin 12.0 - 15.0 g/dL 14.4 13.2 13.5  Hematocrit 36 - 46 % 44.2 43.0 44.1  Platelets 150 - 400 K/uL 235 296 278     CMP Latest Ref Rng & Units 07/11/2020 06/14/2020 05/26/2020  Glucose 65 - 99 mg/dL 121(H) 92 91  BUN 8 - 27 mg/dL _0 Creatinine 0.57 - 1.00 mg/dL 0.79 0.84 0.84  Sodium 134 - 144 mmol/L 145(H) 140 139  Potassium 3.5 - 5.2 mmol/L 4.4 4.4 4.1  Chloride 96 - 106 mmol/L 104 99 102  CO2 20 - 29 mmol/L _1 Calcium 8.7 - 10.3 mg/dL 9.1 9.0 9.0  Total Protein 6.5 - 8.1 g/dL - - 6.9  Total Bilirubin 0.3 - 1.2 mg/dL - - 1.0  Alkaline Phos 38 - 126 U/L - - 137(H)  AST 15 - 41 U/L - - 21  ALT 0 - 44 U/L - -  15      RADIOGRAPHIC STUDIES: I have personally reviewed the radiological images as listed and agreed with the findings in the report. No results found.   ASSESSMENT & PLAN:  [name] is a 85 y.o. female with   1.Malignant neoplasm of upper-outer quadrant of left breast,lobularcarcinoma, Stage 1A pT2cN0M0, ER/PR+, HER2-,Grade II, chest wall skin recurrence 05/2020 ER+/PR+/HER2- -She was  diagnosed in 03/2019. She underwent left totalmastectomyby Dr Dalbert Batman on 05/04/19. -Genetictesting was negative for pathogenetic mutations -Oncotype was not recommend due to her advanced age andmedical comorbidities -Givenearly stage disease,post mastectomy radiation is not recommended.  -Patient declined antiestrogen therapy due to age and medical co-morbidities -She unfortunately developed local skin recurrence from breast cancer in 06/2020. Punch biopsy was done by her dermatologist on 06/17/20. Her 07/08/20 PET shows hypermetabolic right supraclavicular nodes, and negative uptake on the left chest wall with no skin recurrence, no other metastasis.  -Plan to proceed with Cerianna PET scan on 08/05/20 to see if her right supraclavicular lymph node are ER positive, which would support metastasis from breast cancer, otherwise it is likely related to her recent vaccine injections. -Given her ER positive disease I started her on first line Exemestane 71m once daily starting 06/27/20. She is tolerating well -Due to her advanced age and medical comorbidities, I will hold on CK4/6 inhibitor for now.  -she has developed some back pain on certain position, it may or may not be related to exemestane, pt agree to continue for now. We discussed switching to tamoxifen if back pain gets worse  -Cerianna PET scan is scheduled for 12/3, will see her back on 12/10    PLAN: -will continue exemestane for now, we discussed switching to Tamoxifen if symptoms get worse  -she is scheduled to f/u on 12/10 after  Cerianna PET on 12/3    No problem-specific Assessment & Plan notes found for this encounter.   No orders of the defined types were placed in this encounter.  I discussed the assessment and treatment plan with the patient. The patient was provided an opportunity to ask questions and all were answered. The patient agreed with the plan and demonstrated an understanding of the instructions.  The patient was advised to call back or seek an in-person evaluation if the symptoms worsen or if the condition fails to improve as anticipated.  The total time spent in the appointment was 15 minutes.    YTruitt Merle MD 07/21/2020   I, AJoslyn Devon am acting as scribe for YTruitt Merle MD.   I have reviewed the above documentation for accuracy and completeness, and I agree with the above.

## 2020-07-21 ENCOUNTER — Other Ambulatory Visit: Payer: Self-pay | Admitting: Pulmonary Disease

## 2020-07-21 ENCOUNTER — Telehealth: Payer: Self-pay | Admitting: Pulmonary Disease

## 2020-07-21 ENCOUNTER — Encounter: Payer: Self-pay | Admitting: Hematology

## 2020-07-21 ENCOUNTER — Inpatient Hospital Stay (HOSPITAL_BASED_OUTPATIENT_CLINIC_OR_DEPARTMENT_OTHER): Payer: Medicare Other | Admitting: Hematology

## 2020-07-21 DIAGNOSIS — C50412 Malignant neoplasm of upper-outer quadrant of left female breast: Secondary | ICD-10-CM | POA: Diagnosis not present

## 2020-07-21 DIAGNOSIS — Z170 Estrogen receptor positive status [ER+]: Secondary | ICD-10-CM

## 2020-07-21 MED ORDER — ALBUTEROL SULFATE HFA 108 (90 BASE) MCG/ACT IN AERS: 2.0000 | INHALATION_SPRAY | Freq: Four times a day (QID) | RESPIRATORY_TRACT | 2 refills | Status: AC | PRN

## 2020-07-21 NOTE — Telephone Encounter (Signed)
Called and spoke to patient. Patient stated that she was given albuterol nebulizer solution at last office visit. She would like albuterol HFA instead.  She has an albuterol HFA, however it has expired.  Patient educated on usage of albuterol nebulizer (use when at home) and [facility] (use when away from home). She stated that she would prefer to use HFA only. She stated that she does not want wake up at 4:00 in the morning and do a breathing treatment when sob.  Aaron Edelman, please advise. Thanks

## 2020-07-21 NOTE — Telephone Encounter (Signed)
Spoke with the pt and notified that we will send rx for albuterol hfa  I verified her pharm and rx has been sent  Nothing further needed

## 2020-07-21 NOTE — Telephone Encounter (Signed)
Patient is returning phone call. Patient phone number is [phone].

## 2020-07-21 NOTE — Progress Notes (Signed)
HPI: FU atrial fibrillationand CHF. Patient was admitted in June 2016 with a large right cerebellar hemorrhage. Note her only anticoagulate at that time was aspirin. The hemorrhage required surgical evacuation. Patient developed atrial fibrillation during the hospitalization with difficult to control heart rate. She was felt not to be a candidate for anticoagulation given intracranial hemorrhage. Holter 7/16 showed atrial fibrillation with elevated rate; digoxin added. Ipreviously contacted Dr Kathyrn Sheriff Neurosurgery to see if she would be a candidate for short-term anticoagulation which would then qualify her for a watchman device. He felt this could be pursued.However she declined watchman. Patient admitted April 2021 with CHF. Echocardiogram April 2021 showed vigorous LV systolic function, mild RV dysfunction, severe biatrial enlargement, moderate pulmonary hypertension.  Chest x-ray November 2021 showed small right pleural effusion.  Since last seen,  she continues to have significant dyspnea on exertion.  She denies orthopnea, PND, chest pain or syncope.  She describes some edema in her knees.  Patient states that most of her dyspnea improves with bronchodilators.  Current Outpatient Medications  Medication Sig Dispense Refill  . albuterol (VENTOLIN HFA) 108 (90 Base) MCG/ACT inhaler Inhale 2 puffs into the lungs every 6 (six) hours as needed for wheezing or shortness of breath. 8 g 2  . arformoterol (BROVANA) 15 MCG/2ML NEBU Take 2 mLs (15 mcg total) by nebulization 2 (two) times daily. 120 mL 5  . atorvastatin (LIPITOR) 10 MG tablet TAKE ONE TABLET DAILY AT 6PM 90 tablet 3  . Brinzolamide-Brimonidine (SIMBRINZA) 1-0.2 % SUSP Place 1 drop into both eyes in the morning and at bedtime.    . budesonide (PULMICORT) 0.5 MG/2ML nebulizer solution TAKE 21m BY NEBULIZER IN THE MORNING ANDAT BEDTIME 120 mL 1  . digoxin (LANOXIN) 0.125 MG tablet TAKE ONE TABLET DAILY 90 tablet 2  . diltiazem  (CARDIZEM CD) 120 MG 24 hr capsule Take 1 capsule (120 mg total) by mouth daily. 30 capsule 11  . exemestane (AROMASIN) 25 MG tablet Take 1 tablet (25 mg total) by mouth daily after breakfast. 30 tablet 1  . fluorometholone (FML) 0.1 % ophthalmic suspension Place 1 drop into both eyes daily.    . furosemide (LASIX) 40 MG tablet Take 1 tablet in the am and 1/2 tablet in the pm 180 tablet 3  . guaiFENesin (MUCINEX) 600 MG 12 hr tablet Take 600 mg by mouth daily.    .Marland Kitchenipratropium (ATROVENT) 0.02 % nebulizer solution Take 2.5 mLs (0.5 mg total) by nebulization 3 (three) times daily. DX: J44.9 225 mL 5  . latanoprost (XALATAN) 0.005 % ophthalmic solution Place 1 drop into both eyes at bedtime.    . OXYGEN Inhale 2 L into the lungs continuous.     . pantoprazole (PROTONIX) 40 MG tablet TAKE ONE TABLET DAILY 90 tablet 1  . Polyethylene Glycol 3350 (MIRALAX PO) Take 17 g by mouth daily.    . potassium chloride SA (KLOR-CON) 20 MEQ tablet Take 20 meq by mouth in the morning and 10 meq in the evening    . valACYclovir (VALTREX) 1000 MG tablet Take 1,000 mg by mouth daily.     No current facility-administered medications for this visit.     Past Medical History:  Diagnosis Date  . Arthritis   . [place]   . Dyspnea on exertion   . GERD (gastroesophageal reflux disease)   . Glaucoma of both eyes   . History of atrial fibrillation without current medication 2010  POST SURG 2010--  PER PT NO ISSUES SINCE  . History of breast cancer    DX DCIS IN 2004--  S/P RIGHT MASTECTOMY , NO CHEMORADIATION---  NO RECURRENCE  . History of CHF (congestive heart failure)    POST SURG 2010  . History of hypertension   . History of shingles    02/ 2015--  back of neck and left flank--  no residual pain  . ICH (intracerebral hemorrhage) ([place]) 02/05/2015  . Nephrolithiasis   . Recurrent bladder papillary carcinoma ([place]) first dx 06/ 2014   s/p  turbt's  and instillation  mitomycin c (chemo)//dx again in 2015-different type  . Stroke [facility])     Past Surgical History:  Procedure Laterality Date  . CATARACT EXTRACTION W/ INTRAOCULAR LENS  IMPLANT, BILATERAL    . CRANIECTOMY N/A 02/05/2015   Procedure: SUBOCCIPITAL CRANIECTOMY EVACUATION OF HEMATOMA;  Surgeon: Consuella Lose, MD;  Location: [place] [facility];  Service: Neurosurgery;  Laterality: N/A;  . CYSTOSCOPY W/ RETROGRADES Bilateral 08/11/2014   Procedure: CYSTOSCOPY WITH BILATERAL RETROGRADE PYELOGRAM AND MITOMYCIN INSTILLATION;  Surgeon: Alexis Frock, MD;  Location[facility];  Service: Urology;  Laterality: Bilateral;  . CYSTOSCOPY W/ URETERAL STENT PLACEMENT Bilateral 03/26/2013   Procedure: CYSTOSCOPY WITH BILATERAL RETROGRADE PYELOGRAM/URETERAL STENT PLACEMENT;  Surgeon: Alexis Frock, MD;  Location: [facility];  Service: Urology;  Laterality: Bilateral;  . CYSTOSCOPY W/ URETERAL STENT PLACEMENT Left 05/13/2013   Procedure: CYSTOSCOPY WITH RETROGRADE PYELOGRAM/URETERAL STENT PLACEMENT STENT EXCHANGE;  Surgeon: Alexis Frock, MD;  Location: [facility];  Service: Urology;  Laterality: Left;  . PARTIAL MASTECTOMY WITH NEEDLE LOCALIZATION Right 01-14-2003   DCIS  . REMOVAL CYST LEFT HAND  2013  . TOTAL KNEE ARTHROPLASTY Right 03-22-2009  . TOTAL MASTECTOMY Right 02-03-2003   W/ SLN BX  AND  POST 03-01-2003 EVACUATION HEMATOMA  . TOTAL MASTECTOMY Left 05/04/2019   Procedure: LEFT TOTAL MASTECTOMY;  Surgeon: Fanny Skates, MD;  Location: [place];  Service: General;  Laterality: Left;  . TRANSTHORACIC ECHOCARDIOGRAM  03-25-2009   MILD LVF/ EF 70-80%/ MILD INCREASE SYSTOLIC PULMONARY  PRESSURE  . TRANSURETHRAL RESECTION OF BLADDER TUMOR WITH GYRUS (TURBT-GYRUS) N/A 03/26/2013   Procedure: TRANSURETHRAL RESECTION OF BLADDER TUMOR WITH GYRUS (TURBT-GYRUS);  Surgeon: Alexis Frock, MD;  Location: [facility];  Service: Urology;  Laterality: N/A;  .  TRANSURETHRAL RESECTION OF BLADDER TUMOR WITH GYRUS (TURBT-GYRUS) N/A 05/13/2013   Procedure: TRANSURETHRAL RESECTION OF BLADDER TUMOR WITH GYRUS (TURBT-GYRUS)  RE-STAGING TRANSURETHRAL RESECTION OF BLADDER TUMOR, LEFT RETROGRADE PYELOGRAM AND STENT EXCHANGE;  Surgeon: Alexis Frock, MD;  Location: [facility];  Service: Urology;  Laterality: N/A;  . TRANSURETHRAL RESECTION OF BLADDER TUMOR WITH GYRUS (TURBT-GYRUS) N/A 08/11/2014   Procedure: TRANSURETHRAL RESECTION OF BLADDER TUMOR WITH GYRUS (TURBT-GYRUS);  Surgeon: Alexis Frock, MD;  Location[facility];  Service: Urology;  Laterality: N/A;  . TUBAL LIGATION      Social History   Socioeconomic History  . Marital status: Married    Spouse name: Not on file  . Number of children: 4  . Years of education: Not on file  . Highest education level: Not on file  Occupational History  . Not on file  Tobacco Use  . Smoking status: Former Smoker    Packs/day: 1.00    Years: 40.00    Pack years: 40.00    Types: Cigarettes    Quit date: 03/20/2003    Years since quitting: 17.3  .  Smokeless tobacco: Never Used  Vaping Use  . Vaping Use: Never used  Substance and Sexual Activity  . Alcohol use: No    Alcohol/week: 0.0 standard drinks  . Drug use: No  . Sexual activity: Not Currently    Partners: Male    Birth control/protection: Surgical    Comment: BTL  Other Topics Concern  . Not on file  Social History Narrative  . Not on file   Social Determinants of Health   Financial Resource Strain:   . Difficulty of Paying Living Expenses: Not on file  Food Insecurity:   . Worried About Charity fundraiser in the Last Year: Not on file  . Ran Out of Food in the Last Year: Not on file  Transportation Needs:   . Lack of Transportation (Medical): Not on file  . Lack of Transportation (Non-Medical): Not on file  Physical Activity:   . Days of Exercise per Week: Not on file  . Minutes of Exercise per  Session: Not on file  Stress:   . Feeling of Stress : Not on file  Social Connections:   . Frequency of Communication with Friends and Family: Not on file  . Frequency of Social Gatherings with Friends and Family: Not on file  . Attends Religious Services: Not on file  . Active Member of Clubs or Organizations: Not on file  . Attends Archivist Meetings: Not on file  . Marital Status: Not on file  Intimate Partner Violence:   . Fear of Current or Ex-Partner: Not on file  . Emotionally Abused: Not on file  . Physically Abused: Not on file  . Sexually Abused: Not on file    Family History  Problem Relation Age of Onset  . Colon cancer Mother 70  . Colon cancer Maternal Grandmother   . Stroke Maternal Grandmother   . Breast cancer Maternal Aunt 62  . Cancer Maternal Aunt        ? ovarian  . Breast cancer Other 60       breast cancer, ovarian cancer  . Breast cancer Daughter 68  . Stroke Paternal Grandmother   . Prostate cancer Paternal Grandfather   . Heart attack Neg Hx   . Hypertension Neg Hx     ROS: no fevers or chills, productive cough, hemoptysis, dysphasia, odynophagia, melena, hematochezia, dysuria, hematuria, rash, seizure activity, orthopnea, PND, pedal edema, claudication. Remaining systems are negative.  Physical Exam: Well-developed chronically ill appearing in no acute distress.  Skin is warm and dry.  HEENT is normal.  Neck is supple.  Chest diminished BS bilaterallly Cardiovascular exam is irregular Abdominal exam nontender or distended. No masses palpated. Extremities show trace edema. neuro grossly intact   A/P  1 atrial fibrillation-continue Cardizem and digoxin.  As outlined in previous notes she is not a candidate for long-term anticoagulation given prior intracranial hemorrhage on aspirin alone.  We discussed short-term anticoagulantion/watchman but she declined.  She understands the higher risk of CVA off of anticoagulation.  2  dyspnea-felt to be multifactorial.  She has significant underlying COPD.  She also has diastolic congestive heart failure.  Recent chest x-ray showed small pleural effusion and therefore thoracentesis likely not helpful.  I will increase Lasix to 40 mg twice daily.  Check potassium, renal function and BNP.  Check potassium and renal function again in 1 week.  Aromasin can also contribute to dyspnea.  Note her dyspnea improves predominantly with bronchodilators and I think this is likely mostly  from pulmonary disease.  3 hypertension-blood pressure has been borderline.  We will continue to follow and adjust regimen as needed.  4 hyperlipidemia-continue statin.  5 COPD-managed by pulmonary.  6 recurrent breast cancer-managed by oncology.  Kirk Ruths, MD

## 2020-07-21 NOTE — Telephone Encounter (Signed)
Okay with prescribing albuterol HFA.Can send in two Guido Sander, FNP

## 2020-07-21 NOTE — Telephone Encounter (Signed)
Lm for patient.  

## 2020-07-25 ENCOUNTER — Ambulatory Visit: Payer: Medicare Other | Admitting: Hematology

## 2020-07-25 ENCOUNTER — Other Ambulatory Visit: Payer: Medicare Other

## 2020-07-29 ENCOUNTER — Other Ambulatory Visit: Payer: Self-pay | Admitting: Internal Medicine

## 2020-07-29 DIAGNOSIS — J418 Mixed simple and mucopurulent chronic bronchitis: Secondary | ICD-10-CM

## 2020-08-02 ENCOUNTER — Ambulatory Visit: Payer: Medicare Other | Admitting: Hematology

## 2020-08-02 ENCOUNTER — Encounter: Payer: Self-pay | Admitting: Cardiology

## 2020-08-02 ENCOUNTER — Other Ambulatory Visit: Payer: Self-pay

## 2020-08-02 ENCOUNTER — Other Ambulatory Visit: Payer: Medicare Other

## 2020-08-02 ENCOUNTER — Ambulatory Visit (INDEPENDENT_AMBULATORY_CARE_PROVIDER_SITE_OTHER): Payer: Medicare Other | Admitting: Cardiology

## 2020-08-02 VITALS — BP 96/57 | HR 58 | Ht 66.0 in | Wt 138.4 lb

## 2020-08-02 DIAGNOSIS — R0902 Hypoxemia: Secondary | ICD-10-CM | POA: Diagnosis not present

## 2020-08-02 DIAGNOSIS — J449 Chronic obstructive pulmonary disease, unspecified: Secondary | ICD-10-CM

## 2020-08-02 DIAGNOSIS — I10 Essential (primary) hypertension: Secondary | ICD-10-CM | POA: Diagnosis not present

## 2020-08-02 DIAGNOSIS — I5032 Chronic diastolic (congestive) heart failure: Secondary | ICD-10-CM | POA: Diagnosis not present

## 2020-08-02 DIAGNOSIS — I4821 Permanent atrial fibrillation: Secondary | ICD-10-CM | POA: Diagnosis not present

## 2020-08-02 MED ORDER — FUROSEMIDE 40 MG PO TABS: ORAL_TABLET | ORAL | 3 refills | Status: DC

## 2020-08-02 NOTE — Patient Instructions (Signed)
Medication Instructions:   INCREASE FUROSEMIDE TO 40 MG TWICE DAILY= 1 WHOLE TABLET TWICE DAILY  *If you need a refill on your cardiac medications before your next appointment, please call your pharmacy*   Lab Work:  Your physician recommends that you HAVE LAB [place]  Your physician recommends that you return for lab work in: [place]  If you have labs (blood work) drawn today and your tests are completely normal, you will receive your results only by: Marland Kitchen MyChart Message (if you have MyChart) OR . A paper copy in the mail If you have any lab test that is abnormal or we need to change your treatment, we will call you to review the results.  Follow-Up: At [facility], you and your health needs are our priority.  As part of our continuing mission to provide you with exceptional heart care, we have created designated Provider Care Teams.  These Care Teams include your primary Cardiologist (physician) and Advanced Practice Providers (APPs -  Physician Assistants and Nurse Practitioners) who all work together to provide you with the care you need, when you need it.  We recommend signing up for the patient portal called "MyChart".  Sign up information is provided on this After Visit Summary.  MyChart is used to connect with patients for Virtual Visits (Telemedicine).  Patients are able to view lab/test results, encounter notes, upcoming appointments, etc.  Non-urgent messages can be sent to your provider as well.   To learn more about what you can do with MyChart, go to [email or web address].    Your next appointment:   8 week(s)  The format for your next appointment:   In Person  Provider:   Kirk Ruths, MD

## 2020-08-03 LAB — BASIC METABOLIC PANEL
BUN/Creatinine Ratio: 19 (ref 12–28)
BUN: 15 mg/dL (ref 8–27)
CO2: 30 mmol/L — ABNORMAL HIGH (ref 20–29)
Calcium: 9.2 mg/dL (ref 8.7–10.3)
Chloride: 103 mmol/L (ref 96–106)
Creatinine, Ser: 0.8 mg/dL (ref 0.57–1.00)
GFR calc Af Amer: 79 mL/min/{1.73_m2} (ref >59)
GFR calc non Af Amer: 68 mL/min/{1.73_m2} (ref >59)
Glucose: 92 mg/dL (ref 65–99)
Potassium: 4.5 mmol/L (ref 3.5–5.2)
Sodium: 144 mmol/L (ref 134–144)

## 2020-08-03 LAB — PRO B NATRIURETIC PEPTIDE: NT-Pro BNP: 4565 pg/mL — ABNORMAL HIGH (ref 0–738)

## 2020-08-05 ENCOUNTER — Ambulatory Visit (HOSPITAL_COMMUNITY)
Admission: RE | Admit: 2020-08-05 | Discharge: 2020-08-05 | Disposition: A | Discharge status: Home / Self Care | Payer: Medicare Other | Source: Ambulatory Visit | Attending: Hematology | Admitting: Hematology

## 2020-08-05 ENCOUNTER — Other Ambulatory Visit: Payer: Self-pay

## 2020-08-05 DIAGNOSIS — C801 Malignant (primary) neoplasm, unspecified: Secondary | ICD-10-CM | POA: Diagnosis not present

## 2020-08-05 DIAGNOSIS — C50412 Malignant neoplasm of upper-outer quadrant of left female breast: Secondary | ICD-10-CM | POA: Insufficient documentation

## 2020-08-05 DIAGNOSIS — Z170 Estrogen receptor positive status [ER+]: Secondary | ICD-10-CM | POA: Insufficient documentation

## 2020-08-05 DIAGNOSIS — C7981 Secondary malignant neoplasm of breast: Secondary | ICD-10-CM | POA: Diagnosis not present

## 2020-08-05 MED ORDER — FLUOROESTRADIOL F 18 4-100 MCI/ML IV SOLN
6.0000 | Freq: Once | INTRAVENOUS | Status: AC
  Administered 2020-08-05: 5.8 via INTRAVENOUS

## 2020-08-09 DIAGNOSIS — I5032 Chronic diastolic (congestive) heart failure: Secondary | ICD-10-CM | POA: Diagnosis not present

## 2020-08-10 ENCOUNTER — Telehealth: Payer: Self-pay | Admitting: Hematology

## 2020-08-10 LAB — BASIC METABOLIC PANEL
BUN/Creatinine Ratio: 19 (ref 12–28)
BUN: 17 mg/dL (ref 8–27)
CO2: 26 mmol/L (ref 20–29)
Calcium: 9 mg/dL (ref 8.7–10.3)
Chloride: 99 mmol/L (ref 96–106)
Creatinine, Ser: 0.91 mg/dL (ref 0.57–1.00)
GFR calc Af Amer: 67 mL/min/{1.73_m2} (ref >59)
GFR calc non Af Amer: 59 mL/min/{1.73_m2} — ABNORMAL LOW (ref >59)
Glucose: 87 mg/dL (ref 65–99)
Potassium: 4.2 mmol/L (ref 3.5–5.2)
Sodium: 140 mmol/L (ref 134–144)

## 2020-08-10 NOTE — Telephone Encounter (Signed)
Patient called me and asked to reschedule appointment on 12/10. She is unable to come in in the mornings. She requested to only see Dr. Burr Medico so I rescheduled her to Dr. Ernestina Penna first afternoon availability which was 1/7. Patient is aware of updated appointment date and time.

## 2020-08-10 NOTE — Progress Notes (Incomplete)
[place]   [phone] Fax:([phone]) [phone]   Clinic Follow up Note   Patient Care Team: Janith Lima, MD as PCP - General (Internal Medicine) Stanford Breed, Denice Bors, MD as PCP - Cardiology (Cardiology) Rockwell Germany, RN as Oncology Nurse Navigator Mauro Kaufmann, RN as Oncology Nurse Navigator Truitt Merle, MD as Consulting Physician (Hematology) Fanny Skates, MD as Consulting Physician (General Surgery) Kyung Rudd, MD as Consulting Physician (Radiation Oncology) Alexis Frock, MD as Consulting Physician (Anesthesiology) Alexis Frock, MD as Consulting Physician (Urology) [name], MD as Consulting Physician (Cardiology) Rigoberto Noel, MD as Consulting Physician (Pulmonary Disease)  Date of Service:  08/10/2020  CHIEF COMPLAINT: f/u of left breast cancer  SUMMARY OF ONCOLOGIC HISTORY: Oncology History Overview Note  Cancer Staging Malignant neoplasm of upper-outer quadrant of left breast in female, estrogen receptor positive ([place]) Staging form: Breast, AJCC 8th Edition - Clinical stage from 04/01/2019: Stage IA (cT1c, cN0, cM0, G2, ER+, PR+, HER2-) - Signed by Truitt Merle, MD on 04/07/2019 - Pathologic stage from 05/04/2019: Stage Unknown (pT2, pNX, cM0, G2, ER+, PR+, HER2-) - Signed by Truitt Merle, MD on 05/27/2019    Malignant neoplasm of upper-outer quadrant of left breast in female, estrogen receptor positive ([place])  03/30/2019 Mammogram   Left diagnostic mammogram and [place] 03/30/19  IMPRESSION The 1.6cm heterogenous focal area in the left breast at 1:00-2:00 4-6cm form the nipple, corresponding to the hard palpable area in the left breast is suspicious of malignancy, particularly lobular carcinoma.     04/01/2019 Cancer Staging   Staging form: Breast, AJCC 8th Edition - Clinical stage from 04/01/2019: Stage IA (cT1c, cN0, cM0, G2, ER+, PR+, HER2-) - Signed by Truitt Merle, MD on 04/07/2019   04/01/2019 Initial Biopsy   Diagnosis 04/01/19 Breast,  left, needle core biopsy, 2 o'clock, 5cmfn - INVASIVE MAMMARY CARCINOMA, SEE COMMENT. - MAMMARY CARCINOMA IN SITU.   04/01/2019 Receptors her2   The tumor cells are NEGATIVE for Her2 (1+). Estrogen Receptor: 100%, POSITIVE, STRONG STAINING INTENSITY Progesterone Receptor: 30%, POSITIVE, STRONG STAINING INTENSITY Proliferation Marker Ki67: 20%   04/03/2019 Initial Diagnosis   Malignant neoplasm of upper-outer quadrant of left breast in female, estrogen receptor positive ([facility])    Genetic Testing   Negative genetic testing. No pathogenic variants identified on the [facility] Breast Cancer STAT Panel + Common Hereditary Cancers Panel. The STAT Breast cancer panel offered by [facility] includes sequencing and rearrangement analysis for the following 9 genes:  ATM, BRCA1, BRCA2, CDH1, CHEK2, PALB2, PTEN, STK11 and TP53.  The Common Hereditary Cancers Panel offered by [facility] includes sequencing and/or deletion duplication testing of the following 47 genes: APC, ATM, AXIN2, BARD1, BMPR1A, BRCA1, BRCA2, BRIP1, CDH1, CDKN2A (p14ARF), CDKN2A (p16INK4a), CKD4, CHEK2, CTNNA1, DICER1, EPCAM (Deletion/duplication testing only), GREM1 (promoter region deletion/duplication testing only), KIT, MEN1, MLH1, MSH2, MSH3, MSH6, MUTYH, NBN, NF1, NHTL1, PALB2, PDGFRA, PMS2, POLD1, POLE, PTEN, RAD50, RAD51C, RAD51D,  SDHB, SDHC, SDHD, SMAD4, SMARCA4. STK11, TP53, TSC1, TSC2, and VHL.  The following genes were evaluated for sequence changes only: SDHA and HOXB13 c.251G>A variant only. The report date is 04/17/2019.    05/04/2019 Surgery    LEFT TOTAL MASTECTOMY by Dr. Dalbert Batman 05/04/19   05/04/2019 Pathology Results   Diagnosis 05/04/19 1. Breast, excision - INVASIVE LOBULAR CARCINOMA, NOTTINGHAM GRADE 2 - SEE COMMENT 2. Breast, simple mastectomy, Left total - MULTIFOCAL, INVASIVE LOBULAR CARCINOMA, NOTTINGHAM GRADE 2 OF 3, 2.1 CM - MARGINS UNINVOLVED BY CARCINOMA (0.2 CM; POSTERIOR MARGIN) -  LYMPHOVASCULAR SPACE INVASION  PRESENT - PREVIOUS BIOPSY SITE CHANGES PRESENT - SEBORRHEIC KERATOSIS - SEE ONCOLOGY TABLE AND COMMENT BELOW   05/04/2019 Cancer Staging   Staging form: Breast, AJCC 8th Edition - Pathologic stage from 05/04/2019: Stage Unknown (pT2, pNX, cM0, G2, ER+, PR+, HER2-) - Signed by Truitt Merle, MD on 05/27/2019   05/31/2020 Breast US   Left Breast US 05/31/20 Multiple, raised, hard, pink lesions in the skin of th left chest surrounding the mastectomy scar. There is one inferior and lateral to the scar which has a corresponding 1.1cm vascular mass in teh skin at [place]. Dermatology consult for skin biopsy of the lesion is recommended to exclude recurrent breast cancer to the skin.    06/17/2020 Relapse/Recurrence   PUNCH BIOPSY by Dermatologist   Diagnosis 1. Skin Biopsy-(P), A) right inferior lateral antecubital shave & ED&C SEBORRHEIC KERATOSIS, IRRITATED  2. Skin Biopsy-(P), B) left inferior central chest punch  POORLY DIFFERENTIATED ADENOCARCINOMA CONSISTENT WITH BREAST ORIGIN INVOLVING THE DERMIS, SEE DESCRIPTION   HER2 NEGATIVE  ER Positive >95% PR NEGATIVE     06/27/2020 -  Anti-estrogen oral therapy   First-line Exemestane 32m once daily starting 06/27/20   07/08/2020 PET scan   IMPRESSION: 1. No metabolic activity associated with nodularity along the mastectomy scar in the LEFT chest wall. 2. Two hypermetabolic RIGHT supraclavicular lymph nodes. Hypermetabolic activity may relate to RIGHT arm COVID vaccination of 10/ 03/2020. Estradiol FDG PET scan may differentiate versus attention on follow-up versus biopsy. 3. No evidence distant metastatic disease. 4. Moderate RIGHT pleural effusion and basilar atelectasis.      CURRENT THERAPY:  First-line Exemestane 222monce daily starting 06/25/20  INTERVAL HISTORY: *** BaLANDRI DORSAINVILs here for a follow up. She presents to the clinic alone.    REVIEW OF SYSTEMS:  *** Constitutional: Denies fevers, chills or abnormal weight  loss Eyes: Denies blurriness of vision Ears, nose, mouth, throat, and face: Denies mucositis or sore throat Respiratory: Denies cough, dyspnea or wheezes Cardiovascular: Denies palpitation, chest discomfort or lower extremity swelling Gastrointestinal:  Denies nausea, heartburn or change in bowel habits Skin: Denies abnormal skin rashes Lymphatics: Denies new lymphadenopathy or easy bruising Neurological:Denies numbness, tingling or new weaknesses Behavioral/Psych: Mood is stable, no new changes  All other systems were reviewed with the patient and are negative.  MEDICAL HISTORY:  Past Medical History:  Diagnosis Date  . Arthritis   . COPD (chronic obstructive pulmonary disease) ([facility]  . Dyspnea on exertion   . GERD (gastroesophageal reflux disease)   . Glaucoma of both eyes   . History of atrial fibrillation without current medication 2010   POST SURG 2010--  PER PT NO ISSUES SINCE  . History of breast cancer    DX DCIS IN 2004--  S/P RIGHT MASTECTOMY , NO CHEMORADIATION---  NO RECURRENCE  . History of CHF (congestive heart failure)    POST SURG 2010  . History of hypertension   . History of shingles    02/ 2015--  back of neck and left flank--  no residual pain  . ICH (intracerebral hemorrhage) ([facility]/12[place]2014  . Nephrolithiasis   . Recurrent bladder papillary carcinoma ([facility] [place]   s/p  turbt's  and instillation mitomycin c (chemo)//dx again in 2015-different type  . [facility]    SURGICAL HISTORY: Past Surgical History:  Procedure Laterality Date  . CATARACT EXTRACTION W/ INTRAOCULAR LENS  IMPLANT, BILATERAL    . CRANIECTOMY N/A 02/05/2015  Procedure: SUBOCCIPITAL CRANIECTOMY EVACUATION OF HEMATOMA;  Surgeon: Consuella Lose, MD;  Location: [facility];  Service: Neurosurgery;  Laterality: N/A;  . CYSTOSCOPY W/ RETROGRADES Bilateral 08/11/2014   Procedure: CYSTOSCOPY WITH BILATERAL RETROGRADE PYELOGRAM AND MITOMYCIN INSTILLATION;  Surgeon: Alexis Frock, MD;  Location[facility];  Service: Urology;  Laterality: Bilateral;  . CYSTOSCOPY W/ URETERAL STENT PLACEMENT Bilateral 03/26/2013   Procedure: CYSTOSCOPY WITH BILATERAL RETROGRADE PYELOGRAM/URETERAL STENT PLACEMENT;  Surgeon: Alexis Frock, MD;  Location[facility];  Service: Urology;  Laterality: Bilateral;  . CYSTOSCOPY W/ URETERAL STENT PLACEMENT Left 05/13/2013   Procedure: CYSTOSCOPY WITH RETROGRADE PYELOGRAM/URETERAL STENT PLACEMENT STENT EXCHANGE;  Surgeon: Alexis Frock, MD;  Location: [facility];  Service: Urology;  Laterality: Left;  . PARTIAL MASTECTOMY WITH NEEDLE LOCALIZATION Right 01-14-2003   DCIS  . REMOVAL CYST LEFT HAND  2013  . TOTAL KNEE ARTHROPLASTY Right 03-22-2009  . TOTAL MASTECTOMY Right 02-03-2003   W/ SLN BX  AND  POST 03-01-2003 EVACUATION HEMATOMA  . TOTAL MASTECTOMY Left 05/04/2019   Procedure: LEFT TOTAL MASTECTOMY;  Surgeon: Fanny Skates, MD;  Location: [facility];  Service: General;  Laterality: Left;  . TRANSTHORACIC ECHOCARDIOGRAM  03-25-2009   MILD LVF/ EF 70-80%/ MILD INCREASE SYSTOLIC PULMONARY  PRESSURE  . TRANSURETHRAL RESECTION OF BLADDER TUMOR WITH GYRUS (TURBT-GYRUS) N/A 03/26/2013   Procedure: TRANSURETHRAL RESECTION OF BLADDER TUMOR WITH GYRUS (TURBT-GYRUS);  Surgeon: Alexis Frock, MD;  Location: [facility];  Service: Urology;  Laterality: N/A;  . TRANSURETHRAL RESECTION OF BLADDER TUMOR WITH GYRUS (TURBT-GYRUS) N/A 05/13/2013   Procedure: TRANSURETHRAL RESECTION OF BLADDER TUMOR WITH GYRUS (TURBT-GYRUS)  RE-STAGING TRANSURETHRAL RESECTION OF BLADDER TUMOR, LEFT RETROGRADE PYELOGRAM AND STENT EXCHANGE;  Surgeon: Alexis Frock, MD;  Location: [facility];  Service: Urology;  Laterality: N/A;  . TRANSURETHRAL RESECTION OF BLADDER TUMOR WITH GYRUS (TURBT-GYRUS) N/A 08/11/2014   Procedure: TRANSURETHRAL RESECTION OF BLADDER TUMOR WITH GYRUS (TURBT-GYRUS);  Surgeon:  Alexis Frock, MD;  [facility];  Service: Urology;  Laterality: N/A;  . TUBAL LIGATION      I have reviewed the social history and family history with the patient and they are unchanged from previous note.  ALLERGIES:  is allergic to heparin and morphine and related.  MEDICATIONS:  Current Outpatient Medications  Medication Sig Dispense Refill  . albuterol (VENTOLIN HFA) 108 (90 Base) MCG/ACT inhaler Inhale 2 puffs into the lungs every 6 (six) hours as needed for wheezing or shortness of breath. 8 g 2  . arformoterol (BROVANA) 15 MCG/2ML NEBU Take 2 mLs (15 mcg total) by nebulization 2 (two) times daily. 120 mL 5  . atorvastatin (LIPITOR) 10 MG tablet TAKE ONE TABLET DAILY AT 6PM 90 tablet 3  . Brinzolamide-Brimonidine (SIMBRINZA) 1-0.2 % SUSP Place 1 drop into both eyes in the morning and at bedtime.    . budesonide (PULMICORT) 0.5 MG/2ML nebulizer solution TAKE 14m BY NEBULIZER IN THE MORNING ANDAT BEDTIME 120 mL 1  . digoxin (LANOXIN) 0.125 MG tablet TAKE ONE TABLET DAILY 90 tablet 2  . diltiazem (CARDIZEM CD) 120 MG 24 hr capsule Take 1 capsule (120 mg total) by mouth daily. 30 capsule 11  . exemestane (AROMASIN) 25 MG tablet Take 1 tablet (25 mg total) by mouth daily after breakfast. 30 tablet 1  . fluorometholone (FML) 0.1 % ophthalmic suspension Place 1 drop into both eyes daily.    . furosemide (LASIX) 40 MG tablet Take 1 tablet in  the am and 1/2 tablet in the pm 180 tablet 3  . guaiFENesin (MUCINEX) 600 MG 12 hr tablet Take 600 mg by mouth daily.    Marland Kitchen ipratropium (ATROVENT) 0.02 % nebulizer solution Take 2.5 mLs (0.5 mg total) by nebulization 3 (three) times daily. DX: J44.9 225 mL 5  . latanoprost (XALATAN) 0.005 % ophthalmic solution Place 1 drop into both eyes at bedtime.    . OXYGEN Inhale 2 L into the lungs continuous.     . pantoprazole (PROTONIX) 40 MG tablet TAKE ONE TABLET DAILY 90 tablet 1  . Polyethylene Glycol 3350 (MIRALAX PO) Take 17 g by  mouth daily.    . potassium chloride SA (KLOR-CON) 20 MEQ tablet Take 20 meq by mouth in the morning and 10 meq in the evening    . valACYclovir (VALTREX) 1000 MG tablet Take 1,000 mg by mouth daily.     No current facility-administered medications for this visit.    PHYSICAL EXAMINATION: ECOG PERFORMANCE STATUS: {CHL ONC ECOG PS:[phone]}  There were no vitals filed for this visit. There were no vitals filed for this visit. *** GENERAL:alert, no distress and comfortable SKIN: skin color, texture, turgor are normal, no rashes or significant lesions EYES: normal, Conjunctiva are pink and non-injected, sclera clear {OROPHARYNX:no exudate, no erythema and lips, buccal mucosa, and tongue normal}  NECK: supple, thyroid normal size, non-tender, without nodularity LYMPH:  no palpable lymphadenopathy in the cervical, axillary {or inguinal} LUNGS: clear to auscultation and percussion with normal breathing effort HEART: regular rate & rhythm and no murmurs and no lower extremity edema ABDOMEN:abdomen soft, non-tender and normal bowel sounds Musculoskeletal:no cyanosis of digits and no clubbing  NEURO: alert & oriented x 3 with fluent speech, no focal motor/sensory deficits  LABORATORY DATA:  I have reviewed the data as listed CBC Latest Ref Rng & Units 05/26/2020 12/14/2019 12/10/2019  WBC 4.0 - 10.5 K/uL 8.9 9.0 9.3  Hemoglobin 12.0 - 15.0 g/dL 14.4 13.2 13.5  Hematocrit 36 - 46 % 44.2 43.0 44.1  Platelets 150 - 400 K/uL 235 296 278     CMP Latest Ref Rng & Units 08/09/2020 08/02/2020 07/11/2020  Glucose 65 - 99 mg/dL 87 92 121(H)  BUN 8 - 27 mg/dL _0 Creatinine 0.57 - 1.00 mg/dL 0.91 0.80 0.79  Sodium 134 - 144 mmol/L 140 144 145(H)  Potassium 3.5 - 5.2 mmol/L 4.2 4.5 4.4  Chloride 96 - 106 mmol/L 99 103 104  CO2 20 - 29 mmol/L 26 30(H) 27  Calcium 8.7 - 10.3 mg/dL 9.0 9.2 9.1  Total Protein 6.5 - 8.1 g/dL - - -  Total Bilirubin 0.3 - 1.2 mg/dL - - -  Alkaline Phos 38 - 126  U/L - - -  AST 15 - 41 U/L - - -  ALT 0 - 44 U/L - - -      RADIOGRAPHIC STUDIES: I have personally reviewed the radiological images as listed and agreed with the findings in the report. No results found.   ASSESSMENT & PLAN:  [name] is a 85 y.o. female with   1.Malignant neoplasm of upper-outer quadrant of left breast,lobularcarcinoma, Stage 1A pT2cN0M0, ER/PR+, HER2-,Grade II, chest wall skin recurrence 9/2021ER+/PR+/HER2- -She was diagnosed in 03/2019. She underwent left totalmastectomyby Dr Dalbert Batman on 05/04/19. -Genetictesting was negative for pathogenetic mutations -Oncotype was not recommend due to her advanced age andmedical comorbidities -Givenearly stage disease,post mastectomy radiation is not recommended.  -Patient declined antiestrogen therapy due to age  and medical co-morbidities -She unfortunately developed local skin recurrence from breast cancer in 06/2020. Punch biopsy was done by her dermatologist on 06/17/20.Her 07/08/20 PET showshypermetabolic right supraclavicular nodes, and negative uptake on the left chest wall with no skin recurrence, no other metastasis.  -Plan to proceed with Cerianna PET scan on 08/05/20 to see if herright supraclavicular lymph nodeareER positive, which wouldsupport metastasis from breast cancer,otherwise itis likelyrelated to her recent vaccine injections. -Given her ER positive disease I started her on first line Exemestane 8m once daily starting 06/27/20.She is tolerating well -Due to her advanced age and medical comorbidities, I will hold onCK4/6 inhibitor for now.  -she has developed some back pain on certain position, it may or may not be related to exemestane, pt agree to continue for now. We discussed switching to tamoxifen if back pain gets worse  -Cerianna PET scan is scheduled for 12/3, will see her back on 12/10    2. H/o of right breast DCIS, Dx 2004  -S/p rightlumpectomyby Dr. SFloyde Parkins5/13/2004  then right mastectomy on 02/03/2003, no chemo/RT/AI  3. H/o of Recurrent bladder papillary carcinoma -Initiallydiagnosed on 03/26/13 with high grade papillary urothelial carcinoma -Diagnosedrecurrent low grade papillary urothelial carcinoma on 08/11/14 -underwent transurethral resection of bladder with Gyrus and stent placement.  -She continues to be followed by her Urologist Dr. MTresa Moore  5. H/o of ICH and stroke, S/p craniotomy in 02/2015  6. Comorbidities: HTN, Afib, COPD, Shingles and GERD and arthritis, 40 year h/o smoking.  -Managed by cardiologist Dr. CStanford Breed PCP and Pulmonologist Dr AElsworth Soho  -On 2L O2 canulasince 2019, On albuterol and inhalers. Stable. Continue medications.    PLAN: -will continue exemestane for now, we discussed switching to Tamoxifen if symptoms get worse  -she is scheduled to f/u on 12/10 after Cerianna PET on 12/3    No problem-specific Assessment & Plan notes found for this encounter.   No orders of the defined types were placed in this encounter.  All questions were answered. The patient knows to call the clinic with any problems, questions or concerns. No barriers to learning was detected. The total time spent in the appointment was {CHL ONC TIME VISIT - SBWNJN:[id number]}     AJoslyn Devon12/04/2020   IOneal Deputy am acting as scribe for YTruitt Merle MD.   {Add scribe attestation statement}

## 2020-08-12 ENCOUNTER — Other Ambulatory Visit: Payer: Medicare Other

## 2020-08-12 ENCOUNTER — Ambulatory Visit: Payer: Medicare Other | Admitting: Hematology

## 2020-08-22 ENCOUNTER — Other Ambulatory Visit: Payer: Self-pay | Admitting: Hematology

## 2020-09-06 ENCOUNTER — Other Ambulatory Visit: Payer: Self-pay

## 2020-09-06 ENCOUNTER — Telehealth: Payer: Self-pay | Admitting: Pulmonary Disease

## 2020-09-06 ENCOUNTER — Encounter: Payer: Self-pay | Admitting: Pulmonary Disease

## 2020-09-06 ENCOUNTER — Ambulatory Visit (INDEPENDENT_AMBULATORY_CARE_PROVIDER_SITE_OTHER): Payer: Medicare Other | Admitting: Pulmonary Disease

## 2020-09-06 ENCOUNTER — Ambulatory Visit (INDEPENDENT_AMBULATORY_CARE_PROVIDER_SITE_OTHER): Payer: Medicare Other

## 2020-09-06 DIAGNOSIS — R0602 Shortness of breath: Secondary | ICD-10-CM

## 2020-09-06 DIAGNOSIS — J449 Chronic obstructive pulmonary disease, unspecified: Secondary | ICD-10-CM

## 2020-09-06 DIAGNOSIS — C50412 Malignant neoplasm of upper-outer quadrant of left female breast: Secondary | ICD-10-CM

## 2020-09-06 DIAGNOSIS — I5033 Acute on chronic diastolic (congestive) heart failure: Secondary | ICD-10-CM | POA: Diagnosis not present

## 2020-09-06 DIAGNOSIS — I509 Heart failure, unspecified: Secondary | ICD-10-CM | POA: Diagnosis not present

## 2020-09-06 DIAGNOSIS — Z170 Estrogen receptor positive status [ER+]: Secondary | ICD-10-CM

## 2020-09-06 DIAGNOSIS — J432 Centrilobular emphysema: Secondary | ICD-10-CM | POA: Diagnosis not present

## 2020-09-06 DIAGNOSIS — J9811 Atelectasis: Secondary | ICD-10-CM | POA: Diagnosis not present

## 2020-09-06 DIAGNOSIS — J811 Chronic pulmonary edema: Secondary | ICD-10-CM | POA: Diagnosis not present

## 2020-09-06 DIAGNOSIS — I517 Cardiomegaly: Secondary | ICD-10-CM | POA: Diagnosis not present

## 2020-09-06 DIAGNOSIS — J9611 Chronic respiratory failure with hypoxia: Secondary | ICD-10-CM | POA: Diagnosis not present

## 2020-09-06 DIAGNOSIS — J90 Pleural effusion, not elsewhere classified: Secondary | ICD-10-CM

## 2020-09-06 DIAGNOSIS — I10 Essential (primary) hypertension: Secondary | ICD-10-CM | POA: Diagnosis not present

## 2020-09-06 LAB — PULMONARY FUNCTION TEST
DL/VA % pred: 33 %
DL/VA: 1.35 ml/min/mmHg/L
DLCO cor % pred: 22 %
DLCO cor: 4.36 ml/min/mmHg
DLCO unc % pred: 22 %
DLCO unc: 4.36 ml/min/mmHg
FEF 25-75 Post: 0.4 L/sec
FEF 25-75 Pre: 0.43 L/sec
FEF2575-%Change-Post: -6 %
FEF2575-%Pred-Post: 30 %
FEF2575-%Pred-Pre: 32 %
FEV1-%Change-Post: -1 %
FEV1-%Pred-Post: 51 %
FEV1-%Pred-Pre: 52 %
FEV1-Post: 1 L
FEV1-Pre: 1.02 L
FEV1FVC-%Change-Post: -2 %
FEV1FVC-%Pred-Pre: 67 %
FEV6-%Change-Post: -1 %
FEV6-%Pred-Post: 80 %
FEV6-%Pred-Pre: 81 %
FEV6-Post: 1.97 L
FEV6-Pre: 2 L
FEV6FVC-%Change-Post: -2 %
FEV6FVC-%Pred-Post: 100 %
FEV6FVC-%Pred-Pre: 103 %
FVC-%Change-Post: 1 %
FVC-%Pred-Post: 80 %
FVC-%Pred-Pre: 79 %
FVC-Post: 2.08 L
FVC-Pre: 2.06 L
Post FEV1/FVC ratio: 48 %
Post FEV6/FVC ratio: 95 %
Pre FEV1/FVC ratio: 50 %
Pre FEV6/FVC Ratio: 97 %

## 2020-09-06 NOTE — Assessment & Plan Note (Signed)
PET scan seems to suggest recurrence and mediastinum.  I cannot feel supraclavicular lymph nodes.  If biopsy is desired, she would be at high risk for EBUS would like her heart failure to be treated before putting her through procedure.  She is not very keen on undertaking the risk of general anesthesia

## 2020-09-06 NOTE — Assessment & Plan Note (Signed)
Today's chest x-ray independently reviewed which shows effusion and interstitial prominence concerning for overt heart failure. We will increase her Lasix Take 2 tablets of Lasix in the morning/80 mg and 1 in the afternoon for next 2 days Then back down to 1 tablet twice daily

## 2020-09-06 NOTE — Progress Notes (Signed)
Subjective:    Patient ID: [name], female    DOB: [birth year], 86 y.o.   MRN: [id number]  HPI  26 yoremote smoker for follow-up of COPD chronic respiratory failure on home O2 She smoked 40 pack years before she quit in 2004  PMH -atrial fibrillation not on anticoagulation due to prior history of intracranial hemorrhagein 2016. Bladder CA , Breast CA s/p lt total mastectomy Exacerbation 12/21/2017 and 08/22/2018   Televisit 10/13/2019 reviewed-given Omnicef and prednisone Accompanied by her son  She is in a wheelchair.  She has downsized and moved into a assisted living at [place] , husband has Parkinson's, retired Chief Executive Officer.,  Was diagnosed with AML  I note cardiology assessment for heart failure, takes Lasix 40 twice daily. Still has fluid buildup. Weight has gone down from 153 pounds a year ago to 137 pounds so difficult to follow. Chest x-ray shows right pleural effusion and bilateral interstitial prominence Also reviewed recent PET scan which showed uptake in right supraclavicular lymph node and subcarinal region, oncology follow-up pending  She complains of itching all over  Labs BUN/creatinine 12/202021 was 17/0.9  I have reviewed cardiology and oncology consultation  Significant tests/ events reviewed   PFTs 09/2020 ratio 50, FEV1 52%/1.02, no bronchodilator response, FVC 79%, DLCO 22%/1.36 Spirometry6/2019ratio 48, FEV1 37% and FVC 57% consistent with severe obstruction    Review of Systems neg for any significant sore throat, dysphagia, itching, sneezing, nasal congestion or excess/ purulent secretions, fever, chills, sweats, unintended wt loss, pleuritic or exertional cp, hempoptysis, orthopnea pnd or change in chronic leg swelling. Also denies presyncope, palpitations, heartburn, abdominal pain, nausea, vomiting, diarrhea or change in bowel or urinary habits, dysuria,hematuria, rash, arthralgias, visual complaints, headache, numbness weakness or  ataxia.     Objective:   Physical Exam   Gen. Pleasant, well-nourished,elderly,  in no distress ENT - no thrush, no pallor/icterus,no post nasal drip Neck: No JVD, no thyromegaly, no carotid bruits Lungs: no use of accessory muscles, no dullness to percussion, decreased bilateral without rales or rhonchi  Cardiovascular: Rhythm regular, heart sounds  normal, no murmurs or gallops, 1+ peripheral edema Musculoskeletal: No deformities, no cyanosis or clubbing         Assessment & Plan:

## 2020-09-06 NOTE — Assessment & Plan Note (Signed)
Continue 3 L oxygen continuous at all times

## 2020-09-06 NOTE — Telephone Encounter (Signed)
09/06/2020  We will forward to Dr. Elsworth Soho who saw the patient today in clinic.  Wyn Quaker, FNP

## 2020-09-06 NOTE — Progress Notes (Deleted)
HPI: FU atrial fibrillationand CHF. Patient was admitted in June 2016 with a large right cerebellar hemorrhage. Note her only anticoagulate at that time was aspirin. The hemorrhage required surgical evacuation. Patient developed atrial fibrillation during the hospitalization with difficult to control heart rate. She was felt not to be a candidate for anticoagulation given intracranial hemorrhage. Holter 7/16 showed atrial fibrillation with elevated rate; digoxin added. Ipreviously contacted Dr Kathyrn Sheriff Neurosurgery to see if she would be a candidate for short-term anticoagulation which would then qualify her for a watchman device. He felt this could be pursued.However she declined watchman. Patient admitted April 2021 with CHF. Echocardiogram April 2021 showed vigorous LV systolic function, mild RV dysfunction, severe biatrial enlargement, moderate pulmonary hypertension.  Chest x-ray November 2021 showed small right pleural effusion.  Since last seen,   Current Outpatient Medications  Medication Sig Dispense Refill  . albuterol (VENTOLIN HFA) 108 (90 Base) MCG/ACT inhaler Inhale 2 puffs into the lungs every 6 (six) hours as needed for wheezing or shortness of breath. 8 g 2  . arformoterol (BROVANA) 15 MCG/2ML NEBU Take 2 mLs (15 mcg total) by nebulization 2 (two) times daily. 120 mL 5  . atorvastatin (LIPITOR) 10 MG tablet TAKE ONE TABLET DAILY AT 6PM 90 tablet 3  . Brinzolamide-Brimonidine (SIMBRINZA) 1-0.2 % SUSP Place 1 drop into both eyes in the morning and at bedtime.    . budesonide (PULMICORT) 0.5 MG/2ML nebulizer solution TAKE 71m BY NEBULIZER IN THE MORNING ANDAT BEDTIME 120 mL 1  . digoxin (LANOXIN) 0.125 MG tablet TAKE ONE TABLET DAILY 90 tablet 2  . diltiazem (CARDIZEM CD) 120 MG 24 hr capsule Take 1 capsule (120 mg total) by mouth daily. 30 capsule 11  . exemestane (AROMASIN) 25 MG tablet TAKE ONE TABLET DAILY AFTER BREAKFAST 30 tablet 6  . fluorometholone (FML) 0.1 %  ophthalmic suspension Place 1 drop into both eyes daily.    . furosemide (LASIX) 40 MG tablet Take 1 tablet in the am and 1/2 tablet in the pm 180 tablet 3  . guaiFENesin (MUCINEX) 600 MG 12 hr tablet Take 600 mg by mouth daily.    .Marland Kitchenipratropium (ATROVENT) 0.02 % nebulizer solution Take 2.5 mLs (0.5 mg total) by nebulization 3 (three) times daily. DX: J44.9 225 mL 5  . latanoprost (XALATAN) 0.005 % ophthalmic solution Place 1 drop into both eyes at bedtime.    . OXYGEN Inhale 2 L into the lungs continuous.     . pantoprazole (PROTONIX) 40 MG tablet TAKE ONE TABLET DAILY 90 tablet 1  . Polyethylene Glycol 3350 (MIRALAX PO) Take 17 g by mouth daily.    . potassium chloride SA (KLOR-CON) 20 MEQ tablet Take 20 meq by mouth in the morning and 10 meq in the evening    . valACYclovir (VALTREX) 1000 MG tablet Take 1,000 mg by mouth daily.     No current facility-administered medications for this visit.     Past Medical History:  Diagnosis Date  . Arthritis   . COPD (chronic obstructive pulmonary disease) ([place]   . Dyspnea on exertion   . GERD (gastroesophageal reflux disease)   . Glaucoma of both eyes   . History of atrial fibrillation without current medication 2010   POST SURG 2010--  PER PT NO ISSUES SINCE  . History of breast cancer    DX DCIS IN 2004--  S/P RIGHT MASTECTOMY , NO CHEMORADIATION---  NO RECURRENCE  . History of CHF (congestive heart failure)  POST SURG 2010  . History of hypertension   . History of shingles    02/ 2015--  back of neck and left flank--  no residual pain  . ICH (intracerebral hemorrhage) ([place]) 02/05/2015  . Nephrolithiasis   . Recurrent bladder papillary carcinoma ([place]) first dx 06/ 2014   s/p  turbt's  and instillation mitomycin c (chemo)//dx again in 2015-different type  . Stroke [facility])     Past Surgical History:  Procedure Laterality Date  . CATARACT EXTRACTION W/ INTRAOCULAR LENS  IMPLANT, BILATERAL    . CRANIECTOMY N/A 02/05/2015   Procedure:  SUBOCCIPITAL CRANIECTOMY EVACUATION OF HEMATOMA;  Surgeon: Consuella Lose, MD;  Location: [place] [facility];  Service: Neurosurgery;  Laterality: N/A;  . CYSTOSCOPY W/ RETROGRADES Bilateral 08/11/2014   Procedure: CYSTOSCOPY WITH BILATERAL RETROGRADE PYELOGRAM AND MITOMYCIN INSTILLATION;  Surgeon: Alexis Frock, MD;  Location[facility];  Service: Urology;  Laterality: Bilateral;  . CYSTOSCOPY W/ URETERAL STENT PLACEMENT Bilateral 03/26/2013   Procedure: CYSTOSCOPY WITH BILATERAL RETROGRADE PYELOGRAM/URETERAL STENT PLACEMENT;  Surgeon: Alexis Frock, MD;  Location[facility];  Service: Urology;  Laterality: Bilateral;  . CYSTOSCOPY W/ URETERAL STENT PLACEMENT Left 05/13/2013   Procedure: CYSTOSCOPY WITH RETROGRADE PYELOGRAM/URETERAL STENT PLACEMENT STENT EXCHANGE;  Surgeon: Alexis Frock, MD;  Location: [facility];  Service: Urology;  Laterality: Left;  . PARTIAL MASTECTOMY WITH NEEDLE LOCALIZATION Right 01-14-2003   DCIS  . REMOVAL CYST LEFT HAND  2013  . TOTAL KNEE ARTHROPLASTY Right 03-22-2009  . TOTAL MASTECTOMY Right 02-03-2003   W/ SLN BX  AND  POST 03-01-2003 EVACUATION HEMATOMA  . TOTAL MASTECTOMY Left 05/04/2019   Procedure: LEFT TOTAL MASTECTOMY;  Surgeon: Fanny Skates, MD;  Location: [place];  Service: General;  Laterality: Left;  . TRANSTHORACIC ECHOCARDIOGRAM  03-25-2009   MILD LVF/ EF 70-80%/ MILD INCREASE SYSTOLIC PULMONARY  PRESSURE  . TRANSURETHRAL RESECTION OF BLADDER TUMOR WITH GYRUS (TURBT-GYRUS) N/A 03/26/2013   Procedure: TRANSURETHRAL RESECTION OF BLADDER TUMOR WITH GYRUS (TURBT-GYRUS);  Surgeon: Alexis Frock, MD;  Location[facility];  Service: Urology;  Laterality: N/A;  . TRANSURETHRAL RESECTION OF BLADDER TUMOR WITH GYRUS (TURBT-GYRUS) N/A 05/13/2013   Procedure: TRANSURETHRAL RESECTION OF BLADDER TUMOR WITH GYRUS (TURBT-GYRUS)  RE-STAGING TRANSURETHRAL RESECTION OF BLADDER TUMOR, LEFT RETROGRADE PYELOGRAM  AND STENT EXCHANGE;  Surgeon: Alexis Frock, MD;  Location: [facility];  Service: Urology;  Laterality: N/A;  . TRANSURETHRAL RESECTION OF BLADDER TUMOR WITH GYRUS (TURBT-GYRUS) N/A 08/11/2014   Procedure: TRANSURETHRAL RESECTION OF BLADDER TUMOR WITH GYRUS (TURBT-GYRUS);  Surgeon: Alexis Frock, MD;  Location: [facility]  Service: Urology;  Laterality: N/A;  . TUBAL LIGATION      Social History   Socioeconomic History  . Marital status: Married    Spouse name: Not on file  . Number of children: 4  . Years of education: Not on file  . Highest education level: Not on file  Occupational History  . Not on file  Tobacco Use  . Smoking status: Former Smoker    Packs/day: 1.00    Years: 40.00    Pack years: 40.00    Types: Cigarettes    Quit date: 03/20/2003    Years since quitting: 17.4  . Smokeless tobacco: Never Used  Vaping Use  . Vaping Use: Never used  Substance and Sexual Activity  . Alcohol use: No    Alcohol/week: 0.0 standard drinks  . Drug use: No  . Sexual activity: Not Currently  Partners: Male    Birth control/protection: Surgical    Comment: BTL  Other Topics Concern  . Not on file  Social History Narrative  . Not on file   Social Determinants of Health   Financial Resource Strain: Not on file  Food Insecurity: Not on file  Transportation Needs: Not on file  Physical Activity: Not on file  Stress: Not on file  Social Connections: Not on file  Intimate Partner Violence: Not on file    Family History  Problem Relation Age of Onset  . Colon cancer Mother 76  . Colon cancer Maternal Grandmother   . Stroke Maternal Grandmother   . Breast cancer Maternal Aunt 37  . Cancer Maternal Aunt        ? ovarian  . Breast cancer Other 60       breast cancer, ovarian cancer  . Breast cancer Daughter 67  . Stroke Paternal Grandmother   . Prostate cancer Paternal Grandfather   . Heart attack Neg Hx   . Hypertension Neg Hx      ROS: no fevers or chills, productive cough, hemoptysis, dysphasia, odynophagia, melena, hematochezia, dysuria, hematuria, rash, seizure activity, orthopnea, PND, pedal edema, claudication. Remaining systems are negative.  Physical Exam: Well-developed well-nourished in no acute distress.  Skin is warm and dry.  HEENT is normal.  Neck is supple.  Chest is clear to auscultation with normal expansion.  Cardiovascular exam is regular rate and rhythm.  Abdominal exam nontender or distended. No masses palpated. Extremities show no edema. neuro grossly intact  ECG- personally reviewed  A/P  1 permanent atrial fibrillation-plan to continue Cardizem and digoxin.  She is not on anticoagulation as she had a prior intracranial hemorrhage on aspirin alone.  She declined short-term anticoagulation/watchman in the past.  2 dyspnea-this is felt to be multifactorial including significant COPD as well as diastolic congestive heart failure.  Continue Lasix 40 mg twice daily.  Check potassium and renal function.  3 hypertension-patient's blood pressure has been borderline at home.  Continue to follow.  4 hyperlipidemia-continue statin.  5 COPD-per pulmonary.  6 recurrent breast cancer-Per oncology.  Kirk Ruths, MD

## 2020-09-06 NOTE — Assessment & Plan Note (Signed)
Related to diastolic heart failure rather than malignant effusion, will follow-up chest x-ray next visit for resolution

## 2020-09-06 NOTE — Patient Instructions (Signed)
No change to medications for COPD  Take 2 tablets of Lasix in the morning/80 mg and 1 in the afternoon for next 2 days Then back down to 1 tablet twice daily  Discuss about biopsy with Dr. Burr Medico Discussed whether Aromasin could be causing your itching

## 2020-09-06 NOTE — Progress Notes (Signed)
Spirometry pre and post and Dlco done today. ?

## 2020-09-06 NOTE — Telephone Encounter (Signed)
Received call report from [place] with [facility] on patient's Chest Xray done on 09/06/20. Aaron Edelman  please review the result/impression copied below:  IMPRESSION: Cardiomegaly with pulmonary vascular congestion. Mild interstitial edema with a persistent small right pleural effusion. Right base atelectasis. The appearance raises concern for a degree of underlying congestive heart failure.  Aortic Atherosclerosis (ICD10-I70.0).  These results will be called to the ordering clinician or representative by the Radiologist Assistant, and communication documented in the PACS or [employer].   Please advise, thank you.

## 2020-09-06 NOTE — Assessment & Plan Note (Signed)
Continue Pulmicort and Brovana nebs. Use Atrovent 3 times daily

## 2020-09-07 NOTE — Progress Notes (Signed)
[place]   [phone] [phone]   Clinic Follow up Note   Patient Care Team: Janith Lima, MD as PCP - General (Internal Medicine) Stanford Breed, Denice Bors, MD as PCP - Cardiology (Cardiology) Rockwell Germany, RN as Oncology Nurse Navigator Mauro Kaufmann, RN as Oncology Nurse Navigator Truitt Merle, MD as Consulting Physician (Hematology) Fanny Skates, MD as Consulting Physician (General Surgery) Kyung Rudd, MD as Consulting Physician (Radiation Oncology) Alexis Frock, MD as Consulting Physician (Anesthesiology) Alexis Frock, MD as Consulting Physician (Urology) [name], MD as Consulting Physician (Cardiology) Rigoberto Noel, MD as Consulting Physician (Pulmonary Disease)  Date of Service:  09/09/2020  CHIEF COMPLAINT: f/u of left breast cancer  SUMMARY OF ONCOLOGIC HISTORY: Oncology History Overview Note  Cancer Staging Malignant neoplasm of upper-outer quadrant of left breast in female, estrogen receptor positive ([place]) Staging form: Breast, AJCC 8th Edition - Clinical stage from 04/01/2019: Stage IA (cT1c, cN0, cM0, G2, ER+, PR+, HER2-) - Signed by Truitt Merle, MD on 04/07/2019 - Pathologic stage from 05/04/2019: Stage Unknown (pT2, pNX, cM0, G2, ER+, PR+, HER2-) - Signed by Truitt Merle, MD on 05/27/2019    Malignant neoplasm of upper-outer quadrant of left breast in female, estrogen receptor positive ([place])  03/30/2019 Mammogram   Left diagnostic mammogram and [place] 03/30/19  IMPRESSION The 1.6cm heterogenous focal area in the left breast at 1:00-2:00 4-6cm form the nipple, corresponding to the hard palpable area in the left breast is suspicious of malignancy, particularly lobular carcinoma.     04/01/2019 Cancer Staging   Staging form: Breast, AJCC 8th Edition - Clinical stage from 04/01/2019: Stage IA (cT1c, cN0, cM0, G2, ER+, PR+, HER2-) - Signed by Truitt Merle, MD on 04/07/2019   04/01/2019 Initial Biopsy   Diagnosis 04/01/19 Breast,  left, needle core biopsy, 2 o'clock, 5cmfn - INVASIVE MAMMARY CARCINOMA, SEE COMMENT. - MAMMARY CARCINOMA IN SITU.   04/01/2019 Receptors her2   The tumor cells are NEGATIVE for Her2 (1+). Estrogen Receptor: 100%, POSITIVE, STRONG STAINING INTENSITY Progesterone Receptor: 30%, POSITIVE, STRONG STAINING INTENSITY Proliferation Marker Ki67: 20%   04/03/2019 Initial Diagnosis   Malignant neoplasm of upper-outer quadrant of left breast in female, estrogen receptor positive (HCC)    Genetic Testing   Negative genetic testing. No pathogenic variants identified on the [facility] Breast Cancer STAT Panel + Common Hereditary Cancers Panel. The STAT Breast cancer panel offered by [facility] includes sequencing and rearrangement analysis for the following 9 genes:  ATM, BRCA1, BRCA2, CDH1, CHEK2, PALB2, PTEN, STK11 and TP53.  The Common Hereditary Cancers Panel offered by [facility] includes sequencing and/or deletion duplication testing of the following 47 genes: APC, ATM, AXIN2, BARD1, BMPR1A, BRCA1, BRCA2, BRIP1, CDH1, CDKN2A (p14ARF), CDKN2A (p16INK4a), CKD4, CHEK2, CTNNA1, DICER1, EPCAM (Deletion/duplication testing only), GREM1 (promoter region deletion/duplication testing only), KIT, MEN1, MLH1, MSH2, MSH3, MSH6, MUTYH, NBN, NF1, NHTL1, PALB2, PDGFRA, PMS2, POLD1, POLE, PTEN, RAD50, RAD51C, RAD51D,  SDHB, SDHC, SDHD, SMAD4, SMARCA4. STK11, TP53, TSC1, TSC2, and VHL.  The following genes were evaluated for sequence changes only: SDHA and HOXB13 c.251G>A variant only. The report date is 04/17/2019.    05/04/2019 Surgery    LEFT TOTAL MASTECTOMY by Dr. Dalbert Batman 05/04/19   05/04/2019 Pathology Results   Diagnosis 05/04/19 1. Breast, excision - INVASIVE LOBULAR CARCINOMA, NOTTINGHAM GRADE 2 - SEE COMMENT 2. Breast, simple mastectomy, Left total - MULTIFOCAL, INVASIVE LOBULAR CARCINOMA, NOTTINGHAM GRADE 2 OF 3, 2.1 CM - MARGINS UNINVOLVED BY CARCINOMA (0.2 CM; POSTERIOR MARGIN) -  LYMPHOVASCULAR SPACE INVASION  PRESENT - PREVIOUS BIOPSY SITE CHANGES PRESENT - SEBORRHEIC KERATOSIS - SEE ONCOLOGY TABLE AND COMMENT BELOW   05/04/2019 Cancer Staging   Staging form: Breast, AJCC 8th Edition - Pathologic stage from 05/04/2019: Stage Unknown (pT2, pNX, cM0, G2, ER+, PR+, HER2-) - Signed by Truitt Merle, MD on 05/27/2019   05/31/2020 Breast US   Left Breast US 05/31/20 Multiple, raised, hard, pink lesions in the skin of th left chest surrounding the mastectomy scar. There is one inferior and lateral to the scar which has a corresponding 1.1cm vascular mass in teh skin at [place]. Dermatology consult for skin biopsy of the lesion is recommended to exclude recurrent breast cancer to the skin.    06/17/2020 Relapse/Recurrence   PUNCH BIOPSY by Dermatologist   Diagnosis 1. Skin Biopsy-(P), A) right inferior lateral antecubital shave & ED&C SEBORRHEIC KERATOSIS, IRRITATED  2. Skin Biopsy-(P), B) left inferior central chest punch  POORLY DIFFERENTIATED ADENOCARCINOMA CONSISTENT WITH BREAST ORIGIN INVOLVING THE DERMIS, SEE DESCRIPTION   HER2 NEGATIVE  ER Positive >95% PR NEGATIVE     06/27/2020 -  Anti-estrogen oral therapy   First-line Exemestane 49m once daily starting 06/27/20   07/08/2020 PET scan   IMPRESSION: 1. No metabolic activity associated with nodularity along the mastectomy scar in the LEFT chest wall. 2. Two hypermetabolic RIGHT supraclavicular lymph nodes. Hypermetabolic activity may relate to RIGHT arm COVID vaccination of 10/ 03/2020. Estradiol FDG PET scan may differentiate versus attention on follow-up versus biopsy. 3. No evidence distant metastatic disease. 4. Moderate RIGHT pleural effusion and basilar atelectasis.   08/05/2020 PET scan   IMPRESSION: 1. No estradiol receptor radiotracer accumulation within the RIGHT supraclavicular lymph nodes which were hypermetabolic on comparison FDG PET scan. Favor activity related to COVID vaccination. 2. Linear band cutaneous radiotracer  activity within the LEFT chest wall presumably relates to estrogen positive punch biopsies performed by dermatologist. 3. No clear evidence of metastatic estrogen positive breast cancer on the remaining whole-body scan. 4. Intense radiotracer activity within the mediastinum and thoracic inlet without clear corresponding CT findings. Favor benign physiologic activity. 5. Intense radiotracer activity associated with LEFT RIGHT lower lobe atelectasis is also favored benign physiologic activity. 6. Stable bilateral pleural effusions.   ADDENDUM: Although there is scant lymphoid tissue and small nodes associated with the intense mediastinal F 18 estradiol activity, upon conferring with MD vendor expert, concern exists for invasive lobular carcinoma to the mediastinum.   08/05/2020 PET scan   [place] PET IMPRESSION: 1. No estradiol receptor radiotracer accumulation within the RIGHT supraclavicular lymph nodes which were hypermetabolic on comparison FDG PET scan. Favor activity related to COVID vaccination. 2. Linear band cutaneous radiotracer activity within the LEFT chest wall presumably relates to estrogen positive punch biopsies performed by dermatologist. 3. No clear evidence of metastatic estrogen positive breast cancer on the remaining whole-body scan. 4. Intense radiotracer activity within the mediastinum and thoracic inlet without clear corresponding CT findings. Favor benign physiologic activity. 5. Intense radiotracer activity associated with LEFT RIGHT lower lobe atelectasis is also favored benign physiologic activity. 6. Stable bilateral pleural effusions.     ADDENDUM: Although there is scant lymphoid tissue and small nodes associated with the intense mediastinal F 18 estradiol activity, upon conferring with MD vendor expert, concern exists for invasive lobular carcinoma to the mediastinum.   Recommend sampling either the small RIGHT thoracic inlet  lymph nodes/supraclavicular nodes or potentially bronchoscopy with sampling of the subcarinal intense reactive lymphoid tissue.  CURRENT THERAPY:  First-line Exemestane 39m once daily starting 06/25/20  INTERVAL HISTORY:  Susan ROSENis here for a follow up. She presents to the clinic by her son. She notes she is still on exemestane. She notes she has skin itching without rash. She is itching all over her body. She notes this wakes her up. She has blood spots when scratching. This started in the last 2 weeks, almost 3 months after starting exemestane.  She still has left mid back pain which is mild and manageable. She was seen by Dr AEartha Inchlast week. She notes she had thoracentesis in the past with 6L removed which was related to her heart and lungs.     REVIEW OF SYSTEMS:   Constitutional: Denies fevers, chills or abnormal weight loss Eyes: Denies blurriness of vision Ears, nose, mouth, throat, and face: Denies mucositis or sore throat Respiratory: Denies cough, dyspnea or wheezes Cardiovascular: Denies palpitation, chest discomfort or lower extremity swelling Gastrointestinal:  Denies nausea, heartburn or change in bowel habits Skin: Denies abnormal skin rashes (+) Diffuse skin itching, without rash  Lymphatics: Denies new lymphadenopathy or easy bruising Neurological:Denies numbness, tingling or new weaknesses Behavioral/Psych: Mood is stable, no new changes  All other systems were reviewed with the patient and are negative.  MEDICAL HISTORY:  Past Medical History:  Diagnosis Date  . Arthritis   . COPD (chronic obstructive pulmonary disease) ([place]   . Dyspnea on exertion   . GERD (gastroesophageal reflux disease)   . Glaucoma of both eyes   . History of atrial fibrillation without current medication 2010   POST SURG 2010--  PER PT NO ISSUES SINCE  . History of breast cancer    DX DCIS IN 2004--  S/P RIGHT MASTECTOMY , NO CHEMORADIATION---  NO RECURRENCE  . History  of CHF (congestive heart failure)    POST SURG 2010  . History of hypertension   . History of shingles    02/ 2015--  back of neck and left flank--  no residual pain  . [place]  . Nephrolithiasis   . Recurrent bladder papillary carcinoma ([place] 06/ 2014   s/p  turbt's  and instillation mitomycin c (chemo)//dx again in 2015-different type  . Stroke ([facility]     SURGICAL HISTORY: Past Surgical History:  Procedure Laterality Date  . CATARACT EXTRACTION W/ INTRAOCULAR LENS  IMPLANT, BILATERAL    . CRANIECTOMY N/A 02/05/2015   Procedure: SUBOCCIPITAL CRANIECTOMY EVACUATION OF HEMATOMA;  Surgeon: NConsuella Lose MD;  [facility];  Service: Neurosurgery;  Laterality: N/A;  . CYSTOSCOPY W/ RETROGRADES Bilateral 08/11/2014   Procedure: CYSTOSCOPY WITH BILATERAL RETROGRADE PYELOGRAM AND MITOMYCIN INSTILLATION;  Surgeon: TAlexis Frock MD;  [facility]  Service: Urology;  Laterality: Bilateral;  . CYSTOSCOPY W/ URETERAL STENT PLACEMENT Bilateral 03/26/2013   Procedure: CYSTOSCOPY WITH BILATERAL RETROGRADE PYELOGRAM/URETERAL STENT PLACEMENT;  Surgeon: TAlexis Frock MD;  [facility]  Service: Urology;  Laterality: Bilateral;  . CYSTOSCOPY W/ URETERAL STENT PLACEMENT Left 05/13/2013   Procedure: CYSTOSCOPY WITH RETROGRADE PYELOGRAM/URETERAL STENT PLACEMENT STENT EXCHANGE;  Surgeon: TAlexis Frock MD;  [facility]  Service: Urology;  Laterality: Left;  . PARTIAL MASTECTOMY WITH NEEDLE LOCALIZATION Right 01-14-2003   DCIS  . REMOVAL CYST LEFT HAND  2013  . TOTAL KNEE ARTHROPLASTY Right 03-22-2009  . TOTAL MASTECTOMY Right 02-03-2003   W/ SLN BX  AND  POST 03-01-2003 EVACUATION HEMATOMA  . TOTAL  MASTECTOMY Left 05/04/2019   Procedure: LEFT TOTAL MASTECTOMY;  Surgeon: Fanny Skates, MD;  Location: [facility];  Service: General;  Laterality: Left;  . TRANSTHORACIC  ECHOCARDIOGRAM  03-25-2009   MILD LVF/ EF 70-80%/ MILD INCREASE SYSTOLIC PULMONARY  PRESSURE  . TRANSURETHRAL RESECTION OF BLADDER TUMOR WITH GYRUS (TURBT-GYRUS) N/A 03/26/2013   Procedure: TRANSURETHRAL RESECTION OF BLADDER TUMOR WITH GYRUS (TURBT-GYRUS);  Surgeon: Alexis Frock, MD;  Location[facility];  Service: Urology;  Laterality: N/A;  . TRANSURETHRAL RESECTION OF BLADDER TUMOR WITH GYRUS (TURBT-GYRUS) N/A 05/13/2013   Procedure: TRANSURETHRAL RESECTION OF BLADDER TUMOR WITH GYRUS (TURBT-GYRUS)  RE-STAGING TRANSURETHRAL RESECTION OF BLADDER TUMOR, LEFT RETROGRADE PYELOGRAM AND STENT EXCHANGE;  Surgeon: Alexis Frock, MD;  Location: [facility];  Service: Urology;  Laterality: N/A;  . TRANSURETHRAL RESECTION OF BLADDER TUMOR WITH GYRUS (TURBT-GYRUS) N/A 08/11/2014   Procedure: TRANSURETHRAL RESECTION OF BLADDER TUMOR WITH GYRUS (TURBT-GYRUS);  Surgeon: Alexis Frock, MD;  Location: [facility];  Service: Urology;  Laterality: N/A;  . TUBAL LIGATION      I have reviewed the social history and family history with the patient and they are unchanged from previous note.  ALLERGIES:  is allergic to heparin and morphine and related.  MEDICATIONS:  Current Outpatient Medications  Medication Sig Dispense Refill  . anastrozole (ARIMIDEX) 1 MG tablet Take 1 tablet (1 mg total) by mouth daily. 30 tablet 1  . albuterol (VENTOLIN HFA) 108 (90 Base) MCG/ACT inhaler Inhale 2 puffs into the lungs every 6 (six) hours as needed for wheezing or shortness of breath. 8 g 2  . arformoterol (BROVANA) 15 MCG/2ML NEBU Take 2 mLs (15 mcg total) by nebulization 2 (two) times daily. 120 mL 5  . atorvastatin (LIPITOR) 10 MG tablet TAKE ONE TABLET DAILY AT 6PM 90 tablet 3  . Brinzolamide-Brimonidine (SIMBRINZA) 1-0.2 % SUSP Place 1 drop into both eyes in the morning and at bedtime.    . budesonide (PULMICORT) 0.5 MG/2ML nebulizer solution TAKE 48m BY NEBULIZER IN THE  MORNING ANDAT BEDTIME 120 mL 1  . digoxin (LANOXIN) 0.125 MG tablet TAKE ONE TABLET DAILY 90 tablet 2  . diltiazem (CARDIZEM CD) 120 MG 24 hr capsule Take 1 capsule (120 mg total) by mouth daily. 30 capsule 11  . exemestane (AROMASIN) 25 MG tablet TAKE ONE TABLET DAILY AFTER BREAKFAST 30 tablet 6  . fluorometholone (FML) 0.1 % ophthalmic suspension Place 1 drop into both eyes daily.    . furosemide (LASIX) 40 MG tablet Take 1 tablet in the am and 1/2 tablet in the pm 180 tablet 3  . guaiFENesin (MUCINEX) 600 MG 12 hr tablet Take 600 mg by mouth daily.    .Marland Kitchenipratropium (ATROVENT) 0.02 % nebulizer solution Take 2.5 mLs (0.5 mg total) by nebulization 3 (three) times daily. DX: J44.9 225 mL 5  . latanoprost (XALATAN) 0.005 % ophthalmic solution Place 1 drop into both eyes at bedtime.    . OXYGEN Inhale 2 L into the lungs continuous.     . pantoprazole (PROTONIX) 40 MG tablet TAKE ONE TABLET DAILY 90 tablet 1  . Polyethylene Glycol 3350 (MIRALAX PO) Take 17 g by mouth daily.    . potassium chloride SA (KLOR-CON) 20 MEQ tablet Take 20 meq by mouth in the morning and 10 meq in the evening    . valACYclovir (VALTREX) 1000 MG tablet Take 1,000 mg by mouth daily.     No current facility-administered medications for this visit.  PHYSICAL EXAMINATION: ECOG PERFORMANCE STATUS: 3 - Symptomatic, >50% confined to bed  Vitals:   09/09/20 1435  BP: 98/67  Pulse: 63  Resp: 16  Temp: (!) 97 F (36.1 C)  SpO2: (!) 86%   Filed Weights   09/09/20 1435  Weight: 134 lb 12.8 oz (61.1 kg)    GENERAL:alert, no distress and comfortable SKIN: skin color, texture, turgor are normal, no rashes or significant lesions (+) spots from scratching on left mid back and left knee  EYES: normal, Conjunctiva are pink and non-injected, sclera clear  NECK: supple, thyroid normal size, non-tender, without nodularity LYMPH:  no palpable lymphadenopathy in the cervical, axillary  LUNGS: clear to auscultation and  percussion (+) decreased breath sounds on right lung HEART: regular rate & rhythm and no murmurs and no lower extremity edema ABDOMEN:abdomen soft, non-tender and normal bowel sounds Musculoskeletal:no cyanosis of digits and no clubbing  NEURO: alert & oriented x 3 with fluent speech, no focal motor/sensory deficits BREAST: S/p bilateral mastectomy with surgical incision healed well. (+) pathy pinky skin rashes/soft tissue nodularity along mastectomy incision line and lower chest wall, now much improved without mininum skin erythema.    LABORATORY DATA:  I have reviewed the data as listed CBC Latest Ref Rng & Units 09/09/2020 05/26/2020 12/14/2019  WBC 4.0 - 10.5 K/uL 8.2 8.9 9.0  Hemoglobin 12.0 - 15.0 g/dL 13.9 14.4 13.2  Hematocrit 36.0 - 46.0 % 44.1 44.2 43.0  Platelets 150 - 400 K/uL 265 235 296     CMP Latest Ref Rng & Units 09/09/2020 08/09/2020 08/02/2020  Glucose 70 - 99 mg/dL 99 87 92  BUN 8 - 23 mg/dL _0 Creatinine 0.44 - 1.00 mg/dL 0.88 0.91 0.80  Sodium 135 - 145 mmol/L 141 140 144  Potassium 3.5 - 5.1 mmol/L 4.0 4.2 4.5  Chloride 98 - 111 mmol/L 103 99 103  CO2 22 - 32 mmol/L 30 26 30(H)  Calcium 8.9 - 10.3 mg/dL 9.1 9.0 9.2  Total Protein 6.5 - 8.1 g/dL 6.9 - -  Total Bilirubin 0.3 - 1.2 mg/dL 1.1 - -  Alkaline Phos 38 - 126 U/L 139(H) - -  AST 15 - 41 U/L 23 - -  ALT 0 - 44 U/L 15 - -      RADIOGRAPHIC STUDIES: I have personally reviewed the radiological images as listed and agreed with the findings in the report. No results found.   ASSESSMENT & PLAN:  HOLLAN PHILIPP is a 86 y.o. female with    1.Malignant neoplasm of upper-outer quadrant of left breast,lobularcarcinoma, Stage 1A pT2cN0M0, ER/PR+, HER2-,Grade II, chest wall skin recurrence 9/2021ER+/PR+/HER2- -She was diagnosed in 03/2019. She underwent left totalmastectomyby Dr Dalbert Batman on 05/04/19. -Genetictesting was negative for pathogenetic mutations -Oncotype was not recommend due to her  advanced age andmedical comorbidities -Givenearly stage disease,post mastectomy radiation is not recommended.  -Patient inially declined antiestrogen therapy due to age and medical co-morbidities -She unfortunately developed local skin recurrence from breast cancer in 06/2020. Punch biopsy was done by her dermatologist on 06/17/20.Her 07/08/20 PET showshypermetabolic right supraclavicular nodes, and negative uptake on the left chest wall with no skin recurrence, no other metastasis.  -We discussed her [place] PET scan from 08/05/20 which showed No estradiol receptor radiotracer accumulation within the RIGHT supraclavicular lymph nodes which were hypermetabolic on FDG-PET. This is more consistent with COVID vaccination. She does have extensive hypermetabolic uptake in mediastinal nodes and along lungs bases, R>L, which are likely thoracic metastasis.  given her advanced age, and severe COPD, I do not recommend biopsy at this time. Will monitor. I personally reviewed images with patient today.  -Given her ER positive disease I started her on first line Exemestane 35m once daily starting 06/27/20.In the past 2 weeks she has starting having intense diffuse body itching. With no other recent changes, this could be related to the medication or dry skin. Will switch to Anastrozole. She is agreeable.  -Her exam today shows improved nodularity along left chest wall skin. Will continue Exemestane.  -Will repeat [place] PET scan in 3-4 months.  -F/u in 2 months    2. Skin itching  -In the past 2 weeks she has diffuse body itching of skin without presence of rash. This has lead to blood spots from consistent scratching on exam today, and still no rash (09/09/20)  -She has been on Exemestane 2-3 months before this. I discussed this also could be from dry skin. -I will switch her to anastrozole. If itching not resolved, I recommend she discuss this with dermatologist first.   3. Pleural effusion  -She has  b/l effusion on 08/05/20 PET scan. She notes her breathing change indicates this.  -She has had thoracentesis in the past due to her COPD and Afib. She remains on continuous supplemental oxygen.  -Will monitor, will order thoracentesis if indicated.   4. Comorbidities: H/o of ICH and stroke, S/p craniotomy in 02/2015, HTN, Afib, COPD, Shingles and GERD and arthritis, H/o of Recurrent bladder papillary carcinoma Dx in 03/2013 and 08/2014 -Managed by cardiologist Dr. CStanford Breed PCP and Pulmonologist Dr AElsworth Soho Urologist Dr. MTresa Moore  PLAN: -Susan BullockPET scan reviewed  -I called in Anastrozole to switch to, stop exemestane due to intense skin pruritus   -Lab and F/u in 2 months    No problem-specific Assessment & Plan notes found for this encounter.   No orders of the defined types were placed in this encounter.  All questions were answered. The patient knows to call the clinic with any problems, questions or concerns. No barriers to learning was detected. The total time spent in the appointment was 30 minutes.     YTruitt Merle MD 09/09/2020   I, AJoslyn Devon am acting as scribe for YTruitt Merle MD.   I have reviewed the above documentation for accuracy and completeness, and I agree with the above.

## 2020-09-09 ENCOUNTER — Telehealth: Payer: Self-pay | Admitting: Hematology

## 2020-09-09 ENCOUNTER — Inpatient Hospital Stay: Payer: Medicare Other | Attending: Nurse Practitioner

## 2020-09-09 ENCOUNTER — Other Ambulatory Visit: Payer: Self-pay

## 2020-09-09 ENCOUNTER — Inpatient Hospital Stay (HOSPITAL_BASED_OUTPATIENT_CLINIC_OR_DEPARTMENT_OTHER): Payer: Medicare Other | Admitting: Hematology

## 2020-09-09 VITALS — BP 98/67 | HR 63 | Temp 97.0°F | Resp 16 | Ht 65.0 in | Wt 134.8 lb

## 2020-09-09 DIAGNOSIS — I10 Essential (primary) hypertension: Secondary | ICD-10-CM | POA: Insufficient documentation

## 2020-09-09 DIAGNOSIS — Z9981 Dependence on supplemental oxygen: Secondary | ICD-10-CM | POA: Insufficient documentation

## 2020-09-09 DIAGNOSIS — L298 Other pruritus: Secondary | ICD-10-CM | POA: Insufficient documentation

## 2020-09-09 DIAGNOSIS — Z170 Estrogen receptor positive status [ER+]: Secondary | ICD-10-CM | POA: Insufficient documentation

## 2020-09-09 DIAGNOSIS — C50812 Malignant neoplasm of overlapping sites of left female breast: Secondary | ICD-10-CM | POA: Insufficient documentation

## 2020-09-09 DIAGNOSIS — J449 Chronic obstructive pulmonary disease, unspecified: Secondary | ICD-10-CM | POA: Diagnosis not present

## 2020-09-09 DIAGNOSIS — Z9012 Acquired absence of left breast and nipple: Secondary | ICD-10-CM | POA: Diagnosis not present

## 2020-09-09 DIAGNOSIS — Z8673 Personal history of transient ischemic attack (TIA), and cerebral infarction without residual deficits: Secondary | ICD-10-CM | POA: Diagnosis not present

## 2020-09-09 DIAGNOSIS — C50412 Malignant neoplasm of upper-outer quadrant of left female breast: Secondary | ICD-10-CM | POA: Diagnosis not present

## 2020-09-09 DIAGNOSIS — I4891 Unspecified atrial fibrillation: Secondary | ICD-10-CM | POA: Diagnosis not present

## 2020-09-09 DIAGNOSIS — J90 Pleural effusion, not elsewhere classified: Secondary | ICD-10-CM | POA: Insufficient documentation

## 2020-09-09 LAB — CMP (CANCER CENTER ONLY)
ALT: 15 U/L (ref 0–44)
AST: 23 U/L (ref 15–41)
Albumin: 3.6 g/dL (ref 3.5–5.0)
Alkaline Phosphatase: 139 U/L — ABNORMAL HIGH (ref 38–126)
Anion gap: 8 (ref 5–15)
BUN: 19 mg/dL (ref 8–23)
CO2: 30 mmol/L (ref 22–32)
Calcium: 9.1 mg/dL (ref 8.9–10.3)
Chloride: 103 mmol/L (ref 98–111)
Creatinine: 0.88 mg/dL (ref 0.44–1.00)
GFR, Estimated: >60 mL/min (ref >60)
Glucose, Bld: 99 mg/dL (ref 70–99)
Potassium: 4 mmol/L (ref 3.5–5.1)
Sodium: 141 mmol/L (ref 135–145)
Total Bilirubin: 1.1 mg/dL (ref 0.3–1.2)
Total Protein: 6.9 g/dL (ref 6.5–8.1)

## 2020-09-09 LAB — CBC WITH DIFFERENTIAL (CANCER CENTER ONLY)
Abs Immature Granulocytes: 0.01 10*3/uL (ref 0.00–0.07)
Basophils Absolute: 0.1 10*3/uL (ref 0.0–0.1)
Basophils Relative: 1 %
Eosinophils Absolute: 0.1 10*3/uL (ref 0.0–0.5)
Eosinophils Relative: 2 %
HCT: 44.1 % (ref 36.0–46.0)
Hemoglobin: 13.9 g/dL (ref 12.0–15.0)
Immature Granulocytes: 0 %
Lymphocytes Relative: 17 %
Lymphs Abs: 1.4 10*3/uL (ref 0.7–4.0)
MCH: 30.4 pg (ref 26.0–34.0)
MCHC: 31.5 g/dL (ref 30.0–36.0)
MCV: 96.5 fL (ref 80.0–100.0)
Monocytes Absolute: 0.9 10*3/uL (ref 0.1–1.0)
Monocytes Relative: 12 %
Neutro Abs: 5.6 10*3/uL (ref 1.7–7.7)
Neutrophils Relative %: 68 %
Platelet Count: 265 10*3/uL (ref 150–400)
RBC: 4.57 MIL/uL (ref 3.87–5.11)
RDW: 15.4 % (ref 11.5–15.5)
WBC Count: 8.2 10*3/uL (ref 4.0–10.5)
nRBC: 0 % (ref 0.0–0.2)

## 2020-09-09 MED ORDER — ANASTROZOLE 1 MG PO TABS: 1.0000 mg | ORAL_TABLET | Freq: Every day | ORAL | 1 refills | Status: DC

## 2020-09-09 NOTE — Telephone Encounter (Signed)
Scheduled appointments per 1/7 los. Spoke to patient who is aware of appointments date and times.

## 2020-09-10 ENCOUNTER — Encounter: Payer: Self-pay | Admitting: Hematology

## 2020-09-15 ENCOUNTER — Encounter: Payer: Self-pay | Admitting: *Deleted

## 2020-09-16 NOTE — Progress Notes (Signed)
Virtual Visit via Video Note   This visit type was conducted due to national recommendations for restrictions regarding the COVID-19 Pandemic (e.g. social distancing) in an effort to limit this patient's exposure and mitigate transmission in our community.  Due to her co-morbid illnesses, this patient is at least at moderate risk for complications without adequate follow up.  This format is felt to be most appropriate for this patient at this time.  All issues noted in this document were discussed and addressed.  A limited physical exam was performed with this format.  Please refer to the patient's chart for her consent to telehealth for [facility].      Date:  09/19/2020   ID:  [name], DOB 05-18-1935, MRN [id number]  Patient Location:Home Provider Location: Home  PCP:  Janith Lima, MD  Cardiologist:  Dr Stanford Breed  Evaluation Performed:  Follow-Up Visit  Chief Complaint:  FU atrial fibrillation  History of Present Illness:    FU atrial fibrillationand CHF. Patient was admitted in June 2016 with a large right cerebellar hemorrhage. Note her only anticoagulate at that time was aspirin. The hemorrhage required surgical evacuation. Patient developed atrial fibrillation during the hospitalization with difficult to control heart rate. She was felt not to be a candidate for anticoagulation given intracranial hemorrhage. Holter 7/16 showed atrial fibrillation with elevated rate; digoxin added. Ipreviously contacted Dr Kathyrn Sheriff Neurosurgery to see if she would be a candidate for short-term anticoagulation which would then qualify her for a watchman device. He felt this could be pursued.However she declined watchman. Patient admitted April 2021 with CHF. Echocardiogram April 2021 showed vigorous LV systolic function, mild RV dysfunction, severe biatrial enlargement, moderate pulmonary hypertension.  Chest x-ray 09/06/20 showed mild interstitial edema with small right pleural  effusion.  Since last seen,  she does have dyspnea unchanged.  She has lower extremity edema left greater than right.  She denies chest pain or syncope.  Systolic blood pressure in the 90s.  The patient does not have symptoms concerning for COVID-19 infection (fever, chills, cough, or new shortness of breath).    Past Medical History:  Diagnosis Date  . Arthritis   . COPD (chronic obstructive pulmonary disease) ([place])   . Dyspnea on exertion   . GERD (gastroesophageal reflux disease)   . Glaucoma of both eyes   . History of atrial fibrillation without current medication 2010   POST SURG 2010--  PER PT NO ISSUES SINCE  . History of breast cancer    DX DCIS IN 2004--  S/P RIGHT MASTECTOMY , NO CHEMORADIATION---  NO RECURRENCE  . History of CHF (congestive heart failure)    POST SURG 2010  . History of hypertension   . History of shingles    02/ 2015--  back of neck and left flank--  no residual pain  . ICH (intracerebral hemorrhage) ([place]) 02/05/2015  . Nephrolithiasis   . Recurrent bladder papillary carcinoma ([place]) first dx 06/ 2014   s/p  turbt's  and instillation mitomycin c (chemo)//dx again in 2015-different type  . Stroke [facility])    Past Surgical History:  Procedure Laterality Date  . CATARACT EXTRACTION W/ INTRAOCULAR LENS  IMPLANT, BILATERAL    . CRANIECTOMY N/A 02/05/2015   Procedure: SUBOCCIPITAL CRANIECTOMY EVACUATION OF HEMATOMA;  Surgeon: Consuella Lose, MD;  Location: [place] [facility];  Service: Neurosurgery;  Laterality: N/A;  . CYSTOSCOPY W/ RETROGRADES Bilateral 08/11/2014   Procedure: CYSTOSCOPY WITH BILATERAL RETROGRADE PYELOGRAM AND MITOMYCIN INSTILLATION;  Surgeon: Alexis Frock,  MD;  Location: [place];  Service: Urology;  Laterality: Bilateral;  . CYSTOSCOPY W/ URETERAL STENT PLACEMENT Bilateral 03/26/2013   Procedure: CYSTOSCOPY WITH BILATERAL RETROGRADE PYELOGRAM/URETERAL STENT PLACEMENT;  Surgeon: Alexis Frock, MD;  Location: [facility];  Service: Urology;  Laterality: Bilateral;  . CYSTOSCOPY W/ URETERAL STENT PLACEMENT Left 05/13/2013   Procedure: CYSTOSCOPY WITH RETROGRADE PYELOGRAM/URETERAL STENT PLACEMENT STENT EXCHANGE;  Surgeon: Alexis Frock, MD;  Location: [facility];  Service: Urology;  Laterality: Left;  . PARTIAL MASTECTOMY WITH NEEDLE LOCALIZATION Right 01-14-2003   DCIS  . REMOVAL CYST LEFT HAND  2013  . TOTAL KNEE ARTHROPLASTY Right 03-22-2009  . TOTAL MASTECTOMY Right 02-03-2003   W/ SLN BX  AND  POST 03-01-2003 EVACUATION HEMATOMA  . TOTAL MASTECTOMY Left 05/04/2019   Procedure: LEFT TOTAL MASTECTOMY;  Surgeon: Fanny Skates, MD;  Location: [place];  Service: General;  Laterality: Left;  . TRANSTHORACIC ECHOCARDIOGRAM  03-25-2009   MILD LVF/ EF 70-80%/ MILD INCREASE SYSTOLIC PULMONARY  PRESSURE  . TRANSURETHRAL RESECTION OF BLADDER TUMOR WITH GYRUS (TURBT-GYRUS) N/A 03/26/2013   Procedure: TRANSURETHRAL RESECTION OF BLADDER TUMOR WITH GYRUS (TURBT-GYRUS);  Surgeon: Alexis Frock, MD;  Location: [facility];  Service: Urology;  Laterality: N/A;  . TRANSURETHRAL RESECTION OF BLADDER TUMOR WITH GYRUS (TURBT-GYRUS) N/A 05/13/2013   Procedure: TRANSURETHRAL RESECTION OF BLADDER TUMOR WITH GYRUS (TURBT-GYRUS)  RE-STAGING TRANSURETHRAL RESECTION OF BLADDER TUMOR, LEFT RETROGRADE PYELOGRAM AND STENT EXCHANGE;  Surgeon: Alexis Frock, MD;  Location: [facility];  Service: Urology;  Laterality: N/A;  . TRANSURETHRAL RESECTION OF BLADDER TUMOR WITH GYRUS (TURBT-GYRUS) N/A 08/11/2014   Procedure: TRANSURETHRAL RESECTION OF BLADDER TUMOR WITH GYRUS (TURBT-GYRUS);  Surgeon: Alexis Frock, MD;  Location: [facility];  Service: Urology;  Laterality: N/A;  . TUBAL LIGATION       Current Meds  Medication Sig  . albuterol (VENTOLIN HFA) 108 (90 Base) MCG/ACT inhaler Inhale 2 puffs into the lungs every 6 (six) hours as needed for wheezing or shortness of breath.   . anastrozole (ARIMIDEX) 1 MG tablet Take 1 tablet (1 mg total) by mouth daily.  Marland Kitchen arformoterol (BROVANA) 15 MCG/2ML NEBU Take 2 mLs (15 mcg total) by nebulization 2 (two) times daily.  Marland Kitchen atorvastatin (LIPITOR) 10 MG tablet TAKE ONE TABLET DAILY AT 6PM  . Brinzolamide-Brimonidine (SIMBRINZA) 1-0.2 % SUSP Place 1 drop into both eyes in the morning and at bedtime.  . budesonide (PULMICORT) 0.5 MG/2ML nebulizer solution TAKE 57m BY NEBULIZER IN THE MORNING ANDAT BEDTIME  . digoxin (LANOXIN) 0.125 MG tablet TAKE ONE TABLET DAILY  . diltiazem (CARDIZEM CD) 120 MG 24 hr capsule Take 1 capsule (120 mg total) by mouth daily.  .Marland Kitchenexemestane (AROMASIN) 25 MG tablet TAKE ONE TABLET DAILY AFTER BREAKFAST  . fluorometholone (FML) 0.1 % ophthalmic suspension Place 1 drop into both eyes daily.  . furosemide (LASIX) 40 MG tablet Take 1 tablet in the am and 1/2 tablet in the pm  . guaiFENesin (MUCINEX) 600 MG 12 hr tablet Take 600 mg by mouth daily.  .Marland Kitchenipratropium (ATROVENT) 0.02 % nebulizer solution Take 2.5 mLs (0.5 mg total) by nebulization 3 (three) times daily. DX: J44.9  . latanoprost (XALATAN) 0.005 % ophthalmic solution Place 1 drop into both eyes at bedtime.  . OXYGEN Inhale 2 L into the lungs continuous.   . pantoprazole (PROTONIX) 40 MG tablet TAKE ONE TABLET DAILY  . Polyethylene Glycol 3350 (MIRALAX PO) Take 17 g  by mouth daily.  . potassium chloride SA (KLOR-CON) 20 MEQ tablet Take 20 meq by mouth in the morning and 10 meq in the evening  . valACYclovir (VALTREX) 1000 MG tablet Take 1,000 mg by mouth daily.     Allergies:   Heparin and Morphine and related   Social History   Tobacco Use  . Smoking status: Former Smoker    Packs/day: 1.00    Years: 40.00    Pack years: 40.00    Types: Cigarettes    Quit date: 03/20/2003    Years since quitting: 17.5  . Smokeless tobacco: Never Used  Vaping Use  . Vaping Use: Never used  Substance Use Topics  . Alcohol use: No    Alcohol/week: 0.0  standard drinks  . Drug use: No     Family Hx: The patient's family history includes Breast cancer (age of onset: 71) in her maternal aunt; Breast cancer (age of onset: 58) in her daughter; Breast cancer (age of onset: 67) in an other family member; Cancer in her maternal aunt; Colon cancer in her maternal grandmother; Colon cancer (age of onset: 14) in her mother; Prostate cancer in her paternal grandfather; Stroke in her maternal grandmother and paternal grandmother. There is no history of Heart attack or Hypertension.  ROS:   Please see the history of present illness.    No Fever, chills  or productive cough All other systems reviewed and are negative.  Recent Labs: 12/14/2019: B Natriuretic Peptide 738.5; TSH 1.490 12/15/2019: Magnesium 2.0 08/02/2020: NT-Pro BNP 4,565 09/09/2020: ALT 15; BUN 19; Creatinine 0.88; Hemoglobin 13.9; Platelet Count 265; Potassium 4.0; Sodium 141   Recent Lipid Panel Lab Results  Component Value Date/Time   CHOL 121 04/19/2017 09:36 AM   TRIG 114 04/19/2017 09:36 AM   HDL 46 04/19/2017 09:36 AM   CHOLHDL 2.6 04/19/2017 09:36 AM   CHOLHDL 3 05/09/2016 12:16 PM   LDLCALC 52 04/19/2017 09:36 AM    Wt Readings from Last 3 Encounters:  09/19/20 134 lb (60.8 kg)  09/09/20 134 lb 12.8 oz (61.1 kg)  09/06/20 137 lb 2 oz (62.2 kg)     Objective:    Vital Signs:  Ht _0  (1.676 m)   Wt 134 lb (60.8 kg)   LMP 12/03/2002   BMI 21.63 kg/m    VITAL SIGNS:  reviewed NAD Answers questions appropriately Normal affect Remainder of physical examination not performed (telehealth visit; coronavirus pandemic)  ASSESSMENT & PLAN:    1 Permanent atrial fibrillation-continue Cardizem and digoxin for rate control.  As previously outlined the patient is not a candidate for long-term anticoagulation given previous intracranial hemorrhage on aspirin alone.  We have previously discussed short-term anticoagulation and watchman placement but she declined.  She  understands the higher risk of CVA off of anticoagulation.  2 dyspnea-this is felt to be multifactorial including underlying COPD with diastolic congestive heart failure.  She remains dyspneic and recent chest x-ray showed persistent CHF.  Increase Lasix to 60 mg twice daily.  Check potassium and renal function in 1 week.  I think there is a significant component of underlying lung disease.    3 hypertension-patient's blood pressure is controlled.  Continue present medications and follow.  4 hyperlipidemia-continue statin.  5 COPD-Per pulmonary.  6 breast cancer per-appears to have recurrence.  Per oncology.  COVID-19 Education: The importance of social distancing was discussed today.  Time:   Today, I have spent 15 minutes with the patient with telehealth technology discussing  the above problems.     Medication Adjustments/Labs and Tests Ordered: Current medicines are reviewed at length with the patient today.  Concerns regarding medicines are outlined above.   Tests Ordered: No orders of the defined types were placed in this encounter.   Medication Changes: No orders of the defined types were placed in this encounter.   Follow Up:  In Person in 3 month(s)  Signed, Kirk Ruths, MD  09/19/2020 9:57 AM    [place]

## 2020-09-19 ENCOUNTER — Telehealth (INDEPENDENT_AMBULATORY_CARE_PROVIDER_SITE_OTHER): Payer: Medicare Other | Admitting: Cardiology

## 2020-09-19 ENCOUNTER — Ambulatory Visit: Payer: Medicare Other | Admitting: Cardiology

## 2020-09-19 ENCOUNTER — Encounter: Payer: Self-pay | Admitting: Cardiology

## 2020-09-19 ENCOUNTER — Telehealth: Payer: Self-pay | Admitting: Cardiology

## 2020-09-19 VITALS — Ht 66.0 in | Wt 134.0 lb

## 2020-09-19 DIAGNOSIS — I10 Essential (primary) hypertension: Secondary | ICD-10-CM

## 2020-09-19 DIAGNOSIS — I5032 Chronic diastolic (congestive) heart failure: Secondary | ICD-10-CM | POA: Diagnosis not present

## 2020-09-19 DIAGNOSIS — I4821 Permanent atrial fibrillation: Secondary | ICD-10-CM

## 2020-09-19 MED ORDER — DIGOXIN 125 MCG PO TABS: 125.0000 ug | ORAL_TABLET | Freq: Every day | ORAL | 2 refills | Status: AC

## 2020-09-19 MED ORDER — FUROSEMIDE 40 MG PO TABS: 60.0000 mg | ORAL_TABLET | Freq: Two times a day (BID) | ORAL | 3 refills | Status: DC

## 2020-09-19 MED ORDER — FUROSEMIDE 40 MG PO TABS: 40.0000 mg | ORAL_TABLET | Freq: Two times a day (BID) | ORAL | 3 refills | Status: DC

## 2020-09-19 MED ORDER — FUROSEMIDE 20 MG PO TABS: 20.0000 mg | ORAL_TABLET | Freq: Every day | ORAL | 3 refills | Status: DC

## 2020-09-19 NOTE — Telephone Encounter (Signed)
Pt c/o medication issue:  1. Name of Medication:   furosemide (LASIX) 20 MG tablet    2. How are you currently taking this medication (dosage and times per day)? Take 1 tablet (20 mg total) by mouth daily.  3. Are you having a reaction (difficulty breathing--STAT)?   4. What is your medication issue? [facility] pharmacy calling would like to clarify script of the medication. It sayd tke 1 tablet a day but there's note saying : Notes to Pharmacy: TAKING 40 MG AND 20 MG TWICE DAILY FOR TOTAL 60 MG TWICE DAILY. They need to know how much to give the pt.

## 2020-09-19 NOTE — Patient Instructions (Signed)
Medication Instructions:   INCREASE FUROSEMIDE TO 60 MG TWICE DAILY= 1 AND 1/2 OF THE 40 MG TABLET TWICE DAILY  *If you need a refill on your cardiac medications before your next appointment, please call your pharmacy*   Lab Work:  Your physician recommends that you return for lab work in: [place]  If you have labs (blood work) drawn today and your tests are completely normal, you will receive your results only by: Marland Kitchen MyChart Message (if you have MyChart) OR . A paper copy in the mail If you have any lab test that is abnormal or we need to change your treatment, we will call you to review the results.   Follow-Up: At [facility], you and your health needs are our priority.  As part of our continuing mission to provide you with exceptional heart care, we have created designated Provider Care Teams.  These Care Teams include your primary Cardiologist (physician) and Advanced Practice Providers (APPs -  Physician Assistants and Nurse Practitioners) who all work together to provide you with the care you need, when you need it.  We recommend signing up for the patient portal called "MyChart".  Sign up information is provided on this After Visit Summary.  MyChart is used to connect with patients for Virtual Visits (Telemedicine).  Patients are able to view lab/test results, encounter notes, upcoming appointments, etc.  Non-urgent messages can be sent to your provider as well.   To learn more about what you can do with MyChart, go to [email or web address].    Your next appointment:   8 week(s)  The format for your next appointment:   In Person  Provider:   Kirk Ruths, MD

## 2020-09-19 NOTE — Telephone Encounter (Signed)
Spoke with pt pharmacy, after avs completed the patient decided she wanted a 20 mg furosemide to take with her 40 mg to equal 60 mg twice daily.

## 2020-09-19 NOTE — Telephone Encounter (Signed)
Can you please advise?   I seen the most recent note:  Medication Instructions:    INCREASE FUROSEMIDE TO 60 MG TWICE DAILY= 1 AND 1/2 OF THE 40 MG TABLET TWICE DAILY    But then the RX was two separate sent in- 20 mg that had it to take 60 mg, and then the 40 mg said twice a day. So just wanted to check in, I can call just wanted to verify which med you wanted sent.   Thank you!

## 2020-09-27 DIAGNOSIS — I5032 Chronic diastolic (congestive) heart failure: Secondary | ICD-10-CM | POA: Diagnosis not present

## 2020-09-28 LAB — BASIC METABOLIC PANEL
BUN/Creatinine Ratio: 20 (ref 12–28)
BUN: 18 mg/dL (ref 8–27)
CO2: 29 mmol/L (ref 20–29)
Calcium: 9.1 mg/dL (ref 8.7–10.3)
Chloride: 99 mmol/L (ref 96–106)
Creatinine, Ser: 0.9 mg/dL (ref 0.57–1.00)
GFR calc Af Amer: 68 mL/min/{1.73_m2} (ref >59)
GFR calc non Af Amer: 59 mL/min/{1.73_m2} — ABNORMAL LOW (ref >59)
Glucose: 78 mg/dL (ref 65–99)
Potassium: 4.4 mmol/L (ref 3.5–5.2)
Sodium: 141 mmol/L (ref 134–144)

## 2020-09-29 ENCOUNTER — Other Ambulatory Visit: Payer: Self-pay | Admitting: Internal Medicine

## 2020-09-29 DIAGNOSIS — J418 Mixed simple and mucopurulent chronic bronchitis: Secondary | ICD-10-CM

## 2020-09-29 NOTE — Telephone Encounter (Signed)
Nothing noted in message. Will close encounter as it was created in error.

## 2020-09-30 ENCOUNTER — Encounter: Payer: Self-pay | Admitting: *Deleted

## 2020-11-04 NOTE — Progress Notes (Signed)
Reader   [phone] [phone]   Clinic Follow up Note   Patient Care Team: Janith Lima, MD as PCP - General (Internal Medicine) Stanford Breed, Denice Bors, MD as PCP - Cardiology (Cardiology) Rockwell Germany, RN as Oncology Nurse Navigator Mauro Kaufmann, RN as Oncology Nurse Navigator Truitt Merle, MD as Consulting Physician (Hematology) Fanny Skates, MD as Consulting Physician (General Surgery) Kyung Rudd, MD as Consulting Physician (Radiation Oncology) Alexis Frock, MD as Consulting Physician (Anesthesiology) Alexis Frock, MD as Consulting Physician (Urology) [name], MD as Consulting Physician (Cardiology) Rigoberto Noel, MD as Consulting Physician (Pulmonary Disease)  Date of Service:  11/07/2020  CHIEF COMPLAINT: f/u of left breast cancer  SUMMARY OF ONCOLOGIC HISTORY: Oncology History Overview Note  Cancer Staging Malignant neoplasm of upper-outer quadrant of left breast in female, estrogen receptor positive ([place]) Staging form: Breast, AJCC 8th Edition - Clinical stage from 04/01/2019: Stage IA (cT1c, cN0, cM0, G2, ER+, PR+, HER2-) - Signed by Truitt Merle, MD on 04/07/2019 - Pathologic stage from 05/04/2019: Stage Unknown (pT2, pNX, cM0, G2, ER+, PR+, HER2-) - Signed by Truitt Merle, MD on 05/27/2019    Malignant neoplasm of upper-outer quadrant of left breast in female, estrogen receptor positive ([place])  03/30/2019 Mammogram   Left diagnostic mammogram and [place] 03/30/19  IMPRESSION The 1.6cm heterogenous focal area in the left breast at 1:00-2:00 4-6cm form the nipple, corresponding to the hard palpable area in the left breast is suspicious of malignancy, particularly lobular carcinoma.     04/01/2019 Cancer Staging   Staging form: Breast, AJCC 8th Edition - Clinical stage from 04/01/2019: Stage IA (cT1c, cN0, cM0, G2, ER+, PR+, HER2-) - Signed by Truitt Merle, MD on 04/07/2019   04/01/2019 Initial Biopsy   Diagnosis 04/01/19 Breast,  left, needle core biopsy, 2 o'clock, 5cmfn - INVASIVE MAMMARY CARCINOMA, SEE COMMENT. - MAMMARY CARCINOMA IN SITU.   04/01/2019 Receptors her2   The tumor cells are NEGATIVE for Her2 (1+). Estrogen Receptor: 100%, POSITIVE, STRONG STAINING INTENSITY Progesterone Receptor: 30%, POSITIVE, STRONG STAINING INTENSITY Proliferation Marker Ki67: 20%   04/03/2019 Initial Diagnosis   Malignant neoplasm of upper-outer quadrant of left breast in female, estrogen receptor positive (H[facility])    Genetic Testing   Negative genetic testing. No pathogenic variants identified on the [facility] Breast Cancer STAT Panel + Common Hereditary Cancers Panel. The STAT Breast cancer panel offered by [facility] includes sequencing and rearrangement analysis for the following 9 genes:  ATM, BRCA1, BRCA2, CDH1, CHEK2, PALB2, PTEN, STK11 and TP53.  The Common Hereditary Cancers Panel offered by [facility] includes sequencing and/or deletion duplication testing of the following 47 genes: APC, ATM, AXIN2, BARD1, BMPR1A, BRCA1, BRCA2, BRIP1, CDH1, CDKN2A (p14ARF), CDKN2A (p16INK4a), CKD4, CHEK2, CTNNA1, DICER1, EPCAM (Deletion/duplication testing only), GREM1 (promoter region deletion/duplication testing only), KIT, MEN1, MLH1, MSH2, MSH3, MSH6, MUTYH, NBN, NF1, NHTL1, PALB2, PDGFRA, PMS2, POLD1, POLE, PTEN, RAD50, RAD51C, RAD51D,  SDHB, SDHC, SDHD, SMAD4, SMARCA4. STK11, TP53, TSC1, TSC2, and VHL.  The following genes were evaluated for sequence changes only: SDHA and HOXB13 c.251G>A variant only. The report date is 04/17/2019.    05/04/2019 Surgery    LEFT TOTAL MASTECTOMY by Dr. Dalbert Batman 05/04/19   05/04/2019 Pathology Results   Diagnosis 05/04/19 1. Breast, excision - INVASIVE LOBULAR CARCINOMA, NOTTINGHAM GRADE 2 - SEE COMMENT 2. Breast, simple mastectomy, Left total - MULTIFOCAL, INVASIVE LOBULAR CARCINOMA, NOTTINGHAM GRADE 2 OF 3, 2.1 CM - MARGINS UNINVOLVED BY CARCINOMA (0.2 CM; POSTERIOR MARGIN) -  LYMPHOVASCULAR SPACE INVASION  PRESENT - PREVIOUS BIOPSY SITE CHANGES PRESENT - SEBORRHEIC KERATOSIS - SEE ONCOLOGY TABLE AND COMMENT BELOW   05/04/2019 Cancer Staging   Staging form: Breast, AJCC 8th Edition - Pathologic stage from 05/04/2019: Stage Unknown (pT2, pNX, cM0, G2, ER+, PR+, HER2-) - Signed by Truitt Merle, MD on 05/27/2019   05/31/2020 Breast US   Left Breast US 05/31/20 Multiple, raised, hard, pink lesions in the skin of th left chest surrounding the mastectomy scar. There is one inferior and lateral to the scar which has a corresponding 1.1cm vascular mass in teh skin at [place]. Dermatology consult for skin biopsy of the lesion is recommended to exclude recurrent breast cancer to the skin.    06/17/2020 Relapse/Recurrence   PUNCH BIOPSY by Dermatologist   Diagnosis 1. Skin Biopsy-(P), A) right inferior lateral antecubital shave & ED&C SEBORRHEIC KERATOSIS, IRRITATED  2. Skin Biopsy-(P), B) left inferior central chest punch  POORLY DIFFERENTIATED ADENOCARCINOMA CONSISTENT WITH BREAST ORIGIN INVOLVING THE DERMIS, SEE DESCRIPTION   HER2 NEGATIVE  ER Positive >95% PR NEGATIVE     06/27/2020 -  Anti-estrogen oral therapy   First-line Exemestane 55m once daily starting 06/25/20. Switched to Anastrozole in 09/2020 given skin pruritus      07/08/2020 PET scan   IMPRESSION: 1. No metabolic activity associated with nodularity along the mastectomy scar in the LEFT chest wall. 2. Two hypermetabolic RIGHT supraclavicular lymph nodes. Hypermetabolic activity may relate to RIGHT arm COVID vaccination of 10/ 03/2020. Estradiol FDG PET scan may differentiate versus attention on follow-up versus biopsy. 3. No evidence distant metastatic disease. 4. Moderate RIGHT pleural effusion and basilar atelectasis.   08/05/2020 PET scan   IMPRESSION: 1. No estradiol receptor radiotracer accumulation within the RIGHT supraclavicular lymph nodes which were hypermetabolic on comparison FDG PET scan. Favor activity related to COVID  vaccination. 2. Linear band cutaneous radiotracer activity within the LEFT chest wall presumably relates to estrogen positive punch biopsies performed by dermatologist. 3. No clear evidence of metastatic estrogen positive breast cancer on the remaining whole-body scan. 4. Intense radiotracer activity within the mediastinum and thoracic inlet without clear corresponding CT findings. Favor benign physiologic activity. 5. Intense radiotracer activity associated with LEFT RIGHT lower lobe atelectasis is also favored benign physiologic activity. 6. Stable bilateral pleural effusions.   ADDENDUM: Although there is scant lymphoid tissue and small nodes associated with the intense mediastinal F 18 estradiol activity, upon conferring with MD vendor expert, concern exists for invasive lobular carcinoma to the mediastinum.   08/05/2020 PET scan   [place] PET IMPRESSION: 1. No estradiol receptor radiotracer accumulation within the RIGHT supraclavicular lymph nodes which were hypermetabolic on comparison FDG PET scan. Favor activity related to COVID vaccination. 2. Linear band cutaneous radiotracer activity within the LEFT chest wall presumably relates to estrogen positive punch biopsies performed by dermatologist. 3. No clear evidence of metastatic estrogen positive breast cancer on the remaining whole-body scan. 4. Intense radiotracer activity within the mediastinum and thoracic inlet without clear corresponding CT findings. Favor benign physiologic activity. 5. Intense radiotracer activity associated with LEFT RIGHT lower lobe atelectasis is also favored benign physiologic activity. 6. Stable bilateral pleural effusions.     ADDENDUM: Although there is scant lymphoid tissue and small nodes associated with the intense mediastinal F 18 estradiol activity, upon conferring with MD vendor expert, concern exists for invasive lobular carcinoma to the mediastinum.   Recommend sampling  either the small RIGHT thoracic inlet lymph nodes/supraclavicular nodes or potentially bronchoscopy with sampling  of the subcarinal intense reactive lymphoid tissue.      CURRENT THERAPY:  First-line Exemestane 28m once daily starting 06/25/20. Switched to Anastrozole in 09/2020 given skin pruritus     INTERVAL HISTORY:  Susan BOUTELLEis here for a follow up. She presents to the clinic with her son. She notes she has been feeling more SOB. She has been on the same amount of Oxygen. She notes left upper back soreness. This pain will wake her up. Her back pain has been present for 2 months. She notes she is tolerating anastrozole well and her rash is resolving. She will see Dr AEartha Inchin 3 days and Dr CStanford Breedin April. She notes she is on Lasix 668min the AM and 4036mn the PM. She has low BP today. She does not check her BP at home.     REVIEW OF SYSTEMS:   Constitutional: Denies fevers, chills or abnormal weight loss Eyes: Denies blurriness of vision Ears, nose, mouth, throat, and face: Denies mucositis or sore throat Respiratory: (+) increased SOB, on continuous supplemental oxygen  Cardiovascular: Denies palpitation, chest discomfort (+) lower extremity swelling Gastrointestinal:  Denies nausea, heartburn or change in bowel habits Skin: (+) Skin rash of torso is resolving.  MSK: (+) left upper back pain  Lymphatics: Denies new lymphadenopathy or easy bruising Neurological:Denies numbness, tingling or new weaknesses Behavioral/Psych: Mood is stable, no new changes  All other systems were reviewed with the patient and are negative.  MEDICAL HISTORY:  Past Medical History:  Diagnosis Date  . Arthritis   . COPD (chronic obstructive pulmonary disease) ([facility] . Dyspnea on exertion   . GERD (gastroesophageal reflux disease)   . Glaucoma of both eyes   . History of atrial fibrillation without current medication 2010   POST SURG 2010--  PER PT NO ISSUES SINCE  . History of breast  cancer    DX DCIS IN 2004--  S/P RIGHT MASTECTOMY , NO CHEMORADIATION---  NO RECURRENCE  . History of CHF (congestive heart failure)    POST SURG 2010  . History of hypertension   . History of shingles    02/ 2015--  back of neck and left flank--  no residual pain  . ICH (intracerebral hemorrhage) ([facility]/12/2014  . Nephrolithiasis   . Recurrent bladder papillary carcinoma ([facility] 06/ 2014   s/p  turbt's  and instillation mitomycin c (chemo)//dx again in 2015-different type  . [facility]   SURGICAL HISTORY: Past Surgical History:  Procedure Laterality Date  . CATARACT EXTRACTION W/ INTRAOCULAR LENS  IMPLANT, BILATERAL    . CRANIECTOMY N/A 02/05/2015   Procedure: SUBOCCIPITAL CRANIECTOMY EVACUATION OF HEMATOMA;  Surgeon: NeeConsuella LoseD;  [facility];  Service: Neurosurgery;  Laterality: N/A;  . CYSTOSCOPY W/ RETROGRADES Bilateral 08/11/2014   Procedure: CYSTOSCOPY WITH BILATERAL RETROGRADE PYELOGRAM AND MITOMYCIN INSTILLATION;  Surgeon: TheAlexis FrockD;  [facility];  Laterality: Bilateral;  . CYSTOSCOPY W/ URETERAL STENT PLACEMENT Bilateral 03/26/2013   Procedure: CYSTOSCOPY WITH BILATERAL RETROGRADE PYELOGRAM/URETERAL STENT PLACEMENT;  Surgeon: TheAlexis FrockD;  [facility];  Laterality: Bilateral;  . CYSTOSCOPY W/ URETERAL STENT PLACEMENT Left 05/13/2013   Procedure: CYSTOSCOPY WITH RETROGRADE PYELOGRAM/URETERAL STENT PLACEMENT STENT EXCHANGE;  Surgeon: TheAlexis FrockD;  [facility];  Laterality: Left;  . PARTIAL MASTECTOMY WITH NEEDLE LOCALIZATION Right 01-14-2003   DCIS  .  REMOVAL CYST LEFT HAND  2013  . TOTAL KNEE ARTHROPLASTY Right 03-22-2009  . TOTAL MASTECTOMY Right 02-03-2003   W/ SLN BX  AND  POST 03-01-2003 EVACUATION HEMATOMA  . TOTAL MASTECTOMY Left 05/04/2019   Procedure: LEFT TOTAL MASTECTOMY;  Surgeon:  Fanny Skates, MD;  Location: [place];  Service: General;  Laterality: Left;  . TRANSTHORACIC ECHOCARDIOGRAM  03-25-2009   MILD LVF/ EF 70-80%/ MILD INCREASE SYSTOLIC PULMONARY  PRESSURE  . TRANSURETHRAL RESECTION OF BLADDER TUMOR WITH GYRUS (TURBT-GYRUS) N/A 03/26/2013   Procedure: TRANSURETHRAL RESECTION OF BLADDER TUMOR WITH GYRUS (TURBT-GYRUS);  Surgeon: Alexis Frock, MD;  [facility];  Service: Urology;  Laterality: N/A;  . TRANSURETHRAL RESECTION OF BLADDER TUMOR WITH GYRUS (TURBT-GYRUS) N/A 05/13/2013   Procedure: TRANSURETHRAL RESECTION OF BLADDER TUMOR WITH GYRUS (TURBT-GYRUS)  RE-STAGING TRANSURETHRAL RESECTION OF BLADDER TUMOR, LEFT RETROGRADE PYELOGRAM AND STENT EXCHANGE;  Surgeon: Alexis Frock, MD;  Location: [facility];  Service: Urology;  Laterality: N/A;  . TRANSURETHRAL RESECTION OF BLADDER TUMOR WITH GYRUS (TURBT-GYRUS) N/A 08/11/2014   Procedure: TRANSURETHRAL RESECTION OF BLADDER TUMOR WITH GYRUS (TURBT-GYRUS);  Surgeon: Alexis Frock, MD;  Location: [facility];  Service: Urology;  Laterality: N/A;  . TUBAL LIGATION      I have reviewed the social history and family history with the patient and they are unchanged from previous note.  ALLERGIES:  is allergic to heparin and morphine and related.  MEDICATIONS:  Current Outpatient Medications  Medication Sig Dispense Refill  . lidocaine (LIDODERM) 5 % Place 1 patch onto the skin daily. Remove & Discard patch within 12 hours or as directed by MD 30 patch 0  . albuterol (VENTOLIN HFA) 108 (90 Base) MCG/ACT inhaler Inhale 2 puffs into the lungs every 6 (six) hours as needed for wheezing or shortness of breath. 8 g 2  . anastrozole (ARIMIDEX) 1 MG tablet Take 1 tablet (1 mg total) by mouth daily. 30 tablet 3  . atorvastatin (LIPITOR) 10 MG tablet TAKE ONE TABLET DAILY AT 6PM 90 tablet 3  . Brinzolamide-Brimonidine (SIMBRINZA) 1-0.2 % SUSP Place 1 drop into both eyes in the  morning and at bedtime.    Marland Kitchen BROVANA 15 MCG/2ML NEBU USE 1 VIAL  IN  NEBULIZER TWICE  DAILY - (Morning And Evening) 120 mL 5  . budesonide (PULMICORT) 0.5 MG/2ML nebulizer solution TAKE 75m BY NEBULIZER IN THE MORNING ANDAT BEDTIME 120 mL 1  . digoxin (LANOXIN) 0.125 MG tablet Take 1 tablet (125 mcg total) by mouth daily. 90 tablet 2  . diltiazem (CARDIZEM CD) 120 MG 24 hr capsule Take 1 capsule (120 mg total) by mouth daily. 30 capsule 11  . fluorometholone (FML) 0.1 % ophthalmic suspension Place 1 drop into both eyes daily.    . furosemide (LASIX) 20 MG tablet Take 1 tablet (20 mg total) by mouth daily. 180 tablet 3  . furosemide (LASIX) 40 MG tablet Take 1 tablet (40 mg total) by mouth 2 (two) times daily. T 270 tablet 3  . guaiFENesin (MUCINEX) 600 MG 12 hr tablet Take 600 mg by mouth daily.    .Marland Kitchenipratropium (ATROVENT) 0.02 % nebulizer solution Take 2.5 mLs (0.5 mg total) by nebulization 3 (three) times daily. DX: J44.9 225 mL 5  . latanoprost (XALATAN) 0.005 % ophthalmic solution Place 1 drop into both eyes at bedtime.    . OXYGEN Inhale 2 L into the lungs continuous.     . pantoprazole (PROTONIX) 40 MG tablet TAKE  ONE TABLET DAILY 90 tablet 1  . Polyethylene Glycol 3350 (MIRALAX PO) Take 17 g by mouth daily.    . potassium chloride SA (KLOR-CON) 20 MEQ tablet Take 20 meq by mouth in the morning and 10 meq in the evening    . valACYclovir (VALTREX) 1000 MG tablet Take 1,000 mg by mouth daily.     No current facility-administered medications for this visit.    PHYSICAL EXAMINATION: ECOG PERFORMANCE STATUS: 3 - Symptomatic, >50% confined to bed  Vitals:   11/07/20 1450  BP: (!) 87/53  Pulse: 73  Resp: 16  Temp: (!) 97 F (36.1 C)   Filed Weights   11/07/20 1450  Weight: 133 lb 14.4 oz (60.7 kg)    GENERAL:alert, no distress and comfortable SKIN: skin color, texture, turgor are normal, no rashes or significant lesions EYES: normal, Conjunctiva are pink and non-injected, sclera  clear  NECK: supple, thyroid normal size, non-tender, without nodularity LYMPH:  no palpable lymphadenopathy in the cervical, axillary  LUNGS: clear to auscultation and percussion with normal breathing effort HEART: regular rate & rhythm and no murmurs (+) B/l lower extremity edema ABDOMEN:abdomen soft, non-tender and normal bowel sounds Musculoskeletal:no cyanosis of digits and no clubbing  NEURO: alert & oriented x 3 with fluent speech, no focal motor/sensory deficits  LABORATORY DATA:  I have reviewed the data as listed CBC Latest Ref Rng & Units 11/07/2020 09/09/2020 05/26/2020  WBC 4.0 - 10.5 K/uL 8.6 8.2 8.9  Hemoglobin 12.0 - 15.0 g/dL 14.0 13.9 14.4  Hematocrit 36.0 - 46.0 % 44.4 44.1 44.2  Platelets 150 - 400 K/uL 258 265 235     CMP Latest Ref Rng & Units 11/07/2020 09/27/2020 09/09/2020  Glucose 70 - 99 mg/dL 93 78 99  BUN 8 - 23 mg/dL _0 Creatinine 0.44 - 1.00 mg/dL 0.89 0.90 0.88  Sodium 135 - 145 mmol/L 138 141 141  Potassium 3.5 - 5.1 mmol/L 4.0 4.4 4.0  Chloride 98 - 111 mmol/L 99 99 103  CO2 22 - 32 mmol/L _1 Calcium 8.9 - 10.3 mg/dL 8.9 9.1 9.1  Total Protein 6.5 - 8.1 g/dL 6.7 - 6.9  Total Bilirubin 0.3 - 1.2 mg/dL 0.9 - 1.1  Alkaline Phos 38 - 126 U/L 158(H) - 139(H)  AST 15 - 41 U/L 24 - 23  ALT 0 - 44 U/L 15 - 15      RADIOGRAPHIC STUDIES: I have personally reviewed the radiological images as listed and agreed with the findings in the report. No results found.   ASSESSMENT & PLAN:  LEONARD HENDLER is a 86 y.o. female with    1.Malignant neoplasm of upper-outer quadrant of left breast,lobularcarcinoma, Stage 1A pT2cN0M0, ER/PR+, HER2-,Grade II, chest wall skin recurrence 9/2021ER+/PR+/HER2- -She was diagnosed in 03/2019. She underwent left totalmastectomyby Dr Dalbert Batman on 05/04/19. -Genetictesting was negative for pathogenetic mutations -Oncotype was not recommend due to her advanced age andmedical comorbidities -Givenearly stage  disease,post mastectomy radiation was not recommended.  -Patient initially declined antiestrogen therapy due to age and medical co-morbidities -She unfortunately developed local skin recurrence from breast cancerin 06/2020. Punch biopsy was done by her dermatologiston 06/17/20.Her11/5/21 PET showshypermetabolic right supraclavicular nodes, and negative uptake on the left chest wall (possible due to low tumor burden on chest wall), no other metastasis.  -Her 08/05/20 [place] PET scanshowed No estradiol receptor radiotracer accumulation within the RIGHT supraclavicular lymph nodes which were hypermetabolic on FDG-PET. This is more consistent with COVID vaccination.  She does have extensive hypermetabolic uptake in mediastinal nodes and along lungs bases, R>L, which are likely thoracic metastasis, given the septicity of [place] PET.  -Given her ER positive disease I started her on first line Exemestane 69m once daily starting 06/27/20.Due to skin pruritus (likely reaction) I switched her to Anastrozole in 09/2020. She is tolerating well.  -plan to repeat staging scan in a month. She is reluctant to do PET again. I will get CT san to evaluate her lungs and thoracic disease given her left upper back pain. She is agreeable.  -F/u in 1 months with CT Chest results.    2. Skin itching  -Over 2 weeks she had diffuse body itching of skin without presence of rash. This has lead to blood spots from consistent scratching, and still no rash (09/09/20)  -She has been on Exemestane 2-3 months before this. I discussed this also could be from dry skin. -After switching to anastrozole her rash has resolved    3. Pleural effusion  -She has b/l effusion on 08/05/20 PET scan.  -She has had thoracentesis in the past due to her COPD and Afib. She remains on continuous supplemental oxygen.  -Will monitor, will order thoracentesis if indicated.  -Her 08/05/20 [place] PET showed extensive hypermetabolic uptake in  mediastinal nodes and along lungs bases, R>L, which are likely thoracic metastasis.  -She notes being more SOB for the past 3 weeks, but remains on same level supplemental oxygen. Lung sounds are normal on exam today -I recommend she f/u with Dr AEartha Inchand Dr CStanford Breedsoon -repeat CT chest in a month    4. Comorbidities: H/o of ICH and stroke, S/p craniotomy in 02/2015, HTN, Afib, COPD, Shingles and GERD and arthritis, H/o of Recurrent bladder papillary carcinoma Dx in 03/2013 and 08/2014 -Managed by cardiologist Dr. CStanford Breed PCP and Pulmonologist Dr AElsworth Soho Urologist Dr. MTresa Moore -BP at 87/53 today (11/07/20) She does feel dizzy today. I recommend holding Lasix tonight and drink more water. I also recommend she check her BP at home daily. I will copy Dr CStanford Breed    5. Left upper Back Pain -She notes Left upper back pain of her back for the past 2 months. This has impacted her sleep.  -She notes attributes part of this to the abnormal curvature of her spine. She has no skeletal mets on her 08/05/20 PET scan.  -I discussed calling Lidocaine patch and she can use heating pad.  -will monitor.    PLAN: -I called in Lidocaine patch for her left upper back pain  -Continue anastrozole  -Copy note to Dr CStanford Breedand Dr AEartha Inchfor her f/u with them soon due to her worsening dyspnea  -F/u in one month with lab and CT Chest wo contrast a few days before.    No problem-specific Assessment & Plan notes found for this encounter.   Orders Placed This Encounter  Procedures  . CT Chest Wo Contrast    Standing Status:   Future    Standing Expiration Date:   11/07/2021    Order Specific Question:   [facility]    [facility]  All questions were answered. The patient knows to call the clinic with any problems, questions or concerns. No barriers to learning was detected. The total time spent in the appointment was 30 minutes.     YTruitt Merle MD 11/07/2020   I, AJoslyn Devon am acting as scribe for YTruitt Merle MD.   I  have reviewed the above documentation for accuracy and completeness, and I agree with the above.

## 2020-11-07 ENCOUNTER — Other Ambulatory Visit: Payer: Self-pay | Admitting: Hematology

## 2020-11-07 ENCOUNTER — Encounter: Payer: Self-pay | Admitting: Hematology

## 2020-11-07 ENCOUNTER — Inpatient Hospital Stay: Payer: Medicare Other | Attending: Hematology

## 2020-11-07 ENCOUNTER — Other Ambulatory Visit: Payer: Self-pay | Admitting: Pulmonary Disease

## 2020-11-07 ENCOUNTER — Other Ambulatory Visit: Payer: Self-pay

## 2020-11-07 ENCOUNTER — Telehealth: Payer: Self-pay | Admitting: Hematology

## 2020-11-07 ENCOUNTER — Inpatient Hospital Stay (HOSPITAL_BASED_OUTPATIENT_CLINIC_OR_DEPARTMENT_OTHER): Payer: Medicare Other | Admitting: Hematology

## 2020-11-07 VITALS — BP 87/53 | HR 73 | Temp 97.0°F | Resp 16 | Ht 66.0 in | Wt 133.9 lb

## 2020-11-07 DIAGNOSIS — Z8673 Personal history of transient ischemic attack (TIA), and cerebral infarction without residual deficits: Secondary | ICD-10-CM | POA: Insufficient documentation

## 2020-11-07 DIAGNOSIS — Z170 Estrogen receptor positive status [ER+]: Secondary | ICD-10-CM | POA: Insufficient documentation

## 2020-11-07 DIAGNOSIS — Z9981 Dependence on supplemental oxygen: Secondary | ICD-10-CM | POA: Diagnosis not present

## 2020-11-07 DIAGNOSIS — I10 Essential (primary) hypertension: Secondary | ICD-10-CM | POA: Insufficient documentation

## 2020-11-07 DIAGNOSIS — C50812 Malignant neoplasm of overlapping sites of left female breast: Secondary | ICD-10-CM | POA: Insufficient documentation

## 2020-11-07 DIAGNOSIS — M5489 Other dorsalgia: Secondary | ICD-10-CM | POA: Insufficient documentation

## 2020-11-07 DIAGNOSIS — K219 Gastro-esophageal reflux disease without esophagitis: Secondary | ICD-10-CM | POA: Diagnosis not present

## 2020-11-07 DIAGNOSIS — I4891 Unspecified atrial fibrillation: Secondary | ICD-10-CM | POA: Insufficient documentation

## 2020-11-07 DIAGNOSIS — J90 Pleural effusion, not elsewhere classified: Secondary | ICD-10-CM | POA: Diagnosis not present

## 2020-11-07 DIAGNOSIS — C50412 Malignant neoplasm of upper-outer quadrant of left female breast: Secondary | ICD-10-CM

## 2020-11-07 DIAGNOSIS — L299 Pruritus, unspecified: Secondary | ICD-10-CM | POA: Diagnosis not present

## 2020-11-07 DIAGNOSIS — Z8551 Personal history of malignant neoplasm of bladder: Secondary | ICD-10-CM | POA: Insufficient documentation

## 2020-11-07 DIAGNOSIS — Z9012 Acquired absence of left breast and nipple: Secondary | ICD-10-CM | POA: Insufficient documentation

## 2020-11-07 DIAGNOSIS — J449 Chronic obstructive pulmonary disease, unspecified: Secondary | ICD-10-CM | POA: Insufficient documentation

## 2020-11-07 LAB — CMP (CANCER CENTER ONLY)
ALT: 15 U/L (ref 0–44)
AST: 24 U/L (ref 15–41)
Albumin: 3.3 g/dL — ABNORMAL LOW (ref 3.5–5.0)
Alkaline Phosphatase: 158 U/L — ABNORMAL HIGH (ref 38–126)
Anion gap: 8 (ref 5–15)
BUN: 19 mg/dL (ref 8–23)
CO2: 31 mmol/L (ref 22–32)
Calcium: 8.9 mg/dL (ref 8.9–10.3)
Chloride: 99 mmol/L (ref 98–111)
Creatinine: 0.89 mg/dL (ref 0.44–1.00)
GFR, Estimated: >60 mL/min (ref >60)
Glucose, Bld: 93 mg/dL (ref 70–99)
Potassium: 4 mmol/L (ref 3.5–5.1)
Sodium: 138 mmol/L (ref 135–145)
Total Bilirubin: 0.9 mg/dL (ref 0.3–1.2)
Total Protein: 6.7 g/dL (ref 6.5–8.1)

## 2020-11-07 LAB — CBC WITH DIFFERENTIAL (CANCER CENTER ONLY)
Abs Immature Granulocytes: 0.01 10*3/uL (ref 0.00–0.07)
Basophils Absolute: 0.1 10*3/uL (ref 0.0–0.1)
Basophils Relative: 1 %
Eosinophils Absolute: 0.1 10*3/uL (ref 0.0–0.5)
Eosinophils Relative: 1 %
HCT: 44.4 % (ref 36.0–46.0)
Hemoglobin: 14 g/dL (ref 12.0–15.0)
Immature Granulocytes: 0 %
Lymphocytes Relative: 17 %
Lymphs Abs: 1.5 10*3/uL (ref 0.7–4.0)
MCH: 30 pg (ref 26.0–34.0)
MCHC: 31.5 g/dL (ref 30.0–36.0)
MCV: 95.1 fL (ref 80.0–100.0)
Monocytes Absolute: 1 10*3/uL (ref 0.1–1.0)
Monocytes Relative: 12 %
Neutro Abs: 6 10*3/uL (ref 1.7–7.7)
Neutrophils Relative %: 69 %
Platelet Count: 258 10*3/uL (ref 150–400)
RBC: 4.67 MIL/uL (ref 3.87–5.11)
RDW: 16.1 % — ABNORMAL HIGH (ref 11.5–15.5)
WBC Count: 8.6 10*3/uL (ref 4.0–10.5)
nRBC: 0 % (ref 0.0–0.2)

## 2020-11-07 MED ORDER — ANASTROZOLE 1 MG PO TABS
1.0000 mg | ORAL_TABLET | Freq: Every day | ORAL | 3 refills | Status: DC
Start: 2020-11-07 — End: 2021-02-06

## 2020-11-07 MED ORDER — LIDOCAINE 5 % EX PTCH: 1.0000 | MEDICATED_PATCH | CUTANEOUS | 0 refills | Status: DC

## 2020-11-07 NOTE — Telephone Encounter (Signed)
Scheduled appointments per 3/7 los. Spoke to patient who is aware of appointments date and times.

## 2020-11-10 ENCOUNTER — Ambulatory Visit (INDEPENDENT_AMBULATORY_CARE_PROVIDER_SITE_OTHER): Payer: Medicare Other

## 2020-11-10 ENCOUNTER — Encounter: Payer: Self-pay | Admitting: Pulmonary Disease

## 2020-11-10 ENCOUNTER — Ambulatory Visit (INDEPENDENT_AMBULATORY_CARE_PROVIDER_SITE_OTHER): Payer: Medicare Other | Admitting: Pulmonary Disease

## 2020-11-10 ENCOUNTER — Other Ambulatory Visit: Payer: Self-pay

## 2020-11-10 VITALS — BP 88/52 | HR 75 | Temp 97.5°F | Ht 66.0 in | Wt 134.2 lb

## 2020-11-10 DIAGNOSIS — I517 Cardiomegaly: Secondary | ICD-10-CM | POA: Diagnosis not present

## 2020-11-10 DIAGNOSIS — Z170 Estrogen receptor positive status [ER+]: Secondary | ICD-10-CM | POA: Diagnosis not present

## 2020-11-10 DIAGNOSIS — I4821 Permanent atrial fibrillation: Secondary | ICD-10-CM | POA: Diagnosis not present

## 2020-11-10 DIAGNOSIS — C50412 Malignant neoplasm of upper-outer quadrant of left female breast: Secondary | ICD-10-CM

## 2020-11-10 DIAGNOSIS — J432 Centrilobular emphysema: Secondary | ICD-10-CM | POA: Diagnosis not present

## 2020-11-10 DIAGNOSIS — J90 Pleural effusion, not elsewhere classified: Secondary | ICD-10-CM

## 2020-11-10 DIAGNOSIS — J439 Emphysema, unspecified: Secondary | ICD-10-CM | POA: Diagnosis not present

## 2020-11-10 MED ORDER — DILTIAZEM HCL 30 MG PO TABS: 30.0000 mg | ORAL_TABLET | Freq: Three times a day (TID) | ORAL | 1 refills | Status: DC

## 2020-11-10 NOTE — Assessment & Plan Note (Signed)
I do not think she is having an exacerbation. She can continue on Pulmicort and Brov[place] with duo nebs for breakthrough

## 2020-11-10 NOTE — Assessment & Plan Note (Signed)
She does seem to have metastasis to her lungs and mediastinum.  She is not a great candidate to pursue invasive diagnostic procedures such as bronchoscopy/EBUS due to her severe respiratory disease.  Today she is also hypotensive.  I agree with treating her empirically especially since this tumor is ER/PR positive

## 2020-11-10 NOTE — Addendum Note (Signed)
Addended by: Merrilee Seashore on: 11/10/2020 03:18 PM   Modules accepted: Orders

## 2020-11-10 NOTE — Assessment & Plan Note (Signed)
blood pressure is low today-no evidence of infection Stop taking Cardizem CD 120 mg Prescription will be sent for Cardizem 30 mg tablets -take this 3 times daily.  Check your heart rate and blood pressure once daily Target heart rate should be 100 or lower and target systolic blood pressure should be 90 or higher

## 2020-11-10 NOTE — Patient Instructions (Signed)
  Chest x-ray today. Your blood pressure is low today. Stop taking Cardizem CD 120 mg Prescription will be sent for Cardizem 30 mg tablets -take this 3 times daily. # 90 x 1 refill  Check your heart rate and blood pressure once daily Target heart rate should be 100 or lower and target systolic blood pressure should be 90 or higher  Stay on same dose of Lasix. Stan your breathing medications

## 2020-11-10 NOTE — Progress Notes (Signed)
Subjective:    Patient ID: [name], female    DOB: Dec 24, 1934, 86 y.o.   MRN: [id number]  HPI  60 yoremote smoker for follow-up of COPDchronic respiratory failure on home O2 She smoked 40 pack years before she quit in 2004  PMH -atrial fibrillation not on anticoagulation due to prior history of intracranial hemorrhagein 2016. Bladder CA , Breast CA s/p lt total mastectomy Exacerbation 12/21/2017 and 08/22/2018 She has downsized and moved into a assisted living at [place] , husband has Parkinson's, retired Chief Executive Officer.,  Was diagnosed with AML  PET scan unfortunately shows recurrence of breast cancer in the mediastinum, reviewed oncology consultation >> Her left chest wall skin metastasis from breast cancer is better on exam.  PET 08/2020 was positive for pulmonary mets, plan to repeat CT chest in a month.  PET results >> Recommend sampling either the small RIGHT thoracic inlet lymph nodes/supraclavicular nodes or potentially bronchoscopy with sampling of the subcarinal intense reactive lymphoid tissue  Last office visit 09/2020, increased her Lasix She was switched to anastrozole 09/2020 for pruritus Arrives in a wheelchair today, accompanied by her daughter from [place] She remains on continuous oxygen 2 to 3 L She complains of soreness in her left infrascapular region She continues on Lasix which she has decreased to 40 mg twice daily, blood pressure is low today. She complains of increased swelling all the way to her thighs  Chest x-ray obtained today shows small right effusion as prior, no worsening, no new infiltrates  Significant tests/ events reviewed   PFTs 09/2020 ratio 50, FEV1 52%/1.02, no bronchodilator response, FVC 79%, DLCO 22%/1.36 Spirometry6/2019ratio 48, FEV1 37% and FVC 57% consistent with severe obstruction  Review of Systems neg for any significant sore throat, dysphagia, itching, sneezing, nasal congestion or excess/ purulent secretions,  fever, chills, sweats, unintended wt loss, pleuritic or exertional cp, hempoptysis, orthopnea pnd or change in chronic leg swelling. Also denies presyncope, palpitations, heartburn, abdominal pain, nausea, vomiting, diarrhea or change in bowel or urinary habits, dysuria,hematuria, rash, arthralgias, visual complaints, headache, numbness weakness or ataxia.      Objective:   Physical Exam  Gen. Pleasant, elderly,well-nourished, in no distress ENT - no thrush, no pallor/icterus,no post nasal drip Neck: No JVD, no thyromegaly, no carotid bruits Lungs: Kyphotic, barrel chest, no rib tenderness no use of accessory muscles, no dullness to percussion, clear without rales or rhonchi  Cardiovascular: Rhythm regular, heart sounds  normal, no murmurs or gallops, 2+  peripheral edema up to thigh Musculoskeletal: No deformities, no cyanosis or clubbing        Assessment & Plan:

## 2020-11-18 ENCOUNTER — Telehealth: Payer: Self-pay | Admitting: Cardiology

## 2020-11-18 NOTE — Telephone Encounter (Signed)
Spoke with pt, Aware of [name] recommendations. She does not feel like she needs to be seen today but if symptoms change she will call back.

## 2020-11-18 NOTE — Telephone Encounter (Signed)
Spoke with pt, dr Elsworth Soho recently changed her diltiazem from 120 mg to 30 mg three times a day to help with her bp. She reports her bp did come up to 97. She reports her hands, knees and thighs have started swelling and yesterday she took the diltiazem 120 mg and this morning her bp is 84 prior to any diltiazem. She has taken 60 mg of furosemide this morning. She reports some heart raceing and some SOB but she reports she has not done her nebulizer this morning. She reports she feels tingly all over. She does not know what medications to take now. Aware dr Stanford Breed is working in the hospital but will forward for his review. Will forward for dr Stanford Breed review

## 2020-11-18 NOTE — Telephone Encounter (Signed)
See if she can be seen in office today; not many options left for rate control; take cardizem 30 mg every 6 hours; hold for SBP<90. Kirk Ruths

## 2020-11-18 NOTE — Telephone Encounter (Signed)
Pt c/o BP issue: STAT if pt c/o blurred vision, one-sided weakness or slurred speech  1. What are your last 5 BP readings? Today  84/63 97/66 on the 16, the 15 99/27  2. Are you having any other symptoms (ex. Dizziness, headache, blurred vision, passed out)? Tingly and swelling  Lin knees and thighs hands.  3. What is your BP issue? Might be from medication changes. Patient also states she has cancer. Patient slao on oxygen.

## 2020-11-21 DIAGNOSIS — H04123 Dry eye syndrome of bilateral lacrimal glands: Secondary | ICD-10-CM | POA: Diagnosis not present

## 2020-11-21 DIAGNOSIS — B0052 Herpesviral keratitis: Secondary | ICD-10-CM | POA: Diagnosis not present

## 2020-11-21 DIAGNOSIS — H401134 Primary open-angle glaucoma, bilateral, indeterminate stage: Secondary | ICD-10-CM | POA: Diagnosis not present

## 2020-11-21 DIAGNOSIS — H524 Presbyopia: Secondary | ICD-10-CM | POA: Diagnosis not present

## 2020-11-28 ENCOUNTER — Other Ambulatory Visit: Payer: Self-pay | Admitting: Internal Medicine

## 2020-11-28 DIAGNOSIS — J418 Mixed simple and mucopurulent chronic bronchitis: Secondary | ICD-10-CM

## 2020-11-29 NOTE — Progress Notes (Deleted)
HPI: FU atrial fibrillationand CHF. Patient was admitted in June 2016 with a large right cerebellar hemorrhage. Note her only anticoagulate at that time was aspirin. The hemorrhage required surgical evacuation. Patient developed atrial fibrillation during the hospitalization with difficult to control heart rate. She was felt not to be a candidate for anticoagulation given intracranial hemorrhage. Holter 7/16 showed atrial fibrillation with elevated rate; digoxin added. Ipreviously contacted Dr Kathyrn Sheriff Neurosurgery to see if she would be a candidate for short-term anticoagulation which would then qualify her for a watchman device. He felt this could be pursued.However she declined watchman. Patient admitted April 2021 with CHF. Echocardiogram April 2021 showed vigorous LV systolic function, mild RV dysfunction, severe biatrial enlargement, moderate pulmonary hypertension. Chest x-ray March 2022 showed congestive heart failure and emphysema. Since last seen,  Current Outpatient Medications  Medication Sig Dispense Refill  . albuterol (VENTOLIN HFA) 108 (90 Base) MCG/ACT inhaler Inhale 2 puffs into the lungs every 6 (six) hours as needed for wheezing or shortness of breath. 8 g 2  . anastrozole (ARIMIDEX) 1 MG tablet Take 1 tablet (1 mg total) by mouth daily. 30 tablet 3  . atorvastatin (LIPITOR) 10 MG tablet TAKE ONE TABLET DAILY AT 6PM 90 tablet 3  . Brinzolamide-Brimonidine (SIMBRINZA) 1-0.2 % SUSP Place 1 drop into both eyes in the morning and at bedtime.    Marland Kitchen BROVANA 15 MCG/2ML NEBU USE 1 VIAL  IN  NEBULIZER TWICE  DAILY - (Morning And Evening) 120 mL 5  . budesonide (PULMICORT) 0.5 MG/2ML nebulizer solution TAKE 68m BY NEBULIZER IN THE MORNING ANDAT BEDTIME 120 mL 1  . digoxin (LANOXIN) 0.125 MG tablet Take 1 tablet (125 mcg total) by mouth daily. 90 tablet 2  . diltiazem (CARDIZEM) 30 MG tablet Take 1 tablet (30 mg total) by mouth 3 (three) times daily. 90 tablet 1  .  fluorometholone (FML) 0.1 % ophthalmic suspension Place 1 drop into both eyes daily.    . furosemide (LASIX) 20 MG tablet Take 1 tablet (20 mg total) by mouth daily. 180 tablet 3  . furosemide (LASIX) 40 MG tablet Take 1 tablet (40 mg total) by mouth 2 (two) times daily. T 270 tablet 3  . guaiFENesin (MUCINEX) 600 MG 12 hr tablet Take 600 mg by mouth daily.    .Marland Kitchenipratropium (ATROVENT) 0.02 % nebulizer solution Take 2.5 mLs (0.5 mg total) by nebulization 3 (three) times daily. DX: J44.9 225 mL 5  . latanoprost (XALATAN) 0.005 % ophthalmic solution Place 1 drop into both eyes at bedtime.    . lidocaine (LIDODERM) 5 % Place 1 patch onto the skin daily. Remove & Discard patch within 12 hours or as directed by MD 30 patch 0  . OXYGEN Inhale 2 L into the lungs continuous.     . pantoprazole (PROTONIX) 40 MG tablet TAKE ONE TABLET DAILY 90 tablet 1  . Polyethylene Glycol 3350 (MIRALAX PO) Take 17 g by mouth daily.    . potassium chloride SA (KLOR-CON) 20 MEQ tablet Take 20 meq by mouth in the morning and 10 meq in the evening    . valACYclovir (VALTREX) 1000 MG tablet Take 1,000 mg by mouth daily.     No current facility-administered medications for this visit.     Past Medical History:  Diagnosis Date  . Arthritis   . COPD [place]   . Dyspnea on exertion   . GERD (gastroesophageal reflux disease)   . Glaucoma of  both eyes   . History of atrial fibrillation without current medication 2010   POST SURG 2010--  PER PT NO ISSUES SINCE  . History of breast cancer    DX DCIS IN 2004--  S/P RIGHT MASTECTOMY , NO CHEMORADIATION---  NO RECURRENCE  . History of CHF (congestive heart failure)    POST SURG 2010  . History of hypertension   . History of shingles    02/ 2015--  back of neck and left flank--  no residual pain  . ICH (intracerebral hemorrhage) ([place]) 02/05/2015  . Nephrolithiasis   . Recurrent bladder papillary carcinoma ([place]) first dx 06/ 2014   s/p   turbt's  and instillation mitomycin c (chemo)//dx again in 2015-different type  . Stroke [facility])     Past Surgical History:  Procedure Laterality Date  . CATARACT EXTRACTION W/ INTRAOCULAR LENS  IMPLANT, BILATERAL    . CRANIECTOMY N/A 02/05/2015   Procedure: SUBOCCIPITAL CRANIECTOMY EVACUATION OF HEMATOMA;  Surgeon: Consuella Lose, MD;  Location: [place] [facility];  Service: Neurosurgery;  Laterality: N/A;  . CYSTOSCOPY W/ RETROGRADES Bilateral 08/11/2014   Procedure: CYSTOSCOPY WITH BILATERAL RETROGRADE PYELOGRAM AND MITOMYCIN INSTILLATION;  Surgeon: Alexis Frock, MD;  Location: [facility];  Service: Urology;  Laterality: Bilateral;  . CYSTOSCOPY W/ URETERAL STENT PLACEMENT Bilateral 03/26/2013   Procedure: CYSTOSCOPY WITH BILATERAL RETROGRADE PYELOGRAM/URETERAL STENT PLACEMENT;  Surgeon: Alexis Frock, MD;  Location: [facility];  Service: Urology;  Laterality: Bilateral;  . CYSTOSCOPY W/ URETERAL STENT PLACEMENT Left 05/13/2013   Procedure: CYSTOSCOPY WITH RETROGRADE PYELOGRAM/URETERAL STENT PLACEMENT STENT EXCHANGE;  Surgeon: Alexis Frock, MD;  Location: [facility];  Service: Urology;  Laterality: Left;  . PARTIAL MASTECTOMY WITH NEEDLE LOCALIZATION Right 01-14-2003   DCIS  . REMOVAL CYST LEFT HAND  2013  . TOTAL KNEE ARTHROPLASTY Right 03-22-2009  . TOTAL MASTECTOMY Right 02-03-2003   W/ SLN BX  AND  POST 03-01-2003 EVACUATION HEMATOMA  . TOTAL MASTECTOMY Left 05/04/2019   Procedure: LEFT TOTAL MASTECTOMY;  Surgeon: Fanny Skates, MD;  Location: [facility];  Service: General;  Laterality: Left;  . TRANSTHORACIC ECHOCARDIOGRAM  03-25-2009   MILD LVF/ EF 70-80%/ MILD INCREASE SYSTOLIC PULMONARY  PRESSURE  . TRANSURETHRAL RESECTION OF BLADDER TUMOR WITH GYRUS (TURBT-GYRUS) N/A 03/26/2013   Procedure: TRANSURETHRAL RESECTION OF BLADDER TUMOR WITH GYRUS (TURBT-GYRUS);  Surgeon: Alexis Frock, MD;  Location: [facility];  Service:  Urology;  Laterality: N/A;  . TRANSURETHRAL RESECTION OF BLADDER TUMOR WITH GYRUS (TURBT-GYRUS) N/A 05/13/2013   Procedure: TRANSURETHRAL RESECTION OF BLADDER TUMOR WITH GYRUS (TURBT-GYRUS)  RE-STAGING TRANSURETHRAL RESECTION OF BLADDER TUMOR, LEFT RETROGRADE PYELOGRAM AND STENT EXCHANGE;  Surgeon: Alexis Frock, MD;  Location: [facility];  Service: Urology;  Laterality: N/A;  . TRANSURETHRAL RESECTION OF BLADDER TUMOR WITH GYRUS (TURBT-GYRUS) N/A 08/11/2014   Procedure: TRANSURETHRAL RESECTION OF BLADDER TUMOR WITH GYRUS (TURBT-GYRUS);  Surgeon: Alexis Frock, MD;  Location: [facility];  Service: Urology;  Laterality: N/A;  . TUBAL LIGATION      Social History   Socioeconomic History  . Marital status: Married    Spouse name: Not on file  . Number of children: 4  . Years of education: Not on file  . Highest education level: Not on file  Occupational History  . Not on file  Tobacco Use  . Smoking status: Former Smoker    Packs/day: 1.00    Years: 40.00    Pack years: 40.00  Types: Cigarettes    Quit date: 03/20/2003    Years since quitting: 17.7  . Smokeless tobacco: Never Used  Vaping Use  . Vaping Use: Never used  Substance and Sexual Activity  . Alcohol use: No    Alcohol/week: 0.0 standard drinks  . Drug use: No  . Sexual activity: Not Currently    Partners: Male    Birth control/protection: Surgical    Comment: BTL  Other Topics Concern  . Not on file  Social History Narrative  . Not on file   Social Determinants of Health   Financial Resource Strain: Not on file  Food Insecurity: Not on file  Transportation Needs: Not on file  Physical Activity: Not on file  Stress: Not on file  Social Connections: Not on file  Intimate Partner Violence: Not on file    Family History  Problem Relation Age of Onset  . Colon cancer Mother 52  . Colon cancer Maternal Grandmother   . Stroke Maternal Grandmother   . Breast cancer Maternal  Aunt 29  . Cancer Maternal Aunt        ? ovarian  . Breast cancer Other 60       breast cancer, ovarian cancer  . Breast cancer Daughter 73  . Stroke Paternal Grandmother   . Prostate cancer Paternal Grandfather   . Heart attack Neg Hx   . Hypertension Neg Hx     ROS: no fevers or chills, productive cough, hemoptysis, dysphasia, odynophagia, melena, hematochezia, dysuria, hematuria, rash, seizure activity, orthopnea, PND, pedal edema, claudication. Remaining systems are negative.  Physical Exam: Well-developed well-nourished in no acute distress.  Skin is warm and dry.  HEENT is normal.  Neck is supple.  Chest is clear to auscultation with normal expansion.  Cardiovascular exam is regular rate and rhythm.  Abdominal exam nontender or distended. No masses palpated. Extremities show no edema. neuro grossly intact  ECG- personally reviewed  A/P  1 permanent atrial fibrillation-continue Cardizem and digoxin for rate control.  Given history of intracranial hemorrhage on aspirin alone she is not a candidate for long-term anticoagulation and previously declined watchman.  2 chronic diastolic congestive heart failure-  3 hypertension-patient's blood pressure is low.  4 hyperlipidemia-continue statin.  5 COPD-managed by pulmonary.  6 history of breast cancer-now with metastatic disease to lungs and mediastinum.  Managed by oncology.  Kirk Ruths, MD

## 2020-11-30 ENCOUNTER — Telehealth: Payer: Self-pay | Admitting: Hematology

## 2020-11-30 NOTE — Progress Notes (Signed)
HPI: FU atrial fibrillationand CHF. Patient was admitted in June 2016 with a large right cerebellar hemorrhage. Note her only anticoagulate at that time was aspirin. The hemorrhage required surgical evacuation. Patient developed atrial fibrillation during the hospitalization with difficult to control heart rate. She was felt not to be a candidate for anticoagulation given intracranial hemorrhage. Holter 7/16 showed atrial fibrillation with elevated rate; digoxin added. Ipreviously contacted Dr Kathyrn Sheriff Neurosurgery to see if she would be a candidate for short-term anticoagulation which would then qualify her for a watchman device. He felt this could be pursued.However she declined watchman. Patient admitted April 2021 with CHF. Echocardiogram April 2021 showed vigorous LV systolic function, mild RV dysfunction, severe biatrial enlargement, moderate pulmonary hypertension. Chest x-ray March 2022 showed congestive heart failure and emphysema. Since last seen, patient complains of worsening dyspnea on exertion.  She also has orthopnea and worsening bilateral lower extremity edema.  She has pleuritic pain on the left side of her chest.  Otherwise no chest pain, palpitations or syncope.  Current Outpatient Medications  Medication Sig Dispense Refill  . albuterol (VENTOLIN HFA) 108 (90 Base) MCG/ACT inhaler Inhale 2 puffs into the lungs every 6 (six) hours as needed for wheezing or shortness of breath. 8 g 2  . anastrozole (ARIMIDEX) 1 MG tablet Take 1 tablet (1 mg total) by mouth daily. 30 tablet 3  . atorvastatin (LIPITOR) 10 MG tablet TAKE ONE TABLET DAILY AT 6PM 90 tablet 3  . Brinzolamide-Brimonidine (SIMBRINZA) 1-0.2 % SUSP Place 1 drop into both eyes in the morning and at bedtime.    Marland Kitchen BROVANA 15 MCG/2ML NEBU USE 1 VIAL  IN  NEBULIZER TWICE  DAILY - (Morning And Evening) 120 mL 5  . budesonide (PULMICORT) 0.5 MG/2ML nebulizer solution TAKE 64m BY NEBULIZER IN THE MORNING ANDAT BEDTIME 120  mL 1  . digoxin (LANOXIN) 0.125 MG tablet Take 1 tablet (125 mcg total) by mouth daily. 90 tablet 2  . diltiazem (CARDIZEM) 30 MG tablet Take 1 tablet (30 mg total) by mouth 3 (three) times daily. 90 tablet 1  . fluorometholone (FML) 0.1 % ophthalmic suspension Place 1 drop into both eyes daily.    . furosemide (LASIX) 20 MG tablet Take 1 tablet (20 mg total) by mouth daily. 180 tablet 3  . furosemide (LASIX) 40 MG tablet Take 1 tablet (40 mg total) by mouth 2 (two) times daily. T 270 tablet 3  . guaiFENesin (MUCINEX) 600 MG 12 hr tablet Take 600 mg by mouth daily.    .Marland Kitchenipratropium (ATROVENT) 0.02 % nebulizer solution Take 2.5 mLs (0.5 mg total) by nebulization 3 (three) times daily. DX: J44.9 225 mL 5  . latanoprost (XALATAN) 0.005 % ophthalmic solution Place 1 drop into both eyes at bedtime.    . lidocaine (LIDODERM) 5 % Place 1 patch onto the skin daily. Remove & Discard patch within 12 hours or as directed by MD 30 patch 0  . OXYGEN Inhale 2 L into the lungs continuous.     . pantoprazole (PROTONIX) 40 MG tablet TAKE ONE TABLET DAILY 90 tablet 1  . Polyethylene Glycol 3350 (MIRALAX PO) Take 17 g by mouth daily.    . potassium chloride SA (KLOR-CON) 20 MEQ tablet Take 20 meq by mouth in the morning and 10 meq in the evening    . valACYclovir (VALTREX) 1000 MG tablet Take 1,000 mg by mouth daily.     No current facility-administered medications for this visit.  Past Medical History:  Diagnosis Date  . Arthritis   . COPD (chronic obstructive pulmonary disease) ([place])   . Dyspnea on exertion   . GERD (gastroesophageal reflux disease)   . Glaucoma of both eyes   . History of atrial fibrillation without current medication 2010   POST SURG 2010--  PER PT NO ISSUES SINCE  . History of breast cancer    DX DCIS IN 2004--  S/P RIGHT MASTECTOMY , NO CHEMORADIATION---  NO RECURRENCE  . History of CHF (congestive heart failure)    POST SURG 2010  . History of hypertension   . History of  shingles    02/ 2015--  back of neck and left flank--  no residual pain  . ICH (intracerebral hemorrhage) ([place]) 02/05/2015  . Nephrolithiasis   . Recurrent bladder papillary carcinoma ([place]) first dx 06/ 2014   s/p  turbt's  and instillation mitomycin c (chemo)//dx again in 2015-different type  . Stroke [facility])     Past Surgical History:  Procedure Laterality Date  . CATARACT EXTRACTION W/ INTRAOCULAR LENS  IMPLANT, BILATERAL    . CRANIECTOMY N/A 02/05/2015   Procedure: SUBOCCIPITAL CRANIECTOMY EVACUATION OF HEMATOMA;  Surgeon: Consuella Lose, MD;  Location: [place] [facility];  Service: Neurosurgery;  Laterality: N/A;  . CYSTOSCOPY W/ RETROGRADES Bilateral 08/11/2014   Procedure: CYSTOSCOPY WITH BILATERAL RETROGRADE PYELOGRAM AND MITOMYCIN INSTILLATION;  Surgeon: Alexis Frock, MD;  Location: [facility];  Service: Urology;  Laterality: Bilateral;  . CYSTOSCOPY W/ URETERAL STENT PLACEMENT Bilateral 03/26/2013   Procedure: CYSTOSCOPY WITH BILATERAL RETROGRADE PYELOGRAM/URETERAL STENT PLACEMENT;  Surgeon: Alexis Frock, MD;  Location: [facility];  Service: Urology;  Laterality: Bilateral;  . CYSTOSCOPY W/ URETERAL STENT PLACEMENT Left 05/13/2013   Procedure: CYSTOSCOPY WITH RETROGRADE PYELOGRAM/URETERAL STENT PLACEMENT STENT EXCHANGE;  Surgeon: Alexis Frock, MD;  Location: [facility];  Service: Urology;  Laterality: Left;  . PARTIAL MASTECTOMY WITH NEEDLE LOCALIZATION Right 01-14-2003   DCIS  . REMOVAL CYST LEFT HAND  2013  . TOTAL KNEE ARTHROPLASTY Right 03-22-2009  . TOTAL MASTECTOMY Right 02-03-2003   W/ SLN BX  AND  POST 03-01-2003 EVACUATION HEMATOMA  . TOTAL MASTECTOMY Left 05/04/2019   Procedure: LEFT TOTAL MASTECTOMY;  Surgeon: Fanny Skates, MD;  Location: [place];  Service: General;  Laterality: Left;  . TRANSTHORACIC ECHOCARDIOGRAM  03-25-2009   MILD LVF/ EF 70-80%/ MILD INCREASE SYSTOLIC PULMONARY  PRESSURE  . TRANSURETHRAL RESECTION OF  BLADDER TUMOR WITH GYRUS (TURBT-GYRUS) N/A 03/26/2013   Procedure: TRANSURETHRAL RESECTION OF BLADDER TUMOR WITH GYRUS (TURBT-GYRUS);  Surgeon: Alexis Frock, MD;  Location: [facility];  Service: Urology;  Laterality: N/A;  . TRANSURETHRAL RESECTION OF BLADDER TUMOR WITH GYRUS (TURBT-GYRUS) N/A 05/13/2013   Procedure: TRANSURETHRAL RESECTION OF BLADDER TUMOR WITH GYRUS (TURBT-GYRUS)  RE-STAGING TRANSURETHRAL RESECTION OF BLADDER TUMOR, LEFT RETROGRADE PYELOGRAM AND STENT EXCHANGE;  Surgeon: Alexis Frock, MD;  Location: [facility];  Service: Urology;  Laterality: N/A;  . TRANSURETHRAL RESECTION OF BLADDER TUMOR WITH GYRUS (TURBT-GYRUS) N/A 08/11/2014   Procedure: TRANSURETHRAL RESECTION OF BLADDER TUMOR WITH GYRUS (TURBT-GYRUS);  Surgeon: Alexis Frock, MD;  Location: [facility];  Service: Urology;  Laterality: N/A;  . TUBAL LIGATION      Social History   Socioeconomic History  . Marital status: Married    Spouse name: Not on file  . Number of children: 4  . Years of education: Not on file  . Highest education level: Not on file  Occupational History  . Not on file  Tobacco Use  . Smoking status: Former Smoker    Packs/day: 1.00    Years: 40.00    Pack years: 40.00    Types: Cigarettes    Quit date: 03/20/2003    Years since quitting: 17.7  . Smokeless tobacco: Never Used  Vaping Use  . Vaping Use: Never used  Substance and Sexual Activity  . Alcohol use: No    Alcohol/week: 0.0 standard drinks  . Drug use: No  . Sexual activity: Not Currently    Partners: Male    Birth control/protection: Surgical    Comment: BTL  Other Topics Concern  . Not on file  Social History Narrative  . Not on file   Social Determinants of Health   Financial Resource Strain: Not on file  Food Insecurity: Not on file  Transportation Needs: Not on file  Physical Activity: Not on file  Stress: Not on file  Social Connections: Not on file  Intimate  Partner Violence: Not on file    Family History  Problem Relation Age of Onset  . Colon cancer Mother 62  . Colon cancer Maternal Grandmother   . Stroke Maternal Grandmother   . Breast cancer Maternal Aunt 33  . Cancer Maternal Aunt        ? ovarian  . Breast cancer Other 60       breast cancer, ovarian cancer  . Breast cancer Daughter 11  . Stroke Paternal Grandmother   . Prostate cancer Paternal Grandfather   . Heart attack Neg Hx   . Hypertension Neg Hx     ROS: no fevers or chills, productive cough, hemoptysis, dysphasia, odynophagia, melena, hematochezia, dysuria, hematuria, rash, seizure activity, orthopnea, PND, claudication. Remaining systems are negative.  Physical Exam: Well-developed well-nourished chronically ill-appearing in no acute distress.  Skin is warm and dry.  HEENT is normal.  Normal eyelids Neck is supple.  No adenopathy noted Chest with diminished breath sounds throughout Cardiovascular exam is irregular Abdominal exam nontender or distended. No masses palpated. Extremities show 3+ edema to the thighs with chronic skin changes.  Distal pulses not palpable. neuro grossly intact  ECG-atrial fibrillation at a rate of 83, RV conduction delay, cannot rule out inferior infarct, nonspecific ST changes.  Personally reviewed  A/P  1 permanent atrial fibrillation-patient's heart rate appears to be controlled.  We will continue digoxin.  She is only taking Cardizem when her systolic blood pressure is greater than 90 and has not taken it in the past 48 hours.  Hopefully her heart rate will be controlled with digoxin alone as her blood pressure is low.  She is not on anticoagulation given history of intracranial hemorrhage on aspirin alone.  She previously declined watchman.    2 acute on chronic chronic diastolic congestive heart failure-patient is markedly volume overloaded today.  Her systolic blood pressure is low.  Will admit for IV diuresis.  Begin Lasix 80 mg  IV twice daily as tolerated by blood pressure.  Repeat echocardiogram.  Follow renal function closely.  Note she has a right pleural effusion on recent CT scan.  May benefit from thoracentesis.  3 hyperlipidemia-continue statin.  4 COPD-continue pulmonary toilet.  5 history of breast cancer-now with metastatic disease to lungs and mediastinum.  Managed by oncology.  Patient has multiple medical problems including acute on chronic diastolic congestive heart failure, severe COPD which is home O2 dependent and recurrent breast cancer.  I have discussed this with her.  She would like to be a no CODE BLUE including no defibrillation, CPR or intubation.  Hopefully she will be improved symptomatically with IV diuresis.  Kirk Ruths, MD

## 2020-11-30 NOTE — Telephone Encounter (Signed)
Moved upcoming appointment per provider's request. Patient is aware of changes.

## 2020-12-02 NOTE — Progress Notes (Signed)
[name]   [phone] [phone]   Clinic Follow up Note   Patient Care Team: Janith Lima, MD as PCP - General (Internal Medicine) Stanford Breed, Denice Bors, MD as PCP - Cardiology (Cardiology) Rockwell Germany, RN as Oncology Nurse Navigator Mauro Kaufmann, RN as Oncology Nurse Navigator Truitt Merle, MD as Consulting Physician (Hematology) Fanny Skates, MD as Consulting Physician (General Surgery) Kyung Rudd, MD as Consulting Physician (Radiation Oncology) Alexis Frock, MD as Consulting Physician (Anesthesiology) Alexis Frock, MD as Consulting Physician (Urology) [name], MD as Consulting Physician (Cardiology) Rigoberto Noel, MD as Consulting Physician (Pulmonary Disease)  Date of Service:  12/07/2020  CHIEF COMPLAINT: f/u of left breast cancer  SUMMARY OF ONCOLOGIC HISTORY: Oncology History Overview Note  Cancer Staging Malignant neoplasm of upper-outer quadrant of left breast in female, estrogen receptor positive ([place]) Staging form: Breast, AJCC 8th Edition - Clinical stage from 04/01/2019: Stage IA (cT1c, cN0, cM0, G2, ER+, PR+, HER2-) - Signed by Truitt Merle, MD on 04/07/2019 - Pathologic stage from 05/04/2019: Stage Unknown (pT2, pNX, cM0, G2, ER+, PR+, HER2-) - Signed by Truitt Merle, MD on 05/27/2019    Malignant neoplasm of upper-outer quadrant of left breast in female, estrogen receptor positive ([place])  03/30/2019 Mammogram   Left diagnostic mammogram and [place] 03/30/19  IMPRESSION The 1.6cm heterogenous focal area in the left breast at 1:00-2:00 4-6cm form the nipple, corresponding to the hard palpable area in the left breast is suspicious of malignancy, particularly lobular carcinoma.     04/01/2019 Cancer Staging   Staging form: Breast, AJCC 8th Edition - Clinical stage from 04/01/2019: Stage IA (cT1c, cN0, cM0, G2, ER+, PR+, HER2-) - Signed by Truitt Merle, MD on 04/07/2019   04/01/2019 Initial Biopsy   Diagnosis 04/01/19 Breast,  left, needle core biopsy, 2 o'clock, 5cmfn - INVASIVE MAMMARY CARCINOMA, SEE COMMENT. - MAMMARY CARCINOMA IN SITU.   04/01/2019 Receptors her2   The tumor cells are NEGATIVE for Her2 (1+). Estrogen Receptor: 100%, POSITIVE, STRONG STAINING INTENSITY Progesterone Receptor: 30%, POSITIVE, STRONG STAINING INTENSITY Proliferation Marker Ki67: 20%   04/03/2019 Initial Diagnosis   Malignant neoplasm of upper-outer quadrant of left breast in female, estrogen receptor positive (H[facility])    Genetic Testing   Negative genetic testing. No pathogenic variants identified on the [facility] Breast Cancer STAT Panel + Common Hereditary Cancers Panel. The STAT Breast cancer panel offered by [facility] includes sequencing and rearrangement analysis for the following 9 genes:  ATM, BRCA1, BRCA2, CDH1, CHEK2, PALB2, PTEN, STK11 and TP53.  The Common Hereditary Cancers Panel offered by [facility] includes sequencing and/or deletion duplication testing of the following 47 genes: APC, ATM, AXIN2, BARD1, BMPR1A, BRCA1, BRCA2, BRIP1, CDH1, CDKN2A (p14ARF), CDKN2A (p16INK4a), CKD4, CHEK2, CTNNA1, DICER1, EPCAM (Deletion/duplication testing only), GREM1 (promoter region deletion/duplication testing only), KIT, MEN1, MLH1, MSH2, MSH3, MSH6, MUTYH, NBN, NF1, NHTL1, PALB2, PDGFRA, PMS2, POLD1, POLE, PTEN, RAD50, RAD51C, RAD51D,  SDHB, SDHC, SDHD, SMAD4, SMARCA4. STK11, TP53, TSC1, TSC2, and VHL.  The following genes were evaluated for sequence changes only: SDHA and HOXB13 c.251G>A variant only. The report date is 04/17/2019.    05/04/2019 Surgery    LEFT TOTAL MASTECTOMY by Dr. Dalbert Batman 05/04/19   05/04/2019 Pathology Results   Diagnosis 05/04/19 1. Breast, excision - INVASIVE LOBULAR CARCINOMA, NOTTINGHAM GRADE 2 - SEE COMMENT 2. Breast, simple mastectomy, Left total - MULTIFOCAL, INVASIVE LOBULAR CARCINOMA, NOTTINGHAM GRADE 2 OF 3, 2.1 CM - MARGINS UNINVOLVED BY CARCINOMA (0.2 CM; POSTERIOR MARGIN) -  LYMPHOVASCULAR SPACE INVASION  PRESENT - PREVIOUS BIOPSY SITE CHANGES PRESENT - SEBORRHEIC KERATOSIS - SEE ONCOLOGY TABLE AND COMMENT BELOW   05/04/2019 Cancer Staging   Staging form: Breast, AJCC 8th Edition - Pathologic stage from 05/04/2019: Stage Unknown (pT2, pNX, cM0, G2, ER+, PR+, HER2-) - Signed by Truitt Merle, MD on 05/27/2019   05/31/2020 Breast US   Left Breast US 05/31/20 Multiple, raised, hard, pink lesions in the skin of th left chest surrounding the mastectomy scar. There is one inferior and lateral to the scar which has a corresponding 1.1cm vascular mass in teh skin at [place]. Dermatology consult for skin biopsy of the lesion is recommended to exclude recurrent breast cancer to the skin.    06/17/2020 Relapse/Recurrence   PUNCH BIOPSY by Dermatologist   Diagnosis 1. Skin Biopsy-(P), A) right inferior lateral antecubital shave & ED&C SEBORRHEIC KERATOSIS, IRRITATED  2. Skin Biopsy-(P), B) left inferior central chest punch  POORLY DIFFERENTIATED ADENOCARCINOMA CONSISTENT WITH BREAST ORIGIN INVOLVING THE DERMIS, SEE DESCRIPTION   HER2 NEGATIVE  ER Positive >95% PR NEGATIVE     06/27/2020 -  Anti-estrogen oral therapy   First-line Exemestane 55m once daily starting 06/25/20. Switched to Anastrozole in 09/2020 given skin pruritus      07/08/2020 PET scan   IMPRESSION: 1. No metabolic activity associated with nodularity along the mastectomy scar in the LEFT chest wall. 2. Two hypermetabolic RIGHT supraclavicular lymph nodes. Hypermetabolic activity may relate to RIGHT arm COVID vaccination of 10/ 03/2020. Estradiol FDG PET scan may differentiate versus attention on follow-up versus biopsy. 3. No evidence distant metastatic disease. 4. Moderate RIGHT pleural effusion and basilar atelectasis.   08/05/2020 PET scan   IMPRESSION: 1. No estradiol receptor radiotracer accumulation within the RIGHT supraclavicular lymph nodes which were hypermetabolic on comparison FDG PET scan. Favor activity related to COVID  vaccination. 2. Linear band cutaneous radiotracer activity within the LEFT chest wall presumably relates to estrogen positive punch biopsies performed by dermatologist. 3. No clear evidence of metastatic estrogen positive breast cancer on the remaining whole-body scan. 4. Intense radiotracer activity within the mediastinum and thoracic inlet without clear corresponding CT findings. Favor benign physiologic activity. 5. Intense radiotracer activity associated with LEFT RIGHT lower lobe atelectasis is also favored benign physiologic activity. 6. Stable bilateral pleural effusions.   ADDENDUM: Although there is scant lymphoid tissue and small nodes associated with the intense mediastinal F 18 estradiol activity, upon conferring with MD vendor expert, concern exists for invasive lobular carcinoma to the mediastinum.   08/05/2020 PET scan   [facility] PET IMPRESSION: 1. No estradiol receptor radiotracer accumulation within the RIGHT supraclavicular lymph nodes which were hypermetabolic on comparison FDG PET scan. Favor activity related to COVID vaccination. 2. Linear band cutaneous radiotracer activity within the LEFT chest wall presumably relates to estrogen positive punch biopsies performed by dermatologist. 3. No clear evidence of metastatic estrogen positive breast cancer on the remaining whole-body scan. 4. Intense radiotracer activity within the mediastinum and thoracic inlet without clear corresponding CT findings. Favor benign physiologic activity. 5. Intense radiotracer activity associated with LEFT RIGHT lower lobe atelectasis is also favored benign physiologic activity. 6. Stable bilateral pleural effusions.     ADDENDUM: Although there is scant lymphoid tissue and small nodes associated with the intense mediastinal F 18 estradiol activity, upon conferring with MD vendor expert, concern exists for invasive lobular carcinoma to the mediastinum.   Recommend sampling  either the small RIGHT thoracic inlet lymph nodes/supraclavicular nodes or potentially bronchoscopy with sampling  of the subcarinal intense reactive lymphoid tissue.      CURRENT THERAPY:  First-line Exemestane 17m once daily starting 06/25/20. Switched to Anastrozole in 09/2020 given skin pruritus   INTERVAL HISTORY:  BMARDELLE PANDOLFIis here for a follow up. She was last seen by me 11/07/20. She presents to the clinic with her son. She notes she feels worse, but mostly stable. She notes harder time breathing. She notes still being on 2 liter oxygen. She is able to ambulate by cane in some places of home. She can go to bathroom herself. She can do light meal fixes. She notes her appetite is fair, with is stable. She notes LE edema in her upper legs. She notes left chest wall pain after laying down for a while on her left side.  She notes she is tolerating anastrozole. She notes joint stiffness in her hands. She notes difficulty using her eye drops.    REVIEW OF SYSTEMS:   Constitutional: Denies fevers, chills or abnormal weight loss (+) Fair appetite  Eyes: Denies blurriness of vision Ears, nose, mouth, throat, and face: Denies mucositis or sore throat Respiratory: (+) SOB, on supplemental oxygen 2 liters (+) Left wall chest pain.  Cardiovascular: Denies palpitation, chest discomfort (+) lower extremity swelling, skin redness Gastrointestinal:  Denies nausea, heartburn or change in bowel habits Skin: Denies abnormal skin rashes MSK: (+) Joint stiffness  Lymphatics: Denies new lymphadenopathy or easy bruising Neurological:Denies numbness, tingling or new weaknesses Behavioral/Psych: Mood is stable, no new changes  All other systems were reviewed with the patient and are negative.  MEDICAL HISTORY:  Past Medical History:  Diagnosis Date  . Arthritis   . COPD (chronic obstructive pulmonary disease) ([place]   . Dyspnea on exertion   . GERD (gastroesophageal reflux disease)   .  Glaucoma of both eyes   . History of atrial fibrillation without current medication 2010   POST SURG 2010--  PER PT NO ISSUES SINCE  . History of breast cancer    DX DCIS IN 2004--  S/P RIGHT MASTECTOMY , NO CHEMORADIATION---  NO RECURRENCE  . History of CHF (congestive heart failure)    POST SURG 2010  . History of hypertension   . History of shingles    02/ 2015--  back of neck and left flank--  no residual pain  . ICH (intracerebral hemorrhage) ([facility] 02/05/2015  . Nephrolithiasis   . Recurrent bladder papillary carcinoma ([facility] first dx 06/ 2014   s/p  turbt's  and instillation mitomycin c (chemo)//dx again in 2015-different type  . Stroke ([facility]     SURGICAL HISTORY: Past Surgical History:  Procedure Laterality Date  . CATARACT EXTRACTION W/ INTRAOCULAR LENS  IMPLANT, BILATERAL    . CRANIECTOMY N/A 02/05/2015   Procedure: SUBOCCIPITAL CRANIECTOMY EVACUATION OF HEMATOMA;  Surgeon: NConsuella Lose MD;  [facility];  Service: Neurosurgery;  Laterality: N/A;  . CYSTOSCOPY W/ RETROGRADES Bilateral 08/11/2014   Procedure: CYSTOSCOPY WITH BILATERAL RETROGRADE PYELOGRAM AND MITOMYCIN INSTILLATION;  Surgeon: TAlexis Frock MD;  [facility]  Service: Urology;  Laterality: Bilateral;  . CYSTOSCOPY W/ URETERAL STENT PLACEMENT Bilateral 03/26/2013   Procedure: CYSTOSCOPY WITH BILATERAL RETROGRADE PYELOGRAM/URETERAL STENT PLACEMENT;  Surgeon: TAlexis Frock MD;  [facility]  Service: Urology;  Laterality: Bilateral;  . CYSTOSCOPY W/ URETERAL STENT PLACEMENT Left 05/13/2013   Procedure: CYSTOSCOPY WITH RETROGRADE PYELOGRAM/URETERAL STENT PLACEMENT STENT EXCHANGE;  Surgeon: TAlexis Frock MD;  [facility]  Service: Urology;  Laterality: Left;  . PARTIAL MASTECTOMY WITH NEEDLE LOCALIZATION Right 01-14-2003   DCIS  . REMOVAL CYST LEFT HAND  2013  . TOTAL KNEE ARTHROPLASTY Right 03-22-2009  . TOTAL MASTECTOMY  Right 02-03-2003   W/ SLN BX  AND  POST 03-01-2003 EVACUATION HEMATOMA  . TOTAL MASTECTOMY Left 05/04/2019   Procedure: LEFT TOTAL MASTECTOMY;  Surgeon: Fanny Skates, MD;  Location: [place];  Service: General;  Laterality: Left;  . TRANSTHORACIC ECHOCARDIOGRAM  03-25-2009   MILD LVF/ EF 70-80%/ MILD INCREASE SYSTOLIC PULMONARY  PRESSURE  . TRANSURETHRAL RESECTION OF BLADDER TUMOR WITH GYRUS (TURBT-GYRUS) N/A 03/26/2013   Procedure: TRANSURETHRAL RESECTION OF BLADDER TUMOR WITH GYRUS (TURBT-GYRUS);  Surgeon: Alexis Frock, MD;  Location[facility];  Service: Urology;  Laterality: N/A;  . TRANSURETHRAL RESECTION OF BLADDER TUMOR WITH GYRUS (TURBT-GYRUS) N/A 05/13/2013   Procedure: TRANSURETHRAL RESECTION OF BLADDER TUMOR WITH GYRUS (TURBT-GYRUS)  RE-STAGING TRANSURETHRAL RESECTION OF BLADDER TUMOR, LEFT RETROGRADE PYELOGRAM AND STENT EXCHANGE;  Surgeon: Alexis Frock, MD;  Location: [facility];  Service: Urology;  Laterality: N/A;  . TRANSURETHRAL RESECTION OF BLADDER TUMOR WITH GYRUS (TURBT-GYRUS) N/A 08/11/2014   Procedure: TRANSURETHRAL RESECTION OF BLADDER TUMOR WITH GYRUS (TURBT-GYRUS);  Surgeon: Alexis Frock, MD;  [facility];  Service: Urology;  Laterality: N/A;  . TUBAL LIGATION      I have reviewed the social history and family history with the patient and they are unchanged from previous note.  ALLERGIES:  is allergic to heparin and morphine and related.  MEDICATIONS:  Current Outpatient Medications  Medication Sig Dispense Refill  . albuterol (VENTOLIN HFA) 108 (90 Base) MCG/ACT inhaler Inhale 2 puffs into the lungs every 6 (six) hours as needed for wheezing or shortness of breath. 8 g 2  . anastrozole (ARIMIDEX) 1 MG tablet Take 1 tablet (1 mg total) by mouth daily. 30 tablet 3  . atorvastatin (LIPITOR) 10 MG tablet TAKE ONE TABLET DAILY AT 6PM 90 tablet 3  . Brinzolamide-Brimonidine (SIMBRINZA) 1-0.2 % SUSP Place 1 drop  into both eyes in the morning and at bedtime.    Marland Kitchen BROVANA 15 MCG/2ML NEBU USE 1 VIAL  IN  NEBULIZER TWICE  DAILY - (Morning And Evening) 120 mL 5  . budesonide (PULMICORT) 0.5 MG/2ML nebulizer solution TAKE 79m BY NEBULIZER IN THE MORNING ANDAT BEDTIME 120 mL 1  . digoxin (LANOXIN) 0.125 MG tablet Take 1 tablet (125 mcg total) by mouth daily. 90 tablet 2  . diltiazem (CARDIZEM) 30 MG tablet Take 1 tablet (30 mg total) by mouth 3 (three) times daily. 90 tablet 1  . fluorometholone (FML) 0.1 % ophthalmic suspension Place 1 drop into both eyes daily.    . furosemide (LASIX) 20 MG tablet Take 1 tablet (20 mg total) by mouth daily. 180 tablet 3  . furosemide (LASIX) 40 MG tablet Take 1 tablet (40 mg total) by mouth 2 (two) times daily. T 270 tablet 3  . guaiFENesin (MUCINEX) 600 MG 12 hr tablet Take 600 mg by mouth daily.    .Marland Kitchenipratropium (ATROVENT) 0.02 % nebulizer solution Take 2.5 mLs (0.5 mg total) by nebulization 3 (three) times daily. DX: J44.9 225 mL 5  . latanoprost (XALATAN) 0.005 % ophthalmic solution Place 1 drop into both eyes at bedtime.    . lidocaine (LIDODERM) 5 % Place 1 patch onto the skin daily. Remove & Discard patch within 12 hours or as directed by MD 30 patch 0  . OXYGEN Inhale 2  L into the lungs continuous.     . pantoprazole (PROTONIX) 40 MG tablet TAKE ONE TABLET DAILY 90 tablet 1  . Polyethylene Glycol 3350 (MIRALAX PO) Take 17 g by mouth daily.    . potassium chloride SA (KLOR-CON) 20 MEQ tablet Take 20 meq by mouth in the morning and 10 meq in the evening    . valACYclovir (VALTREX) 1000 MG tablet Take 1,000 mg by mouth daily.     No current facility-administered medications for this visit.    PHYSICAL EXAMINATION: ECOG PERFORMANCE STATUS: 3 - Symptomatic, >50% confined to bed  Vitals:   12/07/20 1225  BP: (!) 89/58  Pulse: 60  Resp: 18  Temp: (!) 95.4 F (35.2 C)  SpO2: (!) 83%   Filed Weights   12/07/20 1225  Weight: 137 lb 3.2 oz (62.2 kg)     GENERAL:alert, no distress and comfortable SKIN: skin color, texture, turgor are normal, no rashes or significant lesions except a few light pink skin rash on left chest wall along her mastectomy line, and  (+)skin ulcer of right foot medial side.  EYES: normal, Conjunctiva are pink and non-injected, sclera clear  NECK: supple, thyroid normal size, non-tender, without nodularity LYMPH:  no palpable lymphadenopathy in the cervical, axillary  LUNGS: clear to auscultation and percussion with normal breathing effort (+) On supplemental oxygen  HEART: regular rate & rhythm and no murmurs (+) lower extremity edema of upper legs with skin erythema ABDOMEN:abdomen soft, non-tender and normal bowel sounds Musculoskeletal:no cyanosis of digits and no clubbing  NEURO: alert & oriented x 3 with fluent speech, no focal motor/sensory deficits  LABORATORY DATA:  I have reviewed the data as listed CBC Latest Ref Rng & Units 12/06/2020 11/07/2020 09/09/2020  WBC 4.0 - 10.5 K/uL 8.8 8.6 8.2  Hemoglobin 12.0 - 15.0 g/dL 14.2 14.0 13.9  Hematocrit 36.0 - 46.0 % 45.2 44.4 44.1  Platelets 150 - 400 K/uL 288 258 265     CMP Latest Ref Rng & Units 12/06/2020 11/07/2020 09/27/2020  Glucose 70 - 99 mg/dL 121(H) 93 78  BUN 8 - 23 mg/dL _0 Creatinine 0.44 - 1.00 mg/dL 0.91 0.89 0.90  Sodium 135 - 145 mmol/L 142 138 141  Potassium 3.5 - 5.1 mmol/L 4.0 4.0 4.4  Chloride 98 - 111 mmol/L 102 99 99  CO2 22 - 32 mmol/L _1 Calcium 8.9 - 10.3 mg/dL 8.6(L) 8.9 9.1  Total Protein 6.5 - 8.1 g/dL 6.6 6.7 -  Total Bilirubin 0.3 - 1.2 mg/dL 1.1 0.9 -  Alkaline Phos 38 - 126 U/L 180(H) 158(H) -  AST 15 - 41 U/L 25 24 -  ALT 0 - 44 U/L 18 15 -      RADIOGRAPHIC STUDIES: I have personally reviewed the radiological images as listed and agreed with the findings in the report. CT Chest Wo Contrast  Result Date: 12/07/2020 CLINICAL DATA:  Non contrast per MD Pt.states hx of breast ca, pt.c/o shortness of breath,  lt.mastectomy Dyspnea, chronic, unclear etiology EXAM: CT CHEST WITHOUT CONTRAST TECHNIQUE: Multidetector CT imaging of the chest was performed following the standard protocol without IV contrast. COMPARISON:  PET-CT 07/08/2020 FINDINGS: Cardiovascular: Heart is enlarged. Atria are particularly enlarged Coronary artery calcification and aortic atherosclerotic calcification. Valvular calcification noted. Mediastinum/Nodes: No axillary or supraclavicular adenopathy. No mediastinal or hilar adenopathy. No pericardial fluid. Esophagus normal. Lungs/Pleura: Large layering RIGHT pleural effusion. Passive atelectasis lobe. Small LEFT effusion. Effusion volume is similar to slightly  increased from comparison PET-CT scan. Extensive centrilobular emphysema the upper lobes. No suspicious nodularity. Upper Abdomen: Limited view of the liver, kidneys, pancreas are unremarkable. Normal adrenal glands. Musculoskeletal: No aggressive osseous lesion. IMPRESSION: 1. Persistent large layering RIGHT pleural effusion. 2. No suspicious nodularity. 3. Centrilobular emphysema in the upper lobes. 4. Extensive coronary artery calcification valvular calcification. Cardiomegaly with enlarged atria. Electronically Signed   By: Suzy Bouchard M.D.   On: 12/07/2020 12:47     ASSESSMENT & PLAN:  [name] is a 86 y.o. female with   1.Malignant neoplasm of upper-outer quadrant of left breast,lobularcarcinoma, Stage 1A pT2cN0M0, ER/PR+, HER2-,Grade II, chest wall skin recurrence 9/2021ER+/PR+/HER2- -She was diagnosed in 03/2019. She underwent left totalmastectomyby Dr Dalbert Batman on 05/04/19. -Genetictesting was negative for pathogenetic mutations -Oncotype was not recommend due to her advanced age andmedical comorbidities -Givenearly stage disease,post mastectomy radiation was not recommended.  -Patientinitiallydeclined antiestrogen therapy due to age and medical co-morbidities -She unfortunately developed local skin  recurrence from breast cancerin 06/2020.Punch biopsy was done by her dermatologiston 06/17/20. Her 08/05/20 [facility] PET scanshowedextensive hypermetabolicuptake in mediastinal nodes and alonglungs bases, R>L, which are likely thoracic metastasis, given the septicity of [place] PET. -I started her on first line Exemestane 7m once daily starting 06/27/20.Due to skin pruritus(likely reaction) I switched her to Anastrozole in 09/2020. She is tolerating well.  -I personally reviewed and discussed her CT Chest from 12/06/20 which showed stable/slihgtly incfease right pleural effusion, no other pulmonary lesion or enlarged nodes. I discussed Radiology has not read her scan yet, so if there are differing results, I will update her. I discussed using cerianna PET scan next time, she declined due to poor toleration to PET.  -Labs reviewed, CBC and CMP WNL except BG 121, CA 8.6, albumin 3.3, alk Phos 180. Will continue Anastrozole. I do not plan to restart more intensive treatment such as CDK4/6 inhibitor given her comorbidities and tolerance.  -F/u in 2 months    2. Skin itching, resolved after switching exemestane to anastrozole   3. Pleural effusion  -She has b/l effusion on 08/05/20 PET scan.  -She has had thoracentesis in the past due to her COPD and Afib. She remains on continuous supplemental oxygen.  -Will monitor, will order thoracentesis if indicated.  -Her 08/05/20 [facility] PET showed extensive hypermetabolicuptake in mediastinal nodes and alonglungs bases, R>L, which are likely thoracic metastasis. -She notes being more SOB for the past 1-2 months, but remains on same level supplemental oxygen. -I recommend she f/u with Dr AEartha Inchand Dr CStanford Breed   4. Comorbidities:H/o of ICH and stroke, S/p craniotomy in 02/2015,HTN, Afib, COPD, Shingles and GERD and arthritis,H/o of Recurrent bladder papillary carcinomaDx in 03/2013 and 08/2014 -Managed by cardiologist Dr. CStanford Breed PCP and  Pulmonologist Dr Alva,Urologist Dr. MTresa Moore-She has been hypotensive in clinic since March 2022. I also recommend she check her BP at home daily and f/u with cardiologist. She notes her cardiologist instructed her to hold Lasix with BP under 100.    5. Left upper Back Pain -She notes Left upper back pain of her back for the past 2 months. This has impacted her sleep.  -She notes attributes part of this to the abnormal curvature of her spine. She has no skeletal mets on her 08/05/20 PET scan.  -I discussed calling Lidocaine patch and she can use heating pad.  -will monitor.    PLAN: -CT chest scan reviewed -Continue anastrozole  -Lab and F/u in 2 months  No problem-specific Assessment & Plan notes found for this encounter.   Orders Placed This Encounter  Procedures  . CA 27.29    Standing Status:   Standing    Number of Occurrences:   15    Standing Expiration Date:   12/07/2021   All questions were answered. The patient knows to call the clinic with any problems, questions or concerns. No barriers to learning was detected. The total time spent in the appointment was 30 minutes.     Truitt Merle, MD 12/07/2020   I, Joslyn Devon, am acting as scribe for Truitt Merle, MD.   I have reviewed the above documentation for accuracy and completeness, and I agree with the above.

## 2020-12-05 ENCOUNTER — Ambulatory Visit: Payer: Medicare Other | Admitting: Cardiology

## 2020-12-05 ENCOUNTER — Other Ambulatory Visit: Payer: Self-pay | Admitting: Internal Medicine

## 2020-12-05 ENCOUNTER — Other Ambulatory Visit: Payer: Medicare Other

## 2020-12-06 ENCOUNTER — Inpatient Hospital Stay: Payer: Medicare Other | Attending: Hematology

## 2020-12-06 ENCOUNTER — Other Ambulatory Visit: Payer: Self-pay

## 2020-12-06 ENCOUNTER — Encounter (HOSPITAL_COMMUNITY): Payer: Self-pay

## 2020-12-06 ENCOUNTER — Ambulatory Visit (HOSPITAL_COMMUNITY)
Admission: RE | Admit: 2020-12-06 | Discharge: 2020-12-06 | Disposition: A | Discharge status: Home / Self Care | Payer: Medicare Other | Source: Ambulatory Visit | Attending: Hematology | Admitting: Hematology

## 2020-12-06 DIAGNOSIS — C50412 Malignant neoplasm of upper-outer quadrant of left female breast: Secondary | ICD-10-CM | POA: Insufficient documentation

## 2020-12-06 DIAGNOSIS — R0602 Shortness of breath: Secondary | ICD-10-CM | POA: Diagnosis not present

## 2020-12-06 DIAGNOSIS — Z170 Estrogen receptor positive status [ER+]: Secondary | ICD-10-CM

## 2020-12-06 LAB — CMP (CANCER CENTER ONLY)
ALT: 18 U/L (ref 0–44)
AST: 25 U/L (ref 15–41)
Albumin: 3.3 g/dL — ABNORMAL LOW (ref 3.5–5.0)
Alkaline Phosphatase: 180 U/L — ABNORMAL HIGH (ref 38–126)
Anion gap: 12 (ref 5–15)
BUN: 21 mg/dL (ref 8–23)
CO2: 28 mmol/L (ref 22–32)
Calcium: 8.6 mg/dL — ABNORMAL LOW (ref 8.9–10.3)
Chloride: 102 mmol/L (ref 98–111)
Creatinine: 0.91 mg/dL (ref 0.44–1.00)
GFR, Estimated: >60 mL/min (ref >60)
Glucose, Bld: 121 mg/dL — ABNORMAL HIGH (ref 70–99)
Potassium: 4 mmol/L (ref 3.5–5.1)
Sodium: 142 mmol/L (ref 135–145)
Total Bilirubin: 1.1 mg/dL (ref 0.3–1.2)
Total Protein: 6.6 g/dL (ref 6.5–8.1)

## 2020-12-06 LAB — CBC WITH DIFFERENTIAL (CANCER CENTER ONLY)
Abs Immature Granulocytes: 0.02 10*3/uL (ref 0.00–0.07)
Basophils Absolute: 0.1 10*3/uL (ref 0.0–0.1)
Basophils Relative: 1 %
Eosinophils Absolute: 0.1 10*3/uL (ref 0.0–0.5)
Eosinophils Relative: 1 %
HCT: 45.2 % (ref 36.0–46.0)
Hemoglobin: 14.2 g/dL (ref 12.0–15.0)
Immature Granulocytes: 0 %
Lymphocytes Relative: 15 %
Lymphs Abs: 1.3 10*3/uL (ref 0.7–4.0)
MCH: 29.5 pg (ref 26.0–34.0)
MCHC: 31.4 g/dL (ref 30.0–36.0)
MCV: 93.8 fL (ref 80.0–100.0)
Monocytes Absolute: 0.9 10*3/uL (ref 0.1–1.0)
Monocytes Relative: 10 %
Neutro Abs: 6.5 10*3/uL (ref 1.7–7.7)
Neutrophils Relative %: 73 %
Platelet Count: 288 10*3/uL (ref 150–400)
RBC: 4.82 MIL/uL (ref 3.87–5.11)
RDW: 16.3 % — ABNORMAL HIGH (ref 11.5–15.5)
WBC Count: 8.8 10*3/uL (ref 4.0–10.5)
nRBC: 0 % (ref 0.0–0.2)

## 2020-12-07 ENCOUNTER — Encounter: Payer: Self-pay | Admitting: Hematology

## 2020-12-07 ENCOUNTER — Inpatient Hospital Stay (HOSPITAL_BASED_OUTPATIENT_CLINIC_OR_DEPARTMENT_OTHER): Payer: Medicare Other | Admitting: Hematology

## 2020-12-07 ENCOUNTER — Other Ambulatory Visit: Payer: Self-pay

## 2020-12-07 VITALS — BP 89/58 | HR 60 | Temp 95.4°F | Resp 18 | Wt 137.2 lb

## 2020-12-07 DIAGNOSIS — C672 Malignant neoplasm of lateral wall of bladder: Secondary | ICD-10-CM | POA: Diagnosis not present

## 2020-12-07 DIAGNOSIS — Z170 Estrogen receptor positive status [ER+]: Secondary | ICD-10-CM | POA: Diagnosis not present

## 2020-12-07 DIAGNOSIS — C50412 Malignant neoplasm of upper-outer quadrant of left female breast: Secondary | ICD-10-CM

## 2020-12-07 DIAGNOSIS — J432 Centrilobular emphysema: Secondary | ICD-10-CM | POA: Diagnosis not present

## 2020-12-08 ENCOUNTER — Emergency Department (HOSPITAL_COMMUNITY)
Admission: EM | Admit: 2020-12-08 | Discharge: 2020-12-08 | Discharge status: Absent without leave | Payer: Medicare Other

## 2020-12-08 ENCOUNTER — Inpatient Hospital Stay (HOSPITAL_COMMUNITY)
Admission: AD | Admit: 2020-12-08 | Discharge: 2020-12-14 | DRG: 291 | Disposition: A | Discharge status: Home / Self Care | Payer: Medicare Other | Source: Ambulatory Visit | Attending: Cardiology | Admitting: Cardiology

## 2020-12-08 ENCOUNTER — Ambulatory Visit (INDEPENDENT_AMBULATORY_CARE_PROVIDER_SITE_OTHER): Payer: Medicare Other | Admitting: Cardiology

## 2020-12-08 ENCOUNTER — Encounter: Payer: Self-pay | Admitting: Cardiology

## 2020-12-08 ENCOUNTER — Other Ambulatory Visit: Payer: Self-pay

## 2020-12-08 VITALS — BP 80/50 | HR 83 | Ht 66.0 in | Wt 136.6 lb

## 2020-12-08 DIAGNOSIS — Z9221 Personal history of antineoplastic chemotherapy: Secondary | ICD-10-CM | POA: Diagnosis not present

## 2020-12-08 DIAGNOSIS — Z79899 Other long term (current) drug therapy: Secondary | ICD-10-CM

## 2020-12-08 DIAGNOSIS — I272 Pulmonary hypertension, unspecified: Secondary | ICD-10-CM

## 2020-12-08 DIAGNOSIS — Z170 Estrogen receptor positive status [ER+]: Secondary | ICD-10-CM

## 2020-12-08 DIAGNOSIS — Z9012 Acquired absence of left breast and nipple: Secondary | ICD-10-CM

## 2020-12-08 DIAGNOSIS — I2723 Pulmonary hypertension due to lung diseases and hypoxia: Secondary | ICD-10-CM | POA: Diagnosis not present

## 2020-12-08 DIAGNOSIS — R0689 Other abnormalities of breathing: Secondary | ICD-10-CM

## 2020-12-08 DIAGNOSIS — I2781 Cor pulmonale (chronic): Secondary | ICD-10-CM | POA: Diagnosis present

## 2020-12-08 DIAGNOSIS — I614 Nontraumatic intracerebral hemorrhage in cerebellum: Secondary | ICD-10-CM | POA: Diagnosis present

## 2020-12-08 DIAGNOSIS — Z853 Personal history of malignant neoplasm of breast: Secondary | ICD-10-CM

## 2020-12-08 DIAGNOSIS — C50919 Malignant neoplasm of unspecified site of unspecified female breast: Secondary | ICD-10-CM | POA: Diagnosis not present

## 2020-12-08 DIAGNOSIS — C7801 Secondary malignant neoplasm of right lung: Secondary | ICD-10-CM | POA: Diagnosis not present

## 2020-12-08 DIAGNOSIS — J449 Chronic obstructive pulmonary disease, unspecified: Secondary | ICD-10-CM | POA: Diagnosis not present

## 2020-12-08 DIAGNOSIS — I10 Essential (primary) hypertension: Secondary | ICD-10-CM

## 2020-12-08 DIAGNOSIS — Z8551 Personal history of malignant neoplasm of bladder: Secondary | ICD-10-CM | POA: Diagnosis not present

## 2020-12-08 DIAGNOSIS — C781 Secondary malignant neoplasm of mediastinum: Secondary | ICD-10-CM | POA: Diagnosis present

## 2020-12-08 DIAGNOSIS — I5033 Acute on chronic diastolic (congestive) heart failure: Secondary | ICD-10-CM | POA: Diagnosis not present

## 2020-12-08 DIAGNOSIS — I4891 Unspecified atrial fibrillation: Secondary | ICD-10-CM | POA: Diagnosis present

## 2020-12-08 DIAGNOSIS — Z7951 Long term (current) use of inhaled steroids: Secondary | ICD-10-CM

## 2020-12-08 DIAGNOSIS — Z20822 Contact with and (suspected) exposure to covid-19: Secondary | ICD-10-CM | POA: Diagnosis not present

## 2020-12-08 DIAGNOSIS — C7802 Secondary malignant neoplasm of left lung: Secondary | ICD-10-CM | POA: Diagnosis present

## 2020-12-08 DIAGNOSIS — Z66 Do not resuscitate: Secondary | ICD-10-CM | POA: Diagnosis present

## 2020-12-08 DIAGNOSIS — E785 Hyperlipidemia, unspecified: Secondary | ICD-10-CM | POA: Diagnosis present

## 2020-12-08 DIAGNOSIS — Z96651 Presence of right artificial knee joint: Secondary | ICD-10-CM | POA: Diagnosis present

## 2020-12-08 DIAGNOSIS — J9811 Atelectasis: Secondary | ICD-10-CM | POA: Diagnosis not present

## 2020-12-08 DIAGNOSIS — K219 Gastro-esophageal reflux disease without esophagitis: Secondary | ICD-10-CM | POA: Diagnosis present

## 2020-12-08 DIAGNOSIS — Z87891 Personal history of nicotine dependence: Secondary | ICD-10-CM

## 2020-12-08 DIAGNOSIS — H409 Unspecified glaucoma: Secondary | ICD-10-CM | POA: Diagnosis present

## 2020-12-08 DIAGNOSIS — Z8673 Personal history of transient ischemic attack (TIA), and cerebral infarction without residual deficits: Secondary | ICD-10-CM

## 2020-12-08 DIAGNOSIS — R0602 Shortness of breath: Secondary | ICD-10-CM | POA: Diagnosis not present

## 2020-12-08 DIAGNOSIS — I110 Hypertensive heart disease with heart failure: Principal | ICD-10-CM | POA: Diagnosis present

## 2020-12-08 DIAGNOSIS — C50412 Malignant neoplasm of upper-outer quadrant of left female breast: Secondary | ICD-10-CM

## 2020-12-08 DIAGNOSIS — J90 Pleural effusion, not elsewhere classified: Secondary | ICD-10-CM

## 2020-12-08 DIAGNOSIS — I517 Cardiomegaly: Secondary | ICD-10-CM | POA: Diagnosis not present

## 2020-12-08 DIAGNOSIS — Z9981 Dependence on supplemental oxygen: Secondary | ICD-10-CM

## 2020-12-08 DIAGNOSIS — R0902 Hypoxemia: Secondary | ICD-10-CM

## 2020-12-08 DIAGNOSIS — J9611 Chronic respiratory failure with hypoxia: Secondary | ICD-10-CM | POA: Diagnosis not present

## 2020-12-08 DIAGNOSIS — I4821 Permanent atrial fibrillation: Secondary | ICD-10-CM

## 2020-12-08 DIAGNOSIS — R079 Chest pain, unspecified: Secondary | ICD-10-CM | POA: Diagnosis not present

## 2020-12-08 DIAGNOSIS — R0600 Dyspnea, unspecified: Secondary | ICD-10-CM

## 2020-12-08 LAB — COMPREHENSIVE METABOLIC PANEL
ALT: 17 U/L (ref 0–44)
AST: 29 U/L (ref 15–41)
Albumin: 3.2 g/dL — ABNORMAL LOW (ref 3.5–5.0)
Alkaline Phosphatase: 149 U/L — ABNORMAL HIGH (ref 38–126)
Anion gap: 7 (ref 5–15)
BUN: 24 mg/dL — ABNORMAL HIGH (ref 8–23)
CO2: 29 mmol/L (ref 22–32)
Calcium: 8.8 mg/dL — ABNORMAL LOW (ref 8.9–10.3)
Chloride: 101 mmol/L (ref 98–111)
Creatinine, Ser: 1.03 mg/dL — ABNORMAL HIGH (ref 0.44–1.00)
GFR, Estimated: 54 mL/min — ABNORMAL LOW (ref >60)
Glucose, Bld: 109 mg/dL — ABNORMAL HIGH (ref 70–99)
Potassium: 4.2 mmol/L (ref 3.5–5.1)
Sodium: 137 mmol/L (ref 135–145)
Total Bilirubin: 1.4 mg/dL — ABNORMAL HIGH (ref 0.3–1.2)
Total Protein: 6.3 g/dL — ABNORMAL LOW (ref 6.5–8.1)

## 2020-12-08 LAB — BRAIN NATRIURETIC PEPTIDE: B Natriuretic Peptide: 1996.9 pg/mL — ABNORMAL HIGH (ref 0.0–100.0)

## 2020-12-08 LAB — CBC
HCT: 45.1 % (ref 36.0–46.0)
Hemoglobin: 14.4 g/dL (ref 12.0–15.0)
MCH: 29.9 pg (ref 26.0–34.0)
MCHC: 31.9 g/dL (ref 30.0–36.0)
MCV: 93.6 fL (ref 80.0–100.0)
Platelets: 303 10*3/uL (ref 150–400)
RBC: 4.82 MIL/uL (ref 3.87–5.11)
RDW: 16.4 % — ABNORMAL HIGH (ref 11.5–15.5)
WBC: 9.2 10*3/uL (ref 4.0–10.5)
nRBC: 0 % (ref 0.0–0.2)

## 2020-12-08 LAB — RESP PANEL BY RT-PCR (FLU A&B, COVID) ARPGX2
Influenza A by PCR: NEGATIVE
Influenza B by PCR: NEGATIVE
SARS Coronavirus 2 by RT PCR: NEGATIVE

## 2020-12-08 LAB — MAGNESIUM: Magnesium: 2.1 mg/dL (ref 1.7–2.4)

## 2020-12-08 LAB — MRSA PCR SCREENING: MRSA by PCR: NEGATIVE

## 2020-12-08 LAB — TSH: TSH: 2.44 u[IU]/mL (ref 0.350–4.500)

## 2020-12-08 MED ORDER — IPRATROPIUM BROMIDE 0.02 % IN SOLN
0.5000 mg | Freq: Three times a day (TID) | RESPIRATORY_TRACT | Status: DC
  Administered 2020-12-09: 0.5 mg via RESPIRATORY_TRACT
  Filled 2020-12-08: qty 2.5

## 2020-12-08 MED ORDER — ATORVASTATIN CALCIUM 10 MG PO TABS: 10.0000 mg | ORAL_TABLET | Freq: Every day | ORAL | Status: DC

## 2020-12-08 MED ORDER — NITROGLYCERIN 0.4 MG SL SUBL: 0.4000 mg | SUBLINGUAL_TABLET | SUBLINGUAL | Status: DC | PRN

## 2020-12-08 MED ORDER — HEPARIN SODIUM (PORCINE) 5000 UNIT/ML IJ SOLN
5000.0000 [IU] | Freq: Three times a day (TID) | INTRAMUSCULAR | Status: DC
  Administered 2020-12-08 – 2020-12-14 (×16): 5000 [IU] via SUBCUTANEOUS
  Filled 2020-12-08 (×17): qty 1

## 2020-12-08 MED ORDER — LATANOPROST 0.005 % OP SOLN
1.0000 [drp] | Freq: Every day | OPHTHALMIC | Status: DC
  Filled 2020-12-08: qty 2.5

## 2020-12-08 MED ORDER — FUROSEMIDE 10 MG/ML IJ SOLN
80.0000 mg | Freq: Two times a day (BID) | INTRAMUSCULAR | Status: DC
  Administered 2020-12-08 – 2020-12-13 (×10): 80 mg via INTRAVENOUS
  Filled 2020-12-08 (×10): qty 8

## 2020-12-08 MED ORDER — BUDESONIDE 0.5 MG/2ML IN SUSP
0.5000 mg | Freq: Every day | RESPIRATORY_TRACT | Status: DC
  Administered 2020-12-09 – 2020-12-12 (×4): 0.5 mg via RESPIRATORY_TRACT
  Filled 2020-12-08 (×5): qty 2

## 2020-12-08 MED ORDER — ATORVASTATIN CALCIUM 10 MG PO TABS
10.0000 mg | ORAL_TABLET | Freq: Every day | ORAL | Status: DC
  Administered 2020-12-09 – 2020-12-14 (×6): 10 mg via ORAL
  Filled 2020-12-08 (×6): qty 1

## 2020-12-08 MED ORDER — ONDANSETRON HCL 4 MG/2ML IJ SOLN: 4.0000 mg | Freq: Four times a day (QID) | INTRAMUSCULAR | Status: DC | PRN

## 2020-12-08 MED ORDER — FLUOROMETHOLONE 0.1 % OP SUSP
1.0000 [drp] | Freq: Every day | OPHTHALMIC | Status: DC
  Administered 2020-12-09 – 2020-12-14 (×5): 1 [drp] via OPHTHALMIC
  Filled 2020-12-08: qty 5

## 2020-12-08 MED ORDER — ANASTROZOLE 1 MG PO TABS
1.0000 mg | ORAL_TABLET | Freq: Every day | ORAL | Status: DC
  Filled 2020-12-08: qty 1

## 2020-12-08 MED ORDER — ALBUTEROL SULFATE HFA 108 (90 BASE) MCG/ACT IN AERS
2.0000 | INHALATION_SPRAY | Freq: Four times a day (QID) | RESPIRATORY_TRACT | Status: DC | PRN
  Filled 2020-12-08: qty 6.7

## 2020-12-08 MED ORDER — ANASTROZOLE 1 MG PO TABS
1.0000 mg | ORAL_TABLET | Freq: Every day | ORAL | Status: DC
  Administered 2020-12-09 – 2020-12-14 (×6): 1 mg via ORAL
  Filled 2020-12-08 (×6): qty 1

## 2020-12-08 MED ORDER — ENOXAPARIN SODIUM 40 MG/0.4ML ~~LOC~~ SOLN: 40.0000 mg | SUBCUTANEOUS | Status: DC

## 2020-12-08 MED ORDER — DIGOXIN 125 MCG PO TABS
125.0000 ug | ORAL_TABLET | Freq: Every day | ORAL | Status: DC
  Filled 2020-12-08: qty 1

## 2020-12-08 MED ORDER — ARFORMOTEROL TARTRATE 15 MCG/2ML IN NEBU
15.0000 ug | INHALATION_SOLUTION | Freq: Two times a day (BID) | RESPIRATORY_TRACT | Status: DC
  Administered 2020-12-09 – 2020-12-14 (×11): 15 ug via RESPIRATORY_TRACT
  Filled 2020-12-08 (×12): qty 2

## 2020-12-08 MED ORDER — PANTOPRAZOLE SODIUM 40 MG PO TBEC
40.0000 mg | DELAYED_RELEASE_TABLET | Freq: Every day | ORAL | Status: DC
  Administered 2020-12-09 – 2020-12-14 (×6): 40 mg via ORAL
  Filled 2020-12-08 (×7): qty 1

## 2020-12-08 MED ORDER — ACETAMINOPHEN 325 MG PO TABS
650.0000 mg | ORAL_TABLET | ORAL | Status: DC | PRN
  Administered 2020-12-08 – 2020-12-09 (×2): 650 mg via ORAL
  Filled 2020-12-08 (×2): qty 2

## 2020-12-08 NOTE — H&P (Signed)
HPI: FU atrial fibrillationand CHF. Patient was admitted in June 2016 with a large right cerebellar hemorrhage. Note her only anticoagulate at that time was aspirin. The hemorrhage required surgical evacuation. Patient developed atrial fibrillation during the hospitalization with difficult to control heart rate. She was felt not to be a candidate for anticoagulation given intracranial hemorrhage. Holter 7/16 showed atrial fibrillation with elevated rate; digoxin added. Ipreviously contacted Dr Kathyrn Sheriff Neurosurgery to see if she would be a candidate for short-term anticoagulation which would then qualify her for a watchman device. He felt this could be pursued.However she declined watchman. Patient admitted April 2021 with CHF. Echocardiogram April 2021 showed vigorous LV systolic function, mild RV dysfunction, severe biatrial enlargement, moderate pulmonary hypertension. Chest x-ray March 2022 showed congestive heart failure and emphysema. Since last seen, patient complains of worsening dyspnea on exertion.  She also has orthopnea and worsening bilateral lower extremity edema.  She has pleuritic pain on the left side of her chest.  Otherwise no chest pain, palpitations or syncope.        Current Outpatient Medications  Medication Sig Dispense Refill  . albuterol (VENTOLIN HFA) 108 (90 Base) MCG/ACT inhaler Inhale 2 puffs into the lungs every 6 (six) hours as needed for wheezing or shortness of breath. 8 g 2  . anastrozole (ARIMIDEX) 1 MG tablet Take 1 tablet (1 mg total) by mouth daily. 30 tablet 3  . atorvastatin (LIPITOR) 10 MG tablet TAKE ONE TABLET DAILY AT 6PM 90 tablet 3  . Brinzolamide-Brimonidine (SIMBRINZA) 1-0.2 % SUSP Place 1 drop into both eyes in the morning and at bedtime.    Marland Kitchen BROVANA 15 MCG/2ML NEBU USE 1 VIAL  IN  NEBULIZER TWICE  DAILY - (Morning And Evening) 120 mL 5  . budesonide (PULMICORT) 0.5 MG/2ML nebulizer solution TAKE 96m BY NEBULIZER IN THE MORNING  ANDAT BEDTIME 120 mL 1  . digoxin (LANOXIN) 0.125 MG tablet Take 1 tablet (125 mcg total) by mouth daily. 90 tablet 2  . diltiazem (CARDIZEM) 30 MG tablet Take 1 tablet (30 mg total) by mouth 3 (three) times daily. 90 tablet 1  . fluorometholone (FML) 0.1 % ophthalmic suspension Place 1 drop into both eyes daily.    . furosemide (LASIX) 20 MG tablet Take 1 tablet (20 mg total) by mouth daily. 180 tablet 3  . furosemide (LASIX) 40 MG tablet Take 1 tablet (40 mg total) by mouth 2 (two) times daily. T 270 tablet 3  . guaiFENesin (MUCINEX) 600 MG 12 hr tablet Take 600 mg by mouth daily.    .Marland Kitchenipratropium (ATROVENT) 0.02 % nebulizer solution Take 2.5 mLs (0.5 mg total) by nebulization 3 (three) times daily. DX: J44.9 225 mL 5  . latanoprost (XALATAN) 0.005 % ophthalmic solution Place 1 drop into both eyes at bedtime.    . lidocaine (LIDODERM) 5 % Place 1 patch onto the skin daily. Remove & Discard patch within 12 hours or as directed by MD 30 patch 0  . OXYGEN Inhale 2 L into the lungs continuous.     . pantoprazole (PROTONIX) 40 MG tablet TAKE ONE TABLET DAILY 90 tablet 1  . Polyethylene Glycol 3350 (MIRALAX PO) Take 17 g by mouth daily.    . potassium chloride SA (KLOR-CON) 20 MEQ tablet Take 20 meq by mouth in the morning and 10 meq in the evening    . valACYclovir (VALTREX) 1000 MG tablet Take 1,000 mg by mouth daily.     No current facility-administered  Past Medical History:  Diagnosis Date  . Arthritis   . COPD (chronic obstructive pulmonary disease) (HCC)   . Dyspnea on exertion   . GERD (gastroesophageal reflux disease)   . Glaucoma of both eyes   . History of atrial fibrillation without current medication 2010   POST SURG 2010--  PER PT NO ISSUES SINCE  . History of breast cancer    DX DCIS IN 2004--  S/P RIGHT MASTECTOMY , NO CHEMORADIATION---  NO RECURRENCE  . History of CHF (congestive heart failure)    POST SURG 2010  . History of hypertension   . History of  shingles    02/ 2015--  back of neck and left flank--  no residual pain  . ICH (intracerebral hemorrhage) (HCC) 02/05/2015  . Nephrolithiasis   . Recurrent bladder papillary carcinoma (HCC) first dx 06/ 2014   s/p  turbt's  and instillation mitomycin c (chemo)//dx again in 2015-different type  . Stroke (HCC)     Past Surgical History:  Procedure Laterality Date  . CATARACT EXTRACTION W/ INTRAOCULAR LENS  IMPLANT, BILATERAL    . CRANIECTOMY N/A 02/05/2015   Procedure: SUBOCCIPITAL CRANIECTOMY EVACUATION OF HEMATOMA;  Surgeon: Neelesh Nundkumar, MD;  Location: [facility];  Service: Neurosurgery;  Laterality: N/A;  . CYSTOSCOPY W/ RETROGRADES Bilateral 08/11/2014   Procedure: CYSTOSCOPY WITH BILATERAL RETROGRADE PYELOGRAM AND MITOMYCIN INSTILLATION;  Surgeon: Theodore Manny, MD;  Location: [facility];  Service: Urology;  Laterality: Bilateral;  . CYSTOSCOPY W/ URETERAL STENT PLACEMENT Bilateral 03/26/2013   Procedure: CYSTOSCOPY WITH BILATERAL RETROGRADE PYELOGRAM/URETERAL STENT PLACEMENT;  Surgeon: Theodore Manny, MD;  Location: [facility];  Service: Urology;  Laterality: Bilateral;  . CYSTOSCOPY W/ URETERAL STENT PLACEMENT Left 05/13/2013   Procedure: CYSTOSCOPY WITH RETROGRADE PYELOGRAM/URETERAL STENT PLACEMENT STENT EXCHANGE;  Surgeon: Theodore Manny, MD;  Location: [facility];  Service: Urology;  Laterality: Left;  . PARTIAL MASTECTOMY WITH NEEDLE LOCALIZATION Right 01-14-2003   DCIS  . REMOVAL CYST LEFT HAND  2013  . TOTAL KNEE ARTHROPLASTY Right 03-22-2009  . TOTAL MASTECTOMY Right 02-03-2003   W/ SLN BX  AND  POST 03-01-2003 EVACUATION HEMATOMA  . TOTAL MASTECTOMY Left 05/04/2019   Procedure: LEFT TOTAL MASTECTOMY;  Surgeon: Ingram, Haywood, MD;  Location: [facility];  Service: General;  Laterality: Left;  . TRANSTHORACIC ECHOCARDIOGRAM  03-25-2009   MILD LVF/ EF 70-80%/ MILD INCREASE SYSTOLIC PULMONARY  PRESSURE  . TRANSURETHRAL RESECTION OF  BLADDER TUMOR WITH GYRUS (TURBT-GYRUS) N/A 03/26/2013   Procedure: TRANSURETHRAL RESECTION OF BLADDER TUMOR WITH GYRUS (TURBT-GYRUS);  Surgeon: Theodore Manny, MD;  Location: [facility];  Service: Urology;  Laterality: N/A;  . TRANSURETHRAL RESECTION OF BLADDER TUMOR WITH GYRUS (TURBT-GYRUS) N/A 05/13/2013   Procedure: TRANSURETHRAL RESECTION OF BLADDER TUMOR WITH GYRUS (TURBT-GYRUS)  RE-STAGING TRANSURETHRAL RESECTION OF BLADDER TUMOR, LEFT RETROGRADE PYELOGRAM AND STENT EXCHANGE;  Surgeon: Theodore Manny, MD;  Location: [facility];  Service: Urology;  Laterality: N/A;  . TRANSURETHRAL RESECTION OF BLADDER TUMOR WITH GYRUS (TURBT-GYRUS) N/A 08/11/2014   Procedure: TRANSURETHRAL RESECTION OF BLADDER TUMOR WITH GYRUS (TURBT-GYRUS);  Surgeon: Theodore Manny, MD;  Location: [facility];  Service: Urology;  Laterality: N/A;  . TUBAL LIGATION      Social History   Socioeconomic History  . Marital status: Married    Spouse name: Not on file  . Number of children: 4  . Years of education: Not on file  . Highest education level: Not on file    Occupational History  . Not on file  Tobacco Use  . Smoking status: Former Smoker    Packs/day: 1.00    Years: 40.00    Pack years: 40.00    Types: Cigarettes    Quit date: 03/20/2003    Years since quitting: 17.7  . Smokeless tobacco: Never Used  Vaping Use  . Vaping Use: Never used  Substance and Sexual Activity  . Alcohol use: No    Alcohol/week: 0.0 standard drinks  . Drug use: No  . Sexual activity: Not Currently    Partners: Male    Birth control/protection: Surgical    Comment: BTL  Other Topics Concern  . Not on file  Social History Narrative  . Not on file   Social Determinants of Health   Financial Resource Strain: Not on file  Food Insecurity: Not on file  Transportation Needs: Not on file  Physical Activity: Not on file  Stress: Not on file  Social Connections: Not on file  Intimate  Partner Violence: Not on file    Family History  Problem Relation Age of Onset  . Colon cancer Mother 60  . Colon cancer Maternal Grandmother   . Stroke Maternal Grandmother   . Breast cancer Maternal Aunt 40  . Cancer Maternal Aunt        ? ovarian  . Breast cancer Other 60       breast cancer, ovarian cancer  . Breast cancer Daughter 47  . Stroke Paternal Grandmother   . Prostate cancer Paternal Grandfather   . Heart attack Neg Hx   . Hypertension Neg Hx     ROS: no fevers or chills, productive cough, hemoptysis, dysphasia, odynophagia, melena, hematochezia, dysuria, hematuria, rash, seizure activity, orthopnea, PND, claudication. Remaining systems are negative.  Physical Exam: Well-developed well-nourished chronically ill-appearing in no acute distress.  Skin is warm and dry.  HEENT is normal.  Normal eyelids Neck is supple.  No adenopathy noted Chest with diminished breath sounds throughout Cardiovascular exam is irregular Abdominal exam nontender or distended. No masses palpated. Extremities show 3+ edema to the thighs with chronic skin changes.  Distal pulses not palpable. neuro grossly intact  ECG-atrial fibrillation at a rate of 83, RV conduction delay, cannot rule out inferior infarct, nonspecific ST changes.  Personally reviewed  A/P  1 permanent atrial fibrillation-patient's heart rate appears to be controlled.  We will continue digoxin.  She is only taking Cardizem when her systolic blood pressure is greater than 90 and has not taken it in the past 48 hours.  Hopefully her heart rate will be controlled with digoxin alone as her blood pressure is low.  She is not on anticoagulation given history of intracranial hemorrhage on aspirin alone.  She previously declined watchman.    2 acute on chronic chronic diastolic congestive heart failure-patient is markedly volume overloaded today.  Her systolic blood pressure is low.  Will admit for IV diuresis.  Begin Lasix 80 mg  IV twice daily as tolerated by blood pressure.  Repeat echocardiogram.  Follow renal function closely.  Note she has a right pleural effusion on recent CT scan.  May benefit from thoracentesis.  3 hyperlipidemia-continue statin.  4 COPD-continue pulmonary toilet.  5 history of breast cancer-now with metastatic disease to lungs and mediastinum.  Managed by oncology.  Patient has multiple medical problems including acute on chronic diastolic congestive heart failure, severe COPD which is home O2 dependent and recurrent breast cancer.  I have discussed this with her.    including acute on chronic diastolic congestive heart failure, severe COPD which is home O2 dependent and recurrent breast cancer.  I have discussed this with her.  She would like to be a no CODE BLUE including no defibrillation, CPR or intubation.  Hopefully she will be improved symptomatically with IV diuresis.  Kirk Ruths, MD

## 2020-12-09 ENCOUNTER — Encounter (HOSPITAL_COMMUNITY): Payer: Self-pay | Admitting: Cardiology

## 2020-12-09 ENCOUNTER — Inpatient Hospital Stay (HOSPITAL_COMMUNITY): Payer: Medicare Other

## 2020-12-09 ENCOUNTER — Other Ambulatory Visit: Payer: Self-pay

## 2020-12-09 DIAGNOSIS — I4891 Unspecified atrial fibrillation: Secondary | ICD-10-CM

## 2020-12-09 DIAGNOSIS — I5033 Acute on chronic diastolic (congestive) heart failure: Secondary | ICD-10-CM | POA: Diagnosis not present

## 2020-12-09 LAB — BASIC METABOLIC PANEL
Anion gap: 11 (ref 5–15)
BUN: 24 mg/dL — ABNORMAL HIGH (ref 8–23)
CO2: 26 mmol/L (ref 22–32)
Calcium: 8.9 mg/dL (ref 8.9–10.3)
Chloride: 101 mmol/L (ref 98–111)
Creatinine, Ser: 0.94 mg/dL (ref 0.44–1.00)
GFR, Estimated: >60 mL/min (ref >60)
Glucose, Bld: 123 mg/dL — ABNORMAL HIGH (ref 70–99)
Potassium: 4 mmol/L (ref 3.5–5.1)
Sodium: 138 mmol/L (ref 135–145)

## 2020-12-09 LAB — ECHOCARDIOGRAM COMPLETE
AR max vel: 1.69 cm2
AV Area VTI: 1.54 cm2
AV Area mean vel: 1.39 cm2
AV Mean grad: 7 mmHg
AV Peak grad: 11.6 mmHg
Ao pk vel: 1.7 m/s
Height: 65 in
S' Lateral: 2.5 cm
Weight: 2148.16 oz

## 2020-12-09 LAB — CBC
HCT: 43.8 % (ref 36.0–46.0)
Hemoglobin: 14.2 g/dL (ref 12.0–15.0)
MCH: 30.2 pg (ref 26.0–34.0)
MCHC: 32.4 g/dL (ref 30.0–36.0)
MCV: 93.2 fL (ref 80.0–100.0)
Platelets: 336 10*3/uL (ref 150–400)
RBC: 4.7 MIL/uL (ref 3.87–5.11)
RDW: 16.4 % — ABNORMAL HIGH (ref 11.5–15.5)
WBC: 9.2 10*3/uL (ref 4.0–10.5)
nRBC: 0 % (ref 0.0–0.2)

## 2020-12-09 MED ORDER — DIGOXIN 125 MCG PO TABS
125.0000 ug | ORAL_TABLET | ORAL | Status: DC
  Administered 2020-12-09 – 2020-12-14 (×6): 125 ug via ORAL
  Filled 2020-12-09 (×6): qty 1

## 2020-12-09 MED ORDER — POLYETHYLENE GLYCOL 3350 17 G PO PACK
17.0000 g | PACK | Freq: Every day | ORAL | Status: DC
  Administered 2020-12-09 – 2020-12-12 (×3): 17 g via ORAL
  Filled 2020-12-09 (×4): qty 1

## 2020-12-09 MED ORDER — IPRATROPIUM BROMIDE 0.02 % IN SOLN
0.5000 mg | Freq: Two times a day (BID) | RESPIRATORY_TRACT | Status: DC
  Administered 2020-12-09 – 2020-12-14 (×10): 0.5 mg via RESPIRATORY_TRACT
  Filled 2020-12-09 (×11): qty 2.5

## 2020-12-09 MED ORDER — POTASSIUM CHLORIDE CRYS ER 20 MEQ PO TBCR
40.0000 meq | EXTENDED_RELEASE_TABLET | Freq: Once | ORAL | Status: AC
  Administered 2020-12-09: 40 meq via ORAL
  Filled 2020-12-09: qty 2

## 2020-12-09 MED ORDER — LIDOCAINE 5 % EX PTCH
2.0000 | MEDICATED_PATCH | CUTANEOUS | Status: DC
  Administered 2020-12-09: 2 via TRANSDERMAL
  Filled 2020-12-09 (×6): qty 2

## 2020-12-09 NOTE — Plan of Care (Signed)

## 2020-12-09 NOTE — Progress Notes (Signed)
  Echocardiogram 2D Echocardiogram has been performed.  [name] F 12/09/2020, 11:39 AM

## 2020-12-09 NOTE — Plan of Care (Signed)
  Problem: Education: Goal: Knowledge of General Education information will improve Description: Including pain rating scale, medication(s)/side effects and non-pharmacologic comfort measures Outcome: Progressing   Problem: Clinical Measurements: Goal: Ability to maintain clinical measurements within normal limits will improve Outcome: Progressing   Problem: Clinical Measurements: Goal: Respiratory complications will improve Outcome: Progressing   Problem: Activity: Goal: Risk for activity intolerance will decrease Outcome: Progressing   Problem: Nutrition: Goal: Adequate nutrition will be maintained Outcome: Progressing   Problem: Elimination: Goal: Will not experience complications related to bowel motility Outcome: Progressing   Problem: Pain Managment: Goal: General experience of comfort will improve Outcome: Progressing   Problem: Skin Integrity: Goal: Risk for impaired skin integrity will decrease Outcome: Progressing

## 2020-12-09 NOTE — Progress Notes (Signed)
Cardiology Progress Note  Patient ID: HIBBA SCHRAM MRN: [id number] DOB: [birth year] Date of Encounter: 12/09/2020  Primary Cardiologist: Kirk Ruths, MD  Subjective   Chief Complaint: Shortness of breath  HPI: Admitted overnight with volume overload.  600 cc urine output.  Needs more aggressive diuresis.  On 4 L of oxygen with sats in the low 90s.  ROS:  All other ROS reviewed and negative. Pertinent positives noted in the HPI.     Inpatient Medications  Scheduled Meds: . anastrozole  1 mg Oral Daily  . arformoterol  15 mcg Nebulization BID  . atorvastatin  10 mg Oral Daily  . budesonide  0.5 mg Nebulization Daily  . digoxin  125 mcg Oral Daily  . fluorometholone  1 drop Both Eyes Daily  . furosemide  80 mg Intravenous BID  . heparin  5,000 Units Subcutaneous Q8H  . ipratropium  0.5 mg Nebulization TID  . latanoprost  1 drop Both Eyes QHS  . pantoprazole  40 mg Oral Daily   Continuous Infusions:  PRN Meds: acetaminophen, albuterol, nitroGLYCERIN, ondansetron (ZOFRAN) IV   Vital Signs   Vitals:   12/09/20 0313 12/09/20 0454 12/09/20 0744 12/09/20 0747  BP: 113/73  110/67   Pulse: 80  81   Resp: (!) 21  (!) 22   Temp: (!) 97.5 F (36.4 C)  97.6 F (36.4 C)   TempSrc: Oral  Oral   SpO2: 90%  97% 93%  Weight:  60.9 kg    Height:  _0  (1.651 m)      Intake/Output Summary (Last 24 hours) at 12/09/2020 0751 Last data filed at 12/09/2020 8315 Gross per 24 hour  Intake 120 ml  Output 600 ml  Net -480 ml   Last 3 Weights 12/09/2020 12/08/2020 12/07/2020  Weight (lbs) 134 lb 4.2 oz 136 lb 9.6 oz 137 lb 3.2 oz  Weight (kg) 60.9 kg 61.961 kg 62.234 kg      Telemetry  Overnight telemetry shows atrial fibrillation with heart rate in 80s, which I personally reviewed.   ECG  The most recent ECG shows atrial fibrillation heart rate 76, LVH by voltage with anterolateral ST depressions which is likely related to repolarization abnormality, which I personally reviewed.    Physical Exam   Vitals:   12/09/20 0313 12/09/20 0454 12/09/20 0744 12/09/20 0747  BP: 113/73  110/67   Pulse: 80  81   Resp: (!) 21  (!) 22   Temp: (!) 97.5 F (36.4 C)  97.6 F (36.4 C)   TempSrc: Oral  Oral   SpO2: 90%  97% 93%  Weight:  60.9 kg    Height:  _1  (1.651 m)       Intake/Output Summary (Last 24 hours) at 12/09/2020 0751 Last data filed at 12/09/2020 1761 Gross per 24 hour  Intake 120 ml  Output 600 ml  Net -480 ml    Last 3 Weights 12/09/2020 12/08/2020 12/07/2020  Weight (lbs) 134 lb 4.2 oz 136 lb 9.6 oz 137 lb 3.2 oz  Weight (kg) 60.9 kg 61.961 kg 62.234 kg    Body mass index is 22.34 kg/m.  General: Ill-appearing, tachypnea noted Head: Atraumatic, normal size  Eyes: PEERLA, EOMI  Neck: Supple, JVD 15 to 17 cm of water Endocrine: No thryomegaly Cardiac: Normal S1, S2; irregular rhythm, no murmurs Lungs: Very diminished breath sounds in the right lung base, crackles noted Abd: Soft, nontender, no hepatomegaly  Ext: 2+ pitting edema up to the thighs Musculoskeletal: No  deformities, BUE and BLE strength normal and equal Skin: Warm and dry, no rashes   Neuro: Alert and oriented to person, place, time, and situation, CNII-XII grossly intact, no focal deficits  Psych: Normal mood and affect   Labs  High Sensitivity Troponin:  No results for input(s): TROPONINIHS in the last 720 hours.   Cardiac EnzymesNo results for input(s): TROPONINI in the last 168 hours. No results for input(s): TROPIPOC in the last 168 hours.  Chemistry Recent Labs  Lab 12/06/20 1413 12/08/20 2114 12/09/20 0028  NA 142 137 138  K 4.0 4.2 4.0  CL 102 101 101  CO2 _0 GLUCOSE 121* 109* 123*  BUN 21 24* 24*  CREATININE 0.91 1.03* 0.94  CALCIUM 8.6* 8.8* 8.9  PROT 6.6 6.3*  --   ALBUMIN 3.3* 3.2*  --   AST 25 29  --   ALT 18 17  --   ALKPHOS 180* 149*  --   BILITOT 1.1 1.4*  --   GFRNONAA >60 54* >60  ANIONGAP _1 Hematology Recent Labs  Lab 12/06/20 1413  12/08/20 2114 12/09/20 0028  WBC 8.8 9.2 9.2  RBC 4.82 4.82 4.70  HGB 14.2 14.4 14.2  HCT 45.2 45.1 43.8  MCV 93.8 93.6 93.2  MCH 29.5 29.9 30.2  MCHC 31.4 31.9 32.4  RDW 16.3* 16.4* 16.4*  PLT 288 303 336   BNP Recent Labs  Lab 12/08/20 2114  BNP 1,996.9*    DDimer No results for input(s): DDIMER in the last 168 hours.   Radiology  No results found.  Cardiac Studies  Echo pending  Patient Profile  [name] is a 86 y.o. female with atrial fibrillation (not on anticoagulation due to prior intracranial hemorrhage), HFpEF, COPD, breast cancer with recurrence (metastatic disease to lungs and mediastinum), hyperlipidemia who was admitted on 12/08/2020 with acute on chronic diastolic heart failure.  Assessment & Plan   1.  Acute on chronic diastolic heart failure -Grossly volume overloaded.  Recent chest CT with right pleural effusion.  Needs aggressive diuresis. -BNP elevated.  Volume overload on exam. -Continue with Lasix 80 mg IV twice daily. -Suspect she will end up needing right thoracentesis.  We will see how she does with IV diuresis before we do that.  May need repeat lung imaging.  2.  Right pleural effusion -Suspect this is related to heart failure and metastatic breast cancer.  We will continue with IV diuresis today. -She may ultimately need a thoracentesis especially due to the fact that her PET shows mediastinal uptake in the right side lymph nodes.   -We will see how she does with diuresis first.  3.  Permanent atrial fibrillation, not on anticoagulation due to prior intracranial hemorrhage -Not a candidate for anticoagulation. -Only taking digoxin for rate control.  Continue this. -Apparently she was only taking diltiazem as needed. -Rates are acceptable.  4.  COPD -Home requirement is 2.5 L. -Continue home inhalers. -Current issue appears to be diastolic heart failure.  5.  Metastatic breast cancer/right pleural effusion -Recurrent ER/PR  positive/HER 2 - breast cancer -Appears to have metastatic disease to the lungs and mediastinum -Per review of oncology notes, they only plan to pursue antiestrogen therapy anastrozole.  Continue this. -We will see how she does with diuresis.  I suspect she will end up needing a right thoracentesis.  However, given her gross volume overload I favor diuretic therapy first.  6.  History of intracranial hemorrhage -  Not a candidate for anticoagulation  FEN -no IVF -heart health diet -dvt ppx: heparin  -code: She expresses a desire to be DNR/DNI.  We will honor this.  For questions or updates, please contact [place] Please consult [email or web address] for contact info under   Time Spent with Patient: I have spent a total of 35 minutes with patient reviewing hospital notes, telemetry, EKGs, labs and examining the patient as well as establishing an assessment and plan that was discussed with the patient.  > 50% of time was spent in direct patient care.    Signed, Addison Naegeli. Audie Box, MD, [place]  12/09/2020 7:51 AM

## 2020-12-10 ENCOUNTER — Inpatient Hospital Stay (HOSPITAL_COMMUNITY): Payer: Medicare Other

## 2020-12-10 DIAGNOSIS — I5033 Acute on chronic diastolic (congestive) heart failure: Secondary | ICD-10-CM | POA: Diagnosis not present

## 2020-12-10 NOTE — Progress Notes (Signed)
Cardiology Progress Note  Patient ID: [name] MRN: [id number] DOB: 1935-02-01 Date of Encounter: 12/10/2020  Primary Cardiologist: Kirk Ruths, MD  Subjective   Chief Complaint: Shortness of breath  HPI: Chronic dyspnea BP low this am Discussed possible need for thoracentesis   ROS:  All other ROS reviewed and negative. Pertinent positives noted in the HPI.     Inpatient Medications  Scheduled Meds: . anastrozole  1 mg Oral Daily  . arformoterol  15 mcg Nebulization BID  . atorvastatin  10 mg Oral Daily  . budesonide  0.5 mg Nebulization Daily  . digoxin  125 mcg Oral Q24H  . fluorometholone  1 drop Both Eyes Daily  . furosemide  80 mg Intravenous BID  . heparin  5,000 Units Subcutaneous Q8H  . ipratropium  0.5 mg Nebulization BID  . latanoprost  1 drop Both Eyes QHS  . lidocaine  2 patch Transdermal Q24H  . pantoprazole  40 mg Oral Daily  . polyethylene glycol  17 g Oral Daily   Continuous Infusions:  PRN Meds: acetaminophen, albuterol, nitroGLYCERIN, ondansetron (ZOFRAN) IV   Vital Signs   Vitals:   12/09/20 2122 12/09/20 2300 12/10/20 0300 12/10/20 0717  BP:  94/67 105/64 90/64  Pulse:  71 69 85  Resp:  14 (!) 23 16  Temp:  98 F (36.7 C) 98.7 F (37.1 C) 98.1 F (36.7 C)  TempSrc:  Oral Oral Oral  SpO2: 94% 94% 100% 92%  Weight:   61.3 kg   Height:        Intake/Output Summary (Last 24 hours) at 12/10/2020 0719 Last data filed at 12/10/2020 0300 Gross per 24 hour  Intake 920 ml  Output 2050 ml  Net -1130 ml   Last 3 Weights 12/10/2020 12/09/2020 12/08/2020  Weight (lbs) 135 lb 2.3 oz 134 lb 4.2 oz 136 lb 9.6 oz  Weight (kg) 61.3 kg 60.9 kg 61.961 kg      Telemetry  12/10/2020 afib rates 0-100 bpm   ECG  The most recent ECG shows atrial fibrillation heart rate 76, LVH by voltage with anterolateral ST depressions which is likely related to repolarization abnormality, which I personally reviewed.   Physical Exam   Vitals:   12/09/20 2122  12/09/20 2300 12/10/20 0300 12/10/20 0717  BP:  94/67 105/64 90/64  Pulse:  71 69 85  Resp:  14 (!) 23 16  Temp:  98 F (36.7 C) 98.7 F (37.1 C) 98.1 F (36.7 C)  TempSrc:  Oral Oral Oral  SpO2: 94% 94% 100% 92%  Weight:   61.3 kg   Height:         Intake/Output Summary (Last 24 hours) at 12/10/2020 0719 Last data filed at 12/10/2020 0300 Gross per 24 hour  Intake 920 ml  Output 2050 ml  Net -1130 ml    Last 3 Weights 12/10/2020 12/09/2020 12/08/2020  Weight (lbs) 135 lb 2.3 oz 134 lb 4.2 oz 136 lb 9.6 oz  Weight (kg) 61.3 kg 60.9 kg 61.961 kg    Body mass index is 22.49 kg/m.  Chronically ill Pursed lip breathing Oxygen SEM Abdomen soft no burits No edema Palpable DP biltaterally  Decreased BS right base/mid exp wheezing   Labs  High Sensitivity Troponin:  No results for input(s): TROPONINIHS in the last 720 hours.   Cardiac EnzymesNo results for input(s): TROPONINI in the last 168 hours. No results for input(s): TROPIPOC in the last 168 hours.  Chemistry Recent Labs  Lab 12/06/20 1413  12/08/20 2114 12/09/20 0028  NA 142 137 138  K 4.0 4.2 4.0  CL 102 101 101  CO2 _0 GLUCOSE 121* 109* 123*  BUN 21 24* 24*  CREATININE 0.91 1.03* 0.94  CALCIUM 8.6* 8.8* 8.9  PROT 6.6 6.3*  --   ALBUMIN 3.3* 3.2*  --   AST 25 29  --   ALT 18 17  --   ALKPHOS 180* 149*  --   BILITOT 1.1 1.4*  --   GFRNONAA >60 54* >60  ANIONGAP _1 Hematology Recent Labs  Lab 12/06/20 1413 12/08/20 2114 12/09/20 0028  WBC 8.8 9.2 9.2  RBC 4.82 4.82 4.70  HGB 14.2 14.4 14.2  HCT 45.2 45.1 43.8  MCV 93.8 93.6 93.2  MCH 29.5 29.9 30.2  MCHC 31.4 31.9 32.4  RDW 16.3* 16.4* 16.4*  PLT 288 303 336   BNP Recent Labs  Lab 12/08/20 2114  BNP 1,996.9*    DDimer No results for input(s): DDIMER in the last 168 hours.   Radiology  ECHOCARDIOGRAM COMPLETE  Result Date: 12/09/2020    ECHOCARDIOGRAM REPORT   Patient Name:   [name] Date of Exam: 12/09/2020 Medical Rec  #:  [id number]         Height:       65.0 in Accession #:    [id number]        Weight:       134.3 lb Date of Birth:  [birth year]        BSA:          1.670 m Patient Age:    27 years          BP:           99/67 mmHg Patient Gender: F                 HR:           89 bpm. Exam Location:  Inpatient Procedure: 2D Echo, Cardiac Doppler and Color Doppler Indications:    I48.91* Unspeicified atrial fibrillation  History:        Patient has prior history of Echocardiogram examinations, most                 recent 12/15/2019. CHF, COPD; Signs/Symptoms:Shortness of Breath.  Sonographer:    Merrie Roof Referring Phys: [id number] [place]  1. Left ventricular ejection fraction, by estimation, is 60 to 65%. The left ventricle has normal function. The left ventricle has no regional wall motion abnormalities. There is severe asymmetric left ventricular hypertrophy of the apical segment. 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 Cardiac amyloid also on differential. Consider cardiac MRI for further evaluation.  2. Right ventricular systolic function is severely reduced. The right ventricular size is moderately enlarged. There is severely elevated pulmonary artery systolic pressure. The estimated right ventricular systolic pressure is 46.9 mmHg.  3. The mitral valve is normal in structure. Trivial mitral valve regurgitation.  4. The aortic valve is tricuspid. Aortic valve regurgitation is not visualized. Mild aortic stenosis. 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 Tricuspid valve regurgitation is moderate to severe.  6. Right atrial size was severely dilated.  7. Left atrial size was moderately dilated.  8. The inferior vena cava is dilated in size with <50% respiratory variability, suggesting right atrial pressure of  15 mmHg. FINDINGS  Left Ventricle: Left  ventricular ejection fraction, by estimation, is 60 to 65%. The left ventricle has normal function. The left ventricle has no regional wall motion abnormalities. The left ventricular internal cavity size was small. There is severe asymmetric left ventricular hypertrophy of the apical segment. Left ventricular diastolic parameters are indeterminate. Right Ventricle: The right ventricular size is moderately enlarged. Right vetricular wall thickness was not well visualized. Right ventricular systolic function is severely reduced. There is severely elevated pulmonary artery systolic pressure. The tricuspid regurgitant velocity is 4.44 m/s, and with an assumed right atrial pressure of 15 mmHg, the estimated right ventricular systolic pressure is 02.7 mmHg. Left Atrium: Left atrial size was moderately dilated. Right Atrium: Right atrial size was severely dilated. Pericardium: There is no evidence of pericardial effusion. Mitral Valve: The mitral valve is normal in structure. Trivial mitral valve regurgitation. Tricuspid Valve: The tricuspid valve is normal in structure. Tricuspid valve regurgitation is moderate to severe. Aortic Valve: The aortic valve is tricuspid. There is moderate calcification of the aortic valve. Aortic valve regurgitation is not visualized. Mild aortic stenosis is present. Aortic valve mean gradient measures 7.0 mmHg. Aortic valve peak gradient measures 11.6 mmHg. Aortic valve area, by VTI measures 1.54 cm. Pulmonic Valve: The pulmonic valve was not well visualized. Pulmonic valve regurgitation is trivial. Aorta: The aortic root is normal in size and structure. Venous: The inferior vena cava is dilated in size with less than 50% respiratory variability, suggesting right atrial pressure of 15 mmHg. IAS/Shunts: No atrial level shunt detected by color flow Doppler.  LEFT VENTRICLE PLAX 2D LVIDd:         3.60 cm LVIDs:         2.50 cm LV PW:         1.00 cm LV IVS:        0.90 cm LVOT diam:     1.80 cm  LV SV:         37 LV SV Index:   22 LVOT Area:     2.54 cm  RIGHT VENTRICLE          IVC RV Basal diam:  5.00 cm  IVC diam: 2.60 cm RV Mid diam:    4.10 cm LEFT ATRIUM           Index       RIGHT ATRIUM           Index LA diam:      4.60 cm 2.75 cm/m  RA Area:     32.00 cm LA Vol (A2C): 71.3 ml 42.70 ml/m RA Volume:   109.00 ml 65.27 ml/m LA Vol (A4C): 91.3 ml 54.67 ml/m  AORTIC VALVE AV Area (Vmax):    1.69 cm AV Area (Vmean):   1.39 cm AV Area (VTI):     1.54 cm AV Vmax:           170.00 cm/s AV Vmean:          121.000 cm/s AV VTI:            0.239 m AV Peak Grad:      11.6 mmHg AV Mean Grad:      7.0 mmHg LVOT Vmax:         113.00 cm/s LVOT Vmean:        66.100 cm/s LVOT VTI:          0.145 m LVOT/AV VTI ratio: 0.61  AORTA Ao Root diam: 3.20 cm TRICUSPID VALVE  TR Peak grad:   78.9 mmHg TR Vmax:        444.00 cm/s  SHUNTS Systemic VTI:  0.14 m Systemic Diam: 1.80 cm Oswaldo Milian MD Electronically signed by Oswaldo Milian MD Signature Date/Time: 12/09/2020/2:11:14 PM    Final     Cardiac Studies  Echo pending  Patient Profile  [name] is a 86 y.o. female with atrial fibrillation (not on anticoagulation due to prior intracranial hemorrhage), HFpEF, COPD, breast cancer with recurrence (metastatic disease to lungs and mediastinum), hyperlipidemia who was admitted on 12/08/2020 with acute on chronic diastolic heart failure.  Assessment & Plan   1.  Acute on chronic diastolic heart failure -5001 cc out continue bid lasix   2.  Right pleural effusion -in setting of metastatic breast CA check PA/Lateral CXR with left lateral decubitus and likely Need thoracentesis today   3.  Permanent atrial fibrillation, not on anticoagulation due to prior intracranial hemorrhage -no anticoagulation rates ok follow Dig level   4.  COPD -on 2.5 L's at home sats ok   5.  Metastatic breast cancer/right pleural effusion - HER-2 only plan is antiestrogen Rx with anastrozole see above  regarding thoracentesis    6.  History of intracranial hemorrhage -Not a candidate for anticoagulation  Jenkins Rouge MD [facility]

## 2020-12-11 DIAGNOSIS — J90 Pleural effusion, not elsewhere classified: Secondary | ICD-10-CM | POA: Diagnosis not present

## 2020-12-11 DIAGNOSIS — I5033 Acute on chronic diastolic (congestive) heart failure: Secondary | ICD-10-CM | POA: Diagnosis not present

## 2020-12-11 DIAGNOSIS — I4891 Unspecified atrial fibrillation: Secondary | ICD-10-CM

## 2020-12-11 NOTE — Progress Notes (Signed)
PCCM Note  Called by Cardiology regarding benefit thoracentesis for acute on chronic right pleural effusion. On chart review, [name] has had chronic right pleural effusion dating back from 07/2020. Effusion is larger and primary team currently diuresing and reports exam consistent volume overload. Effusion as never been evaluated before and likely thought to be related to heart failure.  After review, I have recommended continuing further diuresis and if patient continues to have acute hypoxemic respiratory failure and lack of clinical improvement despite adequate medical management, please formally consult pulmonary for a diagnostic and therapeutic thoracentesis with the caveat that effusion is likely to recur given it's chronicity.  Rodman Pickle, M.D. [facility] Pulmonary/Critical Care Medicine 12/11/2020 9:55 AM

## 2020-12-11 NOTE — Progress Notes (Signed)
Progress Note  Patient Name: [name] Date of Encounter: 12/11/2020  [facility] Cardiologist: Kirk Ruths, MD   Subjective   86 yo with dyspnea.  Echo shows normal LV systolic function, EF 24-58% Severe LVH,  Has severe apical hypertrophy,  With apical obliteration   Severe pulmonary HTN - est PA pressure of 80mHg RV is dilated and hypocontractile I/O is -2.2. liters.  She has a moderate - large right pleural effusion    Inpatient Medications    Scheduled Meds: . anastrozole  1 mg Oral Daily  . arformoterol  15 mcg Nebulization BID  . atorvastatin  10 mg Oral Daily  . budesonide  0.5 mg Nebulization Daily  . digoxin  125 mcg Oral Q24H  . fluorometholone  1 drop Both Eyes Daily  . furosemide  80 mg Intravenous BID  . heparin  5,000 Units Subcutaneous Q8H  . ipratropium  0.5 mg Nebulization BID  . latanoprost  1 drop Both Eyes QHS  . lidocaine  2 patch Transdermal Q24H  . pantoprazole  40 mg Oral Daily  . polyethylene glycol  17 g Oral Daily   Continuous Infusions:  PRN Meds: acetaminophen, albuterol, nitroGLYCERIN, ondansetron (ZOFRAN) IV   Vital Signs    Vitals:   12/10/20 2025 12/11/20 0000 12/11/20 0300 12/11/20 0740  BP: (!) 89/59 117/78 107/64 104/63  Pulse:  (!) 103 94 86  Resp:  20 (!) 25 20  Temp:  97.6 F (36.4 C) 97.6 F (36.4 C) 98.1 F (36.7 C)  TempSrc:  Oral Oral Oral  SpO2: 93% 94% 91% 94%  Weight:   61.6 kg   Height:        Intake/Output Summary (Last 24 hours) at 12/11/2020 00998Last data filed at 12/11/2020 03382Gross per 24 hour  Intake 1160 ml  Output 1400 ml  Net -240 ml   Last 3 Weights 12/11/2020 12/10/2020 12/09/2020  Weight (lbs) 135 lb 12.9 oz 135 lb 2.3 oz 134 lb 4.2 oz  Weight (kg) 61.6 kg 61.3 kg 60.9 kg      Telemetry    afib , controlled V response - Personally Reviewed  ECG     - Personally Reviewed  Physical Exam   GEN: elderly female,  Sitting , propped up in the bedside table, pursed lip  breathing   Neck: No JVD Cardiac: irreg. Irreg,   25-0/5systlic murmur  Respiratory: greatly decreased breath sounds on right side ( lower 2/3 lung field )  GI: Soft, nontender, non-distended  MS:  2+ edema ,  Muscle wasting  Neuro:  Nonfocal  Psych: Normal affect   Labs    High Sensitivity Troponin:  No results for input(s): TROPONINIHS in the last 720 hours.    Chemistry Recent Labs  Lab 12/06/20 1413 12/08/20 2114 12/09/20 0028  NA 142 137 138  K 4.0 4.2 4.0  CL 102 101 101  CO2 _0 GLUCOSE 121* 109* 123*  BUN 21 24* 24*  CREATININE 0.91 1.03* 0.94  CALCIUM 8.6* 8.8* 8.9  PROT 6.6 6.3*  --   ALBUMIN 3.3* 3.2*  --   AST 25 29  --   ALT 18 17  --   ALKPHOS 180* 149*  --   BILITOT 1.1 1.4*  --   GFRNONAA >60 54* >60  ANIONGAP _1 Hematology Recent Labs  Lab 12/06/20 1413 12/08/20 2114 12/09/20 0028  WBC 8.8 9.2 9.2  RBC 4.82 4.82 4.70  HGB 14.2 14.4 14.2  HCT 45.2 45.1 43.8  MCV 93.8 93.6 93.2  MCH 29.5 29.9 30.2  MCHC 31.4 31.9 32.4  RDW 16.3* 16.4* 16.4*  PLT 288 303 336    BNP Recent Labs  Lab 12/08/20 2114  BNP 1,996.9*     DDimer No results for input(s): DDIMER in the last 168 hours.   Radiology    DG Chest 2 View  Result Date: 12/10/2020 CLINICAL DATA:  86 year old with current history of CHF, presenting with worsening shortness of breath with exertion, orthopnea, worsening BILATERAL lower extremity edema and pleuritic LEFT chest pain. Personal history of metastatic breast cancer post BILATERAL mastectomies in 2020. EXAM: CHEST - 2 VIEW COMPARISON:  Chest x-ray 11/10/2020 and earlier. CT chest 12/06/2020 FINDINGS: Cardiac silhouette markedly enlarged, unchanged. Mild pulmonary venous hypertension without overt edema currently, with improvement since the 11/10/2020 examination. Chronic moderately large RIGHT pleural effusion and associated passive atelectasis in the RIGHT LOWER LOBE, unchanged over multiple prior examinations dating  back to 2020. Small LEFT pleural effusion, improved since the prior examinations. No new pulmonary parenchymal abnormalities. Osseous demineralization. Degenerative changes involving the thoracic spine. Calcific tendinitis involving the LEFT shoulder. IMPRESSION: 1. Stable marked cardiomegaly. Pulmonary venous hypertension without overt edema currently. 2. Chronic moderately large RIGHT pleural effusion and associated passive atelectasis in the RIGHT LOWER LOBE. 3. No new or acute cardiopulmonary disease. Electronically Signed   By: Evangeline Dakin M.D.   On: 12/10/2020 13:12   DG Chest Left Decubitus  Result Date: 12/10/2020 CLINICAL DATA:  Pleural effusion EXAM: CHEST - LEFT DECUBITUS COMPARISON:  PA and lateral chest radiographs December 10, 2020; chest CT December 06, 2020 FINDINGS: There is free-flowing pleural fluid on the left. There is apparent consolidation in the left base. Cardiac prominence is stable. IMPRESSION: There is free-flowing pleural effusion on the left. Electronically Signed   By: Lowella Grip III M.D.   On: 12/10/2020 13:06   ECHOCARDIOGRAM COMPLETE  Result Date: 12/09/2020    ECHOCARDIOGRAM REPORT   Patient Name:   [name] Date of Exam: 12/09/2020 Medical Rec #:  [id number]         Height:       65.0 in Accession #:    [id number]        Weight:       134.3 lb Date of Birth:  03/11/1935        BSA:          1.670 m Patient Age:    21 years          BP:           99/67 mmHg Patient Gender: F                 HR:           89 bpm. Exam Location:  Inpatient Procedure: 2D Echo, Cardiac Doppler and Color Doppler Indications:    I48.91* Unspeicified atrial fibrillation  History:        Patient has prior history of Echocardiogram examinations, most                 recent 12/15/2019. CHF, COPD; Signs/Symptoms:Shortness of Breath.  Sonographer:    Merrie Roof Referring Phys: [id number] [place]  1. Left ventricular ejection fraction, by estimation, is 60 to 65%. The left  ventricle has normal function. The left ventricle has no regional wall motion abnormalities. There is severe asymmetric left ventricular hypertrophy of the apical segment. 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 Cardiac amyloid also on differential. Consider cardiac MRI for further evaluation.  2. Right ventricular systolic function is severely reduced. The right ventricular size is moderately enlarged. There is severely elevated pulmonary artery systolic pressure. The estimated right ventricular systolic pressure is 53.9 mmHg.  3. The mitral valve is normal in structure. Trivial mitral valve regurgitation.  4. The aortic valve is tricuspid. Aortic valve regurgitation is not visualized. Mild aortic stenosis. 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 Tricuspid valve regurgitation is moderate to severe.  6. Right atrial size was severely dilated.  7. Left atrial size was moderately dilated.  8. The inferior vena cava is dilated in size with <50% respiratory variability, suggesting right atrial pressure of 15 mmHg. FINDINGS  Left Ventricle: Left ventricular ejection fraction, by estimation, is 60 to 65%. The left ventricle has normal function. The left ventricle has no regional wall motion abnormalities. The left ventricular internal cavity size was small. There is severe asymmetric left ventricular hypertrophy of the apical segment. Left ventricular diastolic parameters are indeterminate. Right Ventricle: The right ventricular size is moderately enlarged. Right vetricular wall thickness was not well visualized. Right ventricular systolic function is severely reduced. There is severely elevated pulmonary artery systolic pressure. The tricuspid regurgitant velocity is 4.44 m/s, and with an assumed right atrial pressure of 15 mmHg, the estimated right ventricular systolic  pressure is 76.7 mmHg. Left Atrium: Left atrial size was moderately dilated. Right Atrium: Right atrial size was severely dilated. Pericardium: There is no evidence of pericardial effusion. Mitral Valve: The mitral valve is normal in structure. Trivial mitral valve regurgitation. Tricuspid Valve: The tricuspid valve is normal in structure. Tricuspid valve regurgitation is moderate to severe. Aortic Valve: The aortic valve is tricuspid. There is moderate calcification of the aortic valve. Aortic valve regurgitation is not visualized. Mild aortic stenosis is present. Aortic valve mean gradient measures 7.0 mmHg. Aortic valve peak gradient measures 11.6 mmHg. Aortic valve area, by VTI measures 1.54 cm. Pulmonic Valve: The pulmonic valve was not well visualized. Pulmonic valve regurgitation is trivial. Aorta: The aortic root is normal in size and structure. Venous: The inferior vena cava is dilated in size with less than 50% respiratory variability, suggesting right atrial pressure of 15 mmHg. IAS/Shunts: No atrial level shunt detected by color flow Doppler.  LEFT VENTRICLE PLAX 2D LVIDd:         3.60 cm LVIDs:         2.50 cm LV PW:         1.00 cm LV IVS:        0.90 cm LVOT diam:     1.80 cm LV SV:         37 LV SV Index:   22 LVOT Area:     2.54 cm  RIGHT VENTRICLE          IVC RV Basal diam:  5.00 cm  IVC diam: 2.60 cm RV Mid diam:    4.10 cm LEFT ATRIUM           Index       RIGHT ATRIUM           Index LA diam:      4.60 cm 2.75 cm/m  RA Area:     32.00 cm LA Vol (A2C): 71.3 ml 42.70 ml/m RA Volume:   109.00  ml 65.27 ml/m LA Vol (A4C): 91.3 ml 54.67 ml/m  AORTIC VALVE AV Area (Vmax):    1.69 cm AV Area (Vmean):   1.39 cm AV Area (VTI):     1.54 cm AV Vmax:           170.00 cm/s AV Vmean:          121.000 cm/s AV VTI:            0.239 m AV Peak Grad:      11.6 mmHg AV Mean Grad:      7.0 mmHg LVOT Vmax:         113.00 cm/s LVOT Vmean:        66.100 cm/s LVOT VTI:          0.145 m LVOT/AV VTI ratio: 0.61   AORTA Ao Root diam: 3.20 cm TRICUSPID VALVE TR Peak grad:   78.9 mmHg TR Vmax:        444.00 cm/s  SHUNTS Systemic VTI:  0.14 m Systemic Diam: 1.80 cm Oswaldo Milian MD Electronically signed by Oswaldo Milian MD Signature Date/Time: 12/09/2020/2:11:14 PM    Final     Cardiac Studies     Patient Profile     86 y.o. female with apical LVH,   COPD,  L breast cancer, large R pleural effusion   Assessment & Plan    1. Acute on chonic diastolic dysfunction :   Has continued to diurese.   Is breathing better.   She is eager to go home.   Says she is almost back to baseline.   2.   Pleural effusion:   Has been present for months .  I have reviewed with Dr. Loanne Drilling ( PCCM)  She is willing to do a thoracentesis but says it will likely return.   Dr. Loanne Drilling encouraged [place] to continue diuresis and if the effusion has not changed , then we could consider throacentesis in order to provide her with some relief.  She is very frail, is on 5 L O2.   Has copd.  3. COPD :   Severe  4  Pulmonary HTN:  Severe     For questions or updates, please contact [place] Please consult [email or web address] for contact info under        Signed, Mertie Moores, MD  12/11/2020, 9:09 AM

## 2020-12-12 DIAGNOSIS — I5033 Acute on chronic diastolic (congestive) heart failure: Secondary | ICD-10-CM | POA: Diagnosis not present

## 2020-12-12 DIAGNOSIS — I4891 Unspecified atrial fibrillation: Secondary | ICD-10-CM | POA: Diagnosis not present

## 2020-12-12 LAB — BASIC METABOLIC PANEL
Anion gap: 10 (ref 5–15)
BUN: 16 mg/dL (ref 8–23)
CO2: 34 mmol/L — ABNORMAL HIGH (ref 22–32)
Calcium: 8.4 mg/dL — ABNORMAL LOW (ref 8.9–10.3)
Chloride: 92 mmol/L — ABNORMAL LOW (ref 98–111)
Creatinine, Ser: 0.91 mg/dL (ref 0.44–1.00)
GFR, Estimated: >60 mL/min (ref >60)
Glucose, Bld: 147 mg/dL — ABNORMAL HIGH (ref 70–99)
Potassium: 3.6 mmol/L (ref 3.5–5.1)
Sodium: 136 mmol/L (ref 135–145)

## 2020-12-12 MED ORDER — POTASSIUM CHLORIDE CRYS ER 20 MEQ PO TBCR
40.0000 meq | EXTENDED_RELEASE_TABLET | Freq: Once | ORAL | Status: AC
  Administered 2020-12-12: 40 meq via ORAL
  Filled 2020-12-12: qty 2

## 2020-12-12 NOTE — Plan of Care (Signed)

## 2020-12-12 NOTE — Progress Notes (Signed)
Progress Note  Patient Name: [name] Date of Encounter: 12/12/2020  [facility] Cardiologist: Kirk Ruths, MD   Subjective   She does state that she is feeling better.  Less short of breath.  Her son is at bedside.  Inpatient Medications    Scheduled Meds: . anastrozole  1 mg Oral Daily  . arformoterol  15 mcg Nebulization BID  . atorvastatin  10 mg Oral Daily  . budesonide  0.5 mg Nebulization Daily  . digoxin  125 mcg Oral Q24H  . fluorometholone  1 drop Both Eyes Daily  . furosemide  80 mg Intravenous BID  . heparin  5,000 Units Subcutaneous Q8H  . ipratropium  0.5 mg Nebulization BID  . latanoprost  1 drop Both Eyes QHS  . lidocaine  2 patch Transdermal Q24H  . pantoprazole  40 mg Oral Daily  . polyethylene glycol  17 g Oral Daily   Continuous Infusions:  PRN Meds: acetaminophen, albuterol, nitroGLYCERIN, ondansetron (ZOFRAN) IV   Vital Signs    Vitals:   12/12/20 0300 12/12/20 0741 12/12/20 0800 12/12/20 0907  BP: 93/71 (!) 81/54 98/62   Pulse: 85 86 87 87  Resp: _0 Temp: 98 F (36.7 C) 98.3 F (36.8 C)    TempSrc: Oral Oral    SpO2: 95% 99% 95% 99%  Weight: 60.6 kg     Height:        Intake/Output Summary (Last 24 hours) at 12/12/2020 1117 Last data filed at 12/12/2020 0536 Gross per 24 hour  Intake 720 ml  Output 1600 ml  Net -880 ml   Last 3 Weights 12/12/2020 12/11/2020 12/10/2020  Weight (lbs) 133 lb 9.6 oz 135 lb 12.9 oz 135 lb 2.3 oz  Weight (kg) 60.6 kg 61.6 kg 61.3 kg      Telemetry    Atrial fibrillation with controlled ventricular response- Personally Reviewed  ECG    No new- Personally Reviewed  Physical Exam   GEN:  Elderly, mildly increased work of breathing Neck: No JVD Cardiac: RRR, 2/6 systolic murmur,no rubs, or gallops.  Respiratory:  Decreased right-sided breath sounds. GI: Soft, nontender, non-distended  MS:  1-2+ edema; No deformity.  Muscle wasting noted Neuro:  Nonfocal  Psych: Normal  affect   Labs    High Sensitivity Troponin:  No results for input(s): TROPONINIHS in the last 720 hours.    Chemistry Recent Labs  Lab 12/06/20 1413 12/08/20 2114 12/09/20 0028  NA 142 137 138  K 4.0 4.2 4.0  CL 102 101 101  CO2 _1 GLUCOSE 121* 109* 123*  BUN 21 24* 24*  CREATININE 0.91 1.03* 0.94  CALCIUM 8.6* 8.8* 8.9  PROT 6.6 6.3*  --   ALBUMIN 3.3* 3.2*  --   AST 25 29  --   ALT 18 17  --   ALKPHOS 180* 149*  --   BILITOT 1.1 1.4*  --   GFRNONAA >60 54* >60  ANIONGAP _2 Hematology Recent Labs  Lab 12/06/20 1413 12/08/20 2114 12/09/20 0028  WBC 8.8 9.2 9.2  RBC 4.82 4.82 4.70  HGB 14.2 14.4 14.2  HCT 45.2 45.1 43.8  MCV 93.8 93.6 93.2  MCH 29.5 29.9 30.2  MCHC 31.4 31.9 32.4  RDW 16.3* 16.4* 16.4*  PLT 288 303 336    BNP Recent Labs  Lab 12/08/20 2114  BNP 1,996.9*     DDimer No results for input(s): DDIMER in  the last 168 hours.   Radiology    No results found.  Cardiac Studies   Echocardiogram this admit -EF 65% with severe LVH and apical hypertrophy.  Apical obliteration.  Severe pulmonary hypertension with pulmonary pressures 94 mmHg.  RV is quite dilated and hypocontractile.  Patient Profile     86 y.o. female here with acute on chronic diastolic heart failure volume overloaded diuresis with right pleural effusion COPD permanent atrial fibrillation metastatic breast cancer and history of intracranial hemorrhage  Assessment & Plan    Acute on chronic diastolic heart failure -Diuresed with Lasix IV 80 mg twice daily. -Right pleural effusion evaluated by critical care, Dr. Kathrine Cords.  Chronic right pleural effusion dating back from November 2021.  Recommendation was for continued diuresis.  If lack of clinical improvement, suggestion is to formally consult pulmonary for diagnostic and therapeutic thoracentesis with the caveat that effusion is likely to recur given its chronicity. -Thankfully, she seems to be turning  somewhat of a corner.  Feeling a little bit better.  Less short of breath.  Still putting out good urine.  Lets continue with IV Lasix.  Permanent atrial fibrillation -Not on anticoagulation due to prior intracranial hemorrhage. -Digoxin for rate control.  Doing well on telemetry. -Diltiazem as needed.  Rates are acceptable at this point.  COPD -Home O2 2.5 L.  On home inhalers.  She was asking when her oxygen can be turned down.  Metastatic breast cancer -Metastatic disease to the lungs and mediastinum.  Antiestrogen therapy.  DNR/DNI  For questions or updates, please contact [place] Please consult [email or web address] for contact info under        Signed, Candee Furbish, MD  12/12/2020, 11:17 AM

## 2020-12-13 DIAGNOSIS — I4891 Unspecified atrial fibrillation: Secondary | ICD-10-CM | POA: Diagnosis not present

## 2020-12-13 DIAGNOSIS — I5033 Acute on chronic diastolic (congestive) heart failure: Secondary | ICD-10-CM | POA: Diagnosis not present

## 2020-12-13 LAB — BASIC METABOLIC PANEL
Anion gap: 9 (ref 5–15)
BUN: 15 mg/dL (ref 8–23)
CO2: 35 mmol/L — ABNORMAL HIGH (ref 22–32)
Calcium: 8.7 mg/dL — ABNORMAL LOW (ref 8.9–10.3)
Chloride: 94 mmol/L — ABNORMAL LOW (ref 98–111)
Creatinine, Ser: 0.78 mg/dL (ref 0.44–1.00)
GFR, Estimated: >60 mL/min (ref >60)
Glucose, Bld: 82 mg/dL (ref 70–99)
Potassium: 4.2 mmol/L (ref 3.5–5.1)
Sodium: 138 mmol/L (ref 135–145)

## 2020-12-13 MED ORDER — BUDESONIDE 0.5 MG/2ML IN SUSP
0.5000 mg | Freq: Two times a day (BID) | RESPIRATORY_TRACT | Status: DC
  Administered 2020-12-13 – 2020-12-14 (×3): 0.5 mg via RESPIRATORY_TRACT
  Filled 2020-12-13 (×3): qty 2

## 2020-12-13 NOTE — Care Management Important Message (Signed)
Important Message  Patient Details  Name: [name] MRN: [id number] Date of Birth: 1935/02/24   Medicare Important Message Given:  Yes     Orbie Pyo 12/13/2020, 2:30 PM

## 2020-12-13 NOTE — Plan of Care (Signed)

## 2020-12-13 NOTE — Progress Notes (Signed)
Heart Failure Navigator Progress Note  Assessed for Heart & Vascular TOC clinic readiness.  Unfortunately at this time the patient does not meet criteria due to metastatic breast CA.   Navigator available for reassessment of patient.   Pricilla Holm, RN, BSN Heart Failure Nurse Navigator [phone]

## 2020-12-13 NOTE — Progress Notes (Signed)
Progress Note  Patient Name: [name] Date of Encounter: 12/13/2020  [facility] Cardiologist: Kirk Ruths, MD   Subjective   Feeling slightly better today.  She states that she is much better than she was when she came in.  Still understands the reality of the situation.  Clair Gulling, her husband is also undergoing cancer treatment for leukemia.  Their son lives nearby.  Inpatient Medications    Scheduled Meds: . anastrozole  1 mg Oral Daily  . arformoterol  15 mcg Nebulization BID  . atorvastatin  10 mg Oral Daily  . budesonide  0.5 mg Nebulization Daily  . digoxin  125 mcg Oral Q24H  . fluorometholone  1 drop Both Eyes Daily  . furosemide  80 mg Intravenous BID  . heparin  5,000 Units Subcutaneous Q8H  . ipratropium  0.5 mg Nebulization BID  . latanoprost  1 drop Both Eyes QHS  . lidocaine  2 patch Transdermal Q24H  . pantoprazole  40 mg Oral Daily  . polyethylene glycol  17 g Oral Daily   Continuous Infusions:  PRN Meds: acetaminophen, albuterol, nitroGLYCERIN, ondansetron (ZOFRAN) IV   Vital Signs    Vitals:   12/12/20 1930 12/12/20 2300 12/13/20 0042 12/13/20 0318  BP:  93/62 93/71 98/69  Pulse:  90 86 95  Resp:  _0 Temp:  98.1 F (36.7 C)  98.2 F (36.8 C)  TempSrc:  Oral  Oral  SpO2: 97% 98% 96% 94%  Weight:    59.3 kg  Height:        Intake/Output Summary (Last 24 hours) at 12/13/2020 0729 Last data filed at 12/13/2020 0600 Gross per 24 hour  Intake --  Output 2650 ml  Net -2650 ml   Last 3 Weights 12/13/2020 12/12/2020 12/11/2020  Weight (lbs) 130 lb 11.7 oz 133 lb 9.6 oz 135 lb 12.9 oz  Weight (kg) 59.3 kg 60.6 kg 61.6 kg      Telemetry    Atrial fibrillation, well controlled- Personally Reviewed  ECG    No new- Personally Reviewed  Physical Exam   GEN:  Elderly Neck: No JVD Cardiac: RRR, 2/6 systolic murmur, no rubs, or gallops.  Respiratory: Clear to auscultation bilaterally. GI: Soft, nontender, non-distended  MS:   1+ edema; No deformity. Neuro:  Nonfocal  Psych: Normal affect   Labs    High Sensitivity Troponin:  No results for input(s): TROPONINIHS in the last 720 hours.    Chemistry Recent Labs  Lab 12/06/20 1413 12/08/20 2114 12/09/20 0028 12/12/20 1438  NA 142 137 138 136  K 4.0 4.2 4.0 3.6  CL 102 101 101 92*  CO2 _1 34*  GLUCOSE 121* 109* 123* 147*  BUN 21 24* 24* 16  CREATININE 0.91 1.03* 0.94 0.91  CALCIUM 8.6* 8.8* 8.9 8.4*  PROT 6.6 6.3*  --   --   ALBUMIN 3.3* 3.2*  --   --   AST 25 29  --   --   ALT 18 17  --   --   ALKPHOS 180* 149*  --   --   BILITOT 1.1 1.4*  --   --   GFRNONAA >60 54* >60 >60  ANIONGAP _2 Hematology Recent Labs  Lab 12/06/20 1413 12/08/20 2114 12/09/20 0028  WBC 8.8 9.2 9.2  RBC 4.82 4.82 4.70  HGB 14.2 14.4 14.2  HCT 45.2 45.1 43.8  MCV 93.8 93.6 93.2  MCH 29.5  29.9 30.2  MCHC 31.4 31.9 32.4  RDW 16.3* 16.4* 16.4*  PLT 288 303 336    BNP Recent Labs  Lab 12/08/20 2114  BNP 1,996.9*     DDimer No results for input(s): DDIMER in the last 168 hours.   Radiology    No results found.  Cardiac Studies      Echocardiogram this admit -EF 65% with severe LVH and apical hypertrophy.  Apical obliteration.  Severe pulmonary hypertension with pulmonary pressures 94 mmHg.  RV is quite dilated and hypocontractile.   Patient Profile     86 y.o. female here with acute on chronic diastolic heart failure volume overloaded diuresis with right pleural effusion COPD permanent atrial fibrillation metastatic breast cancer and history of intracranial hemorrhage  Assessment & Plan    Acute on chronic diastolic heart failure -Currently on Lasix 80 mg IV twice daily 2.6 L out yesterday, weight down to 130.  On admit 134 -From a breathing perspective, improved.  Feeling little bit better.  Lab work overall reassuring.  Renal function maintaining.   Permanent atrial fibrillation -On digoxin for rate control.  Diltiazem is  as needed.  Blood pressure fairly soft.  Overall well controlled. -No anticoagulation because of prior intracranial hemorrhage.  Metastatic breast cancer -Disease to the lungs and mediastinum, antiestrogen therapy.  COPD -Home O2 2.5 L.  DNR/DNI  After diuresis today, hopeful discharge tomorrow.  For questions or updates, please contact [place] Please consult [email or web address] for contact info under        Signed, Candee Furbish, MD  12/13/2020, 7:29 AM

## 2020-12-14 ENCOUNTER — Other Ambulatory Visit: Payer: Self-pay | Admitting: Cardiology

## 2020-12-14 DIAGNOSIS — Z79899 Other long term (current) drug therapy: Secondary | ICD-10-CM

## 2020-12-14 DIAGNOSIS — I5033 Acute on chronic diastolic (congestive) heart failure: Secondary | ICD-10-CM | POA: Diagnosis not present

## 2020-12-14 DIAGNOSIS — I272 Pulmonary hypertension, unspecified: Secondary | ICD-10-CM

## 2020-12-14 LAB — BASIC METABOLIC PANEL
Anion gap: 8 (ref 5–15)
BUN: 16 mg/dL (ref 8–23)
CO2: 36 mmol/L — ABNORMAL HIGH (ref 22–32)
Calcium: 8.6 mg/dL — ABNORMAL LOW (ref 8.9–10.3)
Chloride: 94 mmol/L — ABNORMAL LOW (ref 98–111)
Creatinine, Ser: 0.91 mg/dL (ref 0.44–1.00)
GFR, Estimated: >60 mL/min (ref >60)
Glucose, Bld: 90 mg/dL (ref 70–99)
Potassium: 3.9 mmol/L (ref 3.5–5.1)
Sodium: 138 mmol/L (ref 135–145)

## 2020-12-14 MED ORDER — FUROSEMIDE 80 MG PO TABS
80.0000 mg | ORAL_TABLET | Freq: Two times a day (BID) | ORAL | Status: DC
  Administered 2020-12-14: 80 mg via ORAL
  Filled 2020-12-14: qty 1

## 2020-12-14 MED ORDER — FUROSEMIDE 80 MG PO TABS: 80.0000 mg | ORAL_TABLET | Freq: Two times a day (BID) | ORAL | 2 refills | Status: AC

## 2020-12-14 NOTE — Progress Notes (Incomplete)
Pt was hypotensive in the morning 86/58. Hold IV lasix, 97/67 went up at 0900. Dr. Marlou Porch, changed po lasix 80 mg. BP was 114/72 at 1000. Administered lasix po. BP down to 84/58 at 1200. Rechecked BP was 91/68

## 2020-12-14 NOTE — Progress Notes (Signed)
Progress Note  Patient Name: [name] Date of Encounter: 12/14/2020  [facility] Cardiologist: Kirk Ruths, MD   Subjective   Feeling better.  Shortness of breath is much improved from admission, however still present at baseline.  Inpatient Medications    Scheduled Meds: . anastrozole  1 mg Oral Daily  . arformoterol  15 mcg Nebulization BID  . atorvastatin  10 mg Oral Daily  . budesonide  0.5 mg Nebulization BID  . digoxin  125 mcg Oral Q24H  . fluorometholone  1 drop Both Eyes Daily  . furosemide  80 mg Intravenous BID  . heparin  5,000 Units Subcutaneous Q8H  . ipratropium  0.5 mg Nebulization BID  . latanoprost  1 drop Both Eyes QHS  . lidocaine  2 patch Transdermal Q24H  . pantoprazole  40 mg Oral Daily  . polyethylene glycol  17 g Oral Daily   Continuous Infusions:  PRN Meds: acetaminophen, albuterol, nitroGLYCERIN, ondansetron (ZOFRAN) IV   Vital Signs    Vitals:   12/13/20 1955 12/13/20 2315 12/14/20 0000 12/14/20 0345  BP:  (!) 86/56 102/65 (!) 94/57  Pulse:  85 86 82  Resp:  _0 Temp:  97.9 F (36.6 C)  97.8 F (36.6 C)  TempSrc:  Oral  Oral  SpO2: 94% 92% 94% 93%  Weight:    56.8 kg  Height:    _1  (1.651 m)    Intake/Output Summary (Last 24 hours) at 12/14/2020 0828 Last data filed at 12/14/2020 0500 Gross per 24 hour  Intake 297 ml  Output 1850 ml  Net -1553 ml   Last 3 Weights 12/14/2020 12/13/2020 12/12/2020  Weight (lbs) 125 lb 3.5 oz 130 lb 11.7 oz 133 lb 9.6 oz  Weight (kg) 56.8 kg 59.3 kg 60.6 kg      Telemetry    Well rate controlled atrial fibrillation- Personally Reviewed    Physical Exam   GEN: No acute distress.  Elderly Neck: No JVD Cardiac: RRR, 2/6 systolic murmur, no rubs, or gallops.  Respiratory:  Decreased breath sounds especially right base.  Mildly increased work of breathing GI: Soft, nontender, non-distended  MS:  Trace lower extremity edema; No deformity. Neuro:  Nonfocal  Psych:  Normal affect   Labs    High Sensitivity Troponin:  No results for input(s): TROPONINIHS in the last 720 hours.    Chemistry Recent Labs  Lab 12/08/20 2114 12/09/20 0028 12/12/20 1438 12/13/20 0540 12/14/20 0145  NA 137   < > 136 138 138  K 4.2   < > 3.6 4.2 3.9  CL 101   < > 92* 94* 94*  CO2 29   < > 34* 35* 36*  GLUCOSE 109*   < > 147* 82 90  BUN 24*   < > _2 CREATININE 1.03*   < > 0.91 0.78 0.91  CALCIUM 8.8*   < > 8.4* 8.7* 8.6*  PROT 6.3*  --   --   --   --   ALBUMIN 3.2*  --   --   --   --   AST 29  --   --   --   --   ALT 17  --   --   --   --   ALKPHOS 149*  --   --   --   --   BILITOT 1.4*  --   --   --   --   GFRNONAA 54*   < > >  60 >60 >60  ANIONGAP 7   < > _0 < > = values in this interval not displayed.     Hematology Recent Labs  Lab 12/08/20 2114 12/09/20 0028  WBC 9.2 9.2  RBC 4.82 4.70  HGB 14.4 14.2  HCT 45.1 43.8  MCV 93.6 93.2  MCH 29.9 30.2  MCHC 31.9 32.4  RDW 16.4* 16.4*  PLT 303 336    BNP Recent Labs  Lab 12/08/20 2114  BNP 1,996.9*     DDimer No results for input(s): DDIMER in the last 168 hours.   Radiology    No results found.  Cardiac Studies    Echocardiogram this admit -EF 65% with severe LVH and apical hypertrophy. Apical obliteration. Severe pulmonary hypertension with pulmonary pressures 94 mmHg. RV is quite dilated and hypocontractile.  Patient Profile     86 y.o. female here with acute on chronic diastolic heart failure, right pleural effusion with metastatic breast cancer intracranial hemorrhage history permanent atrial fibrillation  Assessment & Plan    Acute on chronic diastolic heart failure-EF 65 with severe LVH -Has been receiving Lasix 80 mg IV twice a day yesterday had 1.3 L net out.  Blood pressures currently soft this morning 94/57.  Creatinine remains stable at 0.91.  Potassium 3.9. -Weight is now down to 125 pounds or 56.8 kg.  On admit 135 pounds. -She was taking 60 a.m./40 p.m.  of Lasix at home.  We will go ahead and change to 80 twice daily to hopefully help maintain fluid status.  Check basic metabolic profile in 1 to 2 weeks.  Metastatic breast cancer -Disease to lungs mediastinum antiestrogen therapy.  Potentially contributing to pleural effusion.  Appreciate critical care medicine reviewing.  Agree that pleural effusion likely would promptly reaccumulate without the addition of adequate diuresis like we had performed during this hospitalization.  We will try to maintain this body weight.  COPD with cor pulmonale, secondary pulmonary hypertension, pleural effusion -On home O2 2.5 L. -Estimated pressures of 94 mmHg quite elevated on echocardiogram.  This is secondary to underlying lung disease.  These pressures likely have improved slightly with improvement of left atrial pressures after diuresis for diastolic dysfunction.  Permanent atrial fibrillation -On digoxin for rate control.  Blood pressure fairly soft.  No anticoagulation because of prior intracranial hemorrhage.  Previously declined watchman overall well rate controlled.  DNR/DNI.  We are ready for discharge.  Continue with 4 L of oxygen at home.  Discuss further with Dr. Elsworth Soho of pulmonary as outpatient.  Given her multitude of comorbidities, I think an outpatient palliative care consultation is in order if not already performed.  Her husband Clair Gulling also has leukemia.  This would be excellent for her to help her with care resources  Close follow-up in clinic in 1 to 2 weeks.  For questions or updates, please contact [place] Please consult [email or web address] for contact info under        Signed, Candee Furbish, MD  12/14/2020, 8:28 AM

## 2020-12-14 NOTE — Discharge Summary (Addendum)
Discharge Summary    Patient ID: [name] MRN: [id number]; DOB: [birth year]  Admit date: 12/08/2020 Discharge date: 12/14/2020  PCP:  Janith Lima, MD   [place]  Cardiologist:  Kirk Ruths, MD   Discharge Diagnoses    Principal Problem:   Acute on chronic diastolic CHF (congestive heart failure), NYHA class 3 ([place]) Active Problems:   COPD (chronic obstructive pulmonary disease) ([place])   Atrial fibrillation with RVR (HCC)   HLD (hyperlipidemia)   Benign essential HTN   Cerebellar hemorrhage, nontraumatic (HCC)   Malignant neoplasm of upper-outer quadrant of left breast in female, estrogen receptor positive ([place])   Chronic bilateral pleural effusions   Pulmonary hypertension, unspecified ([place])  Diagnostic Studies/Procedures    Echo 12/09/20:  1. Left ventricular ejection fraction, by estimation, is 60 to 65%. The  left ventricle has normal function. The left ventricle has no regional  wall motion abnormalities. There is severe asymmetric left ventricular  hypertrophy of the apical segment.  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 Cardiac amyloid also on  differential. Consider cardiac MRI for further evaluation.  2. Right ventricular systolic function is severely reduced. The right  ventricular size is moderately enlarged. There is severely elevated  pulmonary artery systolic pressure. The estimated right ventricular  systolic pressure is 95.2 mmHg.  3. The mitral valve is normal in structure. Trivial mitral valve  regurgitation.  4. The aortic valve is tricuspid. Aortic valve regurgitation is not  visualized. Mild aortic stenosis. 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 Tricuspid valve regurgitation is moderate to severe.  6. Right atrial size was severely dilated.  7. Left  atrial size was moderately dilated.  8. The inferior vena cava is dilated in size with <50% respiratory  variability, suggesting right atrial pressure of 15 mmHg.   History of Present Illness     [name] is a 86 y.o. female with atrial fibrillation (not on anticoagulation due to prior intracranial hemorrhage), HFpEF, COPD, breast cancer with recurrence (metastatic disease to lungs and mediastinum), and hyperlipidemia who was admitted from the office on 12/08/2020 with acute on chronic diastolic heart failure.  [name] was seen by Dr. Stanford Breed on 12/08/20 and was noted to be markedly volume overloaded with worsening dyspnea on exertion, orthopnea and worsening bilateral lower extremity edema. She also had pleuritic pain on the left side of her chest with known right pleural effusion from 11/2020 per CT.  Plans for hospital admission for aggressive IV diuresis.   Hospital Course    She was initially started on IV Lasix 63m BID. PA/LA CXR was obtained and PCCM was consulted for possible thoracentesis for expanding effusion (although noted to be chronic). Recommendations were to continue with IV diuresis and if the patient decompensated from a respiratory standpoint, at that time a thoracentesis may be indicated. Echocardiogram performed which showed LVEF at 65% with severe LVH and apical hypertrophy, apical hypertrophy with obliteration, severe pulmonary hypertension with pulmonary pressures 94 mmHg and a hypocontractile RV. After adequate diuresis, she began to improve clinically. Her admit weight was 135lb and is now at 125lb on day of discharge. Her creatinine held well with the Lasix and PO dosing plan will be for 849mPO BID after discharge. We will recheck her labs in 1-2 weeks to ensure renal stability. She is on supplemental home  O2 2.5L therefore will continue at d/c however will increase to 4L with plans for close follow up with Dr. Elsworth Soho in the OP setting. She will remain on digoxin  for her permanent AF.   Hospital problems include: Acute on chronic diastolic heart failure-EF 65 with severe LVH -Has been receiving Lasix 80 mg IV twice a day yesterday had 1.3 L net out.  Blood pressures currently soft this morning 94/57.  Creatinine remains stable at 0.91.  Potassium 3.9. -Weight is now down to 125 pounds or 56.8 kg.  On admit 135 pounds. -She was taking 60 a.m./40 p.m. of Lasix at home.  We will go ahead and change to 80 twice daily to hopefully help maintain fluid status.  Check basic metabolic profile in 1 to 2 weeks.  Metastatic breast cancer -Disease to lungs mediastinum antiestrogen therapy.  Potentially contributing to pleural effusion.  Appreciate critical care medicine reviewing.  Agree that pleural effusion likely would promptly reaccumulate without the addition of adequate diuresis like we had performed during this hospitalization.  We will try to maintain this body weight.  COPD with cor pulmonale, secondary pulmonary hypertension, pleural effusion -On home O2 2.5 L>>will increase to 4L -Estimated pressures of 94 mmHg quite elevated on echocardiogram.  This is secondary to underlying lung disease.  These pressures likely have improved slightly with improvement of left atrial pressures after diuresis for diastolic dysfunction.  Permanent atrial fibrillation -On digoxin for rate control.  Blood pressure fairly soft.  No anticoagulation because of prior intracranial hemorrhage.  Previously declined watchman overall well rate controlled.  DNR/DNI.  Ready for discharge.  Continue with 4 L of oxygen at home.  Discuss further with Dr. Elsworth Soho of pulmonary as outpatient.  Given her multitude of comorbidities,need to consider outpatient palliative care consultation is in order if not already performed.  Her husband [name] also has leukemia.  This would be excellent for her to help her with care resources  Close follow-up in clinic in 1 to 2 weeks.  Consultants: None    The patient was seen and examined by Dr. Marlou Porch who feels that she is stable and ready for discharge today, 12/14/20.   Did the patient have an acute coronary syndrome (MI, NSTEMI, STEMI, etc) this admission?:  No                               Did the patient have a percutaneous coronary intervention (stent / angioplasty)?:  No.    __________  Discharge Vitals Blood pressure 114/72, pulse 84, temperature 98.2 F (36.8 C), temperature source Oral, resp. rate 18, height _0  (1.651 m), weight 56.8 kg, last menstrual period 12/03/2002, SpO2 92 %.  Filed Weights   12/12/20 0300 12/13/20 0318 12/14/20 0345  Weight: 60.6 kg 59.3 kg 56.8 kg    Labs & Radiologic Studies    CBC No results for input(s): WBC, NEUTROABS, HGB, HCT, MCV, PLT in the last 72 hours. Basic Metabolic Panel Recent Labs    12/13/20 0540 12/14/20 0145  NA 138 138  K 4.2 3.9  CL 94* 94*  CO2 35* 36*  GLUCOSE 82 90  BUN 15 16  CREATININE 0.78 0.91  CALCIUM 8.7* 8.6*   Liver Function Tests No results for input(s): AST, ALT, ALKPHOS, BILITOT, PROT, ALBUMIN in the last 72 hours. No results for input(s): LIPASE, AMYLASE in the last 72 hours. High Sensitivity Troponin:   No results for input(s):  TROPONINIHS in the last 720 hours.  BNP Invalid input(s): POCBNP D-Dimer No results for input(s): DDIMER in the last 72 hours. Hemoglobin A1C No results for input(s): HGBA1C in the last 72 hours. Fasting Lipid Panel No results for input(s): CHOL, HDL, LDLCALC, TRIG, CHOLHDL, LDLDIRECT in the last 72 hours. Thyroid Function Tests No results for input(s): TSH, T4TOTAL, T3FREE, THYROIDAB in the last 72 hours.  Invalid input(s): FREET3 _____________  DG Chest 2 View  Result Date: 12/10/2020 CLINICAL DATA:  86 year old with current history of CHF, presenting with worsening shortness of breath with exertion, orthopnea, worsening BILATERAL lower extremity edema and pleuritic LEFT chest pain. Personal history of metastatic  breast cancer post BILATERAL mastectomies in 2020. EXAM: CHEST - 2 VIEW COMPARISON:  Chest x-ray 11/10/2020 and earlier. CT chest 12/06/2020 FINDINGS: Cardiac silhouette markedly enlarged, unchanged. Mild pulmonary venous hypertension without overt edema currently, with improvement since the 11/10/2020 examination. Chronic moderately large RIGHT pleural effusion and associated passive atelectasis in the RIGHT LOWER LOBE, unchanged over multiple prior examinations dating back to 2020. Small LEFT pleural effusion, improved since the prior examinations. No new pulmonary parenchymal abnormalities. Osseous demineralization. Degenerative changes involving the thoracic spine. Calcific tendinitis involving the LEFT shoulder. IMPRESSION: 1. Stable marked cardiomegaly. Pulmonary venous hypertension without overt edema currently. 2. Chronic moderately large RIGHT pleural effusion and associated passive atelectasis in the RIGHT LOWER LOBE. 3. No new or acute cardiopulmonary disease. Electronically Signed   By: Evangeline Dakin M.D.   On: 12/10/2020 13:12   CT Chest Wo Contrast  Result Date: 12/07/2020 CLINICAL DATA:  Non contrast per MD Pt.states hx of breast ca, pt.c/o shortness of breath, lt.mastectomy Dyspnea, chronic, unclear etiology EXAM: CT CHEST WITHOUT CONTRAST TECHNIQUE: Multidetector CT imaging of the chest was performed following the standard protocol without IV contrast. COMPARISON:  PET-CT 07/08/2020 FINDINGS: Cardiovascular: Heart is enlarged. Atria are particularly enlarged Coronary artery calcification and aortic atherosclerotic calcification. Valvular calcification noted. Mediastinum/Nodes: No axillary or supraclavicular adenopathy. No mediastinal or hilar adenopathy. No pericardial fluid. Esophagus normal. Lungs/Pleura: Large layering RIGHT pleural effusion. Passive atelectasis lobe. Small LEFT effusion. Effusion volume is similar to slightly increased from comparison PET-CT scan. Extensive centrilobular  emphysema the upper lobes. No suspicious nodularity. Upper Abdomen: Limited view of the liver, kidneys, pancreas are unremarkable. Normal adrenal glands. Musculoskeletal: No aggressive osseous lesion. IMPRESSION: 1. Persistent large layering RIGHT pleural effusion. 2. No suspicious nodularity. 3. Centrilobular emphysema in the upper lobes. 4. Extensive coronary artery calcification valvular calcification. Cardiomegaly with enlarged atria. Electronically Signed   By: Suzy Bouchard M.D.   On: 12/07/2020 12:47   DG Chest Left Decubitus  Result Date: 12/10/2020 CLINICAL DATA:  Pleural effusion EXAM: CHEST - LEFT DECUBITUS COMPARISON:  PA and lateral chest radiographs December 10, 2020; chest CT December 06, 2020 FINDINGS: There is free-flowing pleural fluid on the left. There is apparent consolidation in the left base. Cardiac prominence is stable. IMPRESSION: There is free-flowing pleural effusion on the left. Electronically Signed   By: Lowella Grip III M.D.   On: 12/10/2020 13:06   ECHOCARDIOGRAM COMPLETE  Result Date: 12/09/2020    ECHOCARDIOGRAM REPORT   Patient Name:   [name] Date of Exam: 12/09/2020 Medical Rec #:  [id number]         Height:       65.0 in Accession #:    [id number]        Weight:       134.3 lb Date of Birth:  May 18, 1935  BSA:          1.670 m Patient Age:    50 years          BP:           99/67 mmHg Patient Gender: F                 HR:           89 bpm. Exam Location:  Inpatient Procedure: 2D Echo, Cardiac Doppler and Color Doppler Indications:    I48.91* Unspeicified atrial fibrillation  History:        Patient has prior history of Echocardiogram examinations, most                 recent 12/15/2019. CHF, COPD; Signs/Symptoms:Shortness of Breath.  Sonographer:    Merrie Roof Referring Phys: [id number] [place]  1. Left ventricular ejection fraction, by estimation, is 60 to 65%. The left ventricle has normal function. The left ventricle has no regional  wall motion abnormalities. There is severe asymmetric left ventricular hypertrophy of the apical segment. 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 Cardiac amyloid also on differential. Consider cardiac MRI for further evaluation.  2. Right ventricular systolic function is severely reduced. The right ventricular size is moderately enlarged. There is severely elevated pulmonary artery systolic pressure. The estimated right ventricular systolic pressure is 82.9 mmHg.  3. The mitral valve is normal in structure. Trivial mitral valve regurgitation.  4. The aortic valve is tricuspid. Aortic valve regurgitation is not visualized. Mild aortic stenosis. 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 Tricuspid valve regurgitation is moderate to severe.  6. Right atrial size was severely dilated.  7. Left atrial size was moderately dilated.  8. The inferior vena cava is dilated in size with <50% respiratory variability, suggesting right atrial pressure of 15 mmHg. FINDINGS  Left Ventricle: Left ventricular ejection fraction, by estimation, is 60 to 65%. The left ventricle has normal function. The left ventricle has no regional wall motion abnormalities. The left ventricular internal cavity size was small. There is severe asymmetric left ventricular hypertrophy of the apical segment. Left ventricular diastolic parameters are indeterminate. Right Ventricle: The right ventricular size is moderately enlarged. Right vetricular wall thickness was not well visualized. Right ventricular systolic function is severely reduced. There is severely elevated pulmonary artery systolic pressure. The tricuspid regurgitant velocity is 4.44 m/s, and with an assumed right atrial pressure of 15 mmHg, the estimated right ventricular systolic pressure is 93.7 mmHg. Left Atrium: Left atrial size was moderately  dilated. Right Atrium: Right atrial size was severely dilated. Pericardium: There is no evidence of pericardial effusion. Mitral Valve: The mitral valve is normal in structure. Trivial mitral valve regurgitation. Tricuspid Valve: The tricuspid valve is normal in structure. Tricuspid valve regurgitation is moderate to severe. Aortic Valve: The aortic valve is tricuspid. There is moderate calcification of the aortic valve. Aortic valve regurgitation is not visualized. Mild aortic stenosis is present. Aortic valve mean gradient measures 7.0 mmHg. Aortic valve peak gradient measures 11.6 mmHg. Aortic valve area, by VTI measures 1.54 cm. Pulmonic Valve: The pulmonic valve was not well visualized. Pulmonic valve regurgitation is trivial. Aorta: The aortic root is normal in size and structure. Venous: The inferior vena cava is dilated in size with less than 50% respiratory variability, suggesting right atrial pressure of 15 mmHg. IAS/Shunts: No atrial  level shunt detected by color flow Doppler.  LEFT VENTRICLE PLAX 2D LVIDd:         3.60 cm LVIDs:         2.50 cm LV PW:         1.00 cm LV IVS:        0.90 cm LVOT diam:     1.80 cm LV SV:         37 LV SV Index:   22 LVOT Area:     2.54 cm  RIGHT VENTRICLE          IVC RV Basal diam:  5.00 cm  IVC diam: 2.60 cm RV Mid diam:    4.10 cm LEFT ATRIUM           Index       RIGHT ATRIUM           Index LA diam:      4.60 cm 2.75 cm/m  RA Area:     32.00 cm LA Vol (A2C): 71.3 ml 42.70 ml/m RA Volume:   109.00 ml 65.27 ml/m LA Vol (A4C): 91.3 ml 54.67 ml/m  AORTIC VALVE AV Area (Vmax):    1.69 cm AV Area (Vmean):   1.39 cm AV Area (VTI):     1.54 cm AV Vmax:           170.00 cm/s AV Vmean:          121.000 cm/s AV VTI:            0.239 m AV Peak Grad:      11.6 mmHg AV Mean Grad:      7.0 mmHg LVOT Vmax:         113.00 cm/s LVOT Vmean:        66.100 cm/s LVOT VTI:          0.145 m LVOT/AV VTI ratio: 0.61  AORTA Ao Root diam: 3.20 cm TRICUSPID VALVE TR Peak grad:   78.9  mmHg TR Vmax:        444.00 cm/s  SHUNTS Systemic VTI:  0.14 m Systemic Diam: 1.80 cm Oswaldo Milian MD Electronically signed by Oswaldo Milian MD Signature Date/Time: 12/09/2020/2:11:14 PM    Final    Disposition   Pt is being discharged home today in good condition.  Follow-up Plans & Appointments    Follow-up Information    Deberah Pelton, NP Follow up on 01/06/2021.   Specialty: Cardiology Why: at 11:15 Contact information: [address] [place] [address] [place] [facility] up.   Specialty: Cardiology Why: You will need to go to the office next week to have your blood re-drawn.  Contact information: [place] [phone]             Discharge Instructions    (HEART FAILURE PATIENTS) Call MD:  Anytime you have any of the following symptoms: 1) 3 pound weight gain in 24 hours or 5 pounds in 1 week 2) shortness of breath, with or without a dry hacking cough 3) swelling in the hands, feet or stomach 4) if you have to sleep on extra pillows at night in order to breathe.   Complete by: As directed    Call MD for:  difficulty breathing, headache or visual disturbances   Complete by: As directed    Call MD for:  extreme fatigue   Complete by: As directed  Call MD for:  hives   Complete by: As directed    Call MD for:  persistant dizziness or light-headedness   Complete by: As directed    Call MD for:  persistant nausea and vomiting   Complete by: As directed    Call MD for:  redness, tenderness, or signs of infection (pain, swelling, redness, odor or green/yellow discharge around incision site)   Complete by: As directed    Call MD for:  severe uncontrolled pain   Complete by: As directed    Call MD for:  temperature >100.4   Complete by: As directed    Diet - low sodium heart healthy   Complete by: As directed    Discharge instructions   Complete by: As  directed    You will need to come to Dr. Jacalyn Lefevre office next week for lab work.   Increase activity slowly   Complete by: As directed       Discharge Medications   Allergies as of 12/14/2020      Reactions   Heparin    Previous history of hemorrhagic stroke.   Morphine And Related Nausea And Vomiting      Medication List    TAKE these medications   acetaminophen 500 MG tablet Commonly known as: TYLENOL Take 1,000 mg by mouth every 6 (six) hours as needed for mild pain.   albuterol 108 (90 Base) MCG/ACT inhaler Commonly known as: VENTOLIN HFA Inhale 2 puffs into the lungs every 6 (six) hours as needed for wheezing or shortness of breath.   anastrozole 1 MG tablet Commonly known as: ARIMIDEX Take 1 tablet (1 mg total) by mouth daily.   atorvastatin 10 MG tablet Commonly known as: LIPITOR TAKE ONE TABLET DAILY AT 6PM What changed:   how much to take  how to take this  when to take this  additional instructions   Brovana 15 MCG/2ML Nebu Generic drug: arformoterol USE 1 VIAL  IN  NEBULIZER TWICE  DAILY - (Morning And Evening) What changed: See the new instructions.   budesonide 0.5 MG/2ML nebulizer solution Commonly known as: PULMICORT TAKE 61m BY NEBULIZER IN THE MORNING ANDAT BEDTIME What changed: See the new instructions.   digoxin 0.125 MG tablet Commonly known as: LANOXIN Take 1 tablet (125 mcg total) by mouth daily.   diltiazem 30 MG tablet Commonly known as: Cardizem Take 1 tablet (30 mg total) by mouth 3 (three) times daily. What changed: additional instructions   fluorometholone 0.1 % ophthalmic suspension Commonly known as: FML Place 1 drop into both eyes every other day.   furosemide 80 MG tablet Commonly known as: LASIX Take 1 tablet (80 mg total) by mouth 2 (two) times daily. What changed:   medication strength  how much to take  additional instructions  Another medication with the same name was removed. Continue taking this  medication, and follow the directions you see here.   guaiFENesin 600 MG 12 hr tablet Commonly known as: MUCINEX Take 600 mg by mouth daily.   ipratropium 0.02 % nebulizer solution Commonly known as: ATROVENT Take 2.5 mLs (0.5 mg total) by nebulization 3 (three) times daily. DX: J44.9   latanoprost 0.005 % ophthalmic solution Commonly known as: XALATAN Place 1 drop into both eyes at bedtime.   lidocaine 5 % Commonly known as: LIDODERM Place 1 patch onto the skin daily. Remove & Discard patch within 12 hours or as directed by MD   MIRALAX PO Take 17 g by mouth daily.  OXYGEN Inhale 2 L into the lungs continuous.   pantoprazole 40 MG tablet Commonly known as: PROTONIX TAKE ONE TABLET DAILY   potassium chloride SA 20 MEQ tablet Commonly known as: KLOR-CON Take 10-20 mEq by mouth See admin instructions. Take 20 meq by mouth in the morning and 10 meq in the evening   Simbrinza 1-0.2 % Susp Generic drug: Brinzolamide-Brimonidine Place 1 drop into both eyes in the morning and at bedtime.   valACYclovir 1000 MG tablet Commonly known as: VALTREX Take 1,000 mg by mouth daily.            Durable Medical Equipment  (From admission, onward)         Start     Ordered   12/14/20 1128  DME Oxygen  Once       Question Answer Comment  Length of Need Lifetime   Mode or (Route) Nasal cannula   Liters per Minute 4   Oxygen conserving device Yes   Oxygen delivery system Gas      12/14/20 1128           Outstanding Labs/Studies   BMET in one week   Duration of Discharge Encounter   Greater than 30 minutes including physician time.  Signed, Kathyrn Drown, NP 12/14/2020, 11:30 AM  Personally seen and examined. Agree with above.  [facility] Cardiologist: Kirk Ruths, MD   Subjective   Feeling better.  Shortness of breath is much improved from admission, however still present at baseline.  Inpatient Medications    Scheduled Meds: . anastrozole  1  mg Oral Daily  . arformoterol  15 mcg Nebulization BID  . atorvastatin  10 mg Oral Daily  . budesonide  0.5 mg Nebulization BID  . digoxin  125 mcg Oral Q24H  . fluorometholone  1 drop Both Eyes Daily  . furosemide  80 mg Intravenous BID  . heparin  5,000 Units Subcutaneous Q8H  . ipratropium  0.5 mg Nebulization BID  . latanoprost  1 drop Both Eyes QHS  . lidocaine  2 patch Transdermal Q24H  . pantoprazole  40 mg Oral Daily  . polyethylene glycol  17 g Oral Daily   Continuous Infusions: PRN Meds: acetaminophen, albuterol, nitroGLYCERIN, ondansetron (ZOFRAN) IV   Vital Signs          Vitals:   12/13/20 1955 12/13/20 2315 12/14/20 0000 12/14/20 0345  BP:  (!) 86/56 102/65 (!) 94/57  Pulse:  85 86 82  Resp:  _0 Temp:  97.9 F (36.6 C)  97.8 F (36.6 C)  TempSrc:  Oral  Oral  SpO2: 94% 92% 94% 93%  Weight:    56.8 kg  Height:    _1  (1.651 m)    Intake/Output Summary (Last 24 hours) at 12/14/2020 0828 Last data filed at 12/14/2020 0500    Gross per 24 hour  Intake 297 ml  Output 1850 ml  Net -1553 ml   Last 3 Weights 12/14/2020 12/13/2020 12/12/2020  Weight (lbs) 125 lb 3.5 oz 130 lb 11.7 oz 133 lb 9.6 oz  Weight (kg) 56.8 kg 59.3 kg 60.6 kg      Telemetry    Well rate controlled atrial fibrillation- Personally Reviewed    Physical Exam   GEN:No acute distress.  Elderly Neck:No JVD Cardiac:RRR, 2/6 systolic murmur, no rubs, or gallops.  Respiratory: Decreased breath sounds especially right base.  Mildly increased work of breathing RJ:JOAC, nontender, non-distended  MS: Trace lower extremity edema; No deformity. Neuro:Nonfocal  Psych: Normal affect   Labs    High Sensitivity Troponin:   Last Labs   No results for input(s): TROPONINIHS in the last 720 hours.      Chemistry Last Labs          Recent Labs  Lab 12/08/20 2114 12/09/20 0028 12/12/20 1438 12/13/20 0540 12/14/20 0145  NA 137   < > 136 138 138   K 4.2   < > 3.6 4.2 3.9  CL 101   < > 92* 94* 94*  CO2 29   < > 34* 35* 36*  GLUCOSE 109*   < > 147* 82 90  BUN 24*   < > _0 CREATININE 1.03*   < > 0.91 0.78 0.91  CALCIUM 8.8*   < > 8.4* 8.7* 8.6*  PROT 6.3*  --   --   --   --   ALBUMIN 3.2*  --   --   --   --   AST 29  --   --   --   --   ALT 17  --   --   --   --   ALKPHOS 149*  --   --   --   --   BILITOT 1.4*  --   --   --   --   GFRNONAA 54*   < > >60 >60 >60  ANIONGAP 7   < > _1 < > = values in this interval not displayed.       Hematology Last Labs       Recent Labs  Lab 12/08/20 2114 12/09/20 0028  WBC 9.2 9.2  RBC 4.82 4.70  HGB 14.4 14.2  HCT 45.1 43.8  MCV 93.6 93.2  MCH 29.9 30.2  MCHC 31.9 32.4  RDW 16.4* 16.4*  PLT 303 336      BNP Last Labs      Recent Labs  Lab 12/08/20 2114  BNP 1,996.9*       DDimer  Last Labs   No results for input(s): DDIMER in the last 168 hours.     Radiology    Imaging Results (Last 48 hours)  No results found.    Cardiac Studies    Echocardiogram this admit -EF 65% with severe LVH and apical hypertrophy. Apical obliteration. Severe pulmonary hypertension with pulmonary pressures 94 mmHg. RV is quite dilated and hypocontractile.  Patient Profile     86 y.o. female here with acute on chronic diastolic heart failure, right pleural effusion with metastatic breast cancer intracranial hemorrhage history permanent atrial fibrillation  Assessment & Plan    Acute on chronic diastolic heart failure-EF 65 with severe LVH -Has been receiving Lasix 80 mg IV twice a day yesterday had 1.3 L net out.  Blood pressures currently soft this morning 94/57.  Creatinine remains stable at 0.91.  Potassium 3.9. -Weight is now down to 125 pounds or 56.8 kg.  On admit 135 pounds. -She was taking 60 a.m./40 p.m. of Lasix at home.  We will go ahead and change to 80 twice daily to hopefully help maintain fluid status.  Check basic metabolic profile in 1  to 2 weeks.  Metastatic breast cancer -Disease to lungs mediastinum antiestrogen therapy.  Potentially contributing to pleural effusion.  Appreciate critical care medicine reviewing.  Agree that pleural effusion likely would promptly reaccumulate without the addition of adequate diuresis like we had performed during this hospitalization.  We will try to maintain  this body weight.  COPD with cor pulmonale, secondary pulmonary hypertension, pleural effusion -On home O2 2.5 L. -Estimated pressures of 94 mmHg quite elevated on echocardiogram.  This is secondary to underlying lung disease.  These pressures likely have improved slightly with improvement of left atrial pressures after diuresis for diastolic dysfunction.  Permanent atrial fibrillation -On digoxin for rate control.  Blood pressure fairly soft.  No anticoagulation because of prior intracranial hemorrhage.  Previously declined watchman overall well rate controlled.  DNR/DNI.  We are ready for discharge.  Continue with 4 L of oxygen at home.  Discuss further with Dr. Elsworth Soho of pulmonary as outpatient.  Given her multitude of comorbidities, I think an outpatient palliative care consultation is in order if not already performed.  Her husband [name] also has leukemia.  This would be excellent for her to help her with care resources  Close follow-up in clinic in 1 to 2 weeks.  For questions or updates, please contact [facility] Please consult [email or web address] for contact info under        Signed, Candee Furbish, MD

## 2020-12-15 NOTE — TOC Progression Note (Signed)
Transition of Care [facility]) - Progression Note    Patient Details  Name: [name] MRN: [id number] Date of Birth: 05/02/1935  Transition of [facility]) CM/SW Contact  Zenon Mayo, RN Phone Number: 12/15/2020, 8:48 AM  Clinical Narrative:    NCM contacted Tiffany with [employer] to inform her that patient oxygen saturations are now 4 liters and the new order is in. Tiffany states they will go into epic and get the order and she will contact the patient.         Expected Discharge Plan and Services           Expected Discharge Date: 12/14/20                                     Social Determinants of Health (SDOH) Interventions    Readmission Risk Interventions No flowsheet data found.

## 2020-12-30 DIAGNOSIS — H401134 Primary open-angle glaucoma, bilateral, indeterminate stage: Secondary | ICD-10-CM | POA: Diagnosis not present

## 2021-01-03 DIAGNOSIS — Z85828 Personal history of other malignant neoplasm of skin: Secondary | ICD-10-CM | POA: Diagnosis not present

## 2021-01-03 DIAGNOSIS — L821 Other seborrheic keratosis: Secondary | ICD-10-CM | POA: Diagnosis not present

## 2021-01-03 DIAGNOSIS — L98499 Non-pressure chronic ulcer of skin of other sites with unspecified severity: Secondary | ICD-10-CM | POA: Diagnosis not present

## 2021-01-06 ENCOUNTER — Ambulatory Visit: Payer: Medicare Other | Admitting: General Practice

## 2021-01-12 ENCOUNTER — Other Ambulatory Visit: Payer: Self-pay | Admitting: Cardiology

## 2021-01-17 NOTE — Progress Notes (Signed)
Cardiology Clinic Note   Patient Name: [name] Date of Encounter: 01/18/2021  Primary Care Provider:  Janith Lima, MD Primary Cardiologist:  Susan Ruths, MD  Patient Profile    [name] 86 year old female presents to the clinic today for follow-up evaluation of her chronic diastolic CHF and permanent atrial fibrillation.  Past Medical History    Past Medical History:  Diagnosis Date  . Arthritis   . COPD (chronic obstructive pulmonary disease) ([place])   . Dyspnea on exertion   . GERD (gastroesophageal reflux disease)   . Glaucoma of both eyes   . History of atrial fibrillation without current medication 2010   POST SURG 2010--  PER PT NO ISSUES SINCE  . History of breast cancer    DX DCIS IN 2004--  S/P RIGHT MASTECTOMY , NO CHEMORADIATION---  NO RECURRENCE  . History of CHF (congestive heart failure)    POST SURG 2010  . History of hypertension   . History of shingles    02/ 2015--  back of neck and left flank--  no residual pain  . ICH (intracerebral hemorrhage) ([place]) 02/05/2015  . Nephrolithiasis   . Recurrent bladder papillary carcinoma ([place]) first dx 06/ 2014   s/p  turbt's  and instillation mitomycin c (chemo)//dx again in 2015-different type  . Stroke [facility])    Past Surgical History:  Procedure Laterality Date  . CATARACT EXTRACTION W/ INTRAOCULAR LENS  IMPLANT, BILATERAL    . CRANIECTOMY N/A 02/05/2015   Procedure: SUBOCCIPITAL CRANIECTOMY EVACUATION OF HEMATOMA;  Surgeon: Susan Lose, MD;  Location: [place] [facility];  Service: Neurosurgery;  Laterality: N/A;  . CYSTOSCOPY W/ RETROGRADES Bilateral 08/11/2014   Procedure: CYSTOSCOPY WITH BILATERAL RETROGRADE PYELOGRAM AND MITOMYCIN INSTILLATION;  Surgeon: Susan Frock, MD;  Location: [facility];  Service: Urology;  Laterality: Bilateral;  . CYSTOSCOPY W/ URETERAL STENT PLACEMENT Bilateral 03/26/2013   Procedure: CYSTOSCOPY WITH BILATERAL RETROGRADE PYELOGRAM/URETERAL STENT  PLACEMENT;  Surgeon: Susan Frock, MD;  Location: [facility];  Service: Urology;  Laterality: Bilateral;  . CYSTOSCOPY W/ URETERAL STENT PLACEMENT Left 05/13/2013   Procedure: CYSTOSCOPY WITH RETROGRADE PYELOGRAM/URETERAL STENT PLACEMENT STENT EXCHANGE;  Surgeon: Susan Frock, MD;  Location: [facility];  Service: Urology;  Laterality: Left;  . PARTIAL MASTECTOMY WITH NEEDLE LOCALIZATION Right 01-14-2003   DCIS  . REMOVAL CYST LEFT HAND  2013  . TOTAL KNEE ARTHROPLASTY Right 03-22-2009  . TOTAL MASTECTOMY Right 02-03-2003   W/ SLN BX  AND  POST 03-01-2003 EVACUATION HEMATOMA  . TOTAL MASTECTOMY Left 05/04/2019   Procedure: LEFT TOTAL MASTECTOMY;  Surgeon: Susan Skates, MD;  Location: [place];  Service: General;  Laterality: Left;  . TRANSTHORACIC ECHOCARDIOGRAM  03-25-2009   MILD LVF/ EF 70-80%/ MILD INCREASE SYSTOLIC PULMONARY  PRESSURE  . TRANSURETHRAL RESECTION OF BLADDER TUMOR WITH GYRUS (TURBT-GYRUS) N/A 03/26/2013   Procedure: TRANSURETHRAL RESECTION OF BLADDER TUMOR WITH GYRUS (TURBT-GYRUS);  Surgeon: Susan Frock, MD;  Location: [facility];  Service: Urology;  Laterality: N/A;  . TRANSURETHRAL RESECTION OF BLADDER TUMOR WITH GYRUS (TURBT-GYRUS) N/A 05/13/2013   Procedure: TRANSURETHRAL RESECTION OF BLADDER TUMOR WITH GYRUS (TURBT-GYRUS)  RE-STAGING TRANSURETHRAL RESECTION OF BLADDER TUMOR, LEFT RETROGRADE PYELOGRAM AND STENT EXCHANGE;  Surgeon: Susan Frock, MD;  Location[facility];  Service: Urology;  Laterality: N/A;  . TRANSURETHRAL RESECTION OF BLADDER TUMOR WITH GYRUS (TURBT-GYRUS) N/A 08/11/2014   Procedure: TRANSURETHRAL RESECTION OF BLADDER TUMOR WITH GYRUS (TURBT-GYRUS);  Surgeon: Susan Frock,  MD;  Location: [place];  Service: Urology;  Laterality: N/A;  . TUBAL LIGATION      Allergies  Allergies  Allergen Reactions  . Heparin     Previous history of hemorrhagic stroke.  Marland Kitchen Morphine  And Related Nausea And Vomiting    History of Present Illness    [name] has a PMH of atrial fibrillation. Her echocardiogram 02/2015 showed vigorous LV function, mild LVH, grade 1 DD, and mild right atrial enlargement. She was admitted 02/2015 with a large right cerebral hemorrhage. Her only anticoagulant at that time was aspirin. The hemorrhage required surgical intervention/evacuation. She developed atrial fibrillation during hospitalization which was difficult to control her heart rate. She was felt not to be a candidate for anticoagulation given her intracranial hemorrhage. A Holter 7/16 showed atrial fibrillation with elevated rate, digoxin was added. [name] contacted neurosurgery to see if she would be a candidate for short-term anticoagulation which might qualify her for a watchman device. Neurosurgery felt this could be pursued. During her telehealth visit 01/2019 she described unilateral left lower extremity edema and erythema. There was concern for possible DVT and she was scheduled for an ultrasound. The lower extremity ultrasound was performed 01/02/2019 and showed no lower extremity DVT .  She was last seen by [name] on 01/02/2019. During that time she was doing well and denied chest pain, palpitations, and syncope. She did notice some dyspnea with exertion and had lower extremity edema with erythema that was ruled out for DVT.  She presented the clinic 10/14/2019 and statedshe saw pulmonology 10/13/2019 who prescribed antibiotics, prednisone, extra Lasix, Mucinex, and continuation of her nebulizers. She had not taken her extra dose of Lasix that day due to needing to be able to make the cardiology appointment. She had not had any fevers or chills. She had noticed increased work of breathing over the last week. She did feel she had increased lower extremity edema. I will increased her Lasix for the next 3 days and gave her 20 mEq of potassium for the next 3 days.  She was having more difficulty with her daily activities due to her COPD, oxygen dependence, and heart failure. I order a wheelchair for her. Her oxygen remained at 2 L/min.  She was seen virtually in follow-up 10/22/2019 and stated she was feeling like she was almost back to her baseline with her breathing.  However, she noticed that her COPD had progressed over time.  Today she was using 2 L of oxygen.  Her weight was back down to her baseline at 149 pounds.  She was able to have lab work drawn earlier in the week and her renal function was stable.  She stated she had a follow-up appointment with pulmonology Tuesday for a follow-up evaluation of her COPD exacerbation.  She stated that she had much less swelling in her lower extremities however, she had chronic swelling in her left knee as a result of an injury that she suffered several years ago.  I  instructed her to resume her normal Lasix dosing, follow a low-sodium diet, weigh daily, and planned to see her back in 3 months.  She was admitted from the office on 12/08/2020 with acute on chronic diastolic CHF.  She was seen by [name] on 12/08/2020.  She was noted to be markedly fluid volume overloaded with worsening dyspnea on exertion, orthopnea and worsening bilateral lower extremity swelling.  She was noted to have pleuritic pain on her left side with  known right pleural effusion from CT on 3/22.  Her echocardiogram showed an LVEF of 65% with severe LVH and apical hypertrophy, severe pulmonary hypertension with pulmonary pressures of 94 mmHg and a hypocontractile RV.  She improved with IV diuresis.  She was admitted with a weight of 135 pounds and discharged at 125 pounds.  Her creatinine remained stable.  She was discharged on furosemide 80 mg p.o. twice daily.  She was discharged home on 4 L nasal cannula with plan follow-up with Dr. Elsworth Soho in the outpatient setting.  Her digoxin was continued.  She presents to the clinic today for follow-up  evaluation states her breathing has returned to preadmission baseline.  She reports that she has been using 4 L nasal cannula when she is out doing activities in 3 to 4 L at home when she is not as active.  She reports compliance with her diuretic medication.  Her weight has remained stable.  Her weight today was around 127.4 pounds.  She reports that she is unable to prepare her own food and frequently orders out.  She has been trying to choose low-sodium options.  I will order a BMP today, give her the salty 6 diet sheet, have her increase her physical activity as tolerated, and follow-up with [name] in 3 months.  Shedenies chest pain, increased shortness of breath, lower extremity edema, fatigue, palpitations, melena, hematuria, hemoptysis, diaphoresis, weakness, syncope, orthopnea, and PND.  Home Medications    Prior to Admission medications   Medication Sig Start Date End Date Taking? Authorizing Provider  acetaminophen (TYLENOL) 500 MG tablet Take 1,000 mg by mouth every 6 (six) hours as needed for mild pain.    [provider]  albuterol (VENTOLIN HFA) 108 (90 Base) MCG/ACT inhaler Inhale 2 puffs into the lungs every 6 (six) hours as needed for wheezing or shortness of breath. 07/21/20   Lauraine Rinne, NP  anastrozole (ARIMIDEX) 1 MG tablet Take 1 tablet (1 mg total) by mouth daily. 11/07/20   Truitt Merle, MD  atorvastatin (LIPITOR) 10 MG tablet TAKE ONE TABLET DAILY AT 6PM Patient taking differently: Take 10 mg by mouth daily. 01/12/20   [name], MD  Brinzolamide-Brimonidine [facility]) 1-0.2 % SUSP Place 1 drop into both eyes in the morning and at bedtime.    [provider]  BROVANA 15 MCG/2ML NEBU USE 1 VIAL  IN  NEBULIZER TWICE  DAILY - (Morning And Evening) Patient taking differently: Take 2 mLs by nebulization 2 (two) times daily. 11/07/20   Rigoberto Noel, MD  budesonide (PULMICORT) 0.5 MG/2ML nebulizer solution TAKE 19m BY NEBULIZER IN THE MORNING ANDAT  BEDTIME Patient taking differently: Take 0.5 mg by nebulization in the morning and at bedtime. 11/28/20   JJanith Lima MD  digoxin (LANOXIN) 0.125 MG tablet Take 1 tablet (125 mcg total) by mouth daily. 09/19/20   CLelon Perla MD  diltiazem (CARDIZEM) 30 MG tablet Take 1 tablet (30 mg total) by mouth 3 (three) times daily. Patient taking differently: Take 30 mg by mouth 3 (three) times daily. Takes only if bp is >100 11/10/20   ARigoberto Noel MD  fluorometholone (FML) 0.1 % ophthalmic suspension Place 1 drop into both eyes every other day.    [provider]  furosemide (LASIX) 80 MG tablet Take 1 tablet (80 mg total) by mouth 2 (two) times daily. 12/14/20   MTommie Raymond NP  guaiFENesin (MUCINEX) 600 MG 12 hr tablet Take 600 mg by  mouth daily.    [provider]  ipratropium (ATROVENT) 0.02 % nebulizer solution Take 2.5 mLs (0.5 mg total) by nebulization 3 (three) times daily. DX: J44.9 07/19/20   Lauraine Rinne, NP  latanoprost (XALATAN) 0.005 % ophthalmic solution Place 1 drop into both eyes at bedtime. Patient not taking: Reported on 12/09/2020    [provider]  lidocaine (LIDODERM) 5 % Place 1 patch onto the skin daily. Remove & Discard patch within 12 hours or as directed by MD 11/07/20   Truitt Merle, MD  OXYGEN Inhale 2 L into the lungs continuous.     [provider]  pantoprazole (PROTONIX) 40 MG tablet TAKE ONE TABLET DAILY Patient taking differently: Take 40 mg by mouth daily. 12/05/20   Susan Lima, MD  Polyethylene Glycol 3350 (MIRALAX PO) Take 17 g by mouth daily.    [provider]  potassium chloride SA (KLOR-CON) 20 MEQ tablet TAKE ONE TABLET TWICE DAILY 01/12/21   [name], MD  valACYclovir (VALTREX) 1000 MG tablet Take 1,000 mg by mouth daily.    [provider]    Family History    Family History  Problem Relation Age of Onset  . Colon cancer Mother 45  . Colon cancer Maternal Grandmother   . Stroke  Maternal Grandmother   . Breast cancer Maternal Aunt 67  . Cancer Maternal Aunt        ? ovarian  . Breast cancer Other 60       breast cancer, ovarian cancer  . Breast cancer Daughter 8  . Stroke Paternal Grandmother   . Prostate cancer Paternal Grandfather   . Heart attack Neg Hx   . Hypertension Neg Hx    She indicated that her mother is deceased. She indicated that her father is deceased. She indicated that her maternal grandmother is deceased. She indicated that her maternal grandfather is deceased. She indicated that her paternal grandmother is deceased. She indicated that her paternal grandfather is deceased. She indicated that all of her three daughters are alive. She indicated that her son is alive. She indicated that both of her maternal aunts are deceased. She indicated that her maternal uncle is deceased. She indicated that her paternal aunt is deceased. She indicated that her paternal uncle is deceased. She indicated that the status of her neg hx is unknown. She indicated that the status of her other is unknown.  Social History    Social History   Socioeconomic History  . Marital status: Married    Spouse name: Not on file  . Number of children: 4  . Years of education: Not on file  . Highest education level: Not on file  Occupational History  . Not on file  Tobacco Use  . Smoking status: Former Smoker    Packs/day: 1.00    Years: 40.00    Pack years: 40.00    Types: Cigarettes    Quit date: 03/20/2003    Years since quitting: 17.8  . Smokeless tobacco: Never Used  Vaping Use  . Vaping Use: Never used  Substance and Sexual Activity  . Alcohol use: No    Alcohol/week: 0.0 standard drinks  . Drug use: No  . Sexual activity: Not Currently    Partners: Male    Birth control/protection: Surgical    Comment: BTL  Other Topics Concern  . Not on file  Social History Narrative  . Not on file   Social Determinants of Health   Financial  Resource Strain: Not on  file  Food Insecurity: Not on file  Transportation Needs: Not on file  Physical Activity: Not on file  Stress: Not on file  Social Connections: Not on file  Intimate Partner Violence: Not on file     Review of Systems    General:  No chills, fever, night sweats or weight changes.  Cardiovascular:  No chest pain, dyspnea on exertion, edema, orthopnea, palpitations, paroxysmal nocturnal dyspnea. Dermatological: No rash, lesions/masses Respiratory: No cough, dyspnea Urologic: No hematuria, dysuria Abdominal:   No nausea, vomiting, diarrhea, bright red blood per rectum, melena, or hematemesis Neurologic:  No visual changes, wkns, changes in mental status. All other systems reviewed and are otherwise negative except as noted above.  Physical Exam    VS:  BP 100/62 (BP Location: Right Arm, Patient Position: Sitting, Cuff Size: Normal)   Pulse 66   Ht _0  (1.676 m)   Wt 127 lb 6.4 oz (57.8 kg)   LMP 12/03/2002   BMI 20.56 kg/m  , BMI Body mass index is 20.56 kg/m. GEN: Well nourished, well developed, in no acute distress. HEENT: normal. Neck: Supple, no JVD, carotid bruits, or masses. Cardiac: RRR, no murmurs, rubs, or gallops. No clubbing, cyanosis, edema.  Radials/DP/PT 2+ and equal bilaterally.  Respiratory:  Respirations regular and unlabored, clear to auscultation bilaterally. GI: Soft, nontender, nondistended, BS + x 4. MS: no deformity or atrophy. Skin: warm and dry, no rash. Neuro:  Strength and sensation are intact. Psych: Normal affect.  Accessory Clinical Findings    Recent Labs: 08/02/2020: NT-Pro BNP 4,565 12/08/2020: ALT 17; B Natriuretic Peptide 1,996.9; Magnesium 2.1; TSH 2.440 12/09/2020: Hemoglobin 14.2; Platelets 336 12/14/2020: BUN 16; Creatinine, Ser 0.91; Potassium 3.9; Sodium 138   Recent Lipid Panel    Component Value Date/Time   CHOL 121 04/19/2017 0936   TRIG 114 04/19/2017 0936   HDL 46 04/19/2017 0936   CHOLHDL 2.6 04/19/2017 0936   CHOLHDL 3  05/09/2016 1216   VLDL 36.8 05/09/2016 1216   LDLCALC 52 04/19/2017 0936    ECG personally reviewed by me today-none today. Echocardiogram 02/07/2015 Study Conclusions   - Procedure narrative: Transthoracic echocardiography. Image  quality was poor. The study was technically difficult, as a  result of poor acoustic windows, poor sound wave transmission,  and restricted patient mobility.  - Left ventricle: The cavity size was normal. Wall thickness was  increased in a pattern of mild LVH. Systolic function was  vigorous. The estimated ejection fraction was in the range of 65%  to 70%. Wall motion was normal; there were no regional wall  motion abnormalities. Doppler parameters are consistent with  abnormal left ventricular relaxation (grade 1 diastolic  dysfunction). The E/e&' ratio is between 8-15, suggesting  indeterminate LV filling pressure.  - Aortic valve: Poorly visualized. There was no stenosis. There was  no regurgitation.  - Mitral valve: Calcified annulus. There was trivial regurgitation.  - Left atrium: The atrium was normal in size.  - Right atrium: The atrium was mildly dilated.  Echocardiogram 12/09/2020 IMPRESSIONS    1. Left ventricular ejection fraction, by estimation, is 60 to 65%. The  left ventricle has normal function. The left ventricle has no regional  wall motion abnormalities. There is severe asymmetric left ventricular  hypertrophy of the apical segment.  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 Cardiac amyloid also on  differential. Consider cardiac MRI for further evaluation.  2. Right ventricular  systolic function is severely reduced. The right  ventricular size is moderately enlarged. There is severely elevated  pulmonary artery systolic pressure. The estimated right ventricular  systolic pressure is 45.4 mmHg.  3. The mitral valve is normal in  structure. Trivial mitral valve  regurgitation.  4. The aortic valve is tricuspid. Aortic valve regurgitation is not  visualized. Mild aortic stenosis. 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 Tricuspid valve regurgitation is moderate to severe.  6. Right atrial size was severely dilated.  7. Left atrial size was moderately dilated.  8. The inferior vena cava is dilated in size with <50% respiratory  variability, suggesting right atrial pressure of 15 mmHg.  Lower extremity Dopplers 01/02/2019 Summary:  Right: No evidence of common femoral vein obstruction.    Left: No evidence of deep vein thrombosis in the lower extremity. No  indirect evidence of obstruction proximal to the inguinal ligament. A  cystic structure is found in the popliteal fossa, measuring 3.9 x .60 s  2.0 cm. Inhomogeneous enlarged lymph node  adjacent to the proximal GSV in the groin/proximal thigh area, measuring  1.8 x 1.2 cm.   Assessment & Plan   1.  Acute on chronic diastolic CHF-much less edema today.Weight today 127.4 pounds.  Discharge weight was 125 pounds. She is having a harder time getting around doing daily activities and demonstrates poor activity tolerance.  Renal function is stable. Continue digoxin , furosemide, potassium  Heart healthy low-sodium diet-salty 6 given Increase physical activity as tolerated Continue fluid restriction Continue Daily weights-contact office with a 3 pound weight gain overnight or 5 pounds in 1 week. Order BMP  Permanent atrial fibrillation- heart rate today 66 bpm Not a candidate for anticoagulation due to intracranial hemorrhage on aspirin. This required surgical evacuation. Previously discussed possibility of short-term anticoagulation and placement of watchman device. Neurosurgery felt this would be a possibility. However, [name] did not want to go forward with this. She has  expressed understanding of higher risk of CVA. Continue Cardizem, digoxin Avoid triggers caffeine, chocolate, EtOH, OSA, etc.  Essential hypertension- BP today 100/62.   Well-controlled at home.  Continue diltiazem, furosemide  Heart healthy low-sodium diet-salty 6 Increase physical activity as tolerated  Hyperlipidemia-LDL 52 on 8/18 Continue atorvastatin  Heart healthy low-sodium high-fiber diet Increase physical activity as tolerated  COPD-Continues on 4 L oxygen . Reports her breathing is stable at this time.   Continue inhaler, nebulizers, oxygen Followed by pulmonary  Disposition: Follow-up with [name] in 3 months.  Jossie Ng. Tiannah Greenly NP-C    01/18/2021, 3:12 PM [facility] [place] [address] Office [phone] Fax [phone]  Notice: This dictation was prepared with Dragon dictation along with smaller phrase technology. Any transcriptional errors that result from this process are unintentional and may not be corrected upon review.  I spent 12 minutes examining this patient, reviewing medications, and using patient centered shared decision making involving her cardiac care.  Prior to her visit I spent greater than 20 minutes reviewing her past medical history,  medications, and prior cardiac tests.

## 2021-01-18 ENCOUNTER — Ambulatory Visit (INDEPENDENT_AMBULATORY_CARE_PROVIDER_SITE_OTHER): Payer: Medicare Other | Admitting: General Practice

## 2021-01-18 ENCOUNTER — Other Ambulatory Visit: Payer: Self-pay

## 2021-01-18 ENCOUNTER — Encounter: Payer: Self-pay | Admitting: General Practice

## 2021-01-18 VITALS — BP 100/62 | HR 66 | Ht 66.0 in | Wt 127.4 lb

## 2021-01-18 DIAGNOSIS — I10 Essential (primary) hypertension: Secondary | ICD-10-CM

## 2021-01-18 DIAGNOSIS — Z79899 Other long term (current) drug therapy: Secondary | ICD-10-CM

## 2021-01-18 DIAGNOSIS — I4821 Permanent atrial fibrillation: Secondary | ICD-10-CM

## 2021-01-18 DIAGNOSIS — I5033 Acute on chronic diastolic (congestive) heart failure: Secondary | ICD-10-CM | POA: Diagnosis not present

## 2021-01-18 DIAGNOSIS — E7800 Pure hypercholesterolemia, unspecified: Secondary | ICD-10-CM

## 2021-01-18 DIAGNOSIS — R0902 Hypoxemia: Secondary | ICD-10-CM | POA: Diagnosis not present

## 2021-01-18 DIAGNOSIS — J449 Chronic obstructive pulmonary disease, unspecified: Secondary | ICD-10-CM

## 2021-01-18 NOTE — Patient Instructions (Signed)
Medication Instructions:  The current medical regimen is effective;  continue present plan and medications as directed. Please refer to the Current Medication list given to you today.  *If you need a refill on your cardiac medications before your next appointment, please call your pharmacy*  Lab Work: BMET TODAY If you have labs (blood work) drawn today and your tests are completely normal, you will receive your results only by:  [place] (if you have MyChart) OR A paper copy in the mail.  If you have any lab test that is abnormal or we need to change your treatment, we will call you to review the results. You may go to any [facility] that is convenient for you however, we do have a lab in our office that is able to assist you. You DO NOT need an appointment for our lab. The lab is open 8:00am and closes at 4:00pm. Lunch 12:45 - 1:45pm.  Special Instructions CALL WITH ANY KNEE SWELLING  PLEASE READ AND FOLLOW SALTY 6-ATTACHED-1,800 mg daily  PLEASE INCREASE PHYSICAL ACTIVITY AS TOLERATED  Follow-Up: Your next appointment:  3 month(s) In Person with Kirk Ruths, MD OR IF UNAVAILABLE [place], FNP-C [facility], you and your health needs are our priority.  As part of our continuing mission to provide you with exceptional heart care, we have created designated Provider Care Teams.  These Care Teams include your primary Cardiologist (physician) and Advanced Practice Providers (APPs -  Physician Assistants and Nurse Practitioners) who all work together to provide you with the care you need, when you need it.  We recommend signing up for the patient portal called "MyChart".  Sign up information is provided on this After Visit Summary.  MyChart is used to connect with patients for Virtual Visits (Telemedicine).  Patients are able to view lab/test results, encounter notes, upcoming appointments, etc.  Non-urgent messages can be sent to your provider as well.   To learn more about what  you can do with MyChart, go to [email or web address].

## 2021-01-19 LAB — BASIC METABOLIC PANEL
BUN/Creatinine Ratio: 24 (ref 12–28)
BUN: 20 mg/dL (ref 8–27)
CO2: 29 mmol/L (ref 20–29)
Calcium: 9.2 mg/dL (ref 8.7–10.3)
Chloride: 97 mmol/L (ref 96–106)
Creatinine, Ser: 0.84 mg/dL (ref 0.57–1.00)
Glucose: 90 mg/dL (ref 65–99)
Potassium: 4.4 mmol/L (ref 3.5–5.2)
Sodium: 139 mmol/L (ref 134–144)
eGFR: 69 mL/min/{1.73_m2} (ref >59)

## 2021-01-23 ENCOUNTER — Other Ambulatory Visit: Payer: Self-pay | Admitting: Cardiology

## 2021-01-27 ENCOUNTER — Telehealth: Payer: Self-pay

## 2021-01-27 NOTE — Telephone Encounter (Signed)
While discussing pt lab results pt/daughter stated that she has a wound on her left ankle. Daughter, [name] (pt asked me to talk to her daughter)described wound as being about dime sized, red and swollen. It is not warm to the touch. She has had this wound for more than 3 weeks. She did not mention this while at her appt here 01-18-21. Informed pt/daughter that this is a problem for her PCP or she will have to go to the [facility] for this to be examined and treated. Verbalized understanding. She will go to the UC.

## 2021-02-01 NOTE — Progress Notes (Signed)
[name]   [phone]) [phone] [phone]   Clinic Follow up Note   Patient Care Team: Janith Lima, MD as PCP - General (Internal Medicine) Stanford Breed, Denice Bors, MD as PCP - Cardiology (Cardiology) Rockwell Germany, RN as Oncology Nurse Navigator Mauro Kaufmann, RN as Oncology Nurse Navigator Truitt Merle, MD as Consulting Physician (Hematology) Fanny Skates, MD as Consulting Physician (General Surgery) Kyung Rudd, MD as Consulting Physician (Radiation Oncology) Alexis Frock, MD as Consulting Physician (Anesthesiology) Alexis Frock, MD as Consulting Physician (Urology) [name], MD as Consulting Physician (Cardiology) Rigoberto Noel, MD as Consulting Physician (Pulmonary Disease)  Date of Service:  02/06/2021  CHIEF COMPLAINT: F/u of left breast cancer  SUMMARY OF ONCOLOGIC HISTORY: Oncology History Overview Note  Cancer Staging Malignant neoplasm of upper-outer quadrant of left breast in female, estrogen receptor positive ([place]) Staging form: Breast, AJCC 8th Edition - Clinical stage from 04/01/2019: Stage IA (cT1c, cN0, cM0, G2, ER+, PR+, HER2-) - Signed by Truitt Merle, MD on 04/07/2019 - Pathologic stage from 05/04/2019: Stage Unknown (pT2, pNX, cM0, G2, ER+, PR+, HER2-) - Signed by Truitt Merle, MD on 05/27/2019    Malignant neoplasm of upper-outer quadrant of left breast in female, estrogen receptor positive ([place])  03/30/2019 Mammogram   Left diagnostic mammogram and [place] 03/30/19  IMPRESSION The 1.6cm heterogenous focal area in the left breast at 1:00-2:00 4-6cm form the nipple, corresponding to the hard palpable area in the left breast is suspicious of malignancy, particularly lobular carcinoma.     04/01/2019 Cancer Staging   Staging form: Breast, AJCC 8th Edition - Clinical stage from 04/01/2019: Stage IA (cT1c, cN0, cM0, G2, ER+, PR+, HER2-) - Signed by Truitt Merle, MD on 04/07/2019   04/01/2019 Initial Biopsy   Diagnosis 04/01/19 Breast,  left, needle core biopsy, 2 o'clock, 5cmfn - INVASIVE MAMMARY CARCINOMA, SEE COMMENT. - MAMMARY CARCINOMA IN SITU.   04/01/2019 Receptors her2   The tumor cells are NEGATIVE for Her2 (1+). Estrogen Receptor: 100%, POSITIVE, STRONG STAINING INTENSITY Progesterone Receptor: 30%, POSITIVE, STRONG STAINING INTENSITY Proliferation Marker Ki67: 20%   04/03/2019 Initial Diagnosis   Malignant neoplasm of upper-outer quadrant of left breast in female, estrogen receptor positive (H[facility])    Genetic Testing   Negative genetic testing. No pathogenic variants identified on the [facility] Breast Cancer STAT Panel + Common Hereditary Cancers Panel. The STAT Breast cancer panel offered by [facility] includes sequencing and rearrangement analysis for the following 9 genes:  ATM, BRCA1, BRCA2, CDH1, CHEK2, PALB2, PTEN, STK11 and TP53.  The Common Hereditary Cancers Panel offered by [facility] includes sequencing and/or deletion duplication testing of the following 47 genes: APC, ATM, AXIN2, BARD1, BMPR1A, BRCA1, BRCA2, BRIP1, CDH1, CDKN2A (p14ARF), CDKN2A (p16INK4a), CKD4, CHEK2, CTNNA1, DICER1, EPCAM (Deletion/duplication testing only), GREM1 (promoter region deletion/duplication testing only), KIT, MEN1, MLH1, MSH2, MSH3, MSH6, MUTYH, NBN, NF1, NHTL1, PALB2, PDGFRA, PMS2, POLD1, POLE, PTEN, RAD50, RAD51C, RAD51D,  SDHB, SDHC, SDHD, SMAD4, SMARCA4. STK11, TP53, TSC1, TSC2, and VHL.  The following genes were evaluated for sequence changes only: SDHA and HOXB13 c.251G>A variant only. The report date is 04/17/2019.    05/04/2019 Surgery    LEFT TOTAL MASTECTOMY by Dr. Dalbert Batman 05/04/19   05/04/2019 Pathology Results   Diagnosis 05/04/19 1. Breast, excision - INVASIVE LOBULAR CARCINOMA, NOTTINGHAM GRADE 2 - SEE COMMENT 2. Breast, simple mastectomy, Left total - MULTIFOCAL, INVASIVE LOBULAR CARCINOMA, NOTTINGHAM GRADE 2 OF 3, 2.1 CM - MARGINS UNINVOLVED BY CARCINOMA (0.2 CM; POSTERIOR MARGIN) -  LYMPHOVASCULAR SPACE INVASION  PRESENT - PREVIOUS BIOPSY SITE CHANGES PRESENT - SEBORRHEIC KERATOSIS - SEE ONCOLOGY TABLE AND COMMENT BELOW   05/04/2019 Cancer Staging   Staging form: Breast, AJCC 8th Edition - Pathologic stage from 05/04/2019: Stage Unknown (pT2, pNX, cM0, G2, ER+, PR+, HER2-) - Signed by Truitt Merle, MD on 05/27/2019   05/31/2020 Breast US   Left Breast US 05/31/20 Multiple, raised, hard, pink lesions in the skin of th left chest surrounding the mastectomy scar. There is one inferior and lateral to the scar which has a corresponding 1.1cm vascular mass in teh skin at [place]. Dermatology consult for skin biopsy of the lesion is recommended to exclude recurrent breast cancer to the skin.    06/17/2020 Relapse/Recurrence   PUNCH BIOPSY by Dermatologist   Diagnosis 1. Skin Biopsy-(P), A) right inferior lateral antecubital shave & ED&C SEBORRHEIC KERATOSIS, IRRITATED  2. Skin Biopsy-(P), B) left inferior central chest punch  POORLY DIFFERENTIATED ADENOCARCINOMA CONSISTENT WITH BREAST ORIGIN INVOLVING THE DERMIS, SEE DESCRIPTION   HER2 NEGATIVE  ER Positive >95% PR NEGATIVE     06/27/2020 -  Anti-estrogen oral therapy   First-line Exemestane 1m once daily starting 06/25/20. Switched to Anastrozole in 09/2020 given skin pruritus      07/08/2020 PET scan   IMPRESSION: 1. No metabolic activity associated with nodularity along the mastectomy scar in the LEFT chest wall. 2. Two hypermetabolic RIGHT supraclavicular lymph nodes. Hypermetabolic activity may relate to RIGHT arm COVID vaccination of 10/ 03/2020. Estradiol FDG PET scan may differentiate versus attention on follow-up versus biopsy. 3. No evidence distant metastatic disease. 4. Moderate RIGHT pleural effusion and basilar atelectasis.   08/05/2020 PET scan   IMPRESSION: 1. No estradiol receptor radiotracer accumulation within the RIGHT supraclavicular lymph nodes which were hypermetabolic on comparison FDG PET scan. Favor activity related to COVID  vaccination. 2. Linear band cutaneous radiotracer activity within the LEFT chest wall presumably relates to estrogen positive punch biopsies performed by dermatologist. 3. No clear evidence of metastatic estrogen positive breast cancer on the remaining whole-body scan. 4. Intense radiotracer activity within the mediastinum and thoracic inlet without clear corresponding CT findings. Favor benign physiologic activity. 5. Intense radiotracer activity associated with LEFT RIGHT lower lobe atelectasis is also favored benign physiologic activity. 6. Stable bilateral pleural effusions.   ADDENDUM: Although there is scant lymphoid tissue and small nodes associated with the intense mediastinal F 18 estradiol activity, upon conferring with MD vendor expert, concern exists for invasive lobular carcinoma to the mediastinum.   08/05/2020 PET scan   [place] PET IMPRESSION: 1. No estradiol receptor radiotracer accumulation within the RIGHT supraclavicular lymph nodes which were hypermetabolic on comparison FDG PET scan. Favor activity related to COVID vaccination. 2. Linear band cutaneous radiotracer activity within the LEFT chest wall presumably relates to estrogen positive punch biopsies performed by dermatologist. 3. No clear evidence of metastatic estrogen positive breast cancer on the remaining whole-body scan. 4. Intense radiotracer activity within the mediastinum and thoracic inlet without clear corresponding CT findings. Favor benign physiologic activity. 5. Intense radiotracer activity associated with LEFT RIGHT lower lobe atelectasis is also favored benign physiologic activity. 6. Stable bilateral pleural effusions.     ADDENDUM: Although there is scant lymphoid tissue and small nodes associated with the intense mediastinal F 18 estradiol activity, upon conferring with MD vendor expert, concern exists for invasive lobular carcinoma to the mediastinum.   Recommend sampling  either the small RIGHT thoracic inlet lymph nodes/supraclavicular nodes or potentially bronchoscopy with sampling  of the subcarinal intense reactive lymphoid tissue.      CURRENT THERAPY:  First-line Exemestane 25m once daily starting 06/25/20. Switched to Anastrozole in 09/2020 givenskin pruritus  INTERVAL HISTORY:  BANGELIE KRAMis here for a follow up. She was last seen by me 2 months ago. She presents to the clinic with her daughter. Due to COPD exacerbation she was hospitalized for 6 days to reduce fluid retention. She is on currently on 4 liter oxygen continuously. She notes her LE edema is mainly in her knees. She has darkened toes and a wound on the inner left ankle. She notes she will occasionally elevating her feet. She will have throbbing of her wound that effects her sleep. She notes her son lives 2 blocks away and at her home helping both parents multiple times a day. I reviewed her medication list with her.     REVIEW OF SYSTEMS:   Constitutional: Denies fevers, chills or abnormal weight loss Eyes: Denies blurriness of vision Ears, nose, mouth, throat, and face: Denies mucositis or sore throat Respiratory: Denies cough, dyspnea or wheezes (+) on continuous nasal canula with 4 liters oxygen  Cardiovascular: Denies palpitation, chest discomfort or lower extremity swelling Gastrointestinal:  Denies nausea, heartburn or change in bowel habits Skin: Denies abnormal skin rashes (+) Wound of inner left ankle  Lymphatics: Denies new lymphadenopathy or easy bruising Neurological:Denies numbness, tingling or new weaknesses Behavioral/Psych: Mood is stable, no new changes  All other systems were reviewed with the patient and are negative.  MEDICAL HISTORY:  Past Medical History:  Diagnosis Date  . Arthritis   . COPD (chronic obstructive pulmonary disease) ([place]   . Dyspnea on exertion   . GERD (gastroesophageal reflux disease)   . Glaucoma of both eyes   . History of  atrial fibrillation without current medication 2010   POST SURG 2010--  PER PT NO ISSUES SINCE  . History of breast cancer    DX DCIS IN 2004--  S/P RIGHT MASTECTOMY , NO CHEMORADIATION---  NO RECURRENCE  . History of CHF (congestive heart failure)    POST SURG 2010  . History of hypertension   . History of shingles    02/ 2015--  back of neck and left flank--  no residual pain  . I[place] ([place]rebral hemorrhage) ([place] 02/05[place]  . Nephrolithiasis   . Recurrent bladder papillary carcinoma ([place] first dx 06/ 2014   s/p  turbt's  and instillation mitomycin c (chemo)//dx again in 2015-different type  . Stroke ([facility]     SURGICAL HISTORY: Past Surgical History:  Procedure Laterality Date  . CATARACT EXTRACTION W/ INTRAOCULAR LENS  IMPLANT, BILATERAL    . CRANIECTOMY N/A 02/05/2015   Procedure: SUBOCCIPITAL CRANIECTOMY EVACUATION OF HEMATOMA;  Surgeon: NConsuella Lose MD;  [facility];  Service: Neurosurgery;  Laterality: N/A;  . CYSTOSCOPY W/ RETROGRADES Bilateral 08/11/2014   Procedure: CYSTOSCOPY WITH BILATERAL RETROGRADE PYELOGRAM AND MITOMYCIN INSTILLATION;  Surgeon: TAlexis Frock MD;  [facility]  Service: Urology;  Laterality: Bilateral;  . CYSTOSCOPY W/ URETERAL STENT PLACEMENT Bilateral 03/26/2013   Procedure: CYSTOSCOPY WITH BILATERAL RETROGRADE PYELOGRAM/URETERAL STENT PLACEMENT;  Surgeon: TAlexis Frock MD;  [facility]  Service: Urology;  Laterality: Bilateral;  . CYSTOSCOPY W/ URETERAL STENT PLACEMENT Left 05/13/2013   Procedure: CYSTOSCOPY WITH RETROGRADE PYELOGRAM/URETERAL STENT PLACEMENT STENT EXCHANGE;  Surgeon: TAlexis Frock MD;  [facility]  Service: Urology;  Laterality: Left;  . PARTIAL MASTECTOMY WITH NEEDLE LOCALIZATION  Right 01-14-2003   DCIS  . REMOVAL CYST LEFT HAND  2013  . TOTAL KNEE ARTHROPLASTY Right 03-22-2009  . TOTAL MASTECTOMY Right 02-03-2003   W/ SLN BX  AND  POST  03-01-2003 EVACUATION HEMATOMA  . TOTAL MASTECTOMY Left 05/04/2019   Procedure: LEFT TOTAL MASTECTOMY;  Surgeon: Fanny Skates, MD;  Location: [place];  Service: General;  Laterality: Left;  . TRANSTHORACIC ECHOCARDIOGRAM  03-25-2009   MILD LVF/ EF 70-80%/ MILD INCREASE SYSTOLIC PULMONARY  PRESSURE  . TRANSURETHRAL RESECTION OF BLADDER TUMOR WITH GYRUS (TURBT-GYRUS) N/A 03/26/2013   Procedure: TRANSURETHRAL RESECTION OF BLADDER TUMOR WITH GYRUS (TURBT-GYRUS);  Surgeon: Alexis Frock, MD;  Location: [facility];  Service: Urology;  Laterality: N/A;  . TRANSURETHRAL RESECTION OF BLADDER TUMOR WITH GYRUS (TURBT-GYRUS) N/A 05/13/2013   Procedure: TRANSURETHRAL RESECTION OF BLADDER TUMOR WITH GYRUS (TURBT-GYRUS)  RE-STAGING TRANSURETHRAL RESECTION OF BLADDER TUMOR, LEFT RETROGRADE PYELOGRAM AND STENT EXCHANGE;  Surgeon: Alexis Frock, MD;  Location[facility];  Service: Urology;  Laterality: N/A;  . TRANSURETHRAL RESECTION OF BLADDER TUMOR WITH GYRUS (TURBT-GYRUS) N/A 08/11/2014   Procedure: TRANSURETHRAL RESECTION OF BLADDER TUMOR WITH GYRUS (TURBT-GYRUS);  Surgeon: Alexis Frock, MD;  Location: [facility];  Service: Urology;  Laterality: N/A;  . TUBAL LIGATION      I have reviewed the social history and family history with the patient and they are unchanged from previous note.  ALLERGIES:  is allergic to heparin and morphine and related.  MEDICATIONS:  Current Outpatient Medications  Medication Sig Dispense Refill  . acetaminophen (TYLENOL) 500 MG tablet Take 1,000 mg by mouth every 6 (six) hours as needed for mild pain.    Marland Kitchen albuterol (VENTOLIN HFA) 108 (90 Base) MCG/ACT inhaler Inhale 2 puffs into the lungs every 6 (six) hours as needed for wheezing or shortness of breath. 8 g 2  . anastrozole (ARIMIDEX) 1 MG tablet Take 1 tablet (1 mg total) by mouth daily. 90 tablet 1  . atorvastatin (LIPITOR) 10 MG tablet TAKE ONE TABLET DAILY AT 6PM 90 tablet  3  . Brinzolamide-Brimonidine (SIMBRINZA) 1-0.2 % SUSP Place 1 drop into both eyes in the morning and at bedtime.    Marland Kitchen BROVANA 15 MCG/2ML NEBU USE 1 VIAL  IN  NEBULIZER TWICE  DAILY - (Morning And Evening) (Patient taking differently: Take 2 mLs by nebulization 2 (two) times daily.) 120 mL 5  . budesonide (PULMICORT) 0.5 MG/2ML nebulizer solution TAKE 50m BY NEBULIZER IN THE MORNING ANDAT BEDTIME (Patient taking differently: Take 0.5 mg by nebulization in the morning and at bedtime.) 120 mL 1  . digoxin (LANOXIN) 0.125 MG tablet Take 1 tablet (125 mcg total) by mouth daily. 90 tablet 2  . diltiazem (CARDIZEM) 30 MG tablet Take 1 tablet (30 mg total) by mouth 3 (three) times daily. (Patient taking differently: Take 30 mg by mouth 3 (three) times daily. Takes only if bp is >100) 90 tablet 1  . fluorometholone (FML) 0.1 % ophthalmic suspension Place 1 drop into both eyes every other day.    . furosemide (LASIX) 80 MG tablet Take 1 tablet (80 mg total) by mouth 2 (two) times daily. 120 tablet 2  . guaiFENesin (MUCINEX) 600 MG 12 hr tablet Take 600 mg by mouth daily.    .Marland Kitchenipratropium (ATROVENT) 0.02 % nebulizer solution Take 2.5 mLs (0.5 mg total) by nebulization 3 (three) times daily. DX: J44.9 225 mL 5  . latanoprost (XALATAN) 0.005 % ophthalmic solution Place 1 drop  into both eyes at bedtime.    . OXYGEN Inhale 2 L into the lungs continuous.     . pantoprazole (PROTONIX) 40 MG tablet TAKE ONE TABLET DAILY (Patient taking differently: Take 40 mg by mouth daily.) 90 tablet 1  . Polyethylene Glycol 3350 (MIRALAX PO) Take 17 g by mouth daily.    . potassium chloride SA (KLOR-CON) 20 MEQ tablet TAKE ONE TABLET TWICE DAILY 60 tablet 8  . ROCKLATAN 0.02-0.005 % SOLN Apply 1 drop to eye at bedtime.    . valACYclovir (VALTREX) 1000 MG tablet Take 1,000 mg by mouth daily.     No current facility-administered medications for this visit.    PHYSICAL EXAMINATION: ECOG PERFORMANCE STATUS: 3 - Symptomatic, >50%  confined to bed  Vitals:   02/06/21 1437  BP: (!) 89/61  Pulse: (!) 58  Resp: 19  Temp: 97.6 F (36.4 C)  SpO2: 91%   Filed Weights   02/06/21 1437  Weight: 131 lb 8 oz (59.6 kg)    GENERAL:alert, no distress and comfortable SKIN: skin color, texture, turgor are normal, no rashes or significant lesions  (+)Skin ulcer of right foot medial side, now wound.  EYES: normal, Conjunctiva are pink and non-injected, sclera clear  NECK: supple, thyroid normal size, non-tender, without nodularity LYMPH:  no palpable lymphadenopathy in the cervical, axillary  LUNGS: clear to auscultation and percussion with normal breathing effort HEART: regular rate & rhythm and no murmurs and no lower extremity edema ABDOMEN:abdomen soft, non-tender and normal bowel sounds Musculoskeletal:no cyanosis of digits and no clubbing  NEURO: alert & oriented x 3 with fluent speech, no focal motor/sensory deficits BREAST:S/pbilateralmastectomy with surgical incision healed well. (+) patchy pinky skin rashes/softtissue nodularity along mastectomy incision line and lower chest wall, now much improved EXAM PERFORMED IN WHEELCHAIR TODAY   LABORATORY DATA:  I have reviewed the data as listed CBC Latest Ref Rng & Units 02/06/2021 12/09/2020 12/08/2020  WBC 4.0 - 10.5 K/uL 7.8 9.2 9.2  Hemoglobin 12.0 - 15.0 g/dL 14.1 14.2 14.4  Hematocrit 36.0 - 46.0 % 44.6 43.8 45.1  Platelets 150 - 400 K/uL 241 336 303     CMP Latest Ref Rng & Units 02/06/2021 01/18/2021 12/14/2020  Glucose 70 - 99 mg/dL 87 90 90  BUN 8 - 23 mg/dL _0 Creatinine 0.44 - 1.00 mg/dL 0.82 0.84 0.91  Sodium 135 - 145 mmol/L 140 139 138  Potassium 3.5 - 5.1 mmol/L 3.9 4.4 3.9  Chloride 98 - 111 mmol/L 100 97 94(L)  CO2 22 - 32 mmol/L 30 29 36(H)  Calcium 8.9 - 10.3 mg/dL 8.9 9.2 8.6(L)  Total Protein 6.5 - 8.1 g/dL 6.6 - -  Total Bilirubin 0.3 - 1.2 mg/dL 1.3(H) - -  Alkaline Phos 38 - 126 U/L 166(H) - -  AST 15 - 41 U/L 25 - -  ALT 0 - 44 U/L  17 - -      RADIOGRAPHIC STUDIES: I have personally reviewed the radiological images as listed and agreed with the findings in the report. No results found.   ASSESSMENT & PLAN:  [name] is a 86 y.o. female with    1.Malignant neoplasm of upper-outer quadrant of left breast,lobularcarcinoma, Stage 1A pT2cN0M0, ER/PR+, HER2-,Grade II, chest wall skin recurrence 9/2021ER+/PR+/HER2- -She was diagnosed in 03/2019. She underwent left totalmastectomyby Dr Dalbert Batman on 05/04/19. -Genetictesting was negative for pathogenetic mutations -Oncotype was not recommend due to her advanced age andmedical comorbidities -Givenearly stage disease,post mastectomy radiationwasnot  recommended.  -Patientinitiallydeclined antiestrogen therapy due to age and medical co-morbidities -She unfortunately developed local skin recurrence from breast cancerin 06/2020.Punch biopsy was done by her dermatologiston 06/17/20. Her 12/3/21[place] PET scanshowedextensive hypermetabolicuptake in mediastinal nodes and alonglungs bases, R>L, which are likely thoracic metastasis, given the septicity of [place] PET. -I started her on first line Exemestane 32m once daily starting 06/27/20.Due toskin pruritus(likely reaction) I switched her to Anastrozole in 09/2020. She is tolerating well.  -She previously declined another [place] PET scan due to poor toleration. Her CT Chest from 12/06/20 showed stable/slightly increase right pleural effusion, no other pulmonary lesion or enlarged nodes. -From a breast cancer standpoint, she is clinically doing well. Lab reviewed, her CBC and CMP are within normal limits except albumin 3.2, alk phos 166, Tbili 1.3. Breast exam is mostly stable. There is no clinical concern for recurrence. -Continue Anastrozole  -F/u in 2.5 months. She will continue to focus on her other comorbidities.  -next restaging scan in Oct 2022   2. Pleural effusion, COPD  -She has b/l  effusion on 08/05/20 PET scan.  -She has had thoracentesis in the past due to her COPD and Afib. She remains on continuous supplemental oxygen. Will order thoracentesis if indicated. -Her 08/05/20 [place] PET showedextensive hypermetabolicuptake in mediastinal nodes and alonglungs bases, R>L, which are likely thoracic metastasis. -She notes being more SOB for the past 1-2 months and has had episodes of COPD exacerbation and worsened pleural effusion.  -She remains on supplemental oxygen, now 4 liters (02/06/21). F/u with Dr AEartha Inchand Dr CStanford Breed   3. Comorbidities:H/o of ICH and stroke, S/p craniotomy in 02/2015,HTN, Afib, COPD, Shingles and GERD and arthritis,H/o of Recurrent bladder papillary carcinomaDx in 03/2013 and 08/2014 -Managed by cardiologist Dr. CStanford Breed PCP and Pulmonologist Dr Alva,Urologist Dr. MTresa Moore-She has been hypotensive in clinic since March 2022. I also recommend she check her BP at home daily and f/u with cardiologist. She notes her cardiologist instructed her to hold Lasix with BP under 100.    4. Left upper Back Pain -She notes Left upper back pain of her back for the past 2 months. This has impacted her sleep.  -She notes attributes part of this to the abnormal curvature of her spine. She has no skeletal mets on her 08/05/20 PET scan.  -I discussed calling Lidocaine patch and she can use heating pad. will monitor.   5. Left inner ankle wound  -She has a wound present for about a month. She has slow healing due to her poor circulation. This throbs at night and effects her sleep.  -I recommend she see Wound clinic for management. She is agreeable. I recommend she keep area dry and clean.    PLAN: -Continue anastrozole. I refilled today  -Lab and F/u in 2.5 months  -Send wound clinic referral for chronic left ankle wound    No problem-specific Assessment & Plan notes found for this encounter.   Orders Placed This Encounter  Procedures  .  Ambulatory referral to Wound Clinic    Referral Priority:   Routine    Referral Type:   Consultation    Referral Reason:   Specialty Services Required    Requested Specialty:   Wound Care    Number of Visits Requested:   1   All questions were answered. The patient knows to call the clinic with any problems, questions or concerns. No barriers to learning was detected. The total time spent in the appointment was 30 minutes.  Truitt Merle, MD 02/06/2021   I, Joslyn Devon, am acting as scribe for Truitt Merle, MD.   I have reviewed the above documentation for accuracy and completeness, and I agree with the above.

## 2021-02-06 ENCOUNTER — Encounter: Payer: Self-pay | Admitting: Hematology

## 2021-02-06 ENCOUNTER — Other Ambulatory Visit: Payer: Self-pay

## 2021-02-06 ENCOUNTER — Inpatient Hospital Stay (HOSPITAL_BASED_OUTPATIENT_CLINIC_OR_DEPARTMENT_OTHER): Payer: Medicare Other | Admitting: Hematology

## 2021-02-06 ENCOUNTER — Inpatient Hospital Stay: Payer: Medicare Other | Attending: Hematology

## 2021-02-06 VITALS — BP 89/61 | HR 58 | Temp 97.6°F | Resp 19 | Ht 66.0 in | Wt 131.5 lb

## 2021-02-06 DIAGNOSIS — Z170 Estrogen receptor positive status [ER+]: Secondary | ICD-10-CM | POA: Diagnosis not present

## 2021-02-06 DIAGNOSIS — C50812 Malignant neoplasm of overlapping sites of left female breast: Secondary | ICD-10-CM | POA: Insufficient documentation

## 2021-02-06 DIAGNOSIS — I4891 Unspecified atrial fibrillation: Secondary | ICD-10-CM | POA: Insufficient documentation

## 2021-02-06 DIAGNOSIS — Z9981 Dependence on supplemental oxygen: Secondary | ICD-10-CM | POA: Diagnosis not present

## 2021-02-06 DIAGNOSIS — J449 Chronic obstructive pulmonary disease, unspecified: Secondary | ICD-10-CM | POA: Insufficient documentation

## 2021-02-06 DIAGNOSIS — Z7901 Long term (current) use of anticoagulants: Secondary | ICD-10-CM | POA: Diagnosis not present

## 2021-02-06 DIAGNOSIS — C50412 Malignant neoplasm of upper-outer quadrant of left female breast: Secondary | ICD-10-CM

## 2021-02-06 DIAGNOSIS — Z993 Dependence on wheelchair: Secondary | ICD-10-CM | POA: Diagnosis not present

## 2021-02-06 DIAGNOSIS — J90 Pleural effusion, not elsewhere classified: Secondary | ICD-10-CM | POA: Diagnosis not present

## 2021-02-06 LAB — CMP (CANCER CENTER ONLY)
ALT: 17 U/L (ref 0–44)
AST: 25 U/L (ref 15–41)
Albumin: 3.2 g/dL — ABNORMAL LOW (ref 3.5–5.0)
Alkaline Phosphatase: 166 U/L — ABNORMAL HIGH (ref 38–126)
Anion gap: 10 (ref 5–15)
BUN: 22 mg/dL (ref 8–23)
CO2: 30 mmol/L (ref 22–32)
Calcium: 8.9 mg/dL (ref 8.9–10.3)
Chloride: 100 mmol/L (ref 98–111)
Creatinine: 0.82 mg/dL (ref 0.44–1.00)
GFR, Estimated: >60 mL/min (ref >60)
Glucose, Bld: 87 mg/dL (ref 70–99)
Potassium: 3.9 mmol/L (ref 3.5–5.1)
Sodium: 140 mmol/L (ref 135–145)
Total Bilirubin: 1.3 mg/dL — ABNORMAL HIGH (ref 0.3–1.2)
Total Protein: 6.6 g/dL (ref 6.5–8.1)

## 2021-02-06 LAB — CBC WITH DIFFERENTIAL (CANCER CENTER ONLY)
Abs Immature Granulocytes: 0.02 10*3/uL (ref 0.00–0.07)
Basophils Absolute: 0.1 10*3/uL (ref 0.0–0.1)
Basophils Relative: 1 %
Eosinophils Absolute: 0 10*3/uL (ref 0.0–0.5)
Eosinophils Relative: 0 %
HCT: 44.6 % (ref 36.0–46.0)
Hemoglobin: 14.1 g/dL (ref 12.0–15.0)
Immature Granulocytes: 0 %
Lymphocytes Relative: 18 %
Lymphs Abs: 1.4 10*3/uL (ref 0.7–4.0)
MCH: 29.7 pg (ref 26.0–34.0)
MCHC: 31.6 g/dL (ref 30.0–36.0)
MCV: 93.9 fL (ref 80.0–100.0)
Monocytes Absolute: 0.7 10*3/uL (ref 0.1–1.0)
Monocytes Relative: 9 %
Neutro Abs: 5.5 10*3/uL (ref 1.7–7.7)
Neutrophils Relative %: 72 %
Platelet Count: 241 10*3/uL (ref 150–400)
RBC: 4.75 MIL/uL (ref 3.87–5.11)
RDW: 17.8 % — ABNORMAL HIGH (ref 11.5–15.5)
WBC Count: 7.8 10*3/uL (ref 4.0–10.5)
nRBC: 0 % (ref 0.0–0.2)

## 2021-02-06 MED ORDER — ANASTROZOLE 1 MG PO TABS: 1.0000 mg | ORAL_TABLET | Freq: Every day | ORAL | 1 refills | Status: AC

## 2021-02-07 ENCOUNTER — Other Ambulatory Visit: Payer: Self-pay | Admitting: Pulmonary Disease

## 2021-02-07 ENCOUNTER — Other Ambulatory Visit: Payer: Self-pay | Admitting: Internal Medicine

## 2021-02-07 DIAGNOSIS — J418 Mixed simple and mucopurulent chronic bronchitis: Secondary | ICD-10-CM

## 2021-02-07 LAB — CANCER ANTIGEN 27.29: CA 27.29: 26.1 U/mL (ref 0.0–38.6)

## 2021-02-10 ENCOUNTER — Telehealth: Payer: Self-pay | Admitting: Pulmonary Disease

## 2021-02-10 DIAGNOSIS — J418 Mixed simple and mucopurulent chronic bronchitis: Secondary | ICD-10-CM

## 2021-02-10 MED ORDER — BUDESONIDE 0.5 MG/2ML IN SUSP: 0.5000 mg | Freq: Two times a day (BID) | RESPIRATORY_TRACT | 5 refills | Status: AC

## 2021-02-10 NOTE — Telephone Encounter (Signed)
Spoke with the pt  She requested rf on pulmicort neb sol  Rx was sent  Nothing further needed

## 2021-02-13 ENCOUNTER — Other Ambulatory Visit: Payer: Self-pay | Admitting: Pulmonary Disease

## 2021-02-14 NOTE — Telephone Encounter (Signed)
Dr. Elsworth Soho, please advise if you are okay with [place] refilling med or if this needs to be deferred to PCP.

## 2021-02-15 NOTE — Telephone Encounter (Signed)
Routing this to Dr. Stanford Breed. Please see med refill request as Dr. Elsworth Soho is wanting cards to handle refill. Thank you.

## 2021-02-23 ENCOUNTER — Encounter (HOSPITAL_BASED_OUTPATIENT_CLINIC_OR_DEPARTMENT_OTHER): Payer: Medicare Other | Attending: Internal Medicine | Admitting: Internal Medicine

## 2021-02-23 ENCOUNTER — Other Ambulatory Visit: Payer: Self-pay

## 2021-02-23 DIAGNOSIS — L97518 Non-pressure chronic ulcer of other part of right foot with other specified severity: Secondary | ICD-10-CM | POA: Diagnosis not present

## 2021-02-23 DIAGNOSIS — I70243 Atherosclerosis of native arteries of left leg with ulceration of ankle: Secondary | ICD-10-CM | POA: Insufficient documentation

## 2021-02-23 DIAGNOSIS — L97322 Non-pressure chronic ulcer of left ankle with fat layer exposed: Secondary | ICD-10-CM | POA: Diagnosis not present

## 2021-02-23 DIAGNOSIS — I110 Hypertensive heart disease with heart failure: Secondary | ICD-10-CM | POA: Diagnosis not present

## 2021-02-23 DIAGNOSIS — I509 Heart failure, unspecified: Secondary | ICD-10-CM | POA: Diagnosis not present

## 2021-02-23 DIAGNOSIS — I70235 Atherosclerosis of native arteries of right leg with ulceration of other part of foot: Secondary | ICD-10-CM | POA: Insufficient documentation

## 2021-02-23 DIAGNOSIS — L97328 Non-pressure chronic ulcer of left ankle with other specified severity: Secondary | ICD-10-CM | POA: Diagnosis not present

## 2021-02-23 DIAGNOSIS — L97512 Non-pressure chronic ulcer of other part of right foot with fat layer exposed: Secondary | ICD-10-CM | POA: Diagnosis not present

## 2021-02-23 NOTE — Progress Notes (Signed)
[name], [name] ([id number]) Visit Report for 02/23/2021 Abuse/Suicide Risk Screen Details Patient Name: Date of Service: [name], [name] RA S. 02/23/2021 2:45 PM Medical Record Number: [id number] Patient Account Number: [id number] Date of Birth/Sex: Treating RN: 04-21-1935 (86 y.o. [name] Primary Care [name]: [name] Other Clinician: Referring [name]: Treating [name]/Extender: [name] in Treatment: 0 Abuse/Suicide Risk Screen Items Answer ABUSE RISK SCREEN: Has anyone close to you tried to hurt or harm you recentlyo No Do you feel uncomfortable with anyone in your familyo No Has anyone forced you do things that you didnt want to doo No Electronic Signature(s) Signed: 02/23/2021 5:27:52 PM By: [name] Entered By: [name] on 02/23/2021 15:18:47 -------------------------------------------------------------------------------- Activities of Daily Living Details Patient Name: Date of Service: [name], [name] RA S. 02/23/2021 2:45 PM Medical Record Number: [id number] Patient Account Number: [id number] Date of Birth/Sex: Treating RN: 01-19-1935 (86 y.o. [name] Primary Care [name]: [name] Other Clinician: Referring [name]: Treating [name] in Treatment: 0 Activities of Daily Living Items Answer Activities of Daily Living (Please select one for each item) Drive Automobile Not Able T Medications ake Need Assistance Use T elephone Completely Able Care for Appearance Need Assistance Use T oilet Need Assistance Bath / Shower Need Assistance Dress Self Need Assistance Feed Self Completely Able Walk Need Assistance Get In / Out Bed Need Assistance Housework Not Able Prepare Meals Need Assistance Handle Money Need Assistance Shop for Self Not Able Electronic Signature(s) Signed: 02/23/2021 5:27:52 PM By: [name] Entered By: [name] on 02/23/2021  15:19:35 -------------------------------------------------------------------------------- Education Screening Details Patient Name: Date of Service: [name] RA S. 02/23/2021 2:45 PM Medical Record Number: [id number] Patient Account Number: [id number] Date of Birth/Sex: Treating RN: 09-29-1934 (86 y.o. [name] Primary Care Myrlene Riera: [name] Other Clinician: Referring [name]: Treating [name]/Extender: [name] in Treatment: 0 Primary Learner Assessed: Patient Learning Preferences/Education Level/Primary Language Learning Preference: Explanation, Demonstration, Printed Material Highest Education Level: College or Above Preferred Language: English Cognitive Barrier Language Barrier: No Translator Needed: No Memory Deficit: No Emotional Barrier: No Cultural/Religious Beliefs Affecting Medical Care: No Physical Barrier Impaired Vision: Yes Glasses Impaired Hearing: No Decreased Hand dexterity: No Knowledge/Comprehension Knowledge Level: High Comprehension Level: High Ability to understand written instructions: High Ability to understand verbal instructions: High Motivation Anxiety Level: Calm Cooperation: Cooperative Education Importance: Acknowledges Need Interest in Health Problems: Asks Questions Perception: Coherent Willingness to Engage in Self-Management High Activities: Readiness to Engage in Self-Management High Activities: Electronic Signature(s) Signed: 02/23/2021 5:27:52 PM By: [name] Entered By: [name] on 02/23/2021 15:20:13 -------------------------------------------------------------------------------- Fall Risk Assessment Details Patient Name: Date of Service: [name] RBA RA S. 02/23/2021 2:45 PM Medical Record Number: [id number] Patient Account Number: [id number] Date of Birth/Sex: Treating RN: Oct 25, 1934 (85 y.o. [name] Primary Care [name]: [name] Other  Clinician: Referring [name]: Treating [name]/Extender: [name] in Treatment: 0 Fall Risk Assessment Items Have you had 2 or more falls in the last 12 monthso 0 No Have you had any fall that resulted in injury in the last 12 monthso 0 No FALLS RISK SCREEN History of falling - immediate or within 3 months 0 No Secondary diagnosis (Do you have 2 or more medical diagnoseso) 0 No Ambulatory aid None/bed rest/wheelchair/nurse 0 No Crutches/cane/walker 15 Yes Furniture 0 No Intravenous therapy Access/Saline/Heparin Lock 0 No Gait/Transferring Normal/ bed rest/ wheelchair 0 No Weak (short steps with  or without shuffle, stooped but able to lift head while walking, may seek 10 Yes support from furniture) Impaired (short steps with shuffle, may have difficulty arising from chair, head down, impaired 0 No balance) Mental Status Oriented to own ability 0 Yes Electronic Signature(s) Signed: 02/23/2021 5:27:52 PM By: [name] Entered By: [name] on 02/23/2021 15:20:43 -------------------------------------------------------------------------------- Foot Assessment Details Patient Name: Date of Service: [name] RA S. 02/23/2021 2:45 PM Medical Record Number: [id number] Patient Account Number: [id number] Date of Birth/Sex: Treating RN: Jul 08, 1935 (86 y.o. [name] Primary Care [name]: [name] Other Clinician: Referring [name]: Treating [name]/Extender: [name] in Treatment: 0 Foot Assessment Items Site Locations + = Sensation present, - = Sensation absent, C = Callus, U = Ulcer R = Redness, W = Warmth, M = Maceration, PU = Pre-ulcerative lesion F = Fissure, S = Swelling, D = Dryness Assessment Right: Left: Other Deformity: No No Prior Foot Ulcer: No No Prior Amputation: No No Charcot Joint: No No Ambulatory Status: Ambulatory With Help Assistance Device: Walker Gait: Steady Electronic  Signature(s) Signed: 02/23/2021 5:27:52 PM By: [name] Entered By: [name] on 02/23/2021 15:24:47 -------------------------------------------------------------------------------- Nutrition Risk Screening Details Patient Name: Date of Service: [name] RA S. 02/23/2021 2:45 PM Medical Record Number: [id number] Patient Account Number: [id number] Date of Birth/Sex: Treating RN: [birth year] (86 y.o. [name] Primary Care Cloa Bushong: [name] Other Clinician: Referring Elimelech Houseman: Treating Nyhla Mountjoy/Extender: [name] in Treatment: 0 Height (in): Weight (lbs): Body Mass Index (BMI): Nutrition Risk Screening Items Score Screening NUTRITION RISK SCREEN: I have an illness or condition that made me change the kind and/or amount of food I eat 2 Yes I eat fewer than two meals per day 0 No I eat few fruits and vegetables, or milk products 0 No I have three or more drinks of beer, liquor or wine almost every day 0 No I have tooth or mouth problems that make it hard for me to eat 0 No I don't always have enough money to buy the food I need 0 No I eat alone most of the time 0 No I take three or more different prescribed or over-the-counter drugs a day 1 Yes Without wanting to, I have lost or gained 10 pounds in the last six months 2 Yes I am not always physically able to shop, cook and/or feed myself 0 No Nutrition Protocols Good Risk Protocol Moderate Risk Protocol 0 Provide education on nutrition High Risk Proctocol Risk Level: Moderate Risk Score: 5 Electronic Signature(s) Signed: 02/23/2021 5:27:52 PM By: [name] Entered By: [name] on 02/23/2021 15:21:16

## 2021-02-23 NOTE — Progress Notes (Addendum)
DONYEA, GAFFORD ([id number]) Visit Report for 02/23/2021 Allergy List Details Patient Name: Date of Service: JANESE, RADABAUGH [facility] RA S. 02/23/2021 2:45 PM Medical Record Number: [id number] Patient Account Number: [id number] Date of Birth/Sex: Treating RN: 1935-03-26 (86 y.o. Female) Lorrin Jackson Primary Care Provider: [name] Other Clinician: Referring Provider: Treating Provider/Extender: Domingo Sep in Treatment: 0 Allergies Active Allergies heparin morphine Allergy Notes Electronic Signature(s) Signed: 02/23/2021 5:27:52 PM By: Lorrin Jackson Entered By: Lorrin Jackson on 02/23/2021 15:03:04 -------------------------------------------------------------------------------- Arrival Information Details Patient Name: Date of Service: [name] RA S. 02/23/2021 2:45 PM Medical Record Number: [id number] Patient Account Number: [id number] Date of Birth/Sex: Treating RN: [birth year] (86 y.o. Female) Lorrin Jackson Primary Care Provider: [name] Other Clinician: Referring Provider: Treating Provider/Extender: Domingo Sep in Treatment: 0 Visit Information Patient Arrived: Wheel Chair Arrival Time: 14:56 Accompanied By: daughter Transfer Assistance: Manual Patient Identification Verified: Yes Secondary Verification Process Completed: Yes Patient Requires Transmission-Based Precautions: No Patient Has Alerts: No Electronic Signature(s) Signed: 02/23/2021 5:27:52 PM By: Lorrin Jackson Entered By: Lorrin Jackson on 02/23/2021 15:02:17 -------------------------------------------------------------------------------- Clinic Level of Care Assessment Details Patient Name: Date of Service: ANNAMARIE, YAMAGUCHI RA S. 02/23/2021 2:45 PM Medical Record Number: [id number] Patient Account Number: [id number] Date of Birth/Sex: Treating RN: 1934/09/25 (86 y.o. Female) Deon Pilling Primary Care Provider: [name] Other Clinician: Referring  Provider: Treating Provider/Extender: Domingo Sep in Treatment: 0 Clinic Level of Care Assessment Items TOOL 1 Quantity Score X- 1 0 Use when EandM and Procedure is performed on INITIAL visit ASSESSMENTS - Nursing Assessment / Reassessment X- 1 20 General Physical Exam (combine w/ comprehensive assessment (listed just below) when performed on new pt. evals) X- 1 25 Comprehensive Assessment (HX, ROS, Risk Assessments, Wounds Hx, etc.) ASSESSMENTS - Wound and Skin Assessment / Reassessment X- 1 10 Dermatologic / Skin Assessment (not related to wound area) ASSESSMENTS - Ostomy and/or Continence Assessment and Care [] - 0 Incontinence Assessment and Management [] - 0 Ostomy Care Assessment and Management (repouching, etc.) PROCESS - Coordination of Care [] - 0 Simple Patient / Family Education for ongoing care X- 1 20 Complex (extensive) Patient / Family Education for ongoing care X- 1 10 Staff obtains Programmer, systems, Records, T Results / Process Orders est [] - 0 Staff telephones HHA, Nursing Homes / Clarify orders / etc [] - 0 Routine Transfer to another Facility (non-emergent condition) [] - 0 Routine Hospital Admission (non-emergent condition) X- 1 15 New Admissions / Biomedical engineer / Ordering NPWT Apligraf, etc. , [] - 0 Emergency Hospital Admission (emergent condition) PROCESS - Special Needs [] - 0 Pediatric / Minor Patient Management [] - 0 Isolation Patient Management [] - 0 Hearing / Language / Visual special needs [] - 0 Assessment of Community assistance (transportation, D/C planning, etc.) [] - 0 Additional assistance / Altered mentation [] - 0 Support Surface(s) Assessment (bed, cushion, seat, etc.) INTERVENTIONS - Miscellaneous [] - 0 External ear exam [] - 0 Patient Transfer (multiple staff / Civil Service fast streamer / Similar devices) [] - 0 Simple Staple / Suture removal (25 or less) [] - 0 Complex Staple / Suture removal (26 or  more) [] - 0 Hypo/Hyperglycemic Management (do not check if billed separately) X- 1 15 Ankle / Brachial Index (ABI) - do not check if billed separately Has the patient been seen at the hospital within the last three years: Yes Total Score: 115 Level Of Care: New/Established - Level 3 Electronic  Signature(s) Signed: 02/23/2021 5:46:50 PM By: Deon Pilling Entered By: Deon Pilling on 02/23/2021 17:22:12 -------------------------------------------------------------------------------- Lower Extremity Assessment Details Patient Name: Date of Service: [name] RA S. 02/23/2021 2:45 PM Medical Record Number: [id number] Patient Account Number: [id number] Date of Birth/Sex: Treating RN: 24-May-1935 (86 y.o. Female) Lorrin Jackson Primary Care Shemeca Lukasik: [name] Other Clinician: Referring Rilen Shukla: Treating Armandina Iman/Extender: Domingo Sep in Treatment: 0 Edema Assessment Assessed: Shirlyn Goltz: Yes] Patrice Paradise: Yes] Edema: [Left: Yes] [Right: Yes] Calf Left: Right: Point of Measurement: 30 cm From Medial Instep 32.5 cm 32.4 cm Ankle Left: Right: Point of Measurement: 9 cm From Medial Instep 20 cm 19.5 cm Knee To Floor Left: Right: From Medial Instep 39 cm 39 cm Vascular Assessment Pulses: Dorsalis Pedis Palpable: [Left:No] [Right:No] Doppler Audible: [Left:Yes] [Right:Yes] Blood Pressure: Brachial: [Left:94] [Right:94] Ankle: [Left:Dorsalis Pedis: 98 1.04] [Right:Dorsalis Pedis: 100 1.06] Electronic Signature(s) Signed: 02/23/2021 5:27:52 PM By: Lorrin Jackson Entered By: Lorrin Jackson on 02/23/2021 15:33:41 -------------------------------------------------------------------------------- Multi Wound Chart Details Patient Name: Date of Service: [name] RA S. 02/23/2021 2:45 PM Medical Record Number: [id number] Patient Account Number: [id number] Date of Birth/Sex: Treating RN: [birth year] (86 y.o. Female) Deon Pilling Primary Care Katrell Milhorn:  [name] Other Clinician: Referring Bryon Parker: Treating Lynnlee Revels/Extender: Domingo Sep in Treatment: 0 Vital Signs Height(in): Pulse(bpm): 1 Weight(lbs): Blood Pressure(mmHg): 21/60 Body Mass Index(BMI): Temperature(F): 97.7 Respiratory Rate(breaths/min): 38 Photos: [1:No Photos Left, Medial Ankle] [2:No Photos Right, Medial Foot] [3:No Photos Right T Fifth oe] Wound Location: [1:Other Lesion] [2:Other Lesion] [3:Other Lesion] Wounding Event: [1:Inflammatory] [2:Arterial Insufficiency Ulcer] [3:Arterial Insufficiency Ulcer] Primary Etiology: [1:Glaucoma, Chronic Obstructive] [2:Glaucoma, Chronic Obstructive] [3:Glaucoma, Chronic Obstructive] Comorbid History: [1:Pulmonary Disease (COPD), Arrhythmia, Congestive Heart Failure, Arrhythmia, Congestive Heart Failure, Arrhythmia, Congestive Heart Failure, Hypertension, Osteoarthritis, ReceivedHypertension, Osteoarthritis, ReceivedHypertension,  Osteoarthritis, Received Chemotherapy 12/12/2020] [2:Pulmonary Disease (COPD), Chemotherapy 02/06/2021] [3:Pulmonary Disease (COPD), Chemotherapy 02/06/2021] Date Acquired: [1:0] [2:0] [3:0] Weeks of Treatment: [1:Open] [2:Open] [3:Open] Wound Status: [1:0.7x1.2x0.2] [2:0.3x0.4x0.2] [3:0.1x0.2x0.1] Measurements L x W x D (cm) [1:0.66] [2:0.094] [3:0.016] A (cm) : rea [1:0.132] [2:0.019] [3:0.002] Volume (cm) : [1:Full Thickness Without Exposed] [2:Full Thickness Without Exposed] [3:Full Thickness Without Exposed] Classification: [1:Support Structures Medium] [2:Support Structures Medium] [3:Support Structures Medium] Exudate A mount: [1:Purulent] [2:Serosanguineous] [3:Serosanguineous] Exudate Type: [1:yellow, brown, green] [2:red, brown] [3:red, brown] Exudate Color: [1:Distinct, outline attached] [2:Distinct, outline attached] [3:Distinct, outline attached] Wound Margin: [1:Small (1-33%)] [2:Medium (34-66%)] [3:Large (67-100%)] Granulation A mount: [1:Red] [2:Pale] [3:Red,  Pink] Granulation Quality: [1:Large (67-100%)] [2:Medium (34-66%)] [3:None Present (0%)] Necrotic A mount: [1:Fat Layer (Subcutaneous Tissue): Yes Fat Layer (Subcutaneous Tissue): Yes Fat Layer (Subcutaneous Tissue): Yes] Exposed Structures: [1:Fascia: No Tendon: No Muscle: No Joint: No Bone: No None] [2:Fascia: No Tendon: No Muscle: No Joint: No Bone: No None] [3:Fascia: No Tendon: No Muscle: No Joint: No Bone: No None] Epithelialization: [1:Debridement - Excisional] [2:Chemical/Enzymatic/Mechanical] [3:Debridement - Excisional] Debridement: Pre-procedure Verification/Time Out 14:20 [2:N/A] [3:14:20] Taken: [1:Other] [2:N/A] [3:Other] Pain Control: [1:Subcutaneous, Slough] [2:N/A] [3:Subcutaneous, Slough] Tissue Debrided: [1:Skin/Subcutaneous Tissue] [2:N/A] [3:Skin/Subcutaneous Tissue] Level: [1:1] [2:N/A] [3:0.02] Debridement A (sq cm): [1:rea Curette] [2:N/A] [3:Curette] Instrument: [1:Minimum] [2:None] [3:Minimum] Bleeding: [1:Pressure] [2:N/A] [3:Pressure] Hemostasis A chieved: [1:3] [2:N/A] [3:3] Procedural Pain: [1:4] [2:N/A] [3:4] Post Procedural Pain: [1:Procedure was tolerated well] [2:Procedure was tolerated well] [3:Procedure was tolerated well] Debridement Treatment Response: [1:0.7x1.2x0.2] [2:0.3x0.4x0.2] [3:0.1x0.2x0.1] Post Debridement Measurements L x W x D (cm) [1:0.132] [2:0.019] [3:0.002] Post Debridement Volume: (cm) [1:Debridement] [2:Debridement] [3:Debridement] Treatment Notes Electronic Signature(s) Signed: 02/23/2021 5:20:02 PM By: Linton Ham  MD Signed: 02/23/2021 5:46:50 PM By: Deon Pilling Entered By: Linton Ham on 02/23/2021 16:52:09 -------------------------------------------------------------------------------- Multi-Disciplinary Care Plan Details Patient Name: Date of Service: [name] S. 02/23/2021 2:45 PM Medical Record Number: [id number] Patient Account Number: [id number] Date of Birth/Sex: Treating RN: Jan 18, 1935 (86 y.o.  Female) Deon Pilling Primary Care Kelis Plasse: [name] Other Clinician: Referring Shiya Fogelman: Treating Oneita Allmon/Extender: Domingo Sep in Treatment: 0 Active Inactive Orientation to the Wound Care Program Nursing Diagnoses: Knowledge deficit related to the wound healing center program Goals: Patient/caregiver will verbalize understanding of the [place] Date Initiated: 02/23/2021 Target Resolution Date: 03/09/2021 Goal Status: Active Interventions: Provide education on orientation to the wound center Notes: Pain, Acute or Chronic Nursing Diagnoses: Pain, acute or chronic: actual or potential Potential alteration in comfort, pain Goals: Patient will verbalize adequate pain control and receive pain control interventions during procedures as needed Date Initiated: 02/23/2021 Target Resolution Date: 03/09/2021 Goal Status: Active Patient/caregiver will verbalize adequate pain control between visits Date Initiated: 02/23/2021 Target Resolution Date: 03/08/2021 Goal Status: Active Patient/caregiver will verbalize comfort level met Date Initiated: 02/23/2021 Target Resolution Date: 03/08/2021 Goal Status: Active Interventions: Encourage patient to take pain medications as prescribed Provide education on pain management Reposition patient for comfort Treatment Activities: Administer pain control measures as ordered : 02/23/2021 Notes: Wound/Skin Impairment Nursing Diagnoses: Knowledge deficit related to ulceration/compromised skin integrity Goals: Patient/caregiver will verbalize understanding of skin care regimen Date Initiated: 02/23/2021 Target Resolution Date: 03/09/2021 Goal Status: Active Interventions: Assess patient/caregiver ability to perform ulcer/skin care regimen upon admission and as needed Assess ulceration(s) every visit Provide education on ulcer and skin care Treatment Activities: Skin care regimen initiated :  02/23/2021 Topical wound management initiated : 02/23/2021 Notes: Electronic Signature(s) Signed: 02/23/2021 5:46:50 PM By: Deon Pilling Entered By: Deon Pilling on 02/23/2021 16:09:42 -------------------------------------------------------------------------------- Pain Assessment Details Patient Name: Date of Service: [name] RA S. 02/23/2021 2:45 PM Medical Record Number: [id number] Patient Account Number: [id number] Date of Birth/Sex: Treating RN: 03-Jun-1935 (86 y.o. Female) Lorrin Jackson Primary Care Mellonie Guess: Other Clinician: [name] Referring Sieanna Vanstone: Treating Ronan Dion/Extender: Domingo Sep in Treatment: 0 Active Problems Location of Pain Severity and Description of Pain Patient Has Paino Yes Site Locations Pain Location: Generalized Pain, Pain in Ulcers With Dressing Change: Yes Duration of the Pain. Constant / Intermittento Intermittent Rate the pain. Current Pain Level: 9 Character of Pain Describe the Pain: Burning, Throbbing Pain Management and Medication Current Pain Management: Medication: Yes Cold Application: No Rest: Yes Massage: No Activity: No T.E.N.S.: No Heat Application: No Leg drop or elevation: No Is the Current Pain Management Adequate: Inadequate How does your wound impact your activities of daily livingo Sleep: Yes Bathing: No Appetite: No Relationship With Others: No Bladder Continence: No Emotions: No Bowel Continence: No Work: No Toileting: No Drive: No Dressing: No Hobbies: No Electronic Signature(s) Signed: 02/23/2021 5:27:52 PM By: Lorrin Jackson Entered By: Lorrin Jackson on 02/23/2021 15:49:17 -------------------------------------------------------------------------------- Patient/Caregiver Education Details Patient Name: Date of Service: Eliott Nine 6/23/2022andnbsp2:45 PM Medical Record Number: [id number] Patient Account Number: [id number] Date of Birth/Gender: Treating  RN: Nov 06, 1934 (86 y.o. Female) Deon Pilling Primary Care Physician: [name] Other Clinician: Referring Physician: Treating Physician/Extender: Domingo Sep in Treatment: 0 Education Assessment Education Provided To: Patient Education Topics Provided Welcome T [facility] [place]: o Handouts: Welcome T [facility] [place] o Methods: Explain/Verbal, Printed Responses: Reinforcements needed Electronic Signature(s) Signed: 02/23/2021 5:46:50  PM By: Deon Pilling Entered By: Deon Pilling on 02/23/2021 16:10:00 -------------------------------------------------------------------------------- Wound Assessment Details Patient Name: Date of Service: SHONIKA, KOLASINSKI [facility] RA S. 02/23/2021 2:45 PM Medical Record Number: [id number] Patient Account Number: [id number] Date of Birth/Sex: Treating RN: 1935/03/27 (86 y.o. Female) Lorrin Jackson Primary Care Ansley Stanwood: [name] Other Clinician: Referring Kallen Mccrystal: Treating Reylene Stauder/Extender: Domingo Sep in Treatment: 0 Wound Status Wound Number: 1 Primary Arterial Insufficiency Ulcer Etiology: Wound Location: Left, Medial Ankle Wound Open Wounding Event: Other Lesion Status: Date Acquired: 12/12/2020 Comorbid Glaucoma, Chronic Obstructive Pulmonary Disease (COPD), Weeks Of Treatment: 0 History: Arrhythmia, Congestive Heart Failure, Hypertension, Osteoarthritis, Clustered Wound: No Received Chemotherapy Photos Wound Measurements Length: (cm) 0.7 Width: (cm) 1.2 Depth: (cm) 0.2 Area: (cm) 0.66 Volume: (cm) 0.132 % Reduction in Area: 0% % Reduction in Volume: 0% Epithelialization: None Tunneling: No Undermining: No Wound Description Classification: Full Thickness Without Exposed Support Structures Wound Margin: Distinct, outline attached Exudate Amount: Medium Exudate Type: Purulent Exudate Color: yellow, brown, green Foul Odor After Cleansing: No Slough/Fibrino Yes Wound  Bed Granulation Amount: Small (1-33%) Exposed Structure Granulation Quality: Red Fascia Exposed: No Necrotic Amount: Large (67-100%) Fat Layer (Subcutaneous Tissue) Exposed: Yes Necrotic Quality: Adherent Slough Tendon Exposed: No Muscle Exposed: No Joint Exposed: No Bone Exposed: No Electronic Signature(s) Signed: 02/27/2021 4:13:19 PM By: Sandre Kitty Signed: 02/27/2021 5:08:20 PM By: Lorrin Jackson Previous Signature: 02/23/2021 5:27:52 PM Version By: Lorrin Jackson Entered By: Sandre Kitty on 02/24/2021 16:40:04 -------------------------------------------------------------------------------- Wound Assessment Details Patient Name: Date of Service: [name] RA S. 02/23/2021 2:45 PM Medical Record Number: [id number] Patient Account Number: [id number] Date of Birth/Sex: Treating RN: [birth year] (86 y.o. Female) Lorrin Jackson Primary Care Marcellus Pulliam: [name] Other Clinician: Referring Verdene Creson: Treating Nyrah Demos/Extender: Domingo Sep in Treatment: 0 Wound Status Wound Number: 2 Primary Arterial Insufficiency Ulcer Etiology: Wound Location: Right, Medial Foot Wound Open Wounding Event: Other Lesion Status: Date Acquired: 02/06/2021 Comorbid Glaucoma, Chronic Obstructive Pulmonary Disease (COPD), Weeks Of Treatment: 0 History: Arrhythmia, Congestive Heart Failure, Hypertension, Osteoarthritis, Clustered Wound: No Received Chemotherapy Photos Wound Measurements Length: (cm) 0.3 Width: (cm) 0.4 Depth: (cm) 0.2 Area: (cm) 0.094 Volume: (cm) 0.019 % Reduction in Area: 0% % Reduction in Volume: 0% Epithelialization: None Tunneling: No Undermining: No Wound Description Classification: Full Thickness Without Exposed Support Structures Wound Margin: Distinct, outline attached Exudate Amount: Medium Exudate Type: Serosanguineous Exudate Color: red, brown Foul Odor After Cleansing: No Slough/Fibrino Yes Wound Bed Granulation Amount:  Medium (34-66%) Exposed Structure Granulation Quality: Pale Fascia Exposed: No Necrotic Amount: Medium (34-66%) Fat Layer (Subcutaneous Tissue) Exposed: Yes Necrotic Quality: Adherent Slough Tendon Exposed: No Muscle Exposed: No Joint Exposed: No Bone Exposed: No Electronic Signature(s) Signed: 02/27/2021 4:13:19 PM By: Sandre Kitty Signed: 02/27/2021 5:08:20 PM By: Lorrin Jackson Previous Signature: 02/23/2021 5:27:52 PM Version By: Lorrin Jackson Previous Signature: 02/23/2021 5:27:52 PM Version By: Lorrin Jackson Entered By: Sandre Kitty on 02/24/2021 16:40:38 -------------------------------------------------------------------------------- Wound Assessment Details Patient Name: Date of Service: [name] RA S. 02/23/2021 2:45 PM Medical Record Number: [id number] Patient Account Number: [id number] Date of Birth/Sex: Treating RN: [birth year] (86 y.o. Female) Lorrin Jackson Primary Care Sherion Dooly: [name] Other Clinician: Referring Labarron Durnin: Treating Mckaila Duffus/Extender: Domingo Sep in Treatment: 0 Wound Status Wound Number: 3 Primary Arterial Insufficiency Ulcer Etiology: Wound Location: Right T Fifth oe Wound Open Wounding Event: Other Lesion Status: Date Acquired: 02/06/2021 Comorbid Glaucoma, Chronic Obstructive Pulmonary Disease (COPD), Weeks Of Treatment: 0 History: Arrhythmia, Congestive Heart Failure,  Hypertension, Osteoarthritis, Clustered Wound: No Received Chemotherapy Photos Wound Measurements Length: (cm) 0.1 Width: (cm) 0.2 Depth: (cm) 0.1 Area: (cm) 0.016 Volume: (cm) 0.002 % Reduction in Area: 0% % Reduction in Volume: 0% Epithelialization: None Tunneling: No Undermining: No Wound Description Classification: Full Thickness Without Exposed Support Structures Wound Margin: Distinct, outline attached Exudate Amount: Medium Exudate Type: Serosanguineous Exudate Color: red, brown Foul Odor After Cleansing:  No Slough/Fibrino No Wound Bed Granulation Amount: Large (67-100%) Exposed Structure Granulation Quality: Red, Pink Fascia Exposed: No Necrotic Amount: None Present (0%) Fat Layer (Subcutaneous Tissue) Exposed: Yes Tendon Exposed: No Muscle Exposed: No Joint Exposed: No Bone Exposed: No Electronic Signature(s) Signed: 02/27/2021 4:13:19 PM By: Sandre Kitty Signed: 02/27/2021 5:08:20 PM By: Lorrin Jackson Previous Signature: 02/23/2021 5:27:52 PM Version By: Lorrin Jackson Entered By: Sandre Kitty on 02/24/2021 16:40:23 -------------------------------------------------------------------------------- Vitals Details Patient Name: Date of Service: [name] S. 02/23/2021 2:45 PM Medical Record Number: [id number] Patient Account Number: [id number] Date of Birth/Sex: Treating RN: Apr 20, 1935 (86 y.o. Female) Lorrin Jackson Primary Care Bannie Lobban: [name] Other Clinician: Referring Jacelyn Cuen: Treating Tyreesha Maharaj/Extender: Domingo Sep in Treatment: 0 Vital Signs Time Taken: 15:06 Temperature (F): 97.7 Pulse (bpm): 86 Respiratory Rate (breaths/min): 22 Blood Pressure (mmHg): 94/60 Reference Range: 80 - 120 mg / dl Electronic Signature(s) Signed: 02/23/2021 5:27:52 PM By: Lorrin Jackson Entered By: Lorrin Jackson on 02/23/2021 15:06:30

## 2021-02-24 NOTE — Progress Notes (Signed)
[name], [name] ([id number]) Visit Report for 02/23/2021 Chief Complaint Document Details Patient Name: Date of Service: [name], [name] [facility] RA S. 02/23/2021 2:45 PM Medical Record Number: [id number] Patient Account Number: [id number] Date of Birth/Sex: Treating RN: October 30, 1934 (86 y.o. Female) Deon Pilling Primary Care Provider: [name] Other Clinician: Referring Provider: Treating Provider/Extender: Domingo Sep in Treatment: 0 Information Obtained from: Patient Chief Complaint 02/23/2021; patient is here for review predominantly of an area on her left medial malleolus Electronic Signature(s) Signed: 02/23/2021 5:20:02 PM By: Linton Ham MD Entered By: Linton Ham on 02/23/2021 16:54:12 -------------------------------------------------------------------------------- Debridement Details Patient Name: Date of Service: [name] RA S. 02/23/2021 2:45 PM Medical Record Number: [id number] Patient Account Number: [id number] Date of Birth/Sex: Treating RN: [birth year] (86 y.o. Female) Deon Pilling Primary Care Provider: [name] Other Clinician: Referring Provider: Treating Provider/Extender: Domingo Sep in Treatment: 0 Debridement Performed for Assessment: Wound #2 Right,Medial Foot Performed By: Clinician Baruch Gouty, RN Debridement Type: Chemical/Enzymatic/Mechanical Agent Used: Santyl Severity of Tissue Pre Debridement: Fat layer exposed Level of Consciousness (Pre-procedure): Awake and Alert Pre-procedure Verification/Time Out No Taken: Bleeding: None Response to Treatment: Procedure was tolerated well Level of Consciousness (Post- Awake and Alert procedure): Post Debridement Measurements of Total Wound Length: (cm) 0.3 Width: (cm) 0.4 Depth: (cm) 0.2 Volume: (cm) 0.019 Character of Wound/Ulcer Post Debridement: Requires Further Debridement Severity of Tissue Post Debridement: Fat layer exposed Post  Procedure Diagnosis Same as Pre-procedure Electronic Signature(s) Signed: 02/23/2021 5:20:02 PM By: Linton Ham MD Signed: 02/23/2021 5:46:50 PM By: Deon Pilling Entered By: Deon Pilling on 02/23/2021 16:35:14 -------------------------------------------------------------------------------- Debridement Details Patient Name: Date of Service: [name] RA S. 02/23/2021 2:45 PM Medical Record Number: [id number] Patient Account Number: [id number] Date of Birth/Sex: Treating RN: 04-Sep-1934 (86 y.o. Female) Rolin Barry, [place] Primary Care Provider: [name] Other Clinician: Referring Provider: Treating Provider/Extender: Domingo Sep in Treatment: 0 Debridement Performed for Assessment: Wound #3 Right T Fifth oe Performed By: Physician Ricard Dillon., MD Debridement Type: Debridement Severity of Tissue Pre Debridement: Fat layer exposed Level of Consciousness (Pre-procedure): Awake and Alert Pre-procedure Verification/Time Out Yes - 14:20 Taken: Start Time: 14:21 Pain Control: Other : benzocaine 20% T Area Debrided (L x W): otal 0.1 (cm) x 0.2 (cm) = 0.02 (cm) Tissue and other material debrided: Viable, Non-Viable, Slough, Subcutaneous, Skin: Dermis , Skin: Epidermis, Fibrin/Exudate, Slough Level: Skin/Subcutaneous Tissue Debridement Description: Excisional Instrument: Curette Bleeding: Minimum Hemostasis Achieved: Pressure End Time: 16:27 Procedural Pain: 3 Post Procedural Pain: 4 Response to Treatment: Procedure was tolerated well Level of Consciousness (Post- Awake and Alert procedure): Post Debridement Measurements of Total Wound Length: (cm) 0.1 Width: (cm) 0.2 Depth: (cm) 0.1 Volume: (cm) 0.002 Character of Wound/Ulcer Post Debridement: Requires Further Debridement Severity of Tissue Post Debridement: Fat layer exposed Post Procedure Diagnosis Same as Pre-procedure Electronic Signature(s) Signed: 02/23/2021 5:20:02 PM By: Linton Ham MD Signed: 02/23/2021 5:46:50 PM By: Deon Pilling Entered By: Deon Pilling on 02/23/2021 16:35:33 -------------------------------------------------------------------------------- Debridement Details Patient Name: Date of Service: [name] RA S. 02/23/2021 2:45 PM Medical Record Number: [id number] Patient Account Number: [id number] Date of Birth/Sex: Treating RN: May 22, 1935 (86 y.o. Female) Deon Pilling Primary Care Provider: [name] Other Clinician: Referring Provider: Treating Provider/Extender: Domingo Sep in Treatment: 0 Debridement Performed for Assessment: Wound #1 Left,Medial Ankle Performed By: Physician Ricard Dillon., MD Debridement Type: Debridement Severity of Tissue Pre Debridement: Fat layer exposed Level of Consciousness (Pre-procedure):  Awake and Alert Pre-procedure Verification/Time Out Yes - 14:20 Taken: Start Time: 14:21 Pain Control: Other : benzocaine 20% T Area Debrided (L x W): otal 1 (cm) x 1 (cm) = 1 (cm) Tissue and other material debrided: Viable, Non-Viable, Slough, Subcutaneous, Skin: Dermis , Skin: Epidermis, Fibrin/Exudate, Slough Level: Skin/Subcutaneous Tissue Debridement Description: Excisional Instrument: Curette Bleeding: Minimum Hemostasis Achieved: Pressure End Time: 16:27 Procedural Pain: 3 Post Procedural Pain: 4 Response to Treatment: Procedure was tolerated well Level of Consciousness (Post- Awake and Alert procedure): Post Debridement Measurements of Total Wound Length: (cm) 0.7 Width: (cm) 1.2 Depth: (cm) 0.2 Volume: (cm) 0.132 Character of Wound/Ulcer Post Debridement: Requires Further Debridement Severity of Tissue Post Debridement: Fat layer exposed Post Procedure Diagnosis Same as Pre-procedure Electronic Signature(s) Signed: 02/23/2021 5:20:02 PM By: Linton Ham MD Signed: 02/23/2021 5:46:50 PM By: Deon Pilling Entered By: Linton Ham on 02/23/2021  16:53:25 -------------------------------------------------------------------------------- HPI Details Patient Name: Date of Service: [name] RBA RA S. 02/23/2021 2:45 PM Medical Record Number: [id number] Patient Account Number: [id number] Date of Birth/Sex: Treating RN: 1935-06-22 (86 y.o. Female) Deon Pilling Primary Care Provider: [name] Other Clinician: Referring Provider: Treating Provider/Extender: Domingo Sep in Treatment: 0 History of Present Illness HPI Description: ADMISSION 02/23/2021 Mrs. Lanting is an unfortunate 86 year old lady with multiple medical issues. She is here accompanied by her daughter. She tells me that she was in the hospital for heart failure in April. She had a painful area over the left medial malleolus and a nurse put some form of foam cover on this. When she took the foam cover off when she got home after she was discharged she had a open wound which is very painful. She went to see Dr. Ronnald Ramp of dermatology who put some form of waterproof Band-Aid on this. This has not helped. She was seen by her oncologist who referred her here [Dr. Feng]. The patient states that she is in a lot of pain because of this it hurts at night. During our review by our intake nurse it was noted that she also had a small punched-out area on the right medial first MTP and an area on her dorsal right fifth toe. She does not give a claudication history in fact she says that her legs actually feel better when she is walking. Past medical history she has multiple serious past medical issues including metastatic breast cancer on an aromatase inhibitor. 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 I reviewed her echocardiogram from 12/09/2020 she had normal ejection fraction but also noted to have severe apical hypertrophy/ASH. Her right ventricular systolic function was severely reduced with severely elevated pulmonary artery  pressures. He also has a history of bladder cancer she has had double mastectomy she is on chronic oxygen. For the last year she has had very significant discoloration of her forefeet especially when they are dependent. This is clearly not a lifelong phenomenon ABIs were done in our clinic and surprisingly were quite good with an ABI of 1.06 on the right and 1.04 on the left Electronic Signature(s) Signed: 02/23/2021 5:20:02 PM By: Linton Ham MD Signed: 02/23/2021 5:20:02 PM By: Linton Ham MD Entered By: Linton Ham on 02/23/2021 17:00:11 -------------------------------------------------------------------------------- Physical Exam Details Patient Name: Date of Service: [name] RA S. 02/23/2021 2:45 PM Medical Record Number: [id number] Patient Account Number: [id number] Date of Birth/Sex: Treating RN: [birth year] (86 y.o. Female) Deon Pilling Primary Care Provider: [name] Other Clinician: Referring Provider: Treating Provider/Extender: Dellia Nims  [place], Krista Blue Weeks in Treatment: 0 Constitutional Patient is hypotensive.. Pulse regular and within target range for patient.Marland Kitchen Respiratory effort pronounced and rate above normal.. Temperature is normal and within the target range for the patient.Marland Kitchen Appears somewhat dyspneic. Cardiovascular 3-6 systolic murmur at the lower left sternal border likely tricuspid regurgitation her JVP is elevated to the angle of the jaw. Some sacral edema. Pedal pulses are difficult to feel although I think I was able to feel dorsalis pedis pulses I could not feel her popliteal pulses. Femoral pulses were palpable. Marked edema in her lower extremities extending up into her thighs 2-3+. Pitting very tender but not particularly warm.. Notes Wound exam; The area she came in about on the left medial malleolus had a raised necrotic surface. I remove this with a #5 curette after discussion about whether she wanted to move forward with this. The  surface of this was not particularly viable after debridement but it Alese give the chance for Santyl to try and finish the job She has a small punched-out area over the right lateral malleolus and the right fifth toe. These were very tiny Electronic Signature(s) Signed: 02/23/2021 5:20:02 PM By: Linton Ham MD Entered By: Linton Ham on 02/23/2021 17:03:33 -------------------------------------------------------------------------------- Physician Orders Details Patient Name: Date of Service: [name] RA S. 02/23/2021 2:45 PM Medical Record Number: [id number] Patient Account Number: [id number] Date of Birth/Sex: Treating RN: 02-17-1935 (86 y.o. Female) Deon Pilling Primary Care Provider: [name] Other Clinician: Referring Provider: Treating Provider/Extender: Domingo Sep in Treatment: 0 Verbal / Phone Orders: No Diagnosis Coding Follow-up Appointments ppointment in 1 week. - Dr. Dellia Nims Return A Bathing/ Shower/ Hygiene May shower and wash wound with soap and water. - with dressing changes. Edema Control - Lymphedema / SCD / Other Elevate legs to the level of the heart or above for 30 minutes daily and/or when sitting, a frequency of: - 3-4 times a day throughout the day. Avoid standing for long periods of time. Other Edema Control Orders/Instructions: - do not sleep in recliner with legs down. Wound Treatment Wound #1 - Ankle Wound Laterality: Left, Medial Cleanser: Soap and Water 1 x Per Day/30 Days Discharge Instructions: May shower and wash wound with dial antibacterial soap and water prior to dressing change. Peri-Wound Care: Skin Prep (DME) (Generic) 1 x Per Day/30 Days Discharge Instructions: Use skin prep as directed Prim Dressing: Santyl Ointment 1 x Per Day/30 Days ary Discharge Instructions: Apply nickel thick amount to wound bed as instructed Secondary Dressing: Zetuvit Plus Silicone Border Dressing 4x4 (in/in) (DME) (Generic) 1 x  Per Day/30 Days Discharge Instructions: Apply silicone border over primary dressing as directed. Wound #2 - Foot Wound Laterality: Right, Medial Cleanser: Soap and Water 1 x Per Day/30 Days Discharge Instructions: May shower and wash wound with dial antibacterial soap and water prior to dressing change. Peri-Wound Care: Skin Prep (DME) (Generic) 1 x Per Day/30 Days Discharge Instructions: Use skin prep as directed Prim Dressing: Santyl Ointment 1 x Per Day/30 Days ary Discharge Instructions: Apply nickel thick amount to wound bed as instructed Secondary Dressing: Zetuvit Plus Silicone Border Dressing 4x4 (in/in) (DME) (Generic) 1 x Per Day/30 Days Discharge Instructions: Apply silicone border over primary dressing as directed. Wound #3 - T Fifth oe Wound Laterality: Right Cleanser: Soap and Water 1 x Per Day/30 Days Discharge Instructions: May shower and wash wound with dial antibacterial soap and water prior to dressing change. Services and Therapies rterial Studies- Bilateral -  refer to Dr. Gwenlyn Found office- arterial studies with ABIs and TBIs. Someone from Dr. Gwenlyn Found office will call you to set up A appointment time. Patient Medications llergies: heparin, morphine A Notifications Medication Indication Start End benzocaine DOSE topical 20 % aerosol - aerosol topical applied only in clinic for debridements. 02/23/2021 Santyl DOSE topical 250 unit/gram ointment - ointment topical to wound areas change daily Electronic Signature(s) Signed: 02/23/2021 5:08:19 PM By: Linton Ham MD Entered By: Linton Ham on 02/23/2021 17:08:18 Prescription 02/23/2021 -------------------------------------------------------------------------------- Odessa Fleming MD Patient Name: Provider: Jul 06, 1935 [id number] Date of Birth: NPI#: Female [id number] Sex: DEA #: [phone] [id number] Phone #: License #: [place] Patient Address: [address] [place], [place] [address] [place], [place] [address] [phone] Allergies heparin; morphine Provider's Orders rterial Studies- Bilateral - refer to Dr. Gwenlyn Found office- arterial studies with ABIs and TBIs. Someone from Dr. Gwenlyn Found office will call you to set up A appointment time. Hand Signature: Date(s): Prescription 02/23/2021 Odessa Fleming MD Patient Name: Provider: [birth year] [id number] Date of Birth: NPI#: Female [id number] Sex: DEA #: [phone] [id number] Phone #: License #: [place] Patient Address: [address] [place], [place] [address] [place], [place] [address] [phone] Allergies heparin; morphine Medication Medication: Route: Strength: Form: benzocaine 20 % topical aerosol topical 20 % aerosol Class: TOPICAL LOCAL ANESTHETICS Dose: Frequency / Time: Indication: aerosol topical applied only in clinic for debridements. Number of Refills: Number of Units: 0 Generic Substitution: Start Date: End Date: One Time Use: Substitution Permitted No Note to Pharmacy: Hand Signature: Date(s): Electronic Signature(s) Signed: 02/23/2021 5:20:02 PM By: Linton Ham MD Entered By: Linton Ham on 02/23/2021 17:08:19 -------------------------------------------------------------------------------- Problem List Details Patient Name: Date of Service: [name] RA S. 02/23/2021 2:45 PM Medical Record Number: [id number] Patient Account Number: [id number] Date of Birth/Sex: Treating RN: 09/12/1934 (86 y.o. 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 02/23/2021 2:45 PM Medical Record Number: [id number] Patient Account Number: [id number] Date of Birth/Sex: Treating RN: March 24, 1935 (86 y.o. Female) Deon Pilling Primary Care Provider: [name] Other Clinician: Referring Provider: Treating Provider/Extender: Domingo Sep in Treatment: 0 Subjective Chief Complaint Information obtained from Patient 02/23/2021; patient is here for review predominantly of an area on her left medial malleolus History of Present Illness (HPI) ADMISSION 02/23/2021 Mrs. Degrasse is an unfortunate 86 year old lady with multiple medical issues. She is here accompanied by her daughter. She tells me that she was in the hospital for heart failure in April. She had a painful area over the left medial malleolus and a nurse put some form of foam cover on this. When she took the  foam cover off when she got home after she was discharged she had a open wound which is very painful. She went to see Dr. Ronnald Ramp of dermatology who put some form of waterproof Band-Aid on this. This has not helped. She was seen by her oncologist who referred her here [Dr. Feng]. The patient states that she is in a lot of pain because of this it hurts at night. During our review by our intake nurse it was noted that she also  had a small punched-out area on the right medial first MTP and an area on her dorsal right fifth toe. She does not give a claudication history in fact she says that her legs actually feel better when she is walking. Past medical history she has multiple serious past medical issues including metastatic breast cancer on an aromatase inhibitor. 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 I reviewed her echocardiogram from 12/09/2020 she had normal ejection fraction but also noted to have severe apical hypertrophy/ASH. Her right ventricular systolic function was severely reduced with severely elevated pulmonary artery pressures. He also has a history of bladder cancer she has had double mastectomy she is on chronic oxygen. For the last year she has had very significant discoloration of her forefeet especially when they are dependent. This is clearly not a lifelong phenomenon ABIs were done in our clinic and surprisingly were quite good with an ABI of 1.06 on the right and 1.04 on the left Patient History Information obtained from Patient. Allergies heparin, morphine Family History Cancer - Child,Mother, No family history of Diabetes, Heart Disease, Hereditary Spherocytosis, Hypertension, Kidney Disease, Lung Disease, Seizures, Stroke, Thyroid Problems, Tuberculosis. Social History Former smoker - Smoked for 40 years, Quit 20 yrs ago, Marital Status - Married, Alcohol Use - Never, Drug Use - No History, Caffeine Use - Rarely. Medical History Eyes Patient has history of Glaucoma Respiratory Patient has history of Chronic Obstructive Pulmonary Disease (COPD) - O2 @ [place] Cardiovascular Patient has history of Arrhythmia, Congestive Heart Failure, Hypertension Denies history of Deep Vein Thrombosis Musculoskeletal Patient has history of Osteoarthritis Oncologic Patient has history of Received Chemotherapy Denies history of Received Radiation Medical A Surgical History  Notes nd Cardiovascular Stroke Gastrointestinal GERD Genitourinary Bladder Cancer Oncologic Bladder Cancer - Breast Cancer Review of Systems (ROS) Ear/Nose/Mouth/Throat Denies complaints or symptoms of Chronic sinus problems or rhinitis. Endocrine Denies complaints or symptoms of Heat/cold intolerance. Genitourinary Denies complaints or symptoms of Frequent urination. Integumentary (Skin) Complains or has symptoms of Wounds. Psychiatric Denies complaints or symptoms of Claustrophobia, Suicidal. Objective Constitutional Patient is hypotensive.. Pulse regular and within target range for patient.Marland Kitchen Respiratory effort pronounced and rate above normal.. Temperature is normal and within the target range for the patient.Marland Kitchen Appears somewhat dyspneic. Vitals Time Taken: 3:06 PM, Temperature: 97.7 F, Pulse: 86 bpm, Respiratory Rate: 22 breaths/min, Blood Pressure: 94/60 mmHg. Cardiovascular 3-6 systolic murmur at the lower left sternal border likely tricuspid regurgitation her JVP is elevated to the angle of the jaw. Some sacral edema. Pedal pulses are difficult to feel although I think I was able to feel dorsalis pedis pulses I could not feel her popliteal pulses. Femoral pulses were palpable. Marked edema in her lower extremities extending up into her thighs 2-3+. Pitting very tender but not particularly warm.. General Notes: Wound exam; oo The area she came in about on the left medial malleolus had a raised necrotic surface. I remove this with a #5 curette after discussion about whether she  wanted to move forward with this. The surface of this was not particularly viable after debridement but it Alese give the chance for Santyl to try and finish the job oo She has a small punched-out area over the right lateral malleolus and the right fifth toe. These were very tiny Integumentary (Hair, Skin) Wound #1 status is Open. Original cause of wound was Other Lesion. The date acquired was:  12/12/2020. The wound is located on the Left,Medial Ankle. The wound measures 0.7cm length x 1.2cm width x 0.2cm depth; 0.66cm^2 area and 0.132cm^3 volume. There is Fat Layer (Subcutaneous Tissue) exposed. There is no tunneling or undermining noted. There is a medium amount of purulent drainage noted. The wound margin is distinct with the outline attached to the wound base. There is small (1-33%) red granulation within the wound bed. There is a large (67-100%) amount of necrotic tissue within the wound bed including Adherent Slough. Wound #2 status is Open. Original cause of wound was Other Lesion. The date acquired was: 02/06/2021. The wound is located on the Right,Medial Foot. The wound measures 0.3cm length x 0.4cm width x 0.2cm depth; 0.094cm^2 area and 0.019cm^3 volume. There is Fat Layer (Subcutaneous Tissue) exposed. There is no tunneling or undermining noted. There is a medium amount of serosanguineous drainage noted. The wound margin is distinct with the outline attached to the wound base. There is medium (34-66%) pale granulation within the wound bed. There is a medium (34-66%) amount of necrotic tissue within the wound bed including Adherent Slough. Wound #3 status is Open. Original cause of wound was Other Lesion. The date acquired was: 02/06/2021. The wound is located on the Right T Fifth. The wound oe measures 0.1cm length x 0.2cm width x 0.1cm depth; 0.016cm^2 area and 0.002cm^3 volume. There is Fat Layer (Subcutaneous Tissue) exposed. There is no tunneling or undermining noted. There is a medium amount of serosanguineous drainage noted. The wound margin is distinct with the outline attached to the wound base. There is large (67-100%) red, pink granulation within the wound bed. There is no necrotic tissue within the wound bed. 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 There was a Excisional Skin/Subcutaneous Tissue Debridement with a total area of 1 sq cm performed by Ricard Dillon., MD. With the following instrument(s): Curette to remove Viable and Non-Viable tissue/material. Material removed includes Subcutaneous Tissue, Slough, Skin: Dermis, Skin: Epidermis, and Fibrin/Exudate after achieving pain control using Other (benzocaine 20%). A time out was conducted at 14:20, prior to the start of the procedure. A Minimum amount of bleeding was controlled with Pressure. The procedure was tolerated well with a pain level of 3 throughout and a pain level of 4 following the procedure. Post Debridement Measurements: 0.7cm length x 1.2cm width x 0.2cm depth; 0.132cm^3 volume. Character of Wound/Ulcer Post Debridement requires further debridement. Severity of Tissue Post Debridement is: Fat layer exposed. 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  There was a Chemical/Enzymatic/Mechanical debridement performed by Baruch Gouty, RN.Marland Kitchen Agent used was [employer]. There was no bleeding. The procedure was tolerated well. Post Debridement Measurements: 0.3cm length x 0.4cm width x 0.2cm depth; 0.019cm^3 volume. Character of Wound/Ulcer Post Debridement requires further debridement. Severity of Tissue Post Debridement is: Fat layer exposed. Post procedure Diagnosis Wound #2: Same as Pre-Procedure Wound #3 Pre-procedure diagnosis  of Wound #3 is an Arterial Insufficiency Ulcer located on the Right T Fifth .Severity of Tissue Pre Debridement is: Fat layer oe exposed. There was a Excisional Skin/Subcutaneous Tissue Debridement with a total area of 0.02 sq cm performed by Ricard Dillon., MD. With the following instrument(s): Curette to remove Viable and Non-Viable tissue/material. Material removed includes Subcutaneous Tissue, Slough, Skin: Dermis, Skin: Epidermis, and Fibrin/Exudate after achieving pain control using Other (benzocaine 20%). A time out was conducted at 14:20, prior to the start of the procedure. A Minimum amount of bleeding was controlled with Pressure. The procedure was tolerated well with a pain level of 3 throughout and a pain level of 4 following the procedure. Post Debridement Measurements: 0.1cm length x 0.2cm width x 0.1cm depth; 0.002cm^3 volume. Character of Wound/Ulcer Post Debridement requires further debridement. Severity of Tissue Post Debridement is: Fat layer exposed. Post procedure Diagnosis Wound #3: Same as Pre-Procedure Plan Follow-up Appointments: Return Appointment in 1 week. - Dr. Dellia Nims Bathing/ Shower/ Hygiene: May shower and wash wound with soap and water. - with dressing changes. Edema Control - Lymphedema / SCD / Other: Elevate legs to the level of the heart or above for 30 minutes daily and/or when sitting, a frequency of: - 3-4 times a day throughout the day. Avoid standing for long periods of time. Other Edema Control Orders/Instructions: - do not sleep in recliner with legs down. Services and Therapies ordered were: Arterial Studies- Bilateral - refer to Dr. Gwenlyn Found office- arterial studies with ABIs and TBIs. Someone from Dr. Gwenlyn Found office will call you to set up appointment time. The following medication(s) was prescribed: benzocaine topical 20 % aerosol aerosol topical applied only in clinic for debridements. was prescribed at facility WOUND #1: - Ankle Wound Laterality:  Left, Medial Cleanser: Soap and Water 1 x Per Day/30 Days Discharge Instructions: May shower and wash wound with dial antibacterial soap and water prior to dressing change. Peri-Wound Care: Skin Prep (DME) (Generic) 1 x Per Day/30 Days Discharge Instructions: Use skin prep as directed Prim Dressing: Santyl Ointment 1 x Per Day/30 Days ary Discharge Instructions: Apply nickel thick amount to wound bed as instructed Secondary Dressing: Zetuvit Plus Silicone Border Dressing 4x4 (in/in) (DME) (Generic) 1 x Per Day/30 Days Discharge Instructions: Apply silicone border over primary dressing as directed. WOUND #2: - Foot Wound Laterality: Right, Medial Cleanser: Soap and Water 1 x Per Day/30 Days Discharge Instructions: May shower and wash wound with dial antibacterial soap and water prior to dressing change. Peri-Wound Care: Skin Prep (DME) (Generic) 1 x Per Day/30 Days Discharge Instructions: Use skin prep as directed Prim Dressing: Santyl Ointment 1 x Per Day/30 Days ary Discharge Instructions: Apply nickel thick amount to wound bed as instructed Secondary Dressing: Zetuvit Plus Silicone Border Dressing 4x4 (in/in) (DME) (Generic) 1 x Per Day/30 Days Discharge Instructions: Apply silicone border over primary dressing as directed. WOUND #3: - T Fifth Wound Laterality: Right oe Cleanser: Soap and Water 1 x Per Day/30 Days Discharge Instructions: May shower and wash wound with dial antibacterial soap and water prior to dressing change. 1. I was able  to debride her major wound. We are going to treat it with Santyl and bordered foam change daily I will prescribe this for her 2. She has a large amount of edema in her bilateral lower extremity. This is pitting. I think she has systemic fluid volume overload or at least right ventricular failure. I will try to put a note out to Dr. Stanford Breed who is her cardiologist 3. I think she probably has some degree of arterial insufficiency even though the ABIs  in our clinic were reasonably normal she has cyanotic toes. Possibly her ABIs were artificially elevated secondary to calcification or the edema. After some discussion vis--vis the rest of her medical issues I decided to go ahead with ordering noninvasive studies even though it is unlikely she had be a candidate for any further invasive treatment even peripheral treatment I thought it was important to know what we are dealing with here diagnostically. 4. She may very well have chronic venous insufficiency as well although these are not behaving as venous insufficiency ulcers. 5. No evidence of infection Electronic Signature(s) Signed: 02/23/2021 5:20:02 PM By: Linton Ham MD Signed: 02/23/2021 5:20:02 PM By: Linton Ham MD Entered By: Linton Ham on 02/23/2021 17:06:56 -------------------------------------------------------------------------------- [facility] Details Patient Name: Date of Service: [name] RA S. 02/23/2021 2:45 PM Medical Record Number: [id number] Patient Account Number: [id number] Date of Birth/Sex: Treating RN: Dec 25, 1934 (86 y.o. Female) Lorrin Jackson Primary Care Provider: [name] Other Clinician: Referring Provider: Treating Provider/Extender: Domingo Sep in Treatment: 0 Information Obtained From Patient Ear/Nose/Mouth/Throat Complaints and Symptoms: Negative for: Chronic sinus problems or rhinitis Endocrine Complaints and Symptoms: Negative for: Heat/cold intolerance Genitourinary Complaints and Symptoms: Negative for: Frequent urination Medical History: Past Medical History Notes: Bladder Cancer Integumentary (Skin) Complaints and Symptoms: Positive for: Wounds Psychiatric Complaints and Symptoms: Negative for: Claustrophobia; Suicidal Eyes Medical History: Positive for: Glaucoma Hematologic/Lymphatic Respiratory Medical History: Positive for: Chronic Obstructive Pulmonary Disease (COPD) - O2 @  4l Cardiovascular Medical History: Positive for: Arrhythmia; Congestive Heart Failure; Hypertension Negative for: Deep Vein Thrombosis Past Medical History Notes: Stroke Gastrointestinal Medical History: Past Medical History Notes: GERD Immunological Musculoskeletal Medical History: Positive for: Osteoarthritis Oncologic Medical History: Positive for: Received Chemotherapy Negative for: Received Radiation Past Medical History Notes: Bladder Cancer - Breast Cancer HBO Extended History Items Eyes: Glaucoma Immunizations Pneumococcal Vaccine: Received Pneumococcal Vaccination: Yes Implantable Devices None Family and Social History Cancer: Yes - Child,Mother; Diabetes: No; Heart Disease: No; Hereditary Spherocytosis: No; Hypertension: No; Kidney Disease: No; Lung Disease: No; Seizures: No; Stroke: No; Thyroid Problems: No; Tuberculosis: No; Former smoker - Smoked for 40 years, Quit 20 yrs ago; Marital Status - Married; Alcohol Use: Never; Drug Use: No History; Caffeine Use: Rarely; Financial Concerns: No; Food, Clothing or Shelter Needs: No; Support System Lacking: No; Transportation Concerns: No Engineer, maintenance) Signed: 02/23/2021 5:20:02 PM By: Linton Ham MD Signed: 02/23/2021 5:27:52 PM By: Lorrin Jackson Entered By: Lorrin Jackson on 02/23/2021 15:18:36 -------------------------------------------------------------------------------- [place] Details Patient Name: Date of Service: [name] RBA RA S. 02/23/2021 Medical Record Number: [id number] Patient Account Number: [id number] Date of Birth/Sex: Treating RN: [birth year] (86 y.o. 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 I70.235 Atherosclerosis of native arteries of right leg with ulceration of other part of foot Facility Procedures CPT4 Code: [id number] Description: [place] VISIT-LEV 3 EST PT Modifier: Quantity: 1 CPT4 Code: [id number] Description: 99780 - DEBRIDE W/O ANES NON SELECT Modifier: Quantity: 1 Physician Procedures : CPT4 Code Description Modifier [id number] - WC PHYS LEVEL 4 - NEW PT 25 ICD-10 Diagnosis Description L97.328 Non-pressure chronic ulcer of left ankle with other specified severity L97.518 Non-pressure chronic ulcer of other part of right foot with  other specified severity 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

## 2021-03-01 ENCOUNTER — Encounter (HOSPITAL_BASED_OUTPATIENT_CLINIC_OR_DEPARTMENT_OTHER): Payer: Medicare Other | Admitting: Internal Medicine

## 2021-03-01 ENCOUNTER — Other Ambulatory Visit: Payer: Self-pay

## 2021-03-01 DIAGNOSIS — L97518 Non-pressure chronic ulcer of other part of right foot with other specified severity: Secondary | ICD-10-CM | POA: Diagnosis not present

## 2021-03-01 DIAGNOSIS — I70243 Atherosclerosis of native arteries of left leg with ulceration of ankle: Secondary | ICD-10-CM | POA: Diagnosis not present

## 2021-03-01 DIAGNOSIS — I110 Hypertensive heart disease with heart failure: Secondary | ICD-10-CM | POA: Diagnosis not present

## 2021-03-01 DIAGNOSIS — I509 Heart failure, unspecified: Secondary | ICD-10-CM | POA: Diagnosis not present

## 2021-03-01 DIAGNOSIS — L97512 Non-pressure chronic ulcer of other part of right foot with fat layer exposed: Secondary | ICD-10-CM | POA: Diagnosis not present

## 2021-03-01 DIAGNOSIS — L97322 Non-pressure chronic ulcer of left ankle with fat layer exposed: Secondary | ICD-10-CM | POA: Diagnosis not present

## 2021-03-01 DIAGNOSIS — L97328 Non-pressure chronic ulcer of left ankle with other specified severity: Secondary | ICD-10-CM | POA: Diagnosis not present

## 2021-03-01 DIAGNOSIS — I70235 Atherosclerosis of native arteries of right leg with ulceration of other part of foot: Secondary | ICD-10-CM | POA: Diagnosis not present

## 2021-03-01 NOTE — Progress Notes (Signed)
ALYONA, ROMACK ([id number]) Visit Report for 03/01/2021 Debridement Details Patient Name: Date of Service: CARITA, SOLLARS [facility] RA S. 03/01/2021 2:30 PM Medical Record Number: [id number] Patient Account Number: [id number] Date of Birth/Sex: Treating RN: October 25, 1934 (86 y.o. Nancy Fetter Primary Care Provider: [name] Other Clinician: Referring Provider: Treating Provider/Extender: Pauletta Browns in Treatment: 0 Debridement Performed for Assessment: Wound #1 Left,Medial Ankle Performed By: Clinician Levan Hurst, RN Debridement Type: Chemical/Enzymatic/Mechanical Agent Used: Santyl Severity of Tissue Pre Debridement: Fat layer exposed Level of Consciousness (Pre-procedure): Awake and Alert Pre-procedure Verification/Time Out Yes - 15:40 Taken: Bleeding: Moderate Response to Treatment: Procedure was tolerated well Level of Consciousness (Post- Awake and Alert procedure): Post Debridement Measurements of Total Wound Length: (cm) 1 Width: (cm) 1.5 Depth: (cm) 0.1 Volume: (cm) 0.118 Character of Wound/Ulcer Post Debridement: Requires Further Debridement Severity of Tissue Post Debridement: Fat layer exposed Post Procedure Diagnosis Same as Pre-procedure Electronic Signature(s) Signed: 03/01/2021 4:56:05 PM By: Levan Hurst RN, BSN Signed: 03/01/2021 4:58:19 PM By: Linton Ham MD Entered By: Levan Hurst on 03/01/2021 15:41:53 -------------------------------------------------------------------------------- Debridement Details Patient Name: Date of Service: [name] S. 03/01/2021 2:30 PM Medical Record Number: [id number] Patient Account Number: [id number] Date of Birth/Sex: Treating RN: June 09, 1935 (86 y.o. Nancy Fetter Primary Care Provider: [name] Other Clinician: Referring Provider: Treating Provider/Extender: Pauletta Browns in Treatment: 0 Debridement Performed for Assessment: Wound #2  Right,Medial Foot Performed By: Clinician Levan Hurst, RN Debridement Type: Chemical/Enzymatic/Mechanical Agent Used: Santyl Severity of Tissue Pre Debridement: Fat layer exposed Level of Consciousness (Pre-procedure): Awake and Alert Pre-procedure Verification/Time Out Yes - 15:40 Taken: Bleeding: Moderate Response to Treatment: Procedure was tolerated well Level of Consciousness (Post- Awake and Alert Awake and Alert procedure): Post Debridement Measurements of Total Wound Length: (cm) 0.5 Width: (cm) 0.5 Depth: (cm) 0.2 Volume: (cm) 0.039 Character of Wound/Ulcer Post Debridement: Requires Further Debridement Severity of Tissue Post Debridement: Fat layer exposed Post Procedure Diagnosis Same as Pre-procedure Electronic Signature(s) Signed: 03/01/2021 4:56:05 PM By: Levan Hurst RN, BSN Signed: 03/01/2021 4:58:19 PM By: Linton Ham MD Entered By: Levan Hurst on 03/01/2021 15:42:16 -------------------------------------------------------------------------------- Debridement Details Patient Name: Date of Service: [name] S. 03/01/2021 2:30 PM Medical Record Number: [id number] Patient Account Number: [id number] Date of Birth/Sex: Treating RN: [birth year] (86 y.o. Nancy Fetter Primary Care Provider: [name] Other Clinician: Referring Provider: Treating Provider/Extender: Pauletta Browns in Treatment: 0 Debridement Performed for Assessment: Wound #4 Left Calcaneus Performed By: Clinician Levan Hurst, RN Debridement Type: Chemical/Enzymatic/Mechanical Agent Used: Santyl Level of Consciousness (Pre-procedure): Awake and Alert Pre-procedure Verification/Time Out Yes - 15:40 Taken: Bleeding: Moderate Response to Treatment: Procedure was tolerated well Level of Consciousness (Post- Awake and Alert procedure): Post Debridement Measurements of Total Wound Length: (cm) 1 Stage: Category/Stage III Width: (cm) 0.5 Depth:  (cm) 0.1 Volume: (cm) 0.039 Character of Wound/Ulcer Post Debridement: Requires Further Debridement Post Procedure Diagnosis Same as Pre-procedure Electronic Signature(s) Signed: 03/01/2021 4:56:05 PM By: Levan Hurst RN, BSN Signed: 03/01/2021 4:58:19 PM By: Linton Ham MD Entered By: Levan Hurst on 03/01/2021 15:42:34 -------------------------------------------------------------------------------- HPI Details Patient Name: Date of Service: [name] S. 03/01/2021 2:30 PM Medical Record Number: [id number] Patient Account Number: [id number] Date of Birth/Sex: Treating RN: 1935/03/08 (86 y.o. Nancy Fetter Primary Care Provider: Other Clinician: [name] Referring Provider: Treating Provider/Extender: Pauletta Browns in Treatment: 0 History of Present Illness HPI Description: ADMISSION 02/23/2021 Mrs. Faulcon  is an unfortunate 86 year old lady with multiple medical issues. She is here accompanied by her daughter. She tells me that she was in the hospital for heart failure in April. She had a painful area over the left medial malleolus and a nurse put some form of foam cover on this. When she took the foam cover off when she got home after she was discharged she had a open wound which is very painful. She went to see Dr. Ronnald Ramp of dermatology who put some form of waterproof Band-Aid on this. This has not helped. She was seen by her oncologist who referred her here [Dr. Feng]. The patient states that she is in a lot of pain because of this it hurts at night. During our review by our intake nurse it was noted that she also had a small punched-out area on the right medial first MTP and an area on her dorsal right fifth toe. She does not give a claudication history in fact she says that her legs actually feel better when she is walking. Past medical history she has multiple serious past medical issues including metastatic breast cancer on an  aromatase inhibitor. 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 I reviewed her echocardiogram from 12/09/2020 she had normal ejection fraction but also noted to have severe apical hypertrophy/ASH. Her right ventricular systolic function was severely reduced with severely elevated pulmonary artery pressures. He also has a history of bladder cancer she has had double mastectomy she is on chronic oxygen. For the last year she has had very significant discoloration of her forefeet especially when they are dependent. This is clearly not a lifelong phenomenon ABIs were done in our clinic and surprisingly were quite good with an ABI of 1.06 on the right and 1.04 on the left 03/01/2021; the patient's arterial studies are not until 7/18; I am looking for diagnostics here recognizing that she will not be a candidate for even percutaneous interventions. She has cyanosis of her toes. She arrived in clinic today with a new area on her left Achilles heel and also her plantar left first toe. This is in addition to the right fifth toe dorsally, the right first met head medially and the left medial ankle Electronic Signature(s) Signed: 03/01/2021 4:58:19 PM By: Linton Ham MD Entered By: Linton Ham on 03/01/2021 16:13:15 -------------------------------------------------------------------------------- Physical Exam Details Patient Name: Date of Service: [name] RA S. 03/01/2021 2:30 PM Medical Record Number: [id number] Patient Account Number: [id number] Date of Birth/Sex: Treating RN: [birth year] (86 y.o. Nancy Fetter Primary Care Provider: [name] Other Clinician: Referring Provider: Treating Provider/Extender: Pauletta Browns in Treatment: 0 Constitutional Patient is hypotensive.. Pulse regular and within target range for patient.Marland Kitchen Respiratory effort pronounced and rate above normal.. Temperature is normal and within the target range  for the patient.Marland Kitchen Appears in no distress. Respiratory Positive accessory muscle use. Cardiovascular . Notes Wound exam The patient has the wounds that she came in with last week worse over the medial right first metatarsal head but also the left medial malleolus and left dorsal fifth toe. She has new wounds on the left Achilles heel and the plantar left first toe. Electronic Signature(s) Signed: 03/01/2021 4:58:19 PM By: Linton Ham MD Entered By: Linton Ham on 03/01/2021 16:14:42 -------------------------------------------------------------------------------- Physician Orders Details Patient Name: Date of Service: [name] RA S. 03/01/2021 2:30 PM Medical Record Number: [id number] Patient Account Number: [id number] Date of Birth/Sex: Treating RN: 08-06-1935 (86 y.o. 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 - Dr. Dellia Nims Return A Bathing/ Shower/ Hygiene May shower and wash wound with soap and water. - with dressing changes. Edema Control - Lymphedema / SCD / Other Elevate legs to the level of the heart or above for 30 minutes daily and/or when sitting, a frequency of: - 3-4 times a day throughout the day. Avoid standing for long periods of time. Other Edema Control Orders/Instructions: - do not sleep in recliner with legs down. Non Wound Condition Other Non Wound Condition  Orders/Instructions: - Apply silver alginate, gauze, kling to left plantar great toe Wound Treatment Wound #1 - Ankle Wound Laterality: Left, Medial Cleanser: Soap and Water 1 x Per Day/30 Days Discharge Instructions: May shower and wash wound with dial antibacterial soap and water prior to dressing change. Peri-Wound Care: Skin Prep (Generic) 1 x Per Day/30 Days Discharge Instructions: Use skin prep as directed Prim Dressing: Santyl Ointment 1 x Per Day/30 Days ary Discharge Instructions: Apply nickel thick amount to wound bed as instructed Secondary Dressing: Bordered Gauze, 2x3.75 in (DME) (Generic) 1 x Per Day/30 Days Discharge Instructions: Apply over primary dressing as directed. Wound #2 - Foot Wound Laterality: Right, Medial Cleanser: Soap and Water 1 x Per Day/30 Days Discharge Instructions: May shower and wash wound with dial antibacterial soap and water prior to dressing change. Peri-Wound Care: Skin Prep (Generic) 1 x Per Day/30 Days Discharge Instructions: Use skin prep as directed Prim Dressing: Santyl Ointment 1 x Per Day/30 Days ary Discharge Instructions: Apply nickel thick amount to wound bed as instructed Secondary Dressing: Bordered Gauze, 2x3.75 in (DME) (Generic) 1 x Per Day/30 Days Discharge Instructions: Apply over primary dressing as directed. Wound #3 - T Fifth oe Wound Laterality: Right Cleanser: Soap and Water 1 x Per Day/30 Days Discharge Instructions: May shower and wash wound with dial antibacterial soap and water prior to dressing change. Prim Dressing: KerraCel Ag Gelling Fiber Dressing, 2x2 in (silver alginate) (DME) (Generic) 1 x Per Day/30 Days ary Discharge Instructions: Apply silver alginate to wound bed as instructed Secondary Dressing: Woven Gauze Sponges 2x2 in 1 x Per Day/30 Days Discharge Instructions: Apply over primary dressing as directed. Secured With: Paper Tape, 1x10 (in/yd) 1 x Per Day/30 Days Discharge Instructions: Secure dressing  with tape as directed. Wound #4 - Calcaneus Wound Laterality: Left Cleanser: Soap and Water 1 x Per Day/30 Days Discharge Instructions: May shower and wash wound with dial antibacterial soap and water prior to dressing change. Peri-Wound Care: Skin Prep (Generic) 1 x Per Day/30 Days Discharge Instructions: Use skin prep as directed Prim Dressing: Santyl Ointment 1 x Per Day/30 Days ary Discharge Instructions: Apply nickel thick amount to wound bed as instructed Secondary Dressing: Bordered Gauze, 2x3.75 in (DME) (Generic) 1 x Per Day/30 Days Discharge Instructions: Apply over primary dressing as directed. Electronic Signature(s) Signed: 03/01/2021 4:56:05 PM By: Levan Hurst RN, BSN Signed: 03/01/2021 4:58:19 PM By: Linton Ham MD Entered By: Levan Hurst on 03/01/2021 15:43:08 -------------------------------------------------------------------------------- Problem List Details  Patient Name: Date of Service: KAMERA, DUBAS [facility] RA S. 03/01/2021 2:30 PM Medical Record Number: [id number] Patient Account Number: [id number] Date of Birth/Sex: Treating RN: [birth year] (86 y.o. 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 03/01/2021 2:30 PM Medical Record Number: [id number] Patient Account Number: [id number] Date of Birth/Sex: Treating RN: 03/10/1935 (86 y.o. Nancy Fetter Primary Care Provider: [name] Other Clinician: Referring Provider: Treating Provider/Extender: Pauletta Browns in Treatment: 0 Subjective History of Present Illness (HPI) ADMISSION 02/23/2021 Mrs. Blankenbaker is an unfortunate 86 year old lady with multiple medical issues. She is here accompanied by her daughter. She tells me that she was in the hospital for heart failure in April. She had a painful area over the left medial malleolus and a nurse put some form of foam cover on this. When she took the foam cover off when she got home after she was discharged she had a open wound which is very painful. She went to see Dr. Ronnald Ramp of dermatology who put some form of waterproof Band-Aid on this. This has not helped. She was seen by her oncologist who referred her here [Dr. Feng]. The patient states that she is in a lot of pain because of this it hurts at night. During our review by our intake nurse it was noted that she also had a small punched-out area on the right medial first MTP and an area on her dorsal right fifth toe. She does not give a claudication history in fact she says that her legs actually feel better when she is walking. Past medical history she has multiple serious past medical issues including metastatic breast cancer on an aromatase inhibitor. 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 I reviewed her echocardiogram from 12/09/2020 she had normal ejection fraction but also noted to have severe apical hypertrophy/ASH. Her right ventricular systolic function was severely reduced with severely elevated pulmonary  artery pressures. He also has a history of bladder cancer she has had double mastectomy she is on chronic oxygen. For the last year she has had very significant discoloration of her forefeet especially when they are dependent. This is clearly not a lifelong phenomenon ABIs were done in our clinic and surprisingly were quite good with an ABI of 1.06 on the right and 1.04 on the left 03/01/2021; the patient's arterial studies are not until 7/18; I am looking for diagnostics here recognizing that she will not be a candidate for even percutaneous interventions. She has cyanosis of her toes. She arrived in clinic today with a new area on her left Achilles heel and also her plantar left first toe. This is in addition to the right fifth toe dorsally, the right first met head medially and the left medial ankle Objective Constitutional Patient  is hypotensive.. Pulse regular and within target range for patient.Marland Kitchen Respiratory effort pronounced and rate above normal.. Temperature is normal and within the target range for the patient.Marland Kitchen Appears in no distress. Vitals Time Taken: 3:06 PM, Temperature: 97.5 F, Pulse: 77 bpm, Respiratory Rate: 22 breaths/min, Blood Pressure: 84/52 mmHg. Respiratory Positive accessory muscle use. General Notes: Wound exam oo The patient has the wounds that she came in with last week worse over the medial right first metatarsal head but also the left medial malleolus and left dorsal fifth toe. She has new wounds on the left Achilles heel and the plantar left first toe. Integumentary (Hair, Skin) Wound #1 status is Open. Original cause of wound was Other Lesion. The date acquired was: 12/12/2020. The wound is located on the Left,Medial Ankle. The wound measures 1cm length x 1.5cm width x 0.1cm depth; 1.178cm^2 area and 0.118cm^3 volume. There is Fat Layer (Subcutaneous Tissue) exposed. There is no tunneling or undermining noted. There is a medium amount of serosanguineous drainage  noted. The wound margin is distinct with the outline attached to the wound base. There is small (1-33%) pink granulation within the wound bed. There is a large (67-100%) amount of necrotic tissue within the wound bed including Adherent Slough. Wound #2 status is Open. Original cause of wound was Other Lesion. The date acquired was: 02/06/2021. The wound is located on the Right,Medial Foot. The wound measures 0.5cm length x 0.5cm width x 0.2cm depth; 0.196cm^2 area and 0.039cm^3 volume. There is Fat Layer (Subcutaneous Tissue) exposed. There is no tunneling or undermining noted. There is a medium amount of serosanguineous drainage noted. The wound margin is distinct with the outline attached to the wound base. There is medium (34-66%) pink, pale granulation within the wound bed. There is a medium (34-66%) amount of necrotic tissue within the wound bed including Adherent Slough. Wound #3 status is Open. Original cause of wound was Other Lesion. The date acquired was: 02/06/2021. The wound is located on the Right T Fifth. The wound oe measures 0.2cm length x 0.2cm width x 0.1cm depth; 0.031cm^2 area and 0.003cm^3 volume. There is Fat Layer (Subcutaneous Tissue) exposed. There is no tunneling or undermining noted. There is a medium amount of serosanguineous drainage noted. The wound margin is distinct with the outline attached to the wound base. There is large (67-100%) pink granulation within the wound bed. There is no necrotic tissue within the wound bed. Wound #4 status is Open. Original cause of wound was Pressure Injury. The date acquired was: 03/01/2021. The wound is located on the Left Calcaneus. The wound measures 1cm length x 0.5cm width x 0.1cm depth; 0.393cm^2 area and 0.039cm^3 volume. There is Fat Layer (Subcutaneous Tissue) exposed. There is no tunneling or undermining noted. There is a medium amount of serosanguineous drainage noted. The wound margin is flat and intact. There is large  (67- 100%) pink granulation within the wound bed. There is a small (1-33%) amount of necrotic tissue within the wound bed including Adherent Slough. 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 There was a Chemical/Enzymatic/Mechanical debridement performed  by Levan Hurst, RN.Marland Kitchen Agent used was [employer]. A time out was conducted at 15:40, prior to the start of the procedure. A Moderate amount of bleeding was controlled with N/A. The procedure was tolerated well. Post Debridement Measurements: 1cm length x 1.5cm width x 0.1cm depth; 0.118cm^3 volume. Character of Wound/Ulcer Post Debridement requires further debridement. Severity of Tissue Post Debridement is: Fat layer exposed. 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 There was a Chemical/Enzymatic/Mechanical debridement performed by Levan Hurst, RN.Marland Kitchen Agent used was [employer]. A time out was conducted at 15:40, prior to the start of the procedure. A Moderate amount of bleeding was controlled with N/A. The procedure was tolerated well. Post Debridement Measurements: 0.5cm length x 0.5cm width x 0.2cm depth; 0.039cm^3 volume. Character of Wound/Ulcer Post Debridement requires further debridement. Severity of Tissue Post Debridement is: Fat layer exposed. Post procedure Diagnosis Wound  #2: Same as Pre-Procedure Wound #4 Pre-procedure diagnosis of Wound #4 is a Pressure Ulcer located on the Left Calcaneus . There was a Chemical/Enzymatic/Mechanical debridement performed by Levan Hurst, RN.Marland Kitchen Agent used was [employer]. A time out was conducted at 15:40, prior to the start of the procedure. A Moderate amount of bleeding was controlled with N/A. The procedure was tolerated well. Post Debridement Measurements: 1cm length x 0.5cm width x 0.1cm depth; 0.039cm^3 volume. Post debridement Stage noted as Category/Stage III. Character of Wound/Ulcer Post Debridement requires further debridement. Post procedure Diagnosis Wound #4: Same as Pre-Procedure Plan Follow-up Appointments: Return Appointment in 1 week. - Dr. Dellia Nims Bathing/ Shower/ Hygiene: May shower and wash wound with soap and water. - with dressing changes. Edema Control - Lymphedema / SCD / Other: Elevate legs to the level of the heart or above for 30 minutes daily and/or when sitting, a frequency of: - 3-4 times a day throughout the day. Avoid standing for long periods of time. Other Edema Control Orders/Instructions: - do not sleep in recliner with legs down. Non Wound Condition: Other Non Wound Condition Orders/Instructions: - Apply silver alginate, gauze, kling to left plantar great toe WOUND #1: - Ankle Wound Laterality: Left, Medial Cleanser: Soap and Water 1 x Per Day/30 Days Discharge Instructions: May shower and wash wound with dial antibacterial soap and water prior to dressing change. Peri-Wound Care: Skin Prep (Generic) 1 x Per Day/30 Days Discharge Instructions: Use skin prep as directed Prim Dressing: Santyl Ointment 1 x Per Day/30 Days ary Discharge Instructions: Apply nickel thick amount to wound bed as instructed Secondary Dressing: Bordered Gauze, 2x3.75 in (DME) (Generic) 1 x Per Day/30 Days Discharge Instructions: Apply over primary dressing as directed. WOUND #2: - Foot Wound Laterality: Right,  Medial Cleanser: Soap and Water 1 x Per Day/30 Days Discharge Instructions: May shower and wash wound with dial antibacterial soap and water prior to dressing change. Peri-Wound Care: Skin Prep (Generic) 1 x Per Day/30 Days Discharge Instructions: Use skin prep as directed Prim Dressing: Santyl Ointment 1 x Per Day/30 Days ary Discharge Instructions: Apply nickel thick amount to wound bed as instructed Secondary Dressing: Bordered Gauze, 2x3.75 in (DME) (Generic) 1 x Per Day/30 Days Discharge Instructions: Apply over primary dressing as directed. WOUND #3: - T Fifth Wound Laterality: Right oe Cleanser: Soap and Water 1 x Per Day/30 Days Discharge Instructions: May shower and wash wound with dial antibacterial soap and water prior to dressing change.  Prim Dressing: KerraCel Ag Gelling Fiber Dressing, 2x2 in (silver alginate) (DME) (Generic) 1 x Per Day/30 Days ary Discharge Instructions: Apply silver alginate to wound bed as instructed Secondary Dressing: Woven Gauze Sponges 2x2 in 1 x Per Day/30 Days Discharge Instructions: Apply over primary dressing as directed. Secured With: Paper Tape, 1x10 (in/yd) 1 x Per Day/30 Days Discharge Instructions: Secure dressing with tape as directed. WOUND #4: - Calcaneus Wound Laterality: Left Cleanser: Soap and Water 1 x Per Day/30 Days Discharge Instructions: May shower and wash wound with dial antibacterial soap and water prior to dressing change. Peri-Wound Care: Skin Prep (Generic) 1 x Per Day/30 Days Discharge Instructions: Use skin prep as directed Prim Dressing: Santyl Ointment 1 x Per Day/30 Days ary Discharge Instructions: Apply nickel thick amount to wound bed as instructed Secondary Dressing: Bordered Gauze, 2x3.75 in (DME) (Generic) 1 x Per Day/30 Days Discharge Instructions: Apply over primary dressing as directed. 1. 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 2. Also wondered about some degree of vasospasm as she seems to have the same sort of cyanotic look in her fingers although she may be systemically hypoxic. 3. We are going to use Santyl on the right first met head, left medial malleolus, and the new ankle wound this week. 4. Silver alginate to the left fifth toe and the left first toe 5. I am not optimistic about closing these especially the area on the medial right first met head. 6. She is not as verbal about pain this week Electronic Signature(s) Signed: 03/01/2021 4:58:19 PM By: Linton Ham MD Entered By: Linton Ham on 03/01/2021 16:17:59 -------------------------------------------------------------------------------- SuperBill Details Patient Name: Date of Service: [name] S. 03/01/2021 Medical Record Number: [id number] Patient Account Number: [id number] Date of Birth/Sex: Treating RN: August 21, 1935 (85 y.o. 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

## 2021-03-01 NOTE — Progress Notes (Signed)
[name], [name] ([id number]) Visit Report for 03/01/2021 Arrival Information Details Patient Name: Date of Service: Susan, Dudley [facility] RA S. 03/01/2021 2:30 PM Medical Record Number: [id number] Patient Account Number: [id number] Date of Birth/Sex: Treating RN: 01-25-1935 (86 y.o. Nancy Fetter Primary Care Dezaree Tracey: [name] Other Clinician: Referring Keldan Eplin: Treating Ricardo Schubach/Extender: Pauletta Browns in Treatment: 0 Visit Information History Since Last Visit Added or deleted any medications: No Patient Arrived: Wheel Chair Any new allergies or adverse reactions: No Arrival Time: 15:05 Had a fall or experienced change in No Accompanied By: daughter activities of daily living that may affect Transfer Assistance: None risk of falls: Patient Identification Verified: Yes Signs or symptoms of abuse/neglect since last visito No Secondary Verification Process Completed: Yes Hospitalized since last visit: No Patient Requires Transmission-Based Precautions: No Implantable device outside of the clinic excluding No Patient Has Alerts: No cellular tissue based products placed in the center since last visit: Has Dressing in Place as Prescribed: Yes Pain Present Now: Yes Electronic Signature(s) Signed: 03/01/2021 4:56:05 PM By: Levan Hurst RN, BSN Entered By: Levan Hurst on 03/01/2021 15:06:14 -------------------------------------------------------------------------------- Encounter Discharge Information Details Patient Name: Date of Service: [name] RBA RA S. 03/01/2021 2:30 PM Medical Record Number: [id number] Patient Account Number: [id number] Date of Birth/Sex: Treating RN: [birth year] (86 y.o. Debby Bud Primary Care Thania Woodlief: [name] Other Clinician: Referring Oreste Majeed: Treating Luisangel Wainright/Extender: Pauletta Browns in Treatment: 0 Encounter Discharge Information Items Post Procedure Vitals Discharge Condition:  Stable Temperature (F): 97.5 Ambulatory Status: Wheelchair Pulse (bpm): 77 Discharge Destination: Home Respiratory Rate (breaths/min): 22 Transportation: Private Auto Blood Pressure (mmHg): 84/52 Accompanied By: daughter Schedule Follow-up Appointment: Yes Clinical Summary of Care: Electronic Signature(s) Signed: 03/01/2021 6:10:10 PM By: Deon Pilling Entered By: Deon Pilling on 03/01/2021 18:07:41 -------------------------------------------------------------------------------- Lower Extremity Assessment Details Patient Name: Date of Service: [name] RA S. 03/01/2021 2:30 PM Medical Record Number: [id number] Patient Account Number: [id number] Date of Birth/Sex: Treating RN: 1934/12/10 (86 y.o. Nancy Fetter Primary Care Aleayah Chico: [name] Other Clinician: Referring Shynice Sigel: Treating Dyann Goodspeed/Extender: Pauletta Browns in Treatment: 0 Edema Assessment Assessed: Shirlyn Goltz: No] Patrice Paradise: No] Edema: [Left: Yes] [Right: Yes] Calf Left: Right: Point of Measurement: 30 cm From Medial Instep 32 cm 32 cm Ankle Left: Right: Point of Measurement: 9 cm From Medial Instep 20 cm 19 cm Vascular Assessment Pulses: Dorsalis Pedis Palpable: [Left:Yes] [Right:Yes] Electronic Signature(s) Signed: 03/01/2021 4:56:05 PM By: Levan Hurst RN, BSN Entered By: Levan Hurst on 03/01/2021 15:06:58 -------------------------------------------------------------------------------- Multi Wound Chart Details Patient Name: Date of Service: [name] RBA RA S. 03/01/2021 2:30 PM Medical Record Number: [id number] Patient Account Number: [id number] Date of Birth/Sex: Treating RN: 1935/04/29 (85 y.o. Nancy Fetter Primary Care Kimbly Eanes: [name] Other Clinician: Referring Mylei Brackeen: Treating Jaamal Farooqui/Extender: Pauletta Browns in Treatment: 0 Vital Signs Height(in): Pulse(bpm): 4 Weight(lbs): Blood Pressure(mmHg): 84/52 Body Mass  Index(BMI): Temperature(F): 97.5 Respiratory Rate(breaths/min): 56 Photos: [1:No Photos Left, Medial Ankle] [2:No Photos Right, Medial Foot] [3:No Photos Right T Fifth oe] Wound Location: [1:Other Lesion] [2:Other Lesion] [3:Other Lesion] Wounding Event: [1:Arterial Insufficiency Ulcer] [2:Arterial Insufficiency Ulcer] [3:Arterial Insufficiency Ulcer] Primary Etiology: [1:Glaucoma, Chronic Obstructive] [2:Glaucoma, Chronic Obstructive] [3:Glaucoma, Chronic Obstructive] Comorbid History: [1:Pulmonary Disease (COPD), Arrhythmia, Congestive Heart Failure, Arrhythmia, Congestive Heart Failure, Arrhythmia, Congestive Heart Failure, Hypertension, Osteoarthritis, ReceivedHypertension, Osteoarthritis, ReceivedHypertension,  Osteoarthritis, Received Chemotherapy 12/12/2020] [2:Pulmonary Disease (COPD), Chemotherapy 02/06/2021] [3:Pulmonary Disease (COPD), Chemotherapy 02/06/2021] Date Acquired: [1:0] [2:0] [3:0] Weeks of Treatment: [1:Open] [  2:Open] [3:Open] Wound Status: [1:1x1.5x0.1] [2:0.5x0.5x0.2] [3:0.2x0.2x0.1] Measurements L x W x D (cm) [1:1.178] [2:0.196] [3:0.031] A (cm) : rea [1:0.118] [2:0.039] [3:0.003] Volume (cm) : [1:-78.50%] [2:-108.50%] [3:-93.70%] % Reduction in Area: [1:10.60%] [2:-105.30%] [3:-50.00%] % Reduction in Volume: [1:Full Thickness Without Exposed] [2:Full Thickness Without Exposed] [3:Full Thickness Without Exposed] Classification: [1:Support Structures Medium] [2:Support Structures Medium] [3:Support Structures Medium] Exudate A mount: [1:Serosanguineous] [2:Serosanguineous] [3:Serosanguineous] Exudate Type: [1:red, brown] [2:red, brown] [3:red, brown] Exudate Color: [1:Distinct, outline attached] [2:Distinct, outline attached] [3:Distinct, outline attached] Wound Margin: [1:Small (1-33%)] [2:Medium (34-66%)] [3:Large (67-100%)] Granulation A mount: [1:Pink] [2:Pink, Pale] [3:Pink] Granulation Quality: [1:Large (67-100%)] [2:Medium (34-66%)] [3:None Present  (0%)] Necrotic A mount: [1:Fat Layer (Subcutaneous Tissue): Yes Fat Layer (Subcutaneous Tissue): Yes Fat Layer (Subcutaneous Tissue): Yes] Exposed Structures: [1:Fascia: No Tendon: No Muscle: No Joint: No Bone: No None] [2:Fascia: No Tendon: No Muscle: No Joint: No Bone: No None] [3:Fascia: No Tendon: No Muscle: No Joint: No Bone: No None] Epithelialization: [1:Chemical/Enzymatic/Mechanical] [2:Chemical/Enzymatic/Mechanical] [3:N/A] Debridement: Pre-procedure Verification/Time Out 15:40 [2:15:40] [3:N/A] Taken: [1:N/A] [2:N/A] [3:N/A] Instrument: [1:Moderate] [2:Moderate] [3:N/A] Bleeding: [1:Procedure was tolerated well] [2:Procedure was tolerated well] [3:N/A] Debridement Treatment Response: [1:1x1.5x0.1] [2:0.5x0.5x0.2] [3:N/A] Post Debridement Measurements L x W x D (cm) [1:0.118] [2:0.039] [3:N/A] Post Debridement Volume: (cm) [1:N/A] [2:N/A] [3:N/A] Post Debridement Stage: [1:Debridement] [2:Debridement] [3:N/A] Wound Number: 4 N/A N/A Photos: No Photos N/A N/A Left Calcaneus N/A N/A Wound Location: Pressure Injury N/A N/A Wounding Event: Pressure Ulcer N/A N/A Primary Etiology: Glaucoma, Chronic Obstructive N/A N/A Comorbid History: Pulmonary Disease (COPD), Arrhythmia, Congestive Heart Failure, Hypertension, Osteoarthritis, Received Chemotherapy 03/01/2021 N/A N/A Date Acquired: 0 N/A N/A Weeks of Treatment: Open N/A N/A Wound Status: 1x0.5x0.1 N/A N/A Measurements L x W x D (cm) 0.393 N/A N/A A (cm) : rea 0.039 N/A N/A Volume (cm) : N/A N/A N/A % Reduction in A rea: N/A N/A N/A % Reduction in Volume: Category/Stage III N/A N/A Classification: Medium N/A N/A Exudate A mount: Serosanguineous N/A N/A Exudate Type: red, brown N/A N/A Exudate Color: Flat and Intact N/A N/A Wound Margin: Large (67-100%) N/A N/A Granulation A mount: Pink N/A N/A Granulation Quality: Small (1-33%) N/A N/A Necrotic A mount: Fat Layer (Subcutaneous Tissue): Yes N/A  N/A Exposed Structures: Fascia: No Tendon: No Muscle: No Joint: No Bone: No None N/A N/A Epithelialization: Chemical/Enzymatic/Mechanical N/A N/A Debridement: Pre-procedure Verification/Time Out 15:40 N/A N/A Taken: N/A N/A N/A Instrument: Moderate N/A N/A Bleeding: Procedure was tolerated well N/A N/A Debridement Treatment Response: 1x0.5x0.1 N/A N/A Post Debridement Measurements L x W x D (cm) 0.039 N/A N/A Post Debridement Volume: (cm) Category/Stage III N/A N/A Post Debridement Stage: Debridement N/A N/A Procedures Performed: Treatment Notes Electronic Signature(s) Signed: 03/01/2021 4:56:05 PM By: Levan Hurst RN, BSN Signed: 03/01/2021 4:58:19 PM By: Linton Ham MD Entered By: Linton Ham on 03/01/2021 16:10:05 -------------------------------------------------------------------------------- Multi-Disciplinary Care Plan Details Patient Name: Date of Service: [name] RBA RA S. 03/01/2021 2:30 PM Medical Record Number: [id number] Patient Account Number: [id number] Date of Birth/Sex: Treating RN: Aug 26, 1935 (86 y.o. Nancy Fetter Primary Care Abiel Antrim: [name] Other Clinician: Referring Jedaiah Rathbun: Treating Marleen Moret/Extender: Pauletta Browns in Treatment: 0 Active Inactive Orientation to the Wound Care Program Nursing Diagnoses: Knowledge deficit related to the wound healing center program Goals: Patient/caregiver will verbalize understanding of the [place] Date Initiated: 02/23/2021 Target Resolution Date: 03/17/2021 Goal Status: Active Interventions: Provide education on orientation to the wound center Notes: Pain, Acute or Chronic Nursing Diagnoses: Pain, acute or  chronic: actual or potential Potential alteration in comfort, pain Goals: Patient will verbalize adequate pain control and receive pain control interventions during procedures as needed Date Initiated: 02/23/2021 Target  Resolution Date: 03/17/2021 Goal Status: Active Patient/caregiver will verbalize adequate pain control between visits Date Initiated: 02/23/2021 Target Resolution Date: 03/17/2021 Goal Status: Active Patient/caregiver will verbalize comfort level met Date Initiated: 02/23/2021 Target Resolution Date: 03/17/2021 Goal Status: Active Interventions: Encourage patient to take pain medications as prescribed Provide education on pain management Reposition patient for comfort Treatment Activities: Administer pain control measures as ordered : 02/23/2021 Notes: Wound/Skin Impairment Nursing Diagnoses: Knowledge deficit related to ulceration/compromised skin integrity Goals: Patient/caregiver will verbalize understanding of skin care regimen Date Initiated: 02/23/2021 Target Resolution Date: 03/17/2021 Goal Status: Active Interventions: Assess patient/caregiver ability to perform ulcer/skin care regimen upon admission and as needed Assess ulceration(s) every visit Provide education on ulcer and skin care Treatment Activities: Skin care regimen initiated : 02/23/2021 Topical wound management initiated : 02/23/2021 Notes: Electronic Signature(s) Signed: 03/01/2021 4:56:05 PM By: Levan Hurst RN, BSN Entered By: Levan Hurst on 03/01/2021 15:27:18 -------------------------------------------------------------------------------- Pain Assessment Details Patient Name: Date of Service: [name] RA S. 03/01/2021 2:30 PM Medical Record Number: [id number] Patient Account Number: [id number] Date of Birth/Sex: Treating RN: [birth year] (86 y.o. Nancy Fetter Primary Care Denea Cheaney: [name] Other Clinician: Referring Yazmen Briones: Treating Angy Swearengin/Extender: Pauletta Browns in Treatment: 0 Active Problems Location of Pain Severity and Description of Pain Patient Has Paino No Site Locations Pain Management and Medication Current Pain Management: Electronic  Signature(s) Signed: 03/01/2021 4:56:05 PM By: Levan Hurst RN, BSN Entered By: Levan Hurst on 03/01/2021 15:06:43 -------------------------------------------------------------------------------- Patient/Caregiver Education Details Patient Name: Date of Service: [name] 6/29/2022andnbsp2:30 PM Medical Record Number: [id number] Patient Account Number: [id number] Date of Birth/Gender: Treating RN: 12-01-1934 (86 y.o. Nancy Fetter Primary Care Physician: [name] Other Clinician: Referring Physician: Treating Physician/Extender: Pauletta Browns in Treatment: 0 Education Assessment Education Provided To: Patient Education Topics Provided Wound/Skin Impairment: Methods: Explain/Verbal Responses: State content correctly Electronic Signature(s) Signed: 03/01/2021 4:56:05 PM By: Levan Hurst RN, BSN Entered By: Levan Hurst on 03/01/2021 15:27:44 -------------------------------------------------------------------------------- Wound Assessment Details Patient Name: Date of Service: [name] RA S. 03/01/2021 2:30 PM Medical Record Number: [id number] Patient Account Number: [id number] Date of Birth/Sex: Treating RN: March 21, 1935 (86 y.o. Nancy Fetter Primary Care Cresta Riden: [name] Other Clinician: Referring Kayo Zion: Treating Ilana Prezioso/Extender: Pauletta Browns in Treatment: 0 Wound Status Wound Number: 1 Primary Arterial Insufficiency Ulcer Etiology: Wound Location: Left, Medial Ankle Wound Open Wounding Event: Other Lesion Status: Date Acquired: 12/12/2020 Comorbid Glaucoma, Chronic Obstructive Pulmonary Disease (COPD), Weeks Of Treatment: 0 History: Arrhythmia, Congestive Heart Failure, Hypertension, Osteoarthritis, Clustered Wound: No Received Chemotherapy Photos Wound Measurements Length: (cm) 1 Width: (cm) 1.5 Depth: (cm) 0.1 Area: (cm) 1.178 Volume: (cm) 0.118 % Reduction in  Area: -78.5% % Reduction in Volume: 10.6% Epithelialization: None Tunneling: No Undermining: No Wound Description Classification: Full Thickness Without Exposed Support Structures Wound Margin: Distinct, outline attached Exudate Amount: Medium Exudate Type: Serosanguineous Exudate Color: red, brown Wound Bed Granulation Amount: Small (1-33%) Granulation Quality: Pink Necrotic Amount: Large (67-100%) Necrotic Quality: Adherent Slough Foul Odor After Cleansing: No Slough/Fibrino Yes Exposed Structure Fascia Exposed: No Fat Layer (Subcutaneous Tissue) Exposed: Yes Tendon Exposed: No Muscle Exposed: No Joint Exposed: No Bone Exposed: No Treatment Notes Wound #1 (Ankle) Wound Laterality: Left, Medial Cleanser Soap and Water Discharge Instruction: May shower and  wash wound with dial antibacterial soap and water prior to dressing change. Peri-Wound Care Skin Prep Discharge Instruction: Use skin prep as directed Topical Primary Dressing Santyl Ointment Discharge Instruction: Apply nickel thick amount to wound bed as instructed Secondary Dressing Bordered Gauze, 2x3.75 in Discharge Instruction: Apply over primary dressing as directed. Secured With Compression Wrap Compression Stockings Environmental education officer) Signed: 03/01/2021 4:56:05 PM By: Levan Hurst RN, BSN Signed: 03/01/2021 5:02:46 PM By: Sandre Kitty Entered By: Sandre Kitty on 03/01/2021 16:41:15 -------------------------------------------------------------------------------- Wound Assessment Details Patient Name: Date of Service: [name] RA S. 03/01/2021 2:30 PM Medical Record Number: [id number] Patient Account Number: [id number] Date of Birth/Sex: Treating RN: 30-Oct-1934 (86 y.o. Nancy Fetter Primary Care Marston Mccadden: [name] Other Clinician: Referring Nikesh Teschner: Treating Savina Olshefski/Extender: Pauletta Browns in Treatment: 0 Wound Status Wound Number: 2  Primary Arterial Insufficiency Ulcer Etiology: Wound Location: Right, Medial Foot Wound Open Wounding Event: Other Lesion Status: Date Acquired: 02/06/2021 Comorbid Glaucoma, Chronic Obstructive Pulmonary Disease (COPD), Weeks Of Treatment: 0 History: Arrhythmia, Congestive Heart Failure, Hypertension, Osteoarthritis, Clustered Wound: No Received Chemotherapy Photos Wound Measurements Length: (cm) 0.5 Width: (cm) 0.5 Depth: (cm) 0.2 Area: (cm) 0.196 Volume: (cm) 0.039 % Reduction in Area: -108.5% % Reduction in Volume: -105.3% Epithelialization: None Tunneling: No Undermining: No Wound Description Classification: Full Thickness Without Exposed Support Structures Wound Margin: Distinct, outline attached Exudate Amount: Medium Exudate Type: Serosanguineous Exudate Color: red, brown Foul Odor After Cleansing: No Slough/Fibrino Yes Wound Bed Granulation Amount: Medium (34-66%) Exposed Structure Granulation Quality: Pink, Pale Fascia Exposed: No Necrotic Amount: Medium (34-66%) Fat Layer (Subcutaneous Tissue) Exposed: Yes Necrotic Quality: Adherent Slough Tendon Exposed: No Muscle Exposed: No Joint Exposed: No Bone Exposed: No Treatment Notes Wound #2 (Foot) Wound Laterality: Right, Medial Cleanser Soap and Water Discharge Instruction: May shower and wash wound with dial antibacterial soap and water prior to dressing change. Peri-Wound Care Skin Prep Discharge Instruction: Use skin prep as directed Topical Primary Dressing Santyl Ointment Discharge Instruction: Apply nickel thick amount to wound bed as instructed Secondary Dressing Bordered Gauze, 2x3.75 in Discharge Instruction: Apply over primary dressing as directed. Secured With Compression Wrap Compression Stockings Environmental education officer) Signed: 03/01/2021 4:56:05 PM By: Levan Hurst RN, BSN Signed: 03/01/2021 5:02:46 PM By: Sandre Kitty Entered By: Sandre Kitty on 03/01/2021  16:40:54 -------------------------------------------------------------------------------- Wound Assessment Details Patient Name: Date of Service: [name] RA S. 03/01/2021 2:30 PM Medical Record Number: [id number] Patient Account Number: [id number] Date of Birth/Sex: Treating RN: [birth year] (86 y.o. Nancy Fetter Primary Care Prerna Harold: [name] Other Clinician: Referring Nadiyah Zeis: Treating Dayzha Pogosyan/Extender: Pauletta Browns in Treatment: 0 Wound Status Wound Number: 3 Primary Arterial Insufficiency Ulcer Etiology: Wound Location: Right T Fifth oe Wound Open Wounding Event: Other Lesion Status: Date Acquired: 02/06/2021 Comorbid Glaucoma, Chronic Obstructive Pulmonary Disease (COPD), Weeks Of Treatment: 0 History: Arrhythmia, Congestive Heart Failure, Hypertension, Osteoarthritis, Clustered Wound: No Received Chemotherapy Photos Wound Measurements Length: (cm) 0.2 Width: (cm) 0.2 Depth: (cm) 0.1 Area: (cm) 0.031 Volume: (cm) 0.003 % Reduction in Area: -93.7% % Reduction in Volume: -50% Epithelialization: None Tunneling: No Undermining: No Wound Description Classification: Full Thickness Without Exposed Support Structures Wound Margin: Distinct, outline attached Exudate Amount: Medium Exudate Type: Serosanguineous Exudate Color: red, brown Foul Odor After Cleansing: No Slough/Fibrino No Wound Bed Granulation Amount: Large (67-100%) Exposed Structure Granulation Quality: Pink Fascia Exposed: No Necrotic Amount: None Present (0%) Fat Layer (Subcutaneous Tissue) Exposed: Yes Tendon Exposed: No Muscle Exposed: No Joint  Exposed: No Bone Exposed: No Treatment Notes Wound #3 (Toe Fifth) Wound Laterality: Right Cleanser Soap and Water Discharge Instruction: May shower and wash wound with dial antibacterial soap and water prior to dressing change. Peri-Wound Care Topical Primary Dressing KerraCel Ag Gelling Fiber Dressing, 2x2  in (silver alginate) Discharge Instruction: Apply silver alginate to wound bed as instructed Secondary Dressing Woven Gauze Sponges 2x2 in Discharge Instruction: Apply over primary dressing as directed. Secured With Paper Tape, 1x10 (in/yd) Discharge Instruction: Secure dressing with tape as directed. Compression Wrap Compression Stockings Add-Ons Electronic Signature(s) Signed: 03/01/2021 4:56:05 PM By: Levan Hurst RN, BSN Signed: 03/01/2021 5:02:46 PM By: Sandre Kitty Entered By: Sandre Kitty on 03/01/2021 16:43:38 -------------------------------------------------------------------------------- Wound Assessment Details Patient Name: Date of Service: [name] RA S. 03/01/2021 2:30 PM Medical Record Number: [id number] Patient Account Number: [id number] Date of Birth/Sex: Treating RN: [birth year] (86 y.o. Nancy Fetter Primary Care Laurent Cargile: [name] Other Clinician: Referring Visente Kirker: Treating Zacchary Pompei/Extender: Pauletta Browns in Treatment: 0 Wound Status Wound Number: 4 Primary Pressure Ulcer Etiology: Wound Location: Left Calcaneus Wound Open Wounding Event: Pressure Injury Status: Date Acquired: 03/01/2021 Comorbid Glaucoma, Chronic Obstructive Pulmonary Disease (COPD), Weeks Of Treatment: 0 History: Arrhythmia, Congestive Heart Failure, Hypertension, Osteoarthritis, Clustered Wound: No Received Chemotherapy Photos Wound Measurements Length: (cm) 1 Width: (cm) 0.5 Depth: (cm) 0.1 Area: (cm) 0.393 Volume: (cm) 0.039 % Reduction in Area: 0% % Reduction in Volume: 0% Epithelialization: None Tunneling: No Undermining: No Wound Description Classification: Category/Stage III Wound Margin: Flat and Intact Exudate Amount: Medium Exudate Type: Serosanguineous Exudate Color: red, brown Foul Odor After Cleansing: No Slough/Fibrino Yes Wound Bed Granulation Amount: Large (67-100%) Exposed Structure Granulation  Quality: Pink Fascia Exposed: No Necrotic Amount: Small (1-33%) Fat Layer (Subcutaneous Tissue) Exposed: Yes Necrotic Quality: Adherent Slough Tendon Exposed: No Muscle Exposed: No Joint Exposed: No Bone Exposed: No Treatment Notes Wound #4 (Calcaneus) Wound Laterality: Left Cleanser Soap and Water Discharge Instruction: May shower and wash wound with dial antibacterial soap and water prior to dressing change. Peri-Wound Care Skin Prep Discharge Instruction: Use skin prep as directed Topical Primary Dressing Santyl Ointment Discharge Instruction: Apply nickel thick amount to wound bed as instructed Secondary Dressing Bordered Gauze, 2x3.75 in Discharge Instruction: Apply over primary dressing as directed. Secured With Compression Wrap Compression Stockings Environmental education officer) Signed: 03/01/2021 4:56:05 PM By: Levan Hurst RN, BSN Signed: 03/01/2021 5:02:46 PM By: Sandre Kitty Entered By: Sandre Kitty on 03/01/2021 16:43:10 -------------------------------------------------------------------------------- Vitals Details Patient Name: Date of Service: [name] RBA RA S. 03/01/2021 2:30 PM Medical Record Number: [id number] Patient Account Number: [id number] Date of Birth/Sex: Treating RN: [birth year] (86 y.o. Nancy Fetter Primary Care Tamieka Rancourt: [name] Other Clinician: Referring Zoe Goonan: Treating Malavika Lira/Extender: Pauletta Browns in Treatment: 0 Vital Signs Time Taken: 15:06 Temperature (F): 97.5 Pulse (bpm): 77 Respiratory Rate (breaths/min): 22 Blood Pressure (mmHg): 84/52 Reference Range: 80 - 120 mg / dl Electronic Signature(s) Signed: 03/01/2021 4:56:05 PM By: Levan Hurst RN, BSN Entered By: Levan Hurst on 03/01/2021 15:06:35

## 2021-03-08 ENCOUNTER — Other Ambulatory Visit: Payer: Self-pay

## 2021-03-08 ENCOUNTER — Encounter (HOSPITAL_BASED_OUTPATIENT_CLINIC_OR_DEPARTMENT_OTHER): Payer: Medicare Other | Admitting: Internal Medicine

## 2021-03-08 ENCOUNTER — Encounter (HOSPITAL_BASED_OUTPATIENT_CLINIC_OR_DEPARTMENT_OTHER): Payer: Medicare Other | Attending: Internal Medicine | Admitting: Internal Medicine

## 2021-03-08 DIAGNOSIS — L97518 Non-pressure chronic ulcer of other part of right foot with other specified severity: Secondary | ICD-10-CM | POA: Diagnosis not present

## 2021-03-08 DIAGNOSIS — L97328 Non-pressure chronic ulcer of left ankle with other specified severity: Secondary | ICD-10-CM | POA: Diagnosis not present

## 2021-03-08 DIAGNOSIS — I110 Hypertensive heart disease with heart failure: Secondary | ICD-10-CM | POA: Insufficient documentation

## 2021-03-08 DIAGNOSIS — L89620 Pressure ulcer of left heel, unstageable: Secondary | ICD-10-CM | POA: Diagnosis not present

## 2021-03-08 DIAGNOSIS — L988 Other specified disorders of the skin and subcutaneous tissue: Secondary | ICD-10-CM | POA: Diagnosis not present

## 2021-03-08 DIAGNOSIS — L97512 Non-pressure chronic ulcer of other part of right foot with fat layer exposed: Secondary | ICD-10-CM | POA: Diagnosis not present

## 2021-03-08 DIAGNOSIS — I509 Heart failure, unspecified: Secondary | ICD-10-CM | POA: Insufficient documentation

## 2021-03-08 DIAGNOSIS — L97322 Non-pressure chronic ulcer of left ankle with fat layer exposed: Secondary | ICD-10-CM | POA: Diagnosis not present

## 2021-03-08 NOTE — Progress Notes (Addendum)
[name], [name] ([id number]) Visit Report for 03/08/2021 Arrival Information Details Patient Name: Date of Service: [name], [name] [facility] RA S. 03/08/2021 1:30 PM Medical Record Number: [id number] Patient Account Number: [id number] Date of Birth/Sex: Treating RN: Sep 10, 1934 (86 y.o. Nancy Fetter Primary Care Isma Tietje: [name] Other Clinician: Referring Glenola Wheat: Treating Brittin Janik/Extender: Pauletta Browns in Treatment: 1 Visit Information History Since Last Visit Added or deleted any medications: No Patient Arrived: Wheel Chair Any new allergies or adverse reactions: No Arrival Time: 13:44 Had a fall or experienced change in No Accompanied By: daughter activities of daily living that may affect Transfer Assistance: None risk of falls: Patient Identification Verified: Yes Signs or symptoms of abuse/neglect since last visito No Secondary Verification Process Completed: Yes Hospitalized since last visit: No Patient Requires Transmission-Based Precautions: No Implantable device outside of the clinic excluding No Patient Has Alerts: No cellular tissue based products placed in the center since last visit: Has Dressing in Place as Prescribed: Yes Has Compression in Place as Prescribed: Yes Pain Present Now: Yes Electronic Signature(s) Signed: 03/08/2021 5:22:20 PM By: Sandre Kitty Entered By: Sandre Kitty on 03/08/2021 13:46:45 -------------------------------------------------------------------------------- Lower Extremity Assessment Details Patient Name: Date of Service: [name] RA S. 03/08/2021 1:30 PM Medical Record Number: [id number] Patient Account Number: [id number] Date of Birth/Sex: Treating RN: 06-May-1935 (86 y.o. Helene Shoe, Tammi Klippel Primary Care Angad Nabers: [name] Other Clinician: Referring Zayvien Canning: Treating Maansi Wike/Extender: Pauletta Browns in Treatment: 1 Edema Assessment Assessed: Shirlyn Goltz: Yes] Patrice Paradise:  Yes] Edema: [Left: Yes] [Right: Yes] Calf Left: Right: Point of Measurement: 30 cm From Medial Instep 31.5 cm 31.5 cm Ankle Left: Right: Point of Measurement: 9 cm From Medial Instep 20 cm 20 cm Electronic Signature(s) Signed: 03/08/2021 6:50:03 PM By: Deon Pilling Entered By: Deon Pilling on 03/08/2021 14:19:09 -------------------------------------------------------------------------------- Multi Wound Chart Details Patient Name: Date of Service: [name] RBA RA S. 03/08/2021 1:30 PM Medical Record Number: [id number] Patient Account Number: [id number] Date of Birth/Sex: Treating RN: July 06, 1935 (86 y.o. Nancy Fetter Primary Care Jacklynn Dehaas: [name] Other Clinician: Referring Demarkis Gheen: Treating Beonca Gibb/Extender: Pauletta Browns in Treatment: 1 Vital Signs Height(in): Pulse(bpm): 67 Weight(lbs): Blood Pressure(mmHg): 101/69 Body Mass Index(BMI): Temperature(F): 97.7 Respiratory Rate(breaths/min): 62 Photos: [1:No Photos Left, Medial Ankle] [2:No Photos Right, Medial Foot] [3:No Photos Right T Fifth oe] Wound Location: [1:Other Lesion] [2:Other Lesion] [3:Other Lesion] Wounding Event: [1:Arterial Insufficiency Ulcer] [2:Arterial Insufficiency Ulcer] [3:Arterial Insufficiency Ulcer] Primary Etiology: [1:Glaucoma, Chronic Obstructive] [2:Glaucoma, Chronic Obstructive] [3:Glaucoma, Chronic Obstructive] Comorbid History: [1:Pulmonary Disease (COPD), Arrhythmia, Congestive Heart Failure, Arrhythmia, Congestive Heart Failure, Arrhythmia, Congestive Heart Failure, Hypertension, Osteoarthritis, ReceivedHypertension, Osteoarthritis, ReceivedHypertension,  Osteoarthritis, Received Chemotherapy 12/12/2020] [2:Pulmonary Disease (COPD), Chemotherapy 02/06/2021] [3:Pulmonary Disease (COPD), Chemotherapy 02/06/2021] Date Acquired: [1:1] [2:1] [3:1] Weeks of Treatment: [1:Open] [2:Open] [3:Open] Wound Status: [1:1x1.4x0.1] [2:0.5x0.3x0.1]  [3:0.3x0.3x0.1] Measurements L x W x D (cm) [1:1.1] [2:0.118] [3:0.071] A (cm) : rea [1:0.11] [2:0.012] [3:0.007] Volume (cm) : [1:-66.70%] [2:-25.50%] [3:-343.70%] % Reduction in Area: [1:16.70%] [2:36.80%] [3:-250.00%] % Reduction in Volume: [1:Full Thickness Without Exposed] [2:Full Thickness Without Exposed] [3:Full Thickness Without Exposed] Classification: [1:Support Structures Medium] [2:Support Structures Medium] [3:Support Structures Medium] Exudate Amount: [1:Serosanguineous] [2:Serosanguineous] [3:Serosanguineous] Exudate Type: [1:red, brown] [2:red, brown] [3:red, brown] Exudate Color: [1:Distinct, outline attached] [2:Distinct, outline attached] [3:Distinct, outline attached] Wound Margin: [1:Small (1-33%)] [2:Medium (34-66%)] [3:Large (67-100%)] Granulation Amount: [1:Pink] [2:Pink, Pale] [3:Pink] Granulation Quality: [1:Large (67-100%)] [2:Medium (34-66%)] [3:None Present (0%)] Necrotic Amount: [1:Fat Layer (Subcutaneous Tissue): Yes Fat Layer (Subcutaneous Tissue):  Yes Fat Layer (Subcutaneous Tissue): Yes] Exposed Structures: [1:Fascia: No Tendon: No Muscle: No Joint: No Bone: No None] [2:Fascia: No Tendon: No Muscle: No Joint: No Bone: No None] [3:Fascia: No Tendon: No Muscle: No Joint: No Bone: No None] Wound Number: 4 5 N/A Photos: No Photos No Photos N/A Left Calcaneus Left, Plantar T Great oe N/A Wound Location: Pressure Injury Gradually Appeared N/A Wounding Event: Pressure Ulcer Arterial Insufficiency Ulcer N/A Primary Etiology: Glaucoma, Chronic Obstructive Glaucoma, Chronic Obstructive N/A Comorbid History: Pulmonary Disease (COPD), Pulmonary Disease (COPD), Arrhythmia, Congestive Heart Failure, Arrhythmia, Congestive Heart Failure, Hypertension, Osteoarthritis, ReceivedHypertension, Osteoarthritis, Received Chemotherapy Chemotherapy 03/01/2021 03/08/2021 N/A Date Acquired: 1 0 N/A Weeks of Treatment: Open Open N/A Wound Status: 1x0.5x0.1 0.6x0.5x0.2  N/A Measurements L x W x D (cm) 0.393 0.236 N/A A (cm) : rea 0.039 0.047 N/A Volume (cm) : 0.00% 0.00% N/A % Reduction in Area: 0.00% 0.00% N/A % Reduction in Volume: Category/Stage III Full Thickness Without Exposed N/A Classification: Support Structures Medium Medium N/A Exudate Amount: Serosanguineous Serosanguineous N/A Exudate Type: red, brown red, brown N/A Exudate Color: Flat and Intact Distinct, outline attached N/A Wound Margin: Medium (34-66%) Small (1-33%) N/A Granulation Amount: Red, Pink Red, Pink N/A Granulation Quality: Medium (34-66%) Large (67-100%) N/A Necrotic Amount: Fat Layer (Subcutaneous Tissue): Yes Fat Layer (Subcutaneous Tissue): Yes N/A Exposed Structures: Fascia: No Fascia: No Tendon: No Tendon: No Muscle: No Muscle: No Joint: No Joint: No Bone: No Bone: No None Small (1-33%) N/A Epithelialization: Treatment Notes Electronic Signature(s) Signed: 03/08/2021 5:13:41 PM By: Linton Ham MD Signed: 03/08/2021 7:11:13 PM By: Levan Hurst RN, BSN Entered By: Linton Ham on 03/08/2021 14:56:10 -------------------------------------------------------------------------------- Multi-Disciplinary Care Plan Details Patient Name: Date of Service: [name] RBA RA S. 03/08/2021 1:30 PM Medical Record Number: [id number] Patient Account Number: [id number] Date of Birth/Sex: Treating RN: June 24, 1935 (86 y.o. Nancy Fetter Primary Care Labarron Durnin: [name] Other Clinician: Referring Ahnna Dungan: Treating Danzig Macgregor/Extender: Pauletta Browns in Treatment: 1 Active Inactive Electronic Signature(s) Signed: 06/01/2021 4:51:27 PM By: Levan Hurst RN, BSN Previous Signature: 03/08/2021 7:11:13 PM Version By: Levan Hurst RN, BSN Entered By: Levan Hurst on 04/10/2021 11:57:38 -------------------------------------------------------------------------------- Pain Assessment Details Patient Name: Date of  Service: [name] RBA RA S. 03/08/2021 1:30 PM Medical Record Number: [id number] Patient Account Number: [id number] Date of Birth/Sex: Treating RN: 11/10/1934 (86 y.o. Nancy Fetter Primary Care Tricia Oaxaca: [name] Other Clinician: Referring Jaylaa Gallion: Treating Ziyana Morikawa/Extender: Pauletta Browns in Treatment: 1 Active Problems Location of Pain Severity and Description of Pain Patient Has Paino Yes Site Locations Rate the pain. Rate the pain. Current Pain Level: 5 Pain Management and Medication Current Pain Management: Electronic Signature(s) Signed: 03/08/2021 5:22:20 PM By: Sandre Kitty Signed: 03/08/2021 7:11:13 PM By: Levan Hurst RN, BSN Entered By: Sandre Kitty on 03/08/2021 13:46:58 -------------------------------------------------------------------------------- Patient/Caregiver Education Details Patient Name: Date of Service: Eliott Nine 7/6/2022andnbsp1:30 PM Medical Record Number: [id number] Patient Account Number: [id number] Date of Birth/Gender: Treating RN: 29-Oct-1934 (86 y.o. Nancy Fetter Primary Care Physician: [name] Other Clinician: Referring Physician: Treating Physician/Extender: Pauletta Browns in Treatment: 1 Education Assessment Education Provided To: Patient Education Topics Provided Wound/Skin Impairment: Methods: Explain/Verbal Responses: State content correctly Electronic Signature(s) Signed: 03/08/2021 7:11:13 PM By: Levan Hurst RN, BSN Entered By: Levan Hurst on 03/08/2021 18:28:30 -------------------------------------------------------------------------------- Wound Assessment Details Patient Name: Date of Service: [name] RA S. 03/08/2021 1:30 PM Medical Record Number: [id number] Patient Account Number: [id number] Date  of Birth/Sex: Treating RN: 23-Sep-1934 (86 y.o. Nancy Fetter Primary Care Miyako Oelke: [name] Other Clinician: Referring  Pasha Broad: Treating Aking Klabunde/Extender: Pauletta Browns in Treatment: 1 Wound Status Wound Number: 1 Primary Arterial Insufficiency Ulcer Etiology: Wound Location: Left, Medial Ankle Wound Open Wounding Event: Other Lesion Status: Date Acquired: 12/12/2020 Comorbid Glaucoma, Chronic Obstructive Pulmonary Disease (COPD), Weeks Of Treatment: 1 History: Arrhythmia, Congestive Heart Failure, Hypertension, Osteoarthritis, Clustered Wound: No Received Chemotherapy Photos Photo Uploaded By: Sandre Kitty on 03/08/2021 17:04:18 Wound Measurements Length: (cm) 1 Width: (cm) 1.4 Depth: (cm) 0.1 Area: (cm) 1.1 Volume: (cm) 0.11 % Reduction in Area: -66.7% % Reduction in Volume: 16.7% Epithelialization: None Tunneling: No Undermining: No Wound Description Classification: Full Thickness Without Exposed Support Structures Wound Margin: Distinct, outline attached Exudate Amount: Medium Exudate Type: Serosanguineous Exudate Color: red, brown Foul Odor After Cleansing: No Slough/Fibrino Yes Wound Bed Granulation Amount: Small (1-33%) Exposed Structure Granulation Quality: Pink Fascia Exposed: No Necrotic Amount: Large (67-100%) Fat Layer (Subcutaneous Tissue) Exposed: Yes Necrotic Quality: Adherent Slough Tendon Exposed: No Muscle Exposed: No Joint Exposed: No Bone Exposed: No Electronic Signature(s) Signed: 03/08/2021 6:50:03 PM By: Deon Pilling Signed: 03/08/2021 7:11:13 PM By: Levan Hurst RN, BSN Entered By: Deon Pilling on 03/08/2021 14:19:27 -------------------------------------------------------------------------------- Wound Assessment Details Patient Name: Date of Service: [name] RBA RA S. 03/08/2021 1:30 PM Medical Record Number: [id number] Patient Account Number: [id number] Date of Birth/Sex: Treating RN: April 22, 1935 (86 y.o. Nancy Fetter Primary Care Aneliese Beaudry: [name] Other Clinician: Referring Lorenso Quirino: Treating  Bellagrace Sylvan/Extender: Pauletta Browns in Treatment: 1 Wound Status Wound Number: 2 Primary Arterial Insufficiency Ulcer Etiology: Wound Location: Right, Medial Foot Wound Open Wounding Event: Other Lesion Status: Date Acquired: 02/06/2021 Comorbid Glaucoma, Chronic Obstructive Pulmonary Disease (COPD), Weeks Of Treatment: 1 History: Arrhythmia, Congestive Heart Failure, Hypertension, Osteoarthritis, Clustered Wound: No Received Chemotherapy Photos Photo Uploaded By: Sandre Kitty on 03/08/2021 17:03:55 Wound Measurements Length: (cm) 0.5 Width: (cm) 0.3 Depth: (cm) 0.1 Area: (cm) 0.118 Volume: (cm) 0.012 % Reduction in Area: -25.5% % Reduction in Volume: 36.8% Epithelialization: None Tunneling: No Undermining: No Wound Description Classification: Full Thickness Without Exposed Support Structures Wound Margin: Distinct, outline attached Exudate Amount: Medium Exudate Type: Serosanguineous Exudate Color: red, brown Foul Odor After Cleansing: No Slough/Fibrino Yes Wound Bed Granulation Amount: Medium (34-66%) Exposed Structure Granulation Quality: Pink, Pale Fascia Exposed: No Necrotic Amount: Medium (34-66%) Fat Layer (Subcutaneous Tissue) Exposed: Yes Necrotic Quality: Adherent Slough Tendon Exposed: No Muscle Exposed: No Joint Exposed: No Bone Exposed: No Electronic Signature(s) Signed: 03/08/2021 6:50:03 PM By: Deon Pilling Signed: 03/08/2021 7:11:13 PM By: Levan Hurst RN, BSN Entered By: Deon Pilling on 03/08/2021 14:19:41 -------------------------------------------------------------------------------- Wound Assessment Details Patient Name: Date of Service: [name] RBA RA S. 03/08/2021 1:30 PM Medical Record Number: [id number] Patient Account Number: [id number] Date of Birth/Sex: Treating RN: 06-26-1935 (86 y.o. Nancy Fetter Primary Care Remy Voiles: [name] Other Clinician: Referring [name]: Treating Joeanthony Seeling/Extender:  Pauletta Browns in Treatment: 1 Wound Status Wound Number: 3 Primary Arterial Insufficiency Ulcer Etiology: Wound Location: Right T Fifth oe Wound Open Wounding Event: Other Lesion Status: Date Acquired: 02/06/2021 Comorbid Glaucoma, Chronic Obstructive Pulmonary Disease (COPD), Weeks Of Treatment: 1 History: Arrhythmia, Congestive Heart Failure, Hypertension, Osteoarthritis, Clustered Wound: No Received Chemotherapy Photos Photo Uploaded By: Sandre Kitty on 03/08/2021 17:03:55 Wound Measurements Length: (cm) 0.3 Width: (cm) 0.3 Depth: (cm) 0.1 Area: (cm) 0.071 Volume: (cm) 0.007 % Reduction in Area: -343.7% % Reduction in Volume: -250%  Epithelialization: None Tunneling: No Undermining: No Wound Description Classification: Full Thickness Without Exposed Support Structures Wound Margin: Distinct, outline attached Exudate Amount: Medium Exudate Type: Serosanguineous Exudate Color: red, brown Foul Odor After Cleansing: No Slough/Fibrino No Wound Bed Granulation Amount: Large (67-100%) Exposed Structure Granulation Quality: Pink Fascia Exposed: No Necrotic Amount: None Present (0%) Fat Layer (Subcutaneous Tissue) Exposed: Yes Tendon Exposed: No Muscle Exposed: No Joint Exposed: No Bone Exposed: No Electronic Signature(s) Signed: 03/08/2021 6:50:03 PM By: Deon Pilling Signed: 03/08/2021 7:11:13 PM By: Levan Hurst RN, BSN Entered By: Deon Pilling on 03/08/2021 14:19:55 -------------------------------------------------------------------------------- Wound Assessment Details Patient Name: Date of Service: [name] RBA RA S. 03/08/2021 1:30 PM Medical Record Number: [id number] Patient Account Number: [id number] Date of Birth/Sex: Treating RN: 1935-07-14 (86 y.o. Nancy Fetter Primary Care Mikle Sternberg: [name] Other Clinician: Referring Allyssa Abruzzese: Treating Emilyrose Darrah/Extender: Pauletta Browns in Treatment:  1 Wound Status Wound Number: 4 Primary Pressure Ulcer Etiology: Wound Location: Left Calcaneus Wound Open Wounding Event: Pressure Injury Status: Date Acquired: 03/01/2021 Comorbid Glaucoma, Chronic Obstructive Pulmonary Disease (COPD), Weeks Of Treatment: 1 History: Arrhythmia, Congestive Heart Failure, Hypertension, Osteoarthritis, Clustered Wound: No Received Chemotherapy Photos Photo Uploaded By: Sandre Kitty on 03/08/2021 17:04:18 Wound Measurements Length: (cm) 1 Width: (cm) 0.5 Depth: (cm) 0.1 Area: (cm) 0.393 Volume: (cm) 0.039 % Reduction in Area: 0% % Reduction in Volume: 0% Epithelialization: None Tunneling: No Undermining: No Wound Description Classification: Category/Stage III Wound Margin: Flat and Intact Exudate Amount: Medium Exudate Type: Serosanguineous Exudate Color: red, brown Foul Odor After Cleansing: No Slough/Fibrino Yes Wound Bed Granulation Amount: Medium (34-66%) Exposed Structure Granulation Quality: Red, Pink Fascia Exposed: No Necrotic Amount: Medium (34-66%) Fat Layer (Subcutaneous Tissue) Exposed: Yes Necrotic Quality: Adherent Slough Tendon Exposed: No Muscle Exposed: No Joint Exposed: No Bone Exposed: No Electronic Signature(s) Signed: 03/08/2021 6:50:03 PM By: Deon Pilling Signed: 03/08/2021 7:11:13 PM By: Levan Hurst RN, BSN Entered By: Deon Pilling on 03/08/2021 14:20:20 -------------------------------------------------------------------------------- Wound Assessment Details Patient Name: Date of Service: [name] RBA RA S. 03/08/2021 1:30 PM Medical Record Number: [id number] Patient Account Number: [id number] Date of Birth/Sex: Treating RN: 11/17/1934 (86 y.o. Nancy Fetter Primary Care Lacresha Fusilier: [name] Other Clinician: Referring Laporsche Hoeger: Treating Don Giarrusso/Extender: Pauletta Browns in Treatment: 1 Wound Status Wound Number: 5 Primary Arterial Insufficiency  Ulcer Etiology: Wound Location: Left, Plantar T Great oe Wound Open Wounding Event: Gradually Appeared Status: Date Acquired: 03/08/2021 Comorbid Glaucoma, Chronic Obstructive Pulmonary Disease (COPD), Weeks Of Treatment: 0 History: Arrhythmia, Congestive Heart Failure, Hypertension, Osteoarthritis, Clustered Wound: No Received Chemotherapy Photos Photo Uploaded By: Sandre Kitty on 03/08/2021 17:04:27 Wound Measurements Length: (cm) 0.6 Width: (cm) 0.5 Depth: (cm) 0.2 Area: (cm) 0.236 Volume: (cm) 0.047 % Reduction in Area: 0% % Reduction in Volume: 0% Epithelialization: Small (1-33%) Tunneling: No Undermining: No Wound Description Classification: Full Thickness Without Exposed Support Structures Wound Margin: Distinct, outline attached Exudate Amount: Medium Exudate Type: Serosanguineous Exudate Color: red, brown Foul Odor After Cleansing: No Slough/Fibrino Yes Wound Bed Granulation Amount: Small (1-33%) Exposed Structure Granulation Quality: Red, Pink Fascia Exposed: No Necrotic Amount: Large (67-100%) Fat Layer (Subcutaneous Tissue) Exposed: Yes Necrotic Quality: Adherent Slough Tendon Exposed: No Muscle Exposed: No Joint Exposed: No Bone Exposed: No Electronic Signature(s) Signed: 03/08/2021 6:50:03 PM By: Deon Pilling Signed: 03/08/2021 7:11:13 PM By: Levan Hurst RN, BSN Entered By: Deon Pilling on 03/08/2021 14:21:16 -------------------------------------------------------------------------------- Vitals Details Patient Name: Date of Service: [name] RBA RA S. 03/08/2021 1:30 PM Medical Record  Number: [id number] Patient Account Number: [id number] Date of Birth/Sex: Treating RN: Sep 13, 1934 (86 y.o. Nancy Fetter Primary Care Jamiria Langill: [name] Other Clinician: Referring Logun Colavito: Treating Adalberto Metzgar/Extender: Pauletta Browns in Treatment: 1 Vital Signs Time Taken: 13:49 Temperature (F): 97.7 Pulse (bpm):  52 Respiratory Rate (breaths/min): 22 Blood Pressure (mmHg): 101/69 Reference Range: 80 - 120 mg / dl Electronic Signature(s) Signed: 03/08/2021 5:22:20 PM By: Sandre Kitty Entered By: Sandre Kitty on 03/08/2021 14:07:16

## 2021-03-09 NOTE — Progress Notes (Signed)
CARIGAN, LISTER ([id number]) Visit Report for 03/08/2021 Debridement Details Patient Name: Date of Service: [name], [name] [facility] RA S. 03/08/2021 1:30 PM Medical Record Number: [id number] Patient Account Number: [id number] Date of Birth/Sex: Treating RN: 05-07-1935 (86 y.o. [name] Primary Care Provider: Scarlette Dudley Other Clinician: Referring Provider: Treating Provider/Extender: [name] in Treatment: 1 Debridement Performed for Assessment: Wound #1 Left,Medial Ankle Performed By: Clinician Susan Hurst, RN Debridement Type: Chemical/Enzymatic/Mechanical Agent Used: Santyl Severity of Tissue Pre Debridement: Fat layer exposed Level of Consciousness (Pre-procedure): Awake and Alert Pre-procedure Verification/Time Out No Taken: Bleeding: None Response to Treatment: Procedure was tolerated well Level of Consciousness (Post- Awake and Alert procedure): Post Debridement Measurements of Total Wound Length: (cm) 1 Width: (cm) 1.4 Depth: (cm) 0.1 Volume: (cm) 0.11 Character of Wound/Ulcer Post Debridement: Requires Further Debridement Severity of Tissue Post Debridement: Fat layer exposed Post Procedure Diagnosis Same as Pre-procedure Electronic Signature(s) Signed: 03/08/2021 7:11:13 PM By: Susan Hurst RN, BSN Signed: 03/09/2021 8:35:52 PM By: Linton Ham MD Entered By: [name] on 03/08/2021 18:29:22 -------------------------------------------------------------------------------- Debridement Details Patient Name: Date of Service: [name] RBA RA S. 03/08/2021 1:30 PM Medical Record Number: [id number] Patient Account Number: [id number] Date of Birth/Sex: Treating RN: 10/23/1934 (86 y.o. [name] Primary Care Provider: Scarlette Dudley Other Clinician: Referring Provider: Treating Provider/Extender: [name] in Treatment: 1 Debridement Performed for Assessment: Wound #2 Right,Medial Foot Performed  By: Clinician Susan Hurst, RN Debridement Type: Chemical/Enzymatic/Mechanical Agent Used: Santyl Severity of Tissue Pre Debridement: Fat layer exposed Level of Consciousness (Pre-procedure): Awake and Alert Pre-procedure Verification/Time Out No Taken: Bleeding: None Response to Treatment: Procedure was tolerated well Level of Consciousness (Post- Awake and Alert Awake and Alert procedure): Post Debridement Measurements of Total Wound Length: (cm) 0.5 Width: (cm) 0.3 Depth: (cm) 0.1 Volume: (cm) 0.012 Character of Wound/Ulcer Post Debridement: Requires Further Debridement Severity of Tissue Post Debridement: Fat layer exposed Post Procedure Diagnosis Same as Pre-procedure Electronic Signature(s) Signed: 03/08/2021 7:11:13 PM By: Susan Hurst RN, BSN Signed: 03/09/2021 8:35:52 PM By: Linton Ham MD Entered By: [name] on 03/08/2021 18:29:46 -------------------------------------------------------------------------------- Debridement Details Patient Name: Date of Service: [name] RBA RA S. 03/08/2021 1:30 PM Medical Record Number: [id number] Patient Account Number: [id number] Date of Birth/Sex: Treating RN: 1935-06-15 (86 y.o. [name] Primary Care Provider: Scarlette Dudley Other Clinician: Referring Provider: Treating Provider/Extender: [name] in Treatment: 1 Debridement Performed for Assessment: Wound #4 Left Calcaneus Performed By: Clinician Susan Hurst, RN Debridement Type: Chemical/Enzymatic/Mechanical Agent Used: Santyl Level of Consciousness (Pre-procedure): Awake and Alert Pre-procedure Verification/Time Out No Taken: Bleeding: None Response to Treatment: Procedure was tolerated well Level of Consciousness (Post- Awake and Alert procedure): Post Debridement Measurements of Total Wound Length: (cm) 1 Stage: Category/Stage III Width: (cm) 0.5 Depth: (cm) 0.1 Volume: (cm) 0.039 Character of Wound/Ulcer  Post Debridement: Requires Further Debridement Post Procedure Diagnosis Same as Pre-procedure Electronic Signature(s) Signed: 03/08/2021 7:11:13 PM By: Susan Hurst RN, BSN Signed: 03/09/2021 8:35:52 PM By: Linton Ham MD Entered By: [name] on 03/08/2021 18:30:08 -------------------------------------------------------------------------------- HPI Details Patient Name: Date of Service: [name] RBA RA S. 03/08/2021 1:30 PM Medical Record Number: [id number] Patient Account Number: [id number] Date of Birth/Sex: Treating RN: 1934-10-07 (86 y.o. [name] Primary Care Provider: Other Clinician: Scarlette Dudley Referring Provider: Treating Provider/Extender: [name] in Treatment: 1 History of Present Illness HPI Description: ADMISSION 02/23/2021 [name] is an unfortunate 86 year old lady with  multiple medical issues. She is here accompanied by her daughter. She tells me that she was in the hospital for heart failure in April. She had a painful area over the left medial malleolus and a nurse put some form of foam cover on this. When she took the foam cover off when she got home after she was discharged she had a open wound which is very painful. She went to see Dr. Ronnald Ramp of dermatology who put some form of waterproof Band-Aid on this. This has not helped. She was seen by her oncologist who referred her here [Dr. Feng]. The patient states that she is in a lot of pain because of this it hurts at night. During our review by our intake nurse it was noted that she also had a small punched-out area on the right medial first MTP and an area on her dorsal right fifth toe. She does not give a claudication history in fact she says that her legs actually feel better when she is walking. Past medical history she has multiple serious past medical issues including metastatic breast cancer on an aromatase inhibitor. The cancer is metastatic, severe COPD on  oxygen, congestive heart failure. I reviewed her echocardiogram from 12/09/2020 she had normal ejection fraction but also noted to have severe apical hypertrophy/ASH. Her right ventricular systolic function was severely reduced with severely elevated pulmonary artery pressures. He also has a history of bladder cancer she has had double mastectomy she is on chronic oxygen. For the last year she has had very significant discoloration of her forefeet especially when they are dependent. This is clearly not a lifelong phenomenon ABIs were done in our clinic and surprisingly were quite good with an ABI of 1.06 on the right and 1.04 on the left 03/01/2021; the patient's arterial studies are not until 7/18; I am looking for diagnostics here recognizing that she will not be a candidate for even percutaneous interventions. She has cyanosis of her toes. She arrived in clinic today with a new area on her left Achilles heel and also her plantar left first toe. This is in addition to the right fifth toe dorsally, the right first met head medially and the left medial ankle 7/6; after discussion today the patient reiterated her reviews that she did not want to undergo arterial studies. She has areas on the left Achilles heel, plantar left first toe, right dorsal foot, right fifth toe. All of these look like ischemic wounds. We have been using Santyl on everything except the toe wounds we have been using silver alginate. No major change this week. She does not describe pain. No nighttime claudication. She is a minimal ambulator but does not describe claudication type Electronic Signature(s) Signed: 03/08/2021 5:13:41 PM By: Linton Ham MD Entered By: Linton Ham on 03/08/2021 14:57:46 -------------------------------------------------------------------------------- Physical Exam Details Patient Name: Date of Service: [name] RA S. 03/08/2021 1:30 PM Medical Record Number: [id number] Patient Account  Number: [id number] Date of Birth/Sex: Treating RN: August 22, 1935 (86 y.o. [name] Primary Care Provider: Scarlette Dudley Other Clinician: Referring Provider: Treating Provider/Extender: [name] in Treatment: 1 Constitutional Sitting or standing Blood Pressure is within target range for patient.. Pulse regular and within target range for patient.Marland Kitchen Respirations regular, non-labored and within target range.. Temperature is normal and within the target range for the patient.Marland Kitchen Appears in no distress. Notes Wound exam; the patient has multiple small punched out areas including the medial right first metatarsal head the left medial malleolus  the dorsal left fifth toe left Achilles heel. Her toes are completely cyanotic. Peripheral pulses are not palpable at the dorsalis pedis or posterior tibial bilaterally I cannot feel her right popliteal although I cannot feel the popliteal on the left. The patient does not appear to be uncomfortable there is no overt infection in any of these areas Electronic Signature(s) Signed: 03/08/2021 5:13:41 PM By: Linton Ham MD Entered By: Linton Ham on 03/08/2021 15:00:02 -------------------------------------------------------------------------------- Physician Orders Details Patient Name: Date of Service: [name] RA S. 03/08/2021 1:30 PM Medical Record Number: [id number] Patient Account Number: [id number] Date of Birth/Sex: Treating RN: [birth year] (86 y.o. 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 - with Dr. Dellia Nims Return A Bathing/ Shower/ Hygiene May shower and wash wound with soap and water. - with dressing changes. Edema Control - Lymphedema / SCD / Other Elevate legs to the level of the heart or above for 30 minutes daily and/or when sitting, a frequency of: - 3-4 times a day throughout the day. Avoid standing for long periods of time. Other Edema Control Orders/Instructions: - do not sleep in recliner with legs down. Wound Treatment Wound #1 - Ankle Wound Laterality: Left, Medial Cleanser: Soap and Water 1 x Per Day/30 Days Discharge Instructions: May shower and wash wound with dial antibacterial soap and water prior to dressing change. Peri-Wound Care: Skin Prep (Generic) 1 x Per Day/30 Days Discharge Instructions: Use skin prep as directed Prim Dressing: Santyl Ointment 1 x Per Day/30 Days ary Discharge Instructions: Apply nickel thick amount to wound bed as instructed Secondary Dressing: Bordered Gauze, 2x3.75 in (Generic) 1 x Per Day/30 Days Discharge Instructions: Apply over primary dressing as directed. Wound #2 - Foot Wound Laterality: Right, Medial Cleanser: Soap and Water 1 x Per Day/30 Days Discharge Instructions: May shower and wash wound with dial antibacterial soap and water prior to dressing change. Peri-Wound Care: Skin Prep (Generic) 1 x Per Day/30 Days Discharge Instructions: Use skin prep as directed Prim Dressing: Santyl Ointment 1 x Per Day/30 Days ary Discharge Instructions: Apply nickel thick amount to wound bed as instructed Secondary Dressing: Bordered Gauze, 2x3.75 in (Generic) 1 x Per Day/30 Days Discharge Instructions: Apply over primary dressing as directed. Wound #3 - T Fifth oe Wound Laterality: Right Cleanser: Soap and Water 1 x Per Day/30 Days Discharge  Instructions: May shower and wash wound with dial antibacterial soap and water prior to dressing change. Prim Dressing: KerraCel Ag Gelling Fiber Dressing, 2x2 in (silver alginate) (Generic) 1 x Per Day/30 Days ary Discharge Instructions: Apply silver alginate to wound bed as instructed Secondary Dressing: Woven Gauze Sponges 2x2 in 1 x Per Day/30 Days Discharge Instructions: Apply over primary dressing as directed. Secured With: Paper Tape, 1x10 (in/yd) 1 x Per Day/30 Days Discharge Instructions: Secure dressing with tape as directed. Wound #4 - Calcaneus Wound Laterality: Left Cleanser: Soap and Water 1 x Per Day/30 Days Discharge Instructions: May shower and wash wound with dial antibacterial soap and water prior to dressing  change. Peri-Wound Care: Skin Prep (Generic) 1 x Per Day/30 Days Discharge Instructions: Use skin prep as directed Prim Dressing: Santyl Ointment 1 x Per Day/30 Days ary Discharge Instructions: Apply nickel thick amount to wound bed as instructed Secondary Dressing: Bordered Gauze, 2x3.75 in (Generic) 1 x Per Day/30 Days Discharge Instructions: Apply over primary dressing as directed. Wound #5 - T Great oe Wound Laterality: Plantar, Left Cleanser: Soap and Water 1 x Per Day/30 Days Discharge Instructions: May shower and wash wound with dial antibacterial soap and water prior to dressing change. Prim Dressing: KerraCel Ag Gelling Fiber Dressing, 2x2 in (silver alginate) (Generic) 1 x Per Day/30 Days ary Discharge Instructions: Apply silver alginate to wound bed as instructed Secondary Dressing: Woven Gauze Sponges 2x2 in 1 x Per Day/30 Days Discharge Instructions: Apply over primary dressing as directed. Secured With: Paper Tape, 1x10 (in/yd) 1 x Per Day/30 Days Discharge Instructions: Secure dressing with tape as directed. Electronic Signature(s) Signed: 03/08/2021 5:13:41 PM By: Linton Ham MD Signed: 03/08/2021 7:11:13 PM By: Susan Hurst RN, BSN Entered  By: [name] on 03/08/2021 14:47:02 -------------------------------------------------------------------------------- Problem List Details Patient Name: Date of Service: [name] RBA RA S. 03/08/2021 1:30 PM Medical Record Number: [id number] Patient Account Number: [id number] Date of Birth/Sex: Treating RN: 03-20-1935 (86 y.o. 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 03/08/2021 1:30 PM Medical Record Number: [id number] Patient Account Number: [id number] Date of Birth/Sex: Treating RN: March 27, 1935 (86 y.o. [name] Primary Care Provider: Scarlette Dudley Other Clinician: Referring Provider: Treating Provider/Extender: [name] in Treatment: 1 Subjective History of Present Illness (HPI) ADMISSION 02/23/2021 Mrs. Corum is an unfortunate 86 year old lady with multiple medical issues. She is here accompanied by her daughter. She tells me that she was in the hospital for heart failure in April. She had a painful area  over the left medial malleolus and a nurse put some form of foam cover on this. When she took the foam cover off when she got home after she was discharged she had a open wound which is very painful. She went to see Dr. Ronnald Ramp of dermatology who put some form of waterproof Band-Aid on this. This has not helped. She was seen by her oncologist who referred her here [Dr. Feng]. The patient states that she is in a lot of pain because of this it hurts at night. During our review by our intake nurse it was noted that she also had a small punched-out area on the right medial first MTP and an area on her dorsal right fifth toe. She does not give a claudication history in fact she says that her legs actually feel better when she is walking. Past medical history she has multiple serious past medical issues including metastatic breast cancer on  an aromatase inhibitor. 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 I reviewed her echocardiogram from 12/09/2020 she had normal ejection fraction but also noted to have severe apical hypertrophy/ASH. Her right ventricular systolic function was severely reduced with severely elevated pulmonary artery pressures. He also has a history of bladder cancer she has had double mastectomy she is on chronic oxygen. For the last year she has had very significant discoloration of her forefeet especially when they are dependent. This is clearly not a lifelong phenomenon ABIs were done in our clinic and surprisingly were quite good with an ABI of 1.06 on the right and 1.04 on the left 03/01/2021; the patient's arterial studies are not until 7/18; I am looking for diagnostics here recognizing that she will not be a candidate for even percutaneous interventions. She has cyanosis of her toes. She arrived in clinic today with a new area on her left Achilles heel and also her plantar left first toe. This is in addition to the right fifth toe dorsally, the  right first met head medially and the left medial ankle 7/6; after discussion today the patient reiterated her reviews that she did not want to undergo arterial studies. She has areas on the left Achilles heel, plantar left first toe, right dorsal foot, right fifth toe. All of these look like ischemic wounds. We have been using Santyl on everything except the toe wounds we have been using silver alginate. No major change this week. She does not describe pain. No nighttime claudication. She is a minimal ambulator but does not describe claudication type Objective Constitutional Sitting or standing Blood Pressure is within target range for patient.. Pulse regular and within target range for patient.Marland Kitchen Respirations regular, non-labored and within target range.. Temperature is normal and within the target range for the patient.Marland Kitchen Appears in no distress. Vitals Time Taken: 1:49 PM, Temperature: 97.7 F, Pulse: 52 bpm, Respiratory Rate: 22 breaths/min, Blood Pressure: 101/69 mmHg. General Notes: Wound exam; the patient has multiple small punched out areas including the medial right first metatarsal head the left medial malleolus the dorsal left fifth toe left Achilles heel. Her toes are completely cyanotic. Peripheral pulses are not palpable at the dorsalis pedis or posterior tibial bilaterally I cannot feel her right popliteal although I cannot feel the popliteal on the left. The patient does not appear to be uncomfortable there is no overt infection in any of these areas Integumentary (Hair, Skin) Wound #1 status is Open. Original cause of wound was Other Lesion. The date acquired was: 12/12/2020. The wound has been in treatment 1 weeks. The wound is located on the Left,Medial Ankle. The wound measures 1cm length x 1.4cm width x 0.1cm depth; 1.1cm^2 area and 0.11cm^3 volume. There is Fat Layer (Subcutaneous Tissue) exposed. There is no tunneling or undermining noted. There is a medium amount of  serosanguineous drainage noted. The wound margin is distinct with the outline attached to the wound base. There is small (1-33%) pink granulation within the wound bed. There is a large (67-100%) amount of necrotic tissue within the wound bed including Adherent Slough. Wound #2 status is Open. Original cause of wound was Other Lesion. The date acquired was: 02/06/2021. The wound has been in treatment 1 weeks. The wound is located on the Right,Medial Foot. The wound measures 0.5cm length x 0.3cm width x 0.1cm depth; 0.118cm^2 area and 0.012cm^3 volume. There is Fat Layer (Subcutaneous Tissue) exposed. There is no tunneling or undermining noted. There is a medium  amount of serosanguineous drainage noted. The wound margin is distinct with the outline attached to the wound base. There is medium (34-66%) pink, pale granulation within the wound bed. There is a medium (34- 66%) amount of necrotic tissue within the wound bed including Adherent Slough. Wound #3 status is Open. Original cause of wound was Other Lesion. The date acquired was: 02/06/2021. The wound has been in treatment 1 weeks. The wound is located on the Right T Fifth. The wound measures 0.3cm length x 0.3cm width x 0.1cm depth; 0.071cm^2 area and 0.007cm^3 volume. There is Fat Layer oe (Subcutaneous Tissue) exposed. There is no tunneling or undermining noted. There is a medium amount of serosanguineous drainage noted. The wound margin is distinct with the outline attached to the wound base. There is large (67-100%) pink granulation within the wound bed. There is no necrotic tissue within the wound bed. Wound #4 status is Open. Original cause of wound was Pressure Injury. The date acquired was: 03/01/2021. The wound has been in treatment 1 weeks. The wound is located on the Left Calcaneus. The wound measures 1cm length x 0.5cm width x 0.1cm depth; 0.393cm^2 area and 0.039cm^3 volume. There is Fat Layer (Subcutaneous Tissue) exposed. There is no  tunneling or undermining noted. There is a medium amount of serosanguineous drainage noted. The wound margin is flat and intact. There is medium (34-66%) red, pink granulation within the wound bed. There is a medium (34-66%) amount of necrotic tissue within the wound bed including Adherent Slough. Wound #5 status is Open. Original cause of wound was Gradually Appeared. The date acquired was: 03/08/2021. The wound is located on the [place]. The wound measures 0.6cm length x 0.5cm width x 0.2cm depth; 0.236cm^2 area and 0.047cm^3 volume. There is Fat Layer (Subcutaneous Tissue) exposed. There is no tunneling or undermining noted. There is a medium amount of serosanguineous drainage noted. The wound margin is distinct with the outline attached to the wound base. There is small (1-33%) red, pink granulation within the wound bed. There is a large (67-100%) amount of necrotic tissue within the wound bed including Adherent Slough. 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 - with Dr. Dellia Nims Bathing/ Shower/ Hygiene: May shower and wash wound with soap and water. - with dressing changes. Edema Control - Lymphedema / SCD / Other: Elevate legs to the level of the heart or above for 30 minutes daily and/or when sitting, a frequency of: - 3-4 times a day throughout the day. Avoid standing for long periods of time. Other Edema Control Orders/Instructions: - do not sleep in recliner with legs down. WOUND #1: - Ankle Wound Laterality: Left, Medial Cleanser: Soap and Water 1 x Per Day/30 Days Discharge Instructions: May shower and  wash wound with dial antibacterial soap and water prior to dressing change. Peri-Wound Care: Skin Prep (Generic) 1 x Per Day/30 Days Discharge Instructions: Use skin prep as directed Prim Dressing: Santyl Ointment 1 x Per Day/30 Days ary Discharge Instructions: Apply nickel thick amount to wound bed as instructed Secondary Dressing: Bordered Gauze, 2x3.75 in (Generic) 1 x Per Day/30 Days Discharge Instructions: Apply over primary dressing as directed. WOUND #2: - Foot Wound Laterality:  Right, Medial Cleanser: Soap and Water 1 x Per Day/30 Days Discharge Instructions: May shower and wash wound with dial antibacterial soap and water prior to dressing change. Peri-Wound Care: Skin Prep (Generic) 1 x Per Day/30 Days Discharge Instructions: Use skin prep as directed Prim Dressing: Santyl Ointment 1 x Per Day/30 Days ary Discharge Instructions: Apply nickel thick amount to wound bed as instructed Secondary Dressing: Bordered Gauze, 2x3.75 in (Generic) 1 x Per Day/30 Days Discharge Instructions: Apply over primary dressing as directed. WOUND #3: - T Fifth Wound Laterality: Right oe Cleanser: Soap and Water 1 x Per Day/30 Days Discharge Instructions: May shower and wash wound with dial antibacterial soap and water prior to dressing change. Prim Dressing: KerraCel Ag Gelling Fiber Dressing, 2x2 in (silver alginate) (Generic) 1 x Per Day/30 Days ary Discharge Instructions: Apply silver alginate to wound bed as instructed Secondary Dressing: Woven Gauze Sponges 2x2 in 1 x Per Day/30 Days Discharge Instructions: Apply over primary dressing as directed. Secured With: Paper T ape, 1x10 (in/yd) 1 x Per Day/30 Days Discharge Instructions: Secure dressing with tape as directed. WOUND #4: - Calcaneus Wound Laterality: Left Cleanser: Soap and Water 1 x Per Day/30 Days Discharge Instructions: May shower and wash wound with dial antibacterial soap and water prior to dressing change. Peri-Wound Care: Skin  Prep (Generic) 1 x Per Day/30 Days Discharge Instructions: Use skin prep as directed Prim Dressing: Santyl Ointment 1 x Per Day/30 Days ary Discharge Instructions: Apply nickel thick amount to wound bed as instructed Secondary Dressing: Bordered Gauze, 2x3.75 in (Generic) 1 x Per Day/30 Days Discharge Instructions: Apply over primary dressing as directed. WOUND #5: - T Great Wound Laterality: Plantar, Left oe Cleanser: Soap and Water 1 x Per Day/30 Days Discharge Instructions: May shower and wash wound with dial antibacterial soap and water prior to dressing change. Prim Dressing: KerraCel Ag Gelling Fiber Dressing, 2x2 in (silver alginate) (Generic) 1 x Per Day/30 Days ary Discharge Instructions: Apply silver alginate to wound bed as instructed Secondary Dressing: Woven Gauze Sponges 2x2 in 1 x Per Day/30 Days Discharge Instructions: Apply over primary dressing as directed. Secured With: Paper Tape, 1x10 (in/yd) 1 x Per Day/30 Days Discharge Instructions: Secure dressing with tape as directed. 1. I have continued with the same dressings which is largely Santyl to most of the wounds except the areas on her toes we have been using silver alginate 2. She does not want to go through with the arterial studies. I was not convinced in any case that she could even undergo a percutaneous intervention. 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 I talked to her that things could deteriorate if this is all arterial. She expressed understanding as did her daughter. 4. I spontaneously brought up the idea of hospice care for their consideration. They seem particularly interested in Dr. Jacalyn Lefevre opinion of this. 5. I will see her back in 2 weeks. I may switch to silver alginate on all of these which is a good palliative dressing. Electronic Signature(s) Signed: 03/08/2021 5:13:41 PM By: Linton Ham MD Entered By:  Linton Ham on 03/08/2021 15:02:12 -------------------------------------------------------------------------------- SuperBill Details Patient Name: Date of Service: [name] RBA RA S. 03/08/2021 Medical Record Number: [id number] Patient Account Number: [id number] Date of Birth/Sex: Treating RN: 1935-03-27 (86 y.o. 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 native arteries of left leg with ulceration of ankle I70.235 Atherosclerosis of native arteries of right leg with ulceration of other part of foot Facility Procedures CPT4 Code: 24268341 Description: 96222 - DEBRIDE W/O ANES NON SELECT Modifier: Quantity: 1 Physician Procedures : CPT4 Code Description Modifier [id number] - WC PHYS LEVEL 4 - EST PT ICD-10 Diagnosis Description 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

## 2021-03-10 DIAGNOSIS — J80 Acute respiratory distress syndrome: Secondary | ICD-10-CM | POA: Diagnosis not present

## 2021-03-10 DIAGNOSIS — R404 Transient alteration of awareness: Secondary | ICD-10-CM | POA: Diagnosis not present

## 2021-03-10 DIAGNOSIS — I499 Cardiac arrhythmia, unspecified: Secondary | ICD-10-CM | POA: Diagnosis not present

## 2021-03-10 DIAGNOSIS — I491 Atrial premature depolarization: Secondary | ICD-10-CM | POA: Diagnosis not present

## 2021-03-20 ENCOUNTER — Encounter (HOSPITAL_COMMUNITY): Payer: Medicare Other

## 2021-03-21 ENCOUNTER — Ambulatory Visit: Payer: Medicare Other | Admitting: Pulmonary Disease

## 2021-03-22 ENCOUNTER — Encounter (HOSPITAL_BASED_OUTPATIENT_CLINIC_OR_DEPARTMENT_OTHER): Payer: Medicare Other | Admitting: Internal Medicine

## 2021-04-03 DIAGNOSIS — 419620001 Death: Secondary | SNOMED CT | POA: Diagnosis not present

## 2021-04-03 DEATH — deceased

## 2021-04-17 ENCOUNTER — Other Ambulatory Visit: Payer: Medicare Other

## 2021-04-17 ENCOUNTER — Ambulatory Visit: Payer: Medicare Other | Admitting: Hematology

## 2021-04-24 ENCOUNTER — Ambulatory Visit: Payer: Medicare Other | Admitting: Cardiology
